# Patient Record
Sex: Male | Born: 1935 | Race: White | Hispanic: No | Marital: Married | Smoking: Former smoker
Health system: Southern US, Community
[De-identification: ages and names within clinical notes are randomized; demographics above are authoritative.]

## PROBLEM LIST (undated history)

## (undated) DIAGNOSIS — J3489 Other specified disorders of nose and nasal sinuses: Secondary | ICD-10-CM

## (undated) DIAGNOSIS — I10 Essential (primary) hypertension: Secondary | ICD-10-CM

## (undated) DIAGNOSIS — F0390 Unspecified dementia without behavioral disturbance: Secondary | ICD-10-CM

## (undated) DIAGNOSIS — K449 Diaphragmatic hernia without obstruction or gangrene: Secondary | ICD-10-CM

## (undated) DIAGNOSIS — D509 Iron deficiency anemia, unspecified: Secondary | ICD-10-CM

## (undated) DIAGNOSIS — E785 Hyperlipidemia, unspecified: Secondary | ICD-10-CM

## (undated) DIAGNOSIS — I639 Cerebral infarction, unspecified: Secondary | ICD-10-CM

## (undated) DIAGNOSIS — I447 Left bundle-branch block, unspecified: Secondary | ICD-10-CM

## (undated) DIAGNOSIS — I951 Orthostatic hypotension: Secondary | ICD-10-CM

## (undated) DIAGNOSIS — I442 Atrioventricular block, complete: Secondary | ICD-10-CM

## (undated) DIAGNOSIS — A770 Spotted fever due to Rickettsia rickettsii: Secondary | ICD-10-CM

## (undated) DIAGNOSIS — I4891 Unspecified atrial fibrillation: Secondary | ICD-10-CM

## (undated) DIAGNOSIS — K219 Gastro-esophageal reflux disease without esophagitis: Secondary | ICD-10-CM

## (undated) DIAGNOSIS — R079 Chest pain, unspecified: Secondary | ICD-10-CM

## (undated) DIAGNOSIS — G4733 Obstructive sleep apnea (adult) (pediatric): Secondary | ICD-10-CM

## (undated) DIAGNOSIS — E119 Type 2 diabetes mellitus without complications: Secondary | ICD-10-CM

## (undated) DIAGNOSIS — C801 Malignant (primary) neoplasm, unspecified: Secondary | ICD-10-CM

## (undated) DIAGNOSIS — D519 Vitamin B12 deficiency anemia, unspecified: Secondary | ICD-10-CM

## (undated) DIAGNOSIS — F419 Anxiety disorder, unspecified: Secondary | ICD-10-CM

## (undated) DIAGNOSIS — Z9989 Dependence on other enabling machines and devices: Secondary | ICD-10-CM

## (undated) DIAGNOSIS — M1990 Unspecified osteoarthritis, unspecified site: Secondary | ICD-10-CM

## (undated) DIAGNOSIS — Z950 Presence of cardiac pacemaker: Secondary | ICD-10-CM

## (undated) DIAGNOSIS — N200 Calculus of kidney: Secondary | ICD-10-CM

## (undated) DIAGNOSIS — Z9981 Dependence on supplemental oxygen: Secondary | ICD-10-CM

## (undated) HISTORY — DX: Iron deficiency anemia, unspecified: D50.9

## (undated) HISTORY — PX: VASECTOMY: SHX75

## (undated) HISTORY — PX: INGUINAL HERNIA REPAIR: SUR1180

## (undated) HISTORY — DX: Calculus of kidney: N20.0

## (undated) HISTORY — PX: CARDIAC CATHETERIZATION: SHX172

## (undated) HISTORY — PX: LUMBAR DISC SURGERY: SHX700

## (undated) HISTORY — DX: Essential (primary) hypertension: I10

## (undated) HISTORY — PX: SKIN CANCER EXCISION: SHX779

## (undated) HISTORY — DX: Cerebral infarction, unspecified: I63.9

## (undated) HISTORY — PX: CATARACT EXTRACTION W/ INTRAOCULAR LENS  IMPLANT, BILATERAL: SHX1307

## (undated) HISTORY — DX: Anxiety disorder, unspecified: F41.9

## (undated) HISTORY — DX: Diaphragmatic hernia without obstruction or gangrene: K44.9

---

## 1998-11-21 ENCOUNTER — Ambulatory Visit (HOSPITAL_COMMUNITY): Admission: RE | Admit: 1998-11-21 | Discharge: 1998-11-21 | Payer: Self-pay | Admitting: *Deleted

## 1999-05-06 ENCOUNTER — Emergency Department (HOSPITAL_COMMUNITY): Admission: EM | Admit: 1999-05-06 | Discharge: 1999-05-06 | Payer: Self-pay | Admitting: Emergency Medicine

## 1999-05-06 ENCOUNTER — Encounter: Payer: Self-pay | Admitting: Emergency Medicine

## 1999-05-10 ENCOUNTER — Encounter: Payer: Self-pay | Admitting: Internal Medicine

## 1999-05-10 ENCOUNTER — Inpatient Hospital Stay (HOSPITAL_COMMUNITY): Admission: EM | Admit: 1999-05-10 | Discharge: 1999-05-13 | Payer: Self-pay | Admitting: Emergency Medicine

## 1999-05-10 ENCOUNTER — Encounter: Payer: Self-pay | Admitting: Emergency Medicine

## 1999-05-15 ENCOUNTER — Encounter: Payer: Self-pay | Admitting: Emergency Medicine

## 1999-05-15 ENCOUNTER — Emergency Department (HOSPITAL_COMMUNITY): Admission: EM | Admit: 1999-05-15 | Discharge: 1999-05-15 | Payer: Self-pay | Admitting: Emergency Medicine

## 1999-05-19 ENCOUNTER — Emergency Department (HOSPITAL_COMMUNITY): Admission: EM | Admit: 1999-05-19 | Discharge: 1999-05-19 | Payer: Self-pay | Admitting: Emergency Medicine

## 2002-01-16 ENCOUNTER — Emergency Department (HOSPITAL_COMMUNITY): Admission: EM | Admit: 2002-01-16 | Discharge: 2002-01-16 | Payer: Self-pay | Admitting: Emergency Medicine

## 2002-01-16 ENCOUNTER — Encounter: Payer: Self-pay | Admitting: Emergency Medicine

## 2002-12-21 ENCOUNTER — Emergency Department (HOSPITAL_COMMUNITY): Admission: EM | Admit: 2002-12-21 | Discharge: 2002-12-21 | Payer: Self-pay | Admitting: Emergency Medicine

## 2002-12-22 ENCOUNTER — Ambulatory Visit (HOSPITAL_COMMUNITY): Admission: RE | Admit: 2002-12-22 | Discharge: 2002-12-22 | Payer: Self-pay | Admitting: Internal Medicine

## 2004-01-24 ENCOUNTER — Inpatient Hospital Stay (HOSPITAL_BASED_OUTPATIENT_CLINIC_OR_DEPARTMENT_OTHER): Admission: RE | Admit: 2004-01-24 | Discharge: 2004-01-24 | Payer: Self-pay | Admitting: Cardiovascular Disease

## 2004-05-10 ENCOUNTER — Encounter: Admission: RE | Admit: 2004-05-10 | Discharge: 2004-05-10 | Payer: Self-pay | Admitting: Internal Medicine

## 2004-06-28 ENCOUNTER — Ambulatory Visit: Payer: Self-pay | Admitting: Internal Medicine

## 2004-06-28 ENCOUNTER — Inpatient Hospital Stay (HOSPITAL_COMMUNITY): Admission: EM | Admit: 2004-06-28 | Discharge: 2004-07-01 | Payer: Self-pay | Admitting: Internal Medicine

## 2004-07-01 ENCOUNTER — Encounter (INDEPENDENT_AMBULATORY_CARE_PROVIDER_SITE_OTHER): Payer: Self-pay | Admitting: Specialist

## 2004-07-10 ENCOUNTER — Ambulatory Visit: Payer: Self-pay | Admitting: Internal Medicine

## 2007-07-09 ENCOUNTER — Encounter: Admission: RE | Admit: 2007-07-09 | Discharge: 2007-07-09 | Payer: Self-pay | Admitting: Sports Medicine

## 2007-08-10 ENCOUNTER — Other Ambulatory Visit: Admission: RE | Admit: 2007-08-10 | Discharge: 2007-08-10 | Payer: Self-pay | Admitting: Otolaryngology

## 2007-08-10 ENCOUNTER — Encounter: Admission: RE | Admit: 2007-08-10 | Discharge: 2007-08-10 | Payer: Self-pay | Admitting: Otolaryngology

## 2007-10-21 ENCOUNTER — Ambulatory Visit (HOSPITAL_COMMUNITY): Admission: RE | Admit: 2007-10-21 | Discharge: 2007-10-21 | Payer: Self-pay | Admitting: Internal Medicine

## 2009-05-24 ENCOUNTER — Ambulatory Visit (HOSPITAL_COMMUNITY): Admission: RE | Admit: 2009-05-24 | Discharge: 2009-05-24 | Payer: Self-pay | Admitting: Ophthalmology

## 2009-08-30 ENCOUNTER — Ambulatory Visit (HOSPITAL_COMMUNITY): Admission: RE | Admit: 2009-08-30 | Discharge: 2009-08-30 | Payer: Self-pay | Admitting: Ophthalmology

## 2010-06-21 ENCOUNTER — Encounter: Admission: RE | Admit: 2010-06-21 | Discharge: 2010-06-21 | Payer: Self-pay | Admitting: Neurosurgery

## 2010-11-10 LAB — BASIC METABOLIC PANEL
CO2: 30 mEq/L (ref 19–32)
Creatinine, Ser: 1.16 mg/dL (ref 0.4–1.5)
GFR calc non Af Amer: >60 mL/min (ref >60)
Sodium: 137 mEq/L (ref 135–145)

## 2010-11-10 LAB — GLUCOSE, CAPILLARY: Glucose-Capillary: 124 mg/dL — ABNORMAL HIGH (ref 70–99)

## 2010-11-10 LAB — HEMOGLOBIN AND HEMATOCRIT, BLOOD: Hemoglobin: 12.3 g/dL — ABNORMAL LOW (ref 13.0–17.0)

## 2010-11-29 LAB — BASIC METABOLIC PANEL
Calcium: 9.3 mg/dL (ref 8.4–10.5)
GFR calc Af Amer: >60 mL/min (ref >60)
Glucose, Bld: 146 mg/dL — ABNORMAL HIGH (ref 70–99)
Sodium: 139 mEq/L (ref 135–145)

## 2010-11-29 LAB — GLUCOSE, CAPILLARY: Glucose-Capillary: 126 mg/dL — ABNORMAL HIGH (ref 70–99)

## 2010-11-29 LAB — HEMOGLOBIN AND HEMATOCRIT, BLOOD: Hemoglobin: 11.7 g/dL — ABNORMAL LOW (ref 13.0–17.0)

## 2011-01-10 NOTE — H&P (Signed)
NAME:  Richard [name], Richard [name] NO.:  [id number]   MEDICAL RECORD NO.:  [id number]                   PATIENT TYPE:  OIB   LOCATION:  NA                                   FACILITY:  [facility]   PHYSICIAN:  Vesta Mixer, M.D.              DATE OF BIRTH:  1931/06/28   DATE OF ADMISSION:  DATE OF DISCHARGE:                                HISTORY & PHYSICAL   HISTORY OF PRESENT ILLNESS:  Mr. Shaff is a 77 year old gentleman who we  are asked to see for further evaluation of some chest pain.   Mr. Hoge has had some chest pain for the past several years.  These  episodes of chest pain have been gradually worsening.  He describes a left-  sided chest heaviness with radiation to the left arm and left shoulder.  He  describes several weeks or several months of decreased energy.  He is not  able to do any specific exercises at this point because of chest pain,  shortness of breath, and severe fatigue.  These episodes of pain last for  many hours a day.  His wife states that he is no longer able to do any of  his normal activities.  He has not had any episodes of syncope.   CURRENT MEDICATIONS:  1. Actos 30 mg a day.  2. Valium 5 mg as needed.  3. Celexa 20 mg a day.  4. Glucovance 5 mg/500 mg once a day.  5. Lasix 40 mg a day.  6. Potassium chloride 20 mEq a day.  7. Lotensin 40 mg a day.  8. Prilosec 20 mg a day.  9. Norvasc 10 mg a day.   ALLERGIES:  He has no known drug allergies.   PAST MEDICAL HISTORY:  1. Diabetes mellitus.  2. Shortness of breath.  3. Hypertension.  4. Hiatal hernia.  5. Anxiety.   PAST SURGICAL HISTORY:  1. History of back surgery.  2. Hernia repair.  3. Vasectomy.   SOCIAL HISTORY:  The patient is retired from [employer] [address]  He  smoked a pack a day for 20 years, but quit approximately 25 years ago.  He  does not drink any alcohol.   FAMILY HISTORY:  His father died at age 67 due to old age.  His mother died  at age 34 due to old age.   REVIEW OF SYSTEMS:  His review of systems was reviewed and is essentially  negative.   PHYSICAL EXAMINATION:  GENERAL APPEARANCE:  He is an elderly gentleman in no  acute distress.  He is alert and oriented x 3.  His mood and affect are  normal.  WEIGHT:  167 pounds.  VITAL SIGNS:  His blood pressure is 120/80 with a heart rate of 88.  NECK:  2+ carotids.  He has no bruits.  There is no JVD and no thyromegaly.  LUNGS:  Clear to  auscultation.  HEART:  Regular rate S1 and S2.  He has no murmurs, rubs, or gallops.  ABDOMEN:  Good bowel sounds.  Nontender.  EXTREMITIES:  He has no cyanosis, clubbing, or edema.  NEUROLOGIC:  Exam was nonfocal.   LABORATORY DATA:  His EKG reveals normal sinus rhythm.  There are no acute  ST-T wave changes.   IMPRESSION:  Mr. Rigor presents with symptoms that are very consistent  with unstable angina.  I have recommended that we proceed with heart  catheterization.  We have discussed the risks, benefits, and options of  heart catheterization.  He understands and agrees to proceed.                                                Vesta Mixer, M.D.    PJN/MEDQ  D:  01/23/2004  T:  01/23/2004  Job:  [id number]   cc:   Larina Earthly, M.D.  [address]  [place]  [place] [address]  Fax: [phone]

## 2011-01-10 NOTE — Cardiovascular Report (Signed)
NAME:  Richard [name], Richard [name] NO.:  [id number]   MEDICAL RECORD NO.:  [id number]                   PATIENT TYPE:  OIB   LOCATION:  6501                                 FACILITY:  [facility]   PHYSICIAN:  Vesta Mixer, M.D.              DATE OF BIRTH:  [birth year]   DATE OF PROCEDURE:  01/24/2004  DATE OF DISCHARGE:                              CARDIAC CATHETERIZATION   [name] is a 77 year old gentleman with a history of hypertension and  diabetes mellitus.  He presented with classic symptoms of unstable angina.  He was referred for heart catheterization for further evaluation.   PROCEDURE:  Left heart catheterization with coronary angiography.  The right  femoral artery was easily cannulated using a modified Seldinger technique.   HEMODYNAMICS:  LV pressure was 123/11 with an aortic pressure of 123/73.   ANGIOGRAPHY:  1. Left main.  The left main is relatively smooth and normal.  2. LAD.  The left anterior descending artery has minor luminal     irregularities.  There are no stenoses greater than 10-20%.  There are     several small diagonal branches.  3. The ramus intermediate branch is large and normal.  4. The left circumflex artery is a fairly large vessel.  It supplies the     posterolateral branch which is essentially normal.  5. The right coronary artery is very large and is normal.  There are a few     minor luminal irregularities.  The posterior descending artery and the     posterolateral segment artery are unremarkable.  6. The left ventriculogram was performed in a 30 RAO position.  It reveals     overall normal left ventricular systolic function.  Ejection fraction is     approximately 65%.  There is no mitral regurgitation.   COMPLICATIONS:  None.   CONCLUSIONS:  1. Minimal coronary artery irregularities.  2. Normal left ventricular systolic function.  We will continue with a good     diet and exercise program.                             Vesta Mixer, M.D.    PJN/MEDQ  D:  01/24/2004  T:  01/24/2004  Job:  [id number]   cc:   Larina Earthly, M.D.  [address]  [place]  [place] [address]  Fax: [phone]

## 2011-01-10 NOTE — H&P (Signed)
NAMEGABRIELA, GIANNELLI NO.:  [id number]   MEDICAL RECORD NO.:  [id number]          PATIENT TYPE:  INP   LOCATION:  0474                         FACILITY:  [facility]   PHYSICIAN:  Iva Boop, M.D. LHCDATE OF BIRTH:  Jun 09, 1931   DATE OF ADMISSION:  06/28/2004  DATE OF DISCHARGE:                                HISTORY & PHYSICAL   CHIEF COMPLAINT:  Shortness of breath and anemia.   HISTORY:  Mr. Faulkenberry is a pleasant 77 year old white male who was found to  be anemic recently.  He has been complaining of progressive fatigue and  dyspnea on exertion to his primary care physician, Dr. Larina Earthly.  On  June 24, 2004, he had a hemoglobin of 8.5 with an MCV of 75, hematocrit  27.6.  He has been found to have Hemoccult-positive stools.  A while ago, he  thinks he had some melena but not lately.  That was perhaps several weeks  ago.  I have reviewed the office records from Dr. Felipa Eth and have spoken to  the patient as well.  The patient has complained of some gas and bloating.  He has weakness and severe shortness of breath.  He was given the option of  trying some iron versus a transfusion, and he was concerned about the risks  of transfusion and opted for iron the other day; however, he continues to do  poorly, feels worse, and has some lightheadedness upon arising from a  sitting or lying position.  He had been on some p.r.n. diclofenac.  He had  been taking aspirin a day, but he has held those.  He was on Nexium and had  that increase from 1 to twice daily recently.  Other recent problems include  an MRI of the lumbar spine that was showing laminectomy changes at L2-3 and  L3-4, and an asymmetric posterior annular bulge and spur formation at L5-S1  and abutting and descending left S1 root as well as flattening of the thecal  sac.  I think he is being considered for possible neurosurgical evaluation.  The patient denies nausea, vomiting, or dysphagia.  His  gastrointestinal  history is pertinent for a known large hiatal hernia.  His last upper  endoscopy was by Dr. Sabino Gasser in 2000 that demonstrated changes that were  apparently consistent with Barrett's esophagus, although I do not have  pathology results on that.  His primary gastroenterologist through the years  has generally been Dr. Victorino Dike.  He had performed upper endoscopy and  colonoscopy in 1996 that demonstrated a large hiatal hernia and some  gastritis and esophagitis changes as well as a colonoscopy with diminutive  polyps, diverticulosis, and hemorrhoids.  There is a remote history of  bleeding that was thought to be due to hemorrhoids.  He does have a  redundant colon.  The patient's appetite has been okay, and there are no  other significant constitutional symptoms other than those things mentioned  above.   MEDICATIONS:  1.  Actos 30 mg q.d.  2.  Glyburide 5/500 mg q.d.  3.  Diazepam 5 mg  q.d.  4.  Citalopram 20 mg q.d.  5.  Benazepril HCl 40 mg q.d.  6.  Norvasc 10 mg q.d.  7.  Nexium 40 mg b.i.d.  8.  Aspirin 81 mg q.d., on hold.  9.  Ferrex 150 mg q.d.  10. Vitamin B12 injection once a month.  11. Diclofenac 75 mg p.r.n., not using.  12. Lasix 40 mg p.r.n.  13. Potassium p.r.n.   DRUG ALLERGIES:  None known.   PAST MEDICAL HISTORY:  1.  Diabetes mellitus type 2.  2.  Hypertension.  3.  Anxiety.  4.  Nephrolithiasis.  5.  Sleep apnea.  6.  Hernia repair.  7.  Lumbar laminectomy.  8.  Cardiac catheterization by Dr. Elease Hashimoto, January 24, 2004, that shows minimal      coronary artery irregularities and normal left ventricular function.  9.  History of vasectomy.   SOCIAL HISTORY:  The patient is retired from [employer].  Prior smoker.  Quit many years ago.  No alcohol reported.  He is here with  his wife today.  One son and one daughter.   REVIEW OF SYSTEMS:  Eye glasses.  Pedal edema.  A lot of fatigue.  Difficulty hearing.  Back pain.   Otherwise as above.  All other systems are  negative otherwise.   PHYSICAL EXAMINATION:  VITAL SIGNS:  Height 6 feet 4.  Weight 268 pounds.  Blood pressure 128/60, pulse 100 and regular.  GENERAL:  A pleasant elderly white male who looks mildly ill.  He is in no  acute distress.  HEENT:  The eyes show pale conjunctivae, otherwise anicteric.  The mouth and  posterior pharynx are free of lesions, although the mucosa is a bit pale.  NECK:  Supple without thyromegaly or mass.  LUNGS:  Clear.  HEART:  S1 and S2.  I hear no murmurs, rubs or gallops.  I see no jugular  venous distention.  ABDOMEN:  Soft, nontender without organomegaly or mass.  RECTAL:  Brown, Hemoccult-negative stool.  GU:  Prostate smooth.  Not enlarged.  LYMPH NODES:  No neck or supraclavicular nodes.  EXTREMITIES:  Trace to 1+ peripheral edema in the bilateral pretibial areas.  SKIN:  Signs of aging.  Pale.  No acute rash.  NEUROLOGIC:  He is alert and oriented x 3.  Cranial nerves II-XII grossly  intact.   ASSESSMENT:  This man has severe symptomatic anemia, Hemoccult positive  stool, and a microcytic anemia as well that must be iron deficiency.  Possibilities include his large hiatal hernia and Cameron's erosions, occult  gastrointestinal neoplasm certainly is a possibility as well.  He has other  comorbidities that include diabetes mellitus and hypertension.   PLAN:  Because of his symptoms, (he became weak and dizzy upon arising in  the clinic today).  I think he needs transfusion.  I do not think iron will  take care of this problem.  Thus, I am going to admit him for transfusion  and probable endoscopy and colonoscopy in the next couple of days, depending  on how it works out.  I have explained the risks, benefits and indications  of transfusion, to include AIDS, hepatitis risk, and transfusion reactions.  We will type and cross him for 2 units, admit him to my service.  Dr. Yancey Flemings is  covering over the  weekend for [place].  Further plans pending above.  I will hold  his blood pressure medicines for the time being.  I appreciate the opportunity to care for this patient.      CEG/MEDQ  D:  06/28/2004  T:  06/28/2004  Job:  [id number]   cc:   Larina Earthly, M.D.  [address]  [place]  [place] [address]  Fax: [phone]

## 2011-01-10 NOTE — Discharge Summary (Signed)
NAME:  Richard [name], Richard [name] NO.:  [id number]   MEDICAL RECORD NO.:  [id number]          PATIENT TYPE:  INP   LOCATION:  0474                         FACILITY:  [facility]   PHYSICIAN:  Richard [name], M.D. LHCDATE OF BIRTH:  03-30-1931   DATE OF ADMISSION:  06/28/2004  DATE OF DISCHARGE:  07/01/2004                                 DISCHARGE SUMMARY   ADMISSION DIAGNOSES:  28.  A 77 year old male with shortness of breath and fatigue secondary to      severe anemia.  2.  Hemoccult-positive stool.  3.  Adult-onset diabetes mellitus.  4.  Hypertension.  5.  History of sleep apnea.  6.  Status post hernia repair and lumbar laminectomy.   DISCHARGE DIAGNOSES:  1.  Severe microcytic anemia, improved.  Status post transfusions.  2.  Large hiatal hernia, 10 cm, with no evidence of Cameron erosions, though      possible that he has had erosions responsible for his anemia.  3.  Sigmoid diverticulosis, colon polyps, status post polypectomies.  4.  Small hemorrhoids.  5.  Gastric antral polyp.  6.  Distal esophageal stricture, status post dilation.  7.  Other problems as outlined above.   CONSULTATIONS:  None.   PROCEDURE:  Upper endoscopy with biopsy and colonoscopy with polypectomies.   BRIEF HISTORY:  Richard [name] is a pleasant 77 year old white male, found  recently to be anemic by his primary care physician, Richard [name].  He at  this time has been complaining of progressive fatigue and shortness of  breath on exertion.  On October 31, he had a hemoglobin of 8.5, MCV 75, and  Hemoccult-positive stool.  In retrospect, he thinks that he may have had  some melena a couple of weeks ago but not any in the past several days.  He  was seen by Richard [name] and given the option of trying some iron  supplementation versus transfusions.  Had been initially concerned about the  risks of transfusions and opted for iron; however, in the interim, he  continues to feel poorly and has been  feeling somewhat worse with  lightheadedness on arising and severe fatigue.  He had called back, and at  this time, arrangements were made for admission for transfusions and then  further diagnostic evaluation, to include colonoscopy and endoscopy.   LABORATORY STUDIES:  On June 28, 2004, WBC 4.6, hemoglobin 7.7,  hematocrit 23.7, MCV 76, platelets 268.  Serial values were obtained post  transfusion on November 7:  Hemoglobin up to 9.4, hematocrit 29.1.  Electrolytes within normal limits.  BUN 17, creatinine 1.3.  Colon biopsies:  A polyp at the appendiceal orifice, hyperplastic polyp, rectosigmoid polyp,  hyperplastic.  Gastric polyp, hyperplastic.   HOSPITAL COURSE:  Patient was admitted to the service of Richard [name].  His hemoglobin was 7.7 on admission, and he was transfused 2 units of packed  RBCs, which did improve his symptoms.  He was then scheduled for colonoscopy  and upper endoscopy with Richard [name] on the 7th.  He had colon prep on the  6th and tolerated this  without difficulty.   Colonoscopy showed several colon polyps, all of which were removed, and path  is consistent with hyperplastic polyps.  He had grade 1 internal hemorrhoids  and sigmoid colon diverticulosis.  Upper endoscopy showed a huge hiatal  hernia, approximately 10 cm.  There were no Cameron erosions seen.  He had a  12 mm firm polypoid lesion with erythematous mucosa in the gastric antrum.  This was biopsied.  He had a distal esophageal stricture, which was dilated  to 18 mm.  He was feeling well post procedures and was stable for discharge  to home.  Though no Cameron erosions were seen, it was felt that with his  huge hiatal hernia, this certainly could be a potential source for slow GI  blood loss.   He was placed on iron supplement, Ferrex t.i.d.  He was to avoid all aspirin  and anti-inflammatories post polypectomy for two weeks.  He was changed to  Nexium 40 mg q.d. and was to continue his  other regular medications,  including Actos, Glyburide, Diazepam, Acetazolam, Benazepril, Norvasc, EPO  injections monthly, Lasix 40 p.r.n., and potassium p.r.n.   He is to follow up with Richard [name] on November 16th at 9 a.m. and to call  for any problems in the interim.   DISCHARGE DIET:  Diabetic with low roughage.   CONDITION ON DISCHARGE:  Stable and improved.      AE/MEDQ  D:  07/25/2004  T:  07/25/2004  Job:  [id number]   cc:   Larina [name], M.D.  [address]  [place]  [place] [address]  Fax: [phone]

## 2011-06-06 ENCOUNTER — Emergency Department (HOSPITAL_COMMUNITY): Payer: Medicare Other

## 2011-06-06 ENCOUNTER — Emergency Department (HOSPITAL_COMMUNITY)
Admission: EM | Admit: 2011-06-06 | Discharge: 2011-06-06 | Disposition: A | Discharge status: Home / Self Care | Payer: Medicare Other | Attending: Emergency Medicine | Admitting: Emergency Medicine

## 2011-06-06 DIAGNOSIS — I10 Essential (primary) hypertension: Secondary | ICD-10-CM | POA: Insufficient documentation

## 2011-06-06 DIAGNOSIS — M549 Dorsalgia, unspecified: Secondary | ICD-10-CM | POA: Insufficient documentation

## 2011-06-06 DIAGNOSIS — K219 Gastro-esophageal reflux disease without esophagitis: Secondary | ICD-10-CM | POA: Insufficient documentation

## 2011-06-06 DIAGNOSIS — R072 Precordial pain: Secondary | ICD-10-CM | POA: Insufficient documentation

## 2011-06-06 DIAGNOSIS — I447 Left bundle-branch block, unspecified: Secondary | ICD-10-CM | POA: Insufficient documentation

## 2011-06-06 DIAGNOSIS — K449 Diaphragmatic hernia without obstruction or gangrene: Secondary | ICD-10-CM | POA: Insufficient documentation

## 2011-06-06 DIAGNOSIS — E119 Type 2 diabetes mellitus without complications: Secondary | ICD-10-CM | POA: Insufficient documentation

## 2011-06-06 DIAGNOSIS — R0602 Shortness of breath: Secondary | ICD-10-CM | POA: Insufficient documentation

## 2011-06-06 DIAGNOSIS — I2510 Atherosclerotic heart disease of native coronary artery without angina pectoris: Secondary | ICD-10-CM | POA: Insufficient documentation

## 2011-06-06 LAB — POCT I-STAT TROPONIN I

## 2011-06-06 LAB — BASIC METABOLIC PANEL
CO2: 27 mEq/L (ref 19–32)
Glucose, Bld: 128 mg/dL — ABNORMAL HIGH (ref 70–99)
Potassium: 4.6 mEq/L (ref 3.5–5.1)
Sodium: 133 mEq/L — ABNORMAL LOW (ref 135–145)

## 2011-06-06 LAB — GLUCOSE, CAPILLARY

## 2011-06-06 LAB — DIFFERENTIAL
Basophils Absolute: 0 10*3/uL (ref 0.0–0.1)
Basophils Relative: 1 % (ref 0–1)
Lymphocytes Relative: 24 % (ref 12–46)
Lymphs Abs: 1 10*3/uL (ref 0.7–4.0)
Monocytes Absolute: 0.5 10*3/uL (ref 0.1–1.0)

## 2011-06-06 LAB — PROTIME-INR: INR: 1 (ref 0.00–1.49)

## 2011-06-06 LAB — CBC
Hemoglobin: 11.3 g/dL — ABNORMAL LOW (ref 13.0–17.0)
RBC: 3.89 MIL/uL — ABNORMAL LOW (ref 4.22–5.81)
WBC: 4.1 10*3/uL (ref 4.0–10.5)

## 2011-06-06 LAB — APTT: aPTT: 28 seconds (ref 24–37)

## 2011-06-06 MED ORDER — IOHEXOL 300 MG/ML  SOLN
100.0000 mL | Freq: Once | INTRAMUSCULAR | Status: AC | PRN
  Administered 2011-06-06: 100 mL via INTRAVENOUS

## 2011-06-11 ENCOUNTER — Telehealth: Payer: Self-pay | Admitting: Cardiovascular Disease

## 2011-06-11 NOTE — Telephone Encounter (Signed)
Pt wife calls to make post ED follow-up appt with Dr. Elease Hashimoto.  He was seen last Friday in the [facility] ED with c/o chest pain & arm pain. His wife states he "checked out negative and was clear to go on vacation but, Dr. Felipa Eth said he should be seen soon with Dr. Elease Hashimoto". They are back from vacation.  Appt  Made with Dr. Elease Hashimoto for this Friday. Wife is aware. Mylo Red RN

## 2011-06-11 NOTE — Telephone Encounter (Signed)
Pt was having chest pain and arm pain last Friday and they wanted him to schedule appt with dr Elease Hashimoto. They went on vacation and cam back today Now they want to schedule appt as soon as possible please call

## 2011-06-12 ENCOUNTER — Encounter: Payer: Self-pay | Admitting: Cardiovascular Disease

## 2011-06-12 ENCOUNTER — Encounter: Payer: Self-pay | Admitting: *Deleted

## 2011-06-13 ENCOUNTER — Encounter: Payer: Self-pay | Admitting: Cardiovascular Disease

## 2011-06-13 ENCOUNTER — Ambulatory Visit (INDEPENDENT_AMBULATORY_CARE_PROVIDER_SITE_OTHER): Payer: Medicare Other | Admitting: Cardiovascular Disease

## 2011-06-13 DIAGNOSIS — G4730 Sleep apnea, unspecified: Secondary | ICD-10-CM

## 2011-06-13 DIAGNOSIS — G4733 Obstructive sleep apnea (adult) (pediatric): Secondary | ICD-10-CM | POA: Insufficient documentation

## 2011-06-13 DIAGNOSIS — R079 Chest pain, unspecified: Secondary | ICD-10-CM

## 2011-06-13 DIAGNOSIS — I447 Left bundle-branch block, unspecified: Secondary | ICD-10-CM

## 2011-06-13 DIAGNOSIS — I10 Essential (primary) hypertension: Secondary | ICD-10-CM | POA: Insufficient documentation

## 2011-06-13 NOTE — Patient Instructions (Addendum)
Your physician wants you to follow-up in: 6 months You will receive a reminder letter in the mail two months in advance. If you don't receive a letter, please call our office to schedule the follow-up appointment.  Your physician has requested that you have a lexiscan myoview. For further information please visit [email or web address] Please follow instruction sheet, as given.2 DAY PROTICOL

## 2011-06-13 NOTE — Progress Notes (Signed)
Fuller Plan Date of Birth  [birth year] [facility] [address]    [address] [place], [place]  [address] [phone]  Fax  [phone]  History of Present Illness:  Richard [name] is a 77 year old old gentleman with a history of left bundle branch block. He has a history of hypertension. He's had some episodes of chest pain. He was recently seen in the emergency room with episodes of chest pain. He had multiple troponin levels which were normal. He has CT angiogram which was negative for a PE. He was not have a very large hiatal hernia.  He had a normal cardiac catheterization in 2005. He had a normal stress Myoview study in 2009.  Current Outpatient Prescriptions on File Prior to Visit  Medication Sig Dispense Refill  . amLODipine (NORVASC) 5 MG tablet Take 5 mg by mouth daily.        . benazepril (LOTENSIN) 40 MG tablet Take 40 mg by mouth daily.        . carvedilol (COREG) 25 MG tablet Take 25 mg by mouth 2 (two) times daily with a meal.        . Cyanocobalamin (VITAMIN B-12 IJ) Inject as directed every 30 (thirty) days.        . diazepam (VALIUM) 5 MG tablet Take 5 mg by mouth at bedtime.        . fluticasone (FLONASE) 50 MCG/ACT nasal spray Place 1 spray into the nose daily.        Marland Kitchen METFORMIN HCL PO Take 50 mg by mouth 2 (two) times daily.        Marland Kitchen omeprazole (PRILOSEC) 20 MG capsule Take 20 mg by mouth daily.        . pioglitazone (ACTOS) 30 MG tablet Take 30 mg by mouth daily.          Allergies  Allergen Reactions  . Codeine     Past Medical History  Diagnosis Date  . Diabetes mellitus     Type 2  . Hypertension   . Anxiety   . Nephrolithiasis   . Sleep apnea   . Microcytic anemia     as well that must be iron deficiency  . Chest pain   . Hiatal hernia     Past Surgical History  Procedure Date  . Hernia repair   . Cardiac catheterization     by Dr. Elease Hashimoto, January 24, 2004, that shows minimal coronary artery irregularities and normal left ventricular  function  . Vasectomy     Hx of     History  Smoking status  . Former Smoker  Smokeless tobacco  . Not on file    History  Alcohol Use No    History reviewed. No pertinent family history.  Reviw of Systems:  Reviewed in the HPI.  All other systems are negative.  Physical Exam: BP 123/72  Pulse 64  Ht 6\' 4"  (1.93 m)  Wt 267 lb 6.4 oz (121.292 kg)  BMI 32.55 kg/m2 The patient is alert and oriented x 3.  The mood and affect are normal.   Skin: warm and dry.  Color is normal.    HEENT:   Normal carotids, no JVD  Lungs: clear   Heart: RR, no murmur    Abdomen: nt. Normal BS  Extremities:  Trace edema in Right leg, minimal in left.  He has a moderate size hematoma in his left lower leg. He developed this after a fall.  Neuro:  Non focal  ECG: Normal sinus rhythm. Left bundle branch block.  Assessment / Plan:

## 2011-06-13 NOTE — Assessment & Plan Note (Signed)
He presented to the emergency room several weeks ago with severe episodes of chest pain. The pain was also intrascapular. It caused him to be short of breath. He had a negative CT angiogram which revealed no evidence of pulmonary and was.  He's had a normal heart catheterization approximately 7 years ago and has had a normal Myoview study 2 years ago.  I think that we should repeat his Myoview study as a two-day Lexiscan study. I'll see him in followup in 6 months.

## 2011-06-13 NOTE — Assessment & Plan Note (Signed)
His blood pressure is fairly well controlled. He has a history of sleep apnea and obesity. I've asked him to watch his diet and exercise as much as possible. He will continue to wear his sleep mask.

## 2011-06-19 ENCOUNTER — Other Ambulatory Visit: Payer: Self-pay | Admitting: Internal Medicine

## 2011-06-19 DIAGNOSIS — R0602 Shortness of breath: Secondary | ICD-10-CM

## 2011-06-20 ENCOUNTER — Ambulatory Visit
Admission: RE | Admit: 2011-06-20 | Discharge: 2011-06-20 | Disposition: A | Discharge status: Home / Self Care | Payer: No Typology Code available for payment source | Source: Ambulatory Visit | Attending: Internal Medicine | Admitting: Internal Medicine

## 2011-06-20 ENCOUNTER — Ambulatory Visit
Admission: RE | Admit: 2011-06-20 | Discharge: 2011-06-20 | Disposition: A | Discharge status: Home / Self Care | Payer: Medicare Other | Source: Ambulatory Visit | Attending: Internal Medicine | Admitting: Internal Medicine

## 2011-06-20 DIAGNOSIS — R0602 Shortness of breath: Secondary | ICD-10-CM

## 2011-06-24 ENCOUNTER — Other Ambulatory Visit (HOSPITAL_COMMUNITY): Payer: Self-pay | Admitting: Radiology

## 2011-06-24 DIAGNOSIS — I447 Left bundle-branch block, unspecified: Secondary | ICD-10-CM

## 2011-06-24 DIAGNOSIS — R079 Chest pain, unspecified: Secondary | ICD-10-CM

## 2011-06-25 ENCOUNTER — Ambulatory Visit (HOSPITAL_COMMUNITY): Payer: Medicare Other | Attending: Cardiovascular Disease | Admitting: Radiology

## 2011-06-25 ENCOUNTER — Encounter: Payer: Self-pay | Admitting: Cardiology

## 2011-06-25 DIAGNOSIS — R079 Chest pain, unspecified: Secondary | ICD-10-CM | POA: Insufficient documentation

## 2011-06-25 DIAGNOSIS — I447 Left bundle-branch block, unspecified: Secondary | ICD-10-CM | POA: Insufficient documentation

## 2011-06-25 DIAGNOSIS — I4949 Other premature depolarization: Secondary | ICD-10-CM

## 2011-06-25 DIAGNOSIS — R0789 Other chest pain: Secondary | ICD-10-CM

## 2011-06-25 MED ORDER — REGADENOSON 0.4 MG/5ML IV SOLN
0.4000 mg | Freq: Once | INTRAVENOUS | Status: AC
  Administered 2011-06-25: 0.4 mg via INTRAVENOUS

## 2011-06-25 MED ORDER — TECHNETIUM TC 99M TETROFOSMIN IV KIT
33.0000 | PACK | Freq: Once | INTRAVENOUS | Status: AC | PRN
  Administered 2011-06-25: 33 via INTRAVENOUS

## 2011-06-25 NOTE — Progress Notes (Signed)
[facility] SITE 3 NUCLEAR MED [address] [place] [address] [phone]  Cardiology Nuclear Med Study  Richard [name] is a 77 y.o. male [id number] [birth year]   Nuclear Med Background Indication for Stress Test:  Evaluation for Ischemia and 06/06/11 Post Hospital:Chest Pain, (-) enzymes History: 12/09 Echo:EF 60-65%, '05 Heart Catheterization: N/O Dz EF: 65% and '09 Myocardial Perfusion Study:'09 NL EF 58% Cardiac Risk Factors: History of Smoking, Hypertension, LBBB and NIDDM  Symptoms:  Chest Pain, Chest Pressure, Chest Tightness, and Palpitations   Nuclear Pre-Procedure Caffeine/Decaff Intake:  None NPO After: 2:00am   Lungs:  clear IV 0.9% NS with Angio Cath:  20g  IV Site: R Antecubital x 1, tolerated well IV Started by:  Irean Hong, RN  Chest Size (in):  52 Cup Size: n/a  Height: 6\' 4"  (1.93 m)  Weight:  265 lb (120.203 kg)  BMI:  Body mass index is 32.26 kg/(m^2). Tech Comments:  N/A    Nuclear Med Study 1 or 2 day study: 2 day  Stress Test Type:  Adenosine  Reading MD: Arvilla Meres, MD  Order Authorizing Provider:  Kristeen Miss, MD  Resting Radionuclide: Technetium 39m Tetrofosmin  Resting Radionuclide Dose: 33.0 mCi   Stress Radionuclide:  Technetium 87m Tetrofosmin  Stress Radionuclide Dose: 33.0 mCi           Stress Protocol Rest HR: 60 Stress HR: 81  Rest BP: 120/69 Stress BP: 137/70  Exercise Time (min): n/a METS: n/a   Predicted Max HR: 142 bpm % Max HR: 57.04 bpm Rate Pressure Product: 72536   Dose of Adenosine (mg):  60.0 Dose of Lexiscan: n/a mg  Dose of Atropine (mg): n/a Dose of Dobutamine: n/a mcg/kg/min (at max HR)  Stress Test Technologist: Milana Na, EMT-P  Nuclear Technologist:  Domenic Polite, CNMT     Rest Procedure:  Myocardial perfusion imaging was performed at rest 45 minutes following the intravenous administration of Technetium 50m Tetrofosmin. Rest ECG: NSR  Stress Procedure:  The patient  received IV adenosine at 140 mcg/kg/min for 4 minutes.  There were no significant changes, + chest tightness, abdominal tightness, and rare pvcs with infusion.  Technetium 69m Tetrofosmin was injected at the 2 minute mark and quantitative spect images were obtained after a 45 minute delay. Stress ECG: Uninteretable due to baseline LBBB  QPS Raw Data Images:  There is a large area of intense tracer uptake either in the upper left chest behind the heart.  Stress Images:  Normal homogeneous uptake in all areas of the myocardium. Rest Images:  Normal homogeneous uptake in all areas of the myocardium. Subtraction (SDS):  Normal Transient Ischemic Dilatation (Normal <1.22):  1.04 Lung/Heart Ratio (Normal <0.45):  0.39  Quantitative Gated Spect Images QGS EDV:  131 ml QGS ESV:  53 ml QGS cine images:  NL LV Function; NL Wall Motion QGS EF: 60%  Impression Exercise Capacity:  Adenosine study with no exercise. BP Response:  n/a Clinical Symptoms:  n/a ECG Impression:  Baseline:  LBBB.  EKG uninterpretable due to LBBB at rest and stress. Comparison with Prior Nuclear Study: No images to compare  Overall Impression:  Normal stress nuclear study. There is a large area of intense tracer uptake either in the upper left chest behind the heart of uncertain significance. Consider chest imaging       Arvilla Meres

## 2011-07-01 ENCOUNTER — Ambulatory Visit (HOSPITAL_COMMUNITY): Payer: Medicare Other | Attending: Internal Medicine | Admitting: Radiology

## 2011-07-01 DIAGNOSIS — R0989 Other specified symptoms and signs involving the circulatory and respiratory systems: Secondary | ICD-10-CM

## 2011-07-01 MED ORDER — TECHNETIUM TC 99M TETROFOSMIN IV KIT
33.0000 | PACK | Freq: Once | INTRAVENOUS | Status: AC | PRN
  Administered 2011-07-01: 33 via INTRAVENOUS

## 2011-07-04 ENCOUNTER — Other Ambulatory Visit: Payer: No Typology Code available for payment source

## 2011-07-10 ENCOUNTER — Encounter: Payer: Self-pay | Admitting: Cardiovascular Disease

## 2011-09-23 DIAGNOSIS — E538 Deficiency of other specified B group vitamins: Secondary | ICD-10-CM | POA: Diagnosis not present

## 2011-10-23 DIAGNOSIS — E538 Deficiency of other specified B group vitamins: Secondary | ICD-10-CM | POA: Diagnosis not present

## 2011-11-13 DIAGNOSIS — K219 Gastro-esophageal reflux disease without esophagitis: Secondary | ICD-10-CM | POA: Diagnosis not present

## 2011-11-13 DIAGNOSIS — F411 Generalized anxiety disorder: Secondary | ICD-10-CM | POA: Diagnosis not present

## 2011-11-13 DIAGNOSIS — I10 Essential (primary) hypertension: Secondary | ICD-10-CM | POA: Diagnosis not present

## 2011-11-13 DIAGNOSIS — E119 Type 2 diabetes mellitus without complications: Secondary | ICD-10-CM | POA: Diagnosis not present

## 2011-11-25 DIAGNOSIS — E538 Deficiency of other specified B group vitamins: Secondary | ICD-10-CM | POA: Diagnosis not present

## 2011-12-08 DIAGNOSIS — M48061 Spinal stenosis, lumbar region without neurogenic claudication: Secondary | ICD-10-CM | POA: Diagnosis not present

## 2011-12-23 DIAGNOSIS — M48061 Spinal stenosis, lumbar region without neurogenic claudication: Secondary | ICD-10-CM | POA: Diagnosis not present

## 2011-12-24 DIAGNOSIS — E538 Deficiency of other specified B group vitamins: Secondary | ICD-10-CM | POA: Diagnosis not present

## 2012-01-06 DIAGNOSIS — M48061 Spinal stenosis, lumbar region without neurogenic claudication: Secondary | ICD-10-CM | POA: Diagnosis not present

## 2012-01-28 DIAGNOSIS — E538 Deficiency of other specified B group vitamins: Secondary | ICD-10-CM | POA: Diagnosis not present

## 2012-03-03 DIAGNOSIS — R209 Unspecified disturbances of skin sensation: Secondary | ICD-10-CM | POA: Diagnosis not present

## 2012-03-03 DIAGNOSIS — M545 Low back pain: Secondary | ICD-10-CM | POA: Diagnosis not present

## 2012-03-09 DIAGNOSIS — E538 Deficiency of other specified B group vitamins: Secondary | ICD-10-CM | POA: Diagnosis not present

## 2012-03-15 DIAGNOSIS — E119 Type 2 diabetes mellitus without complications: Secondary | ICD-10-CM | POA: Diagnosis not present

## 2012-03-15 DIAGNOSIS — I10 Essential (primary) hypertension: Secondary | ICD-10-CM | POA: Diagnosis not present

## 2012-03-15 DIAGNOSIS — IMO0002 Reserved for concepts with insufficient information to code with codable children: Secondary | ICD-10-CM | POA: Diagnosis not present

## 2012-03-15 DIAGNOSIS — F411 Generalized anxiety disorder: Secondary | ICD-10-CM | POA: Diagnosis not present

## 2012-03-17 DIAGNOSIS — H35379 Puckering of macula, unspecified eye: Secondary | ICD-10-CM | POA: Diagnosis not present

## 2012-03-17 DIAGNOSIS — E119 Type 2 diabetes mellitus without complications: Secondary | ICD-10-CM | POA: Diagnosis not present

## 2012-03-17 DIAGNOSIS — Z961 Presence of intraocular lens: Secondary | ICD-10-CM | POA: Diagnosis not present

## 2012-03-17 DIAGNOSIS — H02409 Unspecified ptosis of unspecified eyelid: Secondary | ICD-10-CM | POA: Diagnosis not present

## 2012-03-22 DIAGNOSIS — M545 Low back pain: Secondary | ICD-10-CM | POA: Diagnosis not present

## 2012-04-06 DIAGNOSIS — M79609 Pain in unspecified limb: Secondary | ICD-10-CM | POA: Diagnosis not present

## 2012-04-06 DIAGNOSIS — M545 Low back pain: Secondary | ICD-10-CM | POA: Diagnosis not present

## 2012-04-08 DIAGNOSIS — E538 Deficiency of other specified B group vitamins: Secondary | ICD-10-CM | POA: Diagnosis not present

## 2012-04-22 DIAGNOSIS — H01009 Unspecified blepharitis unspecified eye, unspecified eyelid: Secondary | ICD-10-CM | POA: Diagnosis not present

## 2012-05-07 DIAGNOSIS — M545 Low back pain: Secondary | ICD-10-CM | POA: Diagnosis not present

## 2012-05-11 DIAGNOSIS — E538 Deficiency of other specified B group vitamins: Secondary | ICD-10-CM | POA: Diagnosis not present

## 2012-05-11 DIAGNOSIS — Z23 Encounter for immunization: Secondary | ICD-10-CM | POA: Diagnosis not present

## 2012-05-24 ENCOUNTER — Ambulatory Visit (INDEPENDENT_AMBULATORY_CARE_PROVIDER_SITE_OTHER): Payer: Medicare Other | Admitting: Nurse Practitioner

## 2012-05-24 ENCOUNTER — Encounter: Payer: Self-pay | Admitting: Nurse Practitioner

## 2012-05-24 VITALS — BP 130/66 | HR 67 | Ht 76.0 in | Wt 275.0 lb

## 2012-05-24 DIAGNOSIS — R079 Chest pain, unspecified: Secondary | ICD-10-CM

## 2012-05-24 NOTE — Patient Instructions (Signed)
We are going to arrange for a stress test  Stay on your current medicines  Keep your visit with Dr. Elease Hashimoto for mid October  Call the [facility] office at [phone] if you have any questions, problems or concerns.

## 2012-05-24 NOTE — Progress Notes (Signed)
Richard [name] Date of Birth: 04-Nov-1930 Medical Record #[id number]  History of Present Illness: Richard [name] is seen today for a work in visit. He is seen for Richard [name]. He is 78 years of age. Has HTN, HLD, DM and GERD. Has a large hiatal hernia. Has had normal cardiac cath in 2005. Negative Myoview last November. Has a chronic L BBB. He remains obese.  He comes in today. He is here with his wife. He says he is having more indigestion. This is a chronic issue but now having discomfort down the left arm. The two seem to be related. Does not seem to be exertional but he admits that he is very limited. He is not able to exercise due to back pain and leg pain. He has DOE. Fatigues very easily. Wife thinks he is more anxious. His arm hurting comes and goes. He does not use NTG. He is concerned. He almost went to the ER the other night.   Current Outpatient Prescriptions on File Prior to Visit  Medication Sig Dispense Refill  . amLODipine (NORVASC) 5 MG tablet Take 5 mg by mouth daily.        . benazepril (LOTENSIN) 40 MG tablet Take 40 mg by mouth daily.        . carvedilol (COREG) 25 MG tablet Take 25 mg by mouth 2 (two) times daily with a meal.        . Cyanocobalamin (VITAMIN B-12 IJ) Inject as directed every 30 (thirty) days.        . diazepam (VALIUM) 5 MG tablet Take 5 mg by mouth at bedtime.        . fluticasone (FLONASE) 50 MCG/ACT nasal spray Place 1 spray into the nose daily.        Marland Kitchen METFORMIN HCL PO Take 50 mg by mouth 2 (two) times daily.        Marland Kitchen omeprazole (PRILOSEC) 20 MG capsule Take 20 mg by mouth daily.        . pioglitazone (ACTOS) 30 MG tablet Take 30 mg by mouth daily.          Allergies  Allergen Reactions  . Codeine     Past Medical History  Diagnosis Date  . Diabetes mellitus     Type 2  . Hypertension   . Anxiety   . Nephrolithiasis   . Sleep apnea   . Microcytic anemia     as well that must be iron deficiency  . Chest pain   . Hiatal hernia     Past  Surgical History  Procedure Date  . Hernia repair   . Cardiac catheterization     by Richard [name], January 24, 2004, that shows minimal coronary artery irregularities and normal left ventricular function  . Vasectomy     Hx of     History  Smoking status  . Former Smoker  Smokeless tobacco  . Not on file    History  Alcohol Use No    History reviewed. No pertinent family history.  Review of Systems: The review of systems is per the HPI.  All other systems were reviewed and are negative.  Physical Exam: BP 130/66  Pulse 67  Ht 6\' 4"  (1.93 m)  Wt 275 lb (124.739 kg)  BMI 33.47 kg/m2 Patient is very pleasant and in no acute distress. He is obese. Affect is a little flat. Skin is warm and dry. Color is normal.  HEENT is unremarkable. Normocephalic/atraumatic. PERRL. Sclera are nonicteric.  Neck is supple. No masses. No JVD. Lungs are clear. Cardiac exam shows a regular rate and rhythm. Abdomen is obese but soft. Extremities are without edema. Gait and ROM are intact. No gross neurologic deficits noted.   LABORATORY DATA: EKG shows sinus with L BBB.  Myoview Overall Impression: November 2012  Normal stress nuclear study. There is a large area of intense tracer uptake either in the upper left chest behind the heart of uncertain significance. Consider chest imaging   Richard [name]  CT CHEST IMPRESSION:OCTOBER 2012  1. No evidence of acute pulmonary embolism. 2. Mild aortic and great vessel atherosclerosis with slightly greater coronary artery disease. 3. Large hiatal hernia. 4. Previously described right infrahilar process appears less concerning and may represent infrahilar nodal tissue. No endobronchial lesion or discrete mass is demonstrated.  CTA ABDOMEN  1. Aortic and visceral atherosclerosis. No evidence of aneurysm or large vessel occlusion. 2. Cholelithiasis. 3. Multiple low-density renal lesions, likely all cysts. 4. No acute abdominal findings  demonstrated.   Assessment / [name]: 1. Atypical chest pain/left arm pain - has multiple CV risk factors - not able to exercise and has worsening fatigue. Has chronic L BBB. Will arrange for Lexiscan. He has follow up planned with Richard [name] for 2 weeks.   2. Hiatal Hernia - remains on PPI therapy. Not able to take aspirin.   3. ? Depression - he is to see Richard [name] soon and I encouraged him to discuss with him.  Patient is agreeable to this [name] and will call if any problems develop in the interim.

## 2012-05-26 ENCOUNTER — Ambulatory Visit (HOSPITAL_COMMUNITY): Payer: Medicare Other | Attending: Nurse Practitioner | Admitting: Radiology

## 2012-05-26 VITALS — BP 140/74 | HR 64 | Ht 76.0 in | Wt 271.0 lb

## 2012-05-26 DIAGNOSIS — I447 Left bundle-branch block, unspecified: Secondary | ICD-10-CM

## 2012-05-26 DIAGNOSIS — R0609 Other forms of dyspnea: Secondary | ICD-10-CM | POA: Insufficient documentation

## 2012-05-26 DIAGNOSIS — I10 Essential (primary) hypertension: Secondary | ICD-10-CM | POA: Insufficient documentation

## 2012-05-26 DIAGNOSIS — E119 Type 2 diabetes mellitus without complications: Secondary | ICD-10-CM | POA: Insufficient documentation

## 2012-05-26 DIAGNOSIS — R079 Chest pain, unspecified: Secondary | ICD-10-CM | POA: Diagnosis not present

## 2012-05-26 DIAGNOSIS — I2510 Atherosclerotic heart disease of native coronary artery without angina pectoris: Secondary | ICD-10-CM

## 2012-05-26 DIAGNOSIS — R0989 Other specified symptoms and signs involving the circulatory and respiratory systems: Secondary | ICD-10-CM | POA: Insufficient documentation

## 2012-05-26 MED ORDER — TECHNETIUM TC 99M SESTAMIBI GENERIC - CARDIOLITE
33.0000 | Freq: Once | INTRAVENOUS | Status: AC | PRN
  Administered 2012-05-26: 33 via INTRAVENOUS

## 2012-05-26 MED ORDER — TECHNETIUM TC 99M SESTAMIBI GENERIC - CARDIOLITE
11.0000 | Freq: Once | INTRAVENOUS | Status: AC | PRN
  Administered 2012-05-26: 11 via INTRAVENOUS

## 2012-05-26 MED ORDER — ADENOSINE (DIAGNOSTIC) 3 MG/ML IV SOLN
0.5600 mg/kg | Freq: Once | INTRAVENOUS | Status: AC
  Administered 2012-05-26: 60 mg via INTRAVENOUS

## 2012-05-26 NOTE — Progress Notes (Signed)
[facility] SITE 3 NUCLEAR MED [id number] [place] [address] [phone]  Cardiology Nuclear Med Study  Richard [name] is a 78 y.o. male     MRN : [id number]     DOB: 1930/09/15  Procedure Date: 05/26/2012  Nuclear Med Background Indication for Stress Test:  Evaluation for Ischemia History:  '05 Cath:n/o CAD, EF=65%; '09 Echo:EF=65%; '12 Chest KG:[id number]; '12 MPS:No ischemia, EF=60% Cardiac Risk Factors: History of Smoking, Hypertension, LBBB, Lipids, NIDDM and Obesity  Symptoms:  Chest Pain (last episode of chest discomfort was Friday, 05/21/12), DOE, Fatigue with and without Exertion   Nuclear Pre-Procedure Caffeine/Decaff Intake:  None > 12 hrs NPO After: 2:00am   Lungs:  Clear. O2 Sat: 95% on room air. IV 0.9% NS with Angio Cath:  22g  IV Site: R Antecubital x 1, tolerated well IV Started by:  Irean Hong, RN  Chest Size (in):  48 Cup Size: n/a  Height: 6\' 4"  (1.93 m)  Weight:  271 lb (122.925 kg)  BMI:  Body mass index is 32.99 kg/(m^2). Tech Comments:  Took Coreg this am, and held diabetic medications this am    Nuclear Med Study 1 or 2 day study: 1 day  Stress Test Type:  Adenosine  Reading MD: Kristeen Miss, MD  Order Authorizing Provider:  Kristeen Miss, MD and Norma Fredrickson, NP  Resting Radionuclide: Technetium 8m Sestamibi  Resting Radionuclide Dose: 11.0 mCi   Stress Radionuclide:  Technetium 9m Sestamibi  Stress Radionuclide Dose: 33.0 mCi           Stress Protocol Rest HR: 64 Stress HR: 78  Rest BP: 140/74 Stress BP: 139/59  Exercise Time (min): n/a METS: n/a   Predicted Max HR: 141 bpm % Max HR: 55.32 bpm Rate Pressure Product: 47425   Dose of Adenosine (mg):  60.0 Dose of Lexiscan: n/a mg  Dose of Atropine (mg): n/a Dose of Dobutamine: n/a   Stress Test Technologist: Smiley Houseman, CMA-N  Nuclear Technologist:  Domenic Polite, CNMT     Rest Procedure:  Myocardial perfusion imaging was performed  at rest 45 minutes following the intravenous administration of Technetium 64m Sestamibi.  Rest ECG: LBBB with occasional PVC's.  Stress Procedure:  The patient received IV adenosine at 140 mcg/kg/min for 4 minutes.  There was a brief episode of 2nd degree AVB with infusion.  He did c/o chest tightness, 8-9/10, with infusion.  Technetium 87m Sestamibi was injected at the 2 minute mark and quantitative spect images were obtained after a 45 minute delay.  Stress ECG: No significant change from baseline ECG  QPS Raw Data Images:  Normal; no motion artifact; normal heart/lung ratio. Stress Images:  There is mildly decreased uptake in the inferior wall and apex with normal perfusion in the other walls. Rest Images:  There is mildly decreased uptake in the inferior wall and apex with normal perfusion in the other walls. Subtraction (SDS):  No evidence of ischemia. Transient Ischemic Dilatation (Normal <1.22):  1.01 Lung/Heart Ratio (Normal <0.45):  0.33  Quantitative Gated Spect Images QGS EDV:  137 ml QGS ESV:  50 ml  Impression Exercise Capacity:  Adenosine study with no exercise. BP Response:  Normal blood pressure response. Clinical Symptoms:  No significant symptoms noted. ECG Impression:  No significant ST segment change suggestive of ischemia. Comparison with Prior Nuclear Study: No images to compare  Overall Impression:  Low risk stress nuclear study.  There is mildly decreased uptake in the inferior wall  that is likely due to diaphragmatic attenuation.  There is no reversible ischemia.  LV Ejection Fraction: 63%.  LV Wall Motion:  NL LV Function; NL Wall Motion.    Vesta Mixer, Montez Hageman., MD, [facility] 05/26/2012, 4:27 PM Office - [phone] Pager [phone]

## 2012-05-28 ENCOUNTER — Encounter: Payer: Self-pay | Admitting: Cardiovascular Disease

## 2012-06-07 ENCOUNTER — Ambulatory Visit: Payer: Medicare Other | Admitting: Cardiovascular Disease

## 2012-06-10 DIAGNOSIS — E538 Deficiency of other specified B group vitamins: Secondary | ICD-10-CM | POA: Diagnosis not present

## 2012-07-09 DIAGNOSIS — E039 Hypothyroidism, unspecified: Secondary | ICD-10-CM | POA: Diagnosis not present

## 2012-07-09 DIAGNOSIS — N281 Cyst of kidney, acquired: Secondary | ICD-10-CM | POA: Diagnosis not present

## 2012-07-09 DIAGNOSIS — E119 Type 2 diabetes mellitus without complications: Secondary | ICD-10-CM | POA: Diagnosis not present

## 2012-07-09 DIAGNOSIS — E538 Deficiency of other specified B group vitamins: Secondary | ICD-10-CM | POA: Diagnosis not present

## 2012-07-13 DIAGNOSIS — N139 Obstructive and reflux uropathy, unspecified: Secondary | ICD-10-CM | POA: Diagnosis not present

## 2012-08-31 DIAGNOSIS — E538 Deficiency of other specified B group vitamins: Secondary | ICD-10-CM | POA: Diagnosis not present

## 2012-09-17 DIAGNOSIS — Z125 Encounter for screening for malignant neoplasm of prostate: Secondary | ICD-10-CM | POA: Diagnosis not present

## 2012-09-17 DIAGNOSIS — E119 Type 2 diabetes mellitus without complications: Secondary | ICD-10-CM | POA: Diagnosis not present

## 2012-09-17 DIAGNOSIS — E039 Hypothyroidism, unspecified: Secondary | ICD-10-CM | POA: Diagnosis not present

## 2012-09-17 DIAGNOSIS — I10 Essential (primary) hypertension: Secondary | ICD-10-CM | POA: Diagnosis not present

## 2012-09-17 DIAGNOSIS — E538 Deficiency of other specified B group vitamins: Secondary | ICD-10-CM | POA: Diagnosis not present

## 2012-09-27 DIAGNOSIS — Z23 Encounter for immunization: Secondary | ICD-10-CM | POA: Diagnosis not present

## 2012-09-27 DIAGNOSIS — Z1212 Encounter for screening for malignant neoplasm of rectum: Secondary | ICD-10-CM | POA: Diagnosis not present

## 2012-09-27 DIAGNOSIS — Z125 Encounter for screening for malignant neoplasm of prostate: Secondary | ICD-10-CM | POA: Diagnosis not present

## 2012-09-27 DIAGNOSIS — I872 Venous insufficiency (chronic) (peripheral): Secondary | ICD-10-CM | POA: Diagnosis not present

## 2012-09-27 DIAGNOSIS — Z0000 Encounter for general adult medical examination without abnormal findings: Secondary | ICD-10-CM | POA: Diagnosis not present

## 2012-09-27 DIAGNOSIS — E538 Deficiency of other specified B group vitamins: Secondary | ICD-10-CM | POA: Diagnosis not present

## 2012-11-02 DIAGNOSIS — E538 Deficiency of other specified B group vitamins: Secondary | ICD-10-CM | POA: Diagnosis not present

## 2012-12-07 DIAGNOSIS — E538 Deficiency of other specified B group vitamins: Secondary | ICD-10-CM | POA: Diagnosis not present

## 2013-01-07 DIAGNOSIS — Z6832 Body mass index (BMI) 32.0-32.9, adult: Secondary | ICD-10-CM | POA: Diagnosis not present

## 2013-01-07 DIAGNOSIS — G4733 Obstructive sleep apnea (adult) (pediatric): Secondary | ICD-10-CM | POA: Diagnosis not present

## 2013-01-07 DIAGNOSIS — I10 Essential (primary) hypertension: Secondary | ICD-10-CM | POA: Diagnosis not present

## 2013-01-07 DIAGNOSIS — E538 Deficiency of other specified B group vitamins: Secondary | ICD-10-CM | POA: Diagnosis not present

## 2013-01-07 DIAGNOSIS — E119 Type 2 diabetes mellitus without complications: Secondary | ICD-10-CM | POA: Diagnosis not present

## 2013-02-08 DIAGNOSIS — E538 Deficiency of other specified B group vitamins: Secondary | ICD-10-CM | POA: Diagnosis not present

## 2013-02-28 DIAGNOSIS — H35379 Puckering of macula, unspecified eye: Secondary | ICD-10-CM | POA: Diagnosis not present

## 2013-02-28 DIAGNOSIS — H04129 Dry eye syndrome of unspecified lacrimal gland: Secondary | ICD-10-CM | POA: Diagnosis not present

## 2013-02-28 DIAGNOSIS — Z961 Presence of intraocular lens: Secondary | ICD-10-CM | POA: Diagnosis not present

## 2013-02-28 DIAGNOSIS — E119 Type 2 diabetes mellitus without complications: Secondary | ICD-10-CM | POA: Diagnosis not present

## 2013-03-09 DIAGNOSIS — E538 Deficiency of other specified B group vitamins: Secondary | ICD-10-CM | POA: Diagnosis not present

## 2013-03-29 DIAGNOSIS — E039 Hypothyroidism, unspecified: Secondary | ICD-10-CM | POA: Diagnosis not present

## 2013-03-29 DIAGNOSIS — IMO0002 Reserved for concepts with insufficient information to code with codable children: Secondary | ICD-10-CM | POA: Diagnosis not present

## 2013-03-29 DIAGNOSIS — Z1331 Encounter for screening for depression: Secondary | ICD-10-CM | POA: Diagnosis not present

## 2013-03-29 DIAGNOSIS — I10 Essential (primary) hypertension: Secondary | ICD-10-CM | POA: Diagnosis not present

## 2013-03-29 DIAGNOSIS — Z6833 Body mass index (BMI) 33.0-33.9, adult: Secondary | ICD-10-CM | POA: Diagnosis not present

## 2013-03-29 DIAGNOSIS — G4733 Obstructive sleep apnea (adult) (pediatric): Secondary | ICD-10-CM | POA: Diagnosis not present

## 2013-03-29 DIAGNOSIS — E119 Type 2 diabetes mellitus without complications: Secondary | ICD-10-CM | POA: Diagnosis not present

## 2013-04-06 ENCOUNTER — Inpatient Hospital Stay (HOSPITAL_COMMUNITY)
Admission: EM | Admit: 2013-04-06 | Discharge: 2013-04-09 | DRG: 310 | Disposition: A | Discharge status: Home / Self Care | Payer: Medicare Other | Attending: Internal Medicine | Admitting: Internal Medicine

## 2013-04-06 ENCOUNTER — Encounter (HOSPITAL_COMMUNITY): Payer: Self-pay | Admitting: *Deleted

## 2013-04-06 ENCOUNTER — Emergency Department (HOSPITAL_COMMUNITY): Payer: Medicare Other

## 2013-04-06 DIAGNOSIS — G4730 Sleep apnea, unspecified: Secondary | ICD-10-CM

## 2013-04-06 DIAGNOSIS — R079 Chest pain, unspecified: Secondary | ICD-10-CM | POA: Diagnosis present

## 2013-04-06 DIAGNOSIS — C4490 Unspecified malignant neoplasm of skin, unspecified: Secondary | ICD-10-CM | POA: Diagnosis present

## 2013-04-06 DIAGNOSIS — E119 Type 2 diabetes mellitus without complications: Secondary | ICD-10-CM | POA: Diagnosis not present

## 2013-04-06 DIAGNOSIS — M545 Low back pain, unspecified: Secondary | ICD-10-CM | POA: Diagnosis present

## 2013-04-06 DIAGNOSIS — Z9981 Dependence on supplemental oxygen: Secondary | ICD-10-CM | POA: Diagnosis not present

## 2013-04-06 DIAGNOSIS — I480 Paroxysmal atrial fibrillation: Secondary | ICD-10-CM

## 2013-04-06 DIAGNOSIS — K219 Gastro-esophageal reflux disease without esophagitis: Secondary | ICD-10-CM | POA: Diagnosis present

## 2013-04-06 DIAGNOSIS — Z794 Long term (current) use of insulin: Secondary | ICD-10-CM

## 2013-04-06 DIAGNOSIS — R0789 Other chest pain: Secondary | ICD-10-CM | POA: Diagnosis not present

## 2013-04-06 DIAGNOSIS — M129 Arthropathy, unspecified: Secondary | ICD-10-CM | POA: Diagnosis present

## 2013-04-06 DIAGNOSIS — D509 Iron deficiency anemia, unspecified: Secondary | ICD-10-CM | POA: Diagnosis present

## 2013-04-06 DIAGNOSIS — I10 Essential (primary) hypertension: Secondary | ICD-10-CM | POA: Diagnosis not present

## 2013-04-06 DIAGNOSIS — D518 Other vitamin B12 deficiency anemias: Secondary | ICD-10-CM | POA: Diagnosis not present

## 2013-04-06 DIAGNOSIS — G8929 Other chronic pain: Secondary | ICD-10-CM | POA: Diagnosis present

## 2013-04-06 DIAGNOSIS — I498 Other specified cardiac arrhythmias: Secondary | ICD-10-CM | POA: Diagnosis present

## 2013-04-06 DIAGNOSIS — K449 Diaphragmatic hernia without obstruction or gangrene: Secondary | ICD-10-CM | POA: Diagnosis not present

## 2013-04-06 DIAGNOSIS — Z87891 Personal history of nicotine dependence: Secondary | ICD-10-CM | POA: Diagnosis not present

## 2013-04-06 DIAGNOSIS — F411 Generalized anxiety disorder: Secondary | ICD-10-CM | POA: Diagnosis present

## 2013-04-06 DIAGNOSIS — I440 Atrioventricular block, first degree: Secondary | ICD-10-CM | POA: Diagnosis present

## 2013-04-06 DIAGNOSIS — I4891 Unspecified atrial fibrillation: Principal | ICD-10-CM

## 2013-04-06 DIAGNOSIS — I447 Left bundle-branch block, unspecified: Secondary | ICD-10-CM | POA: Diagnosis present

## 2013-04-06 DIAGNOSIS — G4733 Obstructive sleep apnea (adult) (pediatric): Secondary | ICD-10-CM | POA: Diagnosis present

## 2013-04-06 DIAGNOSIS — I517 Cardiomegaly: Secondary | ICD-10-CM | POA: Diagnosis not present

## 2013-04-06 DIAGNOSIS — R072 Precordial pain: Secondary | ICD-10-CM | POA: Diagnosis not present

## 2013-04-06 HISTORY — DX: Other specified disorders of nose and nasal sinuses: J34.89

## 2013-04-06 HISTORY — DX: Type 2 diabetes mellitus without complications: E11.9

## 2013-04-06 HISTORY — DX: Dependence on supplemental oxygen: Z99.81

## 2013-04-06 HISTORY — DX: Gastro-esophageal reflux disease without esophagitis: K21.9

## 2013-04-06 HISTORY — DX: Obstructive sleep apnea (adult) (pediatric): G47.33

## 2013-04-06 HISTORY — DX: Unspecified osteoarthritis, unspecified site: M19.90

## 2013-04-06 HISTORY — DX: Spotted fever due to Rickettsia rickettsii: A77.0

## 2013-04-06 HISTORY — DX: Dependence on other enabling machines and devices: Z99.89

## 2013-04-06 LAB — CBC
HCT: 33 % — ABNORMAL LOW (ref 39.0–52.0)
Hemoglobin: 10.8 g/dL — ABNORMAL LOW (ref 13.0–17.0)
MCH: 29.3 pg (ref 26.0–34.0)
MCHC: 32.7 g/dL (ref 30.0–36.0)

## 2013-04-06 LAB — COMPREHENSIVE METABOLIC PANEL
BUN: 17 mg/dL (ref 6–23)
Calcium: 9 mg/dL (ref 8.4–10.5)
Creatinine, Ser: 1.02 mg/dL (ref 0.50–1.35)
GFR calc Af Amer: 78 mL/min — ABNORMAL LOW (ref >90)
Glucose, Bld: 134 mg/dL — ABNORMAL HIGH (ref 70–99)
Total Protein: 6.6 g/dL (ref 6.0–8.3)

## 2013-04-06 LAB — GLUCOSE, CAPILLARY
Glucose-Capillary: 150 mg/dL — ABNORMAL HIGH (ref 70–99)
Glucose-Capillary: 163 mg/dL — ABNORMAL HIGH (ref 70–99)

## 2013-04-06 LAB — POCT I-STAT TROPONIN I: Troponin i, poc: 0 ng/mL (ref 0.00–0.08)

## 2013-04-06 MED ORDER — ONDANSETRON HCL 4 MG/2ML IJ SOLN: 4.0000 mg | Freq: Four times a day (QID) | INTRAMUSCULAR | Status: DC | PRN

## 2013-04-06 MED ORDER — BENAZEPRIL HCL 40 MG PO TABS
40.0000 mg | ORAL_TABLET | Freq: Every day | ORAL | Status: DC
  Administered 2013-04-07 – 2013-04-09 (×3): 40 mg via ORAL
  Filled 2013-04-06 (×3): qty 1

## 2013-04-06 MED ORDER — FLUTICASONE PROPIONATE 50 MCG/ACT NA SUSP
2.0000 | Freq: Every day | NASAL | Status: DC
  Administered 2013-04-06: 2 via NASAL
  Filled 2013-04-06 (×2): qty 16

## 2013-04-06 MED ORDER — INSULIN ASPART 100 UNIT/ML ~~LOC~~ SOLN
0.0000 [IU] | Freq: Three times a day (TID) | SUBCUTANEOUS | Status: DC
  Administered 2013-04-08 (×2): 2 [IU] via SUBCUTANEOUS

## 2013-04-06 MED ORDER — DIAZEPAM 5 MG PO TABS
5.0000 mg | ORAL_TABLET | Freq: Every evening | ORAL | Status: DC | PRN
  Administered 2013-04-06 – 2013-04-08 (×3): 5 mg via ORAL
  Filled 2013-04-06 (×3): qty 1

## 2013-04-06 MED ORDER — ACETAMINOPHEN 325 MG PO TABS
650.0000 mg | ORAL_TABLET | ORAL | Status: DC | PRN
  Administered 2013-04-06 – 2013-04-08 (×4): 650 mg via ORAL
  Filled 2013-04-06 (×4): qty 2

## 2013-04-06 MED ORDER — INSULIN ASPART 100 UNIT/ML ~~LOC~~ SOLN: 0.0000 [IU] | SUBCUTANEOUS | Status: DC

## 2013-04-06 MED ORDER — PIOGLITAZONE HCL 30 MG PO TABS
30.0000 mg | ORAL_TABLET | Freq: Every day | ORAL | Status: DC
  Administered 2013-04-07 – 2013-04-09 (×3): 30 mg via ORAL
  Filled 2013-04-06 (×4): qty 1

## 2013-04-06 MED ORDER — AMLODIPINE BESYLATE 5 MG PO TABS
5.0000 mg | ORAL_TABLET | Freq: Every day | ORAL | Status: DC
  Administered 2013-04-07 – 2013-04-08 (×2): 5 mg via ORAL
  Filled 2013-04-06 (×2): qty 1

## 2013-04-06 MED ORDER — PANTOPRAZOLE SODIUM 40 MG PO TBEC
40.0000 mg | DELAYED_RELEASE_TABLET | Freq: Every day | ORAL | Status: DC
  Administered 2013-04-07 – 2013-04-09 (×3): 40 mg via ORAL
  Filled 2013-04-06 (×3): qty 1

## 2013-04-06 MED ORDER — CARVEDILOL 25 MG PO TABS
25.0000 mg | ORAL_TABLET | Freq: Two times a day (BID) | ORAL | Status: DC
  Administered 2013-04-06 – 2013-04-07 (×2): 25 mg via ORAL
  Filled 2013-04-06 (×4): qty 1

## 2013-04-06 MED ORDER — METFORMIN HCL 500 MG PO TABS
1000.0000 mg | ORAL_TABLET | Freq: Two times a day (BID) | ORAL | Status: DC
  Administered 2013-04-06 – 2013-04-07 (×3): 1000 mg via ORAL
  Filled 2013-04-06 (×6): qty 2

## 2013-04-06 MED ORDER — ASPIRIN 81 MG PO CHEW
324.0000 mg | CHEWABLE_TABLET | Freq: Once | ORAL | Status: AC
  Administered 2013-04-06: 324 mg via ORAL
  Filled 2013-04-06: qty 4

## 2013-04-06 MED ORDER — ENOXAPARIN SODIUM 40 MG/0.4ML ~~LOC~~ SOLN
40.0000 mg | SUBCUTANEOUS | Status: DC
  Administered 2013-04-06: 40 mg via SUBCUTANEOUS
  Filled 2013-04-06 (×2): qty 0.4

## 2013-04-06 MED ORDER — METFORMIN HCL 500 MG PO TABS
1000.0000 mg | ORAL_TABLET | Freq: Two times a day (BID) | ORAL | Status: DC
  Filled 2013-04-06 (×2): qty 2

## 2013-04-06 MED ORDER — SERTRALINE HCL 50 MG PO TABS
50.0000 mg | ORAL_TABLET | Freq: Every day | ORAL | Status: DC
  Administered 2013-04-06 – 2013-04-07 (×2): 50 mg via ORAL
  Filled 2013-04-06 (×2): qty 1

## 2013-04-06 NOTE — ED Provider Notes (Signed)
CSN: [id number]     Arrival date & time 04/06/13  1128 History     First MD Initiated Contact with Patient 04/06/13 1138     Chief Complaint  Patient presents with  . Chest Pain  . Hyperglycemia   (Consider location/radiation/quality/duration/timing/severity/associated sxs/prior Treatment) HPI Comments: 79 yo male with htn, DM presents with lower anterior chest pain for two days.  Pt has had similar but not this severe.  Ache, constant, improves with belching.  Pt has known hernia.  At pcp office pt hr varying from 70 to 140.  No known arrhythmias.  Mild diaphoresis.  No exertional sxs.  Improves with time.   Patient is a 79 y.o. male presenting with chest pain and hyperglycemia. The history is provided by the patient and the spouse.  Chest Pain Pain location:  Substernal area Associated symptoms: no abdominal pain, no back pain, no fever, no headache, no shortness of breath and not vomiting   Hyperglycemia Associated symptoms: chest pain   Associated symptoms: no abdominal pain, no dysuria, no fever, no shortness of breath and no vomiting     Past Medical History  Diagnosis Date  . Diabetes mellitus     Type 2  . Hypertension   . Anxiety   . Nephrolithiasis   . Sleep apnea   . Microcytic anemia     as well that must be iron deficiency  . Chest pain   . Hiatal hernia    Past Surgical History  Procedure Laterality Date  . Hernia repair    . Cardiac catheterization      by Dr. Elease Hashimoto, January 24, 2004, that shows minimal coronary artery irregularities and normal left ventricular function  . Vasectomy      Hx of    No family history on file. History  Substance Use Topics  . Smoking status: Former Games developer  . Smokeless tobacco: Not on file  . Alcohol Use: No    Review of Systems  Constitutional: Negative for fever and chills.  HENT: Negative for neck pain and neck stiffness.   Eyes: Negative for visual disturbance.  Respiratory: Negative for shortness of breath.    Cardiovascular: Positive for chest pain.  Gastrointestinal: Negative for vomiting and abdominal pain.  Genitourinary: Negative for dysuria and flank pain.  Musculoskeletal: Negative for back pain.  Skin: Negative for rash.  Neurological: Positive for light-headedness. Negative for headaches.    Allergies  Codeine  Home Medications   Current Outpatient Rx  Name  Route  Sig  Dispense  Refill  . amLODipine (NORVASC) 5 MG tablet   Oral   Take 5 mg by mouth daily.           . benazepril (LOTENSIN) 40 MG tablet   Oral   Take 40 mg by mouth daily.           . carvedilol (COREG) 25 MG tablet   Oral   Take 25 mg by mouth 2 (two) times daily with a meal.           . Cyanocobalamin (VITAMIN B-12 IJ)   Injection   Inject as directed every 30 (thirty) days.           . diazepam (VALIUM) 5 MG tablet   Oral   Take 5 mg by mouth at bedtime.           . fluticasone (FLONASE) 50 MCG/ACT nasal spray   Nasal   Place 1 spray into the nose daily.           Marland Kitchen  metFORMIN (GLUCOPHAGE) 1000 MG tablet   Oral   Take 1,000 mg by mouth 2 (two) times daily with a meal.         . omeprazole (PRILOSEC) 20 MG capsule   Oral   Take 20 mg by mouth 2 (two) times daily.          . pioglitazone (ACTOS) 30 MG tablet   Oral   Take 30 mg by mouth daily.           . sertraline (ZOLOFT) 50 MG tablet   Oral   Take 50 mg by mouth daily.          BP 116/75  Pulse 74  Temp(Src) 97.5 F (36.4 C) (Oral)  Resp 18  SpO2 96% Physical Exam  Nursing note and vitals reviewed. Constitutional: He is oriented to person, place, and time. He appears well-developed and well-nourished.  HENT:  Head: Normocephalic and atraumatic.  Eyes: Conjunctivae are normal. Right eye exhibits no discharge. Left eye exhibits no discharge.  Neck: Normal range of motion. Neck supple. No tracheal deviation present.  Cardiovascular: Normal rate and regular rhythm.   Pulmonary/Chest: Effort normal and breath  sounds normal.  Abdominal: Soft. He exhibits distension (mild). There is no tenderness. There is no guarding.  Musculoskeletal: He exhibits no edema and no tenderness.  Neurological: He is alert and oriented to person, place, and time.  Skin: Skin is warm. No rash noted.  Psychiatric: He has a normal mood and affect.    ED Course   Procedures (including critical care time)  Labs Reviewed  CBC - Abnormal; Notable for the following:    RBC 3.69 (*)    Hemoglobin 10.8 (*)    HCT 33.0 (*)    RDW 15.9 (*)    All other components within normal limits  COMPREHENSIVE METABOLIC PANEL - Abnormal; Notable for the following:    Glucose, Bld 134 (*)    Albumin 3.0 (*)    GFR calc non Af Amer 67 (*)    GFR calc Af Amer 78 (*)    All other components within normal limits  POCT I-STAT TROPONIN I   Dg Chest 2 View  04/06/2013   *RADIOLOGY REPORT*  Clinical Data: Chest pain  CHEST - 2 VIEW  Comparison: 06/06/2011  Findings: Heart size is normal.  Large hiatal hernia is again demonstrated.  Mediastinal structures are otherwise unremarkable. The lungs are clear.  The vascularity is normal.  No effusions.  No significant bony finding.  IMPRESSION: Large hiatal hernia again demonstrated.  No active disease.   Original Report Authenticated By: Paulina Fusi, M.D.   No diagnosis found.  MDM  Chest pain atypical however with age/ risk factors decision to place in observation on tele. Minimal chest pain on recheck.   Heart healthy meal ordered. ASA given. Spoke with Dr Joseph Art, accepted observation. EKG and cxr reviewed.  Date: 04/06/2013  Rate: 68  Rhythm: normal sinus rhythm  QRS Axis: left  Intervals: PR prolonged and QT prolonged  ST/T Wave abnormalities: nonspecific ST changes  Conduction Disutrbances:left bundle branch block  Narrative Interpretation:   Old EKG Reviewed: unchanged     Enid Skeens, MD 04/06/13 [phone]

## 2013-04-06 NOTE — ED Notes (Signed)
Patient said he ate a hamburger the day before yesterday and he began having chest pain that day.  He thought it was his hiatal hernia and thought it would go away but it got worse.  He called his doctor and they ascertained that his pulse was, "really high" and so they advised him to come to the ED.

## 2013-04-06 NOTE — ED Notes (Signed)
Pt states that he is here with gas feeling in chest, hypotension, elevated heart rate, weakness, and hyperglycemia

## 2013-04-06 NOTE — ED Notes (Signed)
Patient transported to X-ray 

## 2013-04-06 NOTE — Progress Notes (Signed)
Placed pt. On cpap as per order. Pt.is tolerating well at this time. 

## 2013-04-06 NOTE — H&P (Signed)
[facility] History and Physical  Richard [name] ZOX:[id number] DOB: [birth year] DOA: 04/06/2013  Referring physician:  PCP: Hoyle Sauer, MD  Specialists:   Chief Complaint: Chest pain x2 days  HPI: Richard [name] is a 79 y.o. male PMHx HTN,, DM, sleep apnea, hiatal hernia. presents with lower anterior substernal chest pain for two days, (+) SOB, negative nausea vomiting negative back pain negative fever negative radiation, (+) Presyncope.  Pt has had similar but not this severe.  Started after eating Hamburger. Ache, constant, improves with belching. Pt has known hernia. At pcp office pt hr varying from 70 to 140. No known arrhythmias. Mild diaphoresis. No exertional sxs. I-STAT troponin negative. Heart score= 4   Review of Systems: The patient denies anorexia, fever, weight loss,, vision loss, decreased hearing, hoarseness, syncope, dyspnea on exertion, peripheral edema, balance deficits, hemoptysis, melena, hematochezia, severe indigestion/heartburn, hematuria, incontinence, genital sores, muscle weakness, suspicious skin lesions, transient blindness, difficulty walking, depression, unusual weight change, abnormal bleeding, enlarged lymph nodes, angioedema, and breast masses.    Past Medical History  Diagnosis Date  . Diabetes mellitus     Type 2  . Hypertension   . Anxiety   . Nephrolithiasis   . Sleep apnea   . Microcytic anemia     as well that must be iron deficiency  . Chest pain   . Hiatal hernia    Past Surgical History  Procedure Laterality Date  . Hernia repair    . Cardiac catheterization      by Dr. Elease Hashimoto, January 24, 2004, that shows minimal coronary artery irregularities and normal left ventricular function  . Vasectomy      Hx of    Social History:  reports that he has quit smoking. He does not have any smokeless tobacco history on file. He reports that he does not drink alcohol or use illicit drugs.   Allergies  Allergen Reactions  . Codeine Other  (See Comments)    Hallucinations     No family history on file.   Prior to Admission medications   Medication Sig Start Date End Date Taking? Authorizing Provider  amLODipine (NORVASC) 5 MG tablet Take 5 mg by mouth daily.     Yes Historical Provider, MD  benazepril (LOTENSIN) 40 MG tablet Take 40 mg by mouth daily.     Yes Historical Provider, MD  carvedilol (COREG) 25 MG tablet Take 25 mg by mouth 2 (two) times daily with a meal.     Yes Historical Provider, MD  Cyanocobalamin (VITAMIN B-12 IJ) Inject as directed every 30 (thirty) days.     Yes Historical Provider, MD  diazepam (VALIUM) 5 MG tablet Take 5 mg by mouth at bedtime.     Yes Historical Provider, MD  fluticasone (FLONASE) 50 MCG/ACT nasal spray Place 1 spray into the nose daily.     Yes Historical Provider, MD  metFORMIN (GLUCOPHAGE) 1000 MG tablet Take 1,000 mg by mouth 2 (two) times daily with a meal.   Yes Historical Provider, MD  omeprazole (PRILOSEC) 20 MG capsule Take 20 mg by mouth 2 (two) times daily.    Yes Historical Provider, MD  pioglitazone (ACTOS) 30 MG tablet Take 30 mg by mouth daily.     Yes Historical Provider, MD  sertraline (ZOLOFT) 50 MG tablet Take 50 mg by mouth daily.   Yes Historical Provider, MD   Physical Exam: Filed Vitals:   04/06/13 1134 04/06/13 1200 04/06/13 1215 04/06/13 1400  BP: 141/79 136/68 141/68 116/75  Pulse: 69  68 74  Temp: 97.5 F (36.4 C)     TempSrc: Oral     Resp: 18  11 18   SpO2: 95%  94% 96%     General:Alert, NAD  Eyes: PERRLA  Cardiovascular: RRR, (-) M/R/G, PT/DP pulse (+) 2 bilat  Respiratory: CTA Bilat  Abdomen: Soft nontender nondistended plus bowel sounds    Labs on Admission:  Basic Metabolic Panel:  Recent Labs Lab 04/06/13 1137  NA 138  K 4.3  CL 103  CO2 24  GLUCOSE 134*  BUN 17  CREATININE 1.02  CALCIUM 9.0   Liver Function Tests:  Recent Labs Lab 04/06/13 1137  AST 28  ALT 19  ALKPHOS 55  BILITOT 0.3  PROT 6.6  ALBUMIN 3.0*    No results found for this basename: LIPASE, AMYLASE,  in the last 168 hours No results found for this basename: AMMONIA,  in the last 168 hours CBC:  Recent Labs Lab 04/06/13 1137  WBC 4.6  HGB 10.8*  HCT 33.0*  MCV 89.4  PLT 155   Cardiac Enzymes: No results found for this basename: CKTOTAL, CKMB, CKMBINDEX, TROPONINI,  in the last 168 hours  BNP (last 3 results) No results found for this basename: PROBNP,  in the last 8760 hours CBG: No results found for this basename: GLUCAP,  in the last 168 hours  Radiological Exams on Admission: Dg Chest 2 View  04/06/2013   *RADIOLOGY REPORT*  Clinical Data: Chest pain  CHEST - 2 VIEW  Comparison: 06/06/2011  Findings: Heart size is normal.  Large hiatal hernia is again demonstrated.  Mediastinal structures are otherwise unremarkable. The lungs are clear.  The vascularity is normal.  No effusions.  No significant bony finding.  IMPRESSION: Large hiatal hernia again demonstrated.  No active disease.   Original Report Authenticated By: Paulina Fusi, M.D.    Procedure Myocardial perfusion 05/26/2012 Exercise Capacity: Adenosine study with no exercise.  BP Response: Normal blood pressure response.  Clinical Symptoms: No significant symptoms noted.  ECG Impression: No significant ST segment change suggestive of ischemia.  Comparison with Prior Nuclear Study: No images to compare    EKG: Compared EKG 05/24/2012;   sinus rhythm with first-degree AV block remains, left bundle branch block remains, no significant change    Assessment/Plan Active Problems:   * No active hospital problems. *   1. Chest pain; patient will have serial troponins, echocardiogram, and if all normal discharge home for [facility] with [facility] Cardiology. Consider nuclear stress 2. HTN; within [facility] guidelines continue current medication 3. Sleep apnea; tonsil respiratory for CPAP machine overnight 4. DM; obtain hemoglobin A1c and use home medication+ SSI to control  glucose levels 5. Hiatal hernia; consider outpatient GI consult  Disposition Plan:    Time spent: 60 minutes   Drema Dallas [facility] Pager [phone]  If 7PM-7AM, please contact night-coverage [email or web address] Password [facility] 04/06/2013, 2:11 PM

## 2013-04-07 DIAGNOSIS — E119 Type 2 diabetes mellitus without complications: Secondary | ICD-10-CM

## 2013-04-07 DIAGNOSIS — R079 Chest pain, unspecified: Secondary | ICD-10-CM

## 2013-04-07 DIAGNOSIS — I4891 Unspecified atrial fibrillation: Principal | ICD-10-CM

## 2013-04-07 DIAGNOSIS — I517 Cardiomegaly: Secondary | ICD-10-CM | POA: Diagnosis not present

## 2013-04-07 LAB — TROPONIN I
Troponin I: <0.3 ng/mL (ref <0.30)
Troponin I: <0.3 ng/mL (ref <0.30)
Troponin I: <0.3 ng/mL (ref <0.30)

## 2013-04-07 LAB — GLUCOSE, CAPILLARY: Glucose-Capillary: 167 mg/dL — ABNORMAL HIGH (ref 70–99)

## 2013-04-07 MED ORDER — SERTRALINE HCL 50 MG PO TABS
50.0000 mg | ORAL_TABLET | Freq: Every day | ORAL | Status: DC
  Administered 2013-04-08: 50 mg via ORAL
  Filled 2013-04-07 (×2): qty 1

## 2013-04-07 MED ORDER — METOPROLOL TARTRATE 25 MG PO TABS
25.0000 mg | ORAL_TABLET | Freq: Two times a day (BID) | ORAL | Status: DC
  Administered 2013-04-07: 25 mg via ORAL
  Filled 2013-04-07 (×4): qty 1

## 2013-04-07 MED ORDER — RIVAROXABAN 20 MG PO TABS
20.0000 mg | ORAL_TABLET | Freq: Every day | ORAL | Status: DC
  Administered 2013-04-07 – 2013-04-08 (×2): 20 mg via ORAL
  Filled 2013-04-07 (×3): qty 1

## 2013-04-07 NOTE — Progress Notes (Signed)
ANTICOAGULATION CONSULT NOTE - Initial Consult  Pharmacy Consult for rivaroxaban Indication: atrial fibrillation  Allergies  Allergen Reactions  . Codeine Other (See Comments)    Hallucinations     Patient Measurements: Height: 6\' 3"  (190.5 cm) Weight: 261 lb 9.6 oz (118.661 kg) IBW/kg (Calculated) : 84.5   Vital Signs: Temp: 97.5 F (36.4 C) (08/14 0629) Temp src: Oral (08/14 0629) BP: 153/87 mmHg (08/14 0629) Pulse Rate: 70 (08/14 0629)  Labs:  Recent Labs  2013/04/27 1137 04/07/13 0015 04/07/13 0619  HGB 10.8*  --   --   HCT 33.0*  --   --   PLT 155  --   --   CREATININE 1.02  --   --   TROPONINI  --  <0.30 <0.30    Estimated Creatinine Clearance: 80.2 ml/min (by C-G formula based on Cr of 1.02).   Medical History: Past Medical History  Diagnosis Date  . Hypertension   . Anxiety   . Microcytic anemia     as well that must be iron deficiency  . Chest pain   . Hiatal hernia   . Skin cancer of lip     "lower" (04/27/13)  . Sinus drainage     "all the time" (2013-04-27)  . OSA on CPAP   . Type II diabetes mellitus   . History of blood transfusion     "not related to OR; blood count was low" (Apr 27, 2013)  . GERD (gastroesophageal reflux disease)   . Daily headache     "last 2 days" (2013/04/27)  . Arthritis     "a little in my back" (27-Apr-2013)  . Chronic lower back pain     "if I lift at all" (04-27-13)  . Nephrolithiasis     "one years ago; passed on it's own" (2013/04/27)  . On home oxygen therapy     "2L w/CPAP at night" (Apr 27, 2013)  . [place] spotted fever ~ 1945    Medications:  Prescriptions prior to admission  Medication Sig Dispense Refill  . amLODipine (NORVASC) 5 MG tablet Take 5 mg by mouth daily.        . benazepril (LOTENSIN) 40 MG tablet Take 40 mg by mouth daily.        . carvedilol (COREG) 25 MG tablet Take 25 mg by mouth 2 (two) times daily with a meal.        . Cyanocobalamin (VITAMIN B-12 IJ) Inject as directed every  30 (thirty) days.        . diazepam (VALIUM) 5 MG tablet Take 5 mg by mouth at bedtime.        . fluticasone (FLONASE) 50 MCG/ACT nasal spray Place 1 spray into the nose daily.        . metFORMIN (GLUCOPHAGE) 1000 MG tablet Take 1,000 mg by mouth 2 (two) times daily with a meal.      . omeprazole (PRILOSEC) 20 MG capsule Take 20 mg by mouth 2 (two) times daily.       . pioglitazone (ACTOS) 30 MG tablet Take 30 mg by mouth daily.        . sertraline (ZOLOFT) 50 MG tablet Take 50 mg by mouth daily.        Assessment: Richard [name] is a 79 yo man to start rivaroxaban (Xarelto) for afib stroke prevention.  PMH of anemia 2nd vit b12 deficiency- on monthly b12 shots.   His wt is 118.7 kg. His creat cl is well above 30 ml/min.  His H/H  is 10.8/33 and his PLTC is 155. Plan:  1. DC LMWH for vte px 2. xarelto 20 mg qday with the evening meal 3. Xarelto ecudation for pt and wife [name], Pharm.D. [phone] 04/07/2013 11:52 AM

## 2013-04-07 NOTE — Progress Notes (Signed)
PATIENT DETAILS Name: Richard [name] Age: 79 y.o. Sex: male Date of Birth: Jul 07, 1931 Admit Date: 04/06/2013 Admitting Physician Drema Dallas, MD AVW:UJWJ,[id number] R, MD  Subjective: Admitted with palpitations, lightheadedness and intermittent chest pain.  Assessment/Plan: Principal Problem: Atrial fibrillation with rapid ventricular response - Patient and wife give a history of palpitations over the past few days-since Monday, per patient's wife heart rate at home has been in the 130s to 140s range at times. Patient was admitted and placed on telemetry, review of telemetry strips from last evening does show an irregular rhythm consistent with atrial fibrillation. Cardiology was then consulted. - Current recommendations are to stop Coreg, and start metoprolol 25 mg twice a day. - He also has a Chads-Vasc score of 4- cardiology has started patient on Xarelto. - We will observe another 24 hours to up titrate his beta blockers.  Active Problems: - Chest pain - Cardiac enzymes negative, await official reading of 2-D echocardiogram - Nuclear stress test done in October of 2013 negative - Cardiology not planning on any further workup at this time.    HTN (hypertension) - BP currently controlled with metoprolol, amlodipine and benazepril    Diabetes mellitus - CBGs stable, continue with Actos and sliding scale regimen    Hiatal hernia/GERD - Continue with PPI  Disposition: Remain inpatient- Home tomorrow  DVT Prophylaxis: Not needed as on Xarelto  Code Status: Full code   Family Communication Spouse at bedside  Procedures:  None  CONSULTS:  cardiology   MEDICATIONS: Scheduled Meds: . amLODipine  5 mg Oral Daily  . benazepril  40 mg Oral Daily  . fluticasone  2 spray Each Nare Daily  . insulin aspart  0-15 Units Subcutaneous TID WC  . metFORMIN  1,000 mg Oral BID WC  . metoprolol tartrate  25 mg Oral BID  . pantoprazole  40 mg Oral Daily  . pioglitazone  30  mg Oral Q breakfast  . rivaroxaban  20 mg Oral Q supper  . sertraline  50 mg Oral Daily   Continuous Infusions:  PRN Meds:.acetaminophen, diazepam, ondansetron (ZOFRAN) IV  Antibiotics: Anti-infectives   None       PHYSICAL EXAM: Vital signs in last 24 hours: Filed Vitals:   04/06/13 1545 04/06/13 2029 04/06/13 2231 04/07/13 0629  BP: 138/99 121/66  153/87  Pulse: 127 69 64 70  Temp: 97.4 F (36.3 C) 97.9 F (36.6 C)  97.5 F (36.4 C)  TempSrc:  Oral  Oral  Resp: 16 18 18 18   Height: 6\' 3"  (1.905 m)     Weight: 118.661 kg (261 lb 9.6 oz)     SpO2: 96% 95% 97% 96%    Weight change:  Filed Weights   04/06/13 1545  Weight: 118.661 kg (261 lb 9.6 oz)   Body mass index is 32.7 kg/(m^2).   Gen Exam: Awake and alert with clear speech.   Neck: Supple, No JVD.   Chest: B/L Clear.   CVS: S1 S2 Regular, no murmurs.  Abdomen: soft, BS +, non tender, non distended.  Extremities: no edema, lower extremities warm to touch. Neurologic: Non Focal.   Skin: No Rash.   Wounds: N/A.    Intake/Output from previous day:  Intake/Output Summary (Last 24 hours) at 04/07/13 1248 Last data filed at 04/07/13 2130  Gross per 24 hour  Intake      0 ml  Output    550 ml  Net   -550 ml     LAB RESULTS:  CBC  Recent Labs Lab 04/06/13 1137  WBC 4.6  HGB 10.8*  HCT 33.0*  PLT 155  MCV 89.4  MCH 29.3  MCHC 32.7  RDW 15.9*    Chemistries   Recent Labs Lab 04/06/13 1137  NA 138  K 4.3  CL 103  CO2 24  GLUCOSE 134*  BUN 17  CREATININE 1.02  CALCIUM 9.0    CBG:  Recent Labs Lab 04/06/13 1636 04/06/13 2048 04/07/13 0726 04/07/13 1123  GLUCAP 150* 163* 149* 133*    GFR Estimated Creatinine Clearance: 80.2 ml/min (by C-G formula based on Cr of 1.02).  Coagulation profile No results found for this basename: INR, PROTIME,  in the last 168 hours  Cardiac Enzymes  Recent Labs Lab 04/07/13 0015 04/07/13 0619  TROPONINI <0.30 <0.30    No components  found with this basename: POCBNP,  No results found for this basename: DDIMER,  in the last 72 hours No results found for this basename: HGBA1C,  in the last 72 hours No results found for this basename: CHOL, HDL, LDLCALC, TRIG, CHOLHDL, LDLDIRECT,  in the last 72 hours No results found for this basename: TSH, T4TOTAL, FREET3, T3FREE, THYROIDAB,  in the last 72 hours No results found for this basename: VITAMINB12, FOLATE, FERRITIN, TIBC, IRON, RETICCTPCT,  in the last 72 hours No results found for this basename: LIPASE, AMYLASE,  in the last 72 hours  Urine Studies No results found for this basename: UACOL, UAPR, USPG, UPH, UTP, UGL, UKET, UBIL, UHGB, UNIT, UROB, ULEU, UEPI, UWBC, URBC, UBAC, CAST, CRYS, UCOM, BILUA,  in the last 72 hours  MICROBIOLOGY: No results found for this or any previous visit (from the past 240 hour(s)).  RADIOLOGY STUDIES/RESULTS: Dg Chest 2 View  04/06/2013   *RADIOLOGY REPORT*  Clinical Data: Chest pain  CHEST - 2 VIEW  Comparison: 06/06/2011  Findings: Heart size is normal.  Large hiatal hernia is again demonstrated.  Mediastinal structures are otherwise unremarkable. The lungs are clear.  The vascularity is normal.  No effusions.  No significant bony finding.  IMPRESSION: Large hiatal hernia again demonstrated.  No active disease.   Original Report Authenticated By: Paulina Fusi, M.D.    Jeoffrey Massed, MD  [facility] Pager:[phone]  If 7PM-7AM, please contact night-coverage [email or web address] Password [email or web address] 04/07/2013, 12:48 PM   LOS: 1 day

## 2013-04-07 NOTE — Consult Note (Signed)
Reason for Consult: Atrial fibrillation with rapid ventricular response Referring Physician: [facility] PCP: Dr. Felipa Eth Primary cardiologist: Dr. Brent Bulla is an 79 y.o. male.  HPI: This 79 year old gentleman was admitted on April 24, 2013 with weakness and a rapid heartbeat.  He does not have any prior history of known atrial fibrillation.  He was in his usual state of health until Monday afternoon 04/04/13 when while driving home from an appointment in [place] he noted weakness and he felt like he might black out.  After he arrived home his wife checked his vital signs with her blood pressure machine and found that his heart rate was in the range of 140.  Patient remained feeling very weak the following day.  Yesterday he called the office of his PCP and was advised to come to the emergency room.  Patient had also been experiencing some lower anterior chest discomfort which she attributed TEE and large hamburger for lunch on Monday.  His electrocardiogram in the emergency room on admission showed normal sinus rhythm with first degree AV block and left bundle branch block.  Subsequently however on telemetry he went back into atrial fibrillation with rapid ventricular response.  Presently he is back in sinus rhythm.  The patient has a history of hypertension and diabetes.  He does not have any history of prior stroke.  He does not have a history of ischemic heart disease and he had a normal nuclear stress test in October 2013 by Dr. Elease Hashimoto.  His left ventricular function at that time was normal with ejection fraction 63% there was no evidence of ischemia.  The patient does not have any history of GI bleeding.  He reports that many years ago he had to receive blood transfusions prior to being discovered that he had a vitamin B12 deficiency.  He now receives monthly B12 shots.  He has chronic mild anemia with normal red blood cell morphology.  Past Medical History  Diagnosis Date  .  Hypertension   . Anxiety   . Microcytic anemia     as well that must be iron deficiency  . Chest pain   . Hiatal hernia   . Skin cancer of lip     "lower" (04-24-13)  . Sinus drainage     "all the time" (04/24/2013)  . OSA on CPAP   . Type II diabetes mellitus   . History of blood transfusion     "not related to OR; blood count was low" (04-24-13)  . GERD (gastroesophageal reflux disease)   . Daily headache     "last 2 days" (Apr 24, 2013)  . Arthritis     "a little in my back" (Apr 24, 2013)  . Chronic lower back pain     "if I lift at all" (Apr 24, 2013)  . Nephrolithiasis     "one years ago; passed on it's own" (04-24-13)  . On home oxygen therapy     "2L w/CPAP at night" (24-Apr-2013)  . [place] spotted fever ~ 1945    Past Surgical History  Procedure Laterality Date  . Vasectomy      Hx of   . Lumbar disc surgery  ~ 1993  . Inguinal hernia repair Right   . Cardiac catheterization      by Dr. Elease Hashimoto, January 24, 2004, that shows minimal coronary artery irregularities and normal left ventricular function  . Cataract extraction w/ intraocular lens  implant, bilateral Bilateral   . Skin cancer excision      "lower lip" (  04/06/2013)    History reviewed. No pertinent family history.  Social History:  reports that he has quit smoking. His smoking use included Cigarettes. He has a 20 pack-year smoking history. He has never used smokeless tobacco. He reports that he does not drink alcohol or use illicit drugs.  Allergies:  Allergies  Allergen Reactions  . Codeine Other (See Comments)    Hallucinations     Medications:  Scheduled: . amLODipine  5 mg Oral Daily  . benazepril  40 mg Oral Daily  . carvedilol  25 mg Oral BID WC  . enoxaparin (LOVENOX) injection  40 mg Subcutaneous Q24H  . fluticasone  2 spray Each Nare Daily  . insulin aspart  0-15 Units Subcutaneous TID WC  . metFORMIN  1,000 mg Oral BID WC  . pantoprazole  40 mg Oral Daily  . pioglitazone  30 mg Oral  Q breakfast  . sertraline  50 mg Oral Daily    Results for orders placed during the hospital encounter of 04/06/13 (from the past 48 hour(s))  CBC     Status: Abnormal   Collection Time    04/06/13 11:37 AM      Result Value Range   WBC 4.6  4.0 - 10.5 K/uL   RBC 3.69 (*) 4.22 - 5.81 MIL/uL   Hemoglobin 10.8 (*) 13.0 - 17.0 g/dL   HCT 16.1 (*) 09.6 - 04.5 %   MCV 89.4  78.0 - 100.0 fL   MCH 29.3  26.0 - 34.0 pg   MCHC 32.7  30.0 - 36.0 g/dL   RDW 40.9 (*) 81.1 - 91.4 %   Platelets 155  150 - 400 K/uL  COMPREHENSIVE METABOLIC PANEL     Status: Abnormal   Collection Time    04/06/13 11:37 AM      Result Value Range   Sodium 138  135 - 145 mEq/L   Potassium 4.3  3.5 - 5.1 mEq/L   Chloride 103  96 - 112 mEq/L   CO2 24  19 - 32 mEq/L   Glucose, Bld 134 (*) 70 - 99 mg/dL   BUN 17  6 - 23 mg/dL   Creatinine, Ser 7.82  0.50 - 1.35 mg/dL   Calcium 9.0  8.4 - 95.6 mg/dL   Total Protein 6.6  6.0 - 8.3 g/dL   Albumin 3.0 (*) 3.5 - 5.2 g/dL   AST 28  0 - 37 U/L   ALT 19  0 - 53 U/L   Alkaline Phosphatase 55  39 - 117 U/L   Total Bilirubin 0.3  0.3 - 1.2 mg/dL   GFR calc non Af Amer 67 (*) >90 mL/min   GFR calc Af Amer 78 (*) >90 mL/min   Comment: (NOTE)     The eGFR has been calculated using the CKD EPI equation.     This calculation has not been validated in all clinical situations.     eGFR's persistently <90 mL/min signify possible Chronic Kidney     Disease.  POCT I-STAT TROPONIN I     Status: None   Collection Time    04/06/13 12:52 PM      Result Value Range   Troponin i, poc 0.00  0.00 - 0.08 ng/mL   Comment 3            Comment: Due to the release kinetics of cTnI,     a negative result within the first hours     of the onset of symptoms does  not rule out     myocardial infarction with certainty.     If myocardial infarction is still suspected,     repeat the test at appropriate intervals.  GLUCOSE, CAPILLARY     Status: Abnormal   Collection Time    04/06/13  4:36  PM      Result Value Range   Glucose-Capillary 150 (*) 70 - 99 mg/dL  GLUCOSE, CAPILLARY     Status: Abnormal   Collection Time    04/06/13  8:48 PM      Result Value Range   Glucose-Capillary 163 (*) 70 - 99 mg/dL  TROPONIN I     Status: None   Collection Time    04/07/13 12:15 AM      Result Value Range   Troponin I <0.30  <0.30 ng/mL   Comment:            Due to the release kinetics of cTnI,     a negative result within the first hours     of the onset of symptoms does not rule out     myocardial infarction with certainty.     If myocardial infarction is still suspected,     repeat the test at appropriate intervals.  TROPONIN I     Status: None   Collection Time    04/07/13  6:19 AM      Result Value Range   Troponin I <0.30  <0.30 ng/mL   Comment:            Due to the release kinetics of cTnI,     a negative result within the first hours     of the onset of symptoms does not rule out     myocardial infarction with certainty.     If myocardial infarction is still suspected,     repeat the test at appropriate intervals.  GLUCOSE, CAPILLARY     Status: Abnormal   Collection Time    04/07/13  7:26 AM      Result Value Range   Glucose-Capillary 149 (*) 70 - 99 mg/dL    Dg Chest 2 View  12/01/8117   *RADIOLOGY REPORT*  Clinical Data: Chest pain  CHEST - 2 VIEW  Comparison: 06/06/2011  Findings: Heart size is normal.  Large hiatal hernia is again demonstrated.  Mediastinal structures are otherwise unremarkable. The lungs are clear.  The vascularity is normal.  No effusions.  No significant bony finding.  IMPRESSION: Large hiatal hernia again demonstrated.  No active disease.   Original Report Authenticated By: Paulina Fusi, M.D.   The patient has a history of hiatal hernia with dyspepsia and is on proton pump inhibitor.  The patient has a history of sleep apnea which may be predisposing him to paroxysmal atrial fibrillation. Blood pressure 153/87, pulse 70, temperature 97.5 F  (36.4 C), temperature source Oral, resp. rate 18, height 6\' 3"  (1.905 m), weight 261 lb 9.6 oz (118.661 kg), SpO2 96.00%. The patient appears to be in no distress.  Head and neck exam reveals that the pupils are equal and reactive.  The extraocular movements are full.  There is no scleral icterus.  Mouth and pharynx are benign.  No lymphadenopathy.  No carotid bruits.  The jugular venous pressure is normal.  Thyroid is not enlarged or tender.  Chest is clear to percussion and auscultation.  No rales or rhonchi.  Expansion of the chest is symmetrical.  Heart reveals no abnormal lift or heave.  First and  second heart sounds are normal.  There is no murmur gallop rub or click.  The abdomen is soft and nontender.  Bowel sounds are normoactive.  There is no hepatosplenomegaly or mass.  There are no abdominal bruits.  Extremities reveal no phlebitis or edema.  Pedal pulses are good.  There is no cyanosis or clubbing.  Neurologic exam is normal strength and no lateralizing weakness.  No sensory deficits.  Integument reveals no rash EKG on admission shows normal sinus rhythm with AV block and left bundle branch block. Two-dimensional echocardiogram is being done this morning.  Assessment/Plan: 1. paroxysmal atrial fibrillation 2. diabetes mellitus type 2 3. essential hypertension 4. chest pain probably secondary to rapid heart rate.  No evidence of myocardial infarction by enzymes which are negative.  Of note is recent adenosine Myoview stress test on 05/26/12 showing no evidence of ischemia and ejection fraction 63%.  Cardiac catheterization 01/24/04 showed minimal coronary artery irregularities and normal LV function. 5. history of anemia secondary to vitamin B12 deficiency 6. history of hiatal hernia.  Patient denies any history of GI bleeding. 7. left bundle branch block, old 8.  Obstructive sleep apnea which may also be predisposing him to cardiac arrhythmias  Recommendation: We will switch  him from carvedilol to metoprolol tartrate for prevention of recurrent atrial fibrillation.  He has a Chads-Vasc score of 4 ( age, diabetes mellitus, and high blood pressure).  He will benefit from long-term anticoagulation will start Xarelto.  Would suggest observing him for at least another night in the hospital and then increase activity.  He may need up titration of his beta blocker before discharge.           Cassell Clement 04/07/2013, 10:54 AM

## 2013-04-07 NOTE — Progress Notes (Signed)
Utilization review completed.  

## 2013-04-08 DIAGNOSIS — I10 Essential (primary) hypertension: Secondary | ICD-10-CM

## 2013-04-08 DIAGNOSIS — I4891 Unspecified atrial fibrillation: Principal | ICD-10-CM | POA: Diagnosis present

## 2013-04-08 LAB — GLUCOSE, CAPILLARY
Glucose-Capillary: 129 mg/dL — ABNORMAL HIGH (ref 70–99)
Glucose-Capillary: 106 mg/dL — ABNORMAL HIGH (ref 70–99)
Glucose-Capillary: 140 mg/dL — ABNORMAL HIGH (ref 70–99)

## 2013-04-08 LAB — TSH: TSH: 2.51 u[IU]/mL (ref 0.350–4.500)

## 2013-04-08 MED ORDER — METOPROLOL TARTRATE 1 MG/ML IV SOLN
INTRAVENOUS | Status: AC
  Administered 2013-04-08: 5 mg via INTRAVENOUS
  Filled 2013-04-08: qty 5

## 2013-04-08 MED ORDER — METOPROLOL TARTRATE 50 MG PO TABS
50.0000 mg | ORAL_TABLET | Freq: Three times a day (TID) | ORAL | Status: DC
  Administered 2013-04-08 – 2013-04-09 (×3): 50 mg via ORAL
  Filled 2013-04-08 (×6): qty 1

## 2013-04-08 MED ORDER — METOPROLOL TARTRATE 1 MG/ML IV SOLN
5.0000 mg | INTRAVENOUS | Status: DC | PRN
  Filled 2013-04-08: qty 5

## 2013-04-08 MED ORDER — METOPROLOL TARTRATE 25 MG PO TABS
25.0000 mg | ORAL_TABLET | Freq: Three times a day (TID) | ORAL | Status: DC
  Administered 2013-04-08: 25 mg via ORAL
  Filled 2013-04-08 (×5): qty 1

## 2013-04-08 MED ORDER — AMIODARONE HCL 200 MG PO TABS
400.0000 mg | ORAL_TABLET | Freq: Two times a day (BID) | ORAL | Status: DC
  Administered 2013-04-08 – 2013-04-09 (×3): 400 mg via ORAL
  Filled 2013-04-08 (×4): qty 2

## 2013-04-08 MED ORDER — METFORMIN HCL 500 MG PO TABS
1000.0000 mg | ORAL_TABLET | Freq: Two times a day (BID) | ORAL | Status: DC
  Administered 2013-04-08: 1000 mg via ORAL
  Filled 2013-04-08 (×3): qty 2

## 2013-04-08 NOTE — Progress Notes (Signed)
TELEMETRY: Reviewed telemetry pt in NSR with frequent runs of atrial fibrillation/flutter rates up to 140 bpm: Filed Vitals:   04/07/13 2230 04/08/13 0328 04/08/13 0400 04/08/13 0512  BP:  112/75 105/74 132/61  Pulse: 76 136  68  Temp:    97.7 F (36.5 C)  TempSrc:    Oral  Resp: 16   18  Height:      Weight:      SpO2: 96% 97%  94%    Intake/Output Summary (Last 24 hours) at 04/08/13 0929 Last data filed at 04/08/13 0818  Gross per 24 hour  Intake      0 ml  Output   1375 ml  Net  -1375 ml    SUBJECTIVE Patient experiencing symptoms of rapid palpitations associated with lightheadedness, chest pressure. Also complains of gas and belching.  LABS: Basic Metabolic Panel:  Recent Labs  16/10/96 1137  NA 138  K 4.3  CL 103  CO2 24  GLUCOSE 134*  BUN 17  CREATININE 1.02  CALCIUM 9.0   Liver Function Tests:  Recent Labs  04/06/13 1137  AST 28  ALT 19  ALKPHOS 55  BILITOT 0.3  PROT 6.6  ALBUMIN 3.0*   CBC:  Recent Labs  04/06/13 1137  WBC 4.6  HGB 10.8*  HCT 33.0*  MCV 89.4  PLT 155   Cardiac Enzymes:  Recent Labs  04/07/13 0015 04/07/13 0619 04/07/13 1125  TROPONINI <0.30 <0.30 <0.30    Radiology/Studies:  Dg Chest 2 View  04/06/2013   *RADIOLOGY REPORT*  Clinical Data: Chest pain  CHEST - 2 VIEW  Comparison: 06/06/2011  Findings: Heart size is normal.  Large hiatal hernia is again demonstrated.  Mediastinal structures are otherwise unremarkable. The lungs are clear.  The vascularity is normal.  No effusions.  No significant bony finding.  IMPRESSION: Large hiatal hernia again demonstrated.  No active disease.   Original Report Authenticated By: Paulina Fusi, M.D.   Ecg; Atrial flutter rate 136 bpm. LBBB.  Echo:Study Conclusions  Left ventricle: The cavity size was normal. Wall thickness was increased in a pattern of moderate LVH. Systolic function was normal. The estimated ejection fraction was in the range of 55% to 60%. Wall motion  was normal; there were no regional wall motion abnormalities. There was an increased relative contribution of atrial contraction to ventricular filling.   PHYSICAL EXAM General: Well developed, obese, in no acute distress. Head: Normocephalic, atraumatic, sclera non-icteric, no xanthomas, nares are without discharge. Neck: Negative for carotid bruits. JVD not elevated. Lungs: Clear bilaterally to auscultation without wheezes, rales, or rhonchi. Breathing is unlabored. Heart: RRR S1 S2 without murmurs, rubs, or gallops.  Abdomen: Soft, non-tender, non-distended with normoactive bowel sounds. No hepatomegaly. No rebound/guarding. No obvious abdominal masses. Msk:  Strength and tone appears normal for age. Extremities: No clubbing, cyanosis or edema.  Distal pedal pulses are 2+ and equal bilaterally. Neuro: Alert and oriented X 3. Moves all extremities spontaneously. Psych:  Responds to questions appropriately with a normal affect.  ASSESSMENT AND PLAN: 1. Atrial fibrillation/flutter- paroxysmal with RVR despite beta blocker. Patient is symptomatic. Echo is unremarkable. Prior stress test in October was normal. Will continue anticoagulation with Xarelto. Continue beta blocker. Will start amiodarone po at 400 mg bid. Will need to continue on telemetry for at least 48 hours. Will check TSH. If patient does not respond to antiarrhythmic drug therapy may be a candidate for ablation. 2. HTN 3. GERD on Protonix. 4. OSA on CPAP  5. DM 6. LBBB  Principal Problem:   Chest pain Active Problems:   HTN (hypertension)   Sleep apnea   Diabetes mellitus   Hiatal hernia    Signed, Arnetra Terris [place] MD,FACC 04/08/2013 9:35 AM

## 2013-04-08 NOTE — Progress Notes (Signed)
Placed pt. On cpap. Pt. Is tolerating well at this time.  

## 2013-04-08 NOTE — Progress Notes (Signed)
Pt walked to bathroom and HR 136 sustaining for few minutes then HR would drop to 70-80's for less than 30 sec and would go back up to the 130's.  BP 112/75. Pt c/o Headache, and feeling sweaty. EKG obtained and cardiology MD, Paulo Fruit, called. New orders obtained.   0400 bp105/74 after ivmetoprolol given. Will continue to monitor.

## 2013-04-08 NOTE — Progress Notes (Signed)
Pt HR 130-140's when walking to the bathroom. Pt did state he felt his heart was "pumping a little harder". Pt PM Metoprolol given prior to episode. Pt got back to bed and within a few minutes HR came down to the 80's. Will continue to monitor.

## 2013-04-08 NOTE — Progress Notes (Signed)
PATIENT DETAILS Name: Richard [name] Age: 79 y.o. Sex: male Date of Birth: July 01, 1931 Admit Date: 04/06/2013 Admitting Physician Drema Dallas, MD ZOX:[id number] R, MD  Subjective: Continues to have episodes of A. fib with heart rate in the 140s range.  Assessment/Plan: Principal Problem: Atrial fibrillation with rapid ventricular response - Patient and wife give a history of palpitations over the past few days-since Monday, per patient's wife heart rate at home has been in the 130s to 140s range at times. Patient was admitted and placed on telemetry, review of telemetry strips from last evening does show an irregular rhythm consistent with atrial fibrillation. Cardiology was then consulted. - Has now been seen by cardiology, started on amiodarone, we'll increase metoprolol to 50 mg 3 times a day. Stop amlodipine for now. - He also has a Chads-Vasc score of 4- cardiology has started patient on Xarelto. - Continue telemetry monitoring, will require titration of rate control medications  Active Problems: - Chest pain - Cardiac enzymes negative, 2-D echocardiogram showed EF around 55-60% with no wall motion abnormalities. - Nuclear stress test done in October of 2013 negative - Cardiology not planning on any further workup at this time.    HTN (hypertension) - BP currently controlled with metoprolol and benazepril, we'll stop amlodipine as dosing of metoprolol being increased    Diabetes mellitus - CBGs stable, continue with Actos and sliding scale regimen    Hiatal hernia/GERD - Continue with PPI  Disposition: Remain inpatient  DVT Prophylaxis: Not needed as on Xarelto  Code Status: Full code   Family Communication Spouse at bedside  Procedures:  None  CONSULTS:  cardiology   MEDICATIONS: Scheduled Meds: . amiodarone  400 mg Oral BID  . benazepril  40 mg Oral Daily  . fluticasone  2 spray Each Nare Daily  . insulin aspart  0-15 Units Subcutaneous TID WC   . metoprolol tartrate  50 mg Oral Q8H  . pantoprazole  40 mg Oral Daily  . pioglitazone  30 mg Oral Q breakfast  . rivaroxaban  20 mg Oral Q supper  . sertraline  50 mg Oral QHS   Continuous Infusions:  PRN Meds:.acetaminophen, diazepam, metoprolol, ondansetron (ZOFRAN) IV  Antibiotics: Anti-infectives   None       PHYSICAL EXAM: Vital signs in last 24 hours: Filed Vitals:   04/07/13 2230 04/08/13 0328 04/08/13 0400 04/08/13 0512  BP:  112/75 105/74 132/61  Pulse: 76 136  68  Temp:    97.7 F (36.5 C)  TempSrc:    Oral  Resp: 16   18  Height:      Weight:      SpO2: 96% 97%  94%    Weight change:  Filed Weights   04/06/13 1545  Weight: 118.661 kg (261 lb 9.6 oz)   Body mass index is 32.7 kg/(m^2).   Gen Exam: Awake and alert with clear speech.   Neck: Supple, No JVD.   Chest: B/L Clear.   CVS: S1 S2 Regular, no murmurs.  Abdomen: soft, BS +, non tender, non distended.  Extremities: no edema, lower extremities warm to touch. Neurologic: Non Focal.   Skin: No Rash.   Wounds: N/A.    Intake/Output from previous day:  Intake/Output Summary (Last 24 hours) at 04/08/13 1318 Last data filed at 04/08/13 0818  Gross per 24 hour  Intake      0 ml  Output   1375 ml  Net  -1375 ml     LAB RESULTS:  CBC  Recent Labs Lab 04/06/13 1137  WBC 4.6  HGB 10.8*  HCT 33.0*  PLT 155  MCV 89.4  MCH 29.3  MCHC 32.7  RDW 15.9*    Chemistries   Recent Labs Lab 04/06/13 1137  NA 138  K 4.3  CL 103  CO2 24  GLUCOSE 134*  BUN 17  CREATININE 1.02  CALCIUM 9.0    CBG:  Recent Labs Lab 04/07/13 1123 04/07/13 1635 04/07/13 2137 04/08/13 0742 04/08/13 1130  GLUCAP 133* 167* 127* 129* 106*    GFR Estimated Creatinine Clearance: 80.2 ml/min (by C-G formula based on Cr of 1.02).  Coagulation profile No results found for this basename: INR, PROTIME,  in the last 168 hours  Cardiac Enzymes  Recent Labs Lab 04/07/13 0015 04/07/13 0619  04/07/13 1125  TROPONINI <0.30 <0.30 <0.30    No components found with this basename: POCBNP,  No results found for this basename: DDIMER,  in the last 72 hours No results found for this basename: HGBA1C,  in the last 72 hours No results found for this basename: CHOL, HDL, LDLCALC, TRIG, CHOLHDL, LDLDIRECT,  in the last 72 hours No results found for this basename: TSH, T4TOTAL, FREET3, T3FREE, THYROIDAB,  in the last 72 hours No results found for this basename: VITAMINB12, FOLATE, FERRITIN, TIBC, IRON, RETICCTPCT,  in the last 72 hours No results found for this basename: LIPASE, AMYLASE,  in the last 72 hours  Urine Studies No results found for this basename: UACOL, UAPR, USPG, UPH, UTP, UGL, UKET, UBIL, UHGB, UNIT, UROB, ULEU, UEPI, UWBC, URBC, UBAC, CAST, CRYS, UCOM, BILUA,  in the last 72 hours  MICROBIOLOGY: No results found for this or any previous visit (from the past 240 hour(s)).  RADIOLOGY STUDIES/RESULTS: Dg Chest 2 View  04/06/2013   *RADIOLOGY REPORT*  Clinical Data: Chest pain  CHEST - 2 VIEW  Comparison: 06/06/2011  Findings: Heart size is normal.  Large hiatal hernia is again demonstrated.  Mediastinal structures are otherwise unremarkable. The lungs are clear.  The vascularity is normal.  No effusions.  No significant bony finding.  IMPRESSION: Large hiatal hernia again demonstrated.  No active disease.   Original Report Authenticated By: Paulina Fusi, M.D.    Jeoffrey Massed, MD  [facility] Pager:[phone]  If 7PM-7AM, please contact night-coverage [email or web address] Password [email or web address] 04/08/2013, 1:18 PM   LOS: 2 days

## 2013-04-08 NOTE — Evaluation (Signed)
Physical Therapy Evaluation Patient Details Name: Richard [name] MRN: [id number] DOB: Oct 13, 1930 Today's Date: 04/08/2013 Time: 2725-3664 PT Time Calculation (min): 30 min  PT Assessment / Plan / Recommendation History of Present Illness   79 y.o. male PMHx HTN,, DM, sleep apnea, hiatal hernia. presents with lower anterior substernal chest pain for two days, (+) SOB, negative nausea vomiting negative back pain negative fever negative radiation, (+) Presyncope. Pt has had similar but not this severe. Started after eating [employer]. Ache, constant, improves with belching. Pt has known hernia. At pcp office pt hr varying from 70 to 140. No known arrhythmias. Mild diaphoresis. No exertional sxs. I-STAT troponin negative. Heart score= 4   Clinical Impression  Pt found to have moderate risk factor for fall due to musculoskeletal weakness presenting as instability during higher level/functional activities.  Based on established parameters, pt is at or near cutoff for fall risk.  Recommend pt seek OPPT for continued balance retraining to maintain/improve balance and mobility ability and independence with ADLs.  MD please write Rx for OPPT.  No other needs at this time, will sign off acutely and defer general mobility needs to nursing staff as cardiac work up continues.  (Pt asymptomatic with no increased HR this evaluation.)    PT Assessment  All further PT needs can be met in the next venue of care    Follow Up Recommendations  Outpatient PT    Does the patient have the potential to tolerate intense rehabilitation      Barriers to Discharge        Equipment Recommendations  None recommended by PT    Recommendations for Other Services     Frequency      Precautions / Restrictions Precautions Precautions: Fall Precaution Comments: higher level balance deficit with potential for falls   Pertinent Vitals/Pain 60-70 bpm per monitor      Mobility  Bed Mobility Bed Mobility: Supine to  Sit;Sit to Supine Supine to Sit: 7: Independent Sit to Supine: 7: Independent Transfers Transfers: Sit to Stand;Stand to Sit Sit to Stand: 7: Independent Stand to Sit: 7: Independent Details for Transfer Assistance: able to perform stand strong/sit slow with minimal instruction and to stand/sit without use of UE after repeated trials Ambulation/Gait Ambulation/Gait Assistance: 6: Modified independent (Device/Increase time) Ambulation Distance (Feet): 100 Feet Assistive device: None Gait Pattern: Step-through pattern;Narrow base of support    Exercises     PT Diagnosis: Difficulty walking  PT Problem List: Cardiopulmonary status limiting activity;Decreased balance;Decreased mobility PT Treatment Interventions:       PT Goals(Current goals can be found in the care plan section) Acute Rehab PT Goals Patient Stated Goal: figure out why my heart races PT Goal Formulation: No goals set, d/c therapy  Visit Information  Last PT Received On: 04/08/13 Assistance Needed: +1       Prior Functioning  Home Living Family/patient expects to be discharged to:: Private residence Living Arrangements: Spouse/significant other Available Help at Discharge: Family;Available 24 hours/day Type of Home: House Home Access: Stairs to enter [employer] of Steps: 2 Entrance Stairs-Rails: None Home Layout: Laundry or work area in basement;Able to live on main level with bedroom/bathroom;Two level Alternate Level Stairs-Number of Steps: flight, pt does not go to basement Alternate Level Stairs-Rails: Right Prior Function Level of Independence: Independent Communication Communication: No difficulties    Cognition  Cognition Arousal/Alertness: Awake/alert Behavior During Therapy: WFL for tasks assessed/performed Overall Cognitive Status: Within Functional Limits for tasks assessed    Extremity/Trunk  Assessment Upper Extremity Assessment Upper Extremity Assessment: Overall WFL for  tasks assessed Lower Extremity Assessment Lower Extremity Assessment: Overall WFL for tasks assessed Cervical / Trunk Assessment Cervical / Trunk Assessment: Normal   Balance Balance Balance Assessed: Yes Static Standing Balance Single Leg Stance - Right Leg: 0 Tandem Stance - Right Leg: 15 Rhomberg - Eyes Opened: 30 Rhomberg - Eyes Closed: 30 (marked increased sway, forefoot abducted) Standardized Balance Assessment Standardized Balance Assessment: Dynamic Gait Index Dynamic Gait Index Level Surface: Normal Change in Gait Speed: Normal Gait with Horizontal Head Turns: Mild Impairment Gait with Vertical Head Turns: Mild Impairment Gait and Pivot Turn: Mild Impairment Step Over Obstacle: Mild Impairment Step Around Obstacles: Normal Steps: Normal Total Score: 20 High Level Balance High Level Balance Comments: presents with some indicators for fall risk including unable to perform single leg stance and increased sway with Rhomberg eyes closed and with head turns (but no problem with turning 180 degree).  Educated on balance system functions and risk for fall at night (instructed to place nightlights in outlets for increase visibility at night); risk due to ankle and leg weakness (instructed in supported single leg stance and sit slow/stand strong for improved LE functional strength).  Recommended OPPT for continued guided therapy to maintain/improve functional mobility and balance ability.  Pt assured that he is NOT "too old to do much" and would benefit from PT for improved confidence with mobility ADLs.  End of Session PT - End of Session Activity Tolerance: Patient tolerated treatment well Patient left: in bed;with call bell/phone within reach;with family/visitor present Nurse Communication: Mobility status  GP Functional Assessment Tool Used: clinical judgement Functional Limitation: Mobility: Walking and moving around Mobility: Walking and Moving Around Current Status ([id number]): At  least 20 percent but less than 40 percent impaired, limited or restricted Mobility: Walking and Moving Around Goal Status [phone]): At least 1 percent but less than 20 percent impaired, limited or restricted Mobility: Walking and Moving Around Discharge Status [phone]): At least 1 percent but less than 20 percent impaired, limited or restricted   Dennis Bast 04/08/2013, 10:07 AM

## 2013-04-09 LAB — GLUCOSE, CAPILLARY: Glucose-Capillary: 129 mg/dL — ABNORMAL HIGH (ref 70–99)

## 2013-04-09 MED ORDER — AMIODARONE HCL 200 MG PO TABS: 400.0000 mg | ORAL_TABLET | Freq: Two times a day (BID) | ORAL | Status: DC

## 2013-04-09 MED ORDER — RIVAROXABAN 20 MG PO TABS: 20.0000 mg | ORAL_TABLET | Freq: Every day | ORAL | Status: DC

## 2013-04-09 MED ORDER — METOPROLOL TARTRATE 50 MG PO TABS: 50.0000 mg | ORAL_TABLET | Freq: Three times a day (TID) | ORAL | Status: DC

## 2013-04-09 NOTE — Progress Notes (Signed)
   CARE MANAGEMENT NOTE 04/09/2013  Patient:  Richard [name], Richard [name]   Account Number:  [id number]  Date Initiated:  04/09/2013  Documentation initiated by:  [facility]  Subjective/Objective Assessment:   adm w/Atrial Fibrillation     Action/Plan:   Anticipated DC Date:  04/09/2013   Anticipated DC Plan:        DC Planning Services  CM consult      Choice offered to / List presented to:             Status of service:  Completed, signed off Medicare Important Message given?   (If response is "NO", the following Medicare IM given date fields will be blank) Date Medicare IM given:   Date Additional Medicare IM given:    Discharge Disposition:  HOME/SELF CARE  Per UR Regulation:    If discussed at Long Length of Stay Meetings, dates discussed:    Comments:  04-09-13 00:30 RN called CM to help set up outpt therapy; call into pt's room and spoke with pt's wife who requested [facility].  Contact information and telephone with instruction to call 04-11-13, Monday, to set up outpatient rehab.  Outpt rehab order faxed to [facility]. Freddy Jaksch, BSN, CM

## 2013-04-09 NOTE — Progress Notes (Signed)
Patient ID: ROYE GUSTAFSON, male   DOB: 07/19/1931, 79 y.o.   MRN: [id number]   Patient Name: Richard [name] Date of Encounter: 04/09/2013    SUBJECTIVE  Patient is feeling better this morning. Currently in sinus bradycardia 56 beats per minute. No recurrent atrial fib since starting more aggressive regimen.  CURRENT MEDS . amiodarone  400 mg Oral BID  . benazepril  40 mg Oral Daily  . fluticasone  2 spray Each Nare Daily  . insulin aspart  0-15 Units Subcutaneous TID WC  . metoprolol tartrate  50 mg Oral Q8H  . pantoprazole  40 mg Oral Daily  . pioglitazone  30 mg Oral Q breakfast  . rivaroxaban  20 mg Oral Q supper  . sertraline  50 mg Oral QHS    OBJECTIVE  Filed Vitals:   04/08/13 2204 04/09/13 0457 04/09/13 0553 04/09/13 0703  BP: 153/80 173/75 175/72 149/76  Pulse: 66 59 62 64  Temp:  97.5 F (36.4 C)    TempSrc:  Oral    Resp:  18    Height:      Weight:      SpO2:  95%      Intake/Output Summary (Last 24 hours) at 04/09/13 0955 Last data filed at 04/09/13 0900  Gross per 24 hour  Intake   1080 ml  Output    400 ml  Net    680 ml   Filed Weights   04/06/13 1545  Weight: 261 lb 9.6 oz (118.661 kg)    PHYSICAL EXAM  General: Pleasant, NAD. Obese Neuro: Alert and oriented X 3. Moves all extremities spontaneously. Psych: Normal affect. HEENT:  Normal  Neck: Supple without bruits or JVD. Lungs:  Resp regular and unlabored, CTA. Heart: RRR no s3, s4, or murmurs. Abdomen: Soft, non-tender, non-distended, BS + x 4.  Extremities: No clubbing, cyanosis or edema. DP/PT/Radials 2+ and equal bilaterally.  Accessory Clinical Findings  CBC  Recent Labs  04/06/13 1137  WBC 4.6  HGB 10.8*  HCT 33.0*  MCV 89.4  PLT 155   Basic Metabolic Panel  Recent Labs  04/06/13 1137  NA 138  K 4.3  CL 103  CO2 24  GLUCOSE 134*  BUN 17  CREATININE 1.02  CALCIUM 9.0   Liver Function Tests  Recent Labs  04/06/13 1137  AST 28  ALT 19  ALKPHOS 55    BILITOT 0.3  PROT 6.6  ALBUMIN 3.0*   No results found for this basename: LIPASE, AMYLASE,  in the last 72 hours Cardiac Enzymes  Recent Labs  04/07/13 0015 04/07/13 0619 04/07/13 1125  TROPONINI <0.30 <0.30 <0.30   BNP No components found with this basename: POCBNP,  D-Dimer No results found for this basename: DDIMER,  in the last 72 hours Hemoglobin A1C No results found for this basename: HGBA1C,  in the last 72 hours Fasting Lipid Panel No results found for this basename: CHOL, HDL, LDLCALC, TRIG, CHOLHDL, LDLDIRECT,  in the last 72 hours Thyroid Function Tests  Recent Labs  04/08/13 0951  TSH 2.510    TELE  Sinus bradycardia  ECG    Radiology/Studies  Dg Chest 2 View  04/06/2013   *RADIOLOGY REPORT*  Clinical Data: Chest pain  CHEST - 2 VIEW  Comparison: 06/06/2011  Findings: Heart size is normal.  Large hiatal hernia is again demonstrated.  Mediastinal structures are otherwise unremarkable. The lungs are clear.  The vascularity is normal.  No effusions.  No significant bony finding.  IMPRESSION: Large hiatal hernia again demonstrated.  No active disease.   Original Report Authenticated By: Paulina Fusi, M.D.    ASSESSMENT AND PLAN  Principal Problem:   Atrial fibrillation Active Problems:   Chest pain   HTN (hypertension)   Sleep apnea   Diabetes mellitus   Hiatal hernia   Maintaining sinus rhythm on amiodarone and higher dose Lopressor. Carvedilol has been discontinued. Is on anticoagulation. I've discussed with the patient and the family as well as Dr Jerral Ralph. We'll discharge home on current regimen and arrange for followup within the next 10 days with Dr. Elease Hashimoto. Signed, Valera Castle MD

## 2013-04-09 NOTE — Discharge Summary (Signed)
PATIENT DETAILS Name: Richard [name] Age: 79 y.o. Sex: male Date of Birth: [birth year] MRN: [id number]. Admit Date: 04-May-2013 Admitting Physician: Drema Dallas, MD EXB:[id number] R, MD  Recommendations for Outpatient Follow-up:  1. Please adjust Amiodarone dosage at next visit with Cardioogy 2. Please adjust Metoprolol dosage at next visit with Cardiology  PRIMARY DISCHARGE DIAGNOSIS:  Principal Problem:   Atrial fibrillation Active Problems:   Chest pain   HTN (hypertension)   Sleep apnea   Diabetes mellitus   Hiatal hernia      PAST MEDICAL HISTORY: Past Medical History  Diagnosis Date  . Hypertension   . Anxiety   . Microcytic anemia     as well that must be iron deficiency  . Chest pain   . Hiatal hernia   . Skin cancer of lip     "lower" (05/04/13)  . Sinus drainage     "all the time" (05/04/13)  . OSA on CPAP   . Type II diabetes mellitus   . History of blood transfusion     "not related to OR; blood count was low" (05-04-13)  . GERD (gastroesophageal reflux disease)   . Daily headache     "last 2 days" (05/04/2013)  . Arthritis     "a little in my back" (May 04, 2013)  . Chronic lower back pain     "if I lift at all" (05/04/2013)  . Nephrolithiasis     "one years ago; passed on it's own" (05-04-13)  . On home oxygen therapy     "2L w/CPAP at night" (05-04-13)  . [place] spotted fever ~ 1945    DISCHARGE MEDICATIONS:   Medication List    STOP taking these medications       amLODipine 5 MG tablet  Commonly known as:  NORVASC     carvedilol 25 MG tablet  Commonly known as:  COREG      TAKE these medications       amiodarone 200 MG tablet  Commonly known as:  PACERONE  Take 2 tablets (400 mg total) by mouth 2 (two) times daily.     benazepril 40 MG tablet  Commonly known as:  LOTENSIN  Take 40 mg by mouth daily.     diazepam 5 MG tablet  Commonly known as:  VALIUM  Take 5 mg by mouth at bedtime.     fluticasone 50  MCG/ACT nasal spray  Commonly known as:  FLONASE  Place 1 spray into the nose daily.     metFORMIN 1000 MG tablet  Commonly known as:  GLUCOPHAGE  Take 1,000 mg by mouth 2 (two) times daily with a meal.     metoprolol 50 MG tablet  Commonly known as:  LOPRESSOR  Take 1 tablet (50 mg total) by mouth every 8 (eight) hours.     omeprazole 20 MG capsule  Commonly known as:  PRILOSEC  Take 20 mg by mouth 2 (two) times daily.     pioglitazone 30 MG tablet  Commonly known as:  ACTOS  Take 30 mg by mouth daily.     Rivaroxaban 20 MG Tabs tablet  Commonly known as:  XARELTO  Take 1 tablet (20 mg total) by mouth daily with supper.     sertraline 50 MG tablet  Commonly known as:  ZOLOFT  Take 50 mg by mouth daily.     VITAMIN B-12 IJ  Inject as directed every 30 (thirty) days.        ALLERGIES:   Allergies  Allergen Reactions  . Codeine Other (See Comments)    Hallucinations     BRIEF HPI:  See H&P, Labs, Consult and Test reports for all details in brief, patient was admitted for chest pain and palpitations  CONSULTATIONS:   cardiology  PERTINENT RADIOLOGIC STUDIES: Dg Chest 2 View  04/06/2013   *RADIOLOGY REPORT*  Clinical Data: Chest pain  CHEST - 2 VIEW  Comparison: 06/06/2011  Findings: Heart size is normal.  Large hiatal hernia is again demonstrated.  Mediastinal structures are otherwise unremarkable. The lungs are clear.  The vascularity is normal.  No effusions.  No significant bony finding.  IMPRESSION: Large hiatal hernia again demonstrated.  No active disease.   Original Report Authenticated By: Paulina Fusi, M.D.     PERTINENT LAB RESULTS: CBC:  Recent Labs  04/06/13 1137  WBC 4.6  HGB 10.8*  HCT 33.0*  PLT 155   CMET CMP     Component Value Date/Time   NA 138 04/06/2013 1137   K 4.3 04/06/2013 1137   CL 103 04/06/2013 1137   CO2 24 04/06/2013 1137   GLUCOSE 134* 04/06/2013 1137   BUN 17 04/06/2013 1137   CREATININE 1.02 04/06/2013 1137   CALCIUM  9.0 04/06/2013 1137   PROT 6.6 04/06/2013 1137   ALBUMIN 3.0* 04/06/2013 1137   AST 28 04/06/2013 1137   ALT 19 04/06/2013 1137   ALKPHOS 55 04/06/2013 1137   BILITOT 0.3 04/06/2013 1137   GFRNONAA 67* 04/06/2013 1137   GFRAA 78* 04/06/2013 1137    GFR Estimated Creatinine Clearance: 80.2 ml/min (by C-G formula based on Cr of 1.02). No results found for this basename: LIPASE, AMYLASE,  in the last 72 hours  Recent Labs  04/07/13 0015 04/07/13 0619 04/07/13 1125  TROPONINI <0.30 <0.30 <0.30   No components found with this basename: POCBNP,  No results found for this basename: DDIMER,  in the last 72 hours No results found for this basename: HGBA1C,  in the last 72 hours No results found for this basename: CHOL, HDL, LDLCALC, TRIG, CHOLHDL, LDLDIRECT,  in the last 72 hours  Recent Labs  04/08/13 0951  TSH 2.510   No results found for this basename: VITAMINB12, FOLATE, FERRITIN, TIBC, IRON, RETICCTPCT,  in the last 72 hours Coags: No results found for this basename: PT, INR,  in the last 72 hours Microbiology: No results found for this or any previous visit (from the past 240 hour(s)).   BRIEF HOSPITAL COURSE:   Principal Problem: Atrial fibrillation with rapid ventricular response  - Patient and wife give a history of palpitations over the past few days-since Monday, per patient's wife heart rate at home has been in the 130s to 140s range at times. Patient was admitted and placed on telemetry, review of telemetry strips did show an irregular rhythm consistent with atrial fibrillation. Cardiology was then consulted.  - Has now been seen by cardiology, started on amiodarone, and  Metoprolol was increased to 50 mg 3 times a day. Coreg and amlodipine were stopped for now.  - He also has a Chads-Vasc score of 4- cardiology has started patient on Xarelto. -Patient will need follow with cardiology in 10 days to further adjust dosing of Amiodarone and Metoprolol.Dr Daleen Squibb has seen the patient  today, and has cleared the patient for discharge.  Chest pain  - Cardiac enzymes negative, 2-D echocardiogram showed EF around 55-60% with no wall motion abnormalities.  - Nuclear stress test done in October of 2013 negative  -  Cardiology not planning on any further workup at this time  HTN (hypertension)  - BP currently controlled with metoprolol and benazepril, we'll stop amlodipine as dosing of metoprolol being increased   Diabetes mellitus -resume pre-admission medications on discharge  TODAY-DAY OF DISCHARGE:  Subjective:   Cordarrel Stiefel today has no headache,no chest abdominal pain,no new weakness tingling or numbness, feels much better wants to go home today.   Objective:   Blood pressure 149/76, pulse 64, temperature 97.5 F (36.4 C), temperature source Oral, resp. rate 18, height 6\' 3"  (1.905 m), weight 118.661 kg (261 lb 9.6 oz), SpO2 95.00%.  Intake/Output Summary (Last 24 hours) at 04/09/13 1023 Last data filed at 04/09/13 0900  Gross per 24 hour  Intake   1080 ml  Output    400 ml  Net    680 ml   Filed Weights   04/06/13 1545  Weight: 118.661 kg (261 lb 9.6 oz)    Exam Awake Alert, Oriented *3, No new F.N deficits, Normal affect [place].AT,PERRAL Supple Neck,No JVD, No cervical lymphadenopathy appriciated.  Symmetrical Chest wall movement, Good air movement bilaterally, CTAB RRR,No Gallops,Rubs or new Murmurs, No Parasternal Heave +ve B.Sounds, Abd Soft, Non tender, No organomegaly appriciated, No rebound -guarding or rigidity. No Cyanosis, Clubbing or edema, No new Rash or bruise  DISCHARGE CONDITION: Stable  DISPOSITION: Home  DISCHARGE INSTRUCTIONS:    Activity:  As tolerated with Full fall precautions use walker/cane & assistance as needed  Diet recommendation: Diabetic Diet Heart Healthy diet      Discharge Orders   Future Orders Complete By Expires   Call MD for:  persistant dizziness or light-headedness  As directed    Diet - low sodium  heart healthy  As directed    Diet Carb Modified  As directed    Increase activity slowly  As directed       Follow-up Information   Follow up with Hoyle Sauer, MD. Schedule an appointment as soon as possible for a visit in 1 week.   Specialty:  Internal Medicine   Contact information:   [address] [facility] [place] [address] [phone]       Follow up with Elyn Aquas., MD. Schedule an appointment as soon as possible for a visit in 10 days.   Specialty:  Cardiology   Contact information:   [address] [place] [address] [phone]       Total Time spent on discharge equals 45 minutes.  SignedJeoffrey Massed 04/09/2013 10:23 AM

## 2013-04-15 DIAGNOSIS — E538 Deficiency of other specified B group vitamins: Secondary | ICD-10-CM | POA: Diagnosis not present

## 2013-04-18 ENCOUNTER — Encounter: Payer: Self-pay | Admitting: Cardiovascular Disease

## 2013-04-18 ENCOUNTER — Ambulatory Visit (INDEPENDENT_AMBULATORY_CARE_PROVIDER_SITE_OTHER): Payer: Medicare Other | Admitting: Cardiovascular Disease

## 2013-04-18 VITALS — BP 158/76 | HR 60 | Ht 75.0 in | Wt 261.4 lb

## 2013-04-18 DIAGNOSIS — I4891 Unspecified atrial fibrillation: Secondary | ICD-10-CM | POA: Diagnosis not present

## 2013-04-18 DIAGNOSIS — I10 Essential (primary) hypertension: Secondary | ICD-10-CM | POA: Diagnosis not present

## 2013-04-18 DIAGNOSIS — R079 Chest pain, unspecified: Secondary | ICD-10-CM | POA: Diagnosis not present

## 2013-04-18 MED ORDER — AMLODIPINE BESYLATE 2.5 MG PO TABS: 2.5000 mg | ORAL_TABLET | Freq: Every day | ORAL | Status: DC

## 2013-04-18 MED ORDER — AMIODARONE HCL 200 MG PO TABS: 200.0000 mg | ORAL_TABLET | Freq: Every day | ORAL | Status: DC

## 2013-04-18 NOTE — Progress Notes (Signed)
Fuller Plan Date of Birth  Apr 23, 1931 [facility] [address]    [address] [place], [place]  [address] [phone]  Fax  [phone]  Problem List 1. LBBB 2. HTN 3. Hyperlipidemia 4. Diabetes mellitus 5. Hiatal hernia 6. Atrial fibrillation  History of Present Illness:  Richard [name] is a 79 year old old gentleman with a history of left bundle branch block. He has a history of hypertension. He's had some episodes of chest pain. He was recently seen in the emergency room with episodes of chest pain. He had multiple troponin levels which were normal. He has CT angiogram which was negative for a PE. He was not have a very large hiatal hernia.  He had a normal cardiac catheterization in 2005. He had a normal stress Myoview study in 2009.  April 18, 2013:  Richard [name] is an 79 year old gentleman with the above-noted medical problems. He was recently admitted to the hospital with atrial fibrillation. He was started on amiodarone.  He is feeling better.   His amlodipidine was held and  BP has been  elevated.    Current Outpatient Prescriptions on File Prior to Visit  Medication Sig Dispense Refill  . amiodarone (PACERONE) 200 MG tablet Take 2 tablets (400 mg total) by mouth 2 (two) times daily.  120 tablet  0  . benazepril (LOTENSIN) 40 MG tablet Take 40 mg by mouth daily.        . Cyanocobalamin (VITAMIN B-12 IJ) Inject as directed every 30 (thirty) days.        . diazepam (VALIUM) 5 MG tablet Take 5 mg by mouth at bedtime.        . fluticasone (FLONASE) 50 MCG/ACT nasal spray Place 1 spray into the nose daily.        . metFORMIN (GLUCOPHAGE) 1000 MG tablet Take 1,000 mg by mouth 2 (two) times daily with a meal.      . metoprolol (LOPRESSOR) 50 MG tablet Take 1 tablet (50 mg total) by mouth every 8 (eight) hours.  90 tablet  0  . omeprazole (PRILOSEC) 20 MG capsule Take 20 mg by mouth 2 (two) times daily.       . pioglitazone (ACTOS) 30 MG tablet Take 30 mg by mouth daily.        .  Rivaroxaban (XARELTO) 20 MG TABS tablet Take 1 tablet (20 mg total) by mouth daily with supper.  30 tablet  0  . sertraline (ZOLOFT) 50 MG tablet Take 50 mg by mouth daily.       No current facility-administered medications on file prior to visit.    Allergies  Allergen Reactions  . Codeine Other (See Comments)    Hallucinations     Past Medical History  Diagnosis Date  . Hypertension   . Anxiety   . Microcytic anemia     as well that must be iron deficiency  . Chest pain   . Hiatal hernia   . Skin cancer of lip     "lower" (04/06/2013)  . Sinus drainage     "all the time" (04/06/2013)  . OSA on CPAP   . Type II diabetes mellitus   . History of blood transfusion     "not related to OR; blood count was low" (04/06/2013)  . GERD (gastroesophageal reflux disease)   . Daily headache     "last 2 days" (04/06/2013)  . Arthritis     "a little in my back" (04/06/2013)  . Chronic lower back pain     "  if I lift at all" (2013-04-10)  . Nephrolithiasis     "one years ago; passed on it's own" (04-10-13)  . On home oxygen therapy     "2L w/CPAP at night" (04/10/13)  . Rocky Mountain spotted fever ~ 1945    Past Surgical History  Procedure Laterality Date  . Vasectomy      Hx of   . Lumbar disc surgery  ~ 1993  . Inguinal hernia repair Right   . Cardiac catheterization      by Dr. Elease Hashimoto, January 24, 2004, that shows minimal coronary artery irregularities and normal left ventricular function  . Cataract extraction w/ intraocular lens  implant, bilateral Bilateral   . Skin cancer excision      "lower lip" (04/10/13)    History  Smoking status  . Former Smoker -- 1.00 packs/day for 20 years  . Types: Cigarettes  Smokeless tobacco  . Never Used    Comment: 10-Apr-2013 "quit smoking years ago; didn't smoke that much; probably 20 years; 1ppd"    History  Alcohol Use No    No family history on file.  Reviw of Systems:  Reviewed in the HPI.  All other systems are  negative.  Physical Exam: BP 158/76  Pulse 60  Ht 6\' 3"  (1.905 m)  Wt 261 lb 6.4 oz (118.57 kg)  BMI 32.67 kg/m2 The patient is alert and oriented x 3.  The mood and affect are normal.   Skin: warm and dry.  Color is normal.    HEENT:   Normal carotids, no JVD  Lungs: clear   Heart: distant HS. RR, no murmur , bradycardic.   Abdomen:  obese nt. Normal BS  Extremities:  Trace edema in Right leg, minimal in left.  Neuro:  Non focal    ECG: 04/18/2013: Sinus bradycardia at 52.   Left bundle branch block.  Assessment / Plan:

## 2013-04-18 NOTE — Assessment & Plan Note (Signed)
Presents today for followup of a recent hospitalization for atrial fibrillation.  He has converted back to sinus rhythm. His heart rate is slow-52 beats a minute. We will continue with the Xarelto.   We will decrease the dose of amiodarone to 200 mg once a day. We will see him back in the office in 3 months. We'll have him continue to check his heart rate and blood pressure. We may be able to decrease the amiodarone even further.

## 2013-04-18 NOTE — Patient Instructions (Addendum)
Your physician recommends that you schedule a follow-up appointment in: 3 months   Your physician recommends that you return for a FASTING lipid profile: 3 MONTHS    Your physician has recommended you make the following change in your medication:   DECREASE AMIODORONE TO 200 MG DAILY RESTART AMLODIPINE 2.5 MG DAILY    REDUCE HIGH SODIUM FOODS LIKE CANNED SOUP, GRAVY, SAUCES, READY PREPARED FOODS LIKE FROZEN FOODS; LEAN CUISINE, LASAGNA. BACON, SAUSAGE, LUNCH MEAT, FAST FOODS, HOT DOGS, CHIPS, PIZZA.   DASH Diet The DASH diet stands for "Dietary Approaches to Stop Hypertension." It is a healthy eating plan that has been shown to reduce high blood pressure (hypertension) in as little as 14 days, while also possibly providing other significant health benefits. These other health benefits include reducing the risk of breast cancer after menopause and reducing the risk of type 2 diabetes, heart disease, colon cancer, and stroke. Health benefits also include weight loss and slowing kidney failure in patients with chronic kidney disease.  DIET GUIDELINES  Limit salt (sodium). Your diet should contain less than 1500 mg of sodium daily.  Limit refined or processed carbohydrates. Your diet should include mostly whole grains. Desserts and added sugars should be used sparingly.  Include small amounts of heart-healthy fats. These types of fats include nuts, oils, and tub margarine. Limit saturated and trans fats. These fats have been shown to be harmful in the body. CHOOSING FOODS  The following food groups are based on a 2000 calorie diet. See your Registered Dietitian for individual calorie needs. Grains and Grain Products (6 to 8 servings daily)  Eat More Often: Whole-wheat bread, brown rice, whole-grain or wheat pasta, quinoa, popcorn without added fat or salt (air popped).  Eat Less Often: White bread, white pasta, white rice, cornbread. Vegetables (4 to 5 servings daily)  Eat More Often:  Fresh, frozen, and canned vegetables. Vegetables may be raw, steamed, roasted, or grilled with a minimal amount of fat.  Eat Less Often/Avoid: Creamed or fried vegetables. Vegetables in a cheese sauce. Fruit (4 to 5 servings daily)  Eat More Often: All fresh, canned (in natural juice), or frozen fruits. Dried fruits without added sugar. One hundred percent fruit juice ( cup [237 mL] daily).  Eat Less Often: Dried fruits with added sugar. Canned fruit in light or heavy syrup. [employer], Fish, and Poultry (2 servings or less daily. One serving is 3 to 4 oz [85-114 g]).  Eat More Often: Ninety percent or leaner ground beef, tenderloin, sirloin. Round cuts of beef, chicken breast, [place] breast. All fish. Grill, bake, or broil your meat. Nothing should be fried.  Eat Less Often/Avoid: Fatty cuts of meat, [place], or chicken leg, thigh, or wing. Fried cuts of meat or fish. Dairy (2 to 3 servings)  Eat More Often: Low-fat or fat-free milk, low-fat plain or light yogurt, reduced-fat or part-skim cheese.  Eat Less Often/Avoid: Milk (whole, 2%).Whole milk yogurt. Full-fat cheeses. Nuts, Seeds, and Legumes (4 to 5 servings per week)  Eat More Often: All without added salt.  Eat Less Often/Avoid: Salted nuts and seeds, canned beans with added salt. Fats and Sweets (limited)  Eat More Often: Vegetable oils, tub margarines without trans fats, sugar-free gelatin. Mayonnaise and salad dressings.  Eat Less Often/Avoid: Coconut oils, palm oils, butter, stick margarine, cream, half and half, cookies, candy, pie. FOR MORE INFORMATION The Dash Diet Eating Plan: www.dashdiet.org Document Released: 07/31/2011 Document Revised: 11/03/2011 Document Reviewed: 07/31/2011 [facility] Patient Information 2014 [place], [place].  [facility] Low Glycemic Diet (Source: [facility], 2006) Low Glycemic Foods (20-49) (Decrease risk of developing heart disease) Breakfast  Cereals: All-Bran All-Bran Fruit 'n Oats Fiber One Oatmeal (not instant) Oat bran Fruits and fruit juices: (Limit to 1-2 servings per day) Apples Apricots (fresh & dried) Blackberries Blueberries Cherries Cranberries Peaches Pears Plums Prunes Grapefruit Raspberries Strawberries Tangerine Apple juice Grapefruit juice Tomato juice Beans and legumes (fresh-cooked): Black-eyed peas Butter beans Chick peas Lentils  Green beans Lima beans Kidney beans Navy beans Pinto beans Snow peas Non-starchy vegetables: Asparagus, avocado, broccoli, cabbage, cauliflower, celery, cucumber, greens, lettuce, mushrooms, peppers, tomatoes, okra, onions, spinach, summer squash Grains: Barley Bulgur Rye Wild rice Nuts and oils : Almonds Peanuts Sunflower seeds Hazelnuts Pecans Walnuts Oils that are liquid at room temperature Dairy, fish, meat, soy, and eggs: Milk, skim Lowfat cheese Yogurt, lowfat, fruit sugar sweetened Lean red meat Fish  Skinless chicken & [place] Shellfish Egg whites (up to 3 daily) Soy products  Egg yolks (up to 7 or _____ per week) Moderate Glycemic Foods (50-69) Breakfast Cereals: Bran Buds Bran Chex Just Right Mini-Wheats  Special K Swiss muesli Fruits: Banana (under-ripe) Dates Figs Grapes Kiwi Mango Oranges Raisins Fruit Juices: Cranberry juice Orange juice Beans and legumes: Boston-type baked beans Canned pinto, kidney, or navy beans Green peas Vegetables: Beets Carrots  Sweet potato Yam Corn on the cob Breads: Pita (pocket) bread Oat bran bread Pumpernickel bread Rye bread Wheat bread, high fiber  Grains: Cornmeal Rice, brown Rice, white Couscous Pasta: Macaroni Pizza, cheese Ravioli, meat filled Spaghetti, white  Nuts: Cashews Macadamia Snacks: Chocolate Ice cream, lowfat Muffin Popcorn High Glycemic Foods (70-100)  Breakfast Cereals: Cheerios Corn Chex Corn Flakes Cream of Wheat Grape Nuts Grape Nut Flakes Grits Nutri-Grain Puffed Rice Puffed  Wheat Rice Chex Rice Krispies Shredded Wheat Team Total Fruits: Pineapple Watermelon Banana (over-ripe) Beverages: Sodas, sweet tea, pineapple juice Vegetables: Potato, baked, boiled, fried, mashed [place] fries Canned or frozen corn Parsnips Winter squash Breads: Most breads (white and whole grain) Bagels Bread sticks Bread stuffing Kaiser roll Dinner rolls Grains: Rice, instant Tapioca, with milk Candy and most cookies Snacks: Donuts Corn chips Jelly beans Pretzels Pastries

## 2013-04-18 NOTE — Assessment & Plan Note (Addendum)
His blood pressure has been elevated since he discontinued the amlodipine. We will restart amlodipine 2.5 mg a day. We'll see him back in the office in 3 months.  We will give him information on a low salt diet and low carb diet.

## 2013-04-22 ENCOUNTER — Telehealth: Payer: Self-pay | Admitting: Cardiovascular Disease

## 2013-04-22 MED ORDER — AMLODIPINE BESYLATE 5 MG PO TABS: 5.0000 mg | ORAL_TABLET | Freq: Every day | ORAL | Status: DC

## 2013-04-22 MED ORDER — METOPROLOL TARTRATE 50 MG PO TABS: 50.0000 mg | ORAL_TABLET | Freq: Two times a day (BID) | ORAL | Status: DC

## 2013-04-22 NOTE — Telephone Encounter (Signed)
Pt's bp continues to be elevated, c/o feet/leg weakness with ambulation.  meds were reviewed and he is taking BP meds correctly.  Please advise, I will return call to pt today.

## 2013-04-22 NOTE — Telephone Encounter (Signed)
New Problem  Pt is week in his feet and legs BP has been high and is not coming down  BP  8/26  180/102    6:20am     177/88   11:00am  8/27   158/93     9:30am 8/28   163/97     7:00am 8/29   177/94     7:00am

## 2013-04-22 NOTE — Telephone Encounter (Signed)
Increase amlodipine to 5 mg a day.  Continue to watch his salt.

## 2013-04-22 NOTE — Telephone Encounter (Signed)
Wife informed of med change and verbalized understanding, told to call with further questions or concerns, agreed to plan.

## 2013-04-27 ENCOUNTER — Telehealth: Payer: Self-pay | Admitting: Cardiovascular Disease

## 2013-04-27 NOTE — Telephone Encounter (Signed)
The symptoms may be due to his slow HR.  Have him hold the metoprolol for 4 days and see if he is better.

## 2013-04-27 NOTE — Telephone Encounter (Signed)
Pt can hardly walk due to legs ache when ambulates/ get better with rest, states he has no energy at all/ very fatigued. Fatigue and leg ache worse this week. Per wife, no leg or feet swelling/ no discoloration, feet are not cold- normal warmness,  Bp/p ranges 163/90 to 148/84 over past 3-4 days pulse 52-70.  Please advise, wife wonders if this can be a medication side effect.

## 2013-04-27 NOTE — Telephone Encounter (Signed)
New Prob  Pt wife states he legs are weak and he can hardly walk. She wants to knows if this could be a side effect of his medication.

## 2013-04-27 NOTE — Telephone Encounter (Signed)
Wife informed and will call back with an update Monday  05/02/13.

## 2013-05-02 ENCOUNTER — Telehealth: Payer: Self-pay | Admitting: Cardiovascular Disease

## 2013-05-02 MED ORDER — AMLODIPINE BESYLATE 5 MG PO TABS: 5.0000 mg | ORAL_TABLET | Freq: Every day | ORAL | Status: DC

## 2013-05-02 NOTE — Telephone Encounter (Signed)
Completed.

## 2013-05-02 NOTE — Telephone Encounter (Signed)
Need new refill for Amlodiphne 5.0mg  called to [facility]  [phone].  Need 90 day supply.

## 2013-05-02 NOTE — Telephone Encounter (Signed)
New Problem  Pt wants to give an update on how he is feeling from changed medication// Not feeling better// no difference.

## 2013-05-02 NOTE — Telephone Encounter (Signed)
Continues to have leg weakness, feels like he has head pressure when standing. Told wife to restart metoprolol and call pcp for assessment. She agreed to plan.

## 2013-05-03 ENCOUNTER — Other Ambulatory Visit: Payer: Self-pay | Admitting: Internal Medicine

## 2013-05-03 DIAGNOSIS — R4182 Altered mental status, unspecified: Secondary | ICD-10-CM

## 2013-05-03 DIAGNOSIS — R259 Unspecified abnormal involuntary movements: Secondary | ICD-10-CM | POA: Diagnosis not present

## 2013-05-03 DIAGNOSIS — L408 Other psoriasis: Secondary | ICD-10-CM | POA: Diagnosis not present

## 2013-05-03 DIAGNOSIS — Z6832 Body mass index (BMI) 32.0-32.9, adult: Secondary | ICD-10-CM | POA: Diagnosis not present

## 2013-05-03 DIAGNOSIS — R413 Other amnesia: Secondary | ICD-10-CM | POA: Diagnosis not present

## 2013-05-03 DIAGNOSIS — R5381 Other malaise: Secondary | ICD-10-CM | POA: Diagnosis not present

## 2013-05-03 DIAGNOSIS — Z23 Encounter for immunization: Secondary | ICD-10-CM | POA: Diagnosis not present

## 2013-05-03 DIAGNOSIS — I951 Orthostatic hypotension: Secondary | ICD-10-CM | POA: Diagnosis not present

## 2013-05-05 ENCOUNTER — Ambulatory Visit
Admission: RE | Admit: 2013-05-05 | Discharge: 2013-05-05 | Disposition: A | Discharge status: Home / Self Care | Payer: Medicare Other | Source: Ambulatory Visit | Attending: Internal Medicine | Admitting: Internal Medicine

## 2013-05-05 DIAGNOSIS — R4182 Altered mental status, unspecified: Secondary | ICD-10-CM | POA: Diagnosis not present

## 2013-05-08 DIAGNOSIS — G4700 Insomnia, unspecified: Secondary | ICD-10-CM | POA: Diagnosis not present

## 2013-05-08 DIAGNOSIS — K219 Gastro-esophageal reflux disease without esophagitis: Secondary | ICD-10-CM | POA: Diagnosis not present

## 2013-05-08 DIAGNOSIS — R0902 Hypoxemia: Secondary | ICD-10-CM | POA: Diagnosis not present

## 2013-05-08 DIAGNOSIS — I10 Essential (primary) hypertension: Secondary | ICD-10-CM | POA: Diagnosis not present

## 2013-05-08 DIAGNOSIS — I4891 Unspecified atrial fibrillation: Secondary | ICD-10-CM | POA: Diagnosis not present

## 2013-05-08 DIAGNOSIS — E538 Deficiency of other specified B group vitamins: Secondary | ICD-10-CM | POA: Diagnosis not present

## 2013-05-08 DIAGNOSIS — G4733 Obstructive sleep apnea (adult) (pediatric): Secondary | ICD-10-CM | POA: Diagnosis not present

## 2013-05-08 DIAGNOSIS — E039 Hypothyroidism, unspecified: Secondary | ICD-10-CM | POA: Diagnosis not present

## 2013-05-13 ENCOUNTER — Other Ambulatory Visit: Payer: Self-pay | Admitting: Internal Medicine

## 2013-05-13 DIAGNOSIS — R259 Unspecified abnormal involuntary movements: Secondary | ICD-10-CM

## 2013-05-13 DIAGNOSIS — R4182 Altered mental status, unspecified: Secondary | ICD-10-CM

## 2013-05-13 DIAGNOSIS — R413 Other amnesia: Secondary | ICD-10-CM

## 2013-05-16 ENCOUNTER — Other Ambulatory Visit: Payer: Self-pay

## 2013-05-16 MED ORDER — AMIODARONE HCL 200 MG PO TABS: 200.0000 mg | ORAL_TABLET | Freq: Every day | ORAL | Status: DC

## 2013-05-16 MED ORDER — METOPROLOL TARTRATE 50 MG PO TABS: 50.0000 mg | ORAL_TABLET | Freq: Two times a day (BID) | ORAL | Status: DC

## 2013-05-18 ENCOUNTER — Ambulatory Visit
Admission: RE | Admit: 2013-05-18 | Discharge: 2013-05-18 | Disposition: A | Discharge status: Home / Self Care | Payer: Medicare Other | Source: Ambulatory Visit | Attending: Internal Medicine | Admitting: Internal Medicine

## 2013-05-18 DIAGNOSIS — R4182 Altered mental status, unspecified: Secondary | ICD-10-CM | POA: Diagnosis not present

## 2013-05-18 DIAGNOSIS — R413 Other amnesia: Secondary | ICD-10-CM | POA: Diagnosis not present

## 2013-05-18 DIAGNOSIS — I658 Occlusion and stenosis of other precerebral arteries: Secondary | ICD-10-CM | POA: Diagnosis not present

## 2013-05-18 DIAGNOSIS — R259 Unspecified abnormal involuntary movements: Secondary | ICD-10-CM

## 2013-05-24 DIAGNOSIS — I10 Essential (primary) hypertension: Secondary | ICD-10-CM | POA: Diagnosis not present

## 2013-05-24 DIAGNOSIS — Z6832 Body mass index (BMI) 32.0-32.9, adult: Secondary | ICD-10-CM | POA: Diagnosis not present

## 2013-05-24 DIAGNOSIS — E538 Deficiency of other specified B group vitamins: Secondary | ICD-10-CM | POA: Diagnosis not present

## 2013-05-24 DIAGNOSIS — F411 Generalized anxiety disorder: Secondary | ICD-10-CM | POA: Diagnosis not present

## 2013-05-24 DIAGNOSIS — R413 Other amnesia: Secondary | ICD-10-CM | POA: Diagnosis not present

## 2013-06-28 ENCOUNTER — Telehealth: Payer: Self-pay | Admitting: Cardiovascular Disease

## 2013-06-28 DIAGNOSIS — E538 Deficiency of other specified B group vitamins: Secondary | ICD-10-CM | POA: Diagnosis not present

## 2013-06-28 NOTE — Telephone Encounter (Signed)
New message   Pt woke up in the middle of the night to go to the bathroom and discovered he had blood in the mask of his CPAP and in his mouth. He is on xarelto--per wife.  Since then, no coughing up blood or any other signs of bleeding.  Should pt be concerned?

## 2013-06-28 NOTE — Telephone Encounter (Signed)
No current bleeding, pt awoke with a small amount of blood in his Cpap mask. No further episode, pt feeling fine and is currently in town. Denies soreness in mouth and states no blood in nostrils.  Told pt/wife to continue to observe and call with any further episodes. Both agreed to plan.

## 2013-07-15 ENCOUNTER — Ambulatory Visit (INDEPENDENT_AMBULATORY_CARE_PROVIDER_SITE_OTHER): Payer: Medicare Other | Admitting: Cardiovascular Disease

## 2013-07-15 ENCOUNTER — Encounter: Payer: Self-pay | Admitting: Cardiovascular Disease

## 2013-07-15 VITALS — BP 146/80 | HR 65 | Ht 76.0 in | Wt 260.0 lb

## 2013-07-15 DIAGNOSIS — I4891 Unspecified atrial fibrillation: Secondary | ICD-10-CM | POA: Diagnosis not present

## 2013-07-15 DIAGNOSIS — I10 Essential (primary) hypertension: Secondary | ICD-10-CM

## 2013-07-15 LAB — LIPID PANEL
HDL: 37.6 mg/dL — ABNORMAL LOW (ref >39.00)
LDL Cholesterol: 84 mg/dL (ref 0–99)
Total CHOL/HDL Ratio: 4
Triglycerides: 79 mg/dL (ref 0.0–149.0)
VLDL: 15.8 mg/dL (ref 0.0–40.0)

## 2013-07-15 LAB — HEPATIC FUNCTION PANEL
AST: 37 U/L (ref 0–37)
Albumin: 3.4 g/dL — ABNORMAL LOW (ref 3.5–5.2)
Total Bilirubin: 0.6 mg/dL (ref 0.3–1.2)

## 2013-07-15 LAB — BASIC METABOLIC PANEL
CO2: 31 mEq/L (ref 19–32)
Chloride: 101 mEq/L (ref 96–112)
Glucose, Bld: 119 mg/dL — ABNORMAL HIGH (ref 70–99)
Potassium: 4.6 mEq/L (ref 3.5–5.1)
Sodium: 136 mEq/L (ref 135–145)

## 2013-07-15 MED ORDER — HYDROCHLOROTHIAZIDE 25 MG PO TABS: 25.0000 mg | ORAL_TABLET | Freq: Every day | ORAL | Status: DC

## 2013-07-15 MED ORDER — POTASSIUM CHLORIDE CRYS ER 10 MEQ PO TBCR: 10.0000 meq | EXTENDED_RELEASE_TABLET | Freq: Once | ORAL | Status: DC

## 2013-07-15 NOTE — Progress Notes (Signed)
Richard [name] Date of Birth  29-Oct-1930 [facility] [address]    [address] [place], [place]  [address] [phone]  Fax  [phone]  Problem List 1. LBBB 2. HTN 3. Hyperlipidemia 4. Diabetes mellitus 5. Hiatal hernia 6. Atrial fibrillation  History of Present Illness:  Richard [name] is a 79 year old old gentleman with a history of left bundle branch block. He has a history of hypertension. He's had some episodes of chest pain. He was recently seen in the emergency room with episodes of chest pain. He had multiple troponin levels which were normal. He has CT angiogram which was negative for a PE. He was not have a very large hiatal hernia.  He had a normal cardiac catheterization in 2005. He had a normal stress Myoview study in 2009.  April 18, 2013:  Richard [name] is an 79 year old gentleman with the above-noted medical problems. He was recently admitted to the hospital with atrial fibrillation. He was started on amiodarone.  He is feeling better.   His amlodipidine was held and  BP has been  elevated.    Nov. 21, 2014:  No problems.  Exercising some.  No CP , no dyspnea.    Current Outpatient Prescriptions on File Prior to Visit  Medication Sig Dispense Refill  . amiodarone (PACERONE) 200 MG tablet Take 1 tablet (200 mg total) by mouth daily.  90 tablet  4  . amLODipine (NORVASC) 5 MG tablet Take 1 tablet (5 mg total) by mouth daily.  90 tablet  3  . benazepril (LOTENSIN) 40 MG tablet Take 40 mg by mouth daily.        . Cyanocobalamin (VITAMIN B-12 IJ) Inject as directed every 30 (thirty) days.        . diazepam (VALIUM) 5 MG tablet Take 5 mg by mouth at bedtime.        . fluticasone (FLONASE) 50 MCG/ACT nasal spray Place 1 spray into the nose daily.        . metFORMIN (GLUCOPHAGE) 1000 MG tablet Take 1,000 mg by mouth 2 (two) times daily with a meal.      . metoprolol (LOPRESSOR) 50 MG tablet Take 1 tablet (50 mg total) by mouth 2 (two) times daily.  180 tablet  4  .  omeprazole (PRILOSEC) 20 MG capsule Take 20 mg by mouth 2 (two) times daily.       . pioglitazone (ACTOS) 30 MG tablet Take 30 mg by mouth daily.        . Rivaroxaban (XARELTO) 20 MG TABS tablet Take 1 tablet (20 mg total) by mouth daily with supper.  30 tablet  0  . sertraline (ZOLOFT) 50 MG tablet Take 50 mg by mouth daily.       No current facility-administered medications on file prior to visit.    Allergies  Allergen Reactions  . Codeine Other (See Comments)    Hallucinations     Past Medical History  Diagnosis Date  . Hypertension   . Anxiety   . Microcytic anemia     as well that must be iron deficiency  . Chest pain   . Hiatal hernia   . Skin cancer of lip     "lower" (04/06/2013)  . Sinus drainage     "all the time" (04/06/2013)  . OSA on CPAP   . Type II diabetes mellitus   . History of blood transfusion     "not related to OR; blood count was low" (04/06/2013)  . GERD (gastroesophageal  reflux disease)   . Daily headache     "last 2 days" (03-May-2013)  . Arthritis     "a little in my back" (May 03, 2013)  . Chronic lower back pain     "if I lift at all" (03-May-2013)  . Nephrolithiasis     "one years ago; passed on it's own" (05/03/2013)  . On home oxygen therapy     "2L w/CPAP at night" (03-May-2013)  . [place] spotted fever ~ 1945    Past Surgical History  Procedure Laterality Date  . Vasectomy      Hx of   . Lumbar disc surgery  ~ 1993  . Inguinal hernia repair Right   . Cardiac catheterization      by Dr. Elease [name], January 24, 2004, that shows minimal coronary artery irregularities and normal left ventricular function  . Cataract extraction w/ intraocular lens  implant, bilateral Bilateral   . Skin cancer excision      "lower lip" (May 03, 2013)    History  Smoking status  . Former Smoker -- 1.00 packs/day for 20 years  . Types: Cigarettes  Smokeless tobacco  . Never Used    Comment: May 03, 2013 "quit smoking years ago; didn't smoke that much; probably 20  years; 1ppd"    History  Alcohol Use No    No family history on file.  Reviw of Systems:  Reviewed in the HPI.  All other systems are negative.  Physical Exam: BP 146/80  Pulse 65  Ht 6\' 4"  (1.93 m)  Wt 260 lb (117.935 kg)  BMI 31.66 kg/m2 The patient is alert and oriented x 3.  The mood and affect are normal.   Skin: warm and dry.  Color is normal.    HEENT:   Normal carotids, no JVD  Lungs: clear   Heart: distant HS. RR, no murmur , bradycardic.   Abdomen:  obese nt. Normal BS  Extremities:  Trace edema in Right leg, minimal in left.  Neuro:  Non focal    ECG: Nov. 21, 2014:  NSR at 67, LBBB  Assessment / [name]:

## 2013-07-15 NOTE — Patient Instructions (Addendum)
   Try Meclizine for lightheadedness  Your physician has recommended you make the following change in your medication:  Start HCTZ/ hydrochlorothiazide 25 mg in the morning Kdur/ potassium chloride 10 mg with hctz  Your physician recommends that you return for lab work in: today and in 3 weeks  Your physician wants you to follow-up in: 6 months You will receive a reminder letter in the mail two months in advance. If you don't receive a letter, please call our office to schedule the follow-up appointment.   REDUCE HIGH SODIUM FOODS LIKE CANNED SOUP, GRAVY, SAUCES, READY PREPARED FOODS LIKE FROZEN FOODS; LEAN CUISINE, LASAGNA. BACON, SAUSAGE, LUNCH MEAT, FAST FOODS, HOT DOGS, CHIPS, PIZZA, CHINESE FOOD, SOY SAUCE, STORE BOUGHT FRIED CHICKEN= [place] FRIED CHICKEN/ BOJANGLES.

## 2013-07-15 NOTE — Assessment & Plan Note (Signed)
Richard [name]'s  blood pressure remains fairly well controlled. He's still eating some extra salt. I've encouraged him to stay away from eating any extra salt salty foods. We'll continue with his  same medications.

## 2013-07-15 NOTE — Assessment & Plan Note (Signed)
Remains in normal sinus rhythm. 

## 2013-07-18 NOTE — Progress Notes (Signed)
Quick Note:  Patient and his wife notified of his lab results. Verbalized understanding and agreement with current treatment plan. ______

## 2013-07-20 ENCOUNTER — Emergency Department (HOSPITAL_COMMUNITY)
Admission: EM | Admit: 2013-07-20 | Discharge: 2013-07-20 | Disposition: A | Discharge status: Home / Self Care | Payer: Medicare Other | Attending: Emergency Medicine | Admitting: Emergency Medicine

## 2013-07-20 ENCOUNTER — Encounter (HOSPITAL_COMMUNITY): Payer: Self-pay | Admitting: Emergency Medicine

## 2013-07-20 DIAGNOSIS — Z8709 Personal history of other diseases of the respiratory system: Secondary | ICD-10-CM | POA: Diagnosis not present

## 2013-07-20 DIAGNOSIS — S51809A Unspecified open wound of unspecified forearm, initial encounter: Secondary | ICD-10-CM | POA: Diagnosis not present

## 2013-07-20 DIAGNOSIS — Z862 Personal history of diseases of the blood and blood-forming organs and certain disorders involving the immune mechanism: Secondary | ICD-10-CM | POA: Insufficient documentation

## 2013-07-20 DIAGNOSIS — E119 Type 2 diabetes mellitus without complications: Secondary | ICD-10-CM | POA: Diagnosis not present

## 2013-07-20 DIAGNOSIS — W19XXXA Unspecified fall, initial encounter: Secondary | ICD-10-CM | POA: Insufficient documentation

## 2013-07-20 DIAGNOSIS — F411 Generalized anxiety disorder: Secondary | ICD-10-CM | POA: Insufficient documentation

## 2013-07-20 DIAGNOSIS — Z8619 Personal history of other infectious and parasitic diseases: Secondary | ICD-10-CM | POA: Insufficient documentation

## 2013-07-20 DIAGNOSIS — Z87442 Personal history of urinary calculi: Secondary | ICD-10-CM | POA: Diagnosis not present

## 2013-07-20 DIAGNOSIS — Z87891 Personal history of nicotine dependence: Secondary | ICD-10-CM | POA: Insufficient documentation

## 2013-07-20 DIAGNOSIS — D689 Coagulation defect, unspecified: Secondary | ICD-10-CM | POA: Insufficient documentation

## 2013-07-20 DIAGNOSIS — Z85828 Personal history of other malignant neoplasm of skin: Secondary | ICD-10-CM | POA: Diagnosis not present

## 2013-07-20 DIAGNOSIS — Z7901 Long term (current) use of anticoagulants: Secondary | ICD-10-CM | POA: Insufficient documentation

## 2013-07-20 DIAGNOSIS — IMO0002 Reserved for concepts with insufficient information to code with codable children: Secondary | ICD-10-CM | POA: Insufficient documentation

## 2013-07-20 DIAGNOSIS — Z9889 Other specified postprocedural states: Secondary | ICD-10-CM | POA: Diagnosis not present

## 2013-07-20 DIAGNOSIS — G8929 Other chronic pain: Secondary | ICD-10-CM | POA: Diagnosis not present

## 2013-07-20 DIAGNOSIS — K219 Gastro-esophageal reflux disease without esophagitis: Secondary | ICD-10-CM | POA: Diagnosis not present

## 2013-07-20 DIAGNOSIS — R58 Hemorrhage, not elsewhere classified: Secondary | ICD-10-CM | POA: Diagnosis not present

## 2013-07-20 DIAGNOSIS — Y929 Unspecified place or not applicable: Secondary | ICD-10-CM | POA: Insufficient documentation

## 2013-07-20 DIAGNOSIS — M129 Arthropathy, unspecified: Secondary | ICD-10-CM | POA: Diagnosis not present

## 2013-07-20 DIAGNOSIS — Z9981 Dependence on supplemental oxygen: Secondary | ICD-10-CM | POA: Insufficient documentation

## 2013-07-20 DIAGNOSIS — G4733 Obstructive sleep apnea (adult) (pediatric): Secondary | ICD-10-CM | POA: Insufficient documentation

## 2013-07-20 DIAGNOSIS — Z79899 Other long term (current) drug therapy: Secondary | ICD-10-CM | POA: Diagnosis not present

## 2013-07-20 DIAGNOSIS — S81009A Unspecified open wound, unspecified knee, initial encounter: Secondary | ICD-10-CM | POA: Diagnosis not present

## 2013-07-20 DIAGNOSIS — Y939 Activity, unspecified: Secondary | ICD-10-CM | POA: Insufficient documentation

## 2013-07-20 DIAGNOSIS — T148XXA Other injury of unspecified body region, initial encounter: Secondary | ICD-10-CM

## 2013-07-20 MED ORDER — TETANUS-DIPHTH-ACELL PERTUSSIS 5-2.5-18.5 LF-MCG/0.5 IM SUSP: 0.5000 mL | Freq: Once | INTRAMUSCULAR | Status: DC

## 2013-07-20 NOTE — ED Notes (Signed)
Patient given discharge instruction, verbalized understand. Patient ambulatory out of the department.  

## 2013-07-20 NOTE — ED Provider Notes (Signed)
CSN: [id number]     Arrival date & time 07/20/13  0158 History   First MD Initiated Contact with Patient 07/20/13 0241     Chief Complaint  Patient presents with  . skin tear/ bleeding    (Consider location/radiation/quality/duration/timing/severity/associated sxs/prior Treatment) HPI History provided by patient. Fell 2 days ago and sustained a skin tear to right proximal forearm and to right knee. He takes Xarelto as prescribed. He placed bandages over these wounds and had been doing well without any significant pain. No difficulty walking or moving extremities. At home tonight developed bleeding through his bandages, became concerned and presents here. Bleeding controlled at this time. He has some bruising. He declines any need for x-rays. No new medications. No recent illness. No other complaints. Past Medical History  Diagnosis Date  . Hypertension   . Anxiety   . Microcytic anemia     as well that must be iron deficiency  . Chest pain   . Hiatal hernia   . Skin cancer of lip     "lower" (Apr 08, 2013)  . Sinus drainage     "all the time" (2013/04/08)  . OSA on CPAP   . Type II diabetes mellitus   . History of blood transfusion     "not related to OR; blood count was low" (April 08, 2013)  . GERD (gastroesophageal reflux disease)   . Daily headache     "last 2 days" (04-08-2013)  . Arthritis     "a little in my back" (2013/04/08)  . Chronic lower back pain     "if I lift at all" (04-08-2013)  . Nephrolithiasis     "one years ago; passed on it's own" (08-Apr-2013)  . On home oxygen therapy     "2L w/CPAP at night" (2013/04/08)  . [place] spotted fever ~ 1945   Past Surgical History  Procedure Laterality Date  . Vasectomy      Hx of   . Lumbar disc surgery  ~ 1993  . Inguinal hernia repair Right   . Cardiac catheterization      by Dr. Elease Hashimoto, January 24, 2004, that shows minimal coronary artery irregularities and normal left ventricular function  . Cataract extraction w/  intraocular lens  implant, bilateral Bilateral   . Skin cancer excision      "lower lip" (04-08-2013)   No family history on file. History  Substance Use Topics  . Smoking status: Former Smoker -- 0.00 packs/day for 20 years    Types: Cigarettes  . Smokeless tobacco: Never Used     Comment: 04/08/13 "quit smoking years ago; didn't smoke that much; probably 20 years; 1ppd"  . Alcohol Use: No    Review of Systems  Constitutional: Negative for fever and chills.  Respiratory: Negative for shortness of breath.   Cardiovascular: Negative for chest pain.  Gastrointestinal: Negative for vomiting and abdominal pain.  Genitourinary: Negative for hematuria.  Musculoskeletal: Negative for back pain, neck pain and neck stiffness.  Skin: Positive for wound.  Neurological: Negative for weakness and numbness.  All other systems reviewed and are negative.    Allergies  Codeine  Home Medications   Current Outpatient Rx  Name  Route  Sig  Dispense  Refill  . amiodarone (PACERONE) 200 MG tablet   Oral   Take 1 tablet (200 mg total) by mouth daily.   90 tablet   4     DECREASED DOSE   . amLODipine (NORVASC) 5 MG tablet   Oral   Take  1 tablet (5 mg total) by mouth daily.   90 tablet   3     increased   . hydrochlorothiazide (HYDRODIURIL) 25 MG tablet   Oral   Take 1 tablet (25 mg total) by mouth daily.   30 tablet   6   . metoprolol (LOPRESSOR) 50 MG tablet   Oral   Take 1 tablet (50 mg total) by mouth 2 (two) times daily.   180 tablet   4   . potassium chloride (K-DUR,KLOR-CON) 10 MEQ tablet   Oral   Take 1 tablet (10 mEq total) by mouth once.   30 tablet   6   . Rivaroxaban (XARELTO) 20 MG TABS tablet   Oral   Take 1 tablet (20 mg total) by mouth daily with supper.   30 tablet   0   . benazepril (LOTENSIN) 40 MG tablet   Oral   Take 40 mg by mouth daily.           . Cyanocobalamin (VITAMIN B-12 IJ)   Injection   Inject as directed every 30 (thirty) days.            . diazepam (VALIUM) 5 MG tablet   Oral   Take 5 mg by mouth at bedtime.           . fluticasone (FLONASE) 50 MCG/ACT nasal spray   Nasal   Place 1 spray into the nose daily.           . metFORMIN (GLUCOPHAGE) 1000 MG tablet   Oral   Take 1,000 mg by mouth 2 (two) times daily with a meal.         . omeprazole (PRILOSEC) 20 MG capsule   Oral   Take 20 mg by mouth 2 (two) times daily.          . pioglitazone (ACTOS) 30 MG tablet   Oral   Take 30 mg by mouth daily.           . sertraline (ZOLOFT) 50 MG tablet   Oral   Take 50 mg by mouth daily.          BP 153/77  Pulse 58  Temp(Src) 97.7 F (36.5 C) (Oral)  Resp 18  Ht 6\' 3"  (1.905 m)  Wt 260 lb (117.935 kg)  BMI 32.50 kg/m2  SpO2 96% Physical Exam  Constitutional: He is oriented to person, place, and time. He appears well-developed and well-nourished.  HENT:  Head: Normocephalic and atraumatic.  Eyes: EOM are normal. Pupils are equal, round, and reactive to light.  Neck: Neck supple.  Cardiovascular: Regular rhythm and intact distal pulses.   Pulmonary/Chest: Effort normal. No respiratory distress.  Musculoskeletal: Normal range of motion.  Right upper extremity with skin tears over the proximal forearm and elbow without active bleeding. No deep lacerations. No bony tenderness with full range of motion and distal neurovascular intact. Right knee with superficial abrasions and no active bleeding. There is a small effusion good range of motion and again distal neurovascular intact.  Neurological: He is alert and oriented to person, place, and time.  Skin: Skin is warm and dry.    ED Course  Procedures (including critical care time)  Wounds irrigated and cleaned. Steri-Strips placed. Nonadhesive bulky dressings with Kerlix provided. Bandages to use at home provided. Precautions provided and verbalized as understood. Plan continue medications and followup primary care physician as needed.   MDM   Diagnoses: Skin tears with recurrent bleeding due to Xarelto  Wound care provided Vital Signs / nursing notes reviewed and considered    Sunnie Nielsen, MD 07/20/13 [phone]

## 2013-07-20 NOTE — ED Notes (Signed)
Applied steri strips, Vaseline gauze and bulky dressing to right arm.  To right knee, telfa dressing and tape

## 2013-07-20 NOTE — ED Notes (Signed)
Pt fell Sunday. Skin tear to right arm and abrasion to right knee. Tonight pt woke up to bleeding. Pt is on blood thinner for Afib.

## 2013-07-20 NOTE — ED Notes (Signed)
Pt has had tetanus in last 2 years.

## 2013-07-26 DIAGNOSIS — Z6832 Body mass index (BMI) 32.0-32.9, adult: Secondary | ICD-10-CM | POA: Diagnosis not present

## 2013-07-26 DIAGNOSIS — I10 Essential (primary) hypertension: Secondary | ICD-10-CM | POA: Diagnosis not present

## 2013-07-26 DIAGNOSIS — E119 Type 2 diabetes mellitus without complications: Secondary | ICD-10-CM | POA: Diagnosis not present

## 2013-07-26 DIAGNOSIS — T148XXA Other injury of unspecified body region, initial encounter: Secondary | ICD-10-CM | POA: Diagnosis not present

## 2013-07-26 DIAGNOSIS — Z23 Encounter for immunization: Secondary | ICD-10-CM | POA: Diagnosis not present

## 2013-07-26 DIAGNOSIS — F411 Generalized anxiety disorder: Secondary | ICD-10-CM | POA: Diagnosis not present

## 2013-08-03 DIAGNOSIS — E538 Deficiency of other specified B group vitamins: Secondary | ICD-10-CM | POA: Diagnosis not present

## 2013-08-05 ENCOUNTER — Other Ambulatory Visit (INDEPENDENT_AMBULATORY_CARE_PROVIDER_SITE_OTHER): Payer: Medicare Other

## 2013-08-05 DIAGNOSIS — I10 Essential (primary) hypertension: Secondary | ICD-10-CM | POA: Diagnosis not present

## 2013-08-05 DIAGNOSIS — I4891 Unspecified atrial fibrillation: Secondary | ICD-10-CM

## 2013-08-05 LAB — BASIC METABOLIC PANEL
CO2: 27 mEq/L (ref 19–32)
Calcium: 8.7 mg/dL (ref 8.4–10.5)
Glucose, Bld: 105 mg/dL — ABNORMAL HIGH (ref 70–99)
Potassium: 4.5 mEq/L (ref 3.5–5.1)
Sodium: 135 mEq/L (ref 135–145)

## 2013-08-08 ENCOUNTER — Telehealth: Payer: Self-pay | Admitting: *Deleted

## 2013-08-08 NOTE — Telephone Encounter (Signed)
Last office visit pt was started on HCTZ 25 mg K+ 10 meq daily. Since new meds bp / p ranges mainly 154/77 p 70's. This is an average, 154/77- 180/79 Please advise if you would like to adjust his meds.

## 2013-08-09 NOTE — Telephone Encounter (Signed)
Per Audrie Lia Pharm-D for coumadin clinic, pt may hold Xarelto for 24 hours for tooth extraction. Pt notified/ will fax to dentist office so he can be scheduled.

## 2013-08-09 NOTE — Telephone Encounter (Signed)
New Message  Pt wife called states that this pt will need his tooth pulled. Is he required to come off of Xarelto? If so how long? Please send clearance to Dr. Tenny Craw office.. No fax provided. Office number is [phone]. Please assist

## 2013-08-09 NOTE — Telephone Encounter (Signed)
Spoke with Dr Charlott Rakes staff/ how long can pt be off Xarelto for a tooth extraction? [id number]. No appointment scheduled until has an answer. Please advise. Fax: [phone]

## 2013-09-06 DIAGNOSIS — E538 Deficiency of other specified B group vitamins: Secondary | ICD-10-CM | POA: Diagnosis not present

## 2013-09-23 DIAGNOSIS — E119 Type 2 diabetes mellitus without complications: Secondary | ICD-10-CM | POA: Diagnosis not present

## 2013-09-23 DIAGNOSIS — Z0000 Encounter for general adult medical examination without abnormal findings: Secondary | ICD-10-CM | POA: Diagnosis not present

## 2013-09-23 DIAGNOSIS — Z125 Encounter for screening for malignant neoplasm of prostate: Secondary | ICD-10-CM | POA: Diagnosis not present

## 2013-09-23 DIAGNOSIS — D539 Nutritional anemia, unspecified: Secondary | ICD-10-CM | POA: Diagnosis not present

## 2013-09-23 DIAGNOSIS — E039 Hypothyroidism, unspecified: Secondary | ICD-10-CM | POA: Diagnosis not present

## 2013-09-26 DIAGNOSIS — D649 Anemia, unspecified: Secondary | ICD-10-CM | POA: Diagnosis not present

## 2013-09-26 DIAGNOSIS — E538 Deficiency of other specified B group vitamins: Secondary | ICD-10-CM | POA: Diagnosis not present

## 2013-09-27 DIAGNOSIS — Z1212 Encounter for screening for malignant neoplasm of rectum: Secondary | ICD-10-CM | POA: Diagnosis not present

## 2013-09-27 LAB — IFOBT (OCCULT BLOOD): IFOBT: NEGATIVE

## 2013-09-30 ENCOUNTER — Encounter: Payer: Self-pay | Admitting: Cardiovascular Disease

## 2013-09-30 DIAGNOSIS — L408 Other psoriasis: Secondary | ICD-10-CM | POA: Diagnosis not present

## 2013-09-30 DIAGNOSIS — K219 Gastro-esophageal reflux disease without esophagitis: Secondary | ICD-10-CM | POA: Diagnosis not present

## 2013-09-30 DIAGNOSIS — R413 Other amnesia: Secondary | ICD-10-CM | POA: Diagnosis not present

## 2013-09-30 DIAGNOSIS — I872 Venous insufficiency (chronic) (peripheral): Secondary | ICD-10-CM | POA: Diagnosis not present

## 2013-09-30 DIAGNOSIS — Z0000 Encounter for general adult medical examination without abnormal findings: Secondary | ICD-10-CM | POA: Diagnosis not present

## 2013-09-30 DIAGNOSIS — E538 Deficiency of other specified B group vitamins: Secondary | ICD-10-CM | POA: Diagnosis not present

## 2013-09-30 DIAGNOSIS — I10 Essential (primary) hypertension: Secondary | ICD-10-CM | POA: Diagnosis not present

## 2013-09-30 DIAGNOSIS — D649 Anemia, unspecified: Secondary | ICD-10-CM | POA: Diagnosis not present

## 2013-10-22 DIAGNOSIS — M5137 Other intervertebral disc degeneration, lumbosacral region: Secondary | ICD-10-CM | POA: Diagnosis not present

## 2013-10-24 ENCOUNTER — Telehealth: Payer: Self-pay | Admitting: Cardiovascular Disease

## 2013-10-24 NOTE — Telephone Encounter (Signed)
PER Dr Acie Fredrickson, ok to hold Xarelto for 2 days prior to epidural.  Fax to [phone]

## 2013-10-24 NOTE — Telephone Encounter (Signed)
Pt informed

## 2013-10-24 NOTE — Telephone Encounter (Signed)
New message    Wife calling - need to come xarelto for upcoming epidural injection.   Dr.Ramous office # [phone].- [facility]

## 2013-10-25 DIAGNOSIS — E538 Deficiency of other specified B group vitamins: Secondary | ICD-10-CM | POA: Diagnosis not present

## 2013-11-09 DIAGNOSIS — M545 Low back pain, unspecified: Secondary | ICD-10-CM | POA: Diagnosis not present

## 2013-11-22 DIAGNOSIS — H16209 Unspecified keratoconjunctivitis, unspecified eye: Secondary | ICD-10-CM | POA: Diagnosis not present

## 2013-11-24 DIAGNOSIS — E538 Deficiency of other specified B group vitamins: Secondary | ICD-10-CM | POA: Diagnosis not present

## 2013-11-25 DIAGNOSIS — H01009 Unspecified blepharitis unspecified eye, unspecified eyelid: Secondary | ICD-10-CM | POA: Diagnosis not present

## 2013-12-05 DIAGNOSIS — M5137 Other intervertebral disc degeneration, lumbosacral region: Secondary | ICD-10-CM | POA: Diagnosis not present

## 2013-12-05 DIAGNOSIS — M545 Low back pain, unspecified: Secondary | ICD-10-CM | POA: Diagnosis not present

## 2013-12-09 DIAGNOSIS — H10509 Unspecified blepharoconjunctivitis, unspecified eye: Secondary | ICD-10-CM | POA: Diagnosis not present

## 2013-12-12 ENCOUNTER — Encounter: Payer: Self-pay | Admitting: Cardiovascular Disease

## 2013-12-12 DIAGNOSIS — D649 Anemia, unspecified: Secondary | ICD-10-CM | POA: Diagnosis not present

## 2013-12-12 DIAGNOSIS — G4733 Obstructive sleep apnea (adult) (pediatric): Secondary | ICD-10-CM | POA: Diagnosis not present

## 2013-12-12 DIAGNOSIS — R42 Dizziness and giddiness: Secondary | ICD-10-CM | POA: Diagnosis not present

## 2013-12-12 DIAGNOSIS — I4891 Unspecified atrial fibrillation: Secondary | ICD-10-CM | POA: Diagnosis not present

## 2013-12-12 DIAGNOSIS — I951 Orthostatic hypotension: Secondary | ICD-10-CM | POA: Diagnosis not present

## 2013-12-12 DIAGNOSIS — I10 Essential (primary) hypertension: Secondary | ICD-10-CM | POA: Diagnosis not present

## 2013-12-12 DIAGNOSIS — Z6832 Body mass index (BMI) 32.0-32.9, adult: Secondary | ICD-10-CM | POA: Diagnosis not present

## 2013-12-27 DIAGNOSIS — E538 Deficiency of other specified B group vitamins: Secondary | ICD-10-CM | POA: Diagnosis not present

## 2014-01-11 ENCOUNTER — Encounter: Payer: Self-pay | Admitting: Cardiovascular Disease

## 2014-01-11 ENCOUNTER — Ambulatory Visit (INDEPENDENT_AMBULATORY_CARE_PROVIDER_SITE_OTHER): Payer: Medicare Other | Admitting: Cardiovascular Disease

## 2014-01-11 VITALS — BP 130/70 | HR 60 | Ht 75.0 in | Wt 264.4 lb

## 2014-01-11 DIAGNOSIS — I4891 Unspecified atrial fibrillation: Secondary | ICD-10-CM | POA: Diagnosis not present

## 2014-01-11 DIAGNOSIS — I10 Essential (primary) hypertension: Secondary | ICD-10-CM | POA: Diagnosis not present

## 2014-01-11 DIAGNOSIS — R5383 Other fatigue: Secondary | ICD-10-CM | POA: Diagnosis not present

## 2014-01-11 DIAGNOSIS — R5381 Other malaise: Secondary | ICD-10-CM | POA: Diagnosis not present

## 2014-01-11 LAB — TSH: TSH: 3.2 u[IU]/mL (ref 0.35–4.50)

## 2014-01-11 MED ORDER — AMIODARONE HCL 200 MG PO TABS: ORAL_TABLET | ORAL | Status: DC

## 2014-01-11 NOTE — Assessment & Plan Note (Signed)
Is maintaining normal sinus rhythm. He's having lots of fatigue. It is possible that this may be due to the amiodarone.  We will check a TSH .  Will decrease his amiodarone to 200 mg 3 times a week.

## 2014-01-11 NOTE — Assessment & Plan Note (Signed)
BP is well controlled 

## 2014-01-11 NOTE — Progress Notes (Signed)
Richard [name] Date of Birth  February 22, 1931 [facility] [address]    [place], [place]  [address] [phone]  Fax  [phone]  Problem List 1. LBBB 2. HTN 3. Hyperlipidemia 4. Diabetes mellitus 5. Hiatal hernia 6. Atrial fibrillation  History of Present Illness:  Richard [name] is a 80 year old old gentleman with a history of left bundle branch block. He has a history of hypertension. He's had some episodes of chest pain. He was recently seen in the emergency room with episodes of chest pain. He had multiple troponin levels which were normal. He has CT angiogram which was negative for a PE. He was not have a very large hiatal hernia.  He had a normal cardiac catheterization in 2005. He had a normal stress Myoview study in 2009.  April 18, 2013:  Richard [name] is an 80 year old gentleman with the above-noted medical problems. He was recently admitted to the hospital with atrial fibrillation. He was started on amiodarone.  He is feeling better.   His amlodipidine was held and  BP has been  elevated.    Nov. 21, 2014:  No problems.  Exercising some.  No CP , no dyspnea.    Jan 11, 2014: Richard [name] complains of profound weakness.  Also complains of orthostatic hypotension.   He's not noticed any blood in his stool.   Current Outpatient Prescriptions on File Prior to Visit  Medication Sig Dispense Refill  . amiodarone (PACERONE) 200 MG tablet Take 1 tablet (200 mg total) by mouth daily.  90 tablet  4  . amLODipine (NORVASC) 5 MG tablet Take 1 tablet (5 mg total) by mouth daily.  90 tablet  3  . benazepril (LOTENSIN) 40 MG tablet Take 40 mg by mouth daily.        . Cyanocobalamin (VITAMIN B-12 IJ) Inject as directed every 30 (thirty) days.        . diazepam (VALIUM) 5 MG tablet Take 5 mg by mouth at bedtime.        . fluticasone (FLONASE) 50 MCG/ACT nasal spray Place 1 spray into the nose daily.        . hydrochlorothiazide (HYDRODIURIL) 25 MG tablet Take 1 tablet (25 mg total)  by mouth daily.  30 tablet  6  . metFORMIN (GLUCOPHAGE) 1000 MG tablet Take 1,000 mg by mouth 2 (two) times daily with a meal.      . metoprolol (LOPRESSOR) 50 MG tablet Take 1 tablet (50 mg total) by mouth 2 (two) times daily.  180 tablet  4  . omeprazole (PRILOSEC) 20 MG capsule Take 20 mg by mouth 2 (two) times daily.       . pioglitazone (ACTOS) 30 MG tablet Take 30 mg by mouth daily.        . potassium chloride (K-DUR,KLOR-CON) 10 MEQ tablet Take 1 tablet (10 mEq total) by mouth once.  30 tablet  6  . Rivaroxaban (XARELTO) 20 MG TABS tablet Take 1 tablet (20 mg total) by mouth daily with supper.  30 tablet  0  . sertraline (ZOLOFT) 50 MG tablet Take 50 mg by mouth daily.       No current facility-administered medications on file prior to visit.    Allergies  Allergen Reactions  . Codeine Other (See Comments)    Hallucinations     Past Medical History  Diagnosis Date  . Hypertension   . Anxiety   . Microcytic anemia     as well that must be iron deficiency  .  Chest pain   . Hiatal hernia   . Skin cancer of lip     "lower" (04/21/2013)  . Sinus drainage     "all the time" (21-Apr-2013)  . OSA on CPAP   . Type II diabetes mellitus   . History of blood transfusion     "not related to OR; blood count was low" (04/21/13)  . GERD (gastroesophageal reflux disease)   . Daily headache     "last 2 days" (04-21-13)  . Arthritis     "a little in my back" (04-21-2013)  . Chronic lower back pain     "if I lift at all" (2013/04/21)  . Nephrolithiasis     "one years ago; passed on it's own" (21-Apr-2013)  . On home oxygen therapy     "2L w/CPAP at night" (Apr 21, 2013)  . Rocky Mountain spotted fever ~ 1945    Past Surgical History  Procedure Laterality Date  . Vasectomy      Hx of   . Lumbar disc surgery  ~ 1993  . Inguinal hernia repair Right   . Cardiac catheterization      by Dr. Acie Fredrickson, January 24, 2004, that shows minimal coronary artery irregularities and normal left  ventricular function  . Cataract extraction w/ intraocular lens  implant, bilateral Bilateral   . Skin cancer excision      "lower lip" (2013/04/21)    History  Smoking status  . Former Smoker -- 0.00 packs/day for 20 years  . Types: Cigarettes  Smokeless tobacco  . Never Used    Comment: 2013-04-21 "quit smoking years ago; didn't smoke that much; probably 20 years; 1ppd"    History  Alcohol Use No    No family history on file.  Reviw of Systems:  Reviewed in the HPI.  All other systems are negative.  Physical Exam: BP 130/70  Pulse 60  Ht 6\' 3"  (1.905 m)  Wt 264 lb 6.4 oz (119.931 kg)  BMI 33.05 kg/m2 The patient is alert and oriented x 3.  The mood and affect are normal.   Skin: warm and dry.  Color is normal.    HEENT:   Normal carotids, no JVD  Lungs: clear   Heart: distant HS. RR, no murmur , bradycardic.   Abdomen:  obese nt. Normal BS  Extremities:  Trace edema in Right leg, minimal in left.  Neuro:  Non focal    ECG: Nov. 21, 2014:  NSR at 67, LBBB  Assessment / Plan:

## 2014-01-11 NOTE — Patient Instructions (Signed)
Your physician recommends that you have lab work today:  thyroid  Your physician has recommended you make the following change in your medication:  TAKE Amiodarone 200 mg Monday, Wednesday, Friday only  Your physician wants you to follow-up in: 6 months with Dr. Acie Fredrickson.  You will receive a reminder letter in the mail two months in advance. If you don't receive a letter, please call our office to schedule the follow-up appointment.

## 2014-01-18 ENCOUNTER — Telehealth: Payer: Self-pay | Admitting: Cardiovascular Disease

## 2014-01-18 NOTE — Telephone Encounter (Signed)
Returned call to patient's wife spoke to Dr.Nahser he advised to hold Xarelto 2 days prior to dental extractions.Dr.Nahser signed release for treatment form from [place] office.Faxed back to fax # [phone].

## 2014-01-18 NOTE — Telephone Encounter (Signed)
Spoke with pt wife, pt is going to have 12 teeth pulled and dental plate made. Note will need to be faxed to [phone]. Dr Zenda Alpers

## 2014-01-18 NOTE — Telephone Encounter (Signed)
Patient needs note that it is okay to come off [place] for dental work. Please call when note is ready and wife will pick up. Please call and advise.

## 2014-01-31 DIAGNOSIS — E538 Deficiency of other specified B group vitamins: Secondary | ICD-10-CM | POA: Diagnosis not present

## 2014-02-17 ENCOUNTER — Telehealth: Payer: Self-pay | Admitting: Cardiovascular Disease

## 2014-02-17 NOTE — Telephone Encounter (Signed)
Patient may be held.   Typically we hold for 1-2 days prior to procedure. Given the fact that creatinine is 1.5., it will be OK to hold for 3 days prior to procedure.

## 2014-02-17 NOTE — Telephone Encounter (Signed)
New Prob    Requesting pt come off of Xarelto 5 days prior to a lumbar epidermal steroid injection 7/15. Please call,

## 2014-02-17 NOTE — Telephone Encounter (Signed)
Estill Bamberg is advised that Dr Acie Fredrickson and his nurse, Sharyn Lull, are out of the office today and that I am forwarding this message to them to address when they return.

## 2014-02-17 NOTE — Telephone Encounter (Signed)
Richard [name] is advised, per Dr Acie Fredrickson, that the pt may hold Plavix 3 days prior to procedure. She verbalized understanding and asked me to fax her this message from Dr Acie Fredrickson. I faxed this message to [place] at [phone] and received a conformation that the fax went successfully.

## 2014-03-02 DIAGNOSIS — D649 Anemia, unspecified: Secondary | ICD-10-CM | POA: Diagnosis not present

## 2014-03-08 DIAGNOSIS — IMO0002 Reserved for concepts with insufficient information to code with codable children: Secondary | ICD-10-CM | POA: Diagnosis not present

## 2014-03-30 ENCOUNTER — Encounter (HOSPITAL_COMMUNITY): Payer: Self-pay | Admitting: Emergency Medicine

## 2014-03-30 ENCOUNTER — Emergency Department (HOSPITAL_COMMUNITY): Payer: Medicare Other

## 2014-03-30 ENCOUNTER — Emergency Department (HOSPITAL_COMMUNITY)
Admission: EM | Admit: 2014-03-30 | Discharge: 2014-03-30 | Disposition: A | Discharge status: Home / Self Care | Payer: Medicare Other | Attending: Emergency Medicine | Admitting: Emergency Medicine

## 2014-03-30 DIAGNOSIS — K219 Gastro-esophageal reflux disease without esophagitis: Secondary | ICD-10-CM | POA: Diagnosis not present

## 2014-03-30 DIAGNOSIS — Z8619 Personal history of other infectious and parasitic diseases: Secondary | ICD-10-CM | POA: Insufficient documentation

## 2014-03-30 DIAGNOSIS — Z7901 Long term (current) use of anticoagulants: Secondary | ICD-10-CM | POA: Insufficient documentation

## 2014-03-30 DIAGNOSIS — Z87891 Personal history of nicotine dependence: Secondary | ICD-10-CM | POA: Diagnosis not present

## 2014-03-30 DIAGNOSIS — R5381 Other malaise: Secondary | ICD-10-CM | POA: Diagnosis not present

## 2014-03-30 DIAGNOSIS — IMO0002 Reserved for concepts with insufficient information to code with codable children: Secondary | ICD-10-CM | POA: Diagnosis not present

## 2014-03-30 DIAGNOSIS — Z87442 Personal history of urinary calculi: Secondary | ICD-10-CM | POA: Diagnosis not present

## 2014-03-30 DIAGNOSIS — G4733 Obstructive sleep apnea (adult) (pediatric): Secondary | ICD-10-CM | POA: Diagnosis not present

## 2014-03-30 DIAGNOSIS — Z9889 Other specified postprocedural states: Secondary | ICD-10-CM | POA: Insufficient documentation

## 2014-03-30 DIAGNOSIS — F411 Generalized anxiety disorder: Secondary | ICD-10-CM | POA: Diagnosis not present

## 2014-03-30 DIAGNOSIS — D508 Other iron deficiency anemias: Secondary | ICD-10-CM | POA: Insufficient documentation

## 2014-03-30 DIAGNOSIS — E119 Type 2 diabetes mellitus without complications: Secondary | ICD-10-CM | POA: Diagnosis not present

## 2014-03-30 DIAGNOSIS — Z9981 Dependence on supplemental oxygen: Secondary | ICD-10-CM | POA: Diagnosis not present

## 2014-03-30 DIAGNOSIS — Z79899 Other long term (current) drug therapy: Secondary | ICD-10-CM | POA: Insufficient documentation

## 2014-03-30 DIAGNOSIS — Z85828 Personal history of other malignant neoplasm of skin: Secondary | ICD-10-CM | POA: Diagnosis not present

## 2014-03-30 DIAGNOSIS — D6489 Other specified anemias: Secondary | ICD-10-CM | POA: Diagnosis not present

## 2014-03-30 DIAGNOSIS — Z8739 Personal history of other diseases of the musculoskeletal system and connective tissue: Secondary | ICD-10-CM | POA: Insufficient documentation

## 2014-03-30 DIAGNOSIS — I10 Essential (primary) hypertension: Secondary | ICD-10-CM | POA: Insufficient documentation

## 2014-03-30 DIAGNOSIS — G8929 Other chronic pain: Secondary | ICD-10-CM | POA: Diagnosis not present

## 2014-03-30 DIAGNOSIS — Z8709 Personal history of other diseases of the respiratory system: Secondary | ICD-10-CM | POA: Insufficient documentation

## 2014-03-30 DIAGNOSIS — R5383 Other fatigue: Secondary | ICD-10-CM | POA: Diagnosis not present

## 2014-03-30 DIAGNOSIS — R531 Weakness: Secondary | ICD-10-CM

## 2014-03-30 LAB — I-STAT CHEM 8, ED
BUN: 19 mg/dL (ref 6–23)
CALCIUM ION: 1.21 mmol/L (ref 1.13–1.30)
Chloride: 102 mEq/L (ref 96–112)
Creatinine, Ser: 1.4 mg/dL — ABNORMAL HIGH (ref 0.50–1.35)
GLUCOSE: 86 mg/dL (ref 70–99)
HEMATOCRIT: 29 % — AB (ref 39.0–52.0)
HEMOGLOBIN: 9.9 g/dL — AB (ref 13.0–17.0)
Potassium: 3.7 mEq/L (ref 3.7–5.3)
Sodium: 140 mEq/L (ref 137–147)
TCO2: 22 mmol/L (ref 0–100)

## 2014-03-30 LAB — BASIC METABOLIC PANEL
Anion gap: 15 (ref 5–15)
BUN: 20 mg/dL (ref 6–23)
CALCIUM: 8.7 mg/dL (ref 8.4–10.5)
CO2: 23 mEq/L (ref 19–32)
CREATININE: 1.32 mg/dL (ref 0.50–1.35)
Chloride: 106 mEq/L (ref 96–112)
GFR calc Af Amer: 57 mL/min — ABNORMAL LOW (ref >90)
GFR calc non Af Amer: 49 mL/min — ABNORMAL LOW (ref >90)
GLUCOSE: 85 mg/dL (ref 70–99)
Potassium: 4 mEq/L (ref 3.7–5.3)
Sodium: 144 mEq/L (ref 137–147)

## 2014-03-30 LAB — CBC
HCT: 28.1 % — ABNORMAL LOW (ref 39.0–52.0)
HEMOGLOBIN: 8.5 g/dL — AB (ref 13.0–17.0)
MCH: 27.2 pg (ref 26.0–34.0)
MCHC: 30.2 g/dL (ref 30.0–36.0)
MCV: 89.8 fL (ref 78.0–100.0)
Platelets: 169 10*3/uL (ref 150–400)
RBC: 3.13 MIL/uL — ABNORMAL LOW (ref 4.22–5.81)
RDW: 17.6 % — ABNORMAL HIGH (ref 11.5–15.5)
WBC: 4.7 10*3/uL (ref 4.0–10.5)

## 2014-03-30 LAB — URINALYSIS, ROUTINE W REFLEX MICROSCOPIC
BILIRUBIN URINE: NEGATIVE
Glucose, UA: NEGATIVE mg/dL
Hgb urine dipstick: NEGATIVE
Ketones, ur: NEGATIVE mg/dL
LEUKOCYTES UA: NEGATIVE
NITRITE: NEGATIVE
Protein, ur: NEGATIVE mg/dL
Specific Gravity, Urine: 1.019 (ref 1.005–1.030)
UROBILINOGEN UA: 0.2 mg/dL (ref 0.0–1.0)
pH: 5 (ref 5.0–8.0)

## 2014-03-30 LAB — TYPE AND SCREEN
ABO/RH(D): O POS
ANTIBODY SCREEN: NEGATIVE

## 2014-03-30 LAB — POC OCCULT BLOOD, ED: FECAL OCCULT BLD: NEGATIVE

## 2014-03-30 LAB — PROTIME-INR
INR: 1.25 (ref 0.00–1.49)
Prothrombin Time: 15.7 seconds — ABNORMAL HIGH (ref 11.6–15.2)

## 2014-03-30 LAB — I-STAT TROPONIN, ED: TROPONIN I, POC: 0 ng/mL (ref 0.00–0.08)

## 2014-03-30 LAB — PRO B NATRIURETIC PEPTIDE: Pro B Natriuretic peptide (BNP): 841.9 pg/mL — ABNORMAL HIGH (ref 0–450)

## 2014-03-30 LAB — ABO/RH: ABO/RH(D): O POS

## 2014-03-30 MED ORDER — FERROUS SULFATE 325 (65 FE) MG PO TABS: 325.0000 mg | ORAL_TABLET | Freq: Every day | ORAL | Status: DC

## 2014-03-30 NOTE — ED Notes (Signed)
Pt arrives via POV from home with generalized weakness progressing over the last month. Pt reports several fall in the last month around the home. Denies chest pain. Pt reports fatigue and SOB when trying to walk to the mailbox and back. Pt awake, alert, oriented x4, no neuro deficts noted. Denies fever/diarrhea. Recent illness.

## 2014-03-30 NOTE — Progress Notes (Signed)
ED CM consulted by Dr. Darl Householder  Concerning possible [place] needs. Patient presented to [facility] ED with complaints  Generalized weakness over the last month. Mainly weak when he walks. He had several falls, most recently a week ago. Went to [employer] yesterday and felt weaker. Reviewed record,Payor source is  Medicare.  Met with patient and wife at bedside, confirmed information. Discussed recommendation for [facility] services, and explained the services recommended. Patient and wife declined [place] services.Patient lives at home with wife.  Patient stated, that he will be seeing his PCP on Friday and prefers he orders therapy.  ED CM discussed with Dr. Darl Householder. Patient will be discharged home with prescription to follow up with PCP.  Spoke with patient and wife for any further questions of concerns which they denied.  No further ED CM needs identified.

## 2014-03-30 NOTE — Discharge Instructions (Signed)
Take iron pills daily.   Continue your current meds.   Follow up with your doctor.   Return to ER if you have blood in stool, black stools, passing out, worse weakness, shortness of breath.

## 2014-03-30 NOTE — ED Notes (Signed)
Patient was ambulaing with Tech around the nurses station and back to room 38 of Pod E. He stated he felt tired after walking 67ft from the room

## 2014-03-30 NOTE — ED Provider Notes (Addendum)
CSN: [id number]     Arrival date & time 03/30/14  1059 History   First MD Initiated Contact with Patient 03/30/14 1112     Chief Complaint  Patient presents with  . Fatigue     (Consider location/radiation/quality/duration/timing/severity/associated sxs/prior Treatment) The history is provided by the patient and the spouse.  Richard PLACERES is a 80 y.o. male hx of HTN, anemia, DM, chronic low back pain here with weakness. Generalized weakness over the last month. Mainly weak when he walks. He had several falls, most recently a week ago. Went to [employer] yesterday and felt weaker. Denies shortness of breath with exertion to me. No chest pain. No vomiting or diarrhea or fever. Patient on xarelto for afib. Denies melena or vomiting blood.    Past Medical History  Diagnosis Date  . Hypertension   . Anxiety   . Microcytic anemia     as well that must be iron deficiency  . Chest pain   . Hiatal hernia   . Skin cancer of lip     "lower" (2013-04-28)  . Sinus drainage     "all the time" (04-28-13)  . OSA on CPAP   . Type II diabetes mellitus   . History of blood transfusion     "not related to OR; blood count was low" (2013/04/28)  . GERD (gastroesophageal reflux disease)   . Daily headache     "last 2 days" (28-Apr-2013)  . Arthritis     "a little in my back" (04/28/13)  . Chronic lower back pain     "if I lift at all" (04-28-13)  . Nephrolithiasis     "one years ago; passed on it's own" (04/28/13)  . On home oxygen therapy     "2L w/CPAP at night" (April 28, 2013)  . [place] spotted fever ~ 1945   Past Surgical History  Procedure Laterality Date  . Vasectomy      Hx of   . Lumbar disc surgery  ~ 1993  . Inguinal hernia repair Right   . Cardiac catheterization      by Dr. Acie Fredrickson, January 24, 2004, that shows minimal coronary artery irregularities and normal left ventricular function  . Cataract extraction w/ intraocular lens  implant, bilateral Bilateral   . Skin cancer  excision      "lower lip" (2013/04/28)   History reviewed. No pertinent family history. History  Substance Use Topics  . Smoking status: Former Smoker -- 0.00 packs/day for 20 years    Types: Cigarettes  . Smokeless tobacco: Never Used     Comment: 2013-04-28 "quit smoking years ago; didn't smoke that much; probably 20 years; 1ppd"  . Alcohol Use: No    Review of Systems  Neurological: Positive for weakness.  All other systems reviewed and are negative.     Allergies  Codeine  Home Medications   Prior to Admission medications   Medication Sig Start Date End Date Taking? Authorizing Provider  acetaminophen (TYLENOL) 500 MG tablet Take 500 mg by mouth every 6 (six) hours as needed for mild pain.   Yes Historical Provider, MD  amiodarone (PACERONE) 200 MG tablet Take 1 tablet (200 mg) on Monday, Wednesday, Friday only 01/11/14  Yes Thayer Headings, MD  amLODipine (NORVASC) 5 MG tablet Take 1 tablet (5 mg total) by mouth daily. 05/02/13  Yes Thayer Headings, MD  benazepril (LOTENSIN) 40 MG tablet Take 40 mg by mouth daily.     Yes Historical Provider, MD  Cyanocobalamin (  VITAMIN B-12 IJ) Inject as directed every 30 (thirty) days.    Yes Historical Provider, MD  diazepam (VALIUM) 5 MG tablet Take 5 mg by mouth at bedtime.     Yes Historical Provider, MD  fluticasone (FLONASE) 50 MCG/ACT nasal spray Place 1 spray into the nose daily.     Yes Historical Provider, MD  metFORMIN (GLUCOPHAGE) 1000 MG tablet Take 1,000 mg by mouth 2 (two) times daily with a meal.   Yes Historical Provider, MD  metoprolol (LOPRESSOR) 50 MG tablet Take 1 tablet (50 mg total) by mouth 2 (two) times daily. 05/16/13  Yes Thayer Headings, MD  omeprazole (PRILOSEC) 20 MG capsule Take 20 mg by mouth 2 (two) times daily.    Yes Historical Provider, MD  pioglitazone (ACTOS) 30 MG tablet Take 30 mg by mouth daily.     Yes Historical Provider, MD  Rivaroxaban (XARELTO) 20 MG TABS tablet Take 1 tablet (20 mg total) by mouth  daily with supper. 04/09/13  Yes Shanker Kristeen Mans, MD  sertraline (ZOLOFT) 50 MG tablet Take 50 mg by mouth daily.   Yes Historical Provider, MD   BP 165/66  Pulse 63  Temp(Src) 97.7 F (36.5 C) (Oral)  Resp 20  SpO2 95% Physical Exam  Nursing note and vitals reviewed. Constitutional: He is oriented to person, place, and time.  Chronically ill, tired   HENT:  Head: Normocephalic.  Mouth/Throat: Oropharynx is clear and moist.  Eyes: EOM are normal. Pupils are equal, round, and reactive to light.  Conjunctiva slightly pale   Neck: Normal range of motion. Neck supple.  Cardiovascular: Normal rate, regular rhythm and normal heart sounds.   Pulmonary/Chest: Effort normal and breath sounds normal. No respiratory distress. He has no wheezes. He has no rales.  Abdominal: Soft. Bowel sounds are normal. He exhibits no distension. There is no tenderness. There is no rebound and no guarding.  Musculoskeletal: Normal range of motion. He exhibits no edema and no tenderness.  No midline tenderness. Nl ROM bilateral hips   Neurological: He is alert and oriented to person, place, and time.  Nl strength throughout. CN 2-12 intact   Skin: Skin is warm and dry.  Psychiatric: He has a normal mood and affect. His behavior is normal. Judgment and thought content normal.    ED Course  Procedures (including critical care time) Labs Review Labs Reviewed  CBC - Abnormal; Notable for the following:    RBC 3.13 (*)    Hemoglobin 8.5 (*)    HCT 28.1 (*)    RDW 17.6 (*)    All other components within normal limits  BASIC METABOLIC PANEL - Abnormal; Notable for the following:    GFR calc non Af Amer 49 (*)    GFR calc Af Amer 57 (*)    All other components within normal limits  PRO B NATRIURETIC PEPTIDE - Abnormal; Notable for the following:    Pro B Natriuretic peptide (BNP) 841.9 (*)    All other components within normal limits  PROTIME-INR - Abnormal; Notable for the following:    Prothrombin Time  15.7 (*)    All other components within normal limits  I-STAT CHEM 8, ED - Abnormal; Notable for the following:    Creatinine, Ser 1.40 (*)    Hemoglobin 9.9 (*)    HCT 29.0 (*)    All other components within normal limits  URINALYSIS, ROUTINE W REFLEX MICROSCOPIC  I-STAT TROPOININ, ED  POC OCCULT BLOOD, ED  TYPE AND  SCREEN  ABO/RH    Imaging Review Dg Chest 2 View  03/30/2014   CLINICAL DATA:  Weakness.  Hypertension.  EXAM: CHEST  2 VIEW  COMPARISON:  04/06/2013  FINDINGS: Cardiac silhouette is normal in size. There is a moderate to large hiatal hernia, stable. Mediastinum is otherwise unremarkable. No hilar masses.  There are reticular type opacities at both lung bases likely combination of scarring and subsegmental atelectasis. No lung consolidation or edema. No pleural effusion or pneumothorax.  Bony thorax is demineralized but grossly intact.  IMPRESSION: No acute cardiopulmonary disease.   Electronically Signed   By: Lajean Manes M.D.   On: 03/30/2014 13:00     EKG Interpretation   Date/Time:  Thursday March 30 2014 11:19:48 EDT Ventricular Rate:  56 PR Interval:  237 QRS Duration: 168 QT Interval:  510 QTC Calculation: 492 R Axis:   -15 Text Interpretation:  sinus rhythm Left bundle branch block No significant  change since last tracing Confirmed by YAO  MD, DAVID ([id number]) on 03/30/2014  11:56:58 AM      MDM   Final diagnoses:  None    TALLIN HART is a 80 y.o. male here with weakness. Consider electrolyte abnormality vs ACS vs sepsis. Will get labs, sepsi workup, trop. I doubt PE given that he is on xarelto.   1:59 PM Electrolytes at baseline. Hg 8.5. Previous hg was several years ago and was around 10. Occ neg, no active bleeding currently but he is on xarelto. Patient not hypoxic with ambulation. I called Dr. Dagmar Hait, PMD, who will f/u with patient next week. Will start on iron empirically. Case management will arrange for home health needs, but he declined.  Return to ED if he has more bleeding, increased weakness.    Wandra Arthurs, MD 03/30/14 Cheney Yao, MD 03/30/14 [phone]

## 2014-04-03 DIAGNOSIS — M5137 Other intervertebral disc degeneration, lumbosacral region: Secondary | ICD-10-CM | POA: Diagnosis not present

## 2014-04-07 DIAGNOSIS — N183 Chronic kidney disease, stage 3 unspecified: Secondary | ICD-10-CM | POA: Diagnosis not present

## 2014-04-07 DIAGNOSIS — I4891 Unspecified atrial fibrillation: Secondary | ICD-10-CM | POA: Diagnosis not present

## 2014-04-07 DIAGNOSIS — E538 Deficiency of other specified B group vitamins: Secondary | ICD-10-CM | POA: Diagnosis not present

## 2014-04-07 DIAGNOSIS — E119 Type 2 diabetes mellitus without complications: Secondary | ICD-10-CM | POA: Diagnosis not present

## 2014-04-07 DIAGNOSIS — R809 Proteinuria, unspecified: Secondary | ICD-10-CM | POA: Diagnosis not present

## 2014-04-07 DIAGNOSIS — R0902 Hypoxemia: Secondary | ICD-10-CM | POA: Diagnosis not present

## 2014-04-07 DIAGNOSIS — D649 Anemia, unspecified: Secondary | ICD-10-CM | POA: Diagnosis not present

## 2014-04-07 DIAGNOSIS — E039 Hypothyroidism, unspecified: Secondary | ICD-10-CM | POA: Diagnosis not present

## 2014-04-07 DIAGNOSIS — Z1331 Encounter for screening for depression: Secondary | ICD-10-CM | POA: Diagnosis not present

## 2014-04-12 DIAGNOSIS — H35379 Puckering of macula, unspecified eye: Secondary | ICD-10-CM | POA: Diagnosis not present

## 2014-04-12 DIAGNOSIS — Z961 Presence of intraocular lens: Secondary | ICD-10-CM | POA: Diagnosis not present

## 2014-04-12 DIAGNOSIS — E119 Type 2 diabetes mellitus without complications: Secondary | ICD-10-CM | POA: Diagnosis not present

## 2014-05-10 DIAGNOSIS — D649 Anemia, unspecified: Secondary | ICD-10-CM | POA: Diagnosis not present

## 2014-05-12 ENCOUNTER — Other Ambulatory Visit (HOSPITAL_COMMUNITY): Payer: Self-pay | Admitting: *Deleted

## 2014-05-15 ENCOUNTER — Ambulatory Visit (HOSPITAL_COMMUNITY)
Admission: RE | Admit: 2014-05-15 | Discharge: 2014-05-15 | Disposition: A | Discharge status: Home / Self Care | Payer: Medicare Other | Source: Ambulatory Visit | Attending: Internal Medicine | Admitting: Internal Medicine

## 2014-05-15 DIAGNOSIS — D649 Anemia, unspecified: Secondary | ICD-10-CM | POA: Insufficient documentation

## 2014-05-15 MED ORDER — SODIUM CHLORIDE 0.9 % IV SOLN
510.0000 mg | INTRAVENOUS | Status: DC
  Administered 2014-05-15: 510 mg via INTRAVENOUS
  Filled 2014-05-15: qty 17

## 2014-05-15 NOTE — Discharge Instructions (Signed)

## 2014-05-22 ENCOUNTER — Encounter (HOSPITAL_COMMUNITY)
Admission: RE | Admit: 2014-05-22 | Discharge: 2014-05-22 | Disposition: A | Discharge status: Home / Self Care | Payer: Medicare Other | Source: Ambulatory Visit | Attending: Internal Medicine | Admitting: Internal Medicine

## 2014-05-22 ENCOUNTER — Ambulatory Visit (HOSPITAL_COMMUNITY): Payer: Medicare Other

## 2014-05-22 DIAGNOSIS — D649 Anemia, unspecified: Secondary | ICD-10-CM | POA: Insufficient documentation

## 2014-05-22 MED ORDER — SODIUM CHLORIDE 0.9 % IV SOLN
510.0000 mg | INTRAVENOUS | Status: DC
  Administered 2014-05-22: 510 mg via INTRAVENOUS
  Filled 2014-05-22: qty 17

## 2014-05-24 ENCOUNTER — Other Ambulatory Visit: Payer: Self-pay | Admitting: Cardiovascular Disease

## 2014-05-29 ENCOUNTER — Encounter (HOSPITAL_COMMUNITY): Payer: Medicare Other

## 2014-05-31 ENCOUNTER — Inpatient Hospital Stay (HOSPITAL_COMMUNITY): Payer: Medicare Other

## 2014-05-31 ENCOUNTER — Emergency Department (HOSPITAL_COMMUNITY): Payer: Medicare Other

## 2014-05-31 ENCOUNTER — Inpatient Hospital Stay (HOSPITAL_COMMUNITY)
Admission: EM | Admit: 2014-05-31 | Discharge: 2014-06-04 | DRG: 243 | Disposition: A | Discharge status: Home / Self Care | Payer: Medicare Other | Attending: Interventional Cardiology | Admitting: Interventional Cardiology

## 2014-05-31 ENCOUNTER — Encounter (HOSPITAL_COMMUNITY)
Admission: EM | Disposition: A | Discharge status: Home / Self Care | Payer: Self-pay | Source: Home / Self Care | Attending: Interventional Cardiology

## 2014-05-31 ENCOUNTER — Encounter (HOSPITAL_COMMUNITY): Payer: Self-pay | Admitting: Emergency Medicine

## 2014-05-31 DIAGNOSIS — I4891 Unspecified atrial fibrillation: Secondary | ICD-10-CM | POA: Diagnosis present

## 2014-05-31 DIAGNOSIS — E876 Hypokalemia: Secondary | ICD-10-CM | POA: Diagnosis not present

## 2014-05-31 DIAGNOSIS — R5383 Other fatigue: Secondary | ICD-10-CM

## 2014-05-31 DIAGNOSIS — I442 Atrioventricular block, complete: Secondary | ICD-10-CM | POA: Diagnosis not present

## 2014-05-31 DIAGNOSIS — R001 Bradycardia, unspecified: Secondary | ICD-10-CM

## 2014-05-31 DIAGNOSIS — I4510 Unspecified right bundle-branch block: Secondary | ICD-10-CM | POA: Diagnosis present

## 2014-05-31 DIAGNOSIS — E119 Type 2 diabetes mellitus without complications: Secondary | ICD-10-CM

## 2014-05-31 DIAGNOSIS — I951 Orthostatic hypotension: Secondary | ICD-10-CM | POA: Diagnosis present

## 2014-05-31 DIAGNOSIS — I459 Conduction disorder, unspecified: Secondary | ICD-10-CM

## 2014-05-31 DIAGNOSIS — R55 Syncope and collapse: Secondary | ICD-10-CM

## 2014-05-31 DIAGNOSIS — G4730 Sleep apnea, unspecified: Secondary | ICD-10-CM

## 2014-05-31 DIAGNOSIS — K219 Gastro-esophageal reflux disease without esophagitis: Secondary | ICD-10-CM | POA: Diagnosis present

## 2014-05-31 DIAGNOSIS — E872 Acidosis, unspecified: Secondary | ICD-10-CM | POA: Insufficient documentation

## 2014-05-31 DIAGNOSIS — D509 Iron deficiency anemia, unspecified: Secondary | ICD-10-CM | POA: Diagnosis present

## 2014-05-31 DIAGNOSIS — I480 Paroxysmal atrial fibrillation: Secondary | ICD-10-CM

## 2014-05-31 DIAGNOSIS — I445 Left posterior fascicular block: Secondary | ICD-10-CM | POA: Diagnosis present

## 2014-05-31 DIAGNOSIS — E1159 Type 2 diabetes mellitus with other circulatory complications: Secondary | ICD-10-CM

## 2014-05-31 DIAGNOSIS — N189 Chronic kidney disease, unspecified: Secondary | ICD-10-CM | POA: Diagnosis not present

## 2014-05-31 DIAGNOSIS — G4733 Obstructive sleep apnea (adult) (pediatric): Secondary | ICD-10-CM | POA: Diagnosis present

## 2014-05-31 DIAGNOSIS — Z7901 Long term (current) use of anticoagulants: Secondary | ICD-10-CM | POA: Diagnosis not present

## 2014-05-31 DIAGNOSIS — Z79899 Other long term (current) drug therapy: Secondary | ICD-10-CM | POA: Diagnosis not present

## 2014-05-31 DIAGNOSIS — I10 Essential (primary) hypertension: Secondary | ICD-10-CM

## 2014-05-31 DIAGNOSIS — N179 Acute kidney failure, unspecified: Secondary | ICD-10-CM | POA: Diagnosis present

## 2014-05-31 DIAGNOSIS — S0990XA Unspecified injury of head, initial encounter: Secondary | ICD-10-CM | POA: Diagnosis not present

## 2014-05-31 DIAGNOSIS — I129 Hypertensive chronic kidney disease with stage 1 through stage 4 chronic kidney disease, or unspecified chronic kidney disease: Secondary | ICD-10-CM | POA: Diagnosis present

## 2014-05-31 DIAGNOSIS — I447 Left bundle-branch block, unspecified: Secondary | ICD-10-CM | POA: Diagnosis present

## 2014-05-31 DIAGNOSIS — D519 Vitamin B12 deficiency anemia, unspecified: Secondary | ICD-10-CM | POA: Diagnosis present

## 2014-05-31 DIAGNOSIS — I517 Cardiomegaly: Secondary | ICD-10-CM | POA: Diagnosis not present

## 2014-05-31 DIAGNOSIS — R42 Dizziness and giddiness: Secondary | ICD-10-CM | POA: Diagnosis not present

## 2014-05-31 DIAGNOSIS — I119 Hypertensive heart disease without heart failure: Secondary | ICD-10-CM | POA: Diagnosis present

## 2014-05-31 DIAGNOSIS — Z6831 Body mass index (BMI) 31.0-31.9, adult: Secondary | ICD-10-CM | POA: Diagnosis not present

## 2014-05-31 DIAGNOSIS — Z87891 Personal history of nicotine dependence: Secondary | ICD-10-CM | POA: Diagnosis not present

## 2014-05-31 DIAGNOSIS — J9811 Atelectasis: Secondary | ICD-10-CM | POA: Diagnosis not present

## 2014-05-31 DIAGNOSIS — Z45010 Encounter for checking and testing of cardiac pacemaker pulse generator [battery]: Secondary | ICD-10-CM | POA: Diagnosis not present

## 2014-05-31 DIAGNOSIS — D649 Anemia, unspecified: Secondary | ICD-10-CM

## 2014-05-31 HISTORY — DX: Left bundle-branch block, unspecified: I44.7

## 2014-05-31 HISTORY — DX: Unspecified atrial fibrillation: I48.91

## 2014-05-31 HISTORY — DX: Chest pain, unspecified: R07.9

## 2014-05-31 HISTORY — DX: Vitamin B12 deficiency anemia, unspecified: D51.9

## 2014-05-31 HISTORY — PX: TEMPORARY PACEMAKER INSERTION: SHX5471

## 2014-05-31 HISTORY — DX: Atrioventricular block, complete: I44.2

## 2014-05-31 HISTORY — DX: Hyperlipidemia, unspecified: E78.5

## 2014-05-31 HISTORY — DX: Orthostatic hypotension: I95.1

## 2014-05-31 LAB — I-STAT CHEM 8, ED
BUN: 17 mg/dL (ref 6–23)
CHLORIDE: 103 meq/L (ref 96–112)
Calcium, Ion: 1.08 mmol/L — ABNORMAL LOW (ref 1.13–1.30)
Creatinine, Ser: 1.7 mg/dL — ABNORMAL HIGH (ref 0.50–1.35)
GLUCOSE: 187 mg/dL — AB (ref 70–99)
HCT: 36 % — ABNORMAL LOW (ref 39.0–52.0)
Hemoglobin: 12.2 g/dL — ABNORMAL LOW (ref 13.0–17.0)
Potassium: 4.1 mEq/L (ref 3.7–5.3)
Sodium: 139 mEq/L (ref 137–147)
TCO2: 21 mmol/L (ref 0–100)

## 2014-05-31 LAB — CBC WITH DIFFERENTIAL/PLATELET
BASOS PCT: 1 % (ref 0–1)
Basophils Absolute: 0.1 10*3/uL (ref 0.0–0.1)
EOS ABS: 0.1 10*3/uL (ref 0.0–0.7)
Eosinophils Relative: 2 % (ref 0–5)
HEMATOCRIT: 33.9 % — AB (ref 39.0–52.0)
HEMOGLOBIN: 10.4 g/dL — AB (ref 13.0–17.0)
Lymphocytes Relative: 20 % (ref 12–46)
Lymphs Abs: 1.4 10*3/uL (ref 0.7–4.0)
MCH: 29.2 pg (ref 26.0–34.0)
MCHC: 30.7 g/dL (ref 30.0–36.0)
MCV: 95.2 fL (ref 78.0–100.0)
MONO ABS: 0.7 10*3/uL (ref 0.1–1.0)
Monocytes Relative: 10 % (ref 3–12)
Neutro Abs: 4.5 10*3/uL (ref 1.7–7.7)
Neutrophils Relative %: 67 % (ref 43–77)
Platelets: 227 10*3/uL (ref 150–400)
RBC: 3.56 MIL/uL — ABNORMAL LOW (ref 4.22–5.81)
RDW: 21 % — AB (ref 11.5–15.5)
WBC: 6.7 10*3/uL (ref 4.0–10.5)

## 2014-05-31 LAB — BASIC METABOLIC PANEL
Anion gap: 18 — ABNORMAL HIGH (ref 5–15)
BUN: 16 mg/dL (ref 6–23)
CALCIUM: 8.9 mg/dL (ref 8.4–10.5)
CHLORIDE: 98 meq/L (ref 96–112)
CO2: 21 meq/L (ref 19–32)
Creatinine, Ser: 1.62 mg/dL — ABNORMAL HIGH (ref 0.50–1.35)
GFR calc Af Amer: 44 mL/min — ABNORMAL LOW (ref >90)
GFR calc non Af Amer: 38 mL/min — ABNORMAL LOW (ref >90)
Glucose, Bld: 185 mg/dL — ABNORMAL HIGH (ref 70–99)
Potassium: 4.2 mEq/L (ref 3.7–5.3)
Sodium: 137 mEq/L (ref 137–147)

## 2014-05-31 LAB — TROPONIN I
Troponin I: <0.3 ng/mL (ref <0.30)
Troponin I: <0.3 ng/mL (ref <0.30)

## 2014-05-31 LAB — MAGNESIUM: MAGNESIUM: 1.3 mg/dL — AB (ref 1.5–2.5)

## 2014-05-31 LAB — MRSA PCR SCREENING: MRSA BY PCR: NEGATIVE

## 2014-05-31 LAB — GLUCOSE, CAPILLARY
GLUCOSE-CAPILLARY: 126 mg/dL — AB (ref 70–99)
GLUCOSE-CAPILLARY: 154 mg/dL — AB (ref 70–99)

## 2014-05-31 LAB — I-STAT CG4 LACTIC ACID, ED: Lactic Acid, Venous: 5 mmol/L — ABNORMAL HIGH (ref 0.5–2.2)

## 2014-05-31 LAB — TSH: TSH: 3.51 u[IU]/mL (ref 0.350–4.500)

## 2014-05-31 SURGERY — TEMPORARY PACEMAKER INSERTION
Anesthesia: LOCAL

## 2014-05-31 MED ORDER — SERTRALINE HCL 50 MG PO TABS
50.0000 mg | ORAL_TABLET | Freq: Every day | ORAL | Status: DC
  Filled 2014-05-31: qty 1

## 2014-05-31 MED ORDER — FENTANYL CITRATE 0.05 MG/ML IJ SOLN: 50.0000 ug | Freq: Four times a day (QID) | INTRAMUSCULAR | Status: DC | PRN

## 2014-05-31 MED ORDER — FENTANYL CITRATE 0.05 MG/ML IJ SOLN: 50.0000 ug | Freq: Once | INTRAMUSCULAR | Status: DC

## 2014-05-31 MED ORDER — SERTRALINE HCL 50 MG PO TABS
50.0000 mg | ORAL_TABLET | Freq: Every day | ORAL | Status: DC
  Administered 2014-05-31 – 2014-06-03 (×4): 50 mg via ORAL
  Filled 2014-05-31 (×5): qty 1

## 2014-05-31 MED ORDER — SODIUM CHLORIDE 0.9 % IV SOLN: 250.0000 mL | INTRAVENOUS | Status: DC | PRN

## 2014-05-31 MED ORDER — FENTANYL CITRATE 0.05 MG/ML IJ SOLN
50.0000 ug | INTRAMUSCULAR | Status: DC | PRN
  Administered 2014-05-31: 50 ug via INTRAVENOUS
  Filled 2014-05-31 (×2): qty 2

## 2014-05-31 MED ORDER — PANTOPRAZOLE SODIUM 40 MG PO TBEC
40.0000 mg | DELAYED_RELEASE_TABLET | Freq: Every day | ORAL | Status: DC
  Administered 2014-06-01 – 2014-06-04 (×4): 40 mg via ORAL
  Filled 2014-05-31 (×5): qty 1

## 2014-05-31 MED ORDER — ACETAMINOPHEN 500 MG PO TABS
500.0000 mg | ORAL_TABLET | Freq: Four times a day (QID) | ORAL | Status: DC | PRN
  Administered 2014-06-01 – 2014-06-03 (×5): 500 mg via ORAL
  Filled 2014-05-31 (×5): qty 1

## 2014-05-31 MED ORDER — INFLUENZA VAC SPLIT QUAD 0.5 ML IM SUSY
0.5000 mL | PREFILLED_SYRINGE | INTRAMUSCULAR | Status: AC
  Administered 2014-06-01: 0.5 mL via INTRAMUSCULAR
  Filled 2014-05-31: qty 0.5

## 2014-05-31 MED ORDER — DIAZEPAM 5 MG PO TABS
5.0000 mg | ORAL_TABLET | Freq: Every evening | ORAL | Status: DC | PRN
  Administered 2014-05-31 – 2014-06-03 (×4): 5 mg via ORAL
  Filled 2014-05-31 (×4): qty 1

## 2014-05-31 MED ORDER — ATROPINE SULFATE 0.1 MG/ML IJ SOLN
INTRAMUSCULAR | Status: AC
  Administered 2014-05-31: 1 mg
  Filled 2014-05-31: qty 30

## 2014-05-31 MED ORDER — FERROUS SULFATE 325 (65 FE) MG PO TABS
325.0000 mg | ORAL_TABLET | Freq: Every day | ORAL | Status: DC
Start: 2014-06-01 — End: 2014-06-04
  Administered 2014-06-01 – 2014-06-04 (×4): 325 mg via ORAL
  Filled 2014-05-31 (×4): qty 1

## 2014-05-31 MED ORDER — SODIUM CHLORIDE 0.9 % IJ SOLN
3.0000 mL | Freq: Two times a day (BID) | INTRAMUSCULAR | Status: DC
  Administered 2014-05-31 – 2014-06-02 (×5): 3 mL via INTRAVENOUS

## 2014-05-31 MED ORDER — HEPARIN (PORCINE) IN NACL 2-0.9 UNIT/ML-% IJ SOLN
INTRAMUSCULAR | Status: AC
  Filled 2014-05-31: qty 500

## 2014-05-31 MED ORDER — LIDOCAINE HCL (PF) 1 % IJ SOLN
INTRAMUSCULAR | Status: AC
  Filled 2014-05-31: qty 30

## 2014-05-31 MED ORDER — INSULIN ASPART 100 UNIT/ML ~~LOC~~ SOLN
0.0000 [IU] | Freq: Three times a day (TID) | SUBCUTANEOUS | Status: DC
Start: 2014-05-31 — End: 2014-06-04
  Administered 2014-06-01 – 2014-06-02 (×4): 1 [IU] via SUBCUTANEOUS
  Administered 2014-06-02 – 2014-06-03 (×2): 2 [IU] via SUBCUTANEOUS
  Administered 2014-06-03: 1 [IU] via SUBCUTANEOUS
  Administered 2014-06-04: 2 [IU] via SUBCUTANEOUS

## 2014-05-31 MED ORDER — SODIUM CHLORIDE 0.9 % IV SOLN
25.0000 ug/h | INTRAVENOUS | Status: DC
  Filled 2014-05-31: qty 50

## 2014-05-31 MED ORDER — SODIUM CHLORIDE 0.9 % IJ SOLN: 3.0000 mL | INTRAMUSCULAR | Status: DC | PRN

## 2014-05-31 NOTE — ED Notes (Addendum)
Per Dr. Reather Converse, 50 mcg fentanyl given - verbal order taken.

## 2014-05-31 NOTE — H&P (Signed)
History and Physical  Patient ID: Richard [name] MRN: [id number], DOB: 01/19/1931 Date of Encounter: 05/31/2014, 3:55 PM Primary Physician: Richard Ringer, MD Primary Cardiologist: Dr. Acie [name]  Chief Complaint: weak, passed out Reason for Admission: complete heart block  HPI: Richard [name] is an 80 y/o M with history of HTN, LBBB, atrial fibrillation/possible flutter 03/2013, DM, HLD, hiatal hernia, and no known significant CAD (minimal irregularities 2005, nonischemic nucs including most recently 05/2012). From notes it looks like he was diagnosed with AF 03/2013 with difficult to control rates despite beta blocker. He was placed on amiodarone and Xarelto and has not had evidence of recurrence in the outpatient setting.  2 weeks ago he reportedly had to have an iron infusion because his Hgb was low. He was at [facility] today and got dizzy while getting out of the truck. He felt very weak all of a sudden then passed out very briefly reportedly striking his head against the pavement. He came to and EMS was called. He was found to have a narrow complex escape rhythm in the 30's. He was brought emergently to the ED. HR did not respond to atropine and he required transcutaneous pacing. A trial of nonpacing was undertaken and the patient had underlying flat line of 5 seconds thus transcutaneous pacing was quickly restarted. BP has remained preserved. He was brought up to the cath lab for emergent temporary wire placement. He has not had any chest pain but has had mild SOB for about a week. He is mentating in the cath lab but does slightly hesitate to answer questions. There is no obvious external head injury. He was on Lopressor 50mg  BID and 200mg  amiodarone MWF.  Past Medical History  Diagnosis Date  . Hypertension   . Anxiety   . Microcytic anemia     as well that must be iron deficiency  . Hiatal hernia   . Skin cancer of lip   . Sinus drainage   . OSA on CPAP   . Type II diabetes mellitus   .  History of blood transfusion     "not related to OR; blood count was low" (04/06/2013)  . GERD (gastroesophageal reflux disease)   . Arthritis   . Nephrolithiasis   . On home oxygen therapy     2L w/CPAP at night  . [place] spotted fever ~ 1945  . LBBB (left bundle branch block)   . Hyperlipidemia   . Atrial fibrillation     a. Dx 03/2013, notes report atrial fibrillation/atrial flutter, placed on amiodarone. NSR in subsequent OV's.  . Chest pain     a. H/o CTA negative for PE 2012, normal cath 2005, normal nucs previously including 05/2012.  . Orthostatic hypotension   . B12 deficiency anemia      Most Recent Cardiac Studies: Nuc 05/2012 Overall Impression: Low risk stress nuclear study. There is mildly decreased uptake in the inferior wall that is likely due to diaphragmatic attenuation. There is no reversible ischemia.  LV Ejection Fraction: 63%. LV Wall Motion: NL LV Function; NL Wall Motion.   Cath 01/2004 DATE OF PROCEDURE: 01/24/2004  DATE OF DISCHARGE:  CARDIAC CATHETERIZATION  Richard [name] is a 80 year old gentleman with a history of hypertension and  diabetes mellitus. He presented with classic symptoms of unstable angina.  He was referred for heart catheterization for further evaluation.  PROCEDURE: Left heart catheterization with coronary angiography. The right  femoral artery was easily cannulated using a modified Seldinger technique.  HEMODYNAMICS: LV  pressure was 123/11 with an aortic pressure of 123/73.  ANGIOGRAPHY:  1. Left main. The left main is relatively smooth and normal.  2. LAD. The left anterior descending artery has minor luminal  irregularities. There are no stenoses greater than 10-20%. There are  several small diagonal branches.  3. The ramus intermediate branch is large and normal.  4. The left circumflex artery is a fairly large vessel. It supplies the  posterolateral branch which is essentially normal.  5. The right coronary artery is very  large and is normal. There are a few  minor luminal irregularities. The posterior descending artery and the  posterolateral segment artery are unremarkable.  6. The left ventriculogram was performed in a 30 RAO position. It reveals  overall normal left ventricular systolic function. Ejection fraction is  approximately 65%. There is no mitral regurgitation.  COMPLICATIONS: None.  CONCLUSIONS:  1. Minimal coronary artery irregularities.  2. Normal left ventricular systolic function. We will continue with a good  diet and exercise program.  Richard [name], M.D.    Surgical History:  Past Surgical History  Procedure Laterality Date  . Vasectomy      Hx of   . Lumbar disc surgery  ~ 1993  . Inguinal hernia repair Right   . Cardiac catheterization      by Dr. Acie [name], January 24, 2004, that shows minimal coronary artery irregularities and normal left ventricular function  . Cataract extraction w/ intraocular lens  implant, bilateral Bilateral   . Skin cancer excision      "lower lip" (04/06/2013)     Home Meds: Prior to Admission medications   Medication Sig Start Date End Date Taking? Authorizing Provider  acetaminophen (TYLENOL) 500 MG tablet Take 500 mg by mouth every 6 (six) hours as needed for mild pain.   Yes Historical Provider, MD  amiodarone (PACERONE) 200 MG tablet Take 1 tablet (200 mg) on Monday, Wednesday, Friday only 01/11/14  Yes Richard Headings, MD  amLODipine (NORVASC) 5 MG tablet Take 1 tablet (5 mg total) by mouth daily. 05/02/13  Yes Richard Headings, MD  benazepril (LOTENSIN) 40 MG tablet Take 40 mg by mouth daily.     Yes Historical Provider, MD  Cyanocobalamin (VITAMIN B-12 IJ) Inject as directed every 30 (thirty) days.    Yes Historical Provider, MD  diazepam (VALIUM) 5 MG tablet Take 5 mg by mouth at bedtime.     Yes Historical Provider, MD  ferrous sulfate 325 (65 FE) MG tablet Take 1 tablet (325 mg total) by mouth daily. 03/30/14  Yes Richard Arthurs, MD  fluticasone  (FLONASE) 50 MCG/ACT nasal spray Place 1 spray into the nose daily.     Yes Historical Provider, MD  metFORMIN (GLUCOPHAGE) 1000 MG tablet Take 1,000 mg by mouth 2 (two) times daily with a meal.   Yes Historical Provider, MD  metoprolol (LOPRESSOR) 50 MG tablet Take 50 mg by mouth 2 (two) times daily.   Yes Historical Provider, MD  omeprazole (PRILOSEC) 20 MG capsule Take 20 mg by mouth daily.    Yes Historical Provider, MD  pioglitazone (ACTOS) 30 MG tablet Take 30 mg by mouth daily.     Yes Historical Provider, MD  Rivaroxaban (XARELTO) 20 MG TABS tablet Take 1 tablet (20 mg total) by mouth daily with supper. 04/09/13  Yes Shanker Kristeen Mans, MD  sertraline (ZOLOFT) 50 MG tablet Take 50 mg by mouth daily.   Yes Historical Provider, MD    Allergies:  Allergies  Allergen Reactions  . Codeine Other (See Comments)    Hallucinations   . Citalopram Swelling and Rash    History   Social History  . Marital Status: Married    Spouse Name: N/A    Number of Children: N/A  . Years of Education: N/A   Occupational History  . Not on file.   Social History Main Topics  . Smoking status: Former Smoker -- 0.00 packs/day for 20 years    Types: Cigarettes  . Smokeless tobacco: Never Used     Comment: 04/06/2013 "quit smoking years ago; didn't smoke that much; probably 20 years; 1ppd"  . Alcohol Use: No  . Drug Use: No  . Sexual Activity: No   Other Topics Concern  . Not on file   Social History Narrative  . No narrative on file     Family History  Problem Relation Age of Onset  . Other      Parents both died of old age; other conditions not known    Review of Systems:No reported bleeding. All other systems reviewed and are otherwise negative except as noted above.  Labs:   Lab Results  Component Value Date   WBC 6.7 05/31/2014   HGB 12.2* 05/31/2014   HCT 36.0* 05/31/2014   MCV 95.2 05/31/2014   PLT 227 05/31/2014    Recent Labs Lab 05/31/14 1400 05/31/14 1422  NA 137 139    K 4.2 4.1  CL 98 103  CO2 21  --   BUN 16 17  CREATININE 1.62* 1.70*  CALCIUM 8.9  --   GLUCOSE 185* 187*    Recent Labs  05/31/14 1400  TROPONINI <0.30   Lab Results  Component Value Date   CHOL 137 07/15/2013   HDL 37.60* 07/15/2013   LDLCALC 84 07/15/2013   TRIG 79.0 07/15/2013   Lab Results  Component Value Date   DDIMER 1.51* 06/06/2011    Radiology/Studies:  Dg Chest Port 1 View  05/31/2014   CLINICAL DATA:  Initial encounter for fatigue with dizziness lightheadedness today  EXAM: PORTABLE CHEST - 1 VIEW  COMPARISON:  03/30/2014  FINDINGS: 1433 hrs. Lordotic positioning foreshortened this the hemi thorax. No focal airspace consolidation or overt pulmonary edema. The cardio pericardial silhouette is enlarged. Imaged bony structures of the thorax are intact. Telemetry leads overlie the chest.  IMPRESSION: Lordotic film without acute cardiopulmonary findings.   Electronically Signed   By: Misty Stanley M.D.   On: 05/31/2014 15:07     EKG: EKGs show complete heart block HR ~30 with wide QRS complex (RBBB morphology) and LPFB, QT prolonged  Physical Exam: Blood pressure 116/77, pulse 60, temperature 98.2 F (36.8 C), resp. rate 19, height 6\' 4"  (1.93 m), weight 260 lb (117.935 kg), SpO2 95.00%. General: Well developed, well nourished WM in no acute distress. Head: Normocephalic, atraumatic, sclera non-icteric, no xanthomas, nares are without discharge.  Neck: Negative for carotid bruits. JVD not elevated. Lungs: Clear bilaterally to auscultation without wheezes, rales, or rhonchi. Breathing is unlabored. Heart: RRR with S1 S2. No murmurs, rubs, or gallops appreciated. Abdomen: Soft, non-tender, non-distended with normoactive bowel sounds. No hepatomegaly. No rebound/guarding. No obvious abdominal masses. Msk:  Strength and tone appear normal for age. Extremities: No clubbing or cyanosis. No edema.  Distal pedal pulses are 2+ and equal bilaterally. Neuro: Alert and  oriented X 3. No focal deficit. No facial asymmetry. Moves all extremities spontaneously. Psych:  Responds to questions appropriately with a normal affect.  ASSESSMENT AND PLAN:  1. Syncope with complete heart block with h/o LBBB; RBBB + LFPB on admitting EKG - s/p temp wire pacemaker 2. HTN 3. Diabetes mellitus 4. Acute kidney injury suspected due to #1 5. Lactic acidosis suspected due to #1 6. H/o anemia, reportedly iron deficiency and B12 deficiency 7. H/o AF in 03/2013, currently in NSR post wire placement  Temp wire was placed. The patient will be admitted to the ICU. Will check stat CT head to exclude any intracranial pathology given syncope on NOAC. Per discussion with Dr. Tamala Julian, will hold off on Xarelto as well as parenteral anticoagulants until we can observe the patient overnight for stability. We could consider resuming this in AM if stable. Will check echo, troponins, magnesium level, hemoccult. He will be seen by electrophysiology for evaluation. Will hold antihypertensives until we can see trend of blood pressure. We will also hold metoprolol and amiodarone due to bradycardia - although with amiodarone's half life, this will likely still remain in his system for a while. Check thyroid function. Hold Metformin and Actos due to renal insufficiency and possible intermittent NPO status. Will also hold QHS valium so we can monitor his mental status s/p fall. Add PRN SSI.  Signed, Melina Copa PA-C 05/31/2014, 3:55 PM

## 2014-05-31 NOTE — Consult Note (Signed)
ELECTROPHYSIOLOGY CONSULT NOTE  Patient ID: Richard [name], MRN: [id number], DOB/AGE: 78-Oct-1932 78 y.o. Admit date: 05/31/2014 Date of Consult: 05/31/2014  Primary Physician: Tivis Ringer, MD Primary Cardiologist: [facility]   Chief Complaint: SYNCOPE   HPI Richard [name] is a 80 y.o. male  With hx of Afib Rx with BB and amiodarone in context of LBBB  Today he collapssed and found that he was in CHB with ventricular escape >>temp pacer now with intrinsic conduction;  He hit his head and CT scan in pending  At baseline has moderate and progressive exercise intolerance, edema and orthopnea  Also has treated OSA  He has been found to be iron deficient and has been getting iron supplementation  Also with DM and HTN  Cath 2005 without obstructive CAD Nuc 2013  >>low risk without ischemia  EF 63%  Past Medical History  Diagnosis Date  . Hypertension   . Anxiety   . Microcytic anemia     as well that must be iron deficiency  . Hiatal hernia   . Skin cancer of lip   . Sinus drainage   . OSA on CPAP   . Type II diabetes mellitus   . History of blood transfusion     "not related to OR; blood count was low" (04/06/2013)  . GERD (gastroesophageal reflux disease)   . Arthritis   . Nephrolithiasis   . On home oxygen therapy     2L w/CPAP at night  . [place] spotted fever ~ 1945  . LBBB (left bundle branch block)   . Hyperlipidemia   . Atrial fibrillation     a. Dx 03/2013, notes report atrial fibrillation/atrial flutter, placed on amiodarone. NSR in subsequent OV's.  . Chest pain     a. H/o CTA negative for PE 2012, normal cath 2005, normal nucs previously including 05/2012.  . Orthostatic hypotension   . B12 deficiency anemia       Surgical History:  Past Surgical History  Procedure Laterality Date  . Vasectomy      Hx of   . Lumbar disc surgery  ~ 1993  . Inguinal hernia repair Right   . Cardiac catheterization      by Dr. Acie Fredrickson, January 24, 2004, that shows  minimal coronary artery irregularities and normal left ventricular function  . Cataract extraction w/ intraocular lens  implant, bilateral Bilateral   . Skin cancer excision      "lower lip" (04/06/2013)     Home Meds: Prior to Admission medications   Medication Sig Start Date End Date Taking? Authorizing Provider  acetaminophen (TYLENOL) 500 MG tablet Take 500 mg by mouth every 6 (six) hours as needed for mild pain.   Yes Historical Provider, MD  amiodarone (PACERONE) 200 MG tablet Take 1 tablet (200 mg) on Monday, Wednesday, Friday only 01/11/14  Yes Thayer Headings, MD  amLODipine (NORVASC) 5 MG tablet Take 1 tablet (5 mg total) by mouth daily. 05/02/13  Yes Thayer Headings, MD  benazepril (LOTENSIN) 40 MG tablet Take 40 mg by mouth daily.     Yes Historical Provider, MD  Cyanocobalamin (VITAMIN B-12 IJ) Inject as directed every 30 (thirty) days.    Yes Historical Provider, MD  diazepam (VALIUM) 5 MG tablet Take 5 mg by mouth at bedtime.     Yes Historical Provider, MD  ferrous sulfate 325 (65 FE) MG tablet Take 1 tablet (325 mg total) by mouth daily. 03/30/14  Yes Wandra Arthurs,  MD  fluticasone (FLONASE) 50 MCG/ACT nasal spray Place 1 spray into the nose daily.     Yes Historical Provider, MD  metFORMIN (GLUCOPHAGE) 1000 MG tablet Take 1,000 mg by mouth 2 (two) times daily with a meal.   Yes Historical Provider, MD  metoprolol (LOPRESSOR) 50 MG tablet Take 50 mg by mouth 2 (two) times daily.   Yes Historical Provider, MD  omeprazole (PRILOSEC) 20 MG capsule Take 20 mg by mouth daily.    Yes Historical Provider, MD  pioglitazone (ACTOS) 30 MG tablet Take 30 mg by mouth daily.     Yes Historical Provider, MD  Rivaroxaban (XARELTO) 20 MG TABS tablet Take 1 tablet (20 mg total) by mouth daily with supper. 04/09/13  Yes Shanker Kristeen Mans, MD  sertraline (ZOLOFT) 50 MG tablet Take 50 mg by mouth daily.   Yes Historical Provider, MD    Inpatient Medications:  . [START ON 06/01/2014] ferrous sulfate  325  mg Oral Daily  . [START ON 06/01/2014] Influenza vac split quadrivalent PF  0.5 mL Intramuscular Tomorrow-1000  . insulin aspart  0-9 Units Subcutaneous TID WC  . [START ON 06/01/2014] pantoprazole  40 mg Oral Daily  . sertraline  50 mg Oral Daily  . sodium chloride  3 mL Intravenous Q12H     Allergies:  Allergies  Allergen Reactions  . Codeine Other (See Comments)    Hallucinations   . Citalopram Swelling and Rash    History   Social History  . Marital Status: Married    Spouse Name: N/A    Number of Children: N/A  . Years of Education: N/A   Occupational History  . Not on file.   Social History Main Topics  . Smoking status: Former Smoker -- 0.00 packs/day for 20 years    Types: Cigarettes  . Smokeless tobacco: Never Used     Comment: 04/06/2013 "quit smoking years ago; didn't smoke that much; probably 20 years; 1ppd"  . Alcohol Use: No  . Drug Use: No  . Sexual Activity: No   Other Topics Concern  . Not on file   Social History Narrative  . No narrative on file     Family History  Problem Relation Age of Onset  . Other      Parents both died of old age; other conditions not known     ROS:  Please see the history of present illness.     All other systems reviewed and negative.    Physical Exam:   Blood pressure 150/71, pulse 82, temperature 98.2 F (36.8 C), temperature source Oral, resp. rate 21, height 6\' 4"  (1.93 m), weight 260 lb (117.935 kg), SpO2 95.00%. General: Well developed, well nourished obese  male in no acute distress. Head: Normocephalic, atraumatic, sclera non-icteric, no xanthomas, nares are without discharge. EENT: edentulous Lymph Nodes:  none Back: not examined with pacer in leg no CVA tendersness Neck: Negative for carotid bruits. JVD not elevated. Lungs: Clear bilaterally to auscultation without wheezes, rales, or rhonchi. Breathing is unlabored. Heart: RRR with S1 S2. N 2/6 systolic murmur , rubs, or gallops appreciated. Abdomen:  Soft, non-tender, non-distended with normoactive bowel sounds. No hepatomegaly. No rebound/guarding. No obvious abdominal masses. Msk:  Strength and tone appear normal for age. Extremities: No clubbing or cyanosis.  2+ edema.  Distal pedal pulses are 2+ and equal bilaterally. Skin: Warm and Dry Neuro: Alert and oriented X 3. CN III-XII intact Grossly normal sensory and motor function . Psych:  Responds  to questions appropriately with a normal affect.      Labs: Cardiac Enzymes  Recent Labs  05/31/14 1400  TROPONINI <0.30   CBC Lab Results  Component Value Date   WBC 6.7 05/31/2014   HGB 12.2* 05/31/2014   HCT 36.0* 05/31/2014   MCV 95.2 05/31/2014   PLT 227 05/31/2014   PROTIME: No results found for this basename: LABPROT, INR,  in the last 72 hours Chemistry  Recent Labs Lab 05/31/14 1400 05/31/14 1422  NA 137 139  K 4.2 4.1  CL 98 103  CO2 21  --   BUN 16 17  CREATININE 1.62* 1.70*  CALCIUM 8.9  --   GLUCOSE 185* 187*   Lipids Lab Results  Component Value Date   CHOL 137 07/15/2013   HDL 37.60* 07/15/2013   LDLCALC 84 07/15/2013   TRIG 79.0 07/15/2013   BNP Pro B Natriuretic peptide (BNP)  Date/Time Value Ref Range Status  03/30/2014 11:28 AM 841.9* 0 - 450 pg/mL Final  06/06/2011  8:40 AM 218.6  0 - 450 pg/mL Final   Miscellaneous Lab Results  Component Value Date   DDIMER 1.51* 06/06/2011    Radiology/Studies:  Dg Chest Port 1 View  05/31/2014   CLINICAL DATA:  Initial encounter for fatigue with dizziness lightheadedness today  EXAM: PORTABLE CHEST - 1 VIEW  COMPARISON:  03/30/2014  FINDINGS: 1433 hrs. Lordotic positioning foreshortened this the hemi thorax. No focal airspace consolidation or overt pulmonary edema. The cardio pericardial silhouette is enlarged. Imaged bony structures of the thorax are intact. Telemetry leads overlie the chest.  IMPRESSION: Lordotic film without acute cardiopulmonary findings.   Electronically Signed   By: Misty Stanley  M.D.   On: 05/31/2014 15:07    EKG: * NSR WITH LBBB On arrival  NSR w CHB and RBBB   Assessment and Plan:  SYNCOPE  LBBB  Orthostatic lightheadedness  INTERMITTENT CHB  Pt with intermittent complete heart block in context of LBBB and amio/metoprolol therapy  He has had presyncopal spells before which are very brief and possibly arrhythmic as well as freq orthostatic lightheadedness  Metoprolol t1/2 is about 5 (3-7) hrs and thus should be gone by am, as last dose 8 am.  Obviously amio will be around forever.  It is unlikely that the metoprolol is the culprit, but we are obliged and now relaxed ( temp pacer in place) as we watch for the next 24 or so hours.    If nothing is seen by Friday ( 48 h) i would be inclined with ambivalence to use a loop recorder.  i think he will com to pacing before we are done    Virl Axe

## 2014-05-31 NOTE — H&P (Signed)
Richard [name] is followed by Dr. Acie Fredrickson. He has a history of atrial fibrillation and is on beta blocker and amiodarone therapy. She has an underlying left bundle branch block. Today while shopping at [facility] [employer], he had a syncopal episode. He was found to have profound bradycardia. There is also history that he may have his head when he fainted. In the emergency room. In the emergency room he had a heart rate of 30 beats per minute with a narrow complex escape rhythm and background sinus rhythm but complete heart block. External pacing was started. Eventually the pacing device was stopped and the patient had high-grade AV block with P waves present but no ventricular conduction. He had transient seizure-like activity until external pacing was resumed. In the cath lab the patient is arousable. He denies chest discomfort. He denies dyspnea.  He underwent emergency transvenous temporary pacemaker insertion via the right femoral without complication.  Examination after the procedure demonstrated that all pulses were equal. His skin color was normal. No carotid bruits are heard. Lungs are clear anteriorly. Cardiac exam reveals distant heart sounds but no obvious murmur or rub. Abdomen is soft. There is no tenderness. Extremities reveal no edema. Neurologically the patient is sluggish but appropriately answers questions. No focal deficit is obvious that this early stage.  EKG done in the emergency room demonstrated complete heart block with a narrow complex escape rhythm at 30 beats per minute. There are marked repolarization changes noted.  IMPRESSION: Profound bradycardia due to high-grade/complete AV block with a narrow complex ventricular escape rhythm at 30 beats per minute. In absence of chest pain I doubt that the bradycardia is related to ischemia.  History of atrial fibrillation  Diabetes mellitus, complicated  Hypertension, essential  Obstructive sleep apnea  PLAN:  Discontinue  amiodarone  Discontinue metoprolol  Hold Xarelto in any form of anticoagulation until CT of the head is performed. Will resume at the very least DVT prophylaxis once cleared.  Hold amlodipine  Serial markers to rule out myocardial infarction.  Temporary pacing to allow an opportunity for recovery of conduction versus decision about permanent pacing.  CT scan without contrast to rule out intracranial bleed related to trauma and anticoagulation   2-D Doppler echocardiogram  Admit to coronary care unit  Check BNP, TSH, along with a comprehensive metabolic panel.

## 2014-05-31 NOTE — ED Provider Notes (Signed)
CSN: [id number]     Arrival date & time 05/31/14  1353 History   First MD Initiated Contact with Patient 05/31/14 1405     Chief Complaint  Patient presents with  . Dizziness  . Loss of Consciousness     (Consider location/radiation/quality/duration/timing/severity/associated sxs/prior Treatment) HPI Comments: 80 year old with history of high blood pressure, diabetes, atrial fibrillation presents after syncope episode followed by persistent lightheaded and fatigued. No history of similar. Mild head injury however patient denies headache or vomiting. Patient denies blood in the stools.  No chest pain or shortness of breath. Patient has mild lower extremity swelling bilateral. No history of heart block known.  Patient is a 80 y.o. male presenting with dizziness and syncope. The history is provided by the patient.  Dizziness Associated symptoms: syncope   Associated symptoms: no chest pain, no headaches, no shortness of breath and no vomiting   Loss of Consciousness Associated symptoms: dizziness   Associated symptoms: no chest pain, no fever, no headaches, no shortness of breath and no vomiting     Past Medical History  Diagnosis Date  . Hypertension   . Anxiety   . Microcytic anemia     as well that must be iron deficiency  . Hiatal hernia   . Skin cancer of lip   . Sinus drainage   . OSA on CPAP   . Type II diabetes mellitus   . History of blood transfusion     "not related to OR; blood count was low" (04/06/2013)  . GERD (gastroesophageal reflux disease)   . Arthritis   . Nephrolithiasis   . On home oxygen therapy     2L w/CPAP at night  . [place] spotted fever ~ 1945  . LBBB (left bundle branch block)   . Hyperlipidemia   . Atrial fibrillation     a. Dx 03/2013, notes report atrial fibrillation/atrial flutter, placed on amiodarone. NSR in subsequent OV's.  . Chest pain     a. H/o CTA negative for PE 2012, normal cath 2005, normal nucs previously including 05/2012.   . Orthostatic hypotension   . B12 deficiency anemia    Past Surgical History  Procedure Laterality Date  . Vasectomy      Hx of   . Lumbar disc surgery  ~ 1993  . Inguinal hernia repair Right   . Cardiac catheterization      by Dr. Acie Fredrickson, January 24, 2004, that shows minimal coronary artery irregularities and normal left ventricular function  . Cataract extraction w/ intraocular lens  implant, bilateral Bilateral   . Skin cancer excision      "lower lip" (04/06/2013)   No family history on file. History  Substance Use Topics  . Smoking status: Former Smoker -- 0.00 packs/day for 20 years    Types: Cigarettes  . Smokeless tobacco: Never Used     Comment: 04/06/2013 "quit smoking years ago; didn't smoke that much; probably 20 years; 1ppd"  . Alcohol Use: No    Review of Systems  Constitutional: Positive for fatigue. Negative for fever and chills.  HENT: Negative for congestion.   Eyes: Negative for visual disturbance.  Respiratory: Negative for shortness of breath.   Cardiovascular: Positive for syncope. Negative for chest pain.  Gastrointestinal: Negative for vomiting and abdominal pain.  Genitourinary: Negative for dysuria and flank pain.  Musculoskeletal: Negative for back pain, neck pain and neck stiffness.  Skin: Negative for rash.  Neurological: Positive for dizziness, syncope and light-headedness. Negative for headaches.  Allergies  Codeine and Citalopram  Home Medications   Prior to Admission medications   Medication Sig Start Date End Date Taking? Authorizing Provider  acetaminophen (TYLENOL) 500 MG tablet Take 500 mg by mouth every 6 (six) hours as needed for mild pain.   Yes Historical Provider, MD  amiodarone (PACERONE) 200 MG tablet Take 1 tablet (200 mg) on Monday, Wednesday, Friday only 01/11/14  Yes Thayer Headings, MD  amLODipine (NORVASC) 5 MG tablet Take 1 tablet (5 mg total) by mouth daily. 05/02/13  Yes Thayer Headings, MD  benazepril (LOTENSIN) 40  MG tablet Take 40 mg by mouth daily.     Yes Historical Provider, MD  Cyanocobalamin (VITAMIN B-12 IJ) Inject as directed every 30 (thirty) days.    Yes Historical Provider, MD  diazepam (VALIUM) 5 MG tablet Take 5 mg by mouth at bedtime.     Yes Historical Provider, MD  ferrous sulfate 325 (65 FE) MG tablet Take 1 tablet (325 mg total) by mouth daily. 03/30/14  Yes Wandra Arthurs, MD  fluticasone (FLONASE) 50 MCG/ACT nasal spray Place 1 spray into the nose daily.     Yes Historical Provider, MD  metFORMIN (GLUCOPHAGE) 1000 MG tablet Take 1,000 mg by mouth 2 (two) times daily with a meal.   Yes Historical Provider, MD  metoprolol (LOPRESSOR) 50 MG tablet Take 50 mg by mouth 2 (two) times daily.   Yes Historical Provider, MD  omeprazole (PRILOSEC) 20 MG capsule Take 20 mg by mouth daily.    Yes Historical Provider, MD  pioglitazone (ACTOS) 30 MG tablet Take 30 mg by mouth daily.     Yes Historical Provider, MD  Rivaroxaban (XARELTO) 20 MG TABS tablet Take 1 tablet (20 mg total) by mouth daily with supper. 04/09/13  Yes Shanker Kristeen Mans, MD  sertraline (ZOLOFT) 50 MG tablet Take 50 mg by mouth daily.   Yes Historical Provider, MD   BP 116/77  Pulse 60  Temp(Src) 98.2 F (36.8 C)  Resp 19  Ht 6\' 4"  (1.93 m)  Wt 260 lb (117.935 kg)  BMI 31.66 kg/m2  SpO2 95% Physical Exam  Nursing note and vitals reviewed. Constitutional: He is oriented to person, place, and time. He appears well-developed and well-nourished.  HENT:  Head: Normocephalic and atraumatic.   dry mucous membranes  Eyes: Conjunctivae are normal. Right eye exhibits no discharge. Left eye exhibits no discharge.  Neck: Normal range of motion. Neck supple. No tracheal deviation present.  Cardiovascular: Regular rhythm.  Bradycardia present.   Pulmonary/Chest: Effort normal and breath sounds normal.  Abdominal: Soft. He exhibits no distension. There is no tenderness. There is no guarding.  Musculoskeletal: He exhibits edema (mild  bilateral lower extremities).  Neurological: He is alert and oriented to person, place, and time.  Skin: Skin is warm. No rash noted. There is pallor.  Psychiatric: He has a normal mood and affect.    ED Course  Procedures (including critical care time) CRITICAL CARE Performed by: Mariea Clonts   Total critical care time: 35 min  Critical care time was exclusive of separately billable procedures and treating other patients.  Critical care was necessary to treat or prevent imminent or life-threatening deterioration.  Critical care was time spent personally by me on the following activities: development of treatment plan with patient and/or surrogate as well as nursing, discussions with consultants, evaluation of patient's response to treatment, examination of patient, obtaining history from patient or surrogate, ordering and performing treatments and  interventions, ordering and review of laboratory studies, ordering and review of radiographic studies, pulse oximetry and re-evaluation of patient's condition.  Labs Review Labs Reviewed  BASIC METABOLIC PANEL - Abnormal; Notable for the following:    Glucose, Bld 185 (*)    Creatinine, Ser 1.62 (*)    GFR calc non Af Amer 38 (*)    GFR calc Af Amer 44 (*)    Anion gap 18 (*)    All other components within normal limits  CBC WITH DIFFERENTIAL - Abnormal; Notable for the following:    RBC 3.56 (*)    Hemoglobin 10.4 (*)    HCT 33.9 (*)    RDW 21.0 (*)    All other components within normal limits  I-STAT CHEM 8, ED - Abnormal; Notable for the following:    Creatinine, Ser 1.70 (*)    Glucose, Bld 187 (*)    Calcium, Ion 1.08 (*)    Hemoglobin 12.2 (*)    HCT 36.0 (*)    All other components within normal limits  I-STAT CG4 LACTIC ACID, ED - Abnormal; Notable for the following:    Lactic Acid, Venous 5.00 (*)    All other components within normal limits  TROPONIN I    Imaging Review Dg Chest Port 1 View  05/31/2014    CLINICAL DATA:  Initial encounter for fatigue with dizziness lightheadedness today  EXAM: PORTABLE CHEST - 1 VIEW  COMPARISON:  03/30/2014  FINDINGS: 1433 hrs. Lordotic positioning foreshortened this the hemi thorax. No focal airspace consolidation or overt pulmonary edema. The cardio pericardial silhouette is enlarged. Imaged bony structures of the thorax are intact. Telemetry leads overlie the chest.  IMPRESSION: Lordotic film without acute cardiopulmonary findings.   Electronically Signed   By: Misty Stanley M.D.   On: 05/31/2014 15:07     EKG Interpretation   Date/Time:  Wednesday May 31 2014 14:09:50 EDT Ventricular Rate:  30 PR Interval:    QRS Duration: 176 QT Interval:  682 QTC Calculation: 482 R Axis:   100 Text Interpretation:  Junctional rhythm RBBB and LPFB Poor baseline,  concern for complete vs 2:1 heart block Confirmed by Chanta Bauers  MD, Emitt Maglione  ([id number]) on 05/31/2014 3:54:16 PM      Repeat EKG heart rate 30, long QT, concern for complete heart block versus 2:1 heart block. MDM   Final diagnoses:  Fatigue  Complete heart block  Syncope and collapse  CRF (chronic renal failure), unspecified stage  Symptomatic bradycardia   Patient presented with symptomatic bradycardia. Initial EKG reviewed concern for 2-1 block first complete heart block. Repeat EKG reviewed and then rhythm strip reviewed. Rhythm strip heart rate 30, atrial beats regularly marched out, prolonged QT, complete heart block.  With significant symptoms pain medicines given and patient paced at the bedside 80/60 beats per minute. Symptoms improved with pacing.  Cardiology consult emergently and came down to evaluate the patient agrees with assessment and plan for emergent venous pacemaker. Patient taken to the Cath Lab emergently.  The patients results and plan were reviewed and discussed.   Any x-rays performed were personally reviewed by myself.   Differential diagnosis were considered with the  presenting HPI.  Medications  fentaNYL (SUBLIMAZE) 2,500 mcg in sodium chloride 0.9 % 250 mL (10 mcg/mL) infusion (not administered)  fentaNYL (SUBLIMAZE) injection 50 mcg ( Intravenous MAR Hold 05/31/14 1504)  fentaNYL (SUBLIMAZE) injection 50 mcg ( Intravenous MAR Hold 05/31/14 1504)  atropine 0.1 MG/ML injection (1 mg  Given 05/31/14  1414)  lidocaine (PF) (XYLOCAINE) 1 % injection (not administered)  heparin 2-0.9 UNIT/ML-% infusion (not administered)    Filed Vitals:   05/31/14 1407 05/31/14 1415 05/31/14 1416 05/31/14 1446  BP: 137/45 122/40  116/77  Pulse: 31 30 30  60  Temp:      Resp: 12 17 19    Height:      Weight:      SpO2: 95% 97% 97% 95%    Final diagnoses:  Fatigue  Complete heart block  Syncope and collapse    Admission/ observation were discussed with the admitting physician, patient and/or family and they are comfortable with the plan.      Mariea Clonts, MD 05/31/14 [phone]

## 2014-05-31 NOTE — ED Notes (Signed)
Dr Zavitz at bedside  

## 2014-05-31 NOTE — ED Notes (Signed)
Pt. Placed on pacer with Dr. Reather Converse in room.

## 2014-05-31 NOTE — Progress Notes (Signed)
eLink Physician-Brief Progress Note Patient Name: Richard [name] DOB: Nov 30, 1930 MRN: [id number]   Date of Service  05/31/2014  HPI/Events of Note  Complete heart block, pacer in place.  Chart reviewed.  eICU Interventions  Chart reviewed, nothing further to add.        YACOUB,WESAM 05/31/2014, 4:12 PM

## 2014-05-31 NOTE — CV Procedure (Signed)
     Emergency Transvenous Temporary Pacemaker Report  NATNAEL BIEDERMAN  80 y.o.  male Jul 24, 1931  Procedure Date: 05/31/2014 Referring Physician: Dola Argyle, M.D. Primary Cardiologist: Mertie Moores,, M.D.  INDICATIONS: Profound bradycardia with high-grade AV block  PROCEDURE: Emergency transvenous temporary pacemaker   PROCEDURE TECHNIQUE:  After Xylocaine anesthesia a 6 French sheath was placed in the right femoral vein using the modified Seldinger technique.  The stick was a clean anterior wall stick on the first pass. We then used fluoroscopic guidance to float a 5 French balloon tipped bipolar pacemaker to the right ventricular apex. Once in the ventricular apex the device was activated and the external pacing device discontinued. We then established a threshold of less than 1 milliamp. Final pacing parameters were set at a rate of 80 beats per minute with an output of 4 MA.   CONTRAST:  Total of 0 cc.  COMPLICATIONS:  None   HEMODYNAMICS:  Aortic pressure 117/78 mmHg by pressure cuff post pacer insertion.   IMPRESSIONS:  Successful temporary transvenous pacemaker via the right femoral approach.   RECOMMENDATION:   Discontinue medications that produce bradycardia. Consider permanent pacemaker therapy as required after an adequate time frame off bradycardia inducing medications if bradycardia continues.Marland Kitchen

## 2014-05-31 NOTE — ED Notes (Signed)
Cardiologist at bedside.  

## 2014-05-31 NOTE — ED Notes (Signed)
Pt states he was at [facility] today at 1200 when he got dizzy while getting out of the truck and had syncopal episode striking his head against the pavement, pt currently taking blood thinners. No obvious head injury noted. Pt denies any numbness or tingling to arms or legs. Pt states recently had to have iron because his hgb was low x2 weeks ago. Pt denies any cp at this time but reports some increased sob x1 week. EKG done.

## 2014-06-01 DIAGNOSIS — I517 Cardiomegaly: Secondary | ICD-10-CM

## 2014-06-01 DIAGNOSIS — I442 Atrioventricular block, complete: Secondary | ICD-10-CM | POA: Diagnosis not present

## 2014-06-01 LAB — COMPREHENSIVE METABOLIC PANEL
ALBUMIN: 2.8 g/dL — AB (ref 3.5–5.2)
ALT: 24 U/L (ref 0–53)
AST: 35 U/L (ref 0–37)
Alkaline Phosphatase: 63 U/L (ref 39–117)
Anion gap: 14 (ref 5–15)
BUN: 14 mg/dL (ref 6–23)
CO2: 23 mEq/L (ref 19–32)
CREATININE: 1.04 mg/dL (ref 0.50–1.35)
Calcium: 7.7 mg/dL — ABNORMAL LOW (ref 8.4–10.5)
Chloride: 105 mEq/L (ref 96–112)
GFR calc Af Amer: 76 mL/min — ABNORMAL LOW (ref >90)
GFR calc non Af Amer: 65 mL/min — ABNORMAL LOW (ref >90)
Glucose, Bld: 118 mg/dL — ABNORMAL HIGH (ref 70–99)
Potassium: 3.2 mEq/L — ABNORMAL LOW (ref 3.7–5.3)
Sodium: 142 mEq/L (ref 137–147)
TOTAL PROTEIN: 6.2 g/dL (ref 6.0–8.3)
Total Bilirubin: 0.3 mg/dL (ref 0.3–1.2)

## 2014-06-01 LAB — CBC
HEMATOCRIT: 29.9 % — AB (ref 39.0–52.0)
Hemoglobin: 9.2 g/dL — ABNORMAL LOW (ref 13.0–17.0)
MCH: 29.5 pg (ref 26.0–34.0)
MCHC: 30.8 g/dL (ref 30.0–36.0)
MCV: 95.8 fL (ref 78.0–100.0)
Platelets: 132 10*3/uL — ABNORMAL LOW (ref 150–400)
RBC: 3.12 MIL/uL — AB (ref 4.22–5.81)
RDW: 21.1 % — ABNORMAL HIGH (ref 11.5–15.5)
WBC: 3.4 10*3/uL — ABNORMAL LOW (ref 4.0–10.5)

## 2014-06-01 LAB — GLUCOSE, CAPILLARY
GLUCOSE-CAPILLARY: 137 mg/dL — AB (ref 70–99)
GLUCOSE-CAPILLARY: 135 mg/dL — AB (ref 70–99)
Glucose-Capillary: 101 mg/dL — ABNORMAL HIGH (ref 70–99)
Glucose-Capillary: 158 mg/dL — ABNORMAL HIGH (ref 70–99)

## 2014-06-01 LAB — LIPID PANEL
CHOL/HDL RATIO: 3.6 ratio
CHOLESTEROL: 111 mg/dL (ref 0–200)
HDL: 31 mg/dL — ABNORMAL LOW (ref >39)
LDL CALC: 48 mg/dL (ref 0–99)
TRIGLYCERIDES: 159 mg/dL — AB (ref <150)
VLDL: 32 mg/dL (ref 0–40)

## 2014-06-01 LAB — MAGNESIUM: MAGNESIUM: 1.2 mg/dL — AB (ref 1.5–2.5)

## 2014-06-01 LAB — T4, FREE: FREE T4: 1.35 ng/dL (ref 0.80–1.80)

## 2014-06-01 LAB — TROPONIN I: Troponin I: <0.3 ng/mL (ref <0.30)

## 2014-06-01 MED ORDER — LOSARTAN POTASSIUM 50 MG PO TABS
50.0000 mg | ORAL_TABLET | Freq: Every day | ORAL | Status: DC
  Administered 2014-06-01 – 2014-06-04 (×4): 50 mg via ORAL
  Filled 2014-06-01 (×4): qty 1

## 2014-06-01 MED ORDER — HYDRALAZINE HCL 20 MG/ML IJ SOLN
INTRAMUSCULAR | Status: AC
  Filled 2014-06-01: qty 1

## 2014-06-01 MED ORDER — MAGNESIUM SULFATE IN D5W 10-5 MG/ML-% IV SOLN
1.0000 g | Freq: Once | INTRAVENOUS | Status: DC
  Filled 2014-06-01: qty 100

## 2014-06-01 MED ORDER — HYDRALAZINE HCL 20 MG/ML IJ SOLN
10.0000 mg | INTRAMUSCULAR | Status: DC | PRN
  Administered 2014-06-01 – 2014-06-02 (×6): 10 mg via INTRAVENOUS
  Filled 2014-06-01 (×6): qty 1

## 2014-06-01 MED ORDER — MAGNESIUM SULFATE 40 MG/ML IJ SOLN
2.0000 g | Freq: Once | INTRAMUSCULAR | Status: AC
  Administered 2014-06-01: 2 g via INTRAVENOUS
  Filled 2014-06-01: qty 50

## 2014-06-01 MED ORDER — POTASSIUM CHLORIDE CRYS ER 20 MEQ PO TBCR
20.0000 meq | EXTENDED_RELEASE_TABLET | Freq: Two times a day (BID) | ORAL | Status: AC
  Administered 2014-06-01 (×2): 20 meq via ORAL
  Filled 2014-06-01 (×2): qty 1

## 2014-06-01 NOTE — Progress Notes (Signed)
Utilization Review Completed.Donne Anon T10/03/2014

## 2014-06-01 NOTE — Progress Notes (Signed)
ELECTROPHYSIOLOGY ROUNDING NOTE    Patient Name: Richard [name] Date of Encounter: 06/01/2014    SUBJECTIVE:Patient states that he has a headache this morning.  No chest pain, shortness of breath, dizziness.   Echocardiogram pending this admission.   TELEMETRY: Reviewed telemetry pt in sinus rhythm, no ventricular pacing since yesterday afternoon Filed Vitals:   06/01/14 0200 06/01/14 0300 06/01/14 0400 06/01/14 0500  BP: 166/74 154/93 176/83 183/71  Pulse: 72 73 77 73  Temp:   97.9 F (36.6 C)   TempSrc:   Oral   Resp: 21 14 18 15   Height:      Weight:      SpO2: 85% 94% 94% 95%    Intake/Output Summary (Last 24 hours) at 06/01/14 0713 Last data filed at 06/01/14 0500  Gross per 24 hour  Intake    130 ml  Output    600 ml  Net   -470 ml    CURRENT MEDICATIONS: . ferrous sulfate  325 mg Oral Daily  . Influenza vac split quadrivalent PF  0.5 mL Intramuscular Tomorrow-1000  . insulin aspart  0-9 Units Subcutaneous TID WC  . pantoprazole  40 mg Oral Daily  . sertraline  50 mg Oral QHS  . sodium chloride  3 mL Intravenous Q12H    LABS: Basic Metabolic Panel:  Recent Labs  05/31/14 1400 05/31/14 1422 05/31/14 1621 06/01/14 0440  NA 137 139  --  142  K 4.2 4.1  --  3.2*  CL 98 103  --  105  CO2 21  --   --  23  GLUCOSE 185* 187*  --  118*  BUN 16 17  --  14  CREATININE 1.62* 1.70*  --  1.04  CALCIUM 8.9  --   --  7.7*  MG  --   --  1.3*  --    Liver Function Tests:  Recent Labs  06/01/14 0440  AST 35  ALT 24  ALKPHOS 63  BILITOT 0.3  PROT 6.2  ALBUMIN 2.8*   CBC:  Recent Labs  05/31/14 1400 05/31/14 1422 06/01/14 0440  WBC 6.7  --  3.4*  NEUTROABS 4.5  --   --   HGB 10.4* 12.2* 9.2*  HCT 33.9* 36.0* 29.9*  MCV 95.2  --  95.8  PLT 227  --  132*   Cardiac Enzymes:  Recent Labs  05/31/14 1621 05/31/14 2210 06/01/14 0440  TROPONINI <0.30 <0.30 <0.30   Fasting Lipid Panel:  Recent Labs  06/01/14 0440  CHOL 111  HDL 31*    LDLCALC 48  TRIG 159*  CHOLHDL 3.6   Thyroid Function Tests:  Recent Labs  05/31/14 1621  TSH 3.510    Radiology/Studies:  Ct Head Wo Contrast 05/31/2014   CLINICAL DATA:  80 year old male with recent history of syncope complicated by a trauma after falling in striking head on pavement.  EXAM: CT HEAD WITHOUT CONTRAST  TECHNIQUE: Contiguous axial images were obtained from the base of the skull through the vertex without intravenous contrast.  COMPARISON:  Head CT 05/05/2013.  FINDINGS: Mild cerebral and cerebellar atrophy. Patchy and confluent areas of decreased attenuation are noted throughout the deep and periventricular white matter of the cerebral hemispheres bilaterally, compatible with chronic microvascular ischemic disease. No acute displaced skull fractures are identified. No acute intracranial abnormality. Specifically, no evidence of acute post-traumatic intracranial hemorrhage, no definite regions of acute/subacute cerebral ischemia, no focal mass, mass effect, hydrocephalus or abnormal intra or extra-axial fluid  collections. The visualized paranasal sinuses and mastoids are well pneumatized, although the left maxillary sinus again appears hypoplastic.  IMPRESSION: 1. No acute intracranial abnormalities. No signs of significant acute intracranial trauma. 2. Mild cerebral and cerebellar atrophy with extensive chronic microvascular ischemic changes throughout cerebral white matter redemonstrated.   Electronically Signed   By: Vinnie Langton M.D.   On: 05/31/2014 18:01   Dg Chest Port 1 View 05/31/2014   CLINICAL DATA:  Initial encounter for fatigue with dizziness lightheadedness today  EXAM: PORTABLE CHEST - 1 VIEW  COMPARISON:  03/30/2014  FINDINGS: 1433 hrs. Lordotic positioning foreshortened this the hemi thorax. No focal airspace consolidation or overt pulmonary edema. The cardio pericardial silhouette is enlarged. Imaged bony structures of the thorax are intact. Telemetry leads overlie  the chest.  IMPRESSION: Lordotic film without acute cardiopulmonary findings.   Electronically Signed   By: Misty Stanley M.D.   On: 05/31/2014 15:07   PHYSICAL EXAM elderly appearing 80 yo man, NAD HEENT: Unremarkable,[place], AT Neck:  6 JVD, no thyromegally Back:  No CVA tenderness Lungs:  Clear with no wheezes, rales, or rhonchi HEART:  Regular rate rhythm, no murmurs, no rubs, no clicks Abd:  soft, positive bowel sounds, no organomegally, no rebound, no guarding Ext:  2 plus pulses, no edema, no cyanosis, no clubbing, indwelling temporary pacing lead in right groin Skin:  No rashes no nodules Neuro:  CN II through XII intact, motor grossly intact    Active Problems:   HTN (hypertension)   Diabetes mellitus   Atrial fibrillation   Bradycardia with less than 30 beats per minute   Complete heart block   Syncope   Lactic acidosis   AKI (acute kidney injury)   Anemia  LBBB Rec: His CHB has resolved with discontinuation of metoprolol. We will observe over the next 24 hours. If he requires any pacing (indicating his conduction will continue to have CHB, we will place PPM). If no pacing or evidence of Mobitz 2, AV block, will undergo watchful waiting. Anticipate discharge on Saturday, with or without PPM. Await 2D echo.  Mikle Bosworth.D.

## 2014-06-01 NOTE — Progress Notes (Signed)
  Echocardiogram 2D Echocardiogram has been performed.  Tanda Morrissey 06/01/2014, 10:02 AM

## 2014-06-01 NOTE — Progress Notes (Signed)
Placed patient on CPAP via FFM, 10.0 cm H20 per pt. Home settings.  Patient tolerating well at this time.

## 2014-06-02 ENCOUNTER — Encounter (HOSPITAL_COMMUNITY)
Admission: EM | Disposition: A | Discharge status: Home / Self Care | Payer: Self-pay | Source: Home / Self Care | Attending: Interventional Cardiology

## 2014-06-02 LAB — GLUCOSE, CAPILLARY
GLUCOSE-CAPILLARY: 157 mg/dL — AB (ref 70–99)
GLUCOSE-CAPILLARY: 151 mg/dL — AB (ref 70–99)
Glucose-Capillary: 149 mg/dL — ABNORMAL HIGH (ref 70–99)
Glucose-Capillary: 135 mg/dL — ABNORMAL HIGH (ref 70–99)

## 2014-06-02 LAB — BASIC METABOLIC PANEL WITH GFR
Anion gap: 14 (ref 5–15)
BUN: 11 mg/dL (ref 6–23)
CO2: 26 meq/L (ref 19–32)
Calcium: 8 mg/dL — ABNORMAL LOW (ref 8.4–10.5)
Chloride: 103 meq/L (ref 96–112)
Creatinine, Ser: 0.97 mg/dL (ref 0.50–1.35)
GFR calc Af Amer: 87 mL/min — ABNORMAL LOW
GFR calc non Af Amer: 75 mL/min — ABNORMAL LOW
Glucose, Bld: 148 mg/dL — ABNORMAL HIGH (ref 70–99)
Potassium: 3.9 meq/L (ref 3.7–5.3)
Sodium: 143 meq/L (ref 137–147)

## 2014-06-02 LAB — MAGNESIUM: Magnesium: 1.5 mg/dL (ref 1.5–2.5)

## 2014-06-02 SURGERY — PERMANENT PACEMAKER INSERTION
Anesthesia: LOCAL

## 2014-06-02 MED ORDER — MAGNESIUM SULFATE 40 MG/ML IJ SOLN
2.0000 g | Freq: Once | INTRAMUSCULAR | Status: AC
Start: 2014-06-02 — End: 2014-06-02
  Administered 2014-06-02: 2 g via INTRAVENOUS
  Filled 2014-06-02: qty 50

## 2014-06-02 NOTE — H&P (Signed)
Mr. Witucki is followed by Dr. Acie Fredrickson. He has a history of atrial fibrillation and is on beta blocker and amiodarone therapy. She has an underlying left bundle branch block. Today while shopping at [facility], he had a syncopal episode. He was found to have profound bradycardia. There is also history that he may have his head when he fainted. In the emergency room. In the emergency room he had a heart rate of 30 beats per minute with a narrow complex escape rhythm and background sinus rhythm but complete heart block. External pacing was started. Eventually the pacing device was stopped and the patient had high-grade AV block with P waves present but no ventricular conduction. He had transient seizure-like activity until external pacing was resumed. In the cath lab the patient is arousable. He denies chest discomfort. He denies dyspnea.  He underwent emergency transvenous temporary pacemaker insertion via the right femoral without complication.  Examination after the procedure demonstrated that all pulses were equal. His skin color was normal. No carotid bruits are heard. Lungs are clear anteriorly. Cardiac exam reveals distant heart sounds but no obvious murmur or rub.  Abdomen is soft. There is no tenderness. Extremities reveal no edema. Neurologically the patient is sluggish but appropriately answers questions. No focal deficit is obvious that this early stage.  EKG done in the emergency room demonstrated complete heart block with a narrow complex escape rhythm at 30 beats per minute. There are marked repolarization changes noted.  IMPRESSION: Profound bradycardia due to high-grade/complete AV block with a narrow complex ventricular escape rhythm at 30 beats per minute. In absence of chest pain I doubt that the bradycardia is related to ischemia.  History of atrial fibrillation  Diabetes mellitus, complicated  Hypertension, essential  Obstructive sleep apnea  PLAN:  Discontinue amiodarone  Discontinue  metoprolol  Hold Xarelto in any form of anticoagulation until CT of the head is performed. Will resume at the very least DVT prophylaxis once cleared.  Hold amlodipine  Serial markers to rule out myocardial infarction.  Temporary pacing to allow an opportunity for recovery of conduction versus decision about permanent pacing.  CT scan without contrast to rule out intracranial bleed related to trauma and anticoagulation  2-D Doppler echocardiogram  Admit to coronary care unit  Check BNP, TSH, along with a comprehensive metabolic panel.

## 2014-06-02 NOTE — Progress Notes (Signed)
Pt ambulated from chair to bathroom to bed. HR 130-140s, BP 182/75 (111), RR 25, O2 92% RA. Pt became SOB, but denied dizziness/lightheadedness. Pt stated he felt nervous and shaky. Pt immediately returned to bed. HR 110 after a couple of minutes in bed. Will continue to monitor and assess pt.

## 2014-06-02 NOTE — Progress Notes (Signed)
Placed pt. On cpap. Pt. Is tolerating well at this time.  

## 2014-06-02 NOTE — Progress Notes (Signed)
Patient ambulated to restroom and back to bed; was very SOB and HR in 140's.  Will attempt ambulation in hall after he rests, if he is able to tolerate. [place], Ardeth Sportsman

## 2014-06-02 NOTE — Progress Notes (Signed)
Per verbal orders from Dr. Rayann Heman transvenous pacemaker removed with venous sheath. Pressure held for approximately 15 minutes. Vital Signs remained stable during entire procedure. Sheath site level zero. Will continue to monitor and assess patient.

## 2014-06-03 ENCOUNTER — Encounter (HOSPITAL_COMMUNITY)
Admission: EM | Disposition: A | Discharge status: Home / Self Care | Payer: Medicare Other | Source: Home / Self Care | Attending: Interventional Cardiology

## 2014-06-03 DIAGNOSIS — I442 Atrioventricular block, complete: Secondary | ICD-10-CM

## 2014-06-03 DIAGNOSIS — I480 Paroxysmal atrial fibrillation: Secondary | ICD-10-CM

## 2014-06-03 DIAGNOSIS — R5383 Other fatigue: Secondary | ICD-10-CM

## 2014-06-03 HISTORY — PX: PERMANENT PACEMAKER INSERTION: SHX5480

## 2014-06-03 LAB — GLUCOSE, CAPILLARY
GLUCOSE-CAPILLARY: 133 mg/dL — AB (ref 70–99)
GLUCOSE-CAPILLARY: 192 mg/dL — AB (ref 70–99)
Glucose-Capillary: 147 mg/dL — ABNORMAL HIGH (ref 70–99)
Glucose-Capillary: 189 mg/dL — ABNORMAL HIGH (ref 70–99)

## 2014-06-03 LAB — BASIC METABOLIC PANEL
ANION GAP: 16 — AB (ref 5–15)
BUN: 12 mg/dL (ref 6–23)
CO2: 25 mEq/L (ref 19–32)
CREATININE: 1.16 mg/dL (ref 0.50–1.35)
Calcium: 8.8 mg/dL (ref 8.4–10.5)
Chloride: 99 mEq/L (ref 96–112)
GFR, EST AFRICAN AMERICAN: 66 mL/min — AB (ref >90)
GFR, EST NON AFRICAN AMERICAN: 57 mL/min — AB (ref >90)
Glucose, Bld: 164 mg/dL — ABNORMAL HIGH (ref 70–99)
Potassium: 4.1 mEq/L (ref 3.7–5.3)
Sodium: 140 mEq/L (ref 137–147)

## 2014-06-03 LAB — CBC
HCT: 33.7 % — ABNORMAL LOW (ref 39.0–52.0)
Hemoglobin: 10.8 g/dL — ABNORMAL LOW (ref 13.0–17.0)
MCH: 29.8 pg (ref 26.0–34.0)
MCHC: 32 g/dL (ref 30.0–36.0)
MCV: 93.1 fL (ref 78.0–100.0)
PLATELETS: 210 10*3/uL (ref 150–400)
RBC: 3.62 MIL/uL — ABNORMAL LOW (ref 4.22–5.81)
RDW: 21.5 % — AB (ref 11.5–15.5)
WBC: 5.1 10*3/uL (ref 4.0–10.5)

## 2014-06-03 LAB — MAGNESIUM: Magnesium: 1.8 mg/dL (ref 1.5–2.5)

## 2014-06-03 SURGERY — PERMANENT PACEMAKER INSERTION
Anesthesia: LOCAL

## 2014-06-03 MED ORDER — SODIUM CHLORIDE 0.9 % IJ SOLN: 3.0000 mL | INTRAMUSCULAR | Status: DC | PRN

## 2014-06-03 MED ORDER — LIDOCAINE HCL (PF) 1 % IJ SOLN
INTRAMUSCULAR | Status: AC
  Filled 2014-06-03: qty 30

## 2014-06-03 MED ORDER — METOPROLOL TARTRATE 50 MG PO TABS
50.0000 mg | ORAL_TABLET | Freq: Two times a day (BID) | ORAL | Status: DC
  Administered 2014-06-03 – 2014-06-04 (×3): 50 mg via ORAL
  Filled 2014-06-03 (×4): qty 1

## 2014-06-03 MED ORDER — ONDANSETRON HCL 4 MG/2ML IJ SOLN: 4.0000 mg | Freq: Four times a day (QID) | INTRAMUSCULAR | Status: DC | PRN

## 2014-06-03 MED ORDER — SODIUM CHLORIDE 0.9 % IR SOLN
80.0000 mg | Status: DC
  Filled 2014-06-03: qty 2

## 2014-06-03 MED ORDER — SODIUM CHLORIDE 0.9 % IV SOLN: 250.0000 mL | INTRAVENOUS | Status: DC | PRN

## 2014-06-03 MED ORDER — SODIUM CHLORIDE 0.45 % IV SOLN
INTRAVENOUS | Status: DC
  Administered 2014-06-03: 09:00:00 via INTRAVENOUS

## 2014-06-03 MED ORDER — HEPARIN (PORCINE) IN NACL 2-0.9 UNIT/ML-% IJ SOLN
INTRAMUSCULAR | Status: AC
  Filled 2014-06-03: qty 500

## 2014-06-03 MED ORDER — ATROPINE SULFATE 0.1 MG/ML IJ SOLN
INTRAMUSCULAR | Status: AC
  Filled 2014-06-03: qty 10

## 2014-06-03 MED ORDER — CEFAZOLIN SODIUM-DEXTROSE 2-3 GM-% IV SOLR
2.0000 g | INTRAVENOUS | Status: DC
  Filled 2014-06-03: qty 50

## 2014-06-03 MED ORDER — SODIUM CHLORIDE 0.9 % IV SOLN
INTRAVENOUS | Status: AC
  Administered 2014-06-03: 19:00:00 via INTRAVENOUS

## 2014-06-03 MED ORDER — HYDROCODONE-ACETAMINOPHEN 5-325 MG PO TABS: 1.0000 | ORAL_TABLET | ORAL | Status: DC | PRN

## 2014-06-03 MED ORDER — SODIUM CHLORIDE 0.9 % IV SOLN
INTRAVENOUS | Status: DC
  Administered 2014-06-03: 09:00:00 via INTRAVENOUS

## 2014-06-03 MED ORDER — CHLORHEXIDINE GLUCONATE 4 % EX LIQD
CUTANEOUS | Status: AC
  Filled 2014-06-03: qty 15

## 2014-06-03 MED ORDER — SODIUM CHLORIDE 0.9 % IJ SOLN: 3.0000 mL | Freq: Two times a day (BID) | INTRAMUSCULAR | Status: DC

## 2014-06-03 MED ORDER — ACETAMINOPHEN 325 MG PO TABS
325.0000 mg | ORAL_TABLET | ORAL | Status: DC | PRN
  Administered 2014-06-03: 650 mg via ORAL
  Filled 2014-06-03: qty 2

## 2014-06-03 MED ORDER — CHLORHEXIDINE GLUCONATE 4 % EX LIQD
60.0000 mL | Freq: Once | CUTANEOUS | Status: AC
  Administered 2014-06-03: 4 via TOPICAL

## 2014-06-03 MED ORDER — AMIODARONE HCL 100 MG PO TABS
100.0000 mg | ORAL_TABLET | Freq: Every day | ORAL | Status: DC
  Administered 2014-06-03: 100 mg via ORAL
  Filled 2014-06-03 (×2): qty 1

## 2014-06-03 MED ORDER — CEFAZOLIN SODIUM 1-5 GM-% IV SOLN
1.0000 g | Freq: Four times a day (QID) | INTRAVENOUS | Status: AC
  Administered 2014-06-03 – 2014-06-04 (×3): 1 g via INTRAVENOUS
  Filled 2014-06-03 (×5): qty 50

## 2014-06-03 NOTE — H&P (View-Only) (Signed)
SUBJECTIVE: The patient is doing well today.  At this time, he denies chest pain, shortness of breath, or any new concerns.  He had prolonged complete heart block again this am with symptoms of fatigue and dizziness.  Marland Kitchen atropine      . ferrous sulfate  325 mg Oral Daily  . insulin aspart  0-9 Units Subcutaneous TID WC  . losartan  50 mg Oral Daily  . pantoprazole  40 mg Oral Daily  . sertraline  50 mg Oral QHS  . sodium chloride  3 mL Intravenous Q12H      OBJECTIVE: Physical Exam: Filed Vitals:   06/03/14 0630 06/03/14 0700 06/03/14 0730 06/03/14 0800  BP: 145/74 153/100 153/76 160/87  Pulse: 100 97 101 103  Temp:    98 F (36.7 C)  TempSrc:      Resp: 20 25 13 23   Height:      Weight:      SpO2: 94% 91% 94% 93%    Intake/Output Summary (Last 24 hours) at 06/03/14 0838 Last data filed at 06/02/14 2100  Gross per 24 hour  Intake    380 ml  Output    250 ml  Net    130 ml    Telemetry reveals sinus tachycardia, complete heart block intermittently this am while awake with symptoms  GEN- The patient is elderly appearing, alert and oriented x 3 today.   Head- normocephalic, atraumatic Eyes-  Sclera clear, conjunctiva pink Ears- hearing intact Oropharynx- clear Neck- supple,  Lungs- Clear to ausculation bilaterally, normal work of breathing Heart- bradycardic regular rhythm, no murmurs  GI- soft, NT, ND, + BS Extremities- no clubbing, cyanosis, + dependant edema Skin- no rash or lesion Psych- euthymic mood, full affect Neuro- strength and sensation are intact  LABS: Basic Metabolic Panel:  Recent Labs  06/01/14 0440 06/01/14 0500 06/02/14 0410  NA 142  --  143  K 3.2*  --  3.9  CL 105  --  103  CO2 23  --  26  GLUCOSE 118*  --  148*  BUN 14  --  11  CREATININE 1.04  --  0.97  CALCIUM 7.7*  --  8.0*  MG  --  1.2* 1.5   Liver Function Tests:  Recent Labs  06/01/14 0440  AST 35  ALT 24  ALKPHOS 63  BILITOT 0.3  PROT 6.2  ALBUMIN 2.8*   No  results found for this basename: LIPASE, AMYLASE,  in the last 72 hours CBC:  Recent Labs  05/31/14 1400 05/31/14 1422 06/01/14 0440  WBC 6.7  --  3.4*  NEUTROABS 4.5  --   --   HGB 10.4* 12.2* 9.2*  HCT 33.9* 36.0* 29.9*  MCV 95.2  --  95.8  PLT 227  --  132*   Cardiac Enzymes:  Recent Labs  05/31/14 1621 05/31/14 2210 06/01/14 0440  TROPONINI <0.30 <0.30 <0.30   Fasting Lipid Panel:  Recent Labs  06/01/14 0440  CHOL 111  HDL 31*  LDLCALC 48  TRIG 159*  CHOLHDL 3.6   Thyroid Function Tests:  Recent Labs  05/31/14 1621  TSH 3.510   Anemia Panel: No results found for this basename: VITAMINB12, FOLATE, FERRITIN, TIBC, IRON, RETICCTPCT,  in the last 72 hours  RADIOLOGY: Ct Head Wo Contrast  05/31/2014   CLINICAL DATA:  80 year old male with recent history of syncope complicated by a trauma after falling in striking head on pavement.  EXAM: CT HEAD WITHOUT CONTRAST  TECHNIQUE: Contiguous axial images were obtained from the base of the skull through the vertex without intravenous contrast.  COMPARISON:  Head CT 05/05/2013.  FINDINGS: Mild cerebral and cerebellar atrophy. Patchy and confluent areas of decreased attenuation are noted throughout the deep and periventricular white matter of the cerebral hemispheres bilaterally, compatible with chronic microvascular ischemic disease. No acute displaced skull fractures are identified. No acute intracranial abnormality. Specifically, no evidence of acute post-traumatic intracranial hemorrhage, no definite regions of acute/subacute cerebral ischemia, no focal mass, mass effect, hydrocephalus or abnormal intra or extra-axial fluid collections. The visualized paranasal sinuses and mastoids are well pneumatized, although the left maxillary sinus again appears hypoplastic.  IMPRESSION: 1. No acute intracranial abnormalities. No signs of significant acute intracranial trauma. 2. Mild cerebral and cerebellar atrophy with extensive chronic  microvascular ischemic changes throughout cerebral white matter redemonstrated.   Electronically Signed   By: Vinnie Langton M.D.   On: 05/31/2014 18:01   Dg Chest Port 1 View  05/31/2014   CLINICAL DATA:  Initial encounter for fatigue with dizziness lightheadedness today  EXAM: PORTABLE CHEST - 1 VIEW  COMPARISON:  03/30/2014  FINDINGS: 1433 hrs. Lordotic positioning foreshortened this the hemi thorax. No focal airspace consolidation or overt pulmonary edema. The cardio pericardial silhouette is enlarged. Imaged bony structures of the thorax are intact. Telemetry leads overlie the chest.  IMPRESSION: Lordotic film without acute cardiopulmonary findings.   Electronically Signed   By: Misty Stanley M.D.   On: 05/31/2014 15:07    ASSESSMENT AND PLAN:  Active Problems:   HTN (hypertension)   Diabetes mellitus   Atrial fibrillation   Bradycardia with less than 30 beats per minute   Complete heart block   Syncope   Lactic acidosis   AKI (acute kidney injury)   Anemia 1.  Intermittent complete heart block The patient continues to have AV block despite medicine washout for several days.  Upon getting up to go to the bathroom, he has developed recurrent AV block with symptoms of fatigue and dizziness. The patient has symptomatic AV block.  I would therefore recommend pacemaker implantation at this time.  Risks, benefits, alternatives to pacemaker implantation were discussed in detail with the patient today. The patient understands that the risks include but are not limited to bleeding, infection, pneumothorax, perforation, tamponade, vascular damage, renal failure, MI, stroke, death,  and lead dislodgement and wishes to proceed. We will therefore schedule the procedure at the next available time.  I have called our Cath lab supervisor to see if we have a team available to perform the case this weekend.  I will make NPO for now.  2. Sinus tachycardia Unclear etiology Possibly due to metoprolol  washout Check bmet/ mg this am  3. Hypomagnesemia Repeat bmet/ mg today  4. HTN Would not aggressively control until PPM is in place  5. AFib chads2vasc score is at least 4.  Not presently anticoagulated Will need to be addressed prior to discharge.  Given concerns of anemia, may not be a candidate for anticoagulation presently Thompson Grayer, MD 06/03/2014 8:38 AM

## 2014-06-03 NOTE — Progress Notes (Signed)
Patient ID: Richard [name], male   DOB: 07-Oct-1930, 80 y.o.   MRN: [id number]   SUBJECTIVE: Patient had episodes of tachycardia to 140s yesterday.  I cannot find the telemetry for this but he did have what appeared to be sinus tachycardia in the 110s at times.  This morning, he had episode of CHB with LBBB escape rate 40s.  He has not had lightheadedness/syncope again.   Scheduled Meds: . atropine      . ferrous sulfate  325 mg Oral Daily  . insulin aspart  0-9 Units Subcutaneous TID WC  . losartan  50 mg Oral Daily  . pantoprazole  40 mg Oral Daily  . sertraline  50 mg Oral QHS  . sodium chloride  3 mL Intravenous Q12H   Continuous Infusions:  PRN Meds:.sodium chloride, acetaminophen, diazepam, fentaNYL, hydrALAZINE, sodium chloride    Filed Vitals:   06/03/14 0630 06/03/14 0700 06/03/14 0730 06/03/14 0800  BP: 145/74 153/100 153/76 160/87  Pulse: 100 97 101 103  Temp:      TempSrc:      Resp: 20 25 13 23   Height:      Weight:      SpO2: 94% 91% 94% 93%    Intake/Output Summary (Last 24 hours) at 06/03/14 0822 Last data filed at 06/02/14 2100  Gross per 24 hour  Intake    380 ml  Output    250 ml  Net    130 ml    LABS: Basic Metabolic Panel:  Recent Labs  06/01/14 0440 06/01/14 0500 06/02/14 0410  NA 142  --  143  K 3.2*  --  3.9  CL 105  --  103  CO2 23  --  26  GLUCOSE 118*  --  148*  BUN 14  --  11  CREATININE 1.04  --  0.97  CALCIUM 7.7*  --  8.0*  MG  --  1.2* 1.5   Liver Function Tests:  Recent Labs  06/01/14 0440  AST 35  ALT 24  ALKPHOS 63  BILITOT 0.3  PROT 6.2  ALBUMIN 2.8*   No results found for this basename: LIPASE, AMYLASE,  in the last 72 hours CBC:  Recent Labs  05/31/14 1400 05/31/14 1422 06/01/14 0440  WBC 6.7  --  3.4*  NEUTROABS 4.5  --   --   HGB 10.4* 12.2* 9.2*  HCT 33.9* 36.0* 29.9*  MCV 95.2  --  95.8  PLT 227  --  132*   Cardiac Enzymes:  Recent Labs  05/31/14 1621 05/31/14 2210 06/01/14 0440    TROPONINI <0.30 <0.30 <0.30   BNP: No components found with this basename: POCBNP,  D-Dimer: No results found for this basename: DDIMER,  in the last 72 hours Hemoglobin A1C: No results found for this basename: HGBA1C,  in the last 72 hours Fasting Lipid Panel:  Recent Labs  06/01/14 0440  CHOL 111  HDL 31*  LDLCALC 48  TRIG 159*  CHOLHDL 3.6   Thyroid Function Tests:  Recent Labs  05/31/14 1621  TSH 3.510   Anemia Panel: No results found for this basename: VITAMINB12, FOLATE, FERRITIN, TIBC, IRON, RETICCTPCT,  in the last 72 hours  RADIOLOGY: Ct Head Wo Contrast  05/31/2014   CLINICAL DATA:  80 year old male with recent history of syncope complicated by a trauma after falling in striking head on pavement.  EXAM: CT HEAD WITHOUT CONTRAST  TECHNIQUE: Contiguous axial images were obtained from the base of the skull through  the vertex without intravenous contrast.  COMPARISON:  Head CT 05/05/2013.  FINDINGS: Mild cerebral and cerebellar atrophy. Patchy and confluent areas of decreased attenuation are noted throughout the deep and periventricular white matter of the cerebral hemispheres bilaterally, compatible with chronic microvascular ischemic disease. No acute displaced skull fractures are identified. No acute intracranial abnormality. Specifically, no evidence of acute post-traumatic intracranial hemorrhage, no definite regions of acute/subacute cerebral ischemia, no focal mass, mass effect, hydrocephalus or abnormal intra or extra-axial fluid collections. The visualized paranasal sinuses and mastoids are well pneumatized, although the left maxillary sinus again appears hypoplastic.  IMPRESSION: 1. No acute intracranial abnormalities. No signs of significant acute intracranial trauma. 2. Mild cerebral and cerebellar atrophy with extensive chronic microvascular ischemic changes throughout cerebral white matter redemonstrated.   Electronically Signed   By: Vinnie Langton M.D.   On:  05/31/2014 18:01   Dg Chest Port 1 View  05/31/2014   CLINICAL DATA:  Initial encounter for fatigue with dizziness lightheadedness today  EXAM: PORTABLE CHEST - 1 VIEW  COMPARISON:  03/30/2014  FINDINGS: 1433 hrs. Lordotic positioning foreshortened this the hemi thorax. No focal airspace consolidation or overt pulmonary edema. The cardio pericardial silhouette is enlarged. Imaged bony structures of the thorax are intact. Telemetry leads overlie the chest.  IMPRESSION: Lordotic film without acute cardiopulmonary findings.   Electronically Signed   By: Misty Stanley M.D.   On: 05/31/2014 15:07    PHYSICAL EXAM General: NAD Neck: No JVD, no thyromegaly or thyroid nodule.  Lungs: Clear to auscultation bilaterally with normal respiratory effort. CV: Nondisplaced PMI.  Heart regular S1/S2, no S3/S4, no murmur.  1+ ankle edema.   Abdomen: Soft, nontender, no hepatosplenomegaly, no distention.  Neurologic: Alert and oriented x 3.  Psych: Normal affect. Extremities: No clubbing or cyanosis.   TELEMETRY: Reviewed telemetry pt in NSR currently but had episode of CHB with HR around 40  ASSESSMENT AND PLAN: 80 yo with prior h/o atrial fibrillation/flutter was admitted with syncope and CHB.   1. Syncope: Likely due to complete heart block.  Metoprolol and amiodarone were stopped and he was observed.  Last night, he had CHB again with HR around 40 (LBBB escape) off nodal blockers.  He was asymptomatic this time.  Temporary transvenous pacemaker is out.  He will need a pacemaker.  Dr. Rayann Heman to see this morning and determine timing.  EF normal on echo.  2. H/o atrial fibrillation/flutter: He is not anticoagulated at this time.  He had high rate yesterday in the 140s, I cannot find the telemetry but ?atrial arrhythmia.  He cannot get nodal blockade until after PCM placement.  Would start him back on metoprolol and amiodarone after PCM.  Will need to consider anticoagulation also going forward.   Loralie Champagne 06/03/2014 8:27 AM

## 2014-06-03 NOTE — Progress Notes (Signed)
Patient and wife have watched the pacemaker video.

## 2014-06-03 NOTE — Interval H&P Note (Signed)
History and Physical Interval Note:  06/03/2014 11:54 AM  Richard [name]  has presented today for surgery, with the diagnosis of Bradycardia/ heart block  The various methods of treatment have been discussed with the patient and family. After consideration of risks, benefits and other options for treatment, the patient has consented to  Procedure(s): PERMANENT PACEMAKER INSERTION (N/A) as a surgical intervention .  The patient's history has been reviewed, patient examined, no change in status, stable for surgery.  I have reviewed the patient's chart and labs.  Questions were answered to the patient's satisfaction.     Thompson Grayer

## 2014-06-03 NOTE — Progress Notes (Signed)
While in bed asleep pt HR dropped to 30-40s. RN to bedside to assess pt. Pt woken up, denied dizziness and lightheadedness. BP remained stable. EKG done showing SB w/ L BBB. Cardiology fellow, Dr Kennith Center, notified. No new orders given. Will continue to monitor and assess pt's condition.

## 2014-06-03 NOTE — Op Note (Signed)
SURGEON:  Thompson Grayer, MD     PREPROCEDURE DIAGNOSIS:  Symptomatic intermittent complete heart block    POSTPROCEDURE DIAGNOSIS:  Symptomatic intermittent complete heart block     PROCEDURES:   1. Left upper extremity venography.   2. Pacemaker implantation.     INTRODUCTION: Richard [name] is a 80 y.o. male  with a history of intermittent complete heart block who presents today for pacemaker implantation.  The patient reports intermittent episodes of dizziness and weakness over the past few weeks.  No reversible causes have been identified.  The patient therefore presents today for pacemaker implantation.     DESCRIPTION OF PROCEDURE:  Informed written consent was obtained, and the patient was brought to the electrophysiology lab in a fasting state.  The patient required no sedation for the procedure today.  The patients left chest was prepped and draped in the usual sterile fashion by the EP lab staff. The skin overlying the left deltopectoral region was infiltrated with lidocaine for local analgesia.  A 4-cm incision was made over the left deltopectoral region.  A left subcutaneous pacemaker pocket was fashioned using a combination of sharp and blunt dissection. Electrocautery was required to assure hemostasis.    Left Upper Extremity Venography: A venogram of the left upper extremity was performed, which revealed a large left cephalic vein, which emptied into a large left subclavian vein.  The left axillary vein was small to moderate in size and had a cephalad location.    RA/RV Lead Placement: The left axillary vein was visualized and cannulated.  Through the left axillary vein, a Medtronic model [id number] (serial number [id number]) right atrial lead and a Medtronic model 6789- 58 (serial number [id number]) right ventricular lead were advanced with fluoroscopic visualization into the right atrial appendage and right ventricular apex positions respectively.  Initial atrial lead P- waves  measured 1.4-2 mV with impedance of 734 ohms and a threshold of 1.6 V at 0.5 msec.  Right ventricular lead R-waves measured 18 mV with an impedance of 531 ohms and a threshold of 0.5 V at 0.5 msec.  Both leads were secured to the pectoralis fascia using #2-0 silk over the suture sleeves.   Device Placement:  The leads were then connected to a Medtronic Adapta L model ADDRL 1 (serial number [id number]) pacemaker.  The pocket was irrigated with copious gentamicin solution.  The pacemaker was then placed into the pocket.  The pocket was then closed in 2 layers with 2.0 Vicryl suture for the subcutaneous and subcuticular layers.  Steri- Strips and a sterile dressing were then applied. EBL<59ml. There were no early apparent complications.     CONCLUSIONS:   1. Successful implantation of a Medtronic Adapta L dual-chamber pacemaker for symptomatic intermittent complete heart block  2. No early apparent complications        Thompson Grayer, MD 06/03/2014 12:57 PM

## 2014-06-03 NOTE — Progress Notes (Signed)
SUBJECTIVE: The patient is doing well today.  At this time, he denies chest pain, shortness of breath, or any new concerns.  He had prolonged complete heart block again this am with symptoms of fatigue and dizziness.  Marland Kitchen atropine      . ferrous sulfate  325 mg Oral Daily  . insulin aspart  0-9 Units Subcutaneous TID WC  . losartan  50 mg Oral Daily  . pantoprazole  40 mg Oral Daily  . sertraline  50 mg Oral QHS  . sodium chloride  3 mL Intravenous Q12H      OBJECTIVE: Physical Exam: Filed Vitals:   06/03/14 0630 06/03/14 0700 06/03/14 0730 06/03/14 0800  BP: 145/74 153/100 153/76 160/87  Pulse: 100 97 101 103  Temp:    98 F (36.7 C)  TempSrc:      Resp: 20 25 13 23   Height:      Weight:      SpO2: 94% 91% 94% 93%    Intake/Output Summary (Last 24 hours) at 06/03/14 0838 Last data filed at 06/02/14 2100  Gross per 24 hour  Intake    380 ml  Output    250 ml  Net    130 ml    Telemetry reveals sinus tachycardia, complete heart block intermittently this am while awake with symptoms  GEN- The patient is elderly appearing, alert and oriented x 3 today.   Head- normocephalic, atraumatic Eyes-  Sclera clear, conjunctiva pink Ears- hearing intact Oropharynx- clear Neck- supple,  Lungs- Clear to ausculation bilaterally, normal work of breathing Heart- bradycardic regular rhythm, no murmurs  GI- soft, NT, ND, + BS Extremities- no clubbing, cyanosis, + dependant edema Skin- no rash or lesion Psych- euthymic mood, full affect Neuro- strength and sensation are intact  LABS: Basic Metabolic Panel:  Recent Labs  06/01/14 0440 06/01/14 0500 06/02/14 0410  NA 142  --  143  K 3.2*  --  3.9  CL 105  --  103  CO2 23  --  26  GLUCOSE 118*  --  148*  BUN 14  --  11  CREATININE 1.04  --  0.97  CALCIUM 7.7*  --  8.0*  MG  --  1.2* 1.5   Liver Function Tests:  Recent Labs  06/01/14 0440  AST 35  ALT 24  ALKPHOS 63  BILITOT 0.3  PROT 6.2  ALBUMIN 2.8*   No  results found for this basename: LIPASE, AMYLASE,  in the last 72 hours CBC:  Recent Labs  05/31/14 1400 05/31/14 1422 06/01/14 0440  WBC 6.7  --  3.4*  NEUTROABS 4.5  --   --   HGB 10.4* 12.2* 9.2*  HCT 33.9* 36.0* 29.9*  MCV 95.2  --  95.8  PLT 227  --  132*   Cardiac Enzymes:  Recent Labs  05/31/14 1621 05/31/14 2210 06/01/14 0440  TROPONINI <0.30 <0.30 <0.30   Fasting Lipid Panel:  Recent Labs  06/01/14 0440  CHOL 111  HDL 31*  LDLCALC 48  TRIG 159*  CHOLHDL 3.6   Thyroid Function Tests:  Recent Labs  05/31/14 1621  TSH 3.510   Anemia Panel: No results found for this basename: VITAMINB12, FOLATE, FERRITIN, TIBC, IRON, RETICCTPCT,  in the last 72 hours  RADIOLOGY: Ct Head Wo Contrast  05/31/2014   CLINICAL DATA:  80 year old male with recent history of syncope complicated by a trauma after falling in striking head on pavement.  EXAM: CT HEAD WITHOUT CONTRAST  TECHNIQUE: Contiguous axial images were obtained from the base of the skull through the vertex without intravenous contrast.  COMPARISON:  Head CT 05/05/2013.  FINDINGS: Mild cerebral and cerebellar atrophy. Patchy and confluent areas of decreased attenuation are noted throughout the deep and periventricular white matter of the cerebral hemispheres bilaterally, compatible with chronic microvascular ischemic disease. No acute displaced skull fractures are identified. No acute intracranial abnormality. Specifically, no evidence of acute post-traumatic intracranial hemorrhage, no definite regions of acute/subacute cerebral ischemia, no focal mass, mass effect, hydrocephalus or abnormal intra or extra-axial fluid collections. The visualized paranasal sinuses and mastoids are well pneumatized, although the left maxillary sinus again appears hypoplastic.  IMPRESSION: 1. No acute intracranial abnormalities. No signs of significant acute intracranial trauma. 2. Mild cerebral and cerebellar atrophy with extensive chronic  microvascular ischemic changes throughout cerebral white matter redemonstrated.   Electronically Signed   By: Vinnie Langton M.D.   On: 05/31/2014 18:01   Dg Chest Port 1 View  05/31/2014   CLINICAL DATA:  Initial encounter for fatigue with dizziness lightheadedness today  EXAM: PORTABLE CHEST - 1 VIEW  COMPARISON:  03/30/2014  FINDINGS: 1433 hrs. Lordotic positioning foreshortened this the hemi thorax. No focal airspace consolidation or overt pulmonary edema. The cardio pericardial silhouette is enlarged. Imaged bony structures of the thorax are intact. Telemetry leads overlie the chest.  IMPRESSION: Lordotic film without acute cardiopulmonary findings.   Electronically Signed   By: Misty Stanley M.D.   On: 05/31/2014 15:07    ASSESSMENT AND PLAN:  Active Problems:   HTN (hypertension)   Diabetes mellitus   Atrial fibrillation   Bradycardia with less than 30 beats per minute   Complete heart block   Syncope   Lactic acidosis   AKI (acute kidney injury)   Anemia 1.  Intermittent complete heart block The patient continues to have AV block despite medicine washout for several days.  Upon getting up to go to the bathroom, he has developed recurrent AV block with symptoms of fatigue and dizziness. The patient has symptomatic AV block.  I would therefore recommend pacemaker implantation at this time.  Risks, benefits, alternatives to pacemaker implantation were discussed in detail with the patient today. The patient understands that the risks include but are not limited to bleeding, infection, pneumothorax, perforation, tamponade, vascular damage, renal failure, MI, stroke, death,  and lead dislodgement and wishes to proceed. We will therefore schedule the procedure at the next available time.  I have called our Cath lab supervisor to see if we have a team available to perform the case this weekend.  I will make NPO for now.  2. Sinus tachycardia Unclear etiology Possibly due to metoprolol  washout Check bmet/ mg this am  3. Hypomagnesemia Repeat bmet/ mg today  4. HTN Would not aggressively control until PPM is in place  5. AFib chads2vasc score is at least 4.  Not presently anticoagulated Will need to be addressed prior to discharge.  Given concerns of anemia, may not be a candidate for anticoagulation presently Thompson Grayer, MD 06/03/2014 8:38 AM

## 2014-06-04 ENCOUNTER — Encounter (HOSPITAL_COMMUNITY): Payer: Self-pay | Admitting: Physician Assistant

## 2014-06-04 ENCOUNTER — Inpatient Hospital Stay (HOSPITAL_COMMUNITY): Payer: Medicare Other

## 2014-06-04 DIAGNOSIS — I951 Orthostatic hypotension: Secondary | ICD-10-CM | POA: Diagnosis present

## 2014-06-04 DIAGNOSIS — I119 Hypertensive heart disease without heart failure: Secondary | ICD-10-CM | POA: Diagnosis present

## 2014-06-04 DIAGNOSIS — K219 Gastro-esophageal reflux disease without esophagitis: Secondary | ICD-10-CM | POA: Diagnosis present

## 2014-06-04 DIAGNOSIS — D519 Vitamin B12 deficiency anemia, unspecified: Secondary | ICD-10-CM | POA: Diagnosis present

## 2014-06-04 LAB — BASIC METABOLIC PANEL
Anion gap: 13 (ref 5–15)
BUN: 14 mg/dL (ref 6–23)
CHLORIDE: 100 meq/L (ref 96–112)
CO2: 25 mEq/L (ref 19–32)
Calcium: 8.4 mg/dL (ref 8.4–10.5)
Creatinine, Ser: 1.28 mg/dL (ref 0.50–1.35)
GFR calc Af Amer: 59 mL/min — ABNORMAL LOW (ref >90)
GFR calc non Af Amer: 51 mL/min — ABNORMAL LOW (ref >90)
GLUCOSE: 153 mg/dL — AB (ref 70–99)
POTASSIUM: 3.9 meq/L (ref 3.7–5.3)
Sodium: 138 mEq/L (ref 137–147)

## 2014-06-04 LAB — CBC
HCT: 32.9 % — ABNORMAL LOW (ref 39.0–52.0)
HEMOGLOBIN: 10.1 g/dL — AB (ref 13.0–17.0)
MCH: 29.1 pg (ref 26.0–34.0)
MCHC: 30.7 g/dL (ref 30.0–36.0)
MCV: 94.8 fL (ref 78.0–100.0)
Platelets: 166 10*3/uL (ref 150–400)
RBC: 3.47 MIL/uL — AB (ref 4.22–5.81)
RDW: 21.2 % — ABNORMAL HIGH (ref 11.5–15.5)
WBC: 4.8 10*3/uL (ref 4.0–10.5)

## 2014-06-04 LAB — GLUCOSE, CAPILLARY: Glucose-Capillary: 167 mg/dL — ABNORMAL HIGH (ref 70–99)

## 2014-06-04 NOTE — Progress Notes (Signed)
Patient Name: Richard [name] Date of Encounter: 06/04/2014     Active Problems:   HTN (hypertension)   Diabetes mellitus   Atrial fibrillation   Bradycardia with less than 30 beats per minute   Complete heart block   Syncope   Lactic acidosis   AKI (acute kidney injury)   Anemia    SUBJECTIVE  Feeling ready to go home.  CURRENT MEDS . amiodarone  100 mg Oral Daily  . ferrous sulfate  325 mg Oral Daily  . insulin aspart  0-9 Units Subcutaneous TID WC  . losartan  50 mg Oral Daily  . metoprolol  50 mg Oral BID  . pantoprazole  40 mg Oral Daily  . sertraline  50 mg Oral QHS  . sodium chloride  3 mL Intravenous Q12H    OBJECTIVE  Filed Vitals:   06/03/14 1900 06/03/14 2026 06/03/14 2317 06/04/14 0440  BP: 149/78 134/61  157/73  Pulse: 91 76 78 73  Temp:  98.3 F (36.8 C)  98.5 F (36.9 C)  TempSrc:  Oral  Oral  Resp: 20 20 16 18   Height:  6\' 4"  (1.93 m)    Weight:  266 lb 11.2 oz (120.974 kg)  266 lb 4.6 oz (120.788 kg)  SpO2: 94% 96%  97%    Intake/Output Summary (Last 24 hours) at 06/04/14 0900 Last data filed at 06/04/14 0600  Gross per 24 hour  Intake    579 ml  Output    900 ml  Net   -321 ml   Filed Weights   06/03/14 0400 06/03/14 2026 06/04/14 0440  Weight: 260 lb 12.9 oz (118.3 kg) 266 lb 11.2 oz (120.974 kg) 266 lb 4.6 oz (120.788 kg)    PHYSICAL EXAM  General: Pleasant, NAD. Neuro: Alert and oriented X 3. Moves all extremities spontaneously. Psych: Normal affect. HEENT:  Normal  Neck: Supple without bruits or JVD. Lungs:  Resp regular and unlabored, CTA. Heart: RRR no s3, s4, or murmurs. Abdomen: Soft, non-tender, non-distended, BS + x 4.  Extremities: No clubbing, cyanosis or edema. DP/PT/Radials 2+ and equal bilaterally.  Accessory Clinical Findings  CBC  Recent Labs  06/03/14 0856 06/04/14 0436  WBC 5.1 4.8  HGB 10.8* 10.1*  HCT 33.7* 32.9*  MCV 93.1 94.8  PLT 210 557   Basic Metabolic Panel  Recent Labs  06/02/14 0410 06/03/14 0856 06/04/14 0436  NA 143 140 138  K 3.9 4.1 3.9  CL 103 99 100  CO2 26 25 25   GLUCOSE 148* 164* 153*  BUN 11 12 14   CREATININE 0.97 1.16 1.28  CALCIUM 8.0* 8.8 8.4  MG 1.5 1.8  --      TELE  Paced aroud 77. Freq PVCs  Radiology/Studies  Ct Head Wo Contrast  05/31/2014   CLINICAL DATA:  80 year old male with recent history of syncope complicated by a trauma after falling in striking head on pavement.  EXAM: CT HEAD WITHOUT CONTRAST  TECHNIQUE: Contiguous axial images were obtained from the base of the skull through the vertex without intravenous contrast.  COMPARISON:  Head CT 05/05/2013.  FINDINGS: Mild cerebral and cerebellar atrophy. Patchy and confluent areas of decreased attenuation are noted throughout the deep and periventricular white matter of the cerebral hemispheres bilaterally, compatible with chronic microvascular ischemic disease. No acute displaced skull fractures are identified. No acute intracranial abnormality. Specifically, no evidence of acute post-traumatic intracranial hemorrhage, no definite regions of acute/subacute cerebral ischemia, no focal mass, mass effect, hydrocephalus or  abnormal intra or extra-axial fluid collections. The visualized paranasal sinuses and mastoids are well pneumatized, although the left maxillary sinus again appears hypoplastic.  IMPRESSION: 1. No acute intracranial abnormalities. No signs of significant acute intracranial trauma. 2. Mild cerebral and cerebellar atrophy with extensive chronic microvascular ischemic changes throughout cerebral white matter redemonstrated.   Electronically Signed   By: Vinnie Langton M.D.   On: 05/31/2014 18:01   Dg Chest Port 1 View  05/31/2014   CLINICAL DATA:  Initial encounter for fatigue with dizziness lightheadedness today  EXAM: PORTABLE CHEST - 1 VIEW  COMPARISON:  03/30/2014  FINDINGS: 1433 hrs. Lordotic positioning foreshortened this the hemi thorax. No focal airspace  consolidation or overt pulmonary edema. The cardio pericardial silhouette is enlarged. Imaged bony structures of the thorax are intact. Telemetry leads overlie the chest.  IMPRESSION: Lordotic film without acute cardiopulmonary findings.   Electronically Signed   By: Misty Stanley M.D.   On: 05/31/2014 15:07    ASSESSMENT AND PLAN  Mr. Minella is an 80 y/o M with history of HTN, LBBB, atrial fibrillation/possible flutter 03/2013, DM, HLD, hiatal hernia, and no known significant CAD (minimal irregularities 2005, nonischemic nucs including most recently 05/2012) who presented to [facility] on 05/31/14 with syncope and CHB.  Intermittent complete heart block -the patient continued to have AV block despite medicine washout for several days. Upon getting up to go to the bathroom, he has developed recurrent AV block with symptoms of fatigue and dizziness. The patient has symptomatic AV block and PPM was felt indicated . -- s/p successful implantation of a Medtronic Adapta L dual-chamber pacemaker for symptomatic intermittent complete heart block on 06/03/14  Sinus tachycardia -Unclear etiology. Possibly due to metoprolol washout  -- TSH normal. K 3.9 and Mg 1.8  Hypomagnesemia  -- 1.2--> 1.5--> 1.8  HTN  -- Can now restart meds with pacemaker in place -- Cont Lopressor 50mg  po bid and amiodarone 100 mg po qd.   AFib- chads2vasc score is at least 4. Not presently anticoagulated  -- Will need to be addressed prior to discharge. Given concerns of anemia, may not be a candidate for anticoagulation presently. H/H improved to 10.1/32.9. -- Will leave decision to Dr Dagmar Hait with close follow up as an outpatient (per Dr. Rayann Heman)    Signed, Angelena Form R PA-C  Pager [phone]  Thompson Grayer MD

## 2014-06-04 NOTE — Progress Notes (Signed)
SUBJECTIVE: The patient is doing well today.  At this time, he denies chest pain, shortness of breath, or any new concerns.  Marland Kitchen amiodarone  100 mg Oral Daily  . ferrous sulfate  325 mg Oral Daily  . insulin aspart  0-9 Units Subcutaneous TID WC  . losartan  50 mg Oral Daily  . metoprolol  50 mg Oral BID  . pantoprazole  40 mg Oral Daily  . sertraline  50 mg Oral QHS  . sodium chloride  3 mL Intravenous Q12H      OBJECTIVE: Physical Exam: Filed Vitals:   06/03/14 1900 06/03/14 2026 06/03/14 2317 06/04/14 0440  BP: 149/78 134/61  157/73  Pulse: 91 76 78 73  Temp:  98.3 F (36.8 C)  98.5 F (36.9 C)  TempSrc:  Oral  Oral  Resp: 20 20 16 18   Height:  6\' 4"  (1.93 m)    Weight:  266 lb 11.2 oz (120.974 kg)  266 lb 4.6 oz (120.788 kg)  SpO2: 94% 96%  97%    Intake/Output Summary (Last 24 hours) at 06/04/14 0929 Last data filed at 06/04/14 0600  Gross per 24 hour  Intake    579 ml  Output    900 ml  Net   -321 ml    Telemetry reveals sinus rhythm with intermittent V pacing  GEN- The patient is well appearing, alert and oriented x 3 today.   Head- normocephalic, atraumatic Eyes-  Sclera clear, conjunctiva pink Ears- hearing intact Oropharynx- clear Neck- supple  Lungs- Clear to ausculation bilaterally, normal work of breathing Heart- Regular rate and rhythm, no murmurs, rubs or gallops, PMI not laterally displaced GI- soft, NT, ND, + BS Extremities- no clubbing, cyanosis, +1 edema Neuro- strength and sensation are intact  LABS: Basic Metabolic Panel:  Recent Labs  06/02/14 0410 06/03/14 0856 06/04/14 0436  NA 143 140 138  K 3.9 4.1 3.9  CL 103 99 100  CO2 26 25 25   GLUCOSE 148* 164* 153*  BUN 11 12 14   CREATININE 0.97 1.16 1.28  CALCIUM 8.0* 8.8 8.4  MG 1.5 1.8  --    Liver Function Tests: No results found for this basename: AST, ALT, ALKPHOS, BILITOT, PROT, ALBUMIN,  in the last 72 hours No results found for this basename: LIPASE, AMYLASE,  in the  last 72 hours CBC:  Recent Labs  06/03/14 0856 06/04/14 0436  WBC 5.1 4.8  HGB 10.8* 10.1*  HCT 33.7* 32.9*  MCV 93.1 94.8  PLT 210 166   RADIOLOGY: Ct Head Wo Contrast  05/31/2014   CLINICAL DATA:  80 year old male with recent history of syncope complicated by a trauma after falling in striking head on pavement.  EXAM: CT HEAD WITHOUT CONTRAST  TECHNIQUE: Contiguous axial images were obtained from the base of the skull through the vertex without intravenous contrast.  COMPARISON:  Head CT 05/05/2013.  FINDINGS: Mild cerebral and cerebellar atrophy. Patchy and confluent areas of decreased attenuation are noted throughout the deep and periventricular white matter of the cerebral hemispheres bilaterally, compatible with chronic microvascular ischemic disease. No acute displaced skull fractures are identified. No acute intracranial abnormality. Specifically, no evidence of acute post-traumatic intracranial hemorrhage, no definite regions of acute/subacute cerebral ischemia, no focal mass, mass effect, hydrocephalus or abnormal intra or extra-axial fluid collections. The visualized paranasal sinuses and mastoids are well pneumatized, although the left maxillary sinus again appears hypoplastic.  IMPRESSION: 1. No acute intracranial abnormalities. No signs of significant acute intracranial trauma.  2. Mild cerebral and cerebellar atrophy with extensive chronic microvascular ischemic changes throughout cerebral white matter redemonstrated.   Electronically Signed   By: Vinnie Langton M.D.   On: 05/31/2014 18:01   Dg Chest Port 1 View  05/31/2014   CLINICAL DATA:  Initial encounter for fatigue with dizziness lightheadedness today  EXAM: PORTABLE CHEST - 1 VIEW  COMPARISON:  03/30/2014  FINDINGS: 1433 hrs. Lordotic positioning foreshortened this the hemi thorax. No focal airspace consolidation or overt pulmonary edema. The cardio pericardial silhouette is enlarged. Imaged bony structures of the thorax are  intact. Telemetry leads overlie the chest.  IMPRESSION: Lordotic film without acute cardiopulmonary findings.   Electronically Signed   By: Misty Stanley M.D.   On: 05/31/2014 15:07    ASSESSMENT AND PLAN:  Active Problems:   HTN (hypertension)   Diabetes mellitus   Atrial fibrillation   Bradycardia with less than 30 beats per minute   Complete heart block   Syncope   Lactic acidosis   AKI (acute kidney injury)   Anemia  1. intermittent complete heart block with syncope Normal device interrogation this am cxr reveals stable leads, no ptx No driving x 6 months (pt aware)  2. Afib/ atrial tachycardia He has had some atrial tachycardia here but no afib since admission Resume home metoprolol and amiodarone regimen Given his concerns of anemia, I have instructed him to follow-up with Dr Dagmar Hait to determine when he can resume xarelto.  Continue iron for now  3. Hypokalemia/ hypomagnesemia replete Would benefit from repeat labs by Dr Dagmar Hait this week  Wound check in my office in 10 days I will see in 3 months  Thompson Grayer, MD 06/04/2014 9:29 AM

## 2014-06-04 NOTE — Discharge Instructions (Signed)
° ° °  Supplemental Discharge Instructions for  Pacemaker/Defibrillator Patients  Activity No heavy lifting or vigorous activity with your left/right arm for 6 to 8 weeks.  Do not raise your left/right arm above your head for one week.  Gradually raise your affected arm as drawn below.              10/12                      10/13                    10/14                       10/15 __  NO DRIVING for   6 months  ; you may begin driving on  April 38VF 2016   .  WOUND CARE   Keep the wound area clean and dry.  Do not get this area wet for one week. No showers for one week.   The tape/steri-strips on your wound will fall off; do not pull them off.  No bandage is needed on the site.  DO  NOT apply any creams, oils, or ointments to the wound area.   If you notice any drainage or discharge from the wound, any swelling or bruising at the site, or you develop a fever > 101? F after you are discharged home, call the office at once.  Special Instructions   You are still able to use cellular telephones; use the ear opposite the side where you have your pacemaker/defibrillator.  Avoid carrying your cellular phone near your device.   When traveling through airports, show security personnel your identification card to avoid being screened in the metal detectors.  Ask the security personnel to use the hand wand.   Avoid arc welding equipment, MRI testing (magnetic resonance imaging), TENS units (transcutaneous nerve stimulators).  Call the office for questions about other devices.   Avoid electrical appliances that are in poor condition or are not properly grounded.   Microwave ovens are safe to be near or to operate.  Additional information for defibrillator patients should your device go off:   If your device goes off ONCE and you feel fine afterward, notify the device clinic nurses.   If your device goes off ONCE and you do not feel well afterward, call 911.   If your device goes off TWICE, call  911.   If your device goes off THREE times in one day, call 911.  DO NOT DRIVE YOURSELF OR A FAMILY MEMBER WITH A DEFIBRILLATOR TO THE HOSPITAL--CALL 911.

## 2014-06-04 NOTE — Discharge Summary (Signed)
Discharge Summary   Patient ID: Richard [name] MRN: [id number], DOB/AGE: 1931-08-16 80 y.o. Admit date: 05/31/2014 D/C date:     06/04/2014  Primary Cardiologist: Dr. Nahser/ Dr. Rayann Heman   Principal Problem:   Complete heart block Active Problems:   HTN (hypertension)   Diabetes mellitus   Atrial fibrillation   Bradycardia with less than 30 beats per minute   Syncope   AKI (acute kidney injury)   Anemia   GERD (gastroesophageal reflux disease)   Orthostatic hypotension   B12 deficiency anemia   Hypertension    Admission Dates: 05/31/14-06/04/14 Discharge Diagnosis: Symptomatic intermittent complete heart block s/p successful Medtronic Adapta L model ADDRL 1 (serial number [id number]) pacemaker on 06/03/14.   HPI: Richard [name] is a 80 y.o. male with a history of HTN, LBBB, atrial fibrillation/possible flutter 03/2013, DM, HLD, hiatal hernia, and no known significant CAD (minimal irregularities 2005, nonischemic nucs including most recently 05/2012) who presented to [facility] on 05/31/14 with syncope and CHB.  From notes it looks like he was diagnosed with AF 03/2013 with difficult to control rates despite beta blocker. He was placed on amiodarone and Xarelto and has not had evidence of recurrence in the outpatient setting. 2 weeks ago prior to admission he reportedly had to have an iron infusion because his Hgb was low. He was at [facility] on the day of presentation and got dizzy while getting out of the truck. He felt very weak all of a sudden then passed out very briefly reportedly striking his head against the pavement. He came to and EMS was called. He was found to have a narrow complex escape rhythm in the 30's. He was brought emergently to the ED. HR did not respond to atropine and he required transcutaneous pacing. A trial of nonpacing was undertaken and the patient had underlying flat line of 5 seconds thus transcutaneous pacing was quickly restarted. He was brought up to the cath lab  for emergent temporary wire placement. He has not had any chest pain but has had mild SOB for about a week. He is mentating in the cath lab but does slightly hesitate to answer questions. There is no obvious external head injury.    Hospital Course  Intermittent complete heart block -the patient continued to have AV block despite medicine washout for several days. Upon getting up to go to the bathroom, he has developed recurrent AV block with symptoms of fatigue and dizziness. The patient had symptomatic AV block and PPM was felt indicated .  -- s/p successful implantation of a Medtronic Adapta L dual-chamber pacemaker for symptomatic intermittent complete heart block on 06/03/14  -- ECHO on this admission with normal LV function and LVH -- Normal device interrogation this am  -- Cxr reveals stable leads, no ptx  -- No driving x 6 months (pt aware)  Syncope- Likely due to CHB -- Xarelto held and head CT on this admission with no acute bleed.  -- Continue to hold Xarelto until seen by PCP  HTN  -- Can now restart meds with pacemaker in place  -- Cont Lopressor 50mg  po bid, amlodipine 5mg  po qd and benazepril 40mg  po qd  Afib/ atrial tachycardia -- He has had some atrial tachycardia here but no afib since admission  -- TSH normal. K 3.9 and Mg 1.8  -- Resume home metoprolol and amiodarone regimen  -- Given his concerns of anemia, Dr. Rayann Heman has instructed him to follow-up with Dr Dagmar Hait to determine  when he can resume xarelto. Continue iron for now. Chads2vasc score is at least 4. Will Hold Xarelto on discharge.  Hypokalemia/ hypomagnesemia  -- Repleted,  K 3.9 and Mg 1.8  -- Would benefit from repeat labs by Dr Dagmar Hait this week   The patient has had an uncomplicated hospital course and is recovering well.  He has been seen by Dr. Rayann Heman today and deemed ready for discharge home. Discharge medications are listed below. He will need a 10 day wound check and then follow up with Dr. Rayann Heman is  being arranged for 3 months.   Discharge Vitals: Blood pressure 157/73, pulse 73, temperature 98.5 F (36.9 C), temperature source Oral, resp. rate 18, height 6\' 4"  (1.93 m), weight 266 lb 4.6 oz (120.788 kg), SpO2 97.00%.  Labs: Lab Results  Component Value Date   WBC 4.8 06/04/2014   HGB 10.1* 06/04/2014   HCT 32.9* 06/04/2014   MCV 94.8 06/04/2014   PLT 166 06/04/2014    Recent Labs Lab 06/01/14 0440  06/04/14 0436  NA 142  < > 138  K 3.2*  < > 3.9  CL 105  < > 100  CO2 23  < > 25  BUN 14  < > 14  CREATININE 1.04  < > 1.28  CALCIUM 7.7*  < > 8.4  PROT 6.2  --   --   BILITOT 0.3  --   --   ALKPHOS 63  --   --   ALT 24  --   --   AST 35  --   --   GLUCOSE 118*  < > 153*  < > = values in this interval not displayed.  Lab Results  Component Value Date   CHOL 111 06/01/2014   HDL 31* 06/01/2014   LDLCALC 48 06/01/2014   TRIG 159* 06/01/2014     Diagnostic Studies/Procedures   Ct Head Wo Contrast  05/31/2014   CLINICAL DATA:  80 year old male with recent history of syncope complicated by a trauma after falling in striking head on pavement.  EXAM: CT HEAD WITHOUT CONTRAST  TECHNIQUE: Contiguous axial images were obtained from the base of the skull through the vertex without intravenous contrast.  COMPARISON:  Head CT 05/05/2013.  FINDINGS: Mild cerebral and cerebellar atrophy. Patchy and confluent areas of decreased attenuation are noted throughout the deep and periventricular white matter of the cerebral hemispheres bilaterally, compatible with chronic microvascular ischemic disease. No acute displaced skull fractures are identified. No acute intracranial abnormality. Specifically, no evidence of acute post-traumatic intracranial hemorrhage, no definite regions of acute/subacute cerebral ischemia, no focal mass, mass effect, hydrocephalus or abnormal intra or extra-axial fluid collections. The visualized paranasal sinuses and mastoids are well pneumatized, although the left  maxillary sinus again appears hypoplastic.  IMPRESSION: 1. No acute intracranial abnormalities. No signs of significant acute intracranial trauma. 2. Mild cerebral and cerebellar atrophy with extensive chronic microvascular ischemic changes throughout cerebral white matter redemonstrated.    Dg Chest Port 1 View  05/31/2014   CLINICAL DATA:  Initial encounter for fatigue with dizziness lightheadedness today  EXAM: PORTABLE CHEST - 1 VIEW  COMPARISON:  03/30/2014  FINDINGS: 1433 hrs. Lordotic positioning foreshortened this the hemi thorax. No focal airspace consolidation or overt pulmonary edema. The cardio pericardial silhouette is enlarged. Imaged bony structures of the thorax are intact. Telemetry leads overlie the chest.  IMPRESSION: Lordotic film without acute cardiopulmonary findings.      2D ECHO: 06/01/2014 LV EF: 55% - 60% Study  Conclusions - Left ventricle: The cavity size was normal. Wall thickness was increased in a pattern of moderate LVH. Systolic function was normal. The estimated ejection fraction was in the range of 55% to 60%. Wall motion was normal; there were no regional wall motion abnormalities. Doppler parameters are consistent with abnormal left ventricular relaxation (grade 1 diastolic dysfunction). - Left atrium: The atrium was mildly dilated. - Right atrium: The atrium was mildly dilated.   PREPROCEDURE DIAGNOSIS: Symptomatic intermittent complete heart block  POSTPROCEDURE DIAGNOSIS: Symptomatic intermittent complete heart block  PROCEDURES:  1. Left upper extremity venography.  2. Pacemaker implantation.  INTRODUCTION: KERIM STATZER is a 80 y.o. male with a history of intermittent complete heart block who presents today for pacemaker implantation. The patient reports intermittent episodes of dizziness and weakness over the past few weeks. No reversible causes have been identified. The patient therefore presents today for pacemaker implantation.  DESCRIPTION OF  PROCEDURE: Informed written consent was obtained, and the patient was brought to the electrophysiology lab in a fasting state. The patient required no sedation for the procedure today. The patients left chest was prepped and draped in the usual sterile fashion by the EP lab staff. The skin overlying the left deltopectoral region was infiltrated with lidocaine for local analgesia. A 4-cm incision was made over the left deltopectoral region. A left subcutaneous pacemaker pocket was fashioned using a combination of sharp and blunt dissection. Electrocautery was required to assure hemostasis.  Left Upper Extremity Venography:  A venogram of the left upper extremity was performed, which revealed a large left cephalic vein, which emptied into a large left subclavian vein. The left axillary vein was small to moderate in size and had a cephalad location.  RA/RV Lead Placement:  The left axillary vein was visualized and cannulated. Through the left axillary vein, a Medtronic model [id number] (serial number [id number]) right atrial lead and a Medtronic model 6283- 58 (serial number [id number]) right ventricular lead were advanced with fluoroscopic visualization into the right atrial appendage and right ventricular apex positions respectively. Initial atrial lead P- waves measured 1.4-2 mV with impedance of 734 ohms and a threshold of 1.6 V at 0.5 msec. Right ventricular lead R-waves measured 18 mV with an impedance of 531 ohms and a threshold of 0.5 V at 0.5 msec. Both leads were secured to the pectoralis fascia using #2-0 silk over the suture sleeves.  Device Placement:  The leads were then connected to a Medtronic Adapta L model ADDRL 1 (serial number [id number]) pacemaker. The pocket was irrigated with copious gentamicin solution. The pacemaker was then placed into the pocket. The pocket was then closed in 2 layers with 2.0 Vicryl suture for the subcutaneous and subcuticular layers. Steri- Strips and a sterile  dressing were then applied. EBL<79ml. There were no early apparent complications.  CONCLUSIONS:  1. Successful implantation of a Medtronic Adapta L dual-chamber pacemaker for symptomatic intermittent complete heart block  2. No early apparent complications    Discharge Medications     Medication List    STOP taking these medications       rivaroxaban 20 MG Tabs tablet  Commonly known as:  XARELTO      TAKE these medications       acetaminophen 500 MG tablet  Commonly known as:  TYLENOL  Take 500 mg by mouth every 6 (six) hours as needed for mild pain.     amiodarone 200 MG tablet  Commonly known as:  PACERONE  Take 1 tablet (200 mg) on Monday, Wednesday, Friday only     amLODipine 5 MG tablet  Commonly known as:  NORVASC  Take 1 tablet (5 mg total) by mouth daily.     benazepril 40 MG tablet  Commonly known as:  LOTENSIN  Take 40 mg by mouth daily.     diazepam 5 MG tablet  Commonly known as:  VALIUM  Take 5 mg by mouth at bedtime.     ferrous sulfate 325 (65 FE) MG tablet  Take 1 tablet (325 mg total) by mouth daily.     fluticasone 50 MCG/ACT nasal spray  Commonly known as:  FLONASE  Place 1 spray into the nose daily.     metFORMIN 1000 MG tablet  Commonly known as:  GLUCOPHAGE  Take 1,000 mg by mouth 2 (two) times daily with a meal.     metoprolol 50 MG tablet  Commonly known as:  LOPRESSOR  Take 50 mg by mouth 2 (two) times daily.     omeprazole 20 MG capsule  Commonly known as:  PRILOSEC  Take 20 mg by mouth daily.     pioglitazone 30 MG tablet  Commonly known as:  ACTOS  Take 30 mg by mouth daily.     sertraline 50 MG tablet  Commonly known as:  ZOLOFT  Take 50 mg by mouth daily.     VITAMIN B-12 IJ  Inject as directed every 30 (thirty) days.        Disposition   The patient will be discharged in stable condition to home.  Follow-up Information   Follow up with [facility] On 06/14/2014.  ([address]. For a device check in 10 day. , The office will call you to make an appoinment., If you do not hear from them, please contact them.)    Contact information:   [place] [phone] [phone]      Follow up with Thompson Grayer, MD On 09/04/2014. (The office will call you to make an appoinment., If you do not hear from them, please contact them., You should be seen within 3 months.)    Specialty:  Cardiology   Contact information:   [place] [address] [phone]       Follow up with Tivis Ringer, MD. (Please seen your PCP as soon as possible to talk about Xarelto)    Specialty:  Internal Medicine   Contact information:   [address] [place] [address] [phone]         Duration of Discharge Encounter: Greater than 30 minutes including physician and PA time.  Mable Fill R PA-C 06/04/2014, 9:49 AM  [place] Grayer MD

## 2014-06-05 ENCOUNTER — Encounter: Payer: Self-pay | Admitting: Internal Medicine

## 2014-06-05 MED FILL — Lidocaine HCl Local Preservative Free (PF) Inj 1%: INTRAMUSCULAR | Qty: 30 | Status: AC

## 2014-06-05 MED FILL — Bivalirudin For IV Soln 250 MG: INTRAVENOUS | Qty: 250 | Status: AC

## 2014-06-05 MED FILL — Nitroglycerin IV Soln 200 MCG/ML in D5W: INTRAVENOUS | Qty: 1 | Status: AC

## 2014-06-05 MED FILL — Sodium Chloride IV Soln 0.9%: INTRAVENOUS | Qty: 50 | Status: AC

## 2014-06-05 MED FILL — Fentanyl Citrate Inj 0.05 MG/ML: INTRAMUSCULAR | Qty: 2 | Status: AC

## 2014-06-05 MED FILL — Midazolam HCl Inj 2 MG/2ML (Base Equivalent): INTRAMUSCULAR | Qty: 2 | Status: AC

## 2014-06-05 MED FILL — Heparin Sodium (Porcine) 2 Unit/ML in Sodium Chloride 0.9%: INTRAMUSCULAR | Qty: 1000 | Status: AC

## 2014-06-06 DIAGNOSIS — D649 Anemia, unspecified: Secondary | ICD-10-CM | POA: Diagnosis not present

## 2014-06-06 DIAGNOSIS — I442 Atrioventricular block, complete: Secondary | ICD-10-CM | POA: Diagnosis not present

## 2014-06-06 DIAGNOSIS — N183 Chronic kidney disease, stage 3 (moderate): Secondary | ICD-10-CM | POA: Diagnosis not present

## 2014-06-07 ENCOUNTER — Telehealth: Payer: Self-pay | Admitting: Internal Medicine

## 2014-06-07 NOTE — Telephone Encounter (Signed)
New message  Pt wife called back to discuss the types of activities her husband, the pt can partake in. Please call back to discuss

## 2014-06-07 NOTE — Telephone Encounter (Signed)
Spoke w/wife and answered all questions regarding pacemaker implant. Wife is concerned because pt was sleeping and was found with left arm above head. Instructed that device would be checked on 06-14-14 but if there was a problem with device, pt may feel like before ppm implant.

## 2014-06-14 ENCOUNTER — Encounter: Payer: Self-pay | Admitting: Internal Medicine

## 2014-06-14 ENCOUNTER — Ambulatory Visit (INDEPENDENT_AMBULATORY_CARE_PROVIDER_SITE_OTHER): Payer: Medicare Other | Admitting: *Deleted

## 2014-06-14 ENCOUNTER — Ambulatory Visit (INDEPENDENT_AMBULATORY_CARE_PROVIDER_SITE_OTHER): Payer: Medicare Other | Admitting: Internal Medicine

## 2014-06-14 DIAGNOSIS — R001 Bradycardia, unspecified: Secondary | ICD-10-CM | POA: Diagnosis not present

## 2014-06-14 DIAGNOSIS — I480 Paroxysmal atrial fibrillation: Secondary | ICD-10-CM

## 2014-06-14 DIAGNOSIS — E538 Deficiency of other specified B group vitamins: Secondary | ICD-10-CM | POA: Diagnosis not present

## 2014-06-14 LAB — MDC_IDC_ENUM_SESS_TYPE_INCLINIC
Battery Voltage: 2.8 V
Brady Statistic AP VS Percent: 1 %
Brady Statistic AS VS Percent: 88 %
Date Time Interrogation Session: 20151021091835
Lead Channel Impedance Value: 763 Ohm
Lead Channel Pacing Threshold Amplitude: 0.75 V
Lead Channel Pacing Threshold Amplitude: 0.75 V
Lead Channel Pacing Threshold Pulse Width: 0.4 ms
Lead Channel Pacing Threshold Pulse Width: 0.4 ms
Lead Channel Sensing Intrinsic Amplitude: 22.4 mV
Lead Channel Setting Pacing Amplitude: 3.5 V
Lead Channel Setting Pacing Amplitude: 3.5 V
Lead Channel Setting Sensing Sensitivity: 5.6 mV
MDC IDC MSMT BATTERY IMPEDANCE: 100 Ohm
MDC IDC MSMT BATTERY REMAINING LONGEVITY: 164 mo
MDC IDC MSMT LEADCHNL RA IMPEDANCE VALUE: 497 Ohm
MDC IDC MSMT LEADCHNL RA SENSING INTR AMPL: 2 mV
MDC IDC SET LEADCHNL RV PACING PULSEWIDTH: 0.4 ms
MDC IDC STAT BRADY AP VP PERCENT: 0 %
MDC IDC STAT BRADY AS VP PERCENT: 11 %

## 2014-06-14 NOTE — Progress Notes (Signed)
Wound check looks good today.  Normal PPM function.  Wound is healing nicely.  Per Dr Dagmar Hait (according to patient), xarelto to be held for 4 weeks and then resumed given recent anemia.  Pt has had no afib since ppm implant. I would concern that we need to hold anticoagulation until anemia is controlled.  I would not favor ASA as an alternative.    Restart anticoagulation with xarelto when able.  I will defer timing to Dr Dagmar Hait.  Return for routine device follow-up in 3 months.

## 2014-06-14 NOTE — Progress Notes (Signed)

## 2014-06-28 ENCOUNTER — Telehealth: Payer: Self-pay | Admitting: Internal Medicine

## 2014-06-28 NOTE — Telephone Encounter (Signed)
Pt's wife called regarding pt needing to have a sterid shot in his back. Procedure will be schedule after clearance. Pt has a pacemaker on 06/03/14. Pt is aware that this message will be send to the Md for recommendations.

## 2014-06-28 NOTE — Telephone Encounter (Signed)
New message      Pt got pacemaker on 10-10. Can he get a steroid shot in his back by Dr Nelva Bush?

## 2014-06-29 NOTE — Telephone Encounter (Signed)
[place] Grayer, MD at 06/14/2014 10:02 AM     Status: Signed       Expand All Collapse All   Wound check looks good today. Normal PPM function. Wound is healing nicely.  Per Dr Dagmar Hait (according to patient), xarelto to be held for 4 weeks and then resumed given recent anemia. Pt has had no afib since ppm implant. I would concern that we need to hold anticoagulation until anemia is controlled. I would not favor ASA as an alternative.   Restart anticoagulation with xarelto when able. I will defer timing to Dr Dagmar Hait.  Return for routine device follow-up in 3 months.

## 2014-06-30 NOTE — Telephone Encounter (Signed)
Proceed if medically necessary. I dont think he is on anticoagulation presently.

## 2014-07-03 DIAGNOSIS — Z6833 Body mass index (BMI) 33.0-33.9, adult: Secondary | ICD-10-CM | POA: Diagnosis not present

## 2014-07-03 DIAGNOSIS — N183 Chronic kidney disease, stage 3 (moderate): Secondary | ICD-10-CM | POA: Diagnosis not present

## 2014-07-03 DIAGNOSIS — D649 Anemia, unspecified: Secondary | ICD-10-CM | POA: Diagnosis not present

## 2014-07-03 DIAGNOSIS — I10 Essential (primary) hypertension: Secondary | ICD-10-CM | POA: Diagnosis not present

## 2014-07-03 DIAGNOSIS — I442 Atrioventricular block, complete: Secondary | ICD-10-CM | POA: Diagnosis not present

## 2014-07-03 DIAGNOSIS — T148 Other injury of unspecified body region: Secondary | ICD-10-CM | POA: Diagnosis not present

## 2014-07-03 DIAGNOSIS — I480 Paroxysmal atrial fibrillation: Secondary | ICD-10-CM | POA: Diagnosis not present

## 2014-07-07 DIAGNOSIS — E119 Type 2 diabetes mellitus without complications: Secondary | ICD-10-CM | POA: Diagnosis not present

## 2014-07-07 DIAGNOSIS — I480 Paroxysmal atrial fibrillation: Secondary | ICD-10-CM | POA: Diagnosis not present

## 2014-07-07 DIAGNOSIS — R0901 Asphyxia: Secondary | ICD-10-CM | POA: Diagnosis not present

## 2014-07-07 DIAGNOSIS — I442 Atrioventricular block, complete: Secondary | ICD-10-CM | POA: Diagnosis not present

## 2014-07-07 DIAGNOSIS — R0600 Dyspnea, unspecified: Secondary | ICD-10-CM | POA: Diagnosis not present

## 2014-07-07 DIAGNOSIS — I10 Essential (primary) hypertension: Secondary | ICD-10-CM | POA: Diagnosis not present

## 2014-07-07 DIAGNOSIS — D649 Anemia, unspecified: Secondary | ICD-10-CM | POA: Diagnosis not present

## 2014-07-07 DIAGNOSIS — N183 Chronic kidney disease, stage 3 (moderate): Secondary | ICD-10-CM | POA: Diagnosis not present

## 2014-07-11 ENCOUNTER — Encounter: Payer: Self-pay | Admitting: Cardiovascular Disease

## 2014-07-11 ENCOUNTER — Ambulatory Visit (INDEPENDENT_AMBULATORY_CARE_PROVIDER_SITE_OTHER): Payer: Medicare Other | Admitting: Cardiovascular Disease

## 2014-07-11 VITALS — BP 138/82 | HR 64 | Ht 76.0 in | Wt 260.8 lb

## 2014-07-11 DIAGNOSIS — I480 Paroxysmal atrial fibrillation: Secondary | ICD-10-CM | POA: Diagnosis not present

## 2014-07-11 MED ORDER — RIVAROXABAN 20 MG PO TABS
20.0000 mg | ORAL_TABLET | Freq: Every day | ORAL | Status: DC
Start: 2014-07-11 — End: 2015-08-03

## 2014-07-11 NOTE — Patient Instructions (Addendum)
Your physician has recommended you make the following change in your medication:  START Xarelto 20 mg once daily with the largest meal of the day  Your physician wants you to follow-up in: 6 months with Dr. Acie Fredrickson.  You will receive a reminder letter in the mail two months in advance. If you don't receive a letter, please call our office to schedule the follow-up appointment.   For your  leg edema you  should do  the following 1. Leg elevation - I recommend the Lounge Dr. Leg rest.  See below for details  2. Salt restriction  -  Use potassium chloride instead of regular salt as a salt substitute. 3. Walk regularly 4. Compression hose - [facility] 5. Weight loss     Go to [email or web address]

## 2014-07-11 NOTE — Assessment & Plan Note (Signed)
He has a history of paroxysmal atrial fibrillation. His Xarelto was stopped due to anemia but he's had an extensive workup and has not been found have any blood in the stool. He's received infusions.  I think at this point we should go ahead and restart the Xarelto.  If he has any significant bleeding in his stool or as a further drop in his hemoglobin we should certainly should consider stopping the blood thinners. I'll see him in 6 months. He will also followup with his medical doctor for further evaluation of his anemia.

## 2014-07-11 NOTE — Assessment & Plan Note (Signed)
Richard [name] was admitted with an episode of syncope. He was found have complete heart block with a heart rate of 20-30. He initially had placement of a temporary pacing wire Permanent pacemaker. He's done well. He feels quite a bit better after the permanent pacemaker. Continue same medications.

## 2014-07-11 NOTE — Progress Notes (Signed)
Richard [name] Date of Birth  [birth year] [facility] [address]    [place], [place]  [address] [phone]  Fax  [phone]  Problem List 1. LBBB 2. HTN 3. Hyperlipidemia 4. Diabetes mellitus 5. Hiatal hernia 6. Atrial fibrillation  History of Present Illness:  Richard [name] is a 80 year old old gentleman with a history of left bundle branch block. He has a history of hypertension. He's had some episodes of chest pain. He was recently seen in the emergency room with episodes of chest pain. He had multiple troponin levels which were normal. He has CT angiogram which was negative for a PE. He was not have a very large hiatal hernia.  He had a normal cardiac catheterization in 2005. He had a normal stress Myoview study in 2009.  April 18, 2013:  Richard [name] is an 80 year old gentleman with the above-noted medical problems. He was recently admitted to the hospital with atrial fibrillation. He was started on amiodarone.  He is feeling better.   His amlodipidine was held and  BP has been  elevated.    Nov. 21, 2014:  No problems.  Exercising some.  No CP , no dyspnea.    Jan 11, 2014: Richard [name] complains of profound weakness.  Also complains of orthostatic hypotension.   He's not noticed any blood in his stool.  Nov. 17, 2015:  Richard [name] is a 80 y.o. man who I follow with atrial fib, HTN, hyperlipidemia.   He was admitted to the hospital in October with an episode of syncope. He was found have complete heart block and required a temporary pacer. He had placement of permanent pacemaker by Dr. Rayann Heman.  He feels the best that he has   Current Outpatient Prescriptions on File Prior to Visit  Medication Sig Dispense Refill  . acetaminophen (TYLENOL) 500 MG tablet Take 500 mg by mouth every 6 (six) hours as needed for mild pain.    Marland Kitchen amiodarone (PACERONE) 200 MG tablet Take 1 tablet (200 mg) on Monday, Wednesday, Friday only 90 tablet 4  . amLODipine (NORVASC) 5 MG tablet Take 1  tablet (5 mg total) by mouth daily. 90 tablet 3  . benazepril (LOTENSIN) 40 MG tablet Take 40 mg by mouth daily.      . Cyanocobalamin (VITAMIN B-12 IJ) Inject as directed every 30 (thirty) days.     . diazepam (VALIUM) 5 MG tablet Take 5 mg by mouth at bedtime.      . ferrous sulfate 325 (65 FE) MG tablet Take 1 tablet (325 mg total) by mouth daily. 30 tablet 0  . fluticasone (FLONASE) 50 MCG/ACT nasal spray Place 1 spray into the nose daily.      . metFORMIN (GLUCOPHAGE) 1000 MG tablet Take 1,000 mg by mouth 2 (two) times daily with a meal.    . metoprolol (LOPRESSOR) 50 MG tablet Take 50 mg by mouth 2 (two) times daily.    Marland Kitchen omeprazole (PRILOSEC) 20 MG capsule Take 20 mg by mouth daily.     . pioglitazone (ACTOS) 30 MG tablet Take 30 mg by mouth daily.      . sertraline (ZOLOFT) 50 MG tablet Take 50 mg by mouth daily.     No current facility-administered medications on file prior to visit.    Allergies  Allergen Reactions  . Codeine Other (See Comments)    Hallucinations   . Citalopram Swelling and Rash    Past Medical History  Diagnosis Date  . Hypertension   . Anxiety   .  Microcytic anemia   . Hiatal hernia   . Skin cancer of lip   . Sinus drainage   . OSA on CPAP   . Type II diabetes mellitus   . GERD (gastroesophageal reflux disease)   . Arthritis   . Nephrolithiasis   . On home oxygen therapy     a. 2L w/CPAP at night  . [place] spotted fever ~ 1945  . LBBB (left bundle branch block)   . Hyperlipidemia   . Atrial fibrillation     a. Dx 03/2013, notes report atrial fibrillation/atrial flutter, placed on amiodarone. NSR in subsequent OV's.  . Chest pain     a. H/o CTA negative for PE 2012, normal cath 2005, normal nucs previously including 05/2012.  . Orthostatic hypotension   . B12 deficiency anemia   . Complete heart block     a. s/p Medtronic Adapta L model ADDRL 1 (serial number [id number]) pacemaker.    Past Surgical History  Procedure Laterality  Date  . Vasectomy      Hx of   . Lumbar disc surgery  ~ 1993  . Inguinal hernia repair Right   . Cardiac catheterization      by Dr. Acie Fredrickson, January 24, 2004, that shows minimal coronary artery irregularities and normal left ventricular function  . Cataract extraction w/ intraocular lens  implant, bilateral Bilateral   . Skin cancer excision      "lower lip" (04/06/2013)    History  Smoking status  . Former Smoker -- 0.00 packs/day for 20 years  . Types: Cigarettes  Smokeless tobacco  . Never Used    Comment: 04/06/2013 "quit smoking years ago; didn't smoke that much; probably 20 years; 1ppd"    History  Alcohol Use No    Family History  Problem Relation Age of Onset  . Other      Parents both died of old age; other conditions not known    Reviw of Systems:  Reviewed in the HPI.  All other systems are negative.  Physical Exam: BP 138/82 mmHg  Pulse 64  Ht 6\' 4"  (1.93 m)  Wt 260 lb 12.8 oz (118.298 kg)  BMI 31.76 kg/m2 The patient is alert and oriented x 3.  The mood and affect are normal.   Skin: warm and dry.  Color is normal.    HEENT:   Normal carotids, no JVD  Lungs: clear   Heart: distant HS. RR, no murmur , bradycardic.   Abdomen:  obese nt. Normal BS  Extremities:  Trace edema in Right leg, minimal in left.  Neuro:  Non focal    ECG: Nov. 21, 2014:  NSR at 67, LBBB  Assessment / Plan:

## 2014-07-18 ENCOUNTER — Encounter: Payer: Self-pay | Admitting: Internal Medicine

## 2014-07-18 DIAGNOSIS — E538 Deficiency of other specified B group vitamins: Secondary | ICD-10-CM | POA: Diagnosis not present

## 2014-07-19 ENCOUNTER — Other Ambulatory Visit: Payer: Self-pay | Admitting: Cardiovascular Disease

## 2014-07-26 DIAGNOSIS — M5416 Radiculopathy, lumbar region: Secondary | ICD-10-CM | POA: Diagnosis not present

## 2014-08-03 ENCOUNTER — Encounter (HOSPITAL_COMMUNITY): Payer: Self-pay | Admitting: Interventional Cardiology

## 2014-08-07 ENCOUNTER — Encounter: Payer: Self-pay | Admitting: Internal Medicine

## 2014-08-09 ENCOUNTER — Other Ambulatory Visit: Payer: Self-pay | Admitting: Cardiovascular Disease

## 2014-08-11 ENCOUNTER — Encounter: Payer: Self-pay | Admitting: Internal Medicine

## 2014-08-21 ENCOUNTER — Other Ambulatory Visit: Payer: Self-pay | Admitting: Cardiovascular Disease

## 2014-08-22 NOTE — Telephone Encounter (Signed)
Thayer Headings, MD at 07/11/2014 11:51 AM  metoprolol (LOPRESSOR) 50 MG tablet  Take 50 mg by mouth 2 (two) times daily  Patient Instructions   Your physician has recommended you make the following change in your medication:  START Xarelto 20 mg once daily with the largest meal of the day

## 2014-08-24 DIAGNOSIS — D649 Anemia, unspecified: Secondary | ICD-10-CM | POA: Diagnosis not present

## 2014-09-04 ENCOUNTER — Encounter: Payer: Medicare Other | Admitting: Internal Medicine

## 2014-09-08 ENCOUNTER — Telehealth: Payer: Self-pay | Admitting: Internal Medicine

## 2014-09-08 NOTE — Telephone Encounter (Signed)
LMOVM for pt wife to return call.  

## 2014-09-08 NOTE — Telephone Encounter (Signed)
New Msg      Pt wife calling  having trouble with transmission for device. Needs some assistance troubleshooting.    Please return pt call

## 2014-09-11 ENCOUNTER — Telehealth: Payer: Self-pay | Admitting: Cardiology

## 2014-09-11 NOTE — Telephone Encounter (Signed)
LMOVM for pt wife to return call.  

## 2014-09-11 NOTE — Telephone Encounter (Signed)
Spoke w/ pt and wife and informed her that we would order pt a wirex. She verbalized understanding.

## 2014-09-21 ENCOUNTER — Encounter: Payer: Self-pay | Admitting: Internal Medicine

## 2014-09-21 ENCOUNTER — Ambulatory Visit (INDEPENDENT_AMBULATORY_CARE_PROVIDER_SITE_OTHER): Payer: Medicare Other | Admitting: Internal Medicine

## 2014-09-21 VITALS — BP 136/82 | HR 69 | Ht 75.5 in | Wt 264.0 lb

## 2014-09-21 DIAGNOSIS — I459 Conduction disorder, unspecified: Secondary | ICD-10-CM

## 2014-09-21 DIAGNOSIS — D649 Anemia, unspecified: Secondary | ICD-10-CM | POA: Diagnosis not present

## 2014-09-21 DIAGNOSIS — R001 Bradycardia, unspecified: Secondary | ICD-10-CM

## 2014-09-21 DIAGNOSIS — I480 Paroxysmal atrial fibrillation: Secondary | ICD-10-CM | POA: Diagnosis not present

## 2014-09-21 DIAGNOSIS — I442 Atrioventricular block, complete: Secondary | ICD-10-CM | POA: Diagnosis not present

## 2014-09-21 DIAGNOSIS — I119 Hypertensive heart disease without heart failure: Secondary | ICD-10-CM

## 2014-09-21 NOTE — Patient Instructions (Signed)
Your physician wants you to follow-up in: 12 months with Dr Vallery Ridge will receive a reminder letter in the mail two months in advance. If you don't receive a letter, please call our office to schedule the follow-up appointment.  Remote monitoring is used to monitor your Pacemaker or ICD from home. This monitoring reduces the number of office visits required to check your device to one time per year. It allows [place] to keep an eye on the functioning of your device to ensure it is working properly. You are scheduled for a device check from home on 12/21/14. You may send your transmission at any time that day. If you have a wireless device, the transmission will be sent automatically. After your physician reviews your transmission, you will receive a postcard with your next transmission date.

## 2014-09-21 NOTE — Progress Notes (Signed)
Electrophysiology Office Note   Date:  09/21/2014   ID:  DEONE Richard [name], DOB [birth year], MRN [id number]  PCP:  Tivis Ringer, MD  Cardiologist:  Dr Dagmar Hait Primary Electrophysiologist:  Thompson Grayer, MD    Chief Complaint  Patient presents with  . Follow-up    AFIB     History of Present Illness: Richard [name] is a 81 y.o. male who presents today for electrophysiology evaluation.   He has done well since his PPM implantation.  He has had no further syncope.  He is unaware of any atrial fibrillation.  His anemia is "better".  He is following closely with Dr Dagmar Hait for this.  He is back on Xarelto without evidence of bleeding.  He has stable fatigue and has "no strength".  He feels "give out".  He also has back pain. Today, he denies symptoms of palpitations, chest pain, shortness of breath, orthopnea, PND, lower extremity edema, claudication, dizziness, presyncope, syncope, bleeding, or neurologic sequela. The patient is tolerating medications without difficulties and is otherwise without complaint today.    Past Medical History  Diagnosis Date  . Hypertension   . Anxiety   . Microcytic anemia   . Hiatal hernia   . Skin cancer of lip   . Sinus drainage   . OSA on CPAP   . Type II diabetes mellitus   . GERD (gastroesophageal reflux disease)   . Arthritis   . Nephrolithiasis   . On home oxygen therapy     a. 2L w/CPAP at night  . [place] spotted fever ~ 1945  . LBBB (left bundle branch block)   . Hyperlipidemia   . Atrial fibrillation     a. Dx 03/2013, notes report atrial fibrillation/atrial flutter, placed on amiodarone. NSR in subsequent OV's.  . Chest pain     a. H/o CTA negative for PE 2012, normal cath 2005, normal nucs previously including 05/2012.  . Orthostatic hypotension   . B12 deficiency anemia   . Complete heart block     a. s/p Medtronic Adapta L model ADDRL 1 (serial number [id number]) pacemaker.   Past Surgical History  Procedure Laterality  Date  . Vasectomy      Hx of   . Lumbar disc surgery  ~ 1993  . Inguinal hernia repair Right   . Cardiac catheterization      by Dr. Acie Fredrickson, January 24, 2004, that shows minimal coronary artery irregularities and normal left ventricular function  . Cataract extraction w/ intraocular lens  implant, bilateral Bilateral   . Skin cancer excision      "lower lip" (04/06/2013)  . Temporary pacemaker insertion N/A 05/31/2014    Procedure: TEMPORARY WIRE;  Surgeon: Sinclair Grooms, MD;  Location: [facility] CATH LAB;  Service: Cardiovascular;  Laterality: N/A;  . Permanent pacemaker insertion N/A 06/03/2014    MDT Adapta L implanted by Dr Rayann Heman for syncope and transient AV block     Current Outpatient Prescriptions  Medication Sig Dispense Refill  . acetaminophen (TYLENOL) 500 MG tablet Take 500 mg by mouth every 6 (six) hours as needed for mild pain.    Marland Kitchen amiodarone (PACERONE) 200 MG tablet Take 1 tablet (200 mg) on Monday, Wednesday, Friday only (Patient taking differently: Take 1 tablet (200 mg) by mouth on Monday, Wednesday, Friday only) 90 tablet 4  . amLODipine (NORVASC) 5 MG tablet TAKE 1 TABLET (5 MG TOTAL) BY MOUTH DAILY. 90 tablet 1  . benazepril (LOTENSIN) 40 MG tablet  Take 40 mg by mouth daily.      . Cyanocobalamin (VITAMIN B-12 IJ) Inject as directed every 30 (thirty) days.     . diazepam (VALIUM) 5 MG tablet Take 5 mg by mouth at bedtime.      . ferrous sulfate 325 (65 FE) MG tablet Take 1 tablet (325 mg total) by mouth daily. 30 tablet 0  . fluticasone (FLONASE) 50 MCG/ACT nasal spray Place 1 spray into the nose daily.      . metFORMIN (GLUCOPHAGE) 1000 MG tablet Take 1,000 mg by mouth 2 (two) times daily with a meal.    . metoprolol (LOPRESSOR) 50 MG tablet TAKE 1 TABLET (50 MG TOTAL) BY MOUTH 2 (TWO) TIMES DAILY. 180 tablet 1  . NON FORMULARY CPAP @@ night with Oxygen    . omeprazole (PRILOSEC) 20 MG capsule Take 20 mg by mouth daily.     . pioglitazone (ACTOS) 30 MG tablet Take 30 mg  by mouth daily.      . rivaroxaban (XARELTO) 20 MG TABS tablet Take 1 tablet (20 mg total) by mouth daily with supper. 30 tablet 11  . sertraline (ZOLOFT) 100 MG tablet Take 1 tablet by mouth daily.     No current facility-administered medications for this visit.    Allergies:   Codeine and Citalopram   Social History:  The patient  reports that he has quit smoking. His smoking use included Cigarettes. He smoked 0.00 packs per day for 20 years. He has never used smokeless tobacco. He reports that he does not drink alcohol or use illicit drugs.   Family History:  The patient's family hx includes HTN (mother)   ROS:  Please see the history of present illness.   All other systems are reviewed and negative.    PHYSICAL EXAM: VS:  BP 136/82 mmHg  Pulse 69  Ht 6' 3.5" (1.918 m)  Wt 264 lb (119.75 kg)  BMI 32.55 kg/m2  SpO2 94% , BMI Body mass index is 32.55 kg/(m^2). GEN: Well nourished, well developed, in no acute distress HEENT: normal Neck: no JVD, carotid bruits, or masses Cardiac: RRR; no murmurs, rubs, or gallops,no edema  Respiratory:  clear to auscultation bilaterally, normal work of breathing GI: soft, nontender, nondistended, + BS MS: no deformity or atrophy Skin: warm and dry,  device pocket is well healed Neuro:  Strength and sensation are intact Psych: euthymic mood, full affect  EKG:  EKG is ordered today. The ekg ordered today shows sinus rhythm with first degree AV block (PR 238), LBBB   Device interrogation is reviewed today in detail.  See PaceArt for details.   Recent Labs: 03/30/2014: Pro B Natriuretic peptide (BNP) 841.9* 05/31/2014: TSH 3.510 06/01/2014: ALT 24 06/03/2014: Magnesium 1.8 06/04/2014: BUN 14; Creatinine 1.28; Hemoglobin 10.1*; Platelets 166; Potassium 3.9; Sodium 138    Lipid Panel     Component Value Date/Time   CHOL 111 06/01/2014 0440   TRIG 159* 06/01/2014 0440   HDL 31* 06/01/2014 0440   CHOLHDL 3.6 06/01/2014 0440   VLDL 32  06/01/2014 0440   LDLCALC 48 06/01/2014 0440     Wt Readings from Last 3 Encounters:  09/21/14 264 lb (119.75 kg)  07/11/14 260 lb 12.8 oz (118.298 kg)  06/04/14 266 lb 4.6 oz (120.788 kg)      Other studies Reviewed: Additional studies/ records that were reviewed today include: Dr Elmarie Shiley note    ASSESSMENT AND PLAN:  1.  Syncope, transient AV block Doing well s/p  PPM Normal pacemaker function See Pace Art report No changes today  2. Hypertensive cardiovascular disease Stable No change required today  3. Afib No afib on device interrogation today chads2vasc score is 4.  He is on xarelto and anemia is followed by Dr Dagmar Hait.    Current medicines are reviewed at length with the patient today.   The patient does not have concerns regarding his medicines.  The following changes were made today:  none    Disposition:   Merlin every 3 months, return to see me in 1 year,  Follow-up with Dr Acie Fredrickson as scheduled.   Army Fossa, MD  09/21/2014 3:48 PM     [place] [address] [place] [address] [phone] (office) [phone] (fax)

## 2014-09-26 DIAGNOSIS — I10 Essential (primary) hypertension: Secondary | ICD-10-CM | POA: Diagnosis not present

## 2014-09-26 DIAGNOSIS — I442 Atrioventricular block, complete: Secondary | ICD-10-CM | POA: Diagnosis not present

## 2014-09-26 DIAGNOSIS — E1129 Type 2 diabetes mellitus with other diabetic kidney complication: Secondary | ICD-10-CM | POA: Diagnosis not present

## 2014-09-26 DIAGNOSIS — E039 Hypothyroidism, unspecified: Secondary | ICD-10-CM | POA: Diagnosis not present

## 2014-09-26 DIAGNOSIS — Z125 Encounter for screening for malignant neoplasm of prostate: Secondary | ICD-10-CM | POA: Diagnosis not present

## 2014-09-26 DIAGNOSIS — E538 Deficiency of other specified B group vitamins: Secondary | ICD-10-CM | POA: Diagnosis not present

## 2014-09-27 ENCOUNTER — Telehealth: Payer: Self-pay | Admitting: Cardiovascular Disease

## 2014-09-27 DIAGNOSIS — R040 Epistaxis: Secondary | ICD-10-CM | POA: Diagnosis not present

## 2014-09-27 NOTE — Telephone Encounter (Signed)
New message     Pt c/o medication issue:  1. Name of Medication: xarelto 2. How are you currently taking this medication (dosage and times per day)? 20mg ---1tab daily 3. Are you having a reaction (difficulty breathing--STAT)? no  4. What is your medication issue? Pt had a bad nosebleed at 4am.  EMS came out.  Should he skip a pill for today?

## 2014-09-27 NOTE — Telephone Encounter (Signed)
Spoke with patient who states he had a nose bleed about 0400 today; EMS was called and packing was placed in his nose and bleeding subsided after a while.  Patient states no recurrent bleeding since that time.  Patient wants to know if he can hold his Xarelto dose today.  I spoke with Dr. Acie Fredrickson who is in the office and he advised patient can hold Xarelto today and resume normal dose tomorrow.  I advised patient to call back if more bleeding occurs and/or if he has any questions or concerns.  Patient verbalized understanding and agreement and states he has a check up with his PCP scheduled for next week and is scheduled to have lab work done at that time.

## 2014-09-28 ENCOUNTER — Emergency Department (HOSPITAL_COMMUNITY)
Admission: EM | Admit: 2014-09-28 | Discharge: 2014-09-28 | Disposition: A | Discharge status: Home / Self Care | Payer: Medicare Other | Attending: Emergency Medicine | Admitting: Emergency Medicine

## 2014-09-28 ENCOUNTER — Encounter (HOSPITAL_COMMUNITY): Payer: Self-pay | Admitting: Emergency Medicine

## 2014-09-28 DIAGNOSIS — Z7901 Long term (current) use of anticoagulants: Secondary | ICD-10-CM | POA: Diagnosis not present

## 2014-09-28 DIAGNOSIS — G4730 Sleep apnea, unspecified: Secondary | ICD-10-CM | POA: Diagnosis not present

## 2014-09-28 DIAGNOSIS — Z7952 Long term (current) use of systemic steroids: Secondary | ICD-10-CM | POA: Insufficient documentation

## 2014-09-28 DIAGNOSIS — Z87442 Personal history of urinary calculi: Secondary | ICD-10-CM | POA: Insufficient documentation

## 2014-09-28 DIAGNOSIS — Z862 Personal history of diseases of the blood and blood-forming organs and certain disorders involving the immune mechanism: Secondary | ICD-10-CM | POA: Insufficient documentation

## 2014-09-28 DIAGNOSIS — E785 Hyperlipidemia, unspecified: Secondary | ICD-10-CM | POA: Diagnosis not present

## 2014-09-28 DIAGNOSIS — Z950 Presence of cardiac pacemaker: Secondary | ICD-10-CM | POA: Insufficient documentation

## 2014-09-28 DIAGNOSIS — Z9889 Other specified postprocedural states: Secondary | ICD-10-CM | POA: Insufficient documentation

## 2014-09-28 DIAGNOSIS — I4891 Unspecified atrial fibrillation: Secondary | ICD-10-CM | POA: Diagnosis not present

## 2014-09-28 DIAGNOSIS — Z9981 Dependence on supplemental oxygen: Secondary | ICD-10-CM | POA: Insufficient documentation

## 2014-09-28 DIAGNOSIS — Z85828 Personal history of other malignant neoplasm of skin: Secondary | ICD-10-CM | POA: Insufficient documentation

## 2014-09-28 DIAGNOSIS — R040 Epistaxis: Secondary | ICD-10-CM | POA: Diagnosis not present

## 2014-09-28 DIAGNOSIS — Z87891 Personal history of nicotine dependence: Secondary | ICD-10-CM | POA: Insufficient documentation

## 2014-09-28 DIAGNOSIS — F419 Anxiety disorder, unspecified: Secondary | ICD-10-CM | POA: Insufficient documentation

## 2014-09-28 DIAGNOSIS — M1990 Unspecified osteoarthritis, unspecified site: Secondary | ICD-10-CM | POA: Diagnosis not present

## 2014-09-28 DIAGNOSIS — I10 Essential (primary) hypertension: Secondary | ICD-10-CM | POA: Insufficient documentation

## 2014-09-28 DIAGNOSIS — Z79899 Other long term (current) drug therapy: Secondary | ICD-10-CM | POA: Diagnosis not present

## 2014-09-28 LAB — CBC
HEMATOCRIT: 29.1 % — AB (ref 39.0–52.0)
Hemoglobin: 9.1 g/dL — ABNORMAL LOW (ref 13.0–17.0)
MCH: 29.7 pg (ref 26.0–34.0)
MCHC: 31.3 g/dL (ref 30.0–36.0)
MCV: 95.1 fL (ref 78.0–100.0)
Platelets: 177 10*3/uL (ref 150–400)
RBC: 3.06 MIL/uL — ABNORMAL LOW (ref 4.22–5.81)
RDW: 17.1 % — ABNORMAL HIGH (ref 11.5–15.5)
WBC: 4.2 10*3/uL (ref 4.0–10.5)

## 2014-09-28 LAB — PROTIME-INR
INR: 1.05 (ref 0.00–1.49)
Prothrombin Time: 13.8 seconds (ref 11.6–15.2)

## 2014-09-28 MED ORDER — OXYMETAZOLINE HCL 0.05 % NA SOLN
1.0000 | Freq: Once | NASAL | Status: AC
  Administered 2014-09-28: 1 via NASAL
  Filled 2014-09-28: qty 15

## 2014-09-28 NOTE — Discharge Instructions (Signed)
Nosebleed Nosebleeds can be caused by many conditions, including trauma, infections, polyps, foreign bodies, dry mucous membranes or climate, medicines, and air conditioning. Most nosebleeds occur in the front of the nose. Because of this location, most nosebleeds can be controlled by pinching the nostrils gently and continuously for at least 10 to 20 minutes. The long, continuous pressure allows enough time for the blood to clot. If pressure is released during that 10 to 20 minute time period, the process may have to be started again. The nosebleed may stop by itself or quit with pressure, or it may need concentrated heating (cautery) or pressure from packing. HOME CARE INSTRUCTIONS   If your nose was packed, try to maintain the pack inside until your health care provider removes it. If a gauze pack was used and it starts to fall out, gently replace it or cut the end off. Do not cut if a balloon catheter was used to pack the nose. Otherwise, do not remove unless instructed.  Avoid blowing your nose for 12 hours after treatment. This could dislodge the pack or clot and start the bleeding again.  If the bleeding starts again, sit up and bend forward, gently pinching the front half of your nose continuously for 20 minutes.  If bleeding was caused by dry mucous membranes, use over-the-counter saline nasal spray or gel. This will keep the mucous membranes moist and allow them to heal. If you must use a lubricant, choose the water-soluble variety. Use it only sparingly and not within several hours of lying down.  Do not use petroleum jelly or mineral oil, as these may drip into the lungs and cause serious problems.  Maintain humidity in your home by using less air conditioning or by using a humidifier.  Do not use aspirin or medicines which make bleeding more likely. Your health care provider can give you recommendations on this.  Resume normal activities as you are able, but try to avoid straining,  lifting, or bending at the waist for several days.  If the nosebleeds become recurrent and the cause is unknown, your health care provider may suggest laboratory tests. SEEK MEDICAL CARE IF: You have a fever. SEEK IMMEDIATE MEDICAL CARE IF:   Bleeding recurs and cannot be controlled.  There is unusual bleeding from or bruising on other parts of the body.  Nosebleeds continue.  There is any worsening of the condition which originally brought you in.  You become light-headed, feel faint, become sweaty, or vomit blood. MAKE SURE YOU:   Understand these instructions.  Will watch your condition.  Will get help right away if you are not doing well or get worse. Document Released: 05/21/2005 Document Revised: 12/26/2013 Document Reviewed: 07/12/2009 [facility] Patient Information 2015 [place], [place]. This information is not intended to replace advice given to you by your health care provider. Make sure you discuss any questions you have with your health care provider.  IF YOUR NOSE BEGINS TO BLEED AGAIN, SOAK SOME GAUZE IN AFRIN AND PACK YOU NOSE, THEN APPLY CONTINUED PRESSURE TO THE SOFT PART OF YOUR NOSE FOR NO LESS THAN 30 MINUTES.  Nasal Hygiene The nose has many positive effects on the air you breathe in that you may not be aware of. - Temperature regulation - Filtration and removal of particulate matter - Humidification - Defense against infections There are several things you can do to help keep your nose healthy. Foremost is nasal hygiene. This will help with your nose's natural function and keep it moist  and healthy. Techniques to accomplish this are outlined below. These will help with nasal dryness, nasal crusting, and nose bleeds. They also assist with clearing thick mucus that cause you to blow your nose frequently and may be associated with thick postnasal drip.  1. Use nasal saline daily. You can buy small bottles of this over-the-counter at the drug store or  grocery store. Some brand names are: Ocean, [employer] [place] [employer], Loyal. Apply 2-3 sprays each nostril several times a day. If your nose feels dry or have had recent nasal surgery, try to use it every couple of hours. There is no medicine in it so it can be used as often as you like. Do NOT use the sprays containing decongestants. The appropriate way to apply nasal sprays: Place the nozzle just inside your nostril and point it towards the corner of your eye. Often, it is helpful to use the right-hand to spray into the left nasal cavity and use the left-hand to spray into the right side.  2. Use nasal saline irrigations and flushing.SinuCleanse, Simply Saline, Ayr. Use this 1-2 times a day. We can also provide you with a recipe to use at home. Just ask [place]. To prevent reintroducing bacteria back into your nose, please keep your irrigation equipment clean and dry between uses. Throw away and replace reusable irrigation equipment every 3 weeks.   3. Use Vaseline petroleum jelly or Aquaphor. You can apply this gently to each nostril 2-3 times a day to promote moisturization for your nose. You may also use triple antibiotic ointment such as Neosporin or Bacitracin. These can all be bought over-the-counter.  4. Consider other nasal emollients. A few preparations are available over-thecounter. Ponaris, Nose Better, Pretz. Ask your pharmacist what is available. Also, some nasal saline sprays have additives such as aloe and these are helpful.  5. Consider using a humidifier at home. If your nose feels dry and/or you have frequent nose bleeds, you can buy a humidifier for your home. Be cautious in using these if you have mold allergies.  6. Avoid excessive manual manipulation of your nose and nostrils. Frequent rubbing of your nostrils and the passing of tissues or fingers in your nostrils may aggravate nasal irritation from dryness and nose bleeds.

## 2014-09-28 NOTE — ED Provider Notes (Signed)
CSN: [id number]     Arrival date & time 09/28/14  1855 History   First MD Initiated Contact with Patient 09/28/14 2027     Chief Complaint  Patient presents with  . Epistaxis     (Consider location/radiation/quality/duration/timing/severity/associated sxs/prior Treatment) Patient is a 81 y.o. male presenting with nosebleeds. The history is provided by the patient and the spouse.  Epistaxis Location:  R nare Duration:  1 hour Timing:  Intermittent Progression:  Unchanged Chronicity:  New Context: anticoagulants (xarelto)   Context: not aspirin use, not BiPAP, not bleeding disorder, not CPAP, not drug use, not elevation change, not foreign body, not home oxygen, not hypertension, not nose picking, not recent infection, not thrombocytopenia, not trauma and not weather change   Relieved by:  Applying pressure and vasoconstrictors Associated symptoms: blood in oropharynx   Associated symptoms: no congestion, no cough, no dizziness, no facial pain, no fever, no headaches, no sinus pain, no sneezing, no sore throat and no syncope     Past Medical History  Diagnosis Date  . Hypertension   . Anxiety   . Microcytic anemia   . Hiatal hernia   . Skin cancer of lip   . Sinus drainage   . OSA on CPAP   . Type II diabetes mellitus   . GERD (gastroesophageal reflux disease)   . Arthritis   . Nephrolithiasis   . On home oxygen therapy     a. 2L w/CPAP at night  . Rocky Mountain spotted fever ~ 1945  . LBBB (left bundle branch block)   . Hyperlipidemia   . Atrial fibrillation     a. Dx 03/2013, notes report atrial fibrillation/atrial flutter, placed on amiodarone. NSR in subsequent OV's.  . Chest pain     a. H/o CTA negative for PE 2012, normal cath 2005, normal nucs previously including 05/2012.  . Orthostatic hypotension   . B12 deficiency anemia   . Complete heart block     a. s/p Medtronic Adapta L model ADDRL 1 (serial number [id number]) pacemaker.   Past Surgical History    Procedure Laterality Date  . Vasectomy      Hx of   . Lumbar disc surgery  ~ 1993  . Inguinal hernia repair Right   . Cardiac catheterization      by Dr. Acie Fredrickson, January 24, 2004, that shows minimal coronary artery irregularities and normal left ventricular function  . Cataract extraction w/ intraocular lens  implant, bilateral Bilateral   . Skin cancer excision      "lower lip" (04/06/2013)  . Temporary pacemaker insertion N/A 05/31/2014    Procedure: TEMPORARY WIRE;  Surgeon: Sinclair Grooms, MD;  Location: [facility] CATH LAB;  Service: Cardiovascular;  Laterality: N/A;  . Permanent pacemaker insertion N/A 06/03/2014    MDT Adapta L implanted by Dr Rayann Heman for syncope and transient AV block   Family History  Problem Relation Age of Onset  . Other      Parents both died of old age; other conditions not known   History  Substance Use Topics  . Smoking status: Former Smoker -- 0.00 packs/day for 20 years    Types: Cigarettes  . Smokeless tobacco: Never Used     Comment: 04/06/2013 "quit smoking years ago; didn't smoke that much; probably 20 years; 1ppd"  . Alcohol Use: No    Review of Systems  Constitutional: Negative.  Negative for fever.  HENT: Positive for nosebleeds. Negative for congestion, sneezing and sore throat.  Eyes: Negative.   Respiratory: Negative for cough.   Cardiovascular: Negative.  Negative for syncope.  Gastrointestinal: Negative.   Endocrine: Negative.   Genitourinary: Negative.   Musculoskeletal: Negative.   Skin: Negative.   Allergic/Immunologic: Negative.   Neurological: Negative for dizziness and headaches.  Hematological: Bruises/bleeds easily.  Psychiatric/Behavioral: Negative.   All other systems reviewed and are negative.     Allergies  Codeine and Citalopram  Home Medications   Prior to Admission medications   Medication Sig Start Date End Date Taking? Authorizing Provider  acetaminophen (TYLENOL) 500 MG tablet Take 500 mg by mouth every 6  (six) hours as needed for mild pain.    Historical Provider, MD  amiodarone (PACERONE) 200 MG tablet Take 1 tablet (200 mg) on Monday, Wednesday, Friday only Patient taking differently: Take 1 tablet (200 mg) by mouth on Monday, Wednesday, Friday only 01/11/14   Thayer Headings, MD  amLODipine (NORVASC) 5 MG tablet TAKE 1 TABLET (5 MG TOTAL) BY MOUTH DAILY. 07/21/14   Thayer Headings, MD  benazepril (LOTENSIN) 40 MG tablet Take 40 mg by mouth daily.      Historical Provider, MD  Cyanocobalamin (VITAMIN B-12 IJ) Inject as directed every 30 (thirty) days.     Historical Provider, MD  diazepam (VALIUM) 5 MG tablet Take 5 mg by mouth at bedtime.      Historical Provider, MD  ferrous sulfate 325 (65 FE) MG tablet Take 1 tablet (325 mg total) by mouth daily. 03/30/14   Wandra Arthurs, MD  fluticasone (FLONASE) 50 MCG/ACT nasal spray Place 1 spray into the nose daily.      Historical Provider, MD  metFORMIN (GLUCOPHAGE) 1000 MG tablet Take 1,000 mg by mouth 2 (two) times daily with a meal.    Historical Provider, MD  metoprolol (LOPRESSOR) 50 MG tablet TAKE 1 TABLET (50 MG TOTAL) BY MOUTH 2 (TWO) TIMES DAILY. 08/22/14   Thayer Headings, MD  NON FORMULARY CPAP @@ night with Oxygen    Historical Provider, MD  omeprazole (PRILOSEC) 20 MG capsule Take 20 mg by mouth daily.     Historical Provider, MD  pioglitazone (ACTOS) 30 MG tablet Take 30 mg by mouth daily.      Historical Provider, MD  rivaroxaban (XARELTO) 20 MG TABS tablet Take 1 tablet (20 mg total) by mouth daily with supper. 07/11/14   Thayer Headings, MD  sertraline (ZOLOFT) 100 MG tablet Take 1 tablet by mouth daily. 08/21/14   Historical Provider, MD   BP 164/78 mmHg  Pulse 63  Temp(Src) 97.4 F (36.3 C)  Resp 18  SpO2 95% Physical Exam  Constitutional: He appears well-developed and well-nourished. No distress.  HENT:  Head: Normocephalic and atraumatic.  Nose: Epistaxis is observed.    Eyes: Conjunctivae are normal. No scleral icterus.    Neck: Normal range of motion. Neck supple.  Cardiovascular: Normal rate, regular rhythm and normal heart sounds.   Pulmonary/Chest: Effort normal and breath sounds normal. No respiratory distress.  Abdominal: Soft. There is no tenderness.  Musculoskeletal: He exhibits no edema.  Neurological: He is alert.  Skin: Skin is warm and dry. He is not diaphoretic.  Psychiatric: His behavior is normal.  Nursing note and vitals reviewed.   ED Course  Procedures (including critical care time) Labs Review Labs Reviewed - No data to display  Imaging Review No results found.   EKG Interpretation None      MDM   Final diagnoses:  Epistaxis  Filed Vitals:   09/28/14 2130 09/28/14 2145 09/28/14 2200 09/28/14 2204  BP: 159/65 134/60 175/83   Pulse: 60 63 62   Temp:    98.3 F (36.8 C)  TempSrc:    Oral  Resp:      SpO2: 95% 94% 96%    Patient with epistaxis, on Xarelto. The patient presented to the emergency department without active bleeding. The patient was given gauze soaked in Afrin and was placed in the bilateral naris and pressure applied for about 30 minutes. Patient had no repeat in his bleeding. His lab work appears stable without significant drop in his hemoglobin.the patient is seen in shared visit with Dr. Darl Householder. The patient will be discharged home with Afrin and clear instructions on what to do if epistaxis should recur as well as  He is a hygiene information. The patient is to follow-up with the ear, nose and throat doctor on call tomorrow for further evaluation. He appears stable for discharge at this time. I have discussed reasons to seek immediate medical care.    Margarita Mail, PA-C 09/29/14 [place] Yao, MD 10/01/14 2008

## 2014-09-28 NOTE — ED Notes (Signed)
Pt. reports intermittent right nare epistaxis onset 3 days ago , denies injury / respirations unlabored . No bleeding at triage .

## 2014-09-29 DIAGNOSIS — H6123 Impacted cerumen, bilateral: Secondary | ICD-10-CM | POA: Diagnosis not present

## 2014-09-29 DIAGNOSIS — R040 Epistaxis: Secondary | ICD-10-CM | POA: Diagnosis not present

## 2014-10-03 DIAGNOSIS — N183 Chronic kidney disease, stage 3 (moderate): Secondary | ICD-10-CM | POA: Diagnosis not present

## 2014-10-03 DIAGNOSIS — Z1389 Encounter for screening for other disorder: Secondary | ICD-10-CM | POA: Diagnosis not present

## 2014-10-03 DIAGNOSIS — I872 Venous insufficiency (chronic) (peripheral): Secondary | ICD-10-CM | POA: Diagnosis not present

## 2014-10-03 DIAGNOSIS — Z008 Encounter for other general examination: Secondary | ICD-10-CM | POA: Diagnosis not present

## 2014-10-03 DIAGNOSIS — E538 Deficiency of other specified B group vitamins: Secondary | ICD-10-CM | POA: Diagnosis not present

## 2014-10-03 DIAGNOSIS — R531 Weakness: Secondary | ICD-10-CM | POA: Diagnosis not present

## 2014-10-03 DIAGNOSIS — Z6833 Body mass index (BMI) 33.0-33.9, adult: Secondary | ICD-10-CM | POA: Diagnosis not present

## 2014-10-03 DIAGNOSIS — I480 Paroxysmal atrial fibrillation: Secondary | ICD-10-CM | POA: Diagnosis not present

## 2014-10-03 DIAGNOSIS — K219 Gastro-esophageal reflux disease without esophagitis: Secondary | ICD-10-CM | POA: Diagnosis not present

## 2014-10-03 DIAGNOSIS — I442 Atrioventricular block, complete: Secondary | ICD-10-CM | POA: Diagnosis not present

## 2014-10-03 DIAGNOSIS — D649 Anemia, unspecified: Secondary | ICD-10-CM | POA: Diagnosis not present

## 2014-10-03 DIAGNOSIS — Z23 Encounter for immunization: Secondary | ICD-10-CM | POA: Diagnosis not present

## 2014-10-04 DIAGNOSIS — Z1212 Encounter for screening for malignant neoplasm of rectum: Secondary | ICD-10-CM | POA: Diagnosis not present

## 2014-10-09 ENCOUNTER — Encounter: Payer: Medicare Other | Admitting: Internal Medicine

## 2014-10-18 ENCOUNTER — Telehealth: Payer: Self-pay | Admitting: Internal Medicine

## 2014-10-18 NOTE — Telephone Encounter (Signed)
Spoke w/ pt wife and informed her that wirex should arrive by 11-06-14. She verbalized understanding.

## 2014-10-18 NOTE — Telephone Encounter (Signed)
Follow up:    Pt's wife called and would like a call back by 5:45 pm today or she will call back 10/19/14.  Pt's wife has questions about the device pt has.    Please give her a call back.

## 2014-11-07 DIAGNOSIS — R05 Cough: Secondary | ICD-10-CM | POA: Diagnosis not present

## 2014-11-07 DIAGNOSIS — Z6832 Body mass index (BMI) 32.0-32.9, adult: Secondary | ICD-10-CM | POA: Diagnosis not present

## 2014-11-07 DIAGNOSIS — J309 Allergic rhinitis, unspecified: Secondary | ICD-10-CM | POA: Diagnosis not present

## 2014-11-07 DIAGNOSIS — I10 Essential (primary) hypertension: Secondary | ICD-10-CM | POA: Diagnosis not present

## 2014-11-07 DIAGNOSIS — E538 Deficiency of other specified B group vitamins: Secondary | ICD-10-CM | POA: Diagnosis not present

## 2014-11-07 DIAGNOSIS — D649 Anemia, unspecified: Secondary | ICD-10-CM | POA: Diagnosis not present

## 2014-11-07 DIAGNOSIS — E119 Type 2 diabetes mellitus without complications: Secondary | ICD-10-CM | POA: Diagnosis not present

## 2014-12-08 ENCOUNTER — Other Ambulatory Visit: Payer: Self-pay | Admitting: Cardiovascular Disease

## 2014-12-12 DIAGNOSIS — D649 Anemia, unspecified: Secondary | ICD-10-CM | POA: Diagnosis not present

## 2014-12-21 ENCOUNTER — Ambulatory Visit (INDEPENDENT_AMBULATORY_CARE_PROVIDER_SITE_OTHER): Payer: Medicare Other | Admitting: *Deleted

## 2014-12-21 DIAGNOSIS — I442 Atrioventricular block, complete: Secondary | ICD-10-CM

## 2014-12-21 NOTE — Progress Notes (Signed)
Remote pacemaker transmission.   

## 2014-12-25 DIAGNOSIS — I10 Essential (primary) hypertension: Secondary | ICD-10-CM | POA: Diagnosis not present

## 2014-12-25 DIAGNOSIS — E119 Type 2 diabetes mellitus without complications: Secondary | ICD-10-CM | POA: Diagnosis not present

## 2014-12-25 DIAGNOSIS — D649 Anemia, unspecified: Secondary | ICD-10-CM | POA: Diagnosis not present

## 2014-12-25 DIAGNOSIS — E039 Hypothyroidism, unspecified: Secondary | ICD-10-CM | POA: Diagnosis not present

## 2014-12-29 LAB — CUP PACEART REMOTE DEVICE CHECK
Battery Voltage: 2.8 V
Brady Statistic AP VP Percent: 0 %
Brady Statistic AP VS Percent: 5 %
Brady Statistic AS VP Percent: 9 %
Brady Statistic AS VS Percent: 86 %
Date Time Interrogation Session: 20160428120202
Lead Channel Impedance Value: 484 Ohm
Lead Channel Pacing Threshold Pulse Width: 0.4 ms
Lead Channel Pacing Threshold Pulse Width: 0.4 ms
Lead Channel Sensing Intrinsic Amplitude: 0.7 mV
Lead Channel Sensing Intrinsic Amplitude: 16 mV
Lead Channel Setting Pacing Amplitude: 2 V
Lead Channel Setting Pacing Amplitude: 2.5 V
Lead Channel Setting Sensing Sensitivity: 5.6 mV
MDC IDC MSMT BATTERY IMPEDANCE: 100 Ohm
MDC IDC MSMT BATTERY REMAINING LONGEVITY: 175 mo
MDC IDC MSMT LEADCHNL RA PACING THRESHOLD AMPLITUDE: 0.875 V
MDC IDC MSMT LEADCHNL RV IMPEDANCE VALUE: 813 Ohm
MDC IDC MSMT LEADCHNL RV PACING THRESHOLD AMPLITUDE: 0.625 V
MDC IDC SET LEADCHNL RV PACING PULSEWIDTH: 0.4 ms

## 2015-01-04 ENCOUNTER — Encounter: Payer: Self-pay | Admitting: Cardiology

## 2015-01-08 DIAGNOSIS — Z9181 History of falling: Secondary | ICD-10-CM | POA: Diagnosis not present

## 2015-01-08 DIAGNOSIS — N183 Chronic kidney disease, stage 3 (moderate): Secondary | ICD-10-CM | POA: Diagnosis not present

## 2015-01-08 DIAGNOSIS — I10 Essential (primary) hypertension: Secondary | ICD-10-CM | POA: Diagnosis not present

## 2015-01-08 DIAGNOSIS — Z6832 Body mass index (BMI) 32.0-32.9, adult: Secondary | ICD-10-CM | POA: Diagnosis not present

## 2015-01-08 DIAGNOSIS — E119 Type 2 diabetes mellitus without complications: Secondary | ICD-10-CM | POA: Diagnosis not present

## 2015-01-08 DIAGNOSIS — G4733 Obstructive sleep apnea (adult) (pediatric): Secondary | ICD-10-CM | POA: Diagnosis not present

## 2015-01-08 DIAGNOSIS — D649 Anemia, unspecified: Secondary | ICD-10-CM | POA: Diagnosis not present

## 2015-01-08 DIAGNOSIS — E039 Hypothyroidism, unspecified: Secondary | ICD-10-CM | POA: Diagnosis not present

## 2015-01-10 ENCOUNTER — Encounter: Payer: Self-pay | Admitting: Internal Medicine

## 2015-01-17 ENCOUNTER — Encounter: Payer: Self-pay | Admitting: Cardiovascular Disease

## 2015-01-17 ENCOUNTER — Ambulatory Visit (INDEPENDENT_AMBULATORY_CARE_PROVIDER_SITE_OTHER): Payer: Medicare Other | Admitting: Cardiovascular Disease

## 2015-01-17 VITALS — BP 146/80 | HR 73 | Ht 75.5 in | Wt 260.4 lb

## 2015-01-17 DIAGNOSIS — D649 Anemia, unspecified: Secondary | ICD-10-CM | POA: Diagnosis not present

## 2015-01-17 DIAGNOSIS — I480 Paroxysmal atrial fibrillation: Secondary | ICD-10-CM | POA: Diagnosis not present

## 2015-01-17 DIAGNOSIS — I10 Essential (primary) hypertension: Secondary | ICD-10-CM

## 2015-01-17 NOTE — Progress Notes (Signed)
Cardiology Office Note   Date:  01/17/2015   ID:  [name], DOB 1930/08/26, MRN [id number]  PCP:  Tivis Ringer, MD  Cardiologist:   Thayer Headings, MD   Chief Complaint  Patient presents with  . Atrial Fibrillation    Problem List 1. Atrial fib 2. Essential Hypertension 3. Microcytic anemia  4. Pacer 5, diabetes Mellitus    Jan 17, 2015:  Richard [name] is a 81 y.o. male who presents for  His atrial fib Having lots of problems with anemia.  No blood in stool Being set up to see a hematologist  Still eating salt frequently     Past Medical History  Diagnosis Date  . Hypertension   . Anxiety   . Microcytic anemia   . Hiatal hernia   . Skin cancer of lip   . Sinus drainage   . OSA on CPAP   . Type II diabetes mellitus   . GERD (gastroesophageal reflux disease)   . Arthritis   . Nephrolithiasis   . On home oxygen therapy     a. 2L w/CPAP at night  . [place] spotted fever ~ 1945  . LBBB (left bundle branch block)   . Hyperlipidemia   . Atrial fibrillation     a. Dx 03/2013, notes report atrial fibrillation/atrial flutter, placed on amiodarone. NSR in subsequent OV's.  . Chest pain     a. H/o CTA negative for PE 2012, normal cath 2005, normal nucs previously including 05/2012.  . Orthostatic hypotension   . B12 deficiency anemia   . Complete heart block     a. s/p Medtronic Adapta L model ADDRL 1 (serial number [id number]) pacemaker.    Past Surgical History  Procedure Laterality Date  . Vasectomy      Hx of   . Lumbar disc surgery  ~ 1993  . Inguinal hernia repair Right   . Cardiac catheterization      by Dr. Acie Fredrickson, January 24, 2004, that shows minimal coronary artery irregularities and normal left ventricular function  . Cataract extraction w/ intraocular lens  implant, bilateral Bilateral   . Skin cancer excision      "lower lip" (04/06/2013)  . Temporary pacemaker insertion N/A 05/31/2014    Procedure: TEMPORARY WIRE;  Surgeon:  Sinclair Grooms, MD;  Location: [facility] CATH LAB;  Service: Cardiovascular;  Laterality: N/A;  . Permanent pacemaker insertion N/A 06/03/2014    MDT Adapta L implanted by Dr Rayann Heman for syncope and transient AV block     Current Outpatient Prescriptions  Medication Sig Dispense Refill  . acetaminophen (TYLENOL) 500 MG tablet Take 500 mg by mouth every 6 (six) hours as needed for mild pain.    Marland Kitchen amiodarone (PACERONE) 200 MG tablet Take 1 tablet (200 mg) on Monday, Wednesday, Friday only (Patient taking differently: Take 1 tablet (200 mg) by mouth on Monday, Wednesday, Friday only) 90 tablet 4  . amLODipine (NORVASC) 5 MG tablet TAKE 1 TABLET (5 MG TOTAL) BY MOUTH DAILY. 90 tablet 1  . benazepril (LOTENSIN) 40 MG tablet Take 40 mg by mouth daily.      . Cyanocobalamin (VITAMIN B-12 IJ) Inject as directed every 30 (thirty) days.     . diazepam (VALIUM) 5 MG tablet Take 5 mg by mouth at bedtime.      . ferrous sulfate 325 (65 FE) MG tablet Take 1 tablet (325 mg total) by mouth daily. 30 tablet 0  . fluticasone (FLONASE)  50 MCG/ACT nasal spray Place 1 spray into the nose daily.      . metFORMIN (GLUCOPHAGE) 1000 MG tablet Take 1,000 mg by mouth 2 (two) times daily with a meal.    . metoprolol (LOPRESSOR) 50 MG tablet TAKE 1 TABLET (50 MG TOTAL) BY MOUTH 2 (TWO) TIMES DAILY. 180 tablet 1  . NON FORMULARY CPAP @@ night with Oxygen    . omeprazole (PRILOSEC) 20 MG capsule Take 20 mg by mouth daily.     . pioglitazone (ACTOS) 30 MG tablet Take 30 mg by mouth daily.      . rivaroxaban (XARELTO) 20 MG TABS tablet Take 1 tablet (20 mg total) by mouth daily with supper. 30 tablet 11  . sertraline (ZOLOFT) 100 MG tablet Take 1 tablet by mouth daily.     No current facility-administered medications for this visit.    Allergies:   Codeine and Citalopram    Social History:  The patient  reports that he has quit smoking. His smoking use included Cigarettes. He smoked 0.00 packs per day for 20 years. He has  never used smokeless tobacco. He reports that he does not drink alcohol or use illicit drugs.   Family History:  The patient's family history includes Other in an other family member.    ROS:  Please see the history of present illness.    Review of Systems: Constitutional:  denies fever, chills, diaphoresis, appetite change and fatigue.  HEENT: denies photophobia, eye pain, redness, hearing loss, ear pain, congestion, sore throat, rhinorrhea, sneezing, neck pain, neck stiffness and tinnitus.  Respiratory: denies SOB, DOE, cough, chest tightness, and wheezing.  Cardiovascular: denies chest pain, palpitations and leg swelling.  Gastrointestinal: denies nausea, vomiting, abdominal pain, diarrhea, constipation, blood in stool.  Genitourinary: denies dysuria, urgency, frequency, hematuria, flank pain and difficulty urinating.  Musculoskeletal: denies  myalgias, back pain, joint swelling, arthralgias and gait problem.   Skin: denies pallor, rash and wound.  Neurological: denies dizziness, seizures, syncope, weakness, light-headedness, numbness and headaches.   Hematological: denies adenopathy, easy bruising, personal or family bleeding history.  Psychiatric/ Behavioral: denies suicidal ideation, mood changes, confusion, nervousness, sleep disturbance and agitation.       All other systems are reviewed and negative.    PHYSICAL EXAM: VS:  BP 146/80 mmHg  Pulse 73  Ht 6' 3.5" (1.918 m)  Wt 118.117 kg (260 lb 6.4 oz)  BMI 32.11 kg/m2  SpO2 97% , BMI Body mass index is 32.11 kg/(m^2). GEN: Well nourished, well developed, in no acute distress HEENT: normal Neck: no JVD, carotid bruits, or masses Cardiac: RRR; no murmurs, rubs, or gallops,no edema  Respiratory:  clear to auscultation bilaterally, normal work of breathing GI: soft, nontender, nondistended, + BS MS: no deformity or atrophy Skin: warm and dry, no rash Neuro:  Strength and sensation are intact Psych: normal   EKG:  EKG  is not ordered today.    Recent Labs: 03/30/2014: Pro B Natriuretic peptide (BNP) 841.9* 05/31/2014: TSH 3.510 06/01/2014: ALT 24 06/03/2014: Magnesium 1.8 06/04/2014: BUN 14; Creatinine 1.28; Potassium 3.9; Sodium 138 09/28/2014: Hemoglobin 9.1*; Platelets 177    Lipid Panel    Component Value Date/Time   CHOL 111 06/01/2014 0440   TRIG 159* 06/01/2014 0440   HDL 31* 06/01/2014 0440   CHOLHDL 3.6 06/01/2014 0440   VLDL 32 06/01/2014 0440   LDLCALC 48 06/01/2014 0440      Wt Readings from Last 3 Encounters:  01/17/15 118.117 kg (260 lb 6.4  oz)  09/21/14 119.75 kg (264 lb)  07/11/14 118.298 kg (260 lb 12.8 oz)      Other studies Reviewed: Additional studies/ records that were reviewed today include: . Review of the above records demonstrates:    ASSESSMENT AND PLAN:  1. Atrial fib- his rate is well-controlled. He now has a pacemaker. He is currently on Xarelto. He's having problems with anemia. I offered to change him to L at this if his oncologist thinks that that might help. So far his stool negative for blood.  2. Essential Hypertension - blood pressure is a little elevated. He still eats a fair amount of salt. I encouraged him to decrease his salt intake.  3. Microcytic anemia  - we'll be seeing a hematologist soon. 4. Pacer - followed by Dr. Rayann Heman   5, diabetes Mellitus  - followed by his medical doctor    Current medicines are reviewed at length with the patient today.  The patient does not have concerns regarding medicines.  The following changes have been made:  no change  Labs/ tests ordered today include:  No orders of the defined types were placed in this encounter.     Disposition:   FU with me in 6 months      Nahser, Wonda Cheng, MD  01/17/2015 3:31 PM    [facility] [place], [place], [place]  [address] Phone: [phone]; Fax: [phone]   [facility]  [address] [place], [place]   [address] [phone]    Fax [phone]

## 2015-01-17 NOTE — Patient Instructions (Signed)
Medication Instructions:  Your physician recommends that you continue on your current medications as directed. Please refer to the Current Medication list given to you today.   Labwork: None Ordered   Testing/Procedures: None Ordered   Follow-Up: Your physician wants you to follow-up in: 6 months with Dr. Nahser.  You will receive a reminder letter in the mail two months in advance. If you don't receive a letter, please call our office to schedule the follow-up appointment.     

## 2015-01-18 ENCOUNTER — Other Ambulatory Visit: Payer: Self-pay | Admitting: Cardiovascular Disease

## 2015-01-24 ENCOUNTER — Telehealth: Payer: Self-pay | Admitting: Internal Medicine

## 2015-01-24 NOTE — Telephone Encounter (Signed)
NEW PATIENT APPT-S/W CHANDRA AND GAVE NP APPT FOR 06/14 @ 1:45 W/DR. MOHAMED

## 2015-01-29 ENCOUNTER — Ambulatory Visit (HOSPITAL_COMMUNITY): Payer: Medicare Other | Admitting: Oncology

## 2015-02-05 ENCOUNTER — Encounter (HOSPITAL_COMMUNITY): Payer: Medicare Other | Attending: Hematology & Oncology | Admitting: Hematology & Oncology

## 2015-02-05 VITALS — BP 149/61 | HR 64 | Temp 97.7°F | Resp 16 | Wt 257.0 lb

## 2015-02-05 DIAGNOSIS — R5383 Other fatigue: Secondary | ICD-10-CM | POA: Diagnosis not present

## 2015-02-05 DIAGNOSIS — D508 Other iron deficiency anemias: Secondary | ICD-10-CM | POA: Diagnosis not present

## 2015-02-05 DIAGNOSIS — H04129 Dry eye syndrome of unspecified lacrimal gland: Secondary | ICD-10-CM | POA: Diagnosis not present

## 2015-02-05 DIAGNOSIS — D649 Anemia, unspecified: Secondary | ICD-10-CM | POA: Diagnosis not present

## 2015-02-05 DIAGNOSIS — R682 Dry mouth, unspecified: Secondary | ICD-10-CM | POA: Diagnosis not present

## 2015-02-05 DIAGNOSIS — N183 Chronic kidney disease, stage 3 (moderate): Secondary | ICD-10-CM

## 2015-02-05 DIAGNOSIS — H04123 Dry eye syndrome of bilateral lacrimal glands: Secondary | ICD-10-CM

## 2015-02-05 LAB — CBC WITH DIFFERENTIAL/PLATELET
Basophils Absolute: 0 10*3/uL (ref 0.0–0.1)
Basophils Relative: 1 % (ref 0–1)
Eosinophils Absolute: 0.2 10*3/uL (ref 0.0–0.7)
Eosinophils Relative: 4 % (ref 0–5)
HCT: 31.1 % — ABNORMAL LOW (ref 39.0–52.0)
Hemoglobin: 9.3 g/dL — ABNORMAL LOW (ref 13.0–17.0)
LYMPHS PCT: 23 % (ref 12–46)
Lymphs Abs: 1.2 10*3/uL (ref 0.7–4.0)
MCH: 27 pg (ref 26.0–34.0)
MCHC: 29.9 g/dL — AB (ref 30.0–36.0)
MCV: 90.1 fL (ref 78.0–100.0)
Monocytes Absolute: 0.6 10*3/uL (ref 0.1–1.0)
Monocytes Relative: 12 % (ref 3–12)
Neutro Abs: 3.1 10*3/uL (ref 1.7–7.7)
Neutrophils Relative %: 60 % (ref 43–77)
Platelets: 193 10*3/uL (ref 150–400)
RBC: 3.45 MIL/uL — ABNORMAL LOW (ref 4.22–5.81)
RDW: 17.6 % — AB (ref 11.5–15.5)
WBC: 5.1 10*3/uL (ref 4.0–10.5)

## 2015-02-05 LAB — RETICULOCYTES
RBC.: 3.45 MIL/uL — AB (ref 4.22–5.81)
RETIC COUNT ABSOLUTE: 69 10*3/uL (ref 19.0–186.0)
RETIC CT PCT: 2 % (ref 0.4–3.1)

## 2015-02-05 LAB — COMPREHENSIVE METABOLIC PANEL
ALT: 19 U/L (ref 17–63)
AST: 33 U/L (ref 15–41)
Albumin: 3.4 g/dL — ABNORMAL LOW (ref 3.5–5.0)
Alkaline Phosphatase: 68 U/L (ref 38–126)
Anion gap: 9 (ref 5–15)
BUN: 21 mg/dL — ABNORMAL HIGH (ref 6–20)
CHLORIDE: 103 mmol/L (ref 101–111)
CO2: 27 mmol/L (ref 22–32)
Calcium: 8.9 mg/dL (ref 8.9–10.3)
Creatinine, Ser: 1.19 mg/dL (ref 0.61–1.24)
GFR, EST NON AFRICAN AMERICAN: 55 mL/min — AB (ref >60)
GLUCOSE: 136 mg/dL — AB (ref 65–99)
Potassium: 3.9 mmol/L (ref 3.5–5.1)
SODIUM: 139 mmol/L (ref 135–145)
Total Bilirubin: 0.5 mg/dL (ref 0.3–1.2)
Total Protein: 7.6 g/dL (ref 6.5–8.1)

## 2015-02-05 LAB — SEDIMENTATION RATE: SED RATE: 90 mm/h — AB (ref 0–16)

## 2015-02-05 LAB — IRON AND TIBC
Iron: 43 ug/dL — ABNORMAL LOW (ref 45–182)
SATURATION RATIOS: 10 % — AB (ref 17.9–39.5)
TIBC: 420 ug/dL (ref 250–450)
UIBC: 377 ug/dL

## 2015-02-05 LAB — FOLATE: Folate: 24.3 ng/mL (ref >5.9)

## 2015-02-05 LAB — VITAMIN B12: Vitamin B-12: 317 pg/mL (ref 180–914)

## 2015-02-05 LAB — FERRITIN: Ferritin: 16 ng/mL — ABNORMAL LOW (ref 24–336)

## 2015-02-05 LAB — LACTATE DEHYDROGENASE: LDH: 84 U/L — ABNORMAL LOW (ref 98–192)

## 2015-02-05 NOTE — Patient Instructions (Signed)
..  [place] at [facility] Discharge Instructions  RECOMMENDATIONS MADE BY THE CONSULTANT AND ANY TEST RESULTS WILL BE SENT TO YOUR REFERRING PHYSICIAN.  Labs today  Return in a couple of weeks     Thank you for choosing [place] at [facility] to provide your oncology and hematology care.  To afford each patient quality time with our provider, please arrive at least 15 minutes before your scheduled appointment time.    You need to re-schedule your appointment should you arrive 10 or more minutes late.  We strive to give you quality time with our providers, and arriving late affects you and other patients whose appointments are after yours.  Also, if you no show three or more times for appointments you may be dismissed from the clinic at the providers discretion.     Again, thank you for choosing [facility].  Our hope is that these requests will decrease the amount of time that you wait before being seen by our physicians.       _____________________________________________________________  Should you have questions after your visit to [facility], please contact our office at [phone] between the hours of 8:30 a.m. and 4:30 p.m.  Voicemails left after 4:30 p.m. will not be returned until the following business day.  For prescription refill requests, have your pharmacy contact our office.

## 2015-02-05 NOTE — Progress Notes (Signed)
[place] at [place] NOTE  Patient Care Team: Prince Solian, MD as PCP - General (Internal Medicine)  CHIEF COMPLAINTS/PURPOSE OF CONSULTATION:  Anemia  HISTORY OF PRESENTING ILLNESS:  Richard [name] 81 y.o. male is here because of Anemia.  He complains of feeling weak and fatigued and can hardly get up at times, has been feeling like this over 1 year. He sits on his porch everyday and rides his tractor every week, sometimes twice a week an average of 3 acres. He dresses himself everyday.  His wife notes he has been on iron "forever" with no improvement in his anemia. He has had a blood transfusion. He would like more blood transfusions, he says that they make him feel better.   His last C-scope was almost 15 years ago at [place]. He denies black or tarry stool. He denies BRBPR. He notes that iron makes his stool dark. Has never had pill cam  Dry mouth and eyes, says eyes are itching right now.  Has medicine to help with dry eyes He says that he eats regularly, Wife says he has lost his taste for food  MEDICAL HISTORY:  Past Medical History  Diagnosis Date  . Hypertension   . Anxiety   . Microcytic anemia   . Hiatal hernia   . Skin cancer of lip   . Sinus drainage   . OSA on CPAP   . Type II diabetes mellitus   . GERD (gastroesophageal reflux disease)   . Arthritis   . Nephrolithiasis   . On home oxygen therapy     a. 2L w/CPAP at night  . [place] spotted fever ~ 1945  . LBBB (left bundle branch block)   . Hyperlipidemia   . Atrial fibrillation     a. Dx 03/2013, notes report atrial fibrillation/atrial flutter, placed on amiodarone. NSR in subsequent OV's.  . Chest pain     a. H/o CTA negative for PE 2012, normal cath 2005, normal nucs previously including 05/2012.  . Orthostatic hypotension   . B12 deficiency anemia   . Complete heart block     a. s/p Medtronic Adapta L model ADDRL 1 (serial number [id number]) pacemaker.     SURGICAL HISTORY: Past Surgical History  Procedure Laterality Date  . Vasectomy      Hx of   . Lumbar disc surgery  ~ 1993  . Inguinal hernia repair Right   . Cardiac catheterization      by Dr. Acie Fredrickson, January 24, 2004, that shows minimal coronary artery irregularities and normal left ventricular function  . Cataract extraction w/ intraocular lens  implant, bilateral Bilateral   . Skin cancer excision      "lower lip" (04/06/2013)  . Temporary pacemaker insertion N/A 05/31/2014    Procedure: TEMPORARY WIRE;  Surgeon: Sinclair Grooms, MD;  Location: [facility] CATH LAB;  Service: Cardiovascular;  Laterality: N/A;  . Permanent pacemaker insertion N/A 06/03/2014    MDT Adapta L implanted by Dr Rayann Heman for syncope and transient AV block    SOCIAL HISTORY: History   Social History  . Marital Status: Married    Spouse Name: N/A  . Number of Children: N/A  . Years of Education: N/A   Occupational History  . Not on file.   Social History Main Topics  . Smoking status: Former Smoker -- 0.00 packs/day for 20 years    Types: Cigarettes  . Smokeless tobacco: Never Used  Comment: 04/06/2013 "quit smoking years ago; didn't smoke that much; probably 20 years; 1ppd"  . Alcohol Use: No  . Drug Use: No  . Sexual Activity: No   Other Topics Concern  . Not on file   Social History Narrative  2 children. Married for 58 years. 6 grandchildren 3 great grandchildren Ex-smoker, quit over 59 yrs ago  ETOH, none Used to play golf, used to work at [employer]: Family History  Problem Relation Age of Onset  . Other      Parents both died of old age; other conditions not known   indicated that his mother is deceased. He indicated that his father is deceased.    Mother passed at 51 of maybe pnenomia, she was in a nursing home Father passed when he was 11 Has 4 brothers and sister, 3 passed Sister has breast cancer   ALLERGIES:  is allergic to codeine and  citalopram.  MEDICATIONS:  Current Outpatient Prescriptions  Medication Sig Dispense Refill  . acetaminophen (TYLENOL) 500 MG tablet Take 500 mg by mouth every 6 (six) hours as needed for mild pain.    Marland Kitchen amiodarone (PACERONE) 200 MG tablet Take 1 tablet (200 mg) on Monday, Wednesday, Friday only (Patient taking differently: Take 1 tablet (200 mg) by mouth on Monday, Wednesday, Friday only) 90 tablet 4  . amLODipine (NORVASC) 5 MG tablet TAKE 1 TABLET (5 MG TOTAL) BY MOUTH DAILY. 90 tablet 2  . benazepril (LOTENSIN) 40 MG tablet Take 40 mg by mouth daily.      . Cyanocobalamin (VITAMIN B-12 IJ) Inject as directed every 30 (thirty) days.     . diazepam (VALIUM) 5 MG tablet Take 5 mg by mouth at bedtime.      . ferrous sulfate 325 (65 FE) MG tablet Take 1 tablet (325 mg total) by mouth daily. 30 tablet 0  . fluticasone (FLONASE) 50 MCG/ACT nasal spray Place 1 spray into the nose daily.      . metFORMIN (GLUCOPHAGE) 1000 MG tablet Take 1,000 mg by mouth 2 (two) times daily with a meal.    . metoprolol (LOPRESSOR) 50 MG tablet TAKE 1 TABLET (50 MG TOTAL) BY MOUTH 2 (TWO) TIMES DAILY. 180 tablet 1  . NON FORMULARY CPAP @@ night with Oxygen    . omeprazole (PRILOSEC) 20 MG capsule Take 20 mg by mouth daily.     . pioglitazone (ACTOS) 30 MG tablet Take 30 mg by mouth daily.      . rivaroxaban (XARELTO) 20 MG TABS tablet Take 1 tablet (20 mg total) by mouth daily with supper. 30 tablet 11  . sertraline (ZOLOFT) 100 MG tablet Take 1 tablet by mouth daily.     No current facility-administered medications for this visit.    Review of Systems  Constitutional: Positive for weight loss and malaise/fatigue.       Patient can hardly get up at times Has lost 32 lbs over the past 2 years and 3-4 lbs in the last week  HENT: Negative.        Dry mouth Edentulous  Eyes:       Dry eyes, takes medication  Respiratory: Negative.   Cardiovascular: Negative.   Gastrointestinal: Negative.   Genitourinary:        Nocturia  Musculoskeletal: Positive for myalgias and joint pain.  Skin: Negative.   Neurological: Positive for dizziness and weakness.       Improved since pacemaker placed  Endo/Heme/Allergies: Negative.   Psychiatric/Behavioral:  Negative.   All other systems reviewed and are negative.  14 point ROS was done and is otherwise as detailed above or in HPI  PHYSICAL EXAMINATION: ECOG PERFORMANCE STATUS: 1 - Symptomatic but completely ambulatory  Filed Vitals:   02/05/15 1352  BP: 149/61  Pulse: 64  Temp: 97.7 F (36.5 C)  Resp: 16   Filed Weights   02/05/15 1352  Weight: 257 lb (116.574 kg)     Physical Exam  Constitutional: He is oriented to person, place, and time and well-developed, well-nourished, and in no distress.  HENT:  Head: Normocephalic and atraumatic.  Right Ear: External ear normal.  Left Ear: External ear normal.  Nose: Nose normal.  Mouth/Throat: Oropharynx is clear and moist. No oropharyngeal exudate.  Dry mouth  Eyes: Conjunctivae and EOM are normal. Pupils are equal, round, and reactive to light. Right eye exhibits no discharge. Left eye exhibits no discharge. No scleral icterus.  Dry eyes  Neck: Normal range of motion. Neck supple. No tracheal deviation present. No thyromegaly present.  Cardiovascular: Normal rate, regular rhythm, normal heart sounds and intact distal pulses.  Exam reveals no gallop and no friction rub.   No murmur heard. Pacemaker  Pulmonary/Chest: Effort normal and breath sounds normal. He has no wheezes. He has no rales.  Abdominal: Soft. Bowel sounds are normal. He exhibits no distension and no mass. There is no tenderness. There is no rebound and no guarding.  Musculoskeletal: Normal range of motion. He exhibits no edema.  Lymphadenopathy:    He has no cervical adenopathy.  Neurological: He is alert and oriented to person, place, and time. He has normal reflexes. No cranial nerve deficit. Gait normal. Coordination normal.   Skin: Skin is warm and dry. No rash noted.  Psychiatric: Mood, memory, affect and judgment normal.  Nursing note and vitals reviewed.   LABORATORY DATA:  I have reviewed the data as listed Lab Results  Component Value Date   WBC 4.2 09/28/2014   HGB 9.1* 09/28/2014   HCT 29.1* 09/28/2014   MCV 95.1 09/28/2014   PLT 177 09/28/2014   '@LASTCHEM'$ @  RADIOGRAPHIC STUDIES: I have personally reviewed the radiological images as listed and agreed with the findings in the report.  CLINICAL DATA: Pacemaker insertion earlier today. Current history of hypertension, diabetes and atrial fibrillation.  EXAM: CHEST 2 VIEW  COMPARISON: Portable chest x-ray 05/31/2014. Two-view chest x-ray 03/30/2014, 04/06/2013. CTA chest 06/20/2011.  FINDINGS: Lateral image less than optimal as the patient was unable to raise the left arm. Left subclavian dual lead transvenous pacemaker with the lead tips projected over the expected location of the right atrial appendage and right ventricular apex. No evidence of pneumothorax or mediastinal hematoma. Cardiac silhouette moderately enlarged, unchanged. Pulmonary vascularity normal. Large hiatal hernia with chronic scar/atelectasis involving the left lower lobe, unchanged. Mild linear atelectasis in the right lower lobe. Lungs otherwise clear. No localized airspace consolidation. No pleural effusions. No pneumothorax. Degenerative changes throughout the thoracic spine with mild osseous demineralization.  IMPRESSION: 1. Left subclavian dual lead transvenous pacemaker appropriately positioned without acute complicating features. 2. Stable moderate cardiomegaly without evidence of pulmonary edema. 3. Stable large hiatal hernia with chronic scar/atelectasis in the left lower lobe. 4. Mild linear atelectasis in the right lower lobe. No acute cardiopulmonary disease otherwise.   Electronically Signed  By: Evangeline Dakin M.D.  On: 06/04/2014  10:09  ASSESSMENT & PLAN:  Anemia CKD, Stage III Fatigue  I discussed the potential causes of anemia. I advised the  patient and his wife that anemia can be caused from many different things and not just iron deficiency. His wife thought that maybe his anemia was from getting older. I advised her that anemia is never normal. I offered him a bone marrow biopsy as part of his initial evaluation and discuss the risks and benefits of this procedure. He prefers to start with a peripheral evaluation only.  I advised the patient that when his laboratory studies become available if there is anything that needs to be addressed immediately we will notify them. If not he will return in 2 weeks to review the results and make additional recommendations. I advised him that a bone marrow biopsy may be necessary to help [place] make an official diagnosis.   Orders Placed This Encounter  Procedures  . CBC with Differential  . Sedimentation rate  . Pathologist smear review  . Lactate dehydrogenase  . Comprehensive metabolic panel  . IgG, IgA, IgM  . Immunofixation electrophoresis  . Protein electrophoresis, serum  . Kappa/lambda light chains  . Erythropoietin  . Reticulocytes  . Iron and TIBC  . Ferritin  . Vitamin B12  . Folate  . Haptoglobin  . Testosterone, free  . Testosterone  . Rheumatoid factor  All questions were answered. The patient knows to call the clinic with any problems, questions or concerns.   This note was electronically signed.    This document serves as a record of services personally performed by Ancil Linsey, MD. It was created on her behalf by Janace Hoard, a trained medical scribe. The creation of this record is based on the scribe's personal observations and the provider's statements to them. This document has been checked and approved by the attending provider.  I have reviewed the above documentation for accuracy and completeness, and I agree with the  above.  Kelby Fam. Whitney Muse, MD

## 2015-02-06 LAB — PROTEIN ELECTROPHORESIS, SERUM
A/G RATIO SPE: 0.8 (ref 0.7–1.7)
Albumin ELP: 3.2 g/dL (ref 2.9–4.4)
Alpha-1-Globulin: 0.2 g/dL (ref 0.0–0.4)
Alpha-2-Globulin: 1 g/dL (ref 0.4–1.0)
Beta Globulin: 1.3 g/dL (ref 0.7–1.3)
GLOBULIN, TOTAL: 3.8 g/dL (ref 2.2–3.9)
Gamma Globulin: 1.3 g/dL (ref 0.4–1.8)
TOTAL PROTEIN ELP: 7 g/dL (ref 6.0–8.5)

## 2015-02-07 LAB — HAPTOGLOBIN: HAPTOGLOBIN: 195 mg/dL (ref 34–200)

## 2015-02-07 LAB — IMMUNOFIXATION ELECTROPHORESIS
IGA: 506 mg/dL — AB (ref 61–437)
IGG (IMMUNOGLOBIN G), SERUM: 1134 mg/dL (ref 700–1600)
IgM, Serum: 220 mg/dL — ABNORMAL HIGH (ref 15–143)
Total Protein ELP: 6.9 g/dL (ref 6.0–8.5)

## 2015-02-07 LAB — ERYTHROPOIETIN: ERYTHROPOIETIN: 42.3 m[IU]/mL — AB (ref 2.6–18.5)

## 2015-02-07 LAB — RHEUMATOID FACTOR: Rhuematoid fact SerPl-aCnc: 236.9 IU/mL — ABNORMAL HIGH (ref 0.0–13.9)

## 2015-02-07 LAB — KAPPA/LAMBDA LIGHT CHAINS
KAPPA FREE LGHT CHN: 63.85 mg/L — AB (ref 3.30–19.40)
Kappa, lambda light chain ratio: 1.92 — ABNORMAL HIGH (ref 0.26–1.65)
Lambda free light chains: 33.18 mg/L — ABNORMAL HIGH (ref 5.71–26.30)

## 2015-02-07 LAB — IGG, IGA, IGM
IgA: 527 mg/dL — ABNORMAL HIGH (ref 61–437)
IgG (Immunoglobin G), Serum: 1176 mg/dL (ref 700–1600)
IgM, Serum: 224 mg/dL — ABNORMAL HIGH (ref 15–143)

## 2015-02-07 LAB — TESTOSTERONE: Testosterone: 488 ng/dL (ref 348–1197)

## 2015-02-07 LAB — TESTOSTERONE, FREE: TESTOSTERONE FREE: 2.1 pg/mL — AB (ref 6.6–18.1)

## 2015-02-08 LAB — PATHOLOGIST SMEAR REVIEW

## 2015-02-11 ENCOUNTER — Encounter (HOSPITAL_COMMUNITY): Payer: Self-pay | Admitting: Hematology & Oncology

## 2015-02-15 DIAGNOSIS — D649 Anemia, unspecified: Secondary | ICD-10-CM | POA: Diagnosis not present

## 2015-02-20 ENCOUNTER — Encounter (HOSPITAL_COMMUNITY): Payer: Self-pay | Admitting: Lab

## 2015-02-20 ENCOUNTER — Encounter (HOSPITAL_BASED_OUTPATIENT_CLINIC_OR_DEPARTMENT_OTHER): Payer: Medicare Other | Admitting: Hematology & Oncology

## 2015-02-20 ENCOUNTER — Encounter (HOSPITAL_COMMUNITY): Payer: Self-pay | Admitting: Hematology & Oncology

## 2015-02-20 VITALS — BP 135/67 | HR 60 | Temp 98.0°F | Resp 18 | Wt 261.0 lb

## 2015-02-20 DIAGNOSIS — D508 Other iron deficiency anemias: Secondary | ICD-10-CM

## 2015-02-20 NOTE — Progress Notes (Signed)
[place] [facility] [place] NOTE  Patient Care Team: Prince Solian, MD as PCP - General (Internal Medicine)  CHIEF COMPLAINTS/PURPOSE OF CONSULTATION:  Anemia Iron deficiency Elevated ESR, RF  HISTORY OF PRESENTING ILLNESS:  Richard [name] 81 y.o. male is here because of Anemia.  He complains of feeling weak and fatigued and can hardly get up at times, has been feeling like this over 1 year. He sits on his porch everyday and rides his tractor every week, sometimes twice a week an average of 3 acres. He dresses himself everyday.  His wife notes he has been on iron "forever" with no improvement in his anemia. He has had a blood transfusion. He would like more blood transfusions, he says that they make him feel better.   His last C-scope was almost 15 years ago at [place]. He denies black or tarry stool. He denies BRBPR. He notes that iron makes his stool dark. Has never had pill cam  He has never been diagnosed with Rheumatoid Arthrits and has no family history of it  The patient denies joint pain but says that his legs get very weak and he can hardly walk when this happens. He also notes dry mouth and dry eyes.   He can do activities such as ride the tractor or mow the lawn but cannot do any activities that involve him standing or walking. He says that his appetite is fine.  His wife says that he has lost his taste for food.  He is here today to review labs performed at his last visit.  MEDICAL HISTORY:  Past Medical History  Diagnosis Date  . Hypertension   . Anxiety   . Microcytic anemia   . Hiatal hernia   . Skin cancer of lip   . Sinus drainage   . OSA on CPAP   . Type II diabetes mellitus   . GERD (gastroesophageal reflux disease)   . Arthritis   . Nephrolithiasis   . On home oxygen therapy     a. 2L w/CPAP at night  . [place] spotted fever ~ 1945  . LBBB (left bundle branch block)   . Hyperlipidemia   . Atrial fibrillation     a. Dx 03/2013, notes report atrial fibrillation/atrial flutter, placed on amiodarone. NSR in subsequent OV's.  . Chest pain     a. H/o CTA negative for PE 2012, normal cath 2005, normal nucs previously including 05/2012.  . Orthostatic hypotension   . B12 deficiency anemia   . Complete heart block     a. s/p Medtronic Adapta L model ADDRL 1 (serial number [id number]) pacemaker.    SURGICAL HISTORY: Past Surgical History  Procedure Laterality Date  . Vasectomy      Hx of   . Lumbar disc surgery  ~ 1993  . Inguinal hernia repair Right   . Cardiac catheterization      by Dr. Acie Fredrickson, January 24, 2004, that shows minimal coronary artery irregularities and normal left ventricular function  . Cataract extraction w/ intraocular lens  implant, bilateral Bilateral   . Skin cancer excision      "lower lip" (04/06/2013)  . Temporary pacemaker insertion N/A 05/31/2014    Procedure: TEMPORARY WIRE;  Surgeon: Sinclair Grooms, MD;  Location: [facility] CATH LAB;  Service: Cardiovascular;  Laterality: N/A;  . Permanent pacemaker insertion N/A 06/03/2014    MDT Adapta L implanted by Dr Rayann Heman for syncope and transient AV block  SOCIAL HISTORY: History   Social History  . Marital Status: Married    Spouse Name: N/A  . Number of Children: N/A  . Years of Education: N/A   Occupational History  . Not on file.   Social History Main Topics  . Smoking status: Former Smoker -- 0.00 packs/day for 20 years    Types: Cigarettes  . Smokeless tobacco: Never Used     Comment: 04/06/2013 "quit smoking years ago; didn't smoke that much; probably 20 years; 1ppd"  . Alcohol Use: No  . Drug Use: No  . Sexual Activity: No   Other Topics Concern  . Not on file   Social History Narrative  2 children. Married for 58 years. 6 grandchildren 3 great grandchildren Ex-smoker, quit over 30 yrs ago  ETOH, none Used to play golf, used to work at [employer]: Family History  Problem Relation Age of  Onset  . Other      Parents both died of old age; other conditions not known   indicated that his mother is deceased. He indicated that his father is deceased.    Mother passed at 60 of maybe pnenomia, she was in a nursing home Father passed when he was 58 Has 50 brothers and sister, 3 passed Sister has breast cancer   ALLERGIES:  is allergic to codeine and citalopram.  MEDICATIONS:  Current Outpatient Prescriptions  Medication Sig Dispense Refill  . acetaminophen (TYLENOL) 500 MG tablet Take 500 mg by mouth every 6 (six) hours as needed for mild pain.    Marland Kitchen amiodarone (PACERONE) 200 MG tablet Take 1 tablet (200 mg) on Monday, Wednesday, Friday only (Patient taking differently: Take 1 tablet (200 mg) by mouth on Monday, Wednesday, Friday only) 90 tablet 4  . amLODipine (NORVASC) 5 MG tablet TAKE 1 TABLET (5 MG TOTAL) BY MOUTH DAILY. 90 tablet 2  . benazepril (LOTENSIN) 40 MG tablet Take 40 mg by mouth daily.      . Cyanocobalamin (VITAMIN B-12 IJ) Inject as directed every 30 (thirty) days.     . diazepam (VALIUM) 5 MG tablet Take 5 mg by mouth at bedtime.      . ferrous sulfate 325 (65 FE) MG tablet Take 1 tablet (325 mg total) by mouth daily. 30 tablet 0  . fluticasone (FLONASE) 50 MCG/ACT nasal spray Place 1 spray into the nose daily.      . metFORMIN (GLUCOPHAGE) 1000 MG tablet Take 1,000 mg by mouth 2 (two) times daily with a meal.    . metoprolol (LOPRESSOR) 50 MG tablet TAKE 1 TABLET (50 MG TOTAL) BY MOUTH 2 (TWO) TIMES DAILY. 180 tablet 1  . NON FORMULARY CPAP @@ night with Oxygen    . omeprazole (PRILOSEC) 20 MG capsule Take 20 mg by mouth daily.     . pioglitazone (ACTOS) 30 MG tablet Take 30 mg by mouth daily.      . rivaroxaban (XARELTO) 20 MG TABS tablet Take 1 tablet (20 mg total) by mouth daily with supper. 30 tablet 11  . sertraline (ZOLOFT) 100 MG tablet Take 1 tablet by mouth daily.     No current facility-administered medications for this visit.    Review of  Systems  Constitutional: Positive for weight loss and malaise/fatigue.       Patient can hardly get up at times Has lost 32 lbs over the past 2 years and 3-4 lbs in the last week  HENT: Negative.  Dry mouth Edentulous  Eyes:       Dry eyes, takes medication  Respiratory: Negative.   Cardiovascular: Negative.   Gastrointestinal: Negative.   Genitourinary:       Nocturia  Musculoskeletal: Positive for myalgias and joint pain.  Skin: Negative.   Neurological: Positive for dizziness and weakness.       Improved since pacemaker placed  Endo/Heme/Allergies: Negative.   Psychiatric/Behavioral: Negative.   All other systems reviewed and are negative.  14 point ROS was done and is otherwise as detailed above or in HPI    PHYSICAL EXAMINATION: ECOG PERFORMANCE STATUS: 1 - Symptomatic but completely ambulatory  Filed Vitals:   02/20/15 1049  BP: 135/67  Pulse: 60  Temp: 98 F (36.7 C)  Resp: 18   Filed Weights   02/20/15 1049  Weight: 261 lb (118.389 kg)     Physical Exam  Constitutional: He is oriented to person, place, and time and well-developed, well-nourished, and in no distress.  HENT:  Head: Normocephalic and atraumatic.  Right Ear: External ear normal.  Left Ear: External ear normal.  Nose: Nose normal.  Mouth/Throat: Oropharynx is clear and moist. No oropharyngeal exudate.  Dry mouth  Eyes: Conjunctivae and EOM are normal. Pupils are equal, round, and reactive to light. Right eye exhibits no discharge. Left eye exhibits no discharge. No scleral icterus.  Dry eyes  Neck: Normal range of motion. Neck supple. No tracheal deviation present. No thyromegaly present.  Cardiovascular: Normal rate, regular rhythm, normal heart sounds and intact distal pulses.  Exam reveals no gallop and no friction rub.   No murmur heard. Pacemaker  Pulmonary/Chest: Effort normal and breath sounds normal. He has no wheezes. He has no rales.  Abdominal: Soft. Bowel sounds are  normal. He exhibits no distension and no mass. There is no tenderness. There is no rebound and no guarding.  Musculoskeletal: Normal range of motion. He exhibits no edema.  Lymphadenopathy:    He has no cervical adenopathy.  Neurological: He is alert and oriented to person, place, and time. He has normal reflexes. No cranial nerve deficit. Gait normal. Coordination normal.  Skin: Skin is warm and dry. No rash noted.  Psychiatric: Mood, memory, affect and judgment normal.  Nursing note and vitals reviewed.   LABORATORY DATA:  I have reviewed the data as listed Results for Richard, [name] (MRN [id number]) as of 02/20/2015 12:21  Ref. Range 02/05/2015 15:21 02/05/2015 15:21  Sodium Latest Ref Range: 135-145 mmol/L 139   Potassium Latest Ref Range: 3.5-5.1 mmol/L 3.9   Chloride Latest Ref Range: 101-111 mmol/L 103   CO2 Latest Ref Range: 22-32 mmol/L 27   BUN Latest Ref Range: 6-20 mg/dL 21 (H)   Creatinine Latest Ref Range: 0.61-1.24 mg/dL 1.19   Calcium Latest Ref Range: 8.9-10.3 mg/dL 8.9   EGFR (Non-African Amer.) Latest Ref Range: >60 mL/min 55 (L)   EGFR (African American) Latest Ref Range: >60 mL/min >60   Glucose Latest Ref Range: 65-99 mg/dL 136 (H)   Anion gap Latest Ref Range: 5-15  9   Alkaline Phosphatase Latest Ref Range: 38-126 U/L 68   Albumin Latest Ref Range: 3.5-5.0 g/dL 3.4 (L)   Albumin/Globulin Ratio Latest Ref Range: 0.7-1.7  0.8   AST Latest Ref Range: 15-41 U/L 33   ALT Latest Ref Range: 17-63 U/L 19   Total Protein Latest Ref Range: 6.5-8.1 g/dL 7.6   Total Bilirubin Latest Ref Range: 0.3-1.2 mg/dL 0.5   LDH Latest Ref Range: 98-192  U/L 84 (L)   Iron Latest Ref Range: 45-182 ug/dL 43 (L)   UIBC Latest Units: ug/dL 377   TIBC Latest Ref Range: 250-450 ug/dL 420   Saturation Ratios Latest Ref Range: 17.9-39.5 % 10 (L)   Ferritin Latest Ref Range: 24-336 ng/mL 16 (L)   Folate Latest Ref Range: >5.9 ng/mL 24.3   Vitamin B-12 Latest Ref Range: 180-914 pg/mL 317     Total Protein ELP Latest Ref Range: 6.0-8.5 g/dL 7.0 6.9  Albumin ELP Latest Ref Range: 2.9-4.4 g/dL 3.2   Globulin, Total Latest Ref Range: 2.2-3.9 g/dL 3.8   Alpha-1-Globulin Latest Ref Range: 0.0-0.4 g/dL 0.2   Alpha-2-Globulin Latest Ref Range: 0.4-1.0 g/dL 1.0   Beta Globulin Latest Ref Range: 0.7-1.3 g/dL 1.3   Gamma Globulin Latest Ref Range: 0.4-1.8 g/dL 1.3   M-SPIKE, % Latest Ref Range: Not Observed g/dL Not Observed   SPE Interp. Unknown Comment   Comment Unknown Comment   IgG (Immunoglobin G), Serum Latest Ref Range: 661-634-9822 mg/dL 1176 1134  IgA Latest Ref Range: 61-437 mg/dL 527 (H) 506 (H)  IgM, Serum Latest Ref Range: 15-143 mg/dL 224 (H) 220 (H)  Kappa free light chain Latest Ref Range: 3.30-19.40 mg/L 63.85 (H)   Lamda free light chains Latest Ref Range: 5.71-26.30 mg/L 33.18 (H)   Kappa, lamda light chain ratio Latest Ref Range: 0.26-1.65  1.92 (H)   WBC Latest Ref Range: 4.0-10.5 K/uL 5.1   RBC Latest Ref Range: 4.22-5.81 MIL/uL 3.45 (L)   Hemoglobin Latest Ref Range: 13.0-17.0 g/dL 9.3 (L)   HCT Latest Ref Range: 39.0-52.0 % 31.1 (L)   MCV Latest Ref Range: 78.0-100.0 fL 90.1   MCH Latest Ref Range: 26.0-34.0 pg 27.0   MCHC Latest Ref Range: 30.0-36.0 g/dL 29.9 (L)   RDW Latest Ref Range: 11.5-15.5 % 17.6 (H)   Platelets Latest Ref Range: 150-400 K/uL 193   Neutrophils Latest Ref Range: 43-77 % 60   Lymphocytes Latest Ref Range: 12-46 % 23   Monocytes Relative Latest Ref Range: 3-12 % 12   Eosinophil Latest Ref Range: 0-5 % 4   Basophil Latest Ref Range: 0-1 % 1   NEUT# Latest Ref Range: 1.7-7.7 K/uL 3.1   Lymphocyte # Latest Ref Range: 0.7-4.0 K/uL 1.2   Monocyte # Latest Ref Range: 0.1-1.0 K/uL 0.6   Eosinophils Absolute Latest Ref Range: 0.0-0.7 K/uL 0.2   Basophils Absolute Latest Ref Range: 0.0-0.1 K/uL 0.0   RBC. Latest Ref Range: 4.22-5.81 MIL/uL 3.45 (L)   Retic Ct Pct Latest Ref Range: 0.4-3.1 % 2.0   Retic Count, Manual Latest Ref Range:  19.0-186.0 K/uL 69.0   Haptoglobin Latest Ref Range: 34-200 mg/dL 195   Erythropoietin Latest Ref Range: 2.6-18.5 mIU/mL 42.3 (H)   Sed Rate Latest Ref Range: 0-16 mm/hr 90 (H)   Testosterone Latest Ref Range: 559-700-0061 ng/dL 488   Testosterone Free Latest Ref Range: 6.6-18.1 pg/mL 2.1 (L)   Rhuematoid fact SerPl-aCnc Latest Ref Range: 0.0-13.9 IU/mL 236.9 (H)   Immunofixation Result, Serum Unknown  Comment    RADIOGRAPHIC STUDIES: I have personally reviewed the radiological images as listed and agreed with the findings in the report.  CLINICAL DATA: Pacemaker insertion earlier today. Current history of hypertension, diabetes and atrial fibrillation.  EXAM: CHEST 2 VIEW  COMPARISON: Portable chest x-ray 05/31/2014. Two-view chest x-ray 03/30/2014, 04/06/2013. CTA chest 06/20/2011.  FINDINGS: Lateral image less than optimal as the patient was unable to raise the left arm. Left subclavian dual lead transvenous  pacemaker with the lead tips projected over the expected location of the right atrial appendage and right ventricular apex. No evidence of pneumothorax or mediastinal hematoma. Cardiac silhouette moderately enlarged, unchanged. Pulmonary vascularity normal. Large hiatal hernia with chronic scar/atelectasis involving the left lower lobe, unchanged. Mild linear atelectasis in the right lower lobe. Lungs otherwise clear. No localized airspace consolidation. No pleural effusions. No pneumothorax. Degenerative changes throughout the thoracic spine with mild osseous demineralization.  IMPRESSION: 1. Left subclavian dual lead transvenous pacemaker appropriately positioned without acute complicating features. 2. Stable moderate cardiomegaly without evidence of pulmonary edema. 3. Stable large hiatal hernia with chronic scar/atelectasis in the left lower lobe. 4. Mild linear atelectasis in the right lower lobe. No acute cardiopulmonary disease  otherwise.   Electronically Signed  By: Evangeline Dakin M.D.  On: 06/04/2014 10:09  ASSESSMENT & PLAN:  Anemia CKD, Stage III Fatigue Markedly elevated ESR, rheumatoid factor Severe iron deficiency with serum ferritin of 16 NG/ML Last GI evaluation approximately 10-15 years ago Polyclonal gammopathy, abnormal kappa lambda light chain ratio   I have recommended proceeding with IV iron replacement. He has been on oral iron for almost a year with little improvement in his iron levels. He did have IV iron last year and tolerated it well. He discussed with the patient and his wife that depending upon his ongoing iron needs I anticipate he may need a referral back to gastroenterology.  Given his markedly elevated rheumatoid factor, ESR, and polyclonal gammopathy I have recommended a referral to rheumatology for additional evaluation. He has had significant worsening of fatigue and worsening dry mouth and dry eyes over the last year. They are agreeable to be referred to [facility] for a rheumatology appointment.  I will plan on seeing him back in 3 weeks with repeat laboratory studies. We will make additional recommendations regarding ongoing iron replacement at that visit. He will hopefully be able to see rheumatology in the next 4-6 weeks. Off on proceeding with bone marrow biopsy for now given obvious identifiable causes in contributors to his anemia.  Orders Placed This Encounter  Procedures  . CBC with Differential    Standing Status: Future     Number of Occurrences:      Standing Expiration Date: 02/20/2016  . Reticulocytes    Standing Status: Future     Number of Occurrences:      Standing Expiration Date: 02/20/2016  . Ferritin    Standing Status: Future     Number of Occurrences:      Standing Expiration Date: 02/20/2016   All questions were answered. The patient knows to call the clinic with any problems, questions or concerns.   This note was electronically signed.     This document serves as a record of services personally performed by Ancil Linsey, MD. It was created on her behalf by Janace Hoard, a trained medical scribe. The creation of this record is based on the scribe's personal observations and the provider's statements to them. This document has been checked and approved by the attending provider.  I have reviewed the above documentation for accuracy and completeness, and I agree with the above.  Kelby Fam. Whitney Muse, MD

## 2015-02-20 NOTE — Progress Notes (Signed)
Referral sent to [facility].  [phone])   Dr Kandice Robinsons appt 7/5 at 11:30.  Patient is aware of appt

## 2015-02-20 NOTE — Progress Notes (Signed)
Referral sent to Rheumatology.  [facility](  [phone] ) faxed on 6/28.  They will review and let know of appt.

## 2015-02-20 NOTE — Patient Instructions (Signed)
[place] at [facility] Discharge Instructions  RECOMMENDATIONS MADE BY THE CONSULTANT AND ANY TEST RESULTS WILL BE SENT TO YOUR REFERRING PHYSICIAN.  Exam and discussion today with Dr. Whitney Muse. Return as scheduled for Feraheme (iron) infusions. You will receive 2 total doses. A referral has been made to Rheumatology. Their office will call you with appointment date and time. Office visit in 3 weeks with Dr. Whitney Muse.   Thank you for choosing [place] at [facility] to provide your oncology and hematology care.  To afford each patient quality time with our provider, please arrive at least 15 minutes before your scheduled appointment time.    You need to re-schedule your appointment should you arrive 10 or more minutes late.  We strive to give you quality time with our providers, and arriving late affects you and other patients whose appointments are after yours.  Also, if you no show three or more times for appointments you may be dismissed from the clinic at the providers discretion.     Again, thank you for choosing [facility].  Our hope is that these requests will decrease the amount of time that you wait before being seen by our physicians.       _____________________________________________________________  Should you have questions after your visit to [facility], please contact our office at [phone] between the hours of 8:30 a.m. and 4:30 p.m.  Voicemails left after 4:30 p.m. will not be returned until the following business day.  For prescription refill requests, have your pharmacy contact our office.

## 2015-02-23 ENCOUNTER — Encounter (HOSPITAL_COMMUNITY): Payer: Medicare Other | Attending: Hematology & Oncology

## 2015-02-23 VITALS — BP 156/70 | HR 60 | Temp 97.7°F | Resp 18

## 2015-02-23 DIAGNOSIS — H04123 Dry eye syndrome of bilateral lacrimal glands: Secondary | ICD-10-CM | POA: Insufficient documentation

## 2015-02-23 DIAGNOSIS — D509 Iron deficiency anemia, unspecified: Secondary | ICD-10-CM | POA: Diagnosis not present

## 2015-02-23 DIAGNOSIS — D508 Other iron deficiency anemias: Secondary | ICD-10-CM | POA: Insufficient documentation

## 2015-02-23 DIAGNOSIS — N183 Chronic kidney disease, stage 3 (moderate): Secondary | ICD-10-CM | POA: Diagnosis not present

## 2015-02-23 DIAGNOSIS — R682 Dry mouth, unspecified: Secondary | ICD-10-CM | POA: Insufficient documentation

## 2015-02-23 DIAGNOSIS — R5383 Other fatigue: Secondary | ICD-10-CM | POA: Insufficient documentation

## 2015-02-23 MED ORDER — SODIUM CHLORIDE 0.9 % IV SOLN
510.0000 mg | INTRAVENOUS | Status: DC
  Administered 2015-02-23: 510 mg via INTRAVENOUS
  Filled 2015-02-23: qty 17

## 2015-02-23 MED ORDER — SODIUM CHLORIDE 0.9 % IJ SOLN
10.0000 mL | Freq: Once | INTRAMUSCULAR | Status: AC
  Administered 2015-02-23: 10 mL via INTRAVENOUS

## 2015-02-23 MED ORDER — SODIUM CHLORIDE 0.9 % IV SOLN
INTRAVENOUS | Status: DC
  Administered 2015-02-23: 10:00:00 via INTRAVENOUS

## 2015-02-23 NOTE — Progress Notes (Signed)
Tolerated iron infusion well. 

## 2015-02-23 NOTE — Patient Instructions (Signed)
[place] at [facility] Discharge Instructions  RECOMMENDATIONS MADE BY THE CONSULTANT AND ANY TEST RESULTS WILL BE SENT TO YOUR REFERRING PHYSICIAN.  Feraheme 510 mg infusion today as ordered. Return as scheduled.  Thank you for choosing [place] at [facility] to provide your oncology and hematology care.  To afford each patient quality time with our provider, please arrive at least 15 minutes before your scheduled appointment time.    You need to re-schedule your appointment should you arrive 10 or more minutes late.  We strive to give you quality time with our providers, and arriving late affects you and other patients whose appointments are after yours.  Also, if you no show three or more times for appointments you may be dismissed from the clinic at the providers discretion.     Again, thank you for choosing [facility].  Our hope is that these requests will decrease the amount of time that you wait before being seen by our physicians.       _____________________________________________________________  Should you have questions after your visit to [facility], please contact our office at [phone] between the hours of 8:30 a.m. and 4:30 p.m.  Voicemails left after 4:30 p.m. will not be returned until the following business day.  For prescription refill requests, have your pharmacy contact our office.

## 2015-02-27 DIAGNOSIS — R760 Raised antibody titer: Secondary | ICD-10-CM | POA: Diagnosis not present

## 2015-02-27 DIAGNOSIS — M1811 Unilateral primary osteoarthritis of first carpometacarpal joint, right hand: Secondary | ICD-10-CM | POA: Diagnosis not present

## 2015-02-27 DIAGNOSIS — R5383 Other fatigue: Secondary | ICD-10-CM | POA: Diagnosis not present

## 2015-02-27 DIAGNOSIS — R700 Elevated erythrocyte sedimentation rate: Secondary | ICD-10-CM | POA: Diagnosis not present

## 2015-03-01 DIAGNOSIS — Z961 Presence of intraocular lens: Secondary | ICD-10-CM | POA: Diagnosis not present

## 2015-03-01 DIAGNOSIS — H04123 Dry eye syndrome of bilateral lacrimal glands: Secondary | ICD-10-CM | POA: Diagnosis not present

## 2015-03-01 DIAGNOSIS — H26491 Other secondary cataract, right eye: Secondary | ICD-10-CM | POA: Diagnosis not present

## 2015-03-01 DIAGNOSIS — E119 Type 2 diabetes mellitus without complications: Secondary | ICD-10-CM | POA: Diagnosis not present

## 2015-03-02 ENCOUNTER — Ambulatory Visit (HOSPITAL_COMMUNITY): Payer: Medicare Other

## 2015-03-02 ENCOUNTER — Encounter (HOSPITAL_COMMUNITY): Payer: Self-pay

## 2015-03-02 ENCOUNTER — Encounter (HOSPITAL_BASED_OUTPATIENT_CLINIC_OR_DEPARTMENT_OTHER): Payer: Medicare Other

## 2015-03-02 DIAGNOSIS — D649 Anemia, unspecified: Secondary | ICD-10-CM | POA: Diagnosis present

## 2015-03-02 MED ORDER — SODIUM CHLORIDE 0.9 % IV SOLN
INTRAVENOUS | Status: DC
  Administered 2015-03-02: 13:00:00 via INTRAVENOUS

## 2015-03-02 MED ORDER — SODIUM CHLORIDE 0.9 % IV SOLN
510.0000 mg | Freq: Once | INTRAVENOUS | Status: AC
  Administered 2015-03-02: 510 mg via INTRAVENOUS
  Filled 2015-03-02: qty 17

## 2015-03-02 NOTE — Patient Instructions (Signed)
[facility] at [facility] Discharge Instructions  RECOMMENDATIONS MADE BY THE CONSULTANT AND ANY TEST RESULTS WILL BE SENT TO YOUR REFERRING PHYSICIAN.  Iron infusion today Follow up as scheduled Please call the clinic if you have any questions or concerns  Thank you for choosing [facility] at [facility] to provide your oncology and hematology care.  To afford each patient quality time with our provider, please arrive at least 15 minutes before your scheduled appointment time.    You need to re-schedule your appointment should you arrive 10 or more minutes late.  We strive to give you quality time with our providers, and arriving late affects you and other patients whose appointments are after yours.  Also, if you no show three or more times for appointments you may be dismissed from the clinic at the providers discretion.     Again, thank you for choosing [facility].  Our hope is that these requests will decrease the amount of time that you wait before being seen by our physicians.       _____________________________________________________________  Should you have questions after your visit to [facility], please contact our office at [phone] between the hours of 8:30 a.m. and 4:30 p.m.  Voicemails left after 4:30 p.m. will not be returned until the following business day.  For prescription refill requests, have your pharmacy contact our office.    

## 2015-03-02 NOTE — Progress Notes (Signed)
Richard [name] Tolerated iron infusion well discharged ambulatory

## 2015-03-06 ENCOUNTER — Other Ambulatory Visit: Payer: Self-pay | Admitting: Cardiovascular Disease

## 2015-03-13 ENCOUNTER — Encounter (HOSPITAL_COMMUNITY): Payer: Medicare Other | Attending: Hematology & Oncology

## 2015-03-13 ENCOUNTER — Encounter (HOSPITAL_COMMUNITY): Payer: Self-pay | Admitting: Hematology & Oncology

## 2015-03-13 ENCOUNTER — Encounter (HOSPITAL_BASED_OUTPATIENT_CLINIC_OR_DEPARTMENT_OTHER): Payer: Medicare Other | Admitting: Hematology & Oncology

## 2015-03-13 VITALS — BP 127/58 | HR 60 | Temp 97.8°F | Resp 18 | Wt 258.0 lb

## 2015-03-13 DIAGNOSIS — Z87891 Personal history of nicotine dependence: Secondary | ICD-10-CM

## 2015-03-13 DIAGNOSIS — N183 Chronic kidney disease, stage 3 (moderate): Secondary | ICD-10-CM

## 2015-03-13 DIAGNOSIS — R634 Abnormal weight loss: Secondary | ICD-10-CM | POA: Diagnosis not present

## 2015-03-13 DIAGNOSIS — D638 Anemia in other chronic diseases classified elsewhere: Secondary | ICD-10-CM

## 2015-03-13 DIAGNOSIS — R51 Headache: Secondary | ICD-10-CM | POA: Diagnosis not present

## 2015-03-13 DIAGNOSIS — E661 Drug-induced obesity: Secondary | ICD-10-CM | POA: Diagnosis not present

## 2015-03-13 DIAGNOSIS — D472 Monoclonal gammopathy: Secondary | ICD-10-CM

## 2015-03-13 DIAGNOSIS — D649 Anemia, unspecified: Secondary | ICD-10-CM | POA: Diagnosis not present

## 2015-03-13 DIAGNOSIS — D500 Iron deficiency anemia secondary to blood loss (chronic): Secondary | ICD-10-CM

## 2015-03-13 DIAGNOSIS — R5383 Other fatigue: Secondary | ICD-10-CM | POA: Diagnosis not present

## 2015-03-13 DIAGNOSIS — D759 Disease of blood and blood-forming organs, unspecified: Secondary | ICD-10-CM | POA: Diagnosis not present

## 2015-03-13 DIAGNOSIS — D508 Other iron deficiency anemias: Secondary | ICD-10-CM | POA: Diagnosis not present

## 2015-03-13 DIAGNOSIS — D509 Iron deficiency anemia, unspecified: Secondary | ICD-10-CM | POA: Diagnosis not present

## 2015-03-13 DIAGNOSIS — R682 Dry mouth, unspecified: Secondary | ICD-10-CM | POA: Diagnosis not present

## 2015-03-13 DIAGNOSIS — H04123 Dry eye syndrome of bilateral lacrimal glands: Secondary | ICD-10-CM | POA: Diagnosis not present

## 2015-03-13 LAB — CBC WITH DIFFERENTIAL/PLATELET
Basophils Absolute: 0 10*3/uL (ref 0.0–0.1)
Basophils Relative: 1 % (ref 0–1)
Eosinophils Absolute: 0.2 10*3/uL (ref 0.0–0.7)
Eosinophils Relative: 4 % (ref 0–5)
HCT: 30.3 % — ABNORMAL LOW (ref 39.0–52.0)
Hemoglobin: 9.5 g/dL — ABNORMAL LOW (ref 13.0–17.0)
LYMPHS ABS: 0.9 10*3/uL (ref 0.7–4.0)
Lymphocytes Relative: 21 % (ref 12–46)
MCH: 29.8 pg (ref 26.0–34.0)
MCHC: 31.4 g/dL (ref 30.0–36.0)
MCV: 95 fL (ref 78.0–100.0)
Monocytes Absolute: 0.6 10*3/uL (ref 0.1–1.0)
Monocytes Relative: 13 % — ABNORMAL HIGH (ref 3–12)
NEUTROS ABS: 2.5 10*3/uL (ref 1.7–7.7)
Neutrophils Relative %: 61 % (ref 43–77)
PLATELETS: 165 10*3/uL (ref 150–400)
RBC: 3.19 MIL/uL — ABNORMAL LOW (ref 4.22–5.81)
RDW: 21.1 % — ABNORMAL HIGH (ref 11.5–15.5)
WBC: 4.2 10*3/uL (ref 4.0–10.5)

## 2015-03-13 LAB — RETICULOCYTES
RBC.: 3.19 MIL/uL — ABNORMAL LOW (ref 4.22–5.81)
RETIC CT PCT: 2.6 % (ref 0.4–3.1)
Retic Count, Absolute: 82.9 10*3/uL (ref 19.0–186.0)

## 2015-03-13 LAB — FERRITIN: Ferritin: 155 ng/mL (ref 24–336)

## 2015-03-13 NOTE — Patient Instructions (Addendum)
[place] at [facility] Discharge Instructions  RECOMMENDATIONS MADE BY THE CONSULTANT AND ANY TEST RESULTS WILL BE SENT TO YOUR REFERRING PHYSICIAN.  Exam completed by Dr Whitney Muse today Lab work today, we will call you with the results Return in 2 months to see the doctor  Please call the clinic if you have any questions or concerns  Thank you for choosing [place] at [facility] to provide your oncology and hematology care.  To afford each patient quality time with our provider, please arrive at least 15 minutes before your scheduled appointment time.    You need to re-schedule your appointment should you arrive 10 or more minutes late.  We strive to give you quality time with our providers, and arriving late affects you and other patients whose appointments are after yours.  Also, if you no show three or more times for appointments you may be dismissed from the clinic at the providers discretion.     Again, thank you for choosing [facility].  Our hope is that these requests will decrease the amount of time that you wait before being seen by our physicians.       _____________________________________________________________  Should you have questions after your visit to [facility], please contact our office at [phone] between the hours of 8:30 a.m. and 4:30 p.m.  Voicemails left after 4:30 p.m. will not be returned until the following business day.  For prescription refill requests, have your pharmacy contact our office.

## 2015-03-13 NOTE — Progress Notes (Signed)
Richard [name] at [place] NOTE  Patient Care Team: Prince Solian, MD as PCP - General (Internal Medicine)  CHIEF COMPLAINTS/PURPOSE OF CONSULTATION:  Anemia Iron deficiency Elevated ESR, RF  HISTORY OF PRESENTING ILLNESS:  Richard [name] 81 y.o. male is here because of Anemia.  He complains of feeling weak and fatigued and can hardly get up at times, has been feeling like this over 1 year. He sits on his porch everyday and rides his tractor every week, sometimes twice a week an average of 3 acres. He dresses himself everyday.  His wife notes he has been on iron "forever" with no improvement in his anemia. He has had a blood transfusion. He would like more blood transfusions, he says that they make him feel better.   His last C-scope was almost 15 years ago at [place]. He denies black or tarry stool. He denies BRBPR. He notes that iron makes his stool dark. Has never had pill cam  The patient is here today for additional evaluation. He saw a rheumatology in [place].  He had Xrays of his hands,  and his blood was taken. They states Methotrexate was discussed but not prescribed. He has a follow-up in about 2 weeks.  He received IV iron replacement and states that his legs feel stronger. However today in the clinic he is noticed to have multiple scrapes on his face, a bandage above his eye, and a large bruise on his right arm. He tripped on the edge of the sidewalk and fell forwards. He does have a cane but does not use it. He is not willing to use a walker. The patient did not go to the emergency room after his fall.   MEDICAL HISTORY:  Past Medical History  Diagnosis Date  . Hypertension   . Anxiety   . Microcytic anemia   . Hiatal hernia   . Skin cancer of lip   . Sinus drainage   . OSA on CPAP   . Type II diabetes mellitus   . GERD (gastroesophageal reflux disease)   . Arthritis   . Nephrolithiasis   . On home oxygen therapy     a. 2L  w/CPAP at night  . [place] spotted fever ~ 1945  . LBBB (left bundle branch block)   . Hyperlipidemia   . Atrial fibrillation     a. Dx 03/2013, notes report atrial fibrillation/atrial flutter, placed on amiodarone. NSR in subsequent OV's.  . Chest pain     a. H/o CTA negative for PE 2012, normal cath 2005, normal nucs previously including 05/2012.  . Orthostatic hypotension   . B12 deficiency anemia   . Complete heart block     a. s/p Medtronic Adapta L model ADDRL 1 (serial number [id number]) pacemaker.    SURGICAL HISTORY: Past Surgical History  Procedure Laterality Date  . Vasectomy      Hx of   . Lumbar disc surgery  ~ 1993  . Inguinal hernia repair Right   . Cardiac catheterization      by Dr. Acie Fredrickson, January 24, 2004, that shows minimal coronary artery irregularities and normal left ventricular function  . Cataract extraction w/ intraocular lens  implant, bilateral Bilateral   . Skin cancer excision      "lower lip" (04/06/2013)  . Temporary pacemaker insertion N/A 05/31/2014    Procedure: TEMPORARY WIRE;  Surgeon: Sinclair Grooms, MD;  Location: [facility] CATH LAB;  Service: Cardiovascular;  Laterality:  N/A;  . Permanent pacemaker insertion N/A 06/03/2014    MDT Adapta L implanted by Dr Rayann Heman for syncope and transient AV block    SOCIAL HISTORY: History   Social History  . Marital Status: Married    Spouse Name: N/A  . Number of Children: N/A  . Years of Education: N/A   Occupational History  . Not on file.   Social History Main Topics  . Smoking status: Former Smoker -- 0.00 packs/day for 20 years    Types: Cigarettes  . Smokeless tobacco: Never Used     Comment: 04/06/2013 "quit smoking years ago; didn't smoke that much; probably 20 years; 1ppd"  . Alcohol Use: No  . Drug Use: No  . Sexual Activity: No   Other Topics Concern  . Not on file   Social History Narrative  2 children. Married for 58 years. 6 grandchildren 3 great grandchildren Ex-smoker,  quit over 59 yrs ago  ETOH, none Used to play golf, used to work at [employer]: Family History  Problem Relation Age of Onset  . Other      Parents both died of old age; other conditions not known   indicated that his mother is deceased. He indicated that his father is deceased.    Mother passed at 22 of maybe pnenomia, she was in a nursing home Father passed when he was 89 Has 65 brothers and sister, 3 passed Sister has breast cancer   ALLERGIES:  is allergic to codeine and citalopram.  MEDICATIONS:  Current Outpatient Prescriptions  Medication Sig Dispense Refill  . acetaminophen (TYLENOL) 500 MG tablet Take 500 mg by mouth every 6 (six) hours as needed for mild pain.    Marland Kitchen amiodarone (PACERONE) 200 MG tablet Take 1 tablet (200 mg) on Monday, Wednesday, Friday only (Patient taking differently: Take 1 tablet (200 mg) by mouth on Monday, Wednesday, Friday only) 90 tablet 4  . amLODipine (NORVASC) 5 MG tablet TAKE 1 TABLET (5 MG TOTAL) BY MOUTH DAILY. 90 tablet 2  . benazepril (LOTENSIN) 40 MG tablet Take 40 mg by mouth daily.      . Cyanocobalamin (VITAMIN B-12 IJ) Inject as directed every 30 (thirty) days.     . diazepam (VALIUM) 5 MG tablet Take 5 mg by mouth at bedtime.      . fluticasone (FLONASE) 50 MCG/ACT nasal spray Place 1 spray into the nose daily.      . metFORMIN (GLUCOPHAGE) 1000 MG tablet Take 1,000 mg by mouth 2 (two) times daily with a meal.    . metoprolol (LOPRESSOR) 50 MG tablet TAKE 1 TABLET (50 MG TOTAL) BY MOUTH 2 (TWO) TIMES DAILY. 60 tablet 0  . NON FORMULARY CPAP @@ night with Oxygen    . omeprazole (PRILOSEC) 20 MG capsule Take 20 mg by mouth daily.     . pioglitazone (ACTOS) 30 MG tablet Take 30 mg by mouth daily.      . rivaroxaban (XARELTO) 20 MG TABS tablet Take 1 tablet (20 mg total) by mouth daily with supper. 30 tablet 11  . sertraline (ZOLOFT) 100 MG tablet Take 1 tablet by mouth daily.    Marland Kitchen zinc gluconate 50 MG tablet Take 50 mg  by mouth daily.     No current facility-administered medications for this visit.    Review of Systems  Constitutional: Positive for weight loss and malaise/fatigue.       Patient can hardly get up at times Has lost 32 lbs  over the past 2 years and 3-4 lbs in the last week  HENT: Negative.        Dry mouth Edentulous  Eyes:       Dry eyes, takes medication  Respiratory: Negative.   Cardiovascular: Negative.   Gastrointestinal: Negative.   Genitourinary:       Nocturia  Musculoskeletal: Positive for myalgias and joint pain.  Skin: Negative.   Neurological: Positive for dizziness and weakness.       Improved since pacemaker placed  Endo/Heme/Allergies: Negative.   Psychiatric/Behavioral: Negative.   All other systems reviewed and are negative.  14 point ROS was done and is otherwise as detailed above or in HPI   PHYSICAL EXAMINATION: ECOG PERFORMANCE STATUS: 1 - Symptomatic but completely ambulatory  Filed Vitals:   03/13/15 1200  BP: 127/58  Pulse: 60  Temp: 97.8 F (36.6 C)  Resp: 18   Filed Weights   03/13/15 1200  Weight: 258 lb (117.028 kg)     Physical Exam  Constitutional: He is oriented to person, place, and time and well-developed, well-nourished, and in no distress.  HENT: R forearm is bruised, scrapes on both arms and nose, R eye bandaged Head: Normocephalic and atraumatic.  Right Ear: External ear normal.  Left Ear: External ear normal.  Nose: Nose normal.  Mouth/Throat: Oropharynx is clear and moist. No oropharyngeal exudate.  Dry mouth  Eyes: Conjunctivae and EOM are normal. Pupils are equal, round, and reactive to light. Right eye exhibits no discharge. Left eye exhibits no discharge. No scleral icterus.  Dry eyes  Neck: Normal range of motion. Neck supple. No tracheal deviation present. No thyromegaly present.  Cardiovascular: Normal rate, regular rhythm, normal heart sounds and intact distal pulses.  Exam reveals no gallop and no friction rub.    No murmur heard. Pacemaker  Pulmonary/Chest: Effort normal and breath sounds normal. He has no wheezes. He has no rales.  Abdominal: Soft. Bowel sounds are normal. He exhibits no distension and no mass. There is no tenderness. There is no rebound and no guarding.  Musculoskeletal: Normal range of motion. He exhibits no edema.  Lymphadenopathy:    He has no cervical adenopathy.  Neurological: He is alert and oriented to person, place, and time. He has normal reflexes. No cranial nerve deficit. Gait normal. Coordination normal.  Skin: Skin is warm and dry. No rash noted.  Psychiatric: Mood, memory, affect and judgment normal.  Nursing note and vitals reviewed.   LABORATORY DATA:  I have reviewed the data as listed   Results for KENAZ, OLAFSON (MRN [id number]) as of 03/14/2015 16:30  Ref. Range 03/13/2015 12:45  Ferritin Latest Ref Range: 24-336 ng/mL 155  WBC Latest Ref Range: 4.0-10.5 K/uL 4.2  RBC Latest Ref Range: 4.22-5.81 MIL/uL 3.19 (L)  Hemoglobin Latest Ref Range: 13.0-17.0 g/dL 9.5 (L)  HCT Latest Ref Range: 39.0-52.0 % 30.3 (L)  MCV Latest Ref Range: 78.0-100.0 fL 95.0  MCH Latest Ref Range: 26.0-34.0 pg 29.8  MCHC Latest Ref Range: 30.0-36.0 g/dL 31.4  RDW Latest Ref Range: 11.5-15.5 % 21.1 (H)  Platelets Latest Ref Range: 150-400 K/uL 165  Neutrophils Latest Ref Range: 43-77 % 61  Lymphocytes Latest Ref Range: 12-46 % 21  Monocytes Relative Latest Ref Range: 3-12 % 13 (H)  Eosinophil Latest Ref Range: 0-5 % 4  Basophil Latest Ref Range: 0-1 % 1  NEUT# Latest Ref Range: 1.7-7.7 K/uL 2.5  Lymphocyte # Latest Ref Range: 0.7-4.0 K/uL 0.9  Monocyte # Latest Ref  Range: 0.1-1.0 K/uL 0.6  Eosinophils Absolute Latest Ref Range: 0.0-0.7 K/uL 0.2  Basophils Absolute Latest Ref Range: 0.0-0.1 K/uL 0.0  RBC Morphology Unknown POLYCHROMASIA PRE...  RBC. Latest Ref Range: 4.22-5.81 MIL/uL 3.19 (L)  Retic Ct Pct Latest Ref Range: 0.4-3.1 % 2.6  Retic Count, Manual Latest Ref  Range: 19.0-186.0 K/uL 82.9        ASSESSMENT & PLAN:  Anemia CKD, Stage III Fatigue Markedly elevated ESR, rheumatoid factor Severe iron deficiency with serum ferritin of 16 NG/ML Last GI evaluation approximately 10-15 years ago Polyclonal gammopathy, abnormal kappa lambda light chain ratio [place]  Although his iron levels are improved his anemia is only mildly better. I do feel that a referral back to gastroenterology is appropriate. He has upcoming follow-up with rheumatology as well. His anemia is multifactorial, including iron deficiency and anemia of chronic inflammation/disease. He has a markedly elevated rheumatoid factor, ESR, and polyclonal gammopathy.  I will see him back in 2 months with repeat laboratory studies including iron studies. I will make referral back to gastroenterology.  Given the patient's ongoing gait difficulties and recent fall I have suggested physical therapy but he currently declines. At least advised the patient to use his cane.  Orders Placed This Encounter  Procedures  . CBC with Differential    Standing Status: Future     Number of Occurrences:      Standing Expiration Date: 03/12/2016  . Ferritin    Standing Status: Future     Number of Occurrences:      Standing Expiration Date: 03/12/2016  . Comprehensive metabolic panel    Standing Status: Future     Number of Occurrences:      Standing Expiration Date: 03/12/2016   All questions were answered. The patient knows to call the clinic with any problems, questions or concerns.   This note was electronically signed.    This document serves as a record of services personally performed by Ancil Linsey, MD. It was created on her behalf by Janace Hoard, a trained medical scribe. The creation of this record is based on the scribe's personal observations and the provider's statements to them. This document has been checked and approved by the attending provider.  I have reviewed the above  documentation for accuracy and completeness, and I agree with the above.  Kelby Fam. Whitney Muse, MD

## 2015-03-14 NOTE — Progress Notes (Signed)
Labs drawn

## 2015-03-15 ENCOUNTER — Telehealth (HOSPITAL_COMMUNITY): Payer: Self-pay

## 2015-03-15 NOTE — Telephone Encounter (Signed)
Left message to call back to discuss test results.

## 2015-03-20 DIAGNOSIS — R700 Elevated erythrocyte sedimentation rate: Secondary | ICD-10-CM | POA: Diagnosis not present

## 2015-03-20 DIAGNOSIS — M0579 Rheumatoid arthritis with rheumatoid factor of multiple sites without organ or systems involvement: Secondary | ICD-10-CM | POA: Diagnosis not present

## 2015-03-20 DIAGNOSIS — M2550 Pain in unspecified joint: Secondary | ICD-10-CM | POA: Diagnosis not present

## 2015-03-21 DIAGNOSIS — E538 Deficiency of other specified B group vitamins: Secondary | ICD-10-CM | POA: Diagnosis not present

## 2015-03-26 ENCOUNTER — Ambulatory Visit (INDEPENDENT_AMBULATORY_CARE_PROVIDER_SITE_OTHER): Payer: Medicare Other | Admitting: *Deleted

## 2015-03-26 DIAGNOSIS — I442 Atrioventricular block, complete: Secondary | ICD-10-CM | POA: Diagnosis not present

## 2015-03-28 ENCOUNTER — Encounter (INDEPENDENT_AMBULATORY_CARE_PROVIDER_SITE_OTHER): Payer: Self-pay | Admitting: *Deleted

## 2015-03-28 NOTE — Progress Notes (Signed)
Remote pacemaker transmission.   

## 2015-04-03 LAB — CUP PACEART REMOTE DEVICE CHECK
Battery Remaining Longevity: 161 mo
Battery Voltage: 2.79 V
Brady Statistic AP VP Percent: 0 %
Brady Statistic AS VP Percent: 7 %
Lead Channel Impedance Value: 839 Ohm
Lead Channel Pacing Threshold Amplitude: 0.625 V
Lead Channel Pacing Threshold Amplitude: 0.75 V
Lead Channel Pacing Threshold Pulse Width: 0.4 ms
Lead Channel Sensing Intrinsic Amplitude: 0.7 mV
Lead Channel Sensing Intrinsic Amplitude: 16 mV
Lead Channel Setting Pacing Amplitude: 2 V
Lead Channel Setting Pacing Amplitude: 2.5 V
Lead Channel Setting Sensing Sensitivity: 5.6 mV
MDC IDC MSMT BATTERY IMPEDANCE: 100 Ohm
MDC IDC MSMT LEADCHNL RA IMPEDANCE VALUE: 490 Ohm
MDC IDC MSMT LEADCHNL RV PACING THRESHOLD PULSEWIDTH: 0.4 ms
MDC IDC SESS DTM: 20160801134808
MDC IDC SET LEADCHNL RV PACING PULSEWIDTH: 0.4 ms
MDC IDC STAT BRADY AP VS PERCENT: 10 %
MDC IDC STAT BRADY AS VS PERCENT: 83 %

## 2015-04-04 ENCOUNTER — Telehealth (HOSPITAL_COMMUNITY): Payer: Self-pay | Admitting: Emergency Medicine

## 2015-04-04 ENCOUNTER — Other Ambulatory Visit: Payer: Self-pay | Admitting: Cardiovascular Disease

## 2015-04-04 ENCOUNTER — Encounter: Payer: Self-pay | Admitting: Internal Medicine

## 2015-04-04 NOTE — Telephone Encounter (Signed)
Wife called about referrals

## 2015-04-05 ENCOUNTER — Institutional Professional Consult (permissible substitution) (INDEPENDENT_AMBULATORY_CARE_PROVIDER_SITE_OTHER): Payer: Medicare Other | Admitting: Internal Medicine

## 2015-04-09 ENCOUNTER — Encounter: Payer: Self-pay | Admitting: Cardiology

## 2015-04-16 ENCOUNTER — Encounter: Payer: Self-pay | Admitting: Internal Medicine

## 2015-04-25 DIAGNOSIS — E538 Deficiency of other specified B group vitamins: Secondary | ICD-10-CM | POA: Diagnosis not present

## 2015-04-25 NOTE — Telephone Encounter (Signed)
Telephone call  

## 2015-05-08 DIAGNOSIS — Z6832 Body mass index (BMI) 32.0-32.9, adult: Secondary | ICD-10-CM | POA: Diagnosis not present

## 2015-05-08 DIAGNOSIS — M5380 Other specified dorsopathies, site unspecified: Secondary | ICD-10-CM | POA: Diagnosis not present

## 2015-05-08 DIAGNOSIS — I480 Paroxysmal atrial fibrillation: Secondary | ICD-10-CM | POA: Diagnosis not present

## 2015-05-08 DIAGNOSIS — Z9181 History of falling: Secondary | ICD-10-CM | POA: Diagnosis not present

## 2015-05-08 DIAGNOSIS — N183 Chronic kidney disease, stage 3 (moderate): Secondary | ICD-10-CM | POA: Diagnosis not present

## 2015-05-08 DIAGNOSIS — Z23 Encounter for immunization: Secondary | ICD-10-CM | POA: Diagnosis not present

## 2015-05-08 DIAGNOSIS — I10 Essential (primary) hypertension: Secondary | ICD-10-CM | POA: Diagnosis not present

## 2015-05-08 DIAGNOSIS — Z1389 Encounter for screening for other disorder: Secondary | ICD-10-CM | POA: Diagnosis not present

## 2015-05-08 DIAGNOSIS — D649 Anemia, unspecified: Secondary | ICD-10-CM | POA: Diagnosis not present

## 2015-05-08 DIAGNOSIS — E1129 Type 2 diabetes mellitus with other diabetic kidney complication: Secondary | ICD-10-CM | POA: Diagnosis not present

## 2015-05-14 ENCOUNTER — Telehealth: Payer: Self-pay | Admitting: Cardiovascular Disease

## 2015-05-14 NOTE — Telephone Encounter (Signed)
Mr. Richard [name] may hold his Xarelto for 3 days prior to back injection  He is at low risk for his injection .

## 2015-05-14 NOTE — Telephone Encounter (Signed)
New message     Request for surgical clearance:  1. What type of surgery is being performed? Back injection  2. When is this surgery scheduled? [id number]  3. Are there any medications that need to be held prior to surgery and how long? Hold xarelto 3 days prior to procedure  4. Name of physician performing surgery? Dr Nelva Bush  5. What is your office phone and fax number? [phone]

## 2015-05-14 NOTE — Telephone Encounter (Signed)
Clearance note placed in outgoing fax in HIM

## 2015-05-15 ENCOUNTER — Encounter (HOSPITAL_COMMUNITY): Payer: Self-pay | Admitting: Hematology & Oncology

## 2015-05-15 ENCOUNTER — Encounter (HOSPITAL_COMMUNITY): Payer: Self-pay | Admitting: Lab

## 2015-05-15 ENCOUNTER — Encounter (HOSPITAL_BASED_OUTPATIENT_CLINIC_OR_DEPARTMENT_OTHER): Payer: Medicare Other

## 2015-05-15 ENCOUNTER — Encounter (HOSPITAL_COMMUNITY): Payer: Medicare Other | Attending: Hematology & Oncology | Admitting: Hematology & Oncology

## 2015-05-15 VITALS — BP 138/60 | HR 60 | Temp 98.0°F | Resp 20 | Wt 260.3 lb

## 2015-05-15 DIAGNOSIS — D500 Iron deficiency anemia secondary to blood loss (chronic): Secondary | ICD-10-CM | POA: Diagnosis present

## 2015-05-15 DIAGNOSIS — D509 Iron deficiency anemia, unspecified: Secondary | ICD-10-CM

## 2015-05-15 DIAGNOSIS — D508 Other iron deficiency anemias: Secondary | ICD-10-CM | POA: Diagnosis not present

## 2015-05-15 DIAGNOSIS — D649 Anemia, unspecified: Secondary | ICD-10-CM

## 2015-05-15 DIAGNOSIS — R682 Dry mouth, unspecified: Secondary | ICD-10-CM | POA: Diagnosis not present

## 2015-05-15 DIAGNOSIS — R5383 Other fatigue: Secondary | ICD-10-CM | POA: Diagnosis not present

## 2015-05-15 DIAGNOSIS — H04123 Dry eye syndrome of bilateral lacrimal glands: Secondary | ICD-10-CM | POA: Diagnosis not present

## 2015-05-15 LAB — CBC WITH DIFFERENTIAL/PLATELET
BASOS ABS: 0.1 10*3/uL (ref 0.0–0.1)
Basophils Relative: 2 %
EOS ABS: 0.2 10*3/uL (ref 0.0–0.7)
EOS PCT: 5 %
HCT: 33.2 % — ABNORMAL LOW (ref 39.0–52.0)
Hemoglobin: 10.4 g/dL — ABNORMAL LOW (ref 13.0–17.0)
LYMPHS PCT: 23 %
Lymphs Abs: 1.1 10*3/uL (ref 0.7–4.0)
MCH: 29.3 pg (ref 26.0–34.0)
MCHC: 31.3 g/dL (ref 30.0–36.0)
MCV: 93.5 fL (ref 78.0–100.0)
Monocytes Absolute: 0.4 10*3/uL (ref 0.1–1.0)
Monocytes Relative: 10 %
Neutro Abs: 2.9 10*3/uL (ref 1.7–7.7)
Neutrophils Relative %: 60 %
PLATELETS: 163 10*3/uL (ref 150–400)
RBC: 3.55 MIL/uL — AB (ref 4.22–5.81)
RDW: 17 % — ABNORMAL HIGH (ref 11.5–15.5)
WBC: 4.6 10*3/uL (ref 4.0–10.5)

## 2015-05-15 LAB — COMPREHENSIVE METABOLIC PANEL
ALBUMIN: 3.5 g/dL (ref 3.5–5.0)
ALT: 21 U/L (ref 17–63)
AST: 33 U/L (ref 15–41)
Alkaline Phosphatase: 64 U/L (ref 38–126)
Anion gap: 7 (ref 5–15)
BUN: 21 mg/dL — AB (ref 6–20)
CHLORIDE: 105 mmol/L (ref 101–111)
CO2: 30 mmol/L (ref 22–32)
Calcium: 8.6 mg/dL — ABNORMAL LOW (ref 8.9–10.3)
Creatinine, Ser: 1.19 mg/dL (ref 0.61–1.24)
GFR calc Af Amer: >60 mL/min (ref >60)
GFR calc non Af Amer: 55 mL/min — ABNORMAL LOW (ref >60)
GLUCOSE: 162 mg/dL — AB (ref 65–99)
POTASSIUM: 4.5 mmol/L (ref 3.5–5.1)
Sodium: 142 mmol/L (ref 135–145)
Total Bilirubin: 0.5 mg/dL (ref 0.3–1.2)
Total Protein: 7.5 g/dL (ref 6.5–8.1)

## 2015-05-15 LAB — FERRITIN: FERRITIN: 28 ng/mL (ref 24–336)

## 2015-05-15 NOTE — Progress Notes (Signed)
LABS DRAWN

## 2015-05-15 NOTE — Patient Instructions (Signed)
[place] at [facility] Discharge Instructions  RECOMMENDATIONS MADE BY THE CONSULTANT AND ANY TEST RESULTS WILL BE SENT TO YOUR REFERRING PHYSICIAN.  Exam and discussion by Dr. Whitney Muse. Will make a referral to Dr. Gavin Pound for your joint pain and abnormal labs.  You will be contacted with the date and time of your appointment. If your iron studies show that you need additional iron we will contact you.  Labs and office visit in 3 months.  Thank you for choosing [place] at [facility] to provide your oncology and hematology care.  To afford each patient quality time with our provider, please arrive at least 15 minutes before your scheduled appointment time.    You need to re-schedule your appointment should you arrive 10 or more minutes late.  We strive to give you quality time with our providers, and arriving late affects you and other patients whose appointments are after yours.  Also, if you no show three or more times for appointments you may be dismissed from the clinic at the providers discretion.     Again, thank you for choosing [facility].  Our hope is that these requests will decrease the amount of time that you wait before being seen by our physicians.       _____________________________________________________________  Should you have questions after your visit to [facility] please contact our office at [phone] between the hours of 8:30 a.m. and 4:30 p.m.  Voicemails left after 4:30 p.m. will not be returned until the following business day.  For prescription refill requests, have your pharmacy contact our office.

## 2015-05-15 NOTE — Progress Notes (Signed)
[facility] [place] NOTE  Patient Care Team: Prince Solian, MD as PCP - General (Internal Medicine)  CHIEF COMPLAINTS/PURPOSE OF CONSULTATION:  Anemia Iron deficiency Elevated ESR, RF  HISTORY OF PRESENTING ILLNESS:  Richard [name] 81 y.o. male is here because of Anemia. Last dose of IV iron was given in July. He received two doses of faraheme.  The patient says that he is feeling better. He has not been back to Rheumatology.  He does have an appointment on 06/04/15.  He has complaints of a headache that he has had for the past 3 weeks. He attributes this to his glasses.  He will have laser surgery on his cataract.  He has recently had injections for back pain. He notes that this has helped significantly.  His wife notes that she does see improvement with him.  He still refuses to use a cane or walker.   His weight has been stable.  MEDICAL HISTORY:  Past Medical History  Diagnosis Date  . Hypertension   . Anxiety   . Microcytic anemia   . Hiatal hernia   . Skin cancer of lip   . Sinus drainage   . OSA on CPAP   . Type II diabetes mellitus   . GERD (gastroesophageal reflux disease)   . Arthritis   . Nephrolithiasis   . On home oxygen therapy     a. 2L w/CPAP at night  . Rocky Mountain spotted fever ~ 1945  . LBBB (left bundle branch block)   . Hyperlipidemia   . Atrial fibrillation     a. Dx 03/2013, notes report atrial fibrillation/atrial flutter, placed on amiodarone. NSR in subsequent OV's.  . Chest pain     a. H/o CTA negative for PE 2012, normal cath 2005, normal nucs previously including 05/2012.  . Orthostatic hypotension   . B12 deficiency anemia   . Complete heart block     a. s/p Medtronic Adapta L model ADDRL 1 (serial number [id number]) pacemaker.  . Iron deficiency anemia     SURGICAL HISTORY: Past Surgical History  Procedure Laterality Date  . Vasectomy      Hx of   . Lumbar disc surgery  ~ 1993  . Inguinal  hernia repair Right   . Cardiac catheterization      by Dr. Acie Fredrickson, January 24, 2004, that shows minimal coronary artery irregularities and normal left ventricular function  . Cataract extraction w/ intraocular lens  implant, bilateral Bilateral   . Skin cancer excision      "lower lip" (04/06/2013)  . Temporary pacemaker insertion N/A 05/31/2014    Procedure: TEMPORARY WIRE;  Surgeon: Sinclair Grooms, MD;  Location: [facility] CATH LAB;  Service: Cardiovascular;  Laterality: N/A;  . Permanent pacemaker insertion N/A 06/03/2014    MDT Adapta L implanted by Dr Rayann Heman for syncope and transient AV block    SOCIAL HISTORY: Social History   Social History  . Marital Status: Married    Spouse Name: N/A  . Number of Children: N/A  . Years of Education: N/A   Occupational History  . Not on file.   Social History Main Topics  . Smoking status: Former Smoker -- 0.00 packs/day for 20 years    Types: Cigarettes  . Smokeless tobacco: Never Used     Comment: 04/06/2013 "quit smoking years ago; didn't smoke that much; probably 20 years; 1ppd"  . Alcohol Use: No  . Drug Use: No  .  Sexual Activity: No   Other Topics Concern  . Not on file   Social History Narrative  2 children. Married for 58 years. 6 grandchildren 3 great grandchildren Ex-smoker, quit over 73 yrs ago  ETOH, none Used to play golf, used to work at [employer]: Family History  Problem Relation Age of Onset  . Other      Parents both died of old age; other conditions not known   indicated that his mother is deceased. He indicated that his father is deceased.    Mother passed at 45 of maybe pnenomia, she was in a nursing home Father passed when he was 74 Has 40 brothers and sister, 3 passed Sister has breast cancer   ALLERGIES:  is allergic to codeine and citalopram.  MEDICATIONS:  Current Outpatient Prescriptions  Medication Sig Dispense Refill  . acetaminophen (TYLENOL) 500 MG tablet Take 500 mg by mouth  every 6 (six) hours as needed for mild pain.    Marland Kitchen amiodarone (PACERONE) 200 MG tablet Take 1 tablet (200 mg) on Monday, Wednesday, Friday only (Patient taking differently: Take 1 tablet (200 mg) by mouth on Monday, Wednesday, Friday only) 90 tablet 4  . amLODipine (NORVASC) 5 MG tablet TAKE 1 TABLET (5 MG TOTAL) BY MOUTH DAILY. 90 tablet 2  . benazepril (LOTENSIN) 40 MG tablet Take 40 mg by mouth daily.      . Cyanocobalamin (VITAMIN B-12 IJ) Inject as directed every 30 (thirty) days.     . diazepam (VALIUM) 5 MG tablet Take 5 mg by mouth at bedtime.      . fluticasone (FLONASE) 50 MCG/ACT nasal spray Place 1 spray into the nose daily.      . metFORMIN (GLUCOPHAGE) 1000 MG tablet Take 1,000 mg by mouth 2 (two) times daily with a meal.    . metoprolol (LOPRESSOR) 50 MG tablet TAKE 1 TABLET (50 MG TOTAL) BY MOUTH 2 (TWO) TIMES DAILY. 60 tablet 2  . NON FORMULARY CPAP @@ night with Oxygen    . omeprazole (PRILOSEC) 20 MG capsule Take 20 mg by mouth daily.     . pioglitazone (ACTOS) 30 MG tablet Take 30 mg by mouth daily.      . rivaroxaban (XARELTO) 20 MG TABS tablet Take 1 tablet (20 mg total) by mouth daily with supper. 30 tablet 11  . sertraline (ZOLOFT) 100 MG tablet Take 1 tablet by mouth daily.    Marland Kitchen zinc gluconate 50 MG tablet Take 50 mg by mouth daily.     No current facility-administered medications for this visit.    Review of Systems  Constitutional: Positive for weight loss and malaise/fatigue.       Patient can hardly get up at times Has lost 32 lbs over the past 2 years HENT: Negative.        Dry mouth Edentulous  Eyes:       Dry eyes, takes medication  Respiratory: Negative.   Cardiovascular: Negative.   Gastrointestinal: Negative.   Genitourinary:       Nocturia  Musculoskeletal: Positive for myalgias and joint pain.  Skin: Negative.   Neurological: Positive for dizziness and weakness.       Improved since pacemaker placed  Endo/Heme/Allergies: Negative.     Psychiatric/Behavioral: Negative.   All other systems reviewed and are negative.  14 point ROS was done and is otherwise as detailed above or in HPI    PHYSICAL EXAMINATION: ECOG PERFORMANCE STATUS: 1 - Symptomatic but completely ambulatory  Filed Vitals:   05/15/15 1339  BP: 138/60  Pulse: 60  Temp: 98 F (36.7 C)  Resp: 20   Filed Weights   05/15/15 1339  Weight: 260 lb 4.8 oz (118.071 kg)    Physical Exam  Constitutional: He is oriented to person, place, and time and well-developed, well-nourished, and in no distress.  HENT: R forearm is bruised, scrapes on both arms and nose, R eye bandaged Head: Normocephalic and atraumatic.  Right Ear: External ear normal.  Left Ear: External ear normal.  Nose: Nose normal.  Mouth/Throat: Oropharynx is clear and moist. No oropharyngeal exudate.  Dry mouth  Eyes: Conjunctivae and EOM are normal. Pupils are equal, round, and reactive to light. Right eye exhibits no discharge. Left eye exhibits no discharge. No scleral icterus.  Dry eyes  Neck: Normal range of motion. Neck supple. No tracheal deviation present. No thyromegaly present.  Cardiovascular: Normal rate, regular rhythm, normal heart sounds and intact distal pulses.  Exam reveals no gallop and no friction rub.   No murmur heard. Pacemaker  Pulmonary/Chest: Effort normal and breath sounds normal. He has no wheezes. He has no rales.  Abdominal: Soft. Bowel sounds are normal. He exhibits no distension and no mass. There is no tenderness. There is no rebound and no guarding.  Musculoskeletal: Normal range of motion. He exhibits no edema.  Lymphadenopathy:    He has no cervical adenopathy.  Neurological: He is alert and oriented to person, place, and time. He has normal reflexes. No cranial nerve deficit. Gait normal. Coordination normal.  Skin: Skin is warm and dry. No rash noted.  Psychiatric: Mood, memory, affect and judgment normal.  Nursing note and vitals  reviewed.   LABORATORY DATA:  I have reviewed the data as listed.  Results for MADHAV, MOHON (MRN [id number]) as of 05/24/2015 15:38  Ref. Range 05/15/2015 12:33  Sodium Latest Ref Range: 135-145 mmol/L 142  Potassium Latest Ref Range: 3.5-5.1 mmol/L 4.5  Chloride Latest Ref Range: 101-111 mmol/L 105  CO2 Latest Ref Range: 22-32 mmol/L 30  BUN Latest Ref Range: 6-20 mg/dL 21 (H)  Creatinine Latest Ref Range: 0.61-1.24 mg/dL 1.19  Calcium Latest Ref Range: 8.9-10.3 mg/dL 8.6 (L)  EGFR (Non-African Amer.) Latest Ref Range: >60 mL/min 55 (L)  EGFR (African American) Latest Ref Range: >60 mL/min >60  Glucose Latest Ref Range: 65-99 mg/dL 162 (H)  Anion gap Latest Ref Range: 5-15  7  Alkaline Phosphatase Latest Ref Range: 38-126 U/L 64  Albumin Latest Ref Range: 3.5-5.0 g/dL 3.5  AST Latest Ref Range: 15-41 U/L 33  ALT Latest Ref Range: 17-63 U/L 21  Total Protein Latest Ref Range: 6.5-8.1 g/dL 7.5  Total Bilirubin Latest Ref Range: 0.3-1.2 mg/dL 0.5  Ferritin Latest Ref Range: 24-336 ng/mL 28  WBC Latest Ref Range: 4.0-10.5 K/uL 4.6  RBC Latest Ref Range: 4.22-5.81 MIL/uL 3.55 (L)  Hemoglobin Latest Ref Range: 13.0-17.0 g/dL 10.4 (L)  HCT Latest Ref Range: 39.0-52.0 % 33.2 (L)  MCV Latest Ref Range: 78.0-100.0 fL 93.5  MCH Latest Ref Range: 26.0-34.0 pg 29.3  MCHC Latest Ref Range: 30.0-36.0 g/dL 31.3  RDW Latest Ref Range: 11.5-15.5 % 17.0 (H)  Platelets Latest Ref Range: 150-400 K/uL 163  Neutrophils Latest Units: % 60  Lymphocytes Latest Units: % 23  Monocytes Relative Latest Units: % 10  Eosinophil Latest Units: % 5  Basophil Latest Units: % 2  NEUT# Latest Ref Range: 1.7-7.7 K/uL 2.9  Lymphocyte # Latest Ref Range: 0.7-4.0 K/uL 1.1  Monocyte # Latest Ref Range: 0.1-1.0 K/uL 0.4  Eosinophils Absolute Latest Ref Range: 0.0-0.7 K/uL 0.2  Basophils Absolute Latest Ref Range: 0.0-0.1 K/uL 0.1    ASSESSMENT & PLAN:  Anemia CKD, Stage III Fatigue Markedly elevated ESR,  rheumatoid factor Severe iron deficiency with serum ferritin of 16 NG/ML Last GI evaluation approximately 10-15 years ago Polyclonal gammopathy, abnormal kappa lambda light chain ratio [place]  Although his iron levels are improved his anemia is only mildly better. I do feel that a referral back to gastroenterology is appropriate. He wants to make this appointment but has not yet and I encouraged following up with his GI physician in [place].  They have asked for a referral to Dr. Gavin Pound. We will make this for him today.   His anemia is multifactorial, including iron deficiency and anemia of chronic inflammation/disease. He has a markedly elevated rheumatoid factor, ESR, and polyclonal gammopathy.  I will see him back in 3 months with repeat laboratory studies including iron studies. I will make referral back to gastroenterology at his next visit if he has not followed up wth them.  Given the patient's ongoing gait difficulties and recent fall I have suggested physical therapy but he currently declines. At least advised the patient to use his cane.  We will notify the patient of his iron levels and arrange for IV iron if needed. Orders Placed This Encounter  Procedures  . CBC with Differential    Standing Status: Future     Number of Occurrences:      Standing Expiration Date: 05/14/2016  . Ferritin    Standing Status: Future     Number of Occurrences:      Standing Expiration Date: 05/14/2016  . Iron and TIBC    Standing Status: Future     Number of Occurrences:      Standing Expiration Date: 05/14/2016  . Sedimentation rate    Standing Status: Future     Number of Occurrences:      Standing Expiration Date: 05/14/2016    All questions were answered. The patient knows to call the clinic with any problems, questions or concerns. //  This note was electronically signed.    This document serves as a record of services personally performed by Ancil Linsey, MD. It was created on  her behalf by Janace Hoard, a trained medical scribe. The creation of this record is based on the scribe's personal observations and the provider's statements to them. This document has been checked and approved by the attending provider.  I have reviewed the above documentation for accuracy and completeness, and I agree with the above.  Kelby Fam. Whitney Muse, MD

## 2015-05-15 NOTE — Progress Notes (Signed)
Referral sent to Dr Berna Bue.  Records faxed on 9/20.  They will review and call the patient with an appt.

## 2015-05-16 ENCOUNTER — Other Ambulatory Visit (HOSPITAL_COMMUNITY): Payer: Self-pay | Admitting: Hematology & Oncology

## 2015-05-16 DIAGNOSIS — D509 Iron deficiency anemia, unspecified: Secondary | ICD-10-CM

## 2015-05-21 ENCOUNTER — Encounter (HOSPITAL_BASED_OUTPATIENT_CLINIC_OR_DEPARTMENT_OTHER): Payer: Medicare Other

## 2015-05-21 VITALS — BP 138/59 | HR 60 | Temp 97.8°F | Resp 18

## 2015-05-21 DIAGNOSIS — D509 Iron deficiency anemia, unspecified: Secondary | ICD-10-CM

## 2015-05-21 MED ORDER — SODIUM CHLORIDE 0.9 % IV SOLN
510.0000 mg | Freq: Once | INTRAVENOUS | Status: AC
  Administered 2015-05-21: 510 mg via INTRAVENOUS
  Filled 2015-05-21: qty 17

## 2015-05-21 MED ORDER — SODIUM CHLORIDE 0.9 % IV SOLN
INTRAVENOUS | Status: DC
  Administered 2015-05-21: 14:00:00 via INTRAVENOUS

## 2015-05-21 NOTE — Progress Notes (Signed)
Tolerated iron infusion well. 

## 2015-05-21 NOTE — Patient Instructions (Signed)
[place] at [facility] Discharge Instructions  RECOMMENDATIONS MADE BY THE CONSULTANT AND ANY TEST RESULTS WILL BE SENT TO YOUR REFERRING PHYSICIAN.  Today you received feraheme 510 mg iron infusion as ordered. Return as scheduled.  Thank you for choosing [place] at [facility] to provide your oncology and hematology care.  To afford each patient quality time with our Nihar Klus, please arrive at least 15 minutes before your scheduled appointment time.    You need to re-schedule your appointment should you arrive 10 or more minutes late.  We strive to give you quality time with our providers, and arriving late affects you and other patients whose appointments are after yours.  Also, if you no show three or more times for appointments you may be dismissed from the clinic at the providers discretion.     Again, thank you for choosing [facility].  Our hope is that these requests will decrease the amount of time that you wait before being seen by our physicians.       _____________________________________________________________  Should you have questions after your visit to [facility], please contact our office at [phone] between the hours of 8:30 a.m. and 4:30 p.m.  Voicemails left after 4:30 p.m. will not be returned until the following business day.  For prescription refill requests, have your pharmacy contact our office.

## 2015-05-22 ENCOUNTER — Ambulatory Visit (HOSPITAL_COMMUNITY): Payer: Medicare Other

## 2015-05-22 DIAGNOSIS — H2640 Unspecified secondary cataract: Secondary | ICD-10-CM | POA: Diagnosis not present

## 2015-05-22 DIAGNOSIS — H26491 Other secondary cataract, right eye: Secondary | ICD-10-CM | POA: Diagnosis not present

## 2015-05-24 ENCOUNTER — Encounter (HOSPITAL_COMMUNITY): Payer: Self-pay | Admitting: Hematology & Oncology

## 2015-05-24 DIAGNOSIS — R5383 Other fatigue: Secondary | ICD-10-CM | POA: Diagnosis not present

## 2015-05-24 DIAGNOSIS — D508 Other iron deficiency anemias: Secondary | ICD-10-CM | POA: Diagnosis not present

## 2015-05-24 DIAGNOSIS — M25531 Pain in right wrist: Secondary | ICD-10-CM | POA: Diagnosis not present

## 2015-05-24 DIAGNOSIS — R700 Elevated erythrocyte sedimentation rate: Secondary | ICD-10-CM | POA: Diagnosis not present

## 2015-05-24 DIAGNOSIS — R768 Other specified abnormal immunological findings in serum: Secondary | ICD-10-CM | POA: Diagnosis not present

## 2015-05-25 DIAGNOSIS — M4726 Other spondylosis with radiculopathy, lumbar region: Secondary | ICD-10-CM | POA: Diagnosis not present

## 2015-05-25 DIAGNOSIS — M5136 Other intervertebral disc degeneration, lumbar region: Secondary | ICD-10-CM | POA: Diagnosis not present

## 2015-05-25 DIAGNOSIS — M961 Postlaminectomy syndrome, not elsewhere classified: Secondary | ICD-10-CM | POA: Diagnosis not present

## 2015-05-29 DIAGNOSIS — E538 Deficiency of other specified B group vitamins: Secondary | ICD-10-CM | POA: Diagnosis not present

## 2015-06-04 ENCOUNTER — Telehealth: Payer: Self-pay

## 2015-06-04 ENCOUNTER — Ambulatory Visit (INDEPENDENT_AMBULATORY_CARE_PROVIDER_SITE_OTHER): Payer: Medicare Other | Admitting: Internal Medicine

## 2015-06-04 ENCOUNTER — Encounter: Payer: Self-pay | Admitting: Internal Medicine

## 2015-06-04 VITALS — BP 140/70 | HR 68 | Ht 73.75 in | Wt 253.1 lb

## 2015-06-04 DIAGNOSIS — D509 Iron deficiency anemia, unspecified: Secondary | ICD-10-CM | POA: Diagnosis not present

## 2015-06-04 DIAGNOSIS — K449 Diaphragmatic hernia without obstruction or gangrene: Secondary | ICD-10-CM | POA: Diagnosis not present

## 2015-06-04 NOTE — Progress Notes (Signed)
Referred by: Prince Solian, MD and Dr. Whitney Muse  Subjective:    Patient ID: Richard [name], male    DOB: April 20, 1931, 81 y.o.   MRN: [id number] Chief complaint: Iron deficiency anemia HPI The patient is a very nice elderly white man here with his wife, he has been seen for anemia by Dr. Whitney Muse with thoughts of it being due to iron deficiency as his ferritin was low, as well as a polyclonal gammopathy and probable chronic inflammatory chronic disease state. He has been treated with ferriheme and his hemoglobin improved from about 7-10. He is complaining of dyspnea and weakness though it is better he is still frustrated by his reduced exercise tolerance. EGD 2005 large hiatal hernia and 12 mm antral polyp Colonoscopy 2005 hemorrhoids, hyperplastic polyps, diverticulosis. GI ROS otherwise negative Allergies  Allergen Reactions  . Codeine Other (See Comments)    Hallucinations   . Citalopram Swelling and Rash   Outpatient Prescriptions Prior to Visit  Medication Sig Dispense Refill  . acetaminophen (TYLENOL) 500 MG tablet Take 500 mg by mouth every 6 (six) hours as needed for mild pain.    Marland Kitchen amiodarone (PACERONE) 200 MG tablet Take 1 tablet (200 mg) on Monday, Wednesday, Friday only (Patient taking differently: Take 200 mg by mouth every Monday, Wednesday, and Friday. Take 1 tablet (200 mg) by mouth on Monday, Wednesday, Friday onl) 90 tablet 4  . amLODipine (NORVASC) 5 MG tablet TAKE 1 TABLET (5 MG TOTAL) BY MOUTH DAILY. 90 tablet 2  . benazepril (LOTENSIN) 40 MG tablet Take 40 mg by mouth daily.      . Cyanocobalamin (VITAMIN B-12 IJ) Inject as directed every 30 (thirty) days.     . diazepam (VALIUM) 5 MG tablet Take 5 mg by mouth at bedtime.      . fluticasone (FLONASE) 50 MCG/ACT nasal spray Place 1 spray into the nose daily.      . metFORMIN (GLUCOPHAGE) 1000 MG tablet Take 1,000 mg by mouth 2 (two) times daily with a meal.    . metoprolol (LOPRESSOR) 50 MG tablet TAKE 1 TABLET (50 MG  TOTAL) BY MOUTH 2 (TWO) TIMES DAILY. 60 tablet 2  . NON FORMULARY CPAP @@ night with Oxygen    . omeprazole (PRILOSEC) 20 MG capsule Take 20 mg by mouth daily.     . pioglitazone (ACTOS) 30 MG tablet Take 30 mg by mouth daily.      . rivaroxaban (XARELTO) 20 MG TABS tablet Take 1 tablet (20 mg total) by mouth daily with supper. 30 tablet 11  . sertraline (ZOLOFT) 100 MG tablet Take 1 tablet by mouth daily.    Marland Kitchen zinc gluconate 50 MG tablet Take 50 mg by mouth daily.     No facility-administered medications prior to visit.   Past Medical History  Diagnosis Date  . Hypertension   . Anxiety   . Microcytic anemia   . Hiatal hernia   . Skin cancer of lip   . Sinus drainage   . OSA on CPAP   . Type II diabetes mellitus ([place])   . GERD (gastroesophageal reflux disease)   . Arthritis   . Nephrolithiasis   . On home oxygen therapy     a. 2L w/CPAP at night  . Rocky Mountain spotted fever ~ 1945  . LBBB (left bundle branch block)   . Hyperlipidemia   . Atrial fibrillation ([place])     a. Dx 03/2013, notes report atrial fibrillation/atrial flutter, placed on amiodarone. NSR in  subsequent OV's.  . Chest pain     a. H/o CTA negative for PE 2012, normal cath 2005, normal nucs previously including 05/2012.  . Orthostatic hypotension   . B12 deficiency anemia   . Complete heart block ([facility])     a. s/p Medtronic Adapta L model ADDRL 1 (serial number [id number]) pacemaker.  . Iron deficiency anemia    Past Surgical History  Procedure Laterality Date  . Vasectomy      Hx of   . Lumbar disc surgery  ~ 1993  . Inguinal hernia repair Right   . Cardiac catheterization      by Dr. Acie Fredrickson, January 24, 2004, that shows minimal coronary artery irregularities and normal left ventricular function  . Cataract extraction w/ intraocular lens  implant, bilateral Bilateral   . Skin cancer excision      "lower lip" (04/06/2013)  . Temporary pacemaker insertion N/A 05/31/2014    Procedure: TEMPORARY WIRE;   Surgeon: Sinclair Grooms, MD;  Location: [facility] CATH LAB;  Service: Cardiovascular;  Laterality: N/A;  . Permanent pacemaker insertion N/A 06/03/2014    MDT Adapta L implanted by Dr Rayann Heman for syncope and transient AV block   Social History   Social History  . Marital Status: Married    Spouse Name: N/A  . Number of Children: 2  . Years of Education: N/A   Occupational History  . retired    Social History Main Topics  . Smoking status: Former Smoker -- 0.00 packs/day for 20 years    Types: Cigarettes  . Smokeless tobacco: Never Used     Comment: 04/06/2013 "quit smoking years ago; didn't smoke that much; probably 20 years; 1ppd"  . Alcohol Use: No  . Drug Use: No  . Sexual Activity: No   Other Topics Concern  . None   Social History Narrative   Family History  Problem Relation Age of Onset  . Other      Parents both died of old age; other conditions not known  . Stroke Brother   . Breast cancer Sister   . Pneumonia Father   . Thyroid disease Mother     goiter       Review of Systems Fatigue, dyspnea on exertion, back pain, decreased hearing, decreased vision all other ROS negative    Objective:   Physical Exam @BP  140/70 mmHg  Pulse 68  Ht 6' 1.75" (1.873 m)  Wt 253 lb 2 oz (114.817 kg)  BMI 32.73 kg/m2@  General:  Well-developed, well-nourished and in no acute distress - obese Eyes:  anicteric. ENT:   Mouth and posterior pharynx free of lesions. + dentures Neck:   supple w/o thyromegaly or mass.  Lungs: Clear to auscultation bilaterally. Heart:  S1S2, no rubs, murmurs, gallops. Abdomen:  obesesoft, non-tender, no hepatosplenomegaly, hernia, or mass and BS+. Prominent abd wall vv Lymph:  no cervical or supraclavicular adenopathy. Extremities:   1+ pretibial edema, cyanosis or clubbing Skin   brown discoloartion pretiebial skin Neuro:  A&O x 3.  Psych:  appropriate mood and  Affect.   Data Reviewed: As above + hem onc notes 2016, labs 2016  Lab Results   Component Value Date   WBC 4.6 05/15/2015   HGB 10.4* 05/15/2015   HCT 33.2* 05/15/2015   MCV 93.5 05/15/2015   PLT 163 05/15/2015   Lab Results  Component Value Date   FERRITIN 28 05/15/2015      Assessment & Plan:  Anemia, iron deficiency -  Plan: 0.9 %  sodium chloride infusion, Ambulatory referral to Gastroenterology  Hiatal hernia    EGD will be performed to evaluate. The risks and benefits as well as alternatives of endoscopic procedure(s) have been discussed and reviewed. All questions answered. The patient agrees to proceed. Anticipate holding Xarelto 2 d before the EGD. Rare increased risk of stroke off Xarelto explained.  Probably having bloodloss from [facility] erosions or maybe a gastric polyp accentruated by xarelto He is not inclined to pursue a colonoscopy  Anemia iron-def component but also chronic dz, gammopathy.  EO:FHQR,[id number] R, MD Ancil Linsey, MD

## 2015-06-04 NOTE — Telephone Encounter (Signed)
[facility] [address] [place] [address]  06/04/2015   RE: Richard [name] DOB: March 09, 1931 MRN: [id number]   Dear Mertie Moores MD,    We have scheduled the above patient for an endoscopic procedure. Our records show that he is on anticoagulation therapy.   Please advise as to how long the patient may come off his therapy of xarelto prior to the EGD procedure, which is scheduled for 06/21/2015.  Please fax back/ or route the completed form to [name], [place] at [phone].   Sincerely,    Silvano Rusk, MD

## 2015-06-04 NOTE — Patient Instructions (Signed)
  You have been scheduled for an endoscopy. Please follow written instructions given to you at your visit today. If you use inhalers (even only as needed), please bring them with you on the day of your procedure.   I appreciate the opportunity to care for you. Carl Gessner, MD, FACG 

## 2015-06-05 NOTE — Telephone Encounter (Signed)
Patient informed to hold xarelto 2 days prior to procedure.  He verbalized understanding.

## 2015-06-05 NOTE — Telephone Encounter (Signed)
Richard [name] may hold his Xarelto for 2 days prior to endoscopy procedure. Restart as soon as possible after the procedure.

## 2015-06-08 ENCOUNTER — Encounter: Payer: Self-pay | Admitting: Internal Medicine

## 2015-06-08 DIAGNOSIS — R768 Other specified abnormal immunological findings in serum: Secondary | ICD-10-CM | POA: Diagnosis not present

## 2015-06-08 DIAGNOSIS — M25531 Pain in right wrist: Secondary | ICD-10-CM | POA: Diagnosis not present

## 2015-06-08 DIAGNOSIS — R700 Elevated erythrocyte sedimentation rate: Secondary | ICD-10-CM | POA: Diagnosis not present

## 2015-06-08 DIAGNOSIS — R5383 Other fatigue: Secondary | ICD-10-CM | POA: Diagnosis not present

## 2015-06-08 DIAGNOSIS — D508 Other iron deficiency anemias: Secondary | ICD-10-CM | POA: Diagnosis not present

## 2015-06-15 ENCOUNTER — Encounter (HOSPITAL_COMMUNITY): Payer: Self-pay | Admitting: *Deleted

## 2015-06-21 ENCOUNTER — Encounter (HOSPITAL_COMMUNITY): Payer: Self-pay

## 2015-06-21 ENCOUNTER — Ambulatory Visit (HOSPITAL_COMMUNITY): Payer: Medicare Other | Admitting: Anesthesiology

## 2015-06-21 ENCOUNTER — Encounter (HOSPITAL_COMMUNITY)
Admission: RE | Disposition: A | Discharge status: Home / Self Care | Payer: Self-pay | Source: Ambulatory Visit | Attending: Internal Medicine

## 2015-06-21 ENCOUNTER — Ambulatory Visit (HOSPITAL_COMMUNITY)
Admission: RE | Admit: 2015-06-21 | Discharge: 2015-06-21 | Disposition: A | Discharge status: Home / Self Care | Payer: Medicare Other | Source: Ambulatory Visit | Attending: Internal Medicine | Admitting: Internal Medicine

## 2015-06-21 DIAGNOSIS — K317 Polyp of stomach and duodenum: Secondary | ICD-10-CM | POA: Diagnosis not present

## 2015-06-21 DIAGNOSIS — M1990 Unspecified osteoarthritis, unspecified site: Secondary | ICD-10-CM | POA: Insufficient documentation

## 2015-06-21 DIAGNOSIS — D890 Polyclonal hypergammaglobulinemia: Secondary | ICD-10-CM | POA: Diagnosis not present

## 2015-06-21 DIAGNOSIS — Z87891 Personal history of nicotine dependence: Secondary | ICD-10-CM | POA: Diagnosis not present

## 2015-06-21 DIAGNOSIS — D131 Benign neoplasm of stomach: Secondary | ICD-10-CM | POA: Diagnosis not present

## 2015-06-21 DIAGNOSIS — G4733 Obstructive sleep apnea (adult) (pediatric): Secondary | ICD-10-CM | POA: Insufficient documentation

## 2015-06-21 DIAGNOSIS — Z950 Presence of cardiac pacemaker: Secondary | ICD-10-CM | POA: Diagnosis not present

## 2015-06-21 DIAGNOSIS — Z6832 Body mass index (BMI) 32.0-32.9, adult: Secondary | ICD-10-CM | POA: Insufficient documentation

## 2015-06-21 DIAGNOSIS — Z79899 Other long term (current) drug therapy: Secondary | ICD-10-CM | POA: Diagnosis not present

## 2015-06-21 DIAGNOSIS — Z9981 Dependence on supplemental oxygen: Secondary | ICD-10-CM | POA: Diagnosis not present

## 2015-06-21 DIAGNOSIS — Z7901 Long term (current) use of anticoagulants: Secondary | ICD-10-CM | POA: Diagnosis not present

## 2015-06-21 DIAGNOSIS — E669 Obesity, unspecified: Secondary | ICD-10-CM | POA: Insufficient documentation

## 2015-06-21 DIAGNOSIS — D500 Iron deficiency anemia secondary to blood loss (chronic): Secondary | ICD-10-CM | POA: Insufficient documentation

## 2015-06-21 DIAGNOSIS — Z7951 Long term (current) use of inhaled steroids: Secondary | ICD-10-CM | POA: Diagnosis not present

## 2015-06-21 DIAGNOSIS — Z7984 Long term (current) use of oral hypoglycemic drugs: Secondary | ICD-10-CM | POA: Diagnosis not present

## 2015-06-21 DIAGNOSIS — I2510 Atherosclerotic heart disease of native coronary artery without angina pectoris: Secondary | ICD-10-CM | POA: Diagnosis not present

## 2015-06-21 DIAGNOSIS — D509 Iron deficiency anemia, unspecified: Secondary | ICD-10-CM | POA: Diagnosis not present

## 2015-06-21 DIAGNOSIS — I10 Essential (primary) hypertension: Secondary | ICD-10-CM | POA: Insufficient documentation

## 2015-06-21 DIAGNOSIS — D649 Anemia, unspecified: Secondary | ICD-10-CM | POA: Diagnosis not present

## 2015-06-21 DIAGNOSIS — K449 Diaphragmatic hernia without obstruction or gangrene: Secondary | ICD-10-CM | POA: Insufficient documentation

## 2015-06-21 DIAGNOSIS — K219 Gastro-esophageal reflux disease without esophagitis: Secondary | ICD-10-CM | POA: Insufficient documentation

## 2015-06-21 DIAGNOSIS — I4891 Unspecified atrial fibrillation: Secondary | ICD-10-CM | POA: Insufficient documentation

## 2015-06-21 DIAGNOSIS — E119 Type 2 diabetes mellitus without complications: Secondary | ICD-10-CM | POA: Diagnosis not present

## 2015-06-21 DIAGNOSIS — E785 Hyperlipidemia, unspecified: Secondary | ICD-10-CM | POA: Diagnosis not present

## 2015-06-21 HISTORY — PX: ESOPHAGOGASTRODUODENOSCOPY (EGD) WITH PROPOFOL: SHX5813

## 2015-06-21 LAB — GLUCOSE, CAPILLARY: Glucose-Capillary: 127 mg/dL — ABNORMAL HIGH (ref 65–99)

## 2015-06-21 SURGERY — ESOPHAGOGASTRODUODENOSCOPY (EGD) WITH PROPOFOL
Anesthesia: Monitor Anesthesia Care

## 2015-06-21 MED ORDER — PROPOFOL 500 MG/50ML IV EMUL
INTRAVENOUS | Status: DC | PRN
  Administered 2015-06-21: 30 mg via INTRAVENOUS

## 2015-06-21 MED ORDER — PROPOFOL 500 MG/50ML IV EMUL
INTRAVENOUS | Status: DC | PRN
  Administered 2015-06-21: 150 ug/kg/min via INTRAVENOUS

## 2015-06-21 MED ORDER — SODIUM CHLORIDE 0.9 % IV SOLN: INTRAVENOUS | Status: DC

## 2015-06-21 MED ORDER — LACTATED RINGERS IV SOLN
INTRAVENOUS | Status: DC
  Administered 2015-06-21: 1000 mL via INTRAVENOUS
  Administered 2015-06-21: 10:00:00 via INTRAVENOUS

## 2015-06-21 MED ORDER — PROPOFOL 10 MG/ML IV BOLUS
INTRAVENOUS | Status: AC
  Filled 2015-06-21: qty 20

## 2015-06-21 SURGICAL SUPPLY — 14 items

## 2015-06-21 NOTE — Op Note (Signed)
[facility] [place], [address]   ENDOSCOPY PROCEDURE REPORT  PATIENT: Richard [name], Richard [name]  MR#: [id number] BIRTHDATE: Feb 18, 1931 , 84  yrs. old GENDER: male ENDOSCOPIST: Gatha Mayer, MD, [facility] PROCEDURE DATE:  06/21/2015 PROCEDURE:  EGD w/ biopsy ASA CLASS:     Class III INDICATIONS:  iron deficiency anemia. MEDICATIONS: Monitored anesthesia care and Per Anesthesia TOPICAL ANESTHETIC: none  DESCRIPTION OF PROCEDURE: After the risks benefits and alternatives of the procedure were thoroughly explained, informed consent was obtained.  The Pentax Gastroscope [id number] endoscope was introduced through the mouth and advanced to the second portion of the duodenum , Without limitations.  The instrument was slowly withdrawn as the mucosa was fully examined.    1) 12 mm antral polyp - biopsied  2) large hiatal hernia (8-10 cm) but no Cameron's ersosions 3) Otherwise normal.  Retroflexed views revealed a hiatal hernia.     The scope was then withdrawn from the patient and the procedure completed.  COMPLICATIONS: There were no immediate complications.  ENDOSCOPIC IMPRESSION: 1) 12 mm antral polyp - biopsied 2) large hiatal hernia (8-10 cm) but no Cameron's ersosions 3) Otherwise normal  RECOMMENDATIONS: 1.  Await biopsy results 2.  Office will call with results   eSigned:  Gatha Mayer, MD, [facility] 06/21/2015 11:16 AM    CC:The Patient, Ancil Linsey, MD  Berneta Sages, MD

## 2015-06-21 NOTE — H&P (View-Only) (Signed)
Referred by: Prince Solian, MD and Dr. Whitney Muse  Subjective:    Patient ID: Richard [name], male    DOB: [birth year], 81 y.o.   MRN: [id number] Chief complaint: Iron deficiency anemia HPI The patient is a very nice elderly white man here with his wife, he has been seen for anemia by Dr. Whitney Muse with thoughts of it being due to iron deficiency as his ferritin was low, as well as a polyclonal gammopathy and probable chronic inflammatory chronic disease state. He has been treated with ferriheme and his hemoglobin improved from about 7-10. He is complaining of dyspnea and weakness though it is better he is still frustrated by his reduced exercise tolerance. EGD 2005 large hiatal hernia and 12 mm antral polyp Colonoscopy 2005 hemorrhoids, hyperplastic polyps, diverticulosis. GI ROS otherwise negative Allergies  Allergen Reactions  . Codeine Other (See Comments)    Hallucinations   . Citalopram Swelling and Rash   Outpatient Prescriptions Prior to Visit  Medication Sig Dispense Refill  . acetaminophen (TYLENOL) 500 MG tablet Take 500 mg by mouth every 6 (six) hours as needed for mild pain.    Marland Kitchen amiodarone (PACERONE) 200 MG tablet Take 1 tablet (200 mg) on Monday, Wednesday, Friday only (Patient taking differently: Take 200 mg by mouth every Monday, Wednesday, and Friday. Take 1 tablet (200 mg) by mouth on Monday, Wednesday, Friday onl) 90 tablet 4  . amLODipine (NORVASC) 5 MG tablet TAKE 1 TABLET (5 MG TOTAL) BY MOUTH DAILY. 90 tablet 2  . benazepril (LOTENSIN) 40 MG tablet Take 40 mg by mouth daily.      . Cyanocobalamin (VITAMIN B-12 IJ) Inject as directed every 30 (thirty) days.     . diazepam (VALIUM) 5 MG tablet Take 5 mg by mouth at bedtime.      . fluticasone (FLONASE) 50 MCG/ACT nasal spray Place 1 spray into the nose daily.      . metFORMIN (GLUCOPHAGE) 1000 MG tablet Take 1,000 mg by mouth 2 (two) times daily with a meal.    . metoprolol (LOPRESSOR) 50 MG tablet TAKE 1 TABLET (50 MG  TOTAL) BY MOUTH 2 (TWO) TIMES DAILY. 60 tablet 2  . NON FORMULARY CPAP @@ night with Oxygen    . omeprazole (PRILOSEC) 20 MG capsule Take 20 mg by mouth daily.     . pioglitazone (ACTOS) 30 MG tablet Take 30 mg by mouth daily.      . rivaroxaban (XARELTO) 20 MG TABS tablet Take 1 tablet (20 mg total) by mouth daily with supper. 30 tablet 11  . sertraline (ZOLOFT) 100 MG tablet Take 1 tablet by mouth daily.    Marland Kitchen zinc gluconate 50 MG tablet Take 50 mg by mouth daily.     No facility-administered medications prior to visit.   Past Medical History  Diagnosis Date  . Hypertension   . Anxiety   . Microcytic anemia   . Hiatal hernia   . Skin cancer of lip   . Sinus drainage   . OSA on CPAP   . Type II diabetes mellitus ([place])   . GERD (gastroesophageal reflux disease)   . Arthritis   . Nephrolithiasis   . On home oxygen therapy     a. 2L w/CPAP at night  . [place] spotted fever ~ 1945  . LBBB (left bundle branch block)   . Hyperlipidemia   . Atrial fibrillation ([place])     a. Dx 03/2013, notes report atrial fibrillation/atrial flutter, placed on amiodarone. NSR in  subsequent OV's.  . Chest pain     a. H/o CTA negative for PE 2012, normal cath 2005, normal nucs previously including 05/2012.  . Orthostatic hypotension   . B12 deficiency anemia   . Complete heart block (HCC)     a. s/p Medtronic Adapta L model ADDRL 1 (serial number [id number]) pacemaker.  . Iron deficiency anemia    Past Surgical History  Procedure Laterality Date  . Vasectomy      Hx of   . Lumbar disc surgery  ~ 1993  . Inguinal hernia repair Right   . Cardiac catheterization      by Dr. Acie Fredrickson, January 24, 2004, that shows minimal coronary artery irregularities and normal left ventricular function  . Cataract extraction w/ intraocular lens  implant, bilateral Bilateral   . Skin cancer excision      "lower lip" (04/06/2013)  . Temporary pacemaker insertion N/A 05/31/2014    Procedure: TEMPORARY WIRE;   Surgeon: Sinclair Grooms, MD;  Location: [facility] CATH LAB;  Service: Cardiovascular;  Laterality: N/A;  . Permanent pacemaker insertion N/A 06/03/2014    MDT Adapta L implanted by Dr Rayann Heman for syncope and transient AV block   Social History   Social History  . Marital Status: Married    Spouse Name: N/A  . Number of Children: 2  . Years of Education: N/A   Occupational History  . retired    Social History Main Topics  . Smoking status: Former Smoker -- 0.00 packs/day for 20 years    Types: Cigarettes  . Smokeless tobacco: Never Used     Comment: 04/06/2013 "quit smoking years ago; didn't smoke that much; probably 20 years; 1ppd"  . Alcohol Use: No  . Drug Use: No  . Sexual Activity: No   Other Topics Concern  . None   Social History Narrative   Family History  Problem Relation Age of Onset  . Other      Parents both died of old age; other conditions not known  . Stroke Brother   . Breast cancer Sister   . Pneumonia Father   . Thyroid disease Mother     goiter       Review of Systems Fatigue, dyspnea on exertion, back pain, decreased hearing, decreased vision all other ROS negative    Objective:   Physical Exam @BP  140/70 mmHg  Pulse 68  Ht 6' 1.75" (1.873 m)  Wt 253 lb 2 oz (114.817 kg)  BMI 32.73 kg/m2@  General:  Well-developed, well-nourished and in no acute distress - obese Eyes:  anicteric. ENT:   Mouth and posterior pharynx free of lesions. + dentures Neck:   supple w/o thyromegaly or mass.  Lungs: Clear to auscultation bilaterally. Heart:  S1S2, no rubs, murmurs, gallops. Abdomen:  obesesoft, non-tender, no hepatosplenomegaly, hernia, or mass and BS+. Prominent abd wall vv Lymph:  no cervical or supraclavicular adenopathy. Extremities:   1+ pretibial edema, cyanosis or clubbing Skin   brown discoloartion pretiebial skin Neuro:  A&O x 3.  Psych:  appropriate mood and  Affect.   Data Reviewed: As above + hem onc notes 2016, labs 2016  Lab Results   Component Value Date   WBC 4.6 05/15/2015   HGB 10.4* 05/15/2015   HCT 33.2* 05/15/2015   MCV 93.5 05/15/2015   PLT 163 05/15/2015   Lab Results  Component Value Date   FERRITIN 28 05/15/2015      Assessment & Plan:  Anemia, iron deficiency -  Plan: 0.9 %  sodium chloride infusion, Ambulatory referral to Gastroenterology  Hiatal hernia    EGD will be performed to evaluate. The risks and benefits as well as alternatives of endoscopic procedure(s) have been discussed and reviewed. All questions answered. The patient agrees to proceed. Anticipate holding Xarelto 2 d before the EGD. Rare increased risk of stroke off Xarelto explained.  Probably having bloodloss from [facility] erosions or maybe a gastric polyp accentruated by xarelto He is not inclined to pursue a colonoscopy  Anemia iron-def component but also chronic dz, gammopathy.  FG:[id number] R, MD Ancil Linsey, MD

## 2015-06-21 NOTE — Anesthesia Postprocedure Evaluation (Signed)
  Anesthesia Post-op Note  Patient: Richard [name]  Procedure(s) Performed: Procedure(s) (LRB): ESOPHAGOGASTRODUODENOSCOPY (EGD) WITH PROPOFOL (N/A)  Patient Location: PACU  Anesthesia Type: MAC  Level of Consciousness: awake and alert   Airway and Oxygen Therapy: Patient Spontanous Breathing  Post-op Pain: mild  Post-op Assessment: Post-op Vital signs reviewed, Patient's Cardiovascular Status Stable, Respiratory Function Stable, Patent Airway and No signs of Nausea or vomiting  Last Vitals:  Filed Vitals:   06/21/15 1120  BP: 179/78  Pulse: 63  Temp:   Resp: 20    Post-op Vital Signs: stable   Complications: No apparent anesthesia complications

## 2015-06-21 NOTE — Transfer of Care (Signed)
Immediate Anesthesia Transfer of Care Note  Patient: Richard [name]  Procedure(s) Performed: Procedure(s): ESOPHAGOGASTRODUODENOSCOPY (EGD) WITH PROPOFOL (N/A)  Patient Location: PACU  Anesthesia Type:MAC  Level of Consciousness:  sedated, patient cooperative and responds to stimulation  Airway & Oxygen Therapy:Patient Spontanous Breathing and Patient connected to face mask oxgen  Post-op Assessment:  Report given to PACU RN and Post -op Vital signs reviewed and stable  Post vital signs:  Reviewed and stable  Last Vitals:  Filed Vitals:   06/21/15 0956  BP: 170/81  Pulse: 60  Temp: 36.7 C  Resp: 10    Complications: No apparent anesthesia complications

## 2015-06-21 NOTE — Interval H&P Note (Signed)
History and Physical Interval Note:  06/21/2015 10:09 AM  Richard [name]  has presented today for surgery, with the diagnosis of anemia, iron def  The various methods of treatment have been discussed with the patient and family. After consideration of risks, benefits and other options for treatment, the patient has consented to  Procedure(s): ESOPHAGOGASTRODUODENOSCOPY (EGD) WITH PROPOFOL (N/A) as a surgical intervention .  The patient's history has been reviewed, patient examined, no change in status, stable for surgery.  I have reviewed the patient's chart and labs.  Questions were answered to the patient's satisfaction.     Silvano Rusk

## 2015-06-21 NOTE — Discharge Instructions (Signed)
° °  I found a polyp in the stomach (took biopsies) and the hiatal hernia. I do not think I found the cause of blood loss/low iron.  I will call with biopsy results and plans.  I appreciate the opportunity to care for you. Gatha Mayer, MD, FACG  YOU HAD AN ENDOSCOPIC PROCEDURE TODAY: Refer to the procedure report and other information in the discharge instructions given to you for any specific questions about what was found during the examination. If this information does not answer your questions, please call Dr. Celesta Aver office at [phone] to clarify.   YOU SHOULD EXPECT: Some feelings of bloating in the abdomen. Passage of more gas than usual. Walking can help get rid of the air that was put into your GI tract during the procedure and reduce the bloating. If you had a lower endoscopy (such as a colonoscopy or flexible sigmoidoscopy) you may notice spotting of blood in your stool or on the toilet paper. Some abdominal soreness may be present for a day or two, also.  DIET: Your first meal following the procedure should be a light meal and then it is ok to progress to your normal diet. A half-sandwich or bowl of soup is an example of a good first meal. Heavy or fried foods are harder to digest and may make you feel nauseous or bloated. Drink plenty of fluids but you should avoid alcoholic beverages for 24 hours.   ACTIVITY: Your care partner should take you home directly after the procedure. You should plan to take it easy, moving slowly for the rest of the day. You can resume normal activity the day after the procedure however YOU SHOULD NOT DRIVE, use power tools, machinery or perform tasks that involve climbing or major physical exertion for 24 hours (because of the sedation medicines used during the test).   SYMPTOMS TO REPORT IMMEDIATELY: A gastroenterologist can be reached at any hour. Please call [phone]  for any of the following symptoms:   Following upper endoscopy (EGD,  EUS, ERCP, esophageal dilation) Vomiting of blood or coffee ground material  New, significant abdominal pain  New, significant chest pain or pain under the shoulder blades  Painful or persistently difficult swallowing  New shortness of breath  Black, tarry-looking or red, bloody stools  FOLLOW UP:  If any biopsies were taken you will be contacted by phone or by letter within the next 1-3 weeks. Call [phone]  if you have not heard about the biopsies in 3 weeks.  Please also call with any specific questions about appointments or follow up tests.

## 2015-06-21 NOTE — Anesthesia Preprocedure Evaluation (Addendum)
Anesthesia Evaluation  Patient identified by MRN, date of birth, ID band Patient awake    Reviewed: Allergy & Precautions, H&P , NPO status , Patient's Chart, lab work & pertinent test results, reviewed documented beta blocker date and time   Airway Mallampati: II  TM Distance: >3 FB Neck ROM: full    Dental  (+) Dental Advisory Given, Edentulous Upper, Edentulous Lower   Pulmonary sleep apnea, Continuous Positive Airway Pressure Ventilation and Oxygen sleep apnea , former smoker,    Pulmonary exam normal breath sounds clear to auscultation       Cardiovascular hypertension, Pt. on medications and Pt. on home beta blockers + angina + CAD  Normal cardiovascular exam+ dysrhythmias Atrial Fibrillation + pacemaker  Rhythm:regular Rate:Normal  LBBB   Neuro/Psych Anxiety negative neurological ROS  negative psych ROS   GI/Hepatic negative GI ROS, Neg liver ROS, hiatal hernia, GERD  Medicated and Controlled,  Endo/Other  diabetes, Well Controlled, Type 2, Oral Hypoglycemic Agents  Renal/GU negative Renal ROS  negative genitourinary   Musculoskeletal   Abdominal   Peds  Hematology negative hematology ROS (+) anemia ,   Anesthesia Other Findings   Reproductive/Obstetrics negative OB ROS                           Anesthesia Physical Anesthesia Plan  ASA: III  Anesthesia Plan: MAC   Post-op Pain Management:    Induction:   Airway Management Planned:   Additional Equipment:   Intra-op Plan:   Post-operative Plan:   Informed Consent: I have reviewed the patients History and Physical, chart, labs and discussed the procedure including the risks, benefits and alternatives for the proposed anesthesia with the patient or authorized representative who has indicated his/her understanding and acceptance.   Dental Advisory Given  Plan Discussed with: CRNA and Surgeon  Anesthesia Plan Comments:          Anesthesia Quick Evaluation

## 2015-06-22 ENCOUNTER — Encounter (HOSPITAL_COMMUNITY): Payer: Self-pay | Admitting: Internal Medicine

## 2015-06-24 NOTE — Progress Notes (Signed)
Quick Note:  Let him know no cancer - nothing bad here -  Ask him if he has changed his mind about a colonoscopy - for completeness he should have one. If he is not willing to do one then put him in for next available f/u If he is please arrange - think he is ok for [place] and it is ok to hold Xarelto like we did for EGD - ______

## 2015-06-26 ENCOUNTER — Other Ambulatory Visit (HOSPITAL_COMMUNITY): Payer: Self-pay

## 2015-06-26 DIAGNOSIS — D509 Iron deficiency anemia, unspecified: Secondary | ICD-10-CM

## 2015-06-27 ENCOUNTER — Ambulatory Visit (INDEPENDENT_AMBULATORY_CARE_PROVIDER_SITE_OTHER): Payer: Medicare Other | Admitting: *Deleted

## 2015-06-27 DIAGNOSIS — I442 Atrioventricular block, complete: Secondary | ICD-10-CM

## 2015-06-27 DIAGNOSIS — M5416 Radiculopathy, lumbar region: Secondary | ICD-10-CM | POA: Diagnosis not present

## 2015-06-27 DIAGNOSIS — M5136 Other intervertebral disc degeneration, lumbar region: Secondary | ICD-10-CM | POA: Diagnosis not present

## 2015-06-28 ENCOUNTER — Telehealth: Payer: Self-pay | Admitting: Cardiovascular Disease

## 2015-06-28 ENCOUNTER — Encounter (HOSPITAL_COMMUNITY): Payer: Medicare Other | Attending: Hematology & Oncology

## 2015-06-28 DIAGNOSIS — H04123 Dry eye syndrome of bilateral lacrimal glands: Secondary | ICD-10-CM | POA: Insufficient documentation

## 2015-06-28 DIAGNOSIS — D509 Iron deficiency anemia, unspecified: Secondary | ICD-10-CM

## 2015-06-28 DIAGNOSIS — R5383 Other fatigue: Secondary | ICD-10-CM | POA: Diagnosis not present

## 2015-06-28 DIAGNOSIS — R682 Dry mouth, unspecified: Secondary | ICD-10-CM | POA: Insufficient documentation

## 2015-06-28 DIAGNOSIS — D508 Other iron deficiency anemias: Secondary | ICD-10-CM | POA: Insufficient documentation

## 2015-06-28 LAB — CBC WITH DIFFERENTIAL/PLATELET
BASOS ABS: 0.1 10*3/uL (ref 0.0–0.1)
BASOS PCT: 1 %
Eosinophils Absolute: 0.2 10*3/uL (ref 0.0–0.7)
Eosinophils Relative: 4 %
HCT: 35.6 % — ABNORMAL LOW (ref 39.0–52.0)
Hemoglobin: 11.1 g/dL — ABNORMAL LOW (ref 13.0–17.0)
LYMPHS PCT: 20 %
Lymphs Abs: 0.9 10*3/uL (ref 0.7–4.0)
MCH: 30 pg (ref 26.0–34.0)
MCHC: 31.2 g/dL (ref 30.0–36.0)
MCV: 96.2 fL (ref 78.0–100.0)
MONO ABS: 0.5 10*3/uL (ref 0.1–1.0)
MONOS PCT: 10 %
NEUTROS ABS: 2.9 10*3/uL (ref 1.7–7.7)
NEUTROS PCT: 65 %
PLATELETS: 155 10*3/uL (ref 150–400)
RBC: 3.7 MIL/uL — AB (ref 4.22–5.81)
RDW: 16.9 % — AB (ref 11.5–15.5)
WBC: 4.5 10*3/uL (ref 4.0–10.5)

## 2015-06-28 LAB — FERRITIN: Ferritin: 79 ng/mL (ref 24–336)

## 2015-06-28 NOTE — Telephone Encounter (Signed)
Clearance placed in HIM for faxing

## 2015-06-28 NOTE — Telephone Encounter (Signed)
OK to hold Xarelto 3 days prior to back injection

## 2015-06-28 NOTE — Telephone Encounter (Signed)
New message    Request for surgical clearance:  1. What type of surgery is being performed?  Back injection    2. When is this surgery scheduled? 11.11.2016   3. Are there any medications that need to be held prior to surgery and how long? xarelto 3 days before   4. Name of physician performing surgery? Dr. Nelva Bush   5. What is your office phone and fax number? [phone] / ext [phone]. fax [phone]

## 2015-06-29 NOTE — Progress Notes (Signed)
LABS DRAWN

## 2015-06-29 NOTE — Progress Notes (Signed)
Remote pacemaker transmission.   

## 2015-07-05 DIAGNOSIS — E538 Deficiency of other specified B group vitamins: Secondary | ICD-10-CM | POA: Diagnosis not present

## 2015-07-06 ENCOUNTER — Other Ambulatory Visit: Payer: Self-pay | Admitting: Cardiovascular Disease

## 2015-07-06 DIAGNOSIS — M5136 Other intervertebral disc degeneration, lumbar region: Secondary | ICD-10-CM | POA: Diagnosis not present

## 2015-07-06 DIAGNOSIS — M5116 Intervertebral disc disorders with radiculopathy, lumbar region: Secondary | ICD-10-CM | POA: Diagnosis not present

## 2015-07-06 DIAGNOSIS — M545 Low back pain: Secondary | ICD-10-CM | POA: Diagnosis not present

## 2015-07-06 DIAGNOSIS — M5416 Radiculopathy, lumbar region: Secondary | ICD-10-CM | POA: Diagnosis not present

## 2015-07-06 DIAGNOSIS — M961 Postlaminectomy syndrome, not elsewhere classified: Secondary | ICD-10-CM | POA: Diagnosis not present

## 2015-07-06 DIAGNOSIS — M4806 Spinal stenosis, lumbar region: Secondary | ICD-10-CM | POA: Diagnosis not present

## 2015-07-13 LAB — CUP PACEART REMOTE DEVICE CHECK
Battery Remaining Longevity: 160 mo
Date Time Interrogation Session: 20161102112509
Implantable Lead Implant Date: 20151010
Lead Channel Pacing Threshold Amplitude: 0.625 V
Lead Channel Pacing Threshold Pulse Width: 0.4 ms
Lead Channel Setting Pacing Amplitude: 2 V
Lead Channel Setting Pacing Amplitude: 2.5 V
Lead Channel Setting Pacing Pulse Width: 0.4 ms
Lead Channel Setting Sensing Sensitivity: 5.6 mV
MDC IDC LEAD IMPLANT DT: 20151010
MDC IDC LEAD LOCATION: 753859
MDC IDC LEAD LOCATION: 753860
MDC IDC MSMT BATTERY IMPEDANCE: 100 Ohm
MDC IDC MSMT BATTERY VOLTAGE: 2.8 V
MDC IDC MSMT LEADCHNL RA IMPEDANCE VALUE: 482 Ohm
MDC IDC MSMT LEADCHNL RA PACING THRESHOLD AMPLITUDE: 0.625 V
MDC IDC MSMT LEADCHNL RA PACING THRESHOLD PULSEWIDTH: 0.4 ms
MDC IDC MSMT LEADCHNL RA SENSING INTR AMPL: 0.7 mV
MDC IDC MSMT LEADCHNL RV IMPEDANCE VALUE: 842 Ohm
MDC IDC MSMT LEADCHNL RV SENSING INTR AMPL: 16 mV
MDC IDC STAT BRADY AP VP PERCENT: 1 %
MDC IDC STAT BRADY AP VS PERCENT: 16 %
MDC IDC STAT BRADY AS VP PERCENT: 6 %
MDC IDC STAT BRADY AS VS PERCENT: 77 %

## 2015-07-17 ENCOUNTER — Ambulatory Visit (INDEPENDENT_AMBULATORY_CARE_PROVIDER_SITE_OTHER): Payer: Medicare Other | Admitting: Cardiovascular Disease

## 2015-07-17 ENCOUNTER — Encounter: Payer: Self-pay | Admitting: Cardiovascular Disease

## 2015-07-17 ENCOUNTER — Encounter: Payer: Self-pay | Admitting: Cardiology

## 2015-07-17 VITALS — BP 128/68 | HR 60 | Ht 73.75 in | Wt 248.0 lb

## 2015-07-17 DIAGNOSIS — I10 Essential (primary) hypertension: Secondary | ICD-10-CM

## 2015-07-17 DIAGNOSIS — I4891 Unspecified atrial fibrillation: Secondary | ICD-10-CM | POA: Diagnosis not present

## 2015-07-17 NOTE — Patient Instructions (Signed)

## 2015-07-17 NOTE — Progress Notes (Signed)
Cardiology Office Note   Date:  07/17/2015   ID:  Richard [name], DOB Feb 28, 1931, MRN [id number]  PCP:  Tivis Ringer, MD  Cardiologist:   Thayer Headings, MD   Chief Complaint  Patient presents with  . Follow-up    Problem List 1. Atrial fib 2. Essential Hypertension 3. Microcytic anemia  4. Pacer 5, diabetes Mellitus    Jan 17, 2015:  Richard [name] is a 81 y.o. male who presents for  His atrial fib Having lots of problems with anemia.  No blood in stool Being set up to see a hematologist  Still eating salt frequently  Nov. 22, 2016:    Doing well from a cardiac standpoint. Having back pain . , had an injection in his back . Having problems with anemia    Past Medical History  Diagnosis Date  . Hypertension   . Anxiety   . Hiatal hernia   . Skin cancer of lip   . Sinus drainage   . Type II diabetes mellitus ([place])   . GERD (gastroesophageal reflux disease)   . Arthritis   . Nephrolithiasis   . On home oxygen therapy     a. 2L w/CPAP at night  . [place] spotted fever ~ 1945  . LBBB (left bundle branch block)   . Hyperlipidemia   . Atrial fibrillation ([place])     a. Dx 03/2013, notes report atrial fibrillation/atrial flutter, placed on amiodarone. NSR in subsequent OV's.  . Chest pain     a. H/o CTA negative for PE 2012, normal cath 2005, normal nucs previously including 05/2012.  . Orthostatic hypotension   . Complete heart block (HCC)     a. s/p Medtronic Adapta L model ADDRL 1 (serial number [id number]) pacemaker.  . OSA on CPAP     cpap  . Microcytic anemia   . B12 deficiency anemia   . Iron deficiency anemia     Past Surgical History  Procedure Laterality Date  . Vasectomy      Hx of   . Lumbar disc surgery  ~ 1993  . Inguinal hernia repair Right   . Cardiac catheterization      by Dr. Acie Fredrickson, January 24, 2004, that shows minimal coronary artery irregularities and normal left ventricular function  . Cataract extraction w/  intraocular lens  implant, bilateral Bilateral   . Skin cancer excision      "lower lip" (04/06/2013)  . Temporary pacemaker insertion N/A 05/31/2014    Procedure: TEMPORARY WIRE;  Surgeon: Sinclair Grooms, MD;  Location[facility] CATH LAB;  Service: Cardiovascular;  Laterality: N/A;  . Permanent pacemaker insertion N/A 06/03/2014    MDT Adapta L implanted by Dr Rayann Heman for syncope and transient AV block  . Esophagogastroduodenoscopy (egd) with propofol N/A 06/21/2015    Procedure: ESOPHAGOGASTRODUODENOSCOPY (EGD) WITH PROPOFOL;  Surgeon: Gatha Mayer, MD;  Location: [facility] ENDOSCOPY;  Service: Endoscopy;  Laterality: N/A;     Current Outpatient Prescriptions  Medication Sig Dispense Refill  . acetaminophen (TYLENOL) 500 MG tablet Take 500 mg by mouth every 6 (six) hours as needed for mild pain.    Marland Kitchen amiodarone (PACERONE) 200 MG tablet Take 1 tablet (200 mg) on Monday, Wednesday, Friday only (Patient taking differently: Take 200 mg by mouth every Monday, Wednesday, and Friday. Take 1 tablet (200 mg) by mouth on Monday, Wednesday, Friday onl) 90 tablet 4  . amLODipine (NORVASC) 5 MG tablet TAKE 1 TABLET (5 MG TOTAL)  BY MOUTH DAILY. 90 tablet 2  . benazepril (LOTENSIN) 40 MG tablet Take 40 mg by mouth daily.      . Cyanocobalamin (VITAMIN B-12 IJ) Inject as directed every 30 (thirty) days.     . diazepam (VALIUM) 5 MG tablet Take 5 mg by mouth at bedtime.      . fluticasone (FLONASE) 50 MCG/ACT nasal spray Place 1 spray into the nose daily.      . metFORMIN (GLUCOPHAGE) 1000 MG tablet Take 1,000 mg by mouth 2 (two) times daily with a meal.    . metoprolol (LOPRESSOR) 50 MG tablet TAKE 1 TABLET BY MOUTH TWICE A DAY 60 tablet 11  . NON FORMULARY CPAP @@ night with Oxygen    . omeprazole (PRILOSEC) 20 MG capsule Take 20 mg by mouth daily.     . pioglitazone (ACTOS) 30 MG tablet Take 30 mg by mouth daily.      . rivaroxaban (XARELTO) 20 MG TABS tablet Take 1 tablet (20 mg total) by mouth daily with  supper. 30 tablet 11  . sertraline (ZOLOFT) 100 MG tablet Take 1 tablet by mouth daily.    Marland Kitchen zinc gluconate 50 MG tablet Take 50 mg by mouth daily.     No current facility-administered medications for this visit.    Allergies:   Codeine and Citalopram    Social History:  The patient  reports that he has quit smoking. His smoking use included Cigarettes. He smoked 0.00 packs per day for 20 years. He has never used smokeless tobacco. He reports that he does not drink alcohol or use illicit drugs.   Family History:  The patient's family history includes Breast cancer in his sister; Other in an other family member; Pneumonia in his father; Stroke in his brother; Thyroid disease in his mother.    ROS:  Please see the history of present illness.    Review of Systems: Constitutional:  denies fever, chills, diaphoresis, appetite change and fatigue.  HEENT: denies photophobia, eye pain, redness, hearing loss, ear pain, congestion, sore throat, rhinorrhea, sneezing, neck pain, neck stiffness and tinnitus.  Respiratory: denies SOB, DOE, cough, chest tightness, and wheezing.  Cardiovascular: denies chest pain, palpitations and leg swelling.  Gastrointestinal: denies nausea, vomiting, abdominal pain, diarrhea, constipation, blood in stool.  Genitourinary: denies dysuria, urgency, frequency, hematuria, flank pain and difficulty urinating.  Musculoskeletal: denies  myalgias, back pain, joint swelling, arthralgias and gait problem.   Skin: denies pallor, rash and wound.  Neurological: denies dizziness, seizures, syncope, weakness, light-headedness, numbness and headaches.   Hematological: denies adenopathy, easy bruising, personal or family bleeding history.  Psychiatric/ Behavioral: denies suicidal ideation, mood changes, confusion, nervousness, sleep disturbance and agitation.       All other systems are reviewed and negative.    PHYSICAL EXAM: VS:  BP 128/68 mmHg  Pulse 60  Ht 6' 1.75"  (1.873 m)  Wt 248 lb (112.492 kg)  BMI 32.07 kg/m2 , BMI Body mass index is 32.07 kg/(m^2). GEN: Well nourished, well developed, in no acute distress HEENT: normal Neck: no JVD, carotid bruits, or masses Cardiac: RRR; no murmurs, rubs, or gallops,no edema  Respiratory:  clear to auscultation bilaterally, normal work of breathing GI: soft, nontender, nondistended, + BS MS: no deformity or atrophy Skin: warm and dry, no rash Neuro:  Strength and sensation are intact Psych: normal   EKG:  EKG is ordered today. A-paced with prolonged AV conduction  LBBB .    Recent Labs: 05/15/2015: ALT 21;  BUN 21*; Creatinine, Ser 1.19; Potassium 4.5; Sodium 142 06/28/2015: Hemoglobin 11.1*; Platelets 155    Lipid Panel    Component Value Date/Time   CHOL 111 06/01/2014 0440   TRIG 159* 06/01/2014 0440   HDL 31* 06/01/2014 0440   CHOLHDL 3.6 06/01/2014 0440   VLDL 32 06/01/2014 0440   LDLCALC 48 06/01/2014 0440      Wt Readings from Last 3 Encounters:  07/17/15 248 lb (112.492 kg)  06/21/15 253 lb (114.76 kg)  06/04/15 253 lb 2 oz (114.817 kg)      Other studies Reviewed: Additional studies/ records that were reviewed today include: . Review of the above records demonstrates:    ASSESSMENT AND PLAN:  1. Atrial fib- his rate is well-controlled. He now has a pacemaker. He is currently on Xarelto. He's having problems with anemia.  2. Essential Hypertension - blood pressure is well controlled.   Continue current meds.   3. Microcytic anemia  - we'll be seeing a hematologist soon. 4. Pacer - followed by Dr. Rayann Heman   5, diabetes Mellitus  - followed by his medical doctor    Current medicines are reviewed at length with the patient today.  The patient does not have concerns regarding medicines.  The following changes have been made:  no change  Labs/ tests ordered today include:  No orders of the defined types were placed in this encounter.     Disposition:   FU with me in 6  months      Nahser, Wonda Cheng, MD  07/17/2015 10:03 AM    [facility] [place], [place], [place]  [address] Phone: [phone]; Fax: [phone]   [facility]  [address] [place], [place]  [address] [phone]    Fax [phone]

## 2015-07-20 ENCOUNTER — Other Ambulatory Visit: Payer: Self-pay | Admitting: Cardiovascular Disease

## 2015-08-03 ENCOUNTER — Other Ambulatory Visit: Payer: Self-pay | Admitting: Cardiovascular Disease

## 2015-08-09 DIAGNOSIS — M5380 Other specified dorsopathies, site unspecified: Secondary | ICD-10-CM | POA: Diagnosis not present

## 2015-08-09 DIAGNOSIS — N183 Chronic kidney disease, stage 3 (moderate): Secondary | ICD-10-CM | POA: Diagnosis not present

## 2015-08-09 DIAGNOSIS — I480 Paroxysmal atrial fibrillation: Secondary | ICD-10-CM | POA: Diagnosis not present

## 2015-08-09 DIAGNOSIS — R531 Weakness: Secondary | ICD-10-CM | POA: Diagnosis not present

## 2015-08-09 DIAGNOSIS — E1129 Type 2 diabetes mellitus with other diabetic kidney complication: Secondary | ICD-10-CM | POA: Diagnosis not present

## 2015-08-09 DIAGNOSIS — Z6831 Body mass index (BMI) 31.0-31.9, adult: Secondary | ICD-10-CM | POA: Diagnosis not present

## 2015-08-09 DIAGNOSIS — D6489 Other specified anemias: Secondary | ICD-10-CM | POA: Diagnosis not present

## 2015-08-15 ENCOUNTER — Encounter (HOSPITAL_COMMUNITY): Payer: Medicare Other | Attending: Hematology & Oncology | Admitting: Hematology & Oncology

## 2015-08-15 ENCOUNTER — Encounter (HOSPITAL_BASED_OUTPATIENT_CLINIC_OR_DEPARTMENT_OTHER): Payer: Medicare Other

## 2015-08-15 ENCOUNTER — Encounter (HOSPITAL_COMMUNITY): Payer: Self-pay | Admitting: Hematology & Oncology

## 2015-08-15 VITALS — BP 156/73 | HR 62 | Temp 97.5°F | Resp 20 | Wt 257.0 lb

## 2015-08-15 DIAGNOSIS — H04123 Dry eye syndrome of bilateral lacrimal glands: Secondary | ICD-10-CM | POA: Diagnosis not present

## 2015-08-15 DIAGNOSIS — D508 Other iron deficiency anemias: Secondary | ICD-10-CM | POA: Diagnosis not present

## 2015-08-15 DIAGNOSIS — R682 Dry mouth, unspecified: Secondary | ICD-10-CM | POA: Insufficient documentation

## 2015-08-15 DIAGNOSIS — N183 Chronic kidney disease, stage 3 (moderate): Secondary | ICD-10-CM

## 2015-08-15 DIAGNOSIS — D649 Anemia, unspecified: Secondary | ICD-10-CM | POA: Diagnosis not present

## 2015-08-15 DIAGNOSIS — D509 Iron deficiency anemia, unspecified: Secondary | ICD-10-CM

## 2015-08-15 DIAGNOSIS — R5383 Other fatigue: Secondary | ICD-10-CM | POA: Insufficient documentation

## 2015-08-15 LAB — CBC WITH DIFFERENTIAL/PLATELET
BASOS PCT: 1 %
Basophils Absolute: 0.1 10*3/uL (ref 0.0–0.1)
Eosinophils Absolute: 0.2 10*3/uL (ref 0.0–0.7)
Eosinophils Relative: 4 %
HEMATOCRIT: 34.2 % — AB (ref 39.0–52.0)
HEMOGLOBIN: 10.7 g/dL — AB (ref 13.0–17.0)
LYMPHS PCT: 14 %
Lymphs Abs: 0.8 10*3/uL (ref 0.7–4.0)
MCH: 30.2 pg (ref 26.0–34.0)
MCHC: 31.3 g/dL (ref 30.0–36.0)
MCV: 96.6 fL (ref 78.0–100.0)
MONO ABS: 0.7 10*3/uL (ref 0.1–1.0)
MONOS PCT: 12 %
NEUTROS ABS: 4.2 10*3/uL (ref 1.7–7.7)
NEUTROS PCT: 71 %
Platelets: 164 10*3/uL (ref 150–400)
RBC: 3.54 MIL/uL — AB (ref 4.22–5.81)
RDW: 16.1 % — ABNORMAL HIGH (ref 11.5–15.5)
WBC: 5.9 10*3/uL (ref 4.0–10.5)

## 2015-08-15 LAB — SEDIMENTATION RATE: Sed Rate: 60 mm/hr — ABNORMAL HIGH (ref 0–16)

## 2015-08-15 NOTE — Progress Notes (Signed)
[facility] [place] NOTE  Patient Care Team: Prince Solian, MD as PCP - General (Internal Medicine) Thayer Headings, MD as Consulting Physician (Cardiology)  CHIEF COMPLAINTS/PURPOSE OF CONSULTATION:  Anemia Iron deficiency Elevated ESR, RF  HISTORY OF PRESENTING ILLNESS:  Richard [name] 81 y.o. male is here because of Anemia. Last dose of IV iron was given in September.   Mr. Kahrs returns to the [facility] today with his wife.   He says that lately he's been up to nothing much, "just piddling."  After his recent falls, they gave him two shots in his back. He says the first didn't help, so they gave him another. He remarks that his legs "feel better, but they sure get tired." He says he guesses this is due to old age.  He recently saw Dr. Trudie Reed.  Mr. Canipe says that, in general, he's feeling better. He was sweeping leaves yesterday, and "rides the lawnmower and takes it easy." His wife states that he's "feeling better than he was and can walk to the mailbox and back now." Mr. Vanwagner confirms that his fatigue is better.  He denies any neuropathy, denies any problems with his urine, and confirms with some amusement that he's eating well and gaining weight. Mr. Maltos also confirms that his bowels have been okay, and that his breathing has been okay.  MEDICAL HISTORY:  Past Medical History  Diagnosis Date  . Hypertension   . Anxiety   . Hiatal hernia   . Skin cancer of lip   . Sinus drainage   . Type II diabetes mellitus ([place])   . GERD (gastroesophageal reflux disease)   . Arthritis   . Nephrolithiasis   . On home oxygen therapy     a. 2L w/CPAP at night  . [place] spotted fever ~ 1945  . LBBB (left bundle branch block)   . Hyperlipidemia   . Atrial fibrillation ([place])     a. Dx 03/2013, notes report atrial fibrillation/atrial flutter, placed on amiodarone. NSR in subsequent OV's.  . Chest pain     a. H/o CTA negative  for PE 2012, normal cath 2005, normal nucs previously including 05/2012.  . Orthostatic hypotension   . Complete heart block (HCC)     a. s/p Medtronic Adapta L model ADDRL 1 (serial number [id number]) pacemaker.  . OSA on CPAP     cpap  . Microcytic anemia   . B12 deficiency anemia   . Iron deficiency anemia     SURGICAL HISTORY: Past Surgical History  Procedure Laterality Date  . Vasectomy      Hx of   . Lumbar disc surgery  ~ 1993  . Inguinal hernia repair Right   . Cardiac catheterization      by Dr. Acie Fredrickson, January 24, 2004, that shows minimal coronary artery irregularities and normal left ventricular function  . Cataract extraction w/ intraocular lens  implant, bilateral Bilateral   . Skin cancer excision      "lower lip" (04/06/2013)  . Temporary pacemaker insertion N/A 05/31/2014    Procedure: TEMPORARY WIRE;  Surgeon: Sinclair Grooms, MD;  Location: [facility] CATH LAB;  Service: Cardiovascular;  Laterality: N/A;  . Permanent pacemaker insertion N/A 06/03/2014    MDT Adapta L implanted by Dr Rayann Heman for syncope and transient AV block  . Esophagogastroduodenoscopy (egd) with propofol N/A 06/21/2015    Procedure: ESOPHAGOGASTRODUODENOSCOPY (EGD) WITH PROPOFOL;  Surgeon: Gatha Mayer, MD;  Location: [facility] END[facility];  Service: Endoscopy;  Laterality: N/A;    SOCIAL HISTORY: Social History   Social History  . Marital Status: Married    Spouse Name: N/A  . Number of Children: 2  . Years of Education: N/A   Occupational History  . retired    Social History Main Topics  . Smoking status: Former Smoker -- 0.00 packs/day for 20 years    Types: Cigarettes  . Smokeless tobacco: Never Used     Comment: 04/06/2013 "quit smoking years ago; didn't smoke that much; probably 20 years; 1ppd"  . Alcohol Use: No  . Drug Use: No  . Sexual Activity: No   Other Topics Concern  . Not on file   Social History Narrative  2 children. Married for 58 years. 6 grandchildren 3 great  grandchildren Ex-smoker, quit over 30 yrs ago  ETOH, none Used to play golf, used to work at [employer]: Family History  Problem Relation Age of Onset  . Other      Parents both died of old age; other conditions not known  . Stroke Brother   . Breast cancer Sister   . Pneumonia Father   . Thyroid disease Mother     goiter   indicated that his mother is deceased. He indicated that his father is deceased.    Mother passed at 75 of maybe pnenomia, she was in a nursing home Father passed when he was 10 Has 48 brothers and sister, 3 passed Sister has breast cancer   ALLERGIES:  is allergic to codeine and citalopram.  MEDICATIONS:  Current Outpatient Prescriptions  Medication Sig Dispense Refill  . acetaminophen (TYLENOL) 500 MG tablet Take 500 mg by mouth every 6 (six) hours as needed for mild pain.    Marland Kitchen amiodarone (PACERONE) 200 MG tablet Take 1 tablet (200 mg) on Monday, Wednesday, Friday only (Patient taking differently: Take 200 mg by mouth every Monday, Wednesday, and Friday. Take 1 tablet (200 mg) by mouth on Monday, Wednesday, Friday onl) 90 tablet 4  . amLODipine (NORVASC) 5 MG tablet TAKE 1 TABLET (5 MG TOTAL) BY MOUTH DAILY. 90 tablet 2  . benazepril (LOTENSIN) 40 MG tablet Take 40 mg by mouth daily.      . Cyanocobalamin (VITAMIN B-12 IJ) Inject as directed every 30 (thirty) days.     . diazepam (VALIUM) 5 MG tablet Take 5 mg by mouth at bedtime.      . fluticasone (FLONASE) 50 MCG/ACT nasal spray Place 1 spray into the nose daily.      . metFORMIN (GLUCOPHAGE) 1000 MG tablet Take 1,000 mg by mouth 2 (two) times daily with a meal.    . metoprolol (LOPRESSOR) 50 MG tablet TAKE 1 TABLET BY MOUTH TWICE A DAY 60 tablet 11  . NON FORMULARY CPAP @@ night with Oxygen    . omeprazole (PRILOSEC) 20 MG capsule Take 20 mg by mouth daily.     . pioglitazone (ACTOS) 30 MG tablet Take 30 mg by mouth daily.      . sertraline (ZOLOFT) 100 MG tablet Take 1 tablet by  mouth daily.    Alveda Reasons 20 MG TABS tablet TAKE 1 TABLET (20 MG TOTAL) BY MOUTH DAILY WITH SUPPER. 30 tablet 11   No current facility-administered medications for this visit.    Review of Systems  Constitutional: Positive for malaise/fatigue, but improved HENT: Negative.  Edentulous  Eyes:       Dry eyes, takes medication  Respiratory: Negative.   Cardiovascular: Negative.   Gastrointestinal: Negative.   Genitourinary:  Musculoskeletal: Positive for myalgias and joint pain.  Skin: Negative.   Neurological: Positive for dizziness and weakness.       Improved since pacemaker placed  Endo/Heme/Allergies: Negative.   Psychiatric/Behavioral: Negative.   All other systems reviewed and are negative.  14 point ROS was done and is otherwise as detailed above or in HPI   PHYSICAL EXAMINATION: ECOG PERFORMANCE STATUS: 1 - Symptomatic but completely ambulatory  Filed Vitals:   08/15/15 1100  BP: 156/73  Pulse: 62  Temp: 97.5 F (36.4 C)  Resp: 20   Filed Weights   08/15/15 1100  Weight: 257 lb (116.574 kg)    Physical Exam  Constitutional: He is oriented to person, place, and time and well-developed, well-nourished, and in no distress.  HENT: Head: Normocephalic and atraumatic.  Right Ear: External ear normal.  Left Ear: External ear normal.  Nose: Nose normal.  Mouth/Throat: Oropharynx is clear and moist. No oropharyngeal exudate.  Eyes: Conjunctivae and EOM are normal. Pupils are equal, round, and reactive to light. Right eye exhibits no discharge. Left eye exhibits no discharge. No scleral icterus.  Dry eyes  Neck: Normal range of motion. Neck supple. No tracheal deviation present. No thyromegaly present.  Cardiovascular: Normal rate, regular rhythm, normal heart sounds and intact distal pulses.  Exam reveals no gallop and no friction rub.   No murmur heard. Pacemaker  Pulmonary/Chest: Effort normal and breath sounds normal. He has no wheezes. He has no rales.   Abdominal: Soft. Bowel sounds are normal. He exhibits no distension and no mass. There is no tenderness. There is no rebound and no guarding.  Musculoskeletal: Normal range of motion. He exhibits no edema.  Lymphadenopathy:    He has no cervical adenopathy.  Neurological: He is alert and oriented to person, place, and time. He has normal reflexes. No cranial nerve deficit. Gait normal but slow.   Skin: Skin is warm and dry. No rash noted.  Psychiatric: Mood, memory, affect and judgment normal.  Nursing note and vitals reviewed.   LABORATORY DATA:  I have reviewed the data as listed.  CBC    Component Value Date/Time   WBC 5.9 08/15/2015 1100   RBC 3.54* 08/15/2015 1100   RBC 3.19* 03/13/2015 1245   HGB 10.7* 08/15/2015 1100   HCT 34.2* 08/15/2015 1100   PLT 164 08/15/2015 1100   MCV 96.6 08/15/2015 1100   MCH 30.2 08/15/2015 1100   MCHC 31.3 08/15/2015 1100   RDW 16.1* 08/15/2015 1100   LYMPHSABS 0.8 08/15/2015 1100   MONOABS 0.7 08/15/2015 1100   EOSABS 0.2 08/15/2015 1100   BASOSABS 0.1 08/15/2015 1100   CMP     Component Value Date/Time   NA 142 05/15/2015 1233   K 4.5 05/15/2015 1233   CL 105 05/15/2015 1233   CO2 30 05/15/2015 1233   GLUCOSE 162* 05/15/2015 1233   BUN 21* 05/15/2015 1233   CREATININE 1.19 05/15/2015 1233   CALCIUM 8.6* 05/15/2015 1233   PROT 7.5 05/15/2015 1233   ALBUMIN 3.5 05/15/2015 1233   AST 33 05/15/2015 1233   ALT 21 05/15/2015 1233   ALKPHOS 64 05/15/2015 1233   BILITOT 0.5 05/15/2015 1233   GFRNONAA 55* 05/15/2015 1233   GFRAA >60 05/15/2015 1233     ASSESSMENT & PLAN:  Anemia CKD, Stage III Fatigue Markedly elevated ESR, rheumatoid factor Severe iron deficiency with serum ferritin of 16 NG/ML Last GI  evaluation approximately 10-15 years ago Polyclonal gammopathy, abnormal kappa lambda light chain ratio [place]  CBC is improved and he is clinically better.  We will move his visits out to 6 months, with blood work every 3  months to keep an eye on his iron levels.  Once his results come back, we will notify the patient of his iron levels and arrange for IV iron if needed. He has seen Dr. Carlean Purl his GI physician in follow-up for his iron deficiency. The patient has currently declined a repeat C-scope.   Orders Placed This Encounter  Procedures  . CBC with Differential    Standing Status: Standing     Number of Occurrences: 6     Standing Expiration Date: 08/14/2016  . Ferritin    Standing Status: Standing     Number of Occurrences: 6     Standing Expiration Date: 08/14/2016  . Iron and TIBC    Standing Status: Standing     Number of Occurrences: 6     Standing Expiration Date: 08/14/2016   All questions were answered. The patient knows to call the clinic with any problems, questions or concerns.  This note was electronically signed.   This document serves as a record of services personally performed by Ancil Linsey, MD. It was created on her behalf by Toni Amend, a trained medical scribe. The creation of this record is based on the scribe's personal observations and the provider's statements to them. This document has been checked and approved by the attending provider.  I have reviewed the above documentation for accuracy and completeness, and I agree with the above.  Kelby Fam. Whitney Muse, MD

## 2015-08-15 NOTE — Patient Instructions (Addendum)
[place] at [facility] Discharge Instructions  RECOMMENDATIONS MADE BY THE CONSULTANT AND ANY TEST RESULTS WILL BE SENT TO YOUR REFERRING PHYSICIAN.  Exam completed by Dr Whitney Muse today Ferritin level is still pending we will call you with those results Return to see the doctor in 6 months  Labs every 3 months Please call the clinic if you have any questions or concerns    Thank you for choosing [place] at [facility] to provide your oncology and hematology care.  To afford each patient quality time with our provider, please arrive at least 15 minutes before your scheduled appointment time.    You need to re-schedule your appointment should you arrive 10 or more minutes late.  We strive to give you quality time with our providers, and arriving late affects you and other patients whose appointments are after yours.  Also, if you no show three or more times for appointments you may be dismissed from the clinic at the providers discretion.     Again, thank you for choosing [facility].  Our hope is that these requests will decrease the amount of time that you wait before being seen by our physicians.       _____________________________________________________________  Should you have questions after your visit to [facility], please contact our office at [phone] between the hours of 8:30 a.m. and 4:30 p.m.  Voicemails left after 4:30 p.m. will not be returned until the following business day.  For prescription refill requests, have your pharmacy contact our office.

## 2015-08-15 NOTE — Progress Notes (Signed)
Richard [name] presented for labwork. Labs per MD order drawn via Peripheral Line 23 gauge needle inserted in right AC  Good blood return present. Procedure without incident.  Needle removed intact. Patient tolerated procedure well.

## 2015-08-16 ENCOUNTER — Telehealth (HOSPITAL_COMMUNITY): Payer: Self-pay | Admitting: Emergency Medicine

## 2015-08-16 ENCOUNTER — Other Ambulatory Visit (HOSPITAL_COMMUNITY): Payer: Self-pay | Admitting: Oncology

## 2015-08-16 DIAGNOSIS — D509 Iron deficiency anemia, unspecified: Secondary | ICD-10-CM

## 2015-08-16 LAB — IRON AND TIBC
IRON: 64 ug/dL (ref 45–182)
Saturation Ratios: 18 % (ref 17.9–39.5)
TIBC: 363 ug/dL (ref 250–450)
UIBC: 299 ug/dL

## 2015-08-16 LAB — FERRITIN: Ferritin: 39 ng/mL (ref 24–336)

## 2015-08-16 NOTE — Telephone Encounter (Signed)
-----   Message from Baird Cancer, PA-C sent at 08/16/2015  4:04 PM EST ----- Set-up for 1 dose of Feraheme.  Order is signed and held.

## 2015-08-16 NOTE — Telephone Encounter (Signed)
Pt notified of lab work, appt made for 08/24/2015 @ 2:00 pm

## 2015-08-24 ENCOUNTER — Encounter (HOSPITAL_BASED_OUTPATIENT_CLINIC_OR_DEPARTMENT_OTHER): Payer: Medicare Other

## 2015-08-24 VITALS — BP 147/105 | HR 71 | Temp 97.6°F | Resp 17

## 2015-08-24 DIAGNOSIS — D509 Iron deficiency anemia, unspecified: Secondary | ICD-10-CM | POA: Diagnosis not present

## 2015-08-24 MED ORDER — SODIUM CHLORIDE 0.9 % IV SOLN
510.0000 mg | Freq: Once | INTRAVENOUS | Status: AC
  Administered 2015-08-24: 510 mg via INTRAVENOUS
  Filled 2015-08-24: qty 17

## 2015-08-24 MED ORDER — SODIUM CHLORIDE 0.9 % IJ SOLN
10.0000 mL | Freq: Once | INTRAMUSCULAR | Status: AC
  Administered 2015-08-24: 10 mL via INTRAVENOUS

## 2015-08-24 MED ORDER — SODIUM CHLORIDE 0.9 % IV SOLN
Freq: Once | INTRAVENOUS | Status: AC
  Administered 2015-08-24: 14:00:00 via INTRAVENOUS

## 2015-08-24 NOTE — Patient Instructions (Signed)
[facility] at [facility] Discharge Instructions  RECOMMENDATIONS MADE BY THE CONSULTANT AND ANY TEST RESULTS WILL BE SENT TO YOUR REFERRING PHYSICIAN.  Today you received Feraheme 510 mg iron infusion. Return as scheduled.  Thank you for choosing [facility] at [facility] to provide your oncology and hematology care.  To afford each patient quality time with our provider, please arrive at least 15 minutes before your scheduled appointment time.    You need to re-schedule your appointment should you arrive 10 or more minutes late.  We strive to give you quality time with our providers, and arriving late affects you and other patients whose appointments are after yours.  Also, if you no show three or more times for appointments you may be dismissed from the clinic at the providers discretion.     Again, thank you for choosing [facility].  Our hope is that these requests will decrease the amount of time that you wait before being seen by our physicians.       _____________________________________________________________  Should you have questions after your visit to [facility], please contact our office at [phone] between the hours of 8:30 a.m. and 4:30 p.m.  Voicemails left after 4:30 p.m. will not be returned until the following business day.  For prescription refill requests, have your pharmacy contact our office.    

## 2015-08-24 NOTE — Progress Notes (Signed)
Tolerated iron infusion well. Blood pressure elevated at completion of watch period and rechecked by Gerald Leitz. She reports patient denied any complaints. She discontinued IV and reports patient was ambulatory on discharge home to self.

## 2015-08-30 ENCOUNTER — Ambulatory Visit: Payer: Medicare Other | Admitting: Internal Medicine

## 2015-09-06 DIAGNOSIS — E538 Deficiency of other specified B group vitamins: Secondary | ICD-10-CM | POA: Diagnosis not present

## 2015-09-21 ENCOUNTER — Emergency Department (HOSPITAL_COMMUNITY)
Admission: EM | Admit: 2015-09-21 | Discharge: 2015-09-21 | Disposition: A | Discharge status: Home / Self Care | Payer: Medicare Other | Attending: Emergency Medicine | Admitting: Emergency Medicine

## 2015-09-21 ENCOUNTER — Emergency Department (HOSPITAL_COMMUNITY): Payer: Medicare Other

## 2015-09-21 ENCOUNTER — Encounter (HOSPITAL_COMMUNITY): Payer: Self-pay

## 2015-09-21 DIAGNOSIS — S7001XA Contusion of right hip, initial encounter: Secondary | ICD-10-CM

## 2015-09-21 DIAGNOSIS — S59912A Unspecified injury of left forearm, initial encounter: Secondary | ICD-10-CM | POA: Insufficient documentation

## 2015-09-21 DIAGNOSIS — W010XXA Fall on same level from slipping, tripping and stumbling without subsequent striking against object, initial encounter: Secondary | ICD-10-CM | POA: Insufficient documentation

## 2015-09-21 DIAGNOSIS — S51802A Unspecified open wound of left forearm, initial encounter: Secondary | ICD-10-CM | POA: Insufficient documentation

## 2015-09-21 DIAGNOSIS — Z87442 Personal history of urinary calculi: Secondary | ICD-10-CM | POA: Diagnosis not present

## 2015-09-21 DIAGNOSIS — K219 Gastro-esophageal reflux disease without esophagitis: Secondary | ICD-10-CM | POA: Diagnosis not present

## 2015-09-21 DIAGNOSIS — E119 Type 2 diabetes mellitus without complications: Secondary | ICD-10-CM | POA: Diagnosis not present

## 2015-09-21 DIAGNOSIS — Y9389 Activity, other specified: Secondary | ICD-10-CM | POA: Diagnosis not present

## 2015-09-21 DIAGNOSIS — Z23 Encounter for immunization: Secondary | ICD-10-CM | POA: Diagnosis not present

## 2015-09-21 DIAGNOSIS — Y998 Other external cause status: Secondary | ICD-10-CM | POA: Insufficient documentation

## 2015-09-21 DIAGNOSIS — D519 Vitamin B12 deficiency anemia, unspecified: Secondary | ICD-10-CM | POA: Diagnosis not present

## 2015-09-21 DIAGNOSIS — Z8709 Personal history of other diseases of the respiratory system: Secondary | ICD-10-CM | POA: Diagnosis not present

## 2015-09-21 DIAGNOSIS — Z8619 Personal history of other infectious and parasitic diseases: Secondary | ICD-10-CM | POA: Insufficient documentation

## 2015-09-21 DIAGNOSIS — Z7951 Long term (current) use of inhaled steroids: Secondary | ICD-10-CM | POA: Diagnosis not present

## 2015-09-21 DIAGNOSIS — M1990 Unspecified osteoarthritis, unspecified site: Secondary | ICD-10-CM | POA: Insufficient documentation

## 2015-09-21 DIAGNOSIS — F419 Anxiety disorder, unspecified: Secondary | ICD-10-CM | POA: Insufficient documentation

## 2015-09-21 DIAGNOSIS — I10 Essential (primary) hypertension: Secondary | ICD-10-CM | POA: Diagnosis not present

## 2015-09-21 DIAGNOSIS — S51812A Laceration without foreign body of left forearm, initial encounter: Secondary | ICD-10-CM

## 2015-09-21 DIAGNOSIS — S7011XA Contusion of right thigh, initial encounter: Secondary | ICD-10-CM | POA: Diagnosis not present

## 2015-09-21 DIAGNOSIS — Z7984 Long term (current) use of oral hypoglycemic drugs: Secondary | ICD-10-CM | POA: Insufficient documentation

## 2015-09-21 DIAGNOSIS — Z79899 Other long term (current) drug therapy: Secondary | ICD-10-CM | POA: Insufficient documentation

## 2015-09-21 DIAGNOSIS — Z7901 Long term (current) use of anticoagulants: Secondary | ICD-10-CM | POA: Diagnosis not present

## 2015-09-21 DIAGNOSIS — Z9889 Other specified postprocedural states: Secondary | ICD-10-CM | POA: Insufficient documentation

## 2015-09-21 DIAGNOSIS — M25551 Pain in right hip: Secondary | ICD-10-CM

## 2015-09-21 DIAGNOSIS — Y92009 Unspecified place in unspecified non-institutional (private) residence as the place of occurrence of the external cause: Secondary | ICD-10-CM | POA: Diagnosis not present

## 2015-09-21 DIAGNOSIS — G4733 Obstructive sleep apnea (adult) (pediatric): Secondary | ICD-10-CM | POA: Diagnosis not present

## 2015-09-21 DIAGNOSIS — Z85828 Personal history of other malignant neoplasm of skin: Secondary | ICD-10-CM | POA: Insufficient documentation

## 2015-09-21 DIAGNOSIS — Z87891 Personal history of nicotine dependence: Secondary | ICD-10-CM | POA: Diagnosis not present

## 2015-09-21 DIAGNOSIS — S79921A Unspecified injury of right thigh, initial encounter: Secondary | ICD-10-CM | POA: Diagnosis not present

## 2015-09-21 DIAGNOSIS — I4891 Unspecified atrial fibrillation: Secondary | ICD-10-CM | POA: Insufficient documentation

## 2015-09-21 DIAGNOSIS — S79911A Unspecified injury of right hip, initial encounter: Secondary | ICD-10-CM | POA: Diagnosis not present

## 2015-09-21 DIAGNOSIS — W19XXXA Unspecified fall, initial encounter: Secondary | ICD-10-CM

## 2015-09-21 LAB — CBG MONITORING, ED: GLUCOSE-CAPILLARY: 106 mg/dL — AB (ref 65–99)

## 2015-09-21 MED ORDER — ACETAMINOPHEN 325 MG PO TABS
650.0000 mg | ORAL_TABLET | Freq: Once | ORAL | Status: DC
Start: 2015-09-21 — End: 2015-09-21
  Filled 2015-09-21: qty 2

## 2015-09-21 MED ORDER — TETANUS-DIPHTH-ACELL PERTUSSIS 5-2.5-18.5 LF-MCG/0.5 IM SUSP
0.5000 mL | Freq: Once | INTRAMUSCULAR | Status: AC
  Administered 2015-09-21: 0.5 mL via INTRAMUSCULAR
  Filled 2015-09-21: qty 0.5

## 2015-09-21 NOTE — ED Notes (Signed)
Ambulated Pt, Pt did fine walking with walker

## 2015-09-21 NOTE — Discharge Instructions (Signed)
°Emergency Department Resource Guide °1) Find a Doctor and Pay Out of Pocket °Although you won't have to find out who is covered by your insurance plan, it is a good idea to ask around and get recommendations. You will then need to call the office and see if the doctor you have chosen will accept you as a new patient and what types of options they offer for patients who are self-pay. Some doctors offer discounts or will set up payment plans for their patients who do not have insurance, but you will need to ask so you aren't surprised when you get to your appointment. ° °2) Contact Your Local Health Department °Not all health departments have doctors that can see patients for sick visits, but many do, so it is worth a call to see if yours does. If you don't know where your local health department is, you can check in your phone book. The CDC also has a tool to help you locate your state's health department, and many state websites also have listings of all of their local health departments. ° °3) Find a Walk-in Clinic °If your illness is not likely to be very severe or complicated, you may want to try a walk in clinic. These are popping up all over the country in pharmacies, drugstores, and shopping centers. They're usually staffed by nurse practitioners or physician assistants that have been trained to treat common illnesses and complaints. They're usually fairly quick and inexpensive. However, if you have serious medical issues or chronic medical problems, these are probably not your best option. ° °No Primary Care Doctor: °- Call [facility] at  832-8000 - they can help you locate a primary care doctor that  accepts your insurance, provides certain services, etc. °- Physician Referral Service- 1-800-533-3463 ° °Chronic Pain Problems: °Organization         Address  Phone   Notes  °[facility]  (336) 297-2271 Patients need to be referred by their primary care doctor.  ° °Medication  Assistance: °Organization         Address  Phone   Notes  °[place] [facility] [address] °[place], [place] [address] (336) 641-8030 --Must be a resident of [place] °-- Must have NO insurance coverage whatsoever (no Medicaid/ Medicare, etc.) °-- The pt. MUST have a primary care doctor that directs their care regularly and follows them in the community °  °[facility]  (866) 331-1348   °[employer]  (888) 892-1162   ° °Agencies that provide inexpensive medical care: °Organization         Address  Phone   Notes  °[facility]  (336) 832-8035   °[facility]    (336) 832-7272   °[facility] [address] °[place], [place] [address] (336) 832-4777   °[facility] [address], °[place] (336) 271-4999   °[facility]    (336) 373-0678   °[facility]    (336) 272-1050   °[facility] ° [address], [place] Phone:  (336) 832-4444, Fax:  (336) 832-4440 Hours of Operation:  9 am - 6 pm, M-F.  Also accepts Medicaid/Medicare and self-pay.  °[facility] ° [address], [place] Phone: (336) 832-3150, Fax: (336) [phone]. Hours of Operation:  8:30 am - 5:30 pm, M-F.  Also accepts Medicaid and self-pay.  °[facility] [place] [address], [place] Phone: (336) 878-6027   °[facility] [address], [place], [place] (336)723-1848, Ext. 123 Mondays & Thursdays: 7-9 AM.  First 15 patients are seen on a first come, first serve basis. °  ° °Medicaid-accepting [place] Providers: ° °Organization         Address  Phone   Notes  °[facility] [address], [place] (336) 641-2100 Also accepts self-pay patients.  °[facility] [address], [place] ° (336) 856-9996   °[facility] [address], [place]  (336) 288-8857   °[facility] [address] [place] [address], [place] (336) 299-7000   °Veita Bland [address], [place]  ° (336) 373-1557 Only accepts [place] Access Medicaid patients after they have their name applied to their card.  ° °Self-Pay (no insurance) in [place]: ° °Organization         Address  Phone   Notes  °Sickle Cell Patients, [facility] [address], [place] (336) 832-1970   °[facility] [address], [place] (336) 832-4400   °[facility] ° [address], [place] (336) 992-4800   °[facility]/Dr. Osei-Bonsu ° [address] [place] [address], [place] or [address], [place] (336) [phone] Phone number for both [place] and [place] locations is the same.  °[facility] [address], [place] (336) 299-0000   °[facility] [address] [place] [address], [place] or [address] (336) 852-7530 °(336) 878-2260   °[facility] [address], [place] (336) 350-1642, phone; (336) 294-5005, fax Sees patients 1st and 3rd Saturday of every month.  Must not qualify for public or private insurance (i.e. Medicaid, Medicare, [place] Health Choice, Veterans' Benefits) • Household income should be no more than 200% of the poverty level •The clinic cannot treat you if you are pregnant or think you are pregnant • Sexually transmitted diseases are not treated at the clinic.  ° ° °Dental Care: °Organization         Address  Phone  Notes  °[place] [facility] [address], [place] (336) 641-6152 Accepts children up to age 21 who are enrolled in Medicaid or [place] Health Choice; pregnant women with a Medicaid card; and children who have applied for Medicaid or [place] Health Choice, but were declined, whose parents can pay a reduced fee at time of service.  °[place]  [facility] [place]  [address], [place] (336) 641-7733 Accepts children up to age 21 who are enrolled in Medicaid or [place] Health Choice; pregnant women with a Medicaid card; and children who have applied for Medicaid or [place] Health Choice, but were declined, whose parents can pay a reduced fee at time of service.  °[facility] ° [address], [place] (336) 641-4533 Patients are seen by appointment only. Walk-ins are not accepted. [facility] will see patients 18 years of age and older. °Monday - Tuesday (8am-5pm) °Most Wednesdays (8:30-5pm) °$30 per visit, cash only  °[facility] ° [address], [place] (336) 641-4533 Patients are seen by appointment only. Walk-ins are not accepted. [facility] will see patients 18 years of age and older. °One   Wednesday Evening (Monthly: Volunteer Based).  $30 per visit, cash only  °[facility]  (919) 537-3737 for adults; Children under age 4, call Graduate Pediatric Dentistry at (919) [phone]. Children aged 4-14, please call ([phone]) [phone] to request a pediatric application. ° Dental services are provided in all areas of dental care including fillings, crowns and bridges, complete and partial dentures, implants, gum treatment, root canals, and extractions. Preventive care is also provided. Treatment is provided to both adults and children. °Patients are selected via a lottery and there is often a waiting list. °  °[facility] [address], °[place] ° (336) 763-8833 www.drcivils.com °  °[facility] [address], [place], [place] (336)723-1848, Ext. 123 Second and Fourth Thursday of each month, opens at 6:30 AM; Clinic ends at 9 AM.  Patients are seen on a first-come first-served basis, and a limited number are seen during each clinic.  ° °[facility] ° [address], [place], [place] (336) 723-7904    Eligibility Requirements °You must have lived in [place], [place], or [place] counties for at least the last three months. °  You cannot be eligible for state or federal sponsored healthcare insurance, including Veterans Administration, Medicaid, or Medicare. °  You generally cannot be eligible for healthcare insurance through your employer.  °  How to apply: °Eligibility screenings are held every Tuesday and Wednesday afternoon from 1:00 pm until 4:00 pm. You do not need an appointment for the interview!  °[facility] [address], [place], [place] 336-631-2330   °[facility]  336-342-8273   °[facility]  336-703-3100   °[facility]  336-570-6415   ° °Behavioral Health Resources in the Community: °Intensive Outpatient Programs °Organization         Address  Phone  Notes  °[facility] [address], [place], [place] 336-878-6098   °[facility] [address], [place], [place] 336-832-9800   °[facility] [address], [place], [place] ° 336-882-2125   °[facility] [address],  °[place], [place] 1-800-853-5163 or 336-641-4981   °Substance Abuse Resources °Organization         Address  Phone  Notes  °[facility]  336-882-2125   °[facility]  336-784-9470   °[facility]  336-285-9073   °[facility]  336-845-3988   °Residential & Outpatient Substance Abuse Program  1-800-659-3381   °Psychological Services °Organization         Address  Phone  Notes  °[facility]  336- 832-9600   °[facility]  336- 378-7881   °[facility] [address], [place] 1-800-853-5163 or 336-641-4981   ° °Mobile Crisis Teams °Organization         Address  Phone  Notes  °[facility], Mobile Crisis Care Unit  1-877-626-1772   °Assertive °[facility] ° [address]  [place], [place] 336-834-9664   °Sharon DeEsch [address] °[place]  336-554-5454   ° °Self-Help/Support Groups °Organization         Address  Phone             Notes  °[facility] - variety of support groups  336- 373-1402 Call for more information  °Narcotics Anonymous (NA), [facility] [address], °[place]   2 meetings at this location  ° °  Residential Treatment Programs Organization         Address  Phone  Notes  [facility] [address]    [place]  1-(440) 112-3816   [facility]  [address], [place] [id number], [place], [place]   [place], [place] (660) [phone] Admissions: 8am-3pm M-F  [facility] [place] [address] [place] [address],    [place], [place] [id number]   [facility] [address] [place], [place], [place]   [facility] [address],  [place], [place]   [facility] - Intensive Outpatient [address], [place], [place]   [facility] ([place]  [place], [place] 1-406 719 5279 or 214-214-0911   [facility] [address], [place], [place] Accepts Medicaid  [facility] [address] [place] [address]  [place] 1-7373026204 Substance Abuse/Addiction Treatment   [facility]         Address  Phone  Notes  [facility]  786-836-7254   Domenic Schwab, PhD [address] Arlis Porta [place], [place]   807-307-7788 or 785-107-4622   [facility] [place], [place] 218-180-7019   [facility] [address] [place], [place] 319-331-8672 Insurance/Medicaid/sponsorship through [facility] [address]                                    [place], [place] [phone] [place] [address], [place] [phone]    Dr. Adele Schilder  [phone]   [facility] Dept. 1) [address], [place] 2) [place] 3)  [place] [address], [place] [phone] [phone]  [phone]   [place] [phone] or [phone] (After Hours)      Take over the counter tylenol, as directed on packaging, as needed for discomfort. Continue to walk with your cane.  Apply moist heat or ice to the area(s) of discomfort, for 15 minutes at a time, several times per day for the next few days.  Do not fall asleep on a heating or ice pack.  Call your regular medical doctor on Monday to schedule a follow up appointment in the next 2 to 3 days.  Return to the Emergency Department immediately if worsening.

## 2015-09-21 NOTE — ED Provider Notes (Signed)
CSN: [id number]     Arrival date & time 09/21/15  1137 History   First MD Initiated Contact with Patient 09/21/15 1314     Chief Complaint  Patient presents with  . Fall      HPI Pt was seen at 36. Per pt and his wife, c/o sudden onset and resolution of one episode of slip and fall 3 days ago. Pt states he fell onto his right hip. Pt c/o continued right hip pain since the fall and skin tear left forearm. Pt denies syncope/LOC, no AMS, no neck or back pain, no prodromal symptoms before fall, no CP/SOB, no abd pain, no N/V/D, no focal motor weakness, no tingling/numbness in extremities.    Past Medical History  Diagnosis Date  . Hypertension   . Anxiety   . Hiatal hernia   . Skin cancer of lip   . Sinus drainage   . Type II diabetes mellitus ([place])   . GERD (gastroesophageal reflux disease)   . Arthritis   . Nephrolithiasis   . On home oxygen therapy     a. 2L w/CPAP at night  . [place] spotted fever ~ 1945  . LBBB (left bundle branch block)   . Hyperlipidemia   . Atrial fibrillation ([place])     a. Dx 03/2013, notes report atrial fibrillation/atrial flutter, placed on amiodarone. NSR in subsequent OV's.  . Chest pain     a. H/o CTA negative for PE 2012, normal cath 2005, normal nucs previously including 05/2012.  . Orthostatic hypotension   . Complete heart block ([facility])     a. s/p Medtronic Adapta L model ADDRL 1 (serial number [id number]) pacemaker.  . OSA on CPAP     cpap  . Microcytic anemia   . B12 deficiency anemia   . Iron deficiency anemia    Past Surgical History  Procedure Laterality Date  . Vasectomy      Hx of   . Lumbar disc surgery  ~ 1993  . Inguinal hernia repair Right   . Cardiac catheterization      by Dr. Acie Fredrickson, January 24, 2004, that shows minimal coronary artery irregularities and normal left ventricular function  . Cataract extraction w/ intraocular lens  implant, bilateral Bilateral   . Skin cancer excision      "lower lip" (04/06/2013)  .  Temporary pacemaker insertion N/A 05/31/2014    Procedure: TEMPORARY WIRE;  Surgeon: Sinclair Grooms, MD;  [facility];  Service: Cardiovascular;  Laterality: N/A;  . Permanent pacemaker insertion N/A 06/03/2014    MDT Adapta L implanted by Dr Rayann Heman for syncope and transient AV block  . Esophagogastroduodenoscopy (egd) with propofol N/A 06/21/2015    Procedure: ESOPHAGOGASTRODUODENOSCOPY (EGD) WITH PROPOFOL;  Surgeon: Gatha Mayer, MD;  Location: [facility] ENDOSCOPY;  Service: Endoscopy;  Laterality: N/A;   Family History  Problem Relation Age of Onset  . Other      Parents both died of old age; other conditions not known  . Stroke Brother   . Breast cancer Sister   . Pneumonia Father   . Thyroid disease Mother     goiter   Social History  Substance Use Topics  . Smoking status: Former Smoker -- 0.00 packs/day for 20 years    Types: Cigarettes  . Smokeless tobacco: Never Used     Comment: 04/06/2013 "quit smoking years ago; didn't smoke that much; probably 20 years; 1ppd"  . Alcohol Use: No    Review of  Systems ROS: Statement: All systems negative except as marked or noted in the HPI; Constitutional: Negative for fever and chills. ; ; Eyes: Negative for eye pain, redness and discharge. ; ; ENMT: Negative for ear pain, hoarseness, nasal congestion, sinus pressure and sore throat. ; ; Cardiovascular: Negative for chest pain, palpitations, diaphoresis, dyspnea and peripheral edema. ; ; Respiratory: Negative for cough, wheezing and stridor. ; ; Gastrointestinal: Negative for nausea, vomiting, diarrhea, abdominal pain, blood in stool, hematemesis, jaundice and rectal bleeding. . ; ; Genitourinary: Negative for dysuria, flank pain and hematuria. ; ; Musculoskeletal: Negative for back pain and neck pain. +right hip pain..; ; Skin: +abrasion. Negative for pruritus, rash, blisters, and skin lesion.; ; Neuro: Negative for headache, lightheadedness and neck stiffness. Negative for weakness,  altered level of consciousness , altered mental status, extremity weakness, paresthesias, involuntary movement, seizure and syncope.     Allergies  Codeine and Citalopram  Home Medications   Prior to Admission medications   Medication Sig Start Date End Date Taking? Authorizing Provider  acetaminophen (TYLENOL) 500 MG tablet Take 500 mg by mouth every 6 (six) hours as needed for mild pain.    Historical Provider, MD  amiodarone (PACERONE) 200 MG tablet Take 1 tablet (200 mg) on Monday, Wednesday, Friday only Patient taking differently: Take 200 mg by mouth every Monday, Wednesday, and Friday. Take 1 tablet (200 mg) by mouth on Monday, Wednesday, Friday onl 01/11/14   Thayer Headings, MD  amLODipine (NORVASC) 5 MG tablet TAKE 1 TABLET (5 MG TOTAL) BY MOUTH DAILY. 01/18/15   Thayer Headings, MD  benazepril (LOTENSIN) 40 MG tablet Take 40 mg by mouth daily.      Historical Provider, MD  Cyanocobalamin (VITAMIN B-12 IJ) Inject as directed every 30 (thirty) days.     Historical Provider, MD  diazepam (VALIUM) 5 MG tablet Take 5 mg by mouth at bedtime.      Historical Provider, MD  fluticasone (FLONASE) 50 MCG/ACT nasal spray Place 1 spray into the nose daily.      Historical Provider, MD  metFORMIN (GLUCOPHAGE) 1000 MG tablet Take 1,000 mg by mouth 2 (two) times daily with a meal.    Historical Provider, MD  metoprolol (LOPRESSOR) 50 MG tablet TAKE 1 TABLET BY MOUTH TWICE A DAY 07/06/15   Thayer Headings, MD  NON FORMULARY CPAP @@ night with Oxygen    Historical Provider, MD  omeprazole (PRILOSEC) 20 MG capsule Take 20 mg by mouth daily.     Historical Provider, MD  pioglitazone (ACTOS) 30 MG tablet Take 30 mg by mouth daily.      Historical Provider, MD  sertraline (ZOLOFT) 100 MG tablet Take 1 tablet by mouth daily. 08/21/14   Historical Provider, MD  XARELTO 20 MG TABS tablet TAKE 1 TABLET (20 MG TOTAL) BY MOUTH DAILY WITH SUPPER. 07/23/15   Thayer Headings, MD   BP 163/80 mmHg  Pulse 68   Temp(Src) 98 F (36.7 C) (Oral)  Resp 14  Ht 6\' 3"  (1.905 m)  Wt 251 lb (113.853 kg)  BMI 31.37 kg/m2  SpO2 90% Physical Exam  1325: Physical examination:  Nursing notes reviewed; Vital signs and O2 SAT reviewed;  Constitutional: Well developed, Well nourished, Well hydrated, In no acute distress; Head:  Normocephalic, atraumatic; Eyes: EOMI, PERRL, No scleral icterus; ENMT: Mouth and pharynx normal, Mucous membranes moist; Neck: Supple, Full range of motion, No lymphadenopathy; Cardiovascular: Regular rate and rhythm, No gallop; Respiratory: Breath sounds clear &  equal bilaterally, No wheezes.  Speaking full sentences with ease, Normal respiratory effort/excursion; Chest: Nontender, Movement normal; Abdomen: Soft, Nontender, Nondistended, Normal bowel sounds; Genitourinary: No CVA tenderness; Spine:  No midline CS, TS, LS tenderness.;; Extremities: Pulses normal, Pelvis stable. +right hip tenderness to palp with localized edema and fading ecchymosis. NT right knee/ankle/foot. No calf edema or asymmetry. +superficial skin tear left forearm. NT left shoulder/elbow/wrist/hand.; Neuro: AA&Ox3, +HOH, otherwise major CN grossly intact.  Speech clear. Decreased ROM right hip due to pain, otherwise no gross focal motor or sensory deficits in extremities.; Skin: Color normal, Warm, Dry.    ED Course  Procedures (including critical care time) Labs Review  Imaging Review  I have personally reviewed and evaluated these images and lab results as part of my medical decision-making.   EKG Interpretation None      MDM  MDM Reviewed: previous chart, nursing note and vitals Interpretation: x-ray and labs     Results for orders placed or performed during the hospital encounter of 09/21/15  CBG monitoring, ED  Result Value Ref Range   Glucose-Capillary 106 (H) 65 - 99 mg/dL   Dg Pelvis 1-2 Views 09/21/2015  CLINICAL DATA:  Status post fall 09/17/2015 mother right hip injury. Pain. Initial  encounter. EXAM: PELVIS - 1-2 VIEW COMPARISON:  None. FINDINGS: No acute bony or joint abnormality is identified. Mild degenerative change is seen about the hips and lower lumbar spine. No focal bony lesion. Atherosclerosis noted. IMPRESSION: No acute abnormality. Electronically Signed   By: Inge Rise M.D.   On: 09/21/2015 15:09   Dg Forearm Left 09/21/2015  CLINICAL DATA:  Status post fall 09/18/2015 with a left forearm injury. Initial encounter. EXAM: LEFT FOREARM - 2 VIEW COMPARISON:  None. FINDINGS: No acute bony or joint abnormality is identified. Mild spurring at the triceps tendon insertion on the olecranon of the ulna is noted. There is some first CMC degenerative change. No radiopaque foreign body is seen. IMPRESSION: No acute abnormality. Electronically Signed   By: Inge Rise M.D.   On: 09/21/2015 15:06   Dg Femur, Min 2 Views Right 09/21/2015  CLINICAL DATA:  Fall.  Initial evaluation. EXAM: RIGHT FEMUR 2 VIEWS COMPARISON:  None. FINDINGS: Degenerative changes right hip and knee. Ossification in the proximal patellar tendon is fragmented, this may be old. No other focal significant bony abnormality identified. No evidence of femoral fracture. IMPRESSION: 1. Degenerative changes right hip and knee. Fragmentation noted in patellar tendon ossification/osteophyte, this may be old. No evidence of acute femoral abnormality. No evidence of femoral fracture. 2. Peripheral vascular disease. Electronically Signed   By: Marcello Moores  Register   On: 09/21/2015 15:10   Ct Hip Right Wo Contrast 09/21/2015  CLINICAL DATA:  Status post fall.  Unable to bear weight. EXAM: CT OF THE RIGHT HIP WITHOUT CONTRAST TECHNIQUE: Multidetector CT imaging of the right hip was performed according to the standard protocol. Multiplanar CT image reconstructions were also generated. COMPARISON:  None. FINDINGS: There is no acute fracture or dislocation. There is no lytic or sclerotic osseous lesion. There is mild  osteoarthritis of the right hip. There is prominent lateral osteophyte along the superior acetabulum. There is no bone destruction or periosteal reaction. The muscles are normal. There is no fluid collection or hematoma. There is peripheral vascular atherosclerotic disease. IMPRESSION: 1. No acute osseous injury of the right hip. 2. Mild osteoarthritis of the right hip. Electronically Signed   By: Kathreen Devoid   On: 09/21/2015 16:37  1715:  No fx on XR or CT. Pt ambulated with his cane with his baseline gait, per his wife watching him walk. Pt's wife would like to take him home now and pt wants to go home now. Dx and testing d/w pt and family.  Questions answered.  Verb understanding, agreeable to d/c home with outpt f/u.     Francine Graven, DO 09/24/15 2116

## 2015-09-21 NOTE — ED Notes (Signed)
Pt reports went out to feed his cat Tuesday and "feet got tangled up" and he fell.  Pt has large skin tear to top of left forearm and c/o pain to r thigh.  Pt says is having to walk with a cane since the fall.

## 2015-09-24 ENCOUNTER — Encounter: Payer: Medicare Other | Admitting: Internal Medicine

## 2015-09-24 ENCOUNTER — Telehealth: Payer: Self-pay | Admitting: Internal Medicine

## 2015-09-24 NOTE — Telephone Encounter (Signed)
Returned call to patient and spoke with his wife.  She just wants to move up his appointment from March to sometime in April.  She says she called Fri after he had fallen to cancel the appointment but was told to wait until today to see if he could make it.  He is very sore from the fall.  She says she will discuss his medications with the MD at his OV

## 2015-09-24 NOTE — Telephone Encounter (Signed)
New Message  Pt wife called request a call back to discuss if the pt should come off of the Xarelto. Pt wife states that he fell and his skin peeled back. Pt went to the ER where he received a tetanins shot. Request a call back to discuss if he should stop taking the xarelto for now and being that he gets bruised badly, should he discontinue taking Xarelto all together. Please call back to discuss.

## 2015-09-24 NOTE — Telephone Encounter (Signed)
Per pt's wife call would like and earlier appointment please.   Give them a call to have his pacer checked.

## 2015-10-01 ENCOUNTER — Encounter: Payer: Self-pay | Admitting: Internal Medicine

## 2015-10-01 ENCOUNTER — Ambulatory Visit (INDEPENDENT_AMBULATORY_CARE_PROVIDER_SITE_OTHER): Payer: Medicare Other | Admitting: Internal Medicine

## 2015-10-01 VITALS — BP 120/60 | HR 73 | Ht 75.5 in | Wt 254.8 lb

## 2015-10-01 DIAGNOSIS — I442 Atrioventricular block, complete: Secondary | ICD-10-CM

## 2015-10-01 DIAGNOSIS — I480 Paroxysmal atrial fibrillation: Secondary | ICD-10-CM

## 2015-10-01 DIAGNOSIS — I119 Hypertensive heart disease without heart failure: Secondary | ICD-10-CM

## 2015-10-01 LAB — CUP PACEART INCLINIC DEVICE CHECK
Battery Impedance: 100 Ohm
Battery Remaining Longevity: 158 mo
Brady Statistic AP VP Percent: 1 %
Brady Statistic AS VP Percent: 6 %
Brady Statistic AS VS Percent: 72 %
Date Time Interrogation Session: 20170206090719
Implantable Lead Implant Date: 20151010
Implantable Lead Location: 753859
Implantable Lead Model: 5076
Implantable Lead Model: 5092
Lead Channel Impedance Value: 765 Ohm
Lead Channel Pacing Threshold Amplitude: 0.5 V
Lead Channel Pacing Threshold Pulse Width: 0.4 ms
Lead Channel Pacing Threshold Pulse Width: 0.4 ms
Lead Channel Pacing Threshold Pulse Width: 0.4 ms
Lead Channel Sensing Intrinsic Amplitude: 22.4 mV
Lead Channel Setting Pacing Amplitude: 2 V
Lead Channel Setting Pacing Amplitude: 2.5 V
Lead Channel Setting Sensing Sensitivity: 5.6 mV
MDC IDC LEAD IMPLANT DT: 20151010
MDC IDC LEAD LOCATION: 753860
MDC IDC MSMT BATTERY VOLTAGE: 2.8 V
MDC IDC MSMT LEADCHNL RA IMPEDANCE VALUE: 463 Ohm
MDC IDC MSMT LEADCHNL RA PACING THRESHOLD AMPLITUDE: 0.5 V
MDC IDC MSMT LEADCHNL RA SENSING INTR AMPL: 1.4 mV
MDC IDC MSMT LEADCHNL RV PACING THRESHOLD AMPLITUDE: 0.625 V
MDC IDC MSMT LEADCHNL RV PACING THRESHOLD AMPLITUDE: 0.75 V
MDC IDC MSMT LEADCHNL RV PACING THRESHOLD PULSEWIDTH: 0.4 ms
MDC IDC SET LEADCHNL RV PACING PULSEWIDTH: 0.4 ms
MDC IDC STAT BRADY AP VS PERCENT: 21 %

## 2015-10-01 NOTE — Progress Notes (Signed)
Electrophysiology Office Note   Date:  10/01/2015   ID:  Richard [name], DOB Jul 28, 1931, MRN [id number]  PCP:  Tivis Ringer, MD  Cardiologist:  Dr Dagmar Hait Primary Electrophysiologist:  Thompson Grayer, MD    Chief Complaint  Patient presents with  . Atrial Fibrillation     History of Present Illness: Richard [name] is a 82 y.o. male who presents today for electrophysiology evaluation.   He has done well since his last visit.  His anemia is "better".  He is following closely with Dr Dagmar Hait and hematology for this.  He is back on Xarelto without evidence of bleeding.   He recently stumbled and fell.  Was seen in the ER.  Today, he denies symptoms of palpitations, chest pain, shortness of breath, orthopnea, PND, lower extremity edema, claudication, dizziness, presyncope, syncope, bleeding, or neurologic sequela. The patient is tolerating medications without difficulties and is otherwise without complaint today.    Past Medical History  Diagnosis Date  . Hypertension   . Anxiety   . Hiatal hernia   . Skin cancer of lip   . Sinus drainage   . Type II diabetes mellitus ([place])   . GERD (gastroesophageal reflux disease)   . Arthritis   . Nephrolithiasis   . On home oxygen therapy     a. 2L w/CPAP at night  . Rocky Mountain spotted fever ~ 1945  . LBBB (left bundle branch block)   . Hyperlipidemia   . Atrial fibrillation ([place])     a. Dx 03/2013, notes report atrial fibrillation/atrial flutter, placed on amiodarone. NSR in subsequent OV's.  . Chest pain     a. H/o CTA negative for PE 2012, normal cath 2005, normal nucs previously including 05/2012.  . Orthostatic hypotension   . Complete heart block (HCC)     a. s/p Medtronic Adapta L model ADDRL 1 (serial number [id number]) pacemaker.  . OSA on CPAP     cpap  . Microcytic anemia   . B12 deficiency anemia   . Iron deficiency anemia    Past Surgical History  Procedure Laterality Date  . Vasectomy      Hx of   . Lumbar disc  surgery  ~ 1993  . Inguinal hernia repair Right   . Cardiac catheterization      by Dr. Acie Fredrickson, January 24, 2004, that shows minimal coronary artery irregularities and normal left ventricular function  . Cataract extraction w/ intraocular lens  implant, bilateral Bilateral   . Skin cancer excision      "lower lip" (04/06/2013)  . Temporary pacemaker insertion N/A 05/31/2014    Procedure: TEMPORARY WIRE;  Surgeon: Sinclair Grooms, MD;  Location[facility] C[facility] LAB;  Service: Cardiovascular;  Laterality: N/A;  . Permanent pacemaker insertion N/A 06/03/2014    MDT Adapta L implanted by Dr Rayann Heman for syncope and transient AV block  . Esophagogastroduodenoscopy (egd) with propofol N/A 06/21/2015    Procedure: ESOPHAGOGASTRODUODENOSCOPY (EGD) WITH PROPOFOL;  Surgeon: Gatha Mayer, MD;  Location: [facility] ENDOSCOPY;  Service: Endoscopy;  Laterality: N/A;     Current Outpatient Prescriptions  Medication Sig Dispense Refill  . acetaminophen (TYLENOL) 500 MG tablet Take 500 mg by mouth every 6 (six) hours as needed for mild pain.    Marland Kitchen amiodarone (PACERONE) 200 MG tablet Take 200 mg by mouth 3 (three) times a week. On Monday, Wednesday & Friday    . amLODipine (NORVASC) 5 MG tablet TAKE 1 TABLET (5 MG TOTAL) BY  MOUTH DAILY. 90 tablet 2  . benazepril (LOTENSIN) 40 MG tablet Take 40 mg by mouth daily.      . cyanocobalamin (,VITAMIN B-12,) 1000 MCG/ML injection Inject 1,000 mcg into the muscle every 30 (thirty) days.    . diazepam (VALIUM) 5 MG tablet Take 5 mg by mouth at bedtime.      . fluticasone (FLONASE) 50 MCG/ACT nasal spray Place 1 spray into the nose daily as needed for allergies or rhinitis.     . metFORMIN (GLUCOPHAGE) 1000 MG tablet Take 1,000 mg by mouth 2 (two) times daily with a meal.    . metoprolol (LOPRESSOR) 50 MG tablet TAKE 1 TABLET BY MOUTH TWICE A DAY 60 tablet 11  . omeprazole (PRILOSEC) 20 MG capsule Take 20 mg by mouth daily.     . OXYGEN Inhale into the lungs at bedtime. CPAP with OXYGEN     . pioglitazone (ACTOS) 30 MG tablet Take 30 mg by mouth daily.      . sertraline (ZOLOFT) 100 MG tablet Take 1 tablet by mouth daily.    Alveda Reasons 20 MG TABS tablet TAKE 1 TABLET (20 MG TOTAL) BY MOUTH DAILY WITH SUPPER. 30 tablet 11   No current facility-administered medications for this visit.    Allergies:   Codeine and Citalopram   Social History:  The patient  reports that he has quit smoking. His smoking use included Cigarettes. He smoked 0.00 packs per day for 20 years. He has never used smokeless tobacco. He reports that he does not drink alcohol or use illicit drugs.   Family History:  The patient's family hx includes HTN (mother)   ROS:  Please see the history of present illness.   All other systems are reviewed and negative.    PHYSICAL EXAM: VS:  BP 120/60 mmHg  Pulse 73  Ht 6' 3.5" (1.918 m)  Wt 254 lb 12.8 oz (115.577 kg)  BMI 31.42 kg/m2 , BMI Body mass index is 31.42 kg/(m^2). GEN: elderly, in no acute distress HEENT: normal Neck: no JVD, carotid bruits, or masses Cardiac: RRR  Respiratory:  clear to auscultation bilaterally, normal work of breathing GI: soft, nontender, nondistended, + BS MS: no deformity or atrophy Skin: warm and dry,  device pocket is well healed, multiple superifical skin cancers on head and ears Neuro:  Strength and sensation are intact Psych: euthymic mood, full affect  EKG:  EKG is ordered today. The ekg ordered today shows sinus rhythm with LBBB   Device interrogation is reviewed today in detail.  See PaceArt for details.   Recent Labs: 05/15/2015: ALT 21; BUN 21*; Creatinine, Ser 1.19; Potassium 4.5; Sodium 142 08/15/2015: Hemoglobin 10.7*; Platelets 164    Lipid Panel     Component Value Date/Time   CHOL 111 06/01/2014 0440   TRIG 159* 06/01/2014 0440   HDL 31* 06/01/2014 0440   CHOLHDL 3.6 06/01/2014 0440   VLDL 32 06/01/2014 0440   LDLCALC 48 06/01/2014 0440     Wt Readings from Last 3 Encounters:  10/01/15 254  lb 12.8 oz (115.577 kg)  09/21/15 251 lb (113.853 kg)  08/15/15 257 lb (116.574 kg)      Other studies Reviewed: Additional studies/ records that were reviewed today include: Dr Elmarie Shiley note    ASSESSMENT AND PLAN:  1.  Syncope, transient AV block resolved s/p PPM Normal pacemaker function See Pace Art report No changes today  2. Hypertensive cardiovascular disease Stable No change required today  3. Afib No  afib on device interrogation within the past 12 months on low dose amiodarone Dr Dagmar Hait to follow LFTs/TFTs chads2vasc score is 4.  He is on xarelto and anemia is followed by Dr Dagmar Hait. Given very low afib burden, may have to stop anticoagulation if he has additional bleeding/ anemia issues as risks may outweigh benefits   Current medicines are reviewed at length with the patient today.   The patient does not have concerns regarding his medicines.  The following changes were made today:  none   Disposition:   [facility] every 3 months, return to see EP NP in 1 year,  Follow-up with Dr Acie Fredrickson as scheduled.   Army Fossa, MD  10/01/2015 9:03 AM     [facility] [address] [place] [address] [phone] (office) [phone] (fax)

## 2015-10-01 NOTE — Patient Instructions (Signed)
Medication Instructions:  Your physician recommends that you continue on your current medications as directed. Please refer to the Current Medication list given to you today.   Labwork: None ordered   Testing/Procedures: None ordered   Follow-Up: Your physician wants you to follow-up in: 12 months with Chanetta Marshall, NP You will receive a reminder letter in the mail two months in advance. If you don't receive a letter, please call our office to schedule the follow-up appointment.  Remote monitoring is used to monitor your Pacemaker from home. This monitoring reduces the number of office visits required to check your device to one time per year. It allows [place] to keep an eye on the functioning of your device to ensure it is working properly. You are scheduled for a device check from home on 12/31/15. You may send your transmission at any time that day. If you have a wireless device, the transmission will be sent automatically. After your physician reviews your transmission, you will receive a postcard with your next transmission date.     Any Other Special Instructions Will Be Listed Below (If Applicable).     If you need a refill on your cardiac medications before your next appointment, please call your pharmacy.

## 2015-10-04 DIAGNOSIS — E538 Deficiency of other specified B group vitamins: Secondary | ICD-10-CM | POA: Diagnosis not present

## 2015-10-19 ENCOUNTER — Other Ambulatory Visit: Payer: Self-pay | Admitting: Cardiovascular Disease

## 2015-10-23 DIAGNOSIS — E1122 Type 2 diabetes mellitus with diabetic chronic kidney disease: Secondary | ICD-10-CM | POA: Diagnosis not present

## 2015-10-23 DIAGNOSIS — E538 Deficiency of other specified B group vitamins: Secondary | ICD-10-CM | POA: Diagnosis not present

## 2015-10-23 DIAGNOSIS — E038 Other specified hypothyroidism: Secondary | ICD-10-CM | POA: Diagnosis not present

## 2015-10-23 DIAGNOSIS — I10 Essential (primary) hypertension: Secondary | ICD-10-CM | POA: Diagnosis not present

## 2015-10-23 DIAGNOSIS — Z125 Encounter for screening for malignant neoplasm of prostate: Secondary | ICD-10-CM | POA: Diagnosis not present

## 2015-10-23 DIAGNOSIS — I442 Atrioventricular block, complete: Secondary | ICD-10-CM | POA: Diagnosis not present

## 2015-10-23 DIAGNOSIS — N183 Chronic kidney disease, stage 3 (moderate): Secondary | ICD-10-CM | POA: Diagnosis not present

## 2015-10-30 DIAGNOSIS — Z1389 Encounter for screening for other disorder: Secondary | ICD-10-CM | POA: Diagnosis not present

## 2015-10-30 DIAGNOSIS — D6489 Other specified anemias: Secondary | ICD-10-CM | POA: Diagnosis not present

## 2015-10-30 DIAGNOSIS — I10 Essential (primary) hypertension: Secondary | ICD-10-CM | POA: Diagnosis not present

## 2015-10-30 DIAGNOSIS — Z0000 Encounter for general adult medical examination without abnormal findings: Secondary | ICD-10-CM | POA: Diagnosis not present

## 2015-10-30 DIAGNOSIS — I442 Atrioventricular block, complete: Secondary | ICD-10-CM | POA: Diagnosis not present

## 2015-10-30 DIAGNOSIS — N183 Chronic kidney disease, stage 3 (moderate): Secondary | ICD-10-CM | POA: Diagnosis not present

## 2015-10-30 DIAGNOSIS — I872 Venous insufficiency (chronic) (peripheral): Secondary | ICD-10-CM | POA: Diagnosis not present

## 2015-10-30 DIAGNOSIS — R413 Other amnesia: Secondary | ICD-10-CM | POA: Diagnosis not present

## 2015-10-30 DIAGNOSIS — E538 Deficiency of other specified B group vitamins: Secondary | ICD-10-CM | POA: Diagnosis not present

## 2015-10-30 DIAGNOSIS — E1122 Type 2 diabetes mellitus with diabetic chronic kidney disease: Secondary | ICD-10-CM | POA: Diagnosis not present

## 2015-10-30 DIAGNOSIS — I480 Paroxysmal atrial fibrillation: Secondary | ICD-10-CM | POA: Diagnosis not present

## 2015-10-30 DIAGNOSIS — Z6832 Body mass index (BMI) 32.0-32.9, adult: Secondary | ICD-10-CM | POA: Diagnosis not present

## 2015-11-06 DIAGNOSIS — E538 Deficiency of other specified B group vitamins: Secondary | ICD-10-CM | POA: Diagnosis not present

## 2015-11-12 ENCOUNTER — Encounter: Payer: Medicare Other | Admitting: Internal Medicine

## 2015-11-14 ENCOUNTER — Encounter (HOSPITAL_COMMUNITY): Payer: Medicare Other | Attending: Hematology & Oncology

## 2015-11-14 DIAGNOSIS — D509 Iron deficiency anemia, unspecified: Secondary | ICD-10-CM | POA: Insufficient documentation

## 2015-11-14 LAB — CBC WITH DIFFERENTIAL/PLATELET
Basophils Absolute: 0.1 10*3/uL (ref 0.0–0.1)
Basophils Relative: 1 %
EOS ABS: 0.2 10*3/uL (ref 0.0–0.7)
EOS PCT: 4 %
HCT: 34 % — ABNORMAL LOW (ref 39.0–52.0)
Hemoglobin: 10.7 g/dL — ABNORMAL LOW (ref 13.0–17.0)
LYMPHS ABS: 0.9 10*3/uL (ref 0.7–4.0)
LYMPHS PCT: 17 %
MCH: 30.1 pg (ref 26.0–34.0)
MCHC: 31.5 g/dL (ref 30.0–36.0)
MCV: 95.8 fL (ref 78.0–100.0)
MONO ABS: 0.5 10*3/uL (ref 0.1–1.0)
MONOS PCT: 10 %
Neutro Abs: 3.6 10*3/uL (ref 1.7–7.7)
Neutrophils Relative %: 68 %
PLATELETS: 161 10*3/uL (ref 150–400)
RBC: 3.55 MIL/uL — AB (ref 4.22–5.81)
RDW: 15.8 % — ABNORMAL HIGH (ref 11.5–15.5)
WBC: 5.2 10*3/uL (ref 4.0–10.5)

## 2015-11-14 LAB — IRON AND TIBC
Iron: 57 ug/dL (ref 45–182)
SATURATION RATIOS: 15 % — AB (ref 17.9–39.5)
TIBC: 388 ug/dL (ref 250–450)
UIBC: 331 ug/dL

## 2015-11-14 LAB — FERRITIN: FERRITIN: 42 ng/mL (ref 24–336)

## 2015-11-16 ENCOUNTER — Telehealth (HOSPITAL_COMMUNITY): Payer: Self-pay | Admitting: *Deleted

## 2015-11-20 ENCOUNTER — Emergency Department (HOSPITAL_COMMUNITY)
Admission: EM | Admit: 2015-11-20 | Discharge: 2015-11-20 | Disposition: A | Discharge status: Home / Self Care | Payer: Medicare Other | Attending: Emergency Medicine | Admitting: Emergency Medicine

## 2015-11-20 ENCOUNTER — Encounter (HOSPITAL_COMMUNITY): Payer: Self-pay | Admitting: Emergency Medicine

## 2015-11-20 DIAGNOSIS — Z85828 Personal history of other malignant neoplasm of skin: Secondary | ICD-10-CM | POA: Insufficient documentation

## 2015-11-20 DIAGNOSIS — E785 Hyperlipidemia, unspecified: Secondary | ICD-10-CM | POA: Diagnosis not present

## 2015-11-20 DIAGNOSIS — I10 Essential (primary) hypertension: Secondary | ICD-10-CM | POA: Diagnosis not present

## 2015-11-20 DIAGNOSIS — Z7984 Long term (current) use of oral hypoglycemic drugs: Secondary | ICD-10-CM | POA: Diagnosis not present

## 2015-11-20 DIAGNOSIS — X58XXXA Exposure to other specified factors, initial encounter: Secondary | ICD-10-CM | POA: Insufficient documentation

## 2015-11-20 DIAGNOSIS — Z79899 Other long term (current) drug therapy: Secondary | ICD-10-CM | POA: Diagnosis not present

## 2015-11-20 DIAGNOSIS — Z87891 Personal history of nicotine dependence: Secondary | ICD-10-CM | POA: Insufficient documentation

## 2015-11-20 DIAGNOSIS — S01311A Laceration without foreign body of right ear, initial encounter: Secondary | ICD-10-CM | POA: Diagnosis present

## 2015-11-20 DIAGNOSIS — S01301A Unspecified open wound of right ear, initial encounter: Secondary | ICD-10-CM | POA: Diagnosis not present

## 2015-11-20 DIAGNOSIS — Y929 Unspecified place or not applicable: Secondary | ICD-10-CM | POA: Diagnosis not present

## 2015-11-20 DIAGNOSIS — E119 Type 2 diabetes mellitus without complications: Secondary | ICD-10-CM | POA: Insufficient documentation

## 2015-11-20 DIAGNOSIS — Y939 Activity, unspecified: Secondary | ICD-10-CM | POA: Diagnosis not present

## 2015-11-20 DIAGNOSIS — Y999 Unspecified external cause status: Secondary | ICD-10-CM | POA: Insufficient documentation

## 2015-11-20 LAB — I-STAT CHEM 8, ED
BUN: 24 mg/dL — ABNORMAL HIGH (ref 6–20)
Calcium, Ion: 1.1 mmol/L — ABNORMAL LOW (ref 1.13–1.30)
Chloride: 100 mmol/L — ABNORMAL LOW (ref 101–111)
Creatinine, Ser: 1.3 mg/dL — ABNORMAL HIGH (ref 0.61–1.24)
Glucose, Bld: 144 mg/dL — ABNORMAL HIGH (ref 65–99)
HEMATOCRIT: 32 % — AB (ref 39.0–52.0)
HEMOGLOBIN: 10.9 g/dL — AB (ref 13.0–17.0)
POTASSIUM: 3.6 mmol/L (ref 3.5–5.1)
SODIUM: 143 mmol/L (ref 135–145)
TCO2: 27 mmol/L (ref 0–100)

## 2015-11-20 MED ORDER — BACITRACIN ZINC 500 UNIT/GM EX OINT
TOPICAL_OINTMENT | CUTANEOUS | Status: AC
  Administered 2015-11-20: 06:00:00
  Filled 2015-11-20: qty 0.9

## 2015-11-20 MED ORDER — "THROMBI-PAD 3""X3"" EX PADS"
MEDICATED_PAD | CUTANEOUS | Status: AC
  Administered 2015-11-20: 1
  Filled 2015-11-20: qty 1

## 2015-11-20 MED ORDER — LIDOCAINE-EPINEPHRINE (PF) 2 %-1:200000 IJ SOLN
INTRAMUSCULAR | Status: AC
  Administered 2015-11-20: 20 mL
  Filled 2015-11-20: qty 20

## 2015-11-20 MED ORDER — HYDROGEN PEROXIDE 3 % EX SOLN
CUTANEOUS | Status: AC
  Administered 2015-11-20: 06:00:00
  Filled 2015-11-20: qty 473

## 2015-11-20 NOTE — ED Notes (Signed)
Patient verbalizes understanding of discharge instructions, home care and follow up care. Patient out of department at this time. 

## 2015-11-20 NOTE — ED Provider Notes (Signed)
CSN: [id number]     Arrival date & time 11/20/15  0501 History   First MD Initiated Contact with Patient 11/20/15 0518     Chief Complaint  Patient presents with  . Ear Laceration     (Consider location/radiation/quality/duration/timing/severity/associated sxs/prior Treatment) HPI  This is an 82 year old male with history of hypertension, atrial fibrillation on Xarelto, hyperlipidemia who presents with bleeding from his right ear. Patient reports that he has had a sore on his right ear for "some time." He states that it is related to wearing his C Pap at night. He noted a scab there. The scab fell off overnight and he woke up with blood all over him. He states that the ear is sore but not painful. He reports that he had blood all over the sheets and his clothes.  He was unable to get the bleeding to stop. He does have a history of iron deficiency and anemia.  Past Medical History  Diagnosis Date  . Hypertension   . Anxiety   . Hiatal hernia   . Skin cancer of lip   . Sinus drainage   . Type II diabetes mellitus ([place])   . GERD (gastroesophageal reflux disease)   . Arthritis   . Nephrolithiasis   . On home oxygen therapy     a. 2L w/CPAP at night  . Rocky Mountain spotted fever ~ 1945  . LBBB (left bundle branch block)   . Hyperlipidemia   . Atrial fibrillation ([place])     a. Dx 03/2013, notes report atrial fibrillation/atrial flutter, placed on amiodarone. NSR in subsequent OV's.  . Chest pain     a. H/o CTA negative for PE 2012, normal cath 2005, normal nucs previously including 05/2012.  . Orthostatic hypotension   . Complete heart block (HCC)     a. s/p Medtronic Adapta L model ADDRL 1 (serial number [id number]) pacemaker.  . OSA on CPAP     cpap  . Microcytic anemia   . B12 deficiency anemia   . Iron deficiency anemia    Past Surgical History  Procedure Laterality Date  . Vasectomy      Hx of   . Lumbar disc surgery  ~ 1993  . Inguinal hernia repair Right   . Cardiac  catheterization      by Dr. Acie Fredrickson, January 24, 2004, that shows minimal coronary artery irregularities and normal left ventricular function  . Cataract extraction w/ intraocular lens  implant, bilateral Bilateral   . Skin cancer excision      "lower lip" (04/06/2013)  . Temporary pacemaker insertion N/A 05/31/2014    Procedure: TEMPORARY WIRE;  Surgeon: Sinclair Grooms, MD;  Location[facility] CATH LAB;  Service: Cardiovascular;  Laterality: N/A;  . Permanent pacemaker insertion N/A 06/03/2014    MDT Adapta L implanted by Dr Rayann Heman for syncope and transient AV block  . Esophagogastroduodenoscopy (egd) with propofol N/A 06/21/2015    Procedure: ESOPHAGOGASTRODUODENOSCOPY (EGD) WITH PROPOFOL;  Surgeon: Gatha Mayer, MD;  Location: [facility] ENDOSCOPY;  Service: Endoscopy;  Laterality: N/A;   Family History  Problem Relation Age of Onset  . Other      Parents both died of old age; other conditions not known  . Stroke Brother   . Breast cancer Sister   . Pneumonia Father   . Thyroid disease Mother     goiter   Social History  Substance Use Topics  . Smoking status: Former Smoker -- 0.00 packs/day for 20 years  Types: Cigarettes  . Smokeless tobacco: Never Used     Comment: 04/06/2013 "quit smoking years ago; didn't smoke that much; probably 20 years; 1ppd"  . Alcohol Use: No    Review of Systems  HENT: Positive for ear pain.   Skin: Positive for wound.  Neurological: Negative for dizziness.  All other systems reviewed and are negative.     Allergies  Codeine and Citalopram  Home Medications   Prior to Admission medications   Medication Sig Start Date End Date Taking? Authorizing Provider  acetaminophen (TYLENOL) 500 MG tablet Take 500 mg by mouth every 6 (six) hours as needed for mild pain.    Historical Provider, MD  amiodarone (PACERONE) 200 MG tablet Take 200 mg by mouth 3 (three) times a week. On Monday, Wednesday & Friday    Historical Provider, MD  amLODipine (NORVASC) 5 MG  tablet TAKE 1 TABLET (5 MG TOTAL) BY MOUTH DAILY. 01/18/15   Thayer Headings, MD  amLODipine (NORVASC) 5 MG tablet TAKE 1 TABLET (5 MG TOTAL) BY MOUTH DAILY. 10/19/15   Thayer Headings, MD  benazepril (LOTENSIN) 40 MG tablet Take 40 mg by mouth daily.      Historical Provider, MD  cyanocobalamin (,VITAMIN B-12,) 1000 MCG/ML injection Inject 1,000 mcg into the muscle every 30 (thirty) days.    Historical Provider, MD  diazepam (VALIUM) 5 MG tablet Take 5 mg by mouth at bedtime.      Historical Provider, MD  fluticasone (FLONASE) 50 MCG/ACT nasal spray Place 1 spray into the nose daily as needed for allergies or rhinitis.     Historical Provider, MD  metFORMIN (GLUCOPHAGE) 1000 MG tablet Take 1,000 mg by mouth 2 (two) times daily with a meal.    Historical Provider, MD  metoprolol (LOPRESSOR) 50 MG tablet TAKE 1 TABLET BY MOUTH TWICE A DAY 07/06/15   Thayer Headings, MD  omeprazole (PRILOSEC) 20 MG capsule Take 20 mg by mouth daily.     Historical Provider, MD  OXYGEN Inhale into the lungs at bedtime. CPAP with OXYGEN    Historical Provider, MD  pioglitazone (ACTOS) 30 MG tablet Take 30 mg by mouth daily.      Historical Provider, MD  sertraline (ZOLOFT) 100 MG tablet Take 1 tablet by mouth daily. 08/21/14   Historical Provider, MD  XARELTO 20 MG TABS tablet TAKE 1 TABLET (20 MG TOTAL) BY MOUTH DAILY WITH SUPPER. 07/23/15   Thayer Headings, MD   BP 161/71 mmHg  Pulse 63  Temp(Src) 98 F (36.7 C)  Resp 20  Ht 6\' 3"  (1.905 m)  Wt 257 lb (116.574 kg)  BMI 32.12 kg/m2  SpO2 96% Physical Exam  Constitutional: He is oriented to person, place, and time. No distress.  Elderly, no acute distress  HENT:  Head: Normocephalic.  0.5 x 0.5 centimeter defect noted over the pinna of the right ear, brisk bleeding noted over superior portion of the wound, dried blood noted about the scalp and face  Cardiovascular: Normal rate and regular rhythm.   Pulmonary/Chest: Effort normal. No respiratory distress.   Musculoskeletal: He exhibits edema.  1+ lower extremity edema  Neurological: He is alert and oriented to person, place, and time.  Skin: Skin is warm and dry.  Psychiatric: He has a normal mood and affect.  Nursing note and vitals reviewed.   ED Course  Procedures (including critical care time)  LACERATION REPAIR Performed by: Merryl Hacker Authorized by: Merryl Hacker Consent: Verbal consent  obtained. Risks and benefits: risks, benefits and alternatives were discussed Consent given by: patient Patient identity confirmed: provided demographic data Prepped and Draped in normal sterile fashion Wound explored  Laceration Location: ear   Laceration Length: 0.5 cm  No Foreign Bodies seen or palpated  Anesthesia: local infiltration  Local anesthetic: lidocaine 1% w epinephrine  Anesthetic total: 1 ml  Irrigation method: syringe Amount of cleaning: standard   Number of sutures: 1  Technique: 4-0 Vicryl figure-of-eight stitch to tie off bleeder  Patient tolerance: Patient tolerated the procedure well with no immediate complications.  Labs Review Labs Reviewed  I-STAT CHEM 8, ED - Abnormal; Notable for the following:    Chloride 100 (*)    BUN 24 (*)    Creatinine, Ser 1.30 (*)    Glucose, Bld 144 (*)    Calcium, Ion 1.10 (*)    Hemoglobin 10.9 (*)    HCT 32.0 (*)    All other components within normal limits    Imaging Review No results found. I have personally reviewed and evaluated these images and lab results as part of my medical decision-making.   EKG Interpretation None      MDM   Final diagnoses:  Ear wound, right, initial encounter    Patient presents with bleeding from the right ear. Wound noted with brisk venous bleeding. Patient is on Xarelto. Bleeding was controlled with lidocaine with epinephrine injection and figure-of-eight stitch. The wound is open. No other signs of infection. Wound was dressed with antibiotic ointment and a  thrombin pad. No recurrent bleeding noted in the ER. Hemoglobin is stable at 10.9. Patient will need wound recheck in 2 days. Patient was given instructions regarding dressing changes.  After history, exam, and medical workup I feel the patient has been appropriately medically screened and is safe for discharge home. Pertinent diagnoses were discussed with the patient. Patient was given return precautions.     Merryl Hacker, MD 11/20/15 [phone]

## 2015-11-20 NOTE — ED Notes (Signed)
Pt states his rt ear has been sore since yesterday and started bleeding through the night.

## 2015-11-20 NOTE — Discharge Instructions (Signed)
You were seen today for a wound on her right ear. It was bleeding. One stitch was placed to tie off a bleeder. You need to keep the wound covered. Apply antibiotic ointment and dressings. You need to follow-up with her primary doctor in 2 days for recheck. Your hemoglobin is stable. If you develop any new or worsening bleeding you need to be reevaluated immediately.

## 2015-11-22 ENCOUNTER — Encounter (HOSPITAL_BASED_OUTPATIENT_CLINIC_OR_DEPARTMENT_OTHER): Payer: Medicare Other

## 2015-11-22 DIAGNOSIS — D649 Anemia, unspecified: Secondary | ICD-10-CM

## 2015-11-22 MED ORDER — SODIUM CHLORIDE 0.9 % IV SOLN
750.0000 mg | Freq: Once | INTRAVENOUS | Status: AC
  Administered 2015-11-22: 750 mg via INTRAVENOUS
  Filled 2015-11-22: qty 15

## 2015-11-22 NOTE — Progress Notes (Signed)
Tolerated iron infusion well. Ambulatory on discharge home with wife. 

## 2015-11-22 NOTE — Patient Instructions (Signed)
[employer] at [facility] Discharge Instructions  RECOMMENDATIONS MADE BY THE CONSULTANT AND ANY TEST RESULTS WILL BE SENT TO YOUR REFERRING PHYSICIAN.  Today you received Injectofer iron infusion as ordered. Return as scheduled.  Thank you for choosing [place] at [facility] to provide your oncology and hematology care.  To afford each patient quality time with our provider, please arrive at least 15 minutes before your scheduled appointment time.   Beginning January 23rd 2017 lab work for the [facility] will be done in the  Main lab at [facility] on 1st floor. If you have a lab appointment with the [facility] please come in thru the  Main Entrance and check in at the main information desk  You need to re-schedule your appointment should you arrive 10 or more minutes late.  We strive to give you quality time with our providers, and arriving late affects you and other patients whose appointments are after yours.  Also, if you no show three or more times for appointments you may be dismissed from the clinic at the providers discretion.     Again, thank you for choosing [facility].  Our hope is that these requests will decrease the amount of time that you wait before being seen by our physicians.       _____________________________________________________________  Should you have questions after your visit to [facility], please contact our office at [phone] between the hours of 8:30 a.m. and 4:30 p.m.  Voicemails left after 4:30 p.m. will not be returned until the following business day.  For prescription refill requests, have your pharmacy contact our office.         Resources For Cancer Patients and their Caregivers ? American Cancer Society: Can assist with transportation, wigs, general needs, runs Look Good Feel Better.        680-819-0454 ? Cancer Care: Provides financial assistance, online  support groups, medication/co-pay assistance.  1-800-813-HOPE (802)350-1792) ? [place] Assists [place] cancer patients and their families through emotional , educational and financial support.  628-680-8955 ? [place] DSS Where to apply for food stamps, Medicaid and utility assistance. 9561799411 ? [facility]: Transportation to medical appointments. (313)718-8926 ? Social Security Administration: May apply for disability if have a Stage IV cancer. 925-286-3883 (620) 773-0125 ? [employer], Disability and Transit Services: Assists with nutrition, care and transit needs. (531) 187-3688

## 2015-11-23 DIAGNOSIS — S01311A Laceration without foreign body of right ear, initial encounter: Secondary | ICD-10-CM | POA: Diagnosis not present

## 2015-11-23 DIAGNOSIS — L989 Disorder of the skin and subcutaneous tissue, unspecified: Secondary | ICD-10-CM | POA: Diagnosis not present

## 2015-11-23 DIAGNOSIS — D649 Anemia, unspecified: Secondary | ICD-10-CM | POA: Diagnosis not present

## 2015-12-03 DIAGNOSIS — L820 Inflamed seborrheic keratosis: Secondary | ICD-10-CM | POA: Diagnosis not present

## 2015-12-03 DIAGNOSIS — D485 Neoplasm of uncertain behavior of skin: Secondary | ICD-10-CM | POA: Diagnosis not present

## 2015-12-04 DIAGNOSIS — C44222 Squamous cell carcinoma of skin of right ear and external auricular canal: Secondary | ICD-10-CM | POA: Diagnosis not present

## 2015-12-11 DIAGNOSIS — E538 Deficiency of other specified B group vitamins: Secondary | ICD-10-CM | POA: Diagnosis not present

## 2015-12-28 ENCOUNTER — Ambulatory Visit (INDEPENDENT_AMBULATORY_CARE_PROVIDER_SITE_OTHER): Payer: Medicare Other | Admitting: *Deleted

## 2015-12-28 DIAGNOSIS — I442 Atrioventricular block, complete: Secondary | ICD-10-CM | POA: Diagnosis not present

## 2015-12-28 NOTE — Progress Notes (Signed)
Carelink Summary Report / Loop Recorder 

## 2015-12-31 DIAGNOSIS — C44222 Squamous cell carcinoma of skin of right ear and external auricular canal: Secondary | ICD-10-CM | POA: Diagnosis not present

## 2016-01-14 ENCOUNTER — Ambulatory Visit: Payer: Medicare Other | Admitting: Cardiovascular Disease

## 2016-01-15 DIAGNOSIS — Z9181 History of falling: Secondary | ICD-10-CM | POA: Diagnosis not present

## 2016-01-15 DIAGNOSIS — E119 Type 2 diabetes mellitus without complications: Secondary | ICD-10-CM | POA: Diagnosis not present

## 2016-01-15 DIAGNOSIS — R8299 Other abnormal findings in urine: Secondary | ICD-10-CM | POA: Diagnosis not present

## 2016-01-15 DIAGNOSIS — R51 Headache: Secondary | ICD-10-CM | POA: Diagnosis not present

## 2016-01-15 DIAGNOSIS — D649 Anemia, unspecified: Secondary | ICD-10-CM | POA: Diagnosis not present

## 2016-01-15 DIAGNOSIS — E538 Deficiency of other specified B group vitamins: Secondary | ICD-10-CM | POA: Diagnosis not present

## 2016-01-15 DIAGNOSIS — R413 Other amnesia: Secondary | ICD-10-CM | POA: Diagnosis not present

## 2016-01-15 DIAGNOSIS — I480 Paroxysmal atrial fibrillation: Secondary | ICD-10-CM | POA: Diagnosis not present

## 2016-01-15 DIAGNOSIS — N390 Urinary tract infection, site not specified: Secondary | ICD-10-CM | POA: Diagnosis not present

## 2016-01-15 DIAGNOSIS — M5380 Other specified dorsopathies, site unspecified: Secondary | ICD-10-CM | POA: Diagnosis not present

## 2016-01-30 ENCOUNTER — Encounter: Payer: Self-pay | Admitting: Cardiology

## 2016-01-31 ENCOUNTER — Ambulatory Visit: Payer: Medicare Other | Admitting: Cardiovascular Disease

## 2016-02-01 ENCOUNTER — Encounter: Payer: Self-pay | Admitting: Cardiovascular Disease

## 2016-02-01 ENCOUNTER — Ambulatory Visit (INDEPENDENT_AMBULATORY_CARE_PROVIDER_SITE_OTHER): Payer: Medicare Other | Admitting: Cardiovascular Disease

## 2016-02-01 VITALS — BP 126/68 | HR 60 | Ht 75.0 in | Wt 247.0 lb

## 2016-02-01 DIAGNOSIS — I480 Paroxysmal atrial fibrillation: Secondary | ICD-10-CM | POA: Diagnosis not present

## 2016-02-01 DIAGNOSIS — R531 Weakness: Secondary | ICD-10-CM | POA: Diagnosis not present

## 2016-02-01 DIAGNOSIS — I10 Essential (primary) hypertension: Secondary | ICD-10-CM

## 2016-02-01 MED ORDER — AMLODIPINE BESYLATE 5 MG PO TABS: ORAL_TABLET | ORAL | Status: DC

## 2016-02-01 NOTE — Progress Notes (Signed)
Cardiology Office Note   Date:  02/01/2016   ID:  Richard [name], DOB [birth year], MRN [id number]  PCP:  Tivis Ringer, MD  Cardiologist:   Mertie Moores, MD   Chief Complaint  Patient presents with  . Follow-up    PAF    Problem List 1. Atrial fib 2. Essential Hypertension 3. Microcytic anemia  4. Pacer 5, diabetes Mellitus    Jan 17, 2015:  DURWOOD [name] is a 82 y.o. male who presents for  His atrial fib Having lots of problems with anemia.  No blood in stool Being set up to see a hematologist  Still eating salt frequently  Nov. 22, 2016:    Doing well from a cardiac standpoint. Having back pain . , had an injection in his back . Having problems with anemia   February 01, 2016:  Doing great from a cardiac standpoint Is falling frequently .   Is very weak Has been seen by EP - thought not to be related to bradycardia or hypotension .  Possibly dehydration Legs are wobbly  Discussed orthostasis - his symptoms do not sound like orthstasis   Past Medical History  Diagnosis Date  . Hypertension   . Anxiety   . Hiatal hernia   . Skin cancer of lip   . Sinus drainage   . Type II diabetes mellitus ([place])   . GERD (gastroesophageal reflux disease)   . Arthritis   . Nephrolithiasis   . On home oxygen therapy     a. 2L w/CPAP at night  . [place] spotted fever ~ 1945  . LBBB (left bundle branch block)   . Hyperlipidemia   . Atrial fibrillation ([place])     a. Dx 03/2013, notes report atrial fibrillation/atrial flutter, placed on amiodarone. NSR in subsequent OV's.  . Chest pain     a. H/o CTA negative for PE 2012, normal cath 2005, normal nucs previously including 05/2012.  . Orthostatic hypotension   . Complete heart block (H[facility])     a. s/p Medtronic Adapta L model ADDRL 1 (serial number [id number]) pacemaker.  . OSA on CPAP     cpap  . Microcytic anemia   . B12 deficiency anemia   . Iron deficiency anemia     Past Surgical History  Procedure  Laterality Date  . Vasectomy      Hx of   . Lumbar disc surgery  ~ 1993  . Inguinal hernia repair Right   . Cardiac catheterization      by Dr. Acie Fredrickson, January 24, 2004, that shows minimal coronary artery irregularities and normal left ventricular function  . Cataract extraction w/ intraocular lens  implant, bilateral Bilateral   . Skin cancer excision      "lower lip" (04/06/2013)  . Temporary pacemaker insertion N/A 05/31/2014    Procedure: TEMPORARY WIRE;  Surgeon: Sinclair Grooms, MD;  Location: [facility] CATH LAB;  Service: Cardiovascular;  Laterality: N/A;  . Permanent pacemaker insertion N/A 06/03/2014    MDT Adapta L implanted by Dr Rayann Heman for syncope and transient AV block  . Esophagogastroduodenoscopy (egd) with propofol N/A 06/21/2015    Procedure: ESOPHAGOGASTRODUODENOSCOPY (EGD) WITH PROPOFOL;  Surgeon: Gatha Mayer, MD;  Location: [facility] ENDOSCOPY;  Service: Endoscopy;  Laterality: N/A;     Current Outpatient Prescriptions  Medication Sig Dispense Refill  . acetaminophen (TYLENOL) 500 MG tablet Take 500 mg by mouth every 6 (six) hours as needed for mild pain.    Marland Kitchen  amiodarone (PACERONE) 200 MG tablet Take 200 mg by mouth 3 (three) times a week. On Monday, Wednesday & Friday    . amLODipine (NORVASC) 5 MG tablet TAKE 1 TABLET (5 MG TOTAL) BY MOUTH DAILY. 90 tablet 2  . benazepril (LOTENSIN) 40 MG tablet Take 40 mg by mouth daily.      . cyanocobalamin (,VITAMIN B-12,) 1000 MCG/ML injection Inject 1,000 mcg into the muscle every 30 (thirty) days.    . diazepam (VALIUM) 5 MG tablet Take 5 mg by mouth at bedtime.      . fluticasone (FLONASE) 50 MCG/ACT nasal spray Place 1 spray into the nose daily as needed for allergies or rhinitis.     . metFORMIN (GLUCOPHAGE) 1000 MG tablet Take 1,000 mg by mouth 2 (two) times daily with a meal.    . metoprolol (LOPRESSOR) 50 MG tablet TAKE 1 TABLET BY MOUTH TWICE A DAY 60 tablet 11  . omeprazole (PRILOSEC) 20 MG capsule Take 20 mg by mouth daily.     .  OXYGEN Inhale into the lungs at bedtime. CPAP with OXYGEN    . pioglitazone (ACTOS) 30 MG tablet Take 30 mg by mouth daily.      . sertraline (ZOLOFT) 100 MG tablet Take 1 tablet by mouth daily.    Alveda Reasons 20 MG TABS tablet TAKE 1 TABLET (20 MG TOTAL) BY MOUTH DAILY WITH SUPPER. 30 tablet 11   No current facility-administered medications for this visit.    Allergies:   Codeine and Citalopram    Social History:  The patient  reports that he has quit smoking. His smoking use included Cigarettes. He smoked 0.00 packs per day for 20 years. He has never used smokeless tobacco. He reports that he does not drink alcohol or use illicit drugs.   Family History:  The patient's family history includes Breast cancer in his sister; Pneumonia in his father; Stroke in his brother; Thyroid disease in his mother.    ROS:  Please see the history of present illness.    Review of Systems: Constitutional:  denies fever, chills, diaphoresis, appetite change and fatigue.  HEENT: denies photophobia, eye pain, redness, hearing loss, ear pain, congestion, sore throat, rhinorrhea, sneezing, neck pain, neck stiffness and tinnitus.  Respiratory: denies SOB, DOE, cough, chest tightness, and wheezing.  Cardiovascular: denies chest pain, palpitations and leg swelling.  Gastrointestinal: denies nausea, vomiting, abdominal pain, diarrhea, constipation, blood in stool.  Genitourinary: denies dysuria, urgency, frequency, hematuria, flank pain and difficulty urinating.  Musculoskeletal: denies  myalgias, back pain, joint swelling, arthralgias and gait problem.   Skin: denies pallor, rash and wound.  Neurological: denies dizziness, seizures, syncope, weakness, light-headedness, numbness and headaches.   Hematological: denies adenopathy, easy bruising, personal or family bleeding history.  Psychiatric/ Behavioral: denies suicidal ideation, mood changes, confusion, nervousness, sleep disturbance and agitation.        All other systems are reviewed and negative.    PHYSICAL EXAM: VS:  BP 126/68 mmHg  Pulse 60  Ht 6\' 3"  (1.905 m)  Wt 247 lb (112.038 kg)  BMI 30.87 kg/m2 , BMI Body mass index is 30.87 kg/(m^2). GEN: Well nourished, well developed, in no acute distress HEENT: normal Neck: no JVD, carotid bruits, or masses Cardiac: RRR; no murmurs, rubs, or gallops,no edema  Respiratory:  clear to auscultation bilaterally, normal work of breathing GI: soft, nontender, nondistended, + BS MS: no deformity or atrophy Skin: warm and dry, no rash Neuro:  Strength and sensation are intact Psych:  normal   EKG:  EKG is not ordered today.   Recent Labs: 05/15/2015: ALT 21 11/14/2015: Platelets 161 11/20/2015: BUN 24*; Creatinine, Ser 1.30*; Hemoglobin 10.9*; Potassium 3.6; Sodium 143    Lipid Panel    Component Value Date/Time   CHOL 111 06/01/2014 0440   TRIG 159* 06/01/2014 0440   HDL 31* 06/01/2014 0440   CHOLHDL 3.6 06/01/2014 0440   VLDL 32 06/01/2014 0440   LDLCALC 48 06/01/2014 0440      Wt Readings from Last 3 Encounters:  02/01/16 247 lb (112.038 kg)  11/20/15 257 lb (116.574 kg)  10/01/15 254 lb 12.8 oz (115.577 kg)      Other studies Reviewed: Additional studies/ records that were reviewed today include: . Review of the above records demonstrates:    ASSESSMENT AND PLAN:   1.   Frequent falls.  Mr. Jopp is having frequent falling. He states that his legs are very weak and he loses his balance very easily.  He seems to drift to one side with walking. I attempted to sort out whether or not these episodes are related to orthostatic hypertension. I doubt that they are due to orthostatic hypotension but I am willing to hold his amlodipine for now to see if this helps with his symptoms in any way. I suspect that he has neuromuscular etiology for this these weak and wobbly episodes.   I think that he should probably see neurology.  He  should  start using a walker or a cane for  balance.   He will call back in 3 weeks with reported his blood pressure readings and also a report of if he has improved off the amlodipine. If his symptoms are better then we'll continue to hold the amlodipine. If the symptoms are the same and his blood pressures elevated and we will restart amlodipine-perhaps at a lower dose.  2. Atrial fib- his rate is well-controlled. He now has a pacemaker. He is currently on Xarelto. He's having problems with anemia.  3. Essential Hypertension - blood pressure is well controlled.   Continue current meds.   4. Pacer - followed by Dr. Rayann Heman   5, diabetes Mellitus  - followed by his medical doctor    Current medicines are reviewed at length with the patient today.  The patient does not have concerns regarding medicines.  The following changes have been made:  no change  Labs/ tests ordered today include:  No orders of the defined types were placed in this encounter.     Disposition:   FU with me in 6 months      Mertie Moores, MD  02/01/2016 8:14 AM    [facility] [address], [place], [place]  [address] Phone: [phone]; Fax: [phone]

## 2016-02-01 NOTE — Patient Instructions (Signed)
**Note De-Identified  Obfuscation** Medication Instructions:  Stop taking Amlodipine for now-all other medications will remain the same.  Labwork: None  Testing/Procedures: None  Follow-Up: Your physician wants you to follow-up in: 6 months. You will receive a reminder letter in the mail two months in advance. If you don't receive a letter, please call our office to schedule the follow-up appointment.   Any Other Special Instructions Will Be Listed Below (If Applicable). Call Sharyn Lull, Dr Elmarie Shiley nurse, at [phone] in a couple weeks to let us know how you are doing since stopping Amlodipine.     If you need a refill on your cardiac medications before your next appointment, please call your pharmacy.

## 2016-02-04 LAB — CUP PACEART REMOTE DEVICE CHECK
Battery Remaining Longevity: 161 mo
Battery Voltage: 2.8 V
Implantable Lead Implant Date: 20151010
Implantable Lead Location: 753859
Implantable Lead Model: 5076
Lead Channel Impedance Value: 474 Ohm
Lead Channel Pacing Threshold Pulse Width: 0.4 ms
Lead Channel Setting Pacing Amplitude: 2 V
Lead Channel Setting Pacing Amplitude: 2.5 V
Lead Channel Setting Pacing Pulse Width: 0.4 ms
MDC IDC LEAD IMPLANT DT: 20151010
MDC IDC LEAD LOCATION: 753860
MDC IDC MSMT BATTERY IMPEDANCE: 100 Ohm
MDC IDC MSMT LEADCHNL RA PACING THRESHOLD AMPLITUDE: 0.5 V
MDC IDC MSMT LEADCHNL RA PACING THRESHOLD PULSEWIDTH: 0.4 ms
MDC IDC MSMT LEADCHNL RA SENSING INTR AMPL: 0.7 mV
MDC IDC MSMT LEADCHNL RV IMPEDANCE VALUE: 842 Ohm
MDC IDC MSMT LEADCHNL RV PACING THRESHOLD AMPLITUDE: 0.625 V
MDC IDC MSMT LEADCHNL RV SENSING INTR AMPL: 16 mV
MDC IDC SESS DTM: 20170505131452
MDC IDC SET LEADCHNL RV SENSING SENSITIVITY: 5.6 mV
MDC IDC STAT BRADY AP VP PERCENT: 0 %
MDC IDC STAT BRADY AP VS PERCENT: 17 %
MDC IDC STAT BRADY AS VP PERCENT: 4 %
MDC IDC STAT BRADY AS VS PERCENT: 79 %

## 2016-02-06 ENCOUNTER — Ambulatory Visit (HOSPITAL_COMMUNITY): Payer: Medicare Other | Admitting: Oncology

## 2016-02-06 ENCOUNTER — Other Ambulatory Visit (HOSPITAL_COMMUNITY): Payer: Medicare Other

## 2016-02-08 ENCOUNTER — Encounter (HOSPITAL_COMMUNITY): Payer: Medicare Other

## 2016-02-08 ENCOUNTER — Encounter (HOSPITAL_COMMUNITY): Payer: Medicare Other | Attending: Hematology & Oncology | Admitting: Hematology & Oncology

## 2016-02-08 VITALS — BP 155/78 | HR 61 | Temp 97.8°F | Resp 20 | Wt 250.0 lb

## 2016-02-08 DIAGNOSIS — E611 Iron deficiency: Secondary | ICD-10-CM

## 2016-02-08 DIAGNOSIS — R5383 Other fatigue: Secondary | ICD-10-CM

## 2016-02-08 DIAGNOSIS — N183 Chronic kidney disease, stage 3 (moderate): Secondary | ICD-10-CM

## 2016-02-08 DIAGNOSIS — D649 Anemia, unspecified: Secondary | ICD-10-CM | POA: Diagnosis not present

## 2016-02-08 DIAGNOSIS — R700 Elevated erythrocyte sedimentation rate: Secondary | ICD-10-CM

## 2016-02-08 DIAGNOSIS — D509 Iron deficiency anemia, unspecified: Secondary | ICD-10-CM

## 2016-02-08 LAB — IRON AND TIBC
IRON: 54 ug/dL (ref 45–182)
Saturation Ratios: 16 % — ABNORMAL LOW (ref 17.9–39.5)
TIBC: 336 ug/dL (ref 250–450)
UIBC: 282 ug/dL

## 2016-02-08 LAB — CBC WITH DIFFERENTIAL/PLATELET
Basophils Absolute: 0 10*3/uL (ref 0.0–0.1)
Basophils Relative: 1 %
EOS ABS: 0.1 10*3/uL (ref 0.0–0.7)
EOS PCT: 3 %
HCT: 31.8 % — ABNORMAL LOW (ref 39.0–52.0)
Hemoglobin: 10.2 g/dL — ABNORMAL LOW (ref 13.0–17.0)
LYMPHS ABS: 1.1 10*3/uL (ref 0.7–4.0)
Lymphocytes Relative: 25 %
MCH: 30.4 pg (ref 26.0–34.0)
MCHC: 32.1 g/dL (ref 30.0–36.0)
MCV: 94.6 fL (ref 78.0–100.0)
MONOS PCT: 12 %
Monocytes Absolute: 0.5 10*3/uL (ref 0.1–1.0)
Neutro Abs: 2.7 10*3/uL (ref 1.7–7.7)
Neutrophils Relative %: 59 %
PLATELETS: 201 10*3/uL (ref 150–400)
RBC: 3.36 MIL/uL — AB (ref 4.22–5.81)
RDW: 16.6 % — ABNORMAL HIGH (ref 11.5–15.5)
WBC: 4.6 10*3/uL (ref 4.0–10.5)

## 2016-02-08 LAB — FERRITIN: FERRITIN: 150 ng/mL (ref 24–336)

## 2016-02-08 NOTE — Patient Instructions (Signed)
[place] at [facility] Discharge Instructions  RECOMMENDATIONS MADE BY THE CONSULTANT AND ANY TEST RESULTS WILL BE SENT TO YOUR REFERRING PHYSICIAN.   Return to clinic in 6 months with labs  Ferritin/CBC diff every 3 months   Thank you for choosing [place] at [facility] to provide your oncology and hematology care.  To afford each patient quality time with our provider, please arrive at least 15 minutes before your scheduled appointment time.   Beginning January 23rd 2017 lab work for the [facility] will be done in the  Main lab at [facility] on 1st floor. If you have a lab appointment with the [facility] please come in thru the  Main Entrance and check in at the main information desk  You need to re-schedule your appointment should you arrive 10 or more minutes late.  We strive to give you quality time with our providers, and arriving late affects you and other patients whose appointments are after yours.  Also, if you no show three or more times for appointments you may be dismissed from the clinic at the providers discretion.     Again, thank you for choosing [facility].  Our hope is that these requests will decrease the amount of time that you wait before being seen by our physicians.       _____________________________________________________________  Should you have questions after your visit to [facility], please contact our office at [phone] between the hours of 8:30 a.m. and 4:30 p.m.  Voicemails left after 4:30 p.m. will not be returned until the following business day.  For prescription refill requests, have your pharmacy contact our office.         Resources For Cancer Patients and their Caregivers ? American Cancer Society: Can assist with transportation, wigs, general needs, runs Look Good Feel Better.        2795338190 ? Cancer Care: Provides financial assistance, online  support groups, medication/co-pay assistance.  1-800-813-HOPE 857-383-4138) ? [place] Assists [place] cancer patients and their families through emotional , educational and financial support.  (601)549-7638 ? [place] DSS Where to apply for food stamps, Medicaid and utility assistance. 540-861-2289 ? [facility]: Transportation to medical appointments. 630-236-6614 ? Social Security Administration: May apply for disability if have a Stage IV cancer. (623)759-6241 820-856-8902 ? [employer], Disability and Transit Services: Assists with nutrition, care and transit needs. [place] Support Programs: @10RELATIVEDAYS @ > Cancer Support Group  2nd Tuesday of the month 1pm-2pm, Journey Room  > Creative Journey  3rd Tuesday of the month 1130am-1pm, Journey Room  > Look Good Feel Better  1st Wednesday of the month 10am-12 noon, Journey Room (Call [place] to register [phone])

## 2016-02-08 NOTE — Progress Notes (Signed)
[facility] NOTE  Patient Care Team: Prince Solian, MD as PCP - General (Internal Medicine) Thayer Headings, MD as Consulting Physician (Cardiology)  CHIEF COMPLAINTS:  Anemia Iron deficiency Elevated ESR, RF  HISTORY OF PRESENTING ILLNESS:  CHIEF WALKUP 82 y.o. male is here because of Anemia. Last dose of IV iron was given in March.   Mr. Detter is accompanied by his wife. Ambulates with use of cane. I personally reviewed and went over laboratory studies with the patient.  He admits he is still falling. His most recent fall was last week after being on the tractor for 3 hours. He denies dizziness. He reports weakness and uses a cane to ambulate. States he wobbles when he walks but he is not dizzy-headed. He becomes weak and cannot walk. He is afraid of his blood pressure going up and having a stroke.   When speaking about an iron transfusion, his wife asks whether he can get a blood transfusion instead. He previously received 2 pints of blood at [facility] several years ago and felt good afterwards. His wife is curious whether he can routinely receive iron transfusions. She is afraid he will have a mini-stroke or a heart attack. He has no difficulty with IV iron. Blood counts have been improved with ongoing iron replacement and he has not required transfusion in a long while.  He has never had any physical therapy. His wife notes he has been offered physical therapy in the past. He is able to get out of his chair without the use of his cane or arms. He is able to ambulate across the room without his cane stating, "I have to take my time". Further explaining, "I'm weak as water". He believes he cannot do therapy as he is too weak.    MEDICAL HISTORY:  Past Medical History  Diagnosis Date  . Hypertension   . Anxiety   . Hiatal hernia   . Skin cancer of lip   . Sinus drainage   . Type II diabetes mellitus ([place])   . GERD (gastroesophageal  reflux disease)   . Arthritis   . Nephrolithiasis   . On home oxygen therapy     a. 2L w/CPAP at night  . [place] spotted fever ~ 1945  . LBBB (left bundle branch block)   . Hyperlipidemia   . Atrial fibrillation ([place])     a. Dx 03/2013, notes report atrial fibrillation/atrial flutter, placed on amiodarone. NSR in subsequent OV's.  . Chest pain     a. H/o CTA negative for PE 2012, normal cath 2005, normal nucs previously including 05/2012.  . Orthostatic hypotension   . Complete heart block (H[facility])     a. s/p Medtronic Adapta L model ADDRL 1 (serial number [id number]) pacemaker.  . OSA on CPAP     cpap  . Microcytic anemia   . B12 deficiency anemia   . Iron deficiency anemia     SURGICAL HISTORY: Past Surgical History  Procedure Laterality Date  . Vasectomy      Hx of   . Lumbar disc surgery  ~ 1993  . Inguinal hernia repair Right   . Cardiac catheterization      by Dr. Acie Fredrickson, January 24, 2004, that shows minimal coronary artery irregularities and normal left ventricular function  . Cataract extraction w/ intraocular lens  implant, bilateral Bilateral   . Skin cancer excision      "lower lip" (04/06/2013)  .  Temporary pacemaker insertion N/A 05/31/2014    Procedure: TEMPORARY WIRE;  Surgeon: Sinclair Grooms, MD;  Location: [facility] CATH LAB;  Service: Cardiovascular;  Laterality: N/A;  . Permanent pacemaker insertion N/A 06/03/2014    MDT Adapta L implanted by Dr Rayann Heman for syncope and transient AV block  . Esophagogastroduodenoscopy (egd) with propofol N/A 06/21/2015    Procedure: ESOPHAGOGASTRODUODENOSCOPY (EGD) WITH PROPOFOL;  Surgeon: Gatha Mayer, MD;  Location: [facility] ENDOSCOPY;  Service: Endoscopy;  Laterality: N/A;    SOCIAL HISTORY: Social History   Social History  . Marital Status: Married    Spouse Name: N/A  . Number of Children: 2  . Years of Education: N/A   Occupational History  . retired    Social History Main Topics  . Smoking status: Former Smoker --  0.00 packs/day for 20 years    Types: Cigarettes  . Smokeless tobacco: Never Used     Comment: 04/06/2013 "quit smoking years ago; didn't smoke that much; probably 20 years; 1ppd"  . Alcohol Use: No  . Drug Use: No  . Sexual Activity: No   Other Topics Concern  . Not on file   Social History Narrative  2 children. Married for 58 years. 6 grandchildren 3 great grandchildren Ex-smoker, quit over 71 yrs ago  ETOH, none Used to play golf, used to work at [employer]: Family History  Problem Relation Age of Onset  . Other      Parents both died of old age; other conditions not known  . Stroke Brother   . Breast cancer Sister   . Pneumonia Father   . Thyroid disease Mother     goiter   indicated that his mother is deceased. He indicated that his father is deceased.    Mother passed at 20 of maybe pnenomia, she was in a nursing home Father passed when he was 26 Has 43 brothers and sister, 3 passed Sister has breast cancer   ALLERGIES:  is allergic to codeine and citalopram.  MEDICATIONS:  Current Outpatient Prescriptions  Medication Sig Dispense Refill  . acetaminophen (TYLENOL) 500 MG tablet Take 500 mg by mouth every 6 (six) hours as needed for mild pain.    Marland Kitchen amiodarone (PACERONE) 200 MG tablet Take 200 mg by mouth 3 (three) times a week. On Monday, Wednesday & Friday    . amLODipine (NORVASC) 5 MG tablet TAKE 1 TABLET (5 MG TOTAL) BY MOUTH DAILY. **on hold** 90 tablet 2  . benazepril (LOTENSIN) 40 MG tablet Take 40 mg by mouth daily.      . cyanocobalamin (,VITAMIN B-12,) 1000 MCG/ML injection Inject 1,000 mcg into the muscle every 30 (thirty) days.    . diazepam (VALIUM) 5 MG tablet Take 5 mg by mouth at bedtime.      . fluticasone (FLONASE) 50 MCG/ACT nasal spray Place 1 spray into the nose daily as needed for allergies or rhinitis.     . metFORMIN (GLUCOPHAGE) 1000 MG tablet Take 1,000 mg by mouth 2 (two) times daily with a meal.    . metoprolol  (LOPRESSOR) 50 MG tablet TAKE 1 TABLET BY MOUTH TWICE A DAY 60 tablet 11  . omeprazole (PRILOSEC) 20 MG capsule Take 20 mg by mouth daily.     . OXYGEN Inhale into the lungs at bedtime. CPAP with OXYGEN    . pioglitazone (ACTOS) 30 MG tablet Take 30 mg by mouth daily.      . sertraline (ZOLOFT) 100 MG tablet  Take 1 tablet by mouth daily.    Alveda Reasons 20 MG TABS tablet TAKE 1 TABLET (20 MG TOTAL) BY MOUTH DAILY WITH SUPPER. 30 tablet 11   No current facility-administered medications for this visit.    Review of Systems  Constitutional: Positive for malaise/fatigue. HENT: Negative.  Edentulous  Eyes:       Dry eyes, takes medication  Respiratory: Negative.   Cardiovascular: Negative.   Gastrointestinal: Negative.   Genitourinary:  Musculoskeletal: Positive for myalgias and joint pain.  Skin: Negative.   Neurological: Positive for weakness.  Leg weakness Endo/Heme/Allergies: Negative.   Psychiatric/Behavioral: Negative.   All other systems reviewed and are negative.  14 point ROS was done and is otherwise as detailed above or in HPI   PHYSICAL EXAMINATION: ECOG PERFORMANCE STATUS: 1 - Symptomatic but completely ambulatory  Filed Vitals:   02/08/16 1307  BP: 155/78  Pulse: 61  Temp: 97.8 F (36.6 C)  Resp: 20   Filed Weights   02/08/16 1307  Weight: 250 lb (113.399 kg)    Physical Exam  Constitutional: He is oriented to person, place, and time and well-developed, well-nourished, and in no distress. Ambulates with cane. HENT: Head: Normocephalic and atraumatic.  Right Ear: External ear normal.  Left Ear: External ear normal.  Nose: Nose normal.  Mouth/Throat: Oropharynx is clear and moist. No oropharyngeal exudate.  Eyes: Conjunctivae and EOM are normal. Pupils are equal, round, and reactive to light. Right eye exhibits no discharge. Left eye exhibits no discharge. No scleral icterus.  Neck: Normal range of motion. Neck supple. No tracheal deviation present. No  thyromegaly present.  Cardiovascular: Normal rate, regular rhythm, normal heart sounds and intact distal pulses.  Exam reveals no gallop and no friction rub.   No murmur heard. Pacemaker  Pulmonary/Chest: Effort normal and breath sounds normal. He has no wheezes. He has no rales.  Abdominal: Soft. Bowel sounds are normal. He exhibits no distension and no mass. There is no tenderness. There is no rebound and no guarding.  Musculoskeletal: Normal range of motion. He exhibits no edema.  Lymphadenopathy:    He has no cervical adenopathy.  Neurological: He is alert and oriented to person, place, and time. He has normal reflexes. No cranial nerve deficit. Gait normal but slow.   Skin: Skin is warm and dry. No rash noted.  Psychiatric: Mood, memory, affect and judgment normal.  Nursing note and vitals reviewed.   LABORATORY DATA:  I have reviewed the data as listed.  CBC    Component Value Date/Time   WBC 4.6 02/08/2016 1149   RBC 3.36* 02/08/2016 1149   RBC 3.19* 03/13/2015 1245   HGB 10.2* 02/08/2016 1149   HCT 31.8* 02/08/2016 1149   PLT 201 02/08/2016 1149   MCV 94.6 02/08/2016 1149   MCH 30.4 02/08/2016 1149   MCHC 32.1 02/08/2016 1149   RDW 16.6* 02/08/2016 1149   LYMPHSABS 1.1 02/08/2016 1149   MONOABS 0.5 02/08/2016 1149   EOSABS 0.1 02/08/2016 1149   BASOSABS 0.0 02/08/2016 1149   CMP     Component Value Date/Time   NA 143 11/20/2015 0550   K 3.6 11/20/2015 0550   CL 100* 11/20/2015 0550   CO2 30 05/15/2015 1233   GLUCOSE 144* 11/20/2015 0550   BUN 24* 11/20/2015 0550   CREATININE 1.30* 11/20/2015 0550   CALCIUM 8.6* 05/15/2015 1233   PROT 7.5 05/15/2015 1233   ALBUMIN 3.5 05/15/2015 1233   AST 33 05/15/2015 1233   ALT 21  05/15/2015 1233   ALKPHOS 64 05/15/2015 1233   BILITOT 0.5 05/15/2015 1233   GFRNONAA 55* 05/15/2015 1233   GFRAA >60 05/15/2015 1233   Results for SIMMIE, CAMERER (MRN [id number])   Ref. Range 02/05/2015 15:21 03/13/2015 12:45 05/15/2015  12:33 06/28/2015 10:11 08/15/2015 11:00 11/14/2015 09:53 02/08/2016 11:50  Ferritin Latest Ref Range: 24-336 ng/mL 16 (L) 155 28 79 39 42 150   Results for DAOUDA, LONZO (MRN [id number]) as of 03/08/2016 16:03  Ref. Range 03/13/2015 12:45 05/15/2015 12:33 06/28/2015 10:10 08/15/2015 11:00 11/14/2015 09:53 11/20/2015 05:50 02/08/2016 11:49  Hemoglobin Latest Ref Range: 13.0-17.0 g/dL 9.5 (L) 10.4 (L) 11.1 (L) 10.7 (L) 10.7 (L) 10.9 (L) 10.2 (L)       ASSESSMENT & PLAN:  Anemia CKD, Stage III Fatigue Markedly elevated ESR, rheumatoid factor Severe iron deficiency with serum ferritin of 16 NG/ML Last GI evaluation approximately 10-15 years ago Polyclonal gammopathy, abnormal kappa lambda light chain ratio [place]  CBC is improved with ongoing iron replacement. Oral iron was insufficient to maintain adequate hematopoiesis. I advised his wife that the patient tolerates IV iron without difficulty. I would certainly recommend IV iron over transfusion long term.   He has seen Dr. Carlean Purl his GI physician in follow-up for his iron deficiency. The patient has currently declined a repeat C-scope.   I personally reviewed and went over laboratory studies with the patient. We will notify the patient of his irons levels and arrange for IV iron if needed.   He follows regularly in cardiology with Dr. Acie Fredrickson.  I spoke with the patient about physical therapy and how it could improve his leg weakness and gait. He is not interested in a referral at this time.   He will return for follow-up in 6 months with blood work every 3 months to keep an eye on his iron levels.  Orders Placed This Encounter  Procedures  . CBC with Differential    Standing Status: Standing     Number of Occurrences: 6     Standing Expiration Date: 02/07/2017  . Ferritin    Standing Status: Standing     Number of Occurrences: 6     Standing Expiration Date: 02/07/2017    All questions were answered. The patient knows to call the  clinic with any problems, questions or concerns.  This note was electronically signed.   This document serves as a record of services personally performed by Ancil Linsey, MD. It was created on her behalf by Arlyce Harman, a trained medical scribe. The creation of this record is based on the scribe's personal observations and the provider's statements to them. This document has been checked and approved by the attending provider.  I have reviewed the above documentation for accuracy and completeness, and I agree with the above.  Kelby Fam. Whitney Muse, MD

## 2016-02-13 ENCOUNTER — Ambulatory Visit (HOSPITAL_COMMUNITY): Payer: Medicare Other | Admitting: Hematology & Oncology

## 2016-02-13 ENCOUNTER — Other Ambulatory Visit (HOSPITAL_COMMUNITY): Payer: Medicare Other

## 2016-02-19 ENCOUNTER — Telehealth: Payer: Self-pay | Admitting: Cardiovascular Disease

## 2016-02-19 DIAGNOSIS — E538 Deficiency of other specified B group vitamins: Secondary | ICD-10-CM | POA: Diagnosis not present

## 2016-02-19 NOTE — Telephone Encounter (Signed)
Spoke with patient's wife who states she was advised to call to report how patient is doing after holding amlodipine for 2 weeks.  She reports patient says he does not feel any better but says he acts like he feels a little better.  She states he continues to have difficulty moving his feet; denies dizziness.  She states his feet just will not move like they are supposed to.  She has been monitoring his BP and states lowest systolic reading was [id number] mmHg several weeks ago and highest was 174/84 a few days ago.  She reports BP today is 169/86 mmHg.  She states her home BP cuff measures higher than the one at Dr. Danna Hefty office.  I advised her to change the batteries and make certain arm is resting on table or arm chair with the cuff at the same level when taking pressure.  I advised her to continue to monitor for 2 more weeks and call back to report.  She verbalized understanding and agreement with plan.

## 2016-02-19 NOTE — Telephone Encounter (Signed)
New message     The pt was instructed to call back and give a update.

## 2016-03-05 ENCOUNTER — Telehealth: Payer: Self-pay | Admitting: Cardiovascular Disease

## 2016-03-05 NOTE — Telephone Encounter (Signed)
Spoke with patient to discuss BP readings and he states his wife is on her way to an appointment with Dr. Dagmar Hait and is taking their BP cuff to calibrate since his readings are very different from last office visit.  I advised him that I do not think the readings he gave are the cause of his headaches.  He states he notices them more when he mows grass without wearing a facial mask.  He has temporarily stopped the amlodipine.  He asked me to call his wife, Dorian Pod, this afternoon to find out about the BP cuff calibration.  He thanked me for the call.

## 2016-03-05 NOTE — Telephone Encounter (Signed)
New message      Pt c/o BP issue: STAT if pt c/o blurred vision, one-sided weakness or slurred speech  1. What are your last 5 BP readings? 6-28 154/82; 6-30 147/75; 7-1 166/74; 7-2 159/86; 7-3 174/83; 7-4 158/87; 7-5 157/79; 7-6 156/78; 7-10 150/74; 7-11 176/92; 7-12 183/86 2. Are you having any other symptoms (ex. Dizziness, headache, blurred vision, passed out)? Have headaches every day 3. What is your BP issue? Calling to give bp readings

## 2016-03-06 MED ORDER — AMLODIPINE BESYLATE 2.5 MG PO TABS: 2.5000 mg | ORAL_TABLET | Freq: Every day | ORAL | Status: DC

## 2016-03-06 NOTE — Telephone Encounter (Signed)
Spoke with patient's wife, Dorian Pod, who states she took the BP cuff to Dr. Danna Hefty office and there was only a few points difference in the BP measurements with his manual cuff.  I advised her that I will review BP readings with Dr. Acie Fredrickson and call back with his advice.  She asked that I call her because her husband gets easily confused by names of medications and doctors.  I agreed to call back with Dr. Elmarie Shiley advice and she thanked me for the call.

## 2016-03-06 NOTE — Telephone Encounter (Signed)
Reviewed Dr. Elmarie Shiley advice with patient's wife, Dorian Pod.  She states patient has not had any dizziness, light-headedness or feeling like he is going to pass out.  She verbalized understanding and agreement to start Amlodipine 2.5 mg daily.  She thanked me for the call.

## 2016-03-06 NOTE — Telephone Encounter (Signed)
Will you see if his symptoms or orthostasis have improved.  Let restart Amlodipine 2.5 mg a day  ( a lower dose than what he was taking previously )  Have him continue to measure his BP

## 2016-03-08 ENCOUNTER — Encounter (HOSPITAL_COMMUNITY): Payer: Self-pay | Admitting: Hematology & Oncology

## 2016-03-28 DIAGNOSIS — N183 Chronic kidney disease, stage 3 (moderate): Secondary | ICD-10-CM | POA: Diagnosis not present

## 2016-03-28 DIAGNOSIS — E1122 Type 2 diabetes mellitus with diabetic chronic kidney disease: Secondary | ICD-10-CM | POA: Diagnosis not present

## 2016-03-28 DIAGNOSIS — Z9181 History of falling: Secondary | ICD-10-CM | POA: Diagnosis not present

## 2016-03-28 DIAGNOSIS — M069 Rheumatoid arthritis, unspecified: Secondary | ICD-10-CM | POA: Diagnosis not present

## 2016-03-28 DIAGNOSIS — R413 Other amnesia: Secondary | ICD-10-CM | POA: Diagnosis not present

## 2016-03-28 DIAGNOSIS — G4733 Obstructive sleep apnea (adult) (pediatric): Secondary | ICD-10-CM | POA: Diagnosis not present

## 2016-03-28 DIAGNOSIS — D649 Anemia, unspecified: Secondary | ICD-10-CM | POA: Diagnosis not present

## 2016-03-28 DIAGNOSIS — E538 Deficiency of other specified B group vitamins: Secondary | ICD-10-CM | POA: Diagnosis not present

## 2016-03-28 DIAGNOSIS — I10 Essential (primary) hypertension: Secondary | ICD-10-CM | POA: Diagnosis not present

## 2016-03-28 DIAGNOSIS — Z6830 Body mass index (BMI) 30.0-30.9, adult: Secondary | ICD-10-CM | POA: Diagnosis not present

## 2016-03-28 DIAGNOSIS — I480 Paroxysmal atrial fibrillation: Secondary | ICD-10-CM | POA: Diagnosis not present

## 2016-03-31 ENCOUNTER — Ambulatory Visit (INDEPENDENT_AMBULATORY_CARE_PROVIDER_SITE_OTHER): Payer: Medicare Other | Admitting: *Deleted

## 2016-03-31 DIAGNOSIS — I442 Atrioventricular block, complete: Secondary | ICD-10-CM

## 2016-03-31 NOTE — Progress Notes (Signed)
Remote pacemaker transmission.   

## 2016-04-02 ENCOUNTER — Encounter: Payer: Self-pay | Admitting: Cardiology

## 2016-04-03 LAB — CUP PACEART REMOTE DEVICE CHECK
Battery Impedance: 115 Ohm
Battery Voltage: 2.79 V
Brady Statistic AP VP Percent: 0 %
Brady Statistic AS VP Percent: 3 %
Date Time Interrogation Session: 20170807150618
Implantable Lead Location: 753859
Implantable Lead Model: 5076
Implantable Lead Model: 5092
Lead Channel Impedance Value: 450 Ohm
Lead Channel Pacing Threshold Amplitude: 0.5 V
Lead Channel Setting Pacing Pulse Width: 0.4 ms
MDC IDC LEAD IMPLANT DT: 20151010
MDC IDC LEAD IMPLANT DT: 20151010
MDC IDC LEAD LOCATION: 753860
MDC IDC MSMT BATTERY REMAINING LONGEVITY: 154 mo
MDC IDC MSMT LEADCHNL RA PACING THRESHOLD PULSEWIDTH: 0.4 ms
MDC IDC MSMT LEADCHNL RV IMPEDANCE VALUE: 842 Ohm
MDC IDC MSMT LEADCHNL RV PACING THRESHOLD AMPLITUDE: 0.625 V
MDC IDC MSMT LEADCHNL RV PACING THRESHOLD PULSEWIDTH: 0.4 ms
MDC IDC SET LEADCHNL RA PACING AMPLITUDE: 2 V
MDC IDC SET LEADCHNL RV PACING AMPLITUDE: 2.5 V
MDC IDC SET LEADCHNL RV SENSING SENSITIVITY: 5.6 mV
MDC IDC STAT BRADY AP VS PERCENT: 22 %
MDC IDC STAT BRADY AS VS PERCENT: 74 %

## 2016-05-05 ENCOUNTER — Emergency Department (HOSPITAL_COMMUNITY): Payer: Medicare Other

## 2016-05-05 ENCOUNTER — Inpatient Hospital Stay (HOSPITAL_COMMUNITY)
Admission: EM | Admit: 2016-05-05 | Discharge: 2016-05-08 | DRG: 065 | Disposition: A | Discharge status: Rehabilitation Facility | Payer: Medicare Other | Attending: Internal Medicine | Admitting: Internal Medicine

## 2016-05-05 ENCOUNTER — Encounter (HOSPITAL_COMMUNITY): Payer: Self-pay | Admitting: Radiology

## 2016-05-05 ENCOUNTER — Observation Stay (HOSPITAL_COMMUNITY): Payer: Medicare Other

## 2016-05-05 DIAGNOSIS — E669 Obesity, unspecified: Secondary | ICD-10-CM | POA: Diagnosis present

## 2016-05-05 DIAGNOSIS — D509 Iron deficiency anemia, unspecified: Secondary | ICD-10-CM | POA: Diagnosis present

## 2016-05-05 DIAGNOSIS — I4891 Unspecified atrial fibrillation: Secondary | ICD-10-CM | POA: Diagnosis not present

## 2016-05-05 DIAGNOSIS — Z6831 Body mass index (BMI) 31.0-31.9, adult: Secondary | ICD-10-CM

## 2016-05-05 DIAGNOSIS — R2990 Unspecified symptoms and signs involving the nervous system: Secondary | ICD-10-CM | POA: Diagnosis present

## 2016-05-05 DIAGNOSIS — IMO0002 Reserved for concepts with insufficient information to code with codable children: Secondary | ICD-10-CM

## 2016-05-05 DIAGNOSIS — I639 Cerebral infarction, unspecified: Secondary | ICD-10-CM

## 2016-05-05 DIAGNOSIS — I119 Hypertensive heart disease without heart failure: Secondary | ICD-10-CM | POA: Diagnosis present

## 2016-05-05 DIAGNOSIS — R4189 Other symptoms and signs involving cognitive functions and awareness: Secondary | ICD-10-CM

## 2016-05-05 DIAGNOSIS — E785 Hyperlipidemia, unspecified: Secondary | ICD-10-CM

## 2016-05-05 DIAGNOSIS — I69398 Other sequelae of cerebral infarction: Secondary | ICD-10-CM

## 2016-05-05 DIAGNOSIS — K219 Gastro-esophageal reflux disease without esophagitis: Secondary | ICD-10-CM | POA: Diagnosis present

## 2016-05-05 DIAGNOSIS — Z87891 Personal history of nicotine dependence: Secondary | ICD-10-CM

## 2016-05-05 DIAGNOSIS — D62 Acute posthemorrhagic anemia: Secondary | ICD-10-CM

## 2016-05-05 DIAGNOSIS — Z79899 Other long term (current) drug therapy: Secondary | ICD-10-CM

## 2016-05-05 DIAGNOSIS — E118 Type 2 diabetes mellitus with unspecified complications: Secondary | ICD-10-CM | POA: Insufficient documentation

## 2016-05-05 DIAGNOSIS — D500 Iron deficiency anemia secondary to blood loss (chronic): Secondary | ICD-10-CM | POA: Diagnosis present

## 2016-05-05 DIAGNOSIS — Z7901 Long term (current) use of anticoagulants: Secondary | ICD-10-CM

## 2016-05-05 DIAGNOSIS — Z888 Allergy status to other drugs, medicaments and biological substances status: Secondary | ICD-10-CM

## 2016-05-05 DIAGNOSIS — G4733 Obstructive sleep apnea (adult) (pediatric): Secondary | ICD-10-CM | POA: Diagnosis present

## 2016-05-05 DIAGNOSIS — E1165 Type 2 diabetes mellitus with hyperglycemia: Secondary | ICD-10-CM | POA: Diagnosis not present

## 2016-05-05 DIAGNOSIS — Z85828 Personal history of other malignant neoplasm of skin: Secondary | ICD-10-CM

## 2016-05-05 DIAGNOSIS — R404 Transient alteration of awareness: Secondary | ICD-10-CM | POA: Diagnosis not present

## 2016-05-05 DIAGNOSIS — Z7984 Long term (current) use of oral hypoglycemic drugs: Secondary | ICD-10-CM

## 2016-05-05 DIAGNOSIS — D649 Anemia, unspecified: Secondary | ICD-10-CM | POA: Diagnosis present

## 2016-05-05 DIAGNOSIS — I10 Essential (primary) hypertension: Secondary | ICD-10-CM | POA: Diagnosis present

## 2016-05-05 DIAGNOSIS — I69393 Ataxia following cerebral infarction: Secondary | ICD-10-CM

## 2016-05-05 DIAGNOSIS — Z885 Allergy status to narcotic agent status: Secondary | ICD-10-CM

## 2016-05-05 DIAGNOSIS — R079 Chest pain, unspecified: Secondary | ICD-10-CM | POA: Diagnosis not present

## 2016-05-05 DIAGNOSIS — R55 Syncope and collapse: Secondary | ICD-10-CM | POA: Diagnosis not present

## 2016-05-05 DIAGNOSIS — R42 Dizziness and giddiness: Secondary | ICD-10-CM | POA: Diagnosis not present

## 2016-05-05 DIAGNOSIS — M6281 Muscle weakness (generalized): Secondary | ICD-10-CM | POA: Diagnosis not present

## 2016-05-05 DIAGNOSIS — E538 Deficiency of other specified B group vitamins: Secondary | ICD-10-CM | POA: Diagnosis not present

## 2016-05-05 DIAGNOSIS — D638 Anemia in other chronic diseases classified elsewhere: Secondary | ICD-10-CM | POA: Diagnosis present

## 2016-05-05 DIAGNOSIS — F329 Major depressive disorder, single episode, unspecified: Secondary | ICD-10-CM | POA: Diagnosis present

## 2016-05-05 DIAGNOSIS — K449 Diaphragmatic hernia without obstruction or gangrene: Secondary | ICD-10-CM

## 2016-05-05 DIAGNOSIS — I951 Orthostatic hypotension: Secondary | ICD-10-CM | POA: Diagnosis present

## 2016-05-05 DIAGNOSIS — Z9841 Cataract extraction status, right eye: Secondary | ICD-10-CM

## 2016-05-05 DIAGNOSIS — R269 Unspecified abnormalities of gait and mobility: Secondary | ICD-10-CM

## 2016-05-05 DIAGNOSIS — Z9842 Cataract extraction status, left eye: Secondary | ICD-10-CM

## 2016-05-05 DIAGNOSIS — E119 Type 2 diabetes mellitus without complications: Secondary | ICD-10-CM

## 2016-05-05 DIAGNOSIS — I480 Paroxysmal atrial fibrillation: Secondary | ICD-10-CM | POA: Diagnosis not present

## 2016-05-05 DIAGNOSIS — Z9981 Dependence on supplemental oxygen: Secondary | ICD-10-CM

## 2016-05-05 DIAGNOSIS — D519 Vitamin B12 deficiency anemia, unspecified: Secondary | ICD-10-CM | POA: Diagnosis present

## 2016-05-05 DIAGNOSIS — Z823 Family history of stroke: Secondary | ICD-10-CM

## 2016-05-05 DIAGNOSIS — R531 Weakness: Secondary | ICD-10-CM

## 2016-05-05 DIAGNOSIS — I69319 Unspecified symptoms and signs involving cognitive functions following cerebral infarction: Secondary | ICD-10-CM

## 2016-05-05 DIAGNOSIS — I6330 Cerebral infarction due to thrombosis of unspecified cerebral artery: Principal | ICD-10-CM | POA: Diagnosis present

## 2016-05-05 DIAGNOSIS — I447 Left bundle-branch block, unspecified: Secondary | ICD-10-CM | POA: Diagnosis present

## 2016-05-05 DIAGNOSIS — Z950 Presence of cardiac pacemaker: Secondary | ICD-10-CM

## 2016-05-05 DIAGNOSIS — Z961 Presence of intraocular lens: Secondary | ICD-10-CM | POA: Diagnosis present

## 2016-05-05 LAB — CBC WITH DIFFERENTIAL/PLATELET
BASOS ABS: 0 10*3/uL (ref 0.0–0.1)
BASOS PCT: 1 %
EOS PCT: 2 %
Eosinophils Absolute: 0.1 10*3/uL (ref 0.0–0.7)
HCT: 33.2 % — ABNORMAL LOW (ref 39.0–52.0)
Hemoglobin: 10 g/dL — ABNORMAL LOW (ref 13.0–17.0)
Lymphocytes Relative: 15 %
Lymphs Abs: 0.8 10*3/uL (ref 0.7–4.0)
MCH: 29.5 pg (ref 26.0–34.0)
MCHC: 30.1 g/dL (ref 30.0–36.0)
MCV: 97.9 fL (ref 78.0–100.0)
MONO ABS: 0.3 10*3/uL (ref 0.1–1.0)
MONOS PCT: 6 %
Neutro Abs: 3.9 10*3/uL (ref 1.7–7.7)
Neutrophils Relative %: 76 %
PLATELETS: 145 10*3/uL — AB (ref 150–400)
RBC: 3.39 MIL/uL — ABNORMAL LOW (ref 4.22–5.81)
RDW: 16.1 % — AB (ref 11.5–15.5)
WBC: 5.2 10*3/uL (ref 4.0–10.5)

## 2016-05-05 LAB — URINALYSIS, ROUTINE W REFLEX MICROSCOPIC
BILIRUBIN URINE: NEGATIVE
GLUCOSE, UA: 250 mg/dL — AB
Ketones, ur: NEGATIVE mg/dL
Leukocytes, UA: NEGATIVE
Nitrite: NEGATIVE
PROTEIN: 100 mg/dL — AB
SPECIFIC GRAVITY, URINE: 1.025 (ref 1.005–1.030)
pH: 6 (ref 5.0–8.0)

## 2016-05-05 LAB — COMPREHENSIVE METABOLIC PANEL
ALBUMIN: 3.5 g/dL (ref 3.5–5.0)
ALK PHOS: 77 U/L (ref 38–126)
ALT: 22 U/L (ref 17–63)
AST: 32 U/L (ref 15–41)
Anion gap: 11 (ref 5–15)
BILIRUBIN TOTAL: 0.6 mg/dL (ref 0.3–1.2)
BUN: 13 mg/dL (ref 6–20)
CALCIUM: 8.9 mg/dL (ref 8.9–10.3)
CO2: 26 mmol/L (ref 22–32)
CREATININE: 1.04 mg/dL (ref 0.61–1.24)
Chloride: 103 mmol/L (ref 101–111)
GFR calc Af Amer: >60 mL/min (ref >60)
GFR calc non Af Amer: >60 mL/min (ref >60)
GLUCOSE: 188 mg/dL — AB (ref 65–99)
Potassium: 3.8 mmol/L (ref 3.5–5.1)
Sodium: 140 mmol/L (ref 135–145)
TOTAL PROTEIN: 7.3 g/dL (ref 6.5–8.1)

## 2016-05-05 LAB — APTT: APTT: 35 s (ref 24–36)

## 2016-05-05 LAB — URINE MICROSCOPIC-ADD ON: BACTERIA UA: NONE SEEN

## 2016-05-05 LAB — GLUCOSE, CAPILLARY: Glucose-Capillary: 155 mg/dL — ABNORMAL HIGH (ref 65–99)

## 2016-05-05 LAB — CBG MONITORING, ED
Glucose-Capillary: 177 mg/dL — ABNORMAL HIGH (ref 65–99)
Glucose-Capillary: 133 mg/dL — ABNORMAL HIGH (ref 65–99)

## 2016-05-05 LAB — TROPONIN I

## 2016-05-05 LAB — PROTIME-INR
INR: 1.22
PROTHROMBIN TIME: 15.5 s — AB (ref 11.4–15.2)

## 2016-05-05 LAB — I-STAT TROPONIN, ED: Troponin i, poc: 0 ng/mL (ref 0.00–0.08)

## 2016-05-05 MED ORDER — PANTOPRAZOLE SODIUM 40 MG PO TBEC
40.0000 mg | DELAYED_RELEASE_TABLET | Freq: Every day | ORAL | Status: DC
  Administered 2016-05-06 – 2016-05-08 (×3): 40 mg via ORAL
  Filled 2016-05-05 (×3): qty 1

## 2016-05-05 MED ORDER — SERTRALINE HCL 100 MG PO TABS
100.0000 mg | ORAL_TABLET | Freq: Every day | ORAL | Status: DC
  Administered 2016-05-06 – 2016-05-08 (×3): 100 mg via ORAL
  Filled 2016-05-05 (×3): qty 1

## 2016-05-05 MED ORDER — AMLODIPINE BESYLATE 2.5 MG PO TABS
2.5000 mg | ORAL_TABLET | Freq: Every day | ORAL | Status: DC
  Administered 2016-05-06: 2.5 mg via ORAL
  Filled 2016-05-05: qty 1

## 2016-05-05 MED ORDER — ONDANSETRON HCL 4 MG PO TABS: 4.0000 mg | ORAL_TABLET | Freq: Four times a day (QID) | ORAL | Status: DC | PRN

## 2016-05-05 MED ORDER — STROKE: EARLY STAGES OF RECOVERY BOOK: Freq: Once | Status: DC

## 2016-05-05 MED ORDER — AMLODIPINE BESYLATE 2.5 MG PO TABS
2.5000 mg | ORAL_TABLET | Freq: Once | ORAL | Status: AC
  Administered 2016-05-06: 2.5 mg via ORAL
  Filled 2016-05-05: qty 1

## 2016-05-05 MED ORDER — ONDANSETRON HCL 4 MG/2ML IJ SOLN
4.0000 mg | Freq: Once | INTRAMUSCULAR | Status: AC
  Administered 2016-05-05: 4 mg via INTRAVENOUS
  Filled 2016-05-05: qty 2

## 2016-05-05 MED ORDER — IOPAMIDOL (ISOVUE-370) INJECTION 76%
INTRAVENOUS | Status: AC
  Administered 2016-05-05: 75 mL
  Filled 2016-05-05: qty 100

## 2016-05-05 MED ORDER — STROKE: EARLY STAGES OF RECOVERY BOOK
Freq: Once | Status: DC
  Filled 2016-05-05: qty 1

## 2016-05-05 MED ORDER — HYDRALAZINE HCL 20 MG/ML IJ SOLN
5.0000 mg | Freq: Once | INTRAMUSCULAR | Status: AC
  Administered 2016-05-06: 5 mg via INTRAVENOUS
  Filled 2016-05-05: qty 1

## 2016-05-05 MED ORDER — METOCLOPRAMIDE HCL 5 MG/ML IJ SOLN
10.0000 mg | Freq: Once | INTRAMUSCULAR | Status: AC
  Administered 2016-05-05: 10 mg via INTRAVENOUS
  Filled 2016-05-05: qty 2

## 2016-05-05 MED ORDER — SODIUM CHLORIDE 0.9 % IV SOLN
INTRAVENOUS | Status: DC
  Administered 2016-05-05: 1000 mL via INTRAVENOUS
  Administered 2016-05-06 (×2): via INTRAVENOUS

## 2016-05-05 MED ORDER — AMLODIPINE BESYLATE 2.5 MG PO TABS: 2.5000 mg | ORAL_TABLET | Freq: Every day | ORAL | Status: DC

## 2016-05-05 MED ORDER — ACETAMINOPHEN 325 MG PO TABS
650.0000 mg | ORAL_TABLET | ORAL | Status: DC | PRN
  Administered 2016-05-05 – 2016-05-08 (×7): 650 mg via ORAL
  Filled 2016-05-05 (×7): qty 2

## 2016-05-05 MED ORDER — SERTRALINE HCL 100 MG PO TABS: 100.0000 mg | ORAL_TABLET | Freq: Every day | ORAL | Status: DC

## 2016-05-05 MED ORDER — AMIODARONE HCL 200 MG PO TABS
200.0000 mg | ORAL_TABLET | ORAL | Status: DC
  Administered 2016-05-07: 200 mg via ORAL
  Filled 2016-05-05: qty 1

## 2016-05-05 MED ORDER — ONDANSETRON HCL 4 MG/2ML IJ SOLN
4.0000 mg | Freq: Four times a day (QID) | INTRAMUSCULAR | Status: DC | PRN
  Administered 2016-05-05 – 2016-05-06 (×2): 4 mg via INTRAVENOUS
  Filled 2016-05-05 (×2): qty 2

## 2016-05-05 MED ORDER — MAGNESIUM CITRATE PO SOLN: 1.0000 | Freq: Once | ORAL | Status: DC | PRN

## 2016-05-05 MED ORDER — INSULIN ASPART 100 UNIT/ML ~~LOC~~ SOLN
0.0000 [IU] | Freq: Three times a day (TID) | SUBCUTANEOUS | Status: DC
  Administered 2016-05-06: 2 [IU] via SUBCUTANEOUS
  Administered 2016-05-06 – 2016-05-08 (×4): 1 [IU] via SUBCUTANEOUS
  Administered 2016-05-08: 2 [IU] via SUBCUTANEOUS

## 2016-05-05 MED ORDER — METOPROLOL TARTRATE 50 MG PO TABS: 50.0000 mg | ORAL_TABLET | Freq: Two times a day (BID) | ORAL | Status: DC

## 2016-05-05 MED ORDER — BENAZEPRIL HCL 20 MG PO TABS
40.0000 mg | ORAL_TABLET | Freq: Every day | ORAL | Status: DC
  Administered 2016-05-06: 40 mg via ORAL
  Filled 2016-05-05: qty 2

## 2016-05-05 MED ORDER — SENNOSIDES-DOCUSATE SODIUM 8.6-50 MG PO TABS
1.0000 | ORAL_TABLET | Freq: Every evening | ORAL | Status: DC | PRN
  Filled 2016-05-05: qty 1

## 2016-05-05 MED ORDER — SODIUM CHLORIDE 0.9 % IV BOLUS (SEPSIS)
1000.0000 mL | Freq: Once | INTRAVENOUS | Status: AC
  Administered 2016-05-05: 1000 mL via INTRAVENOUS

## 2016-05-05 MED ORDER — PANTOPRAZOLE SODIUM 40 MG PO TBEC: 40.0000 mg | DELAYED_RELEASE_TABLET | Freq: Every day | ORAL | Status: DC

## 2016-05-05 MED ORDER — BISACODYL 10 MG RE SUPP: 10.0000 mg | Freq: Every day | RECTAL | Status: DC | PRN

## 2016-05-05 MED ORDER — TRAZODONE HCL 50 MG PO TABS: 25.0000 mg | ORAL_TABLET | Freq: Every evening | ORAL | Status: DC | PRN

## 2016-05-05 MED ORDER — ACETAMINOPHEN 650 MG RE SUPP: 650.0000 mg | RECTAL | Status: DC | PRN

## 2016-05-05 MED ORDER — INSULIN ASPART 100 UNIT/ML ~~LOC~~ SOLN: 0.0000 [IU] | Freq: Three times a day (TID) | SUBCUTANEOUS | Status: DC

## 2016-05-05 MED ORDER — METOPROLOL TARTRATE 50 MG PO TABS
50.0000 mg | ORAL_TABLET | Freq: Two times a day (BID) | ORAL | Status: DC
  Administered 2016-05-06 – 2016-05-08 (×5): 50 mg via ORAL
  Filled 2016-05-05 (×5): qty 1

## 2016-05-05 MED ORDER — AMIODARONE HCL 200 MG PO TABS: 200.0000 mg | ORAL_TABLET | ORAL | Status: DC

## 2016-05-05 MED ORDER — RIVAROXABAN 20 MG PO TABS
20.0000 mg | ORAL_TABLET | Freq: Every day | ORAL | Status: DC
  Administered 2016-05-06 – 2016-05-08 (×3): 20 mg via ORAL
  Filled 2016-05-05 (×4): qty 1

## 2016-05-05 NOTE — ED Notes (Signed)
Notified CT administered another medication for nausea and currently ready for transport. Interrogated Medtronic device.

## 2016-05-05 NOTE — H&P (Signed)
History and Physical    Richard [name] [id number] DOB: Apr 07, 1931 DOA: 05/05/2016   PCP: Tivis Ringer, MD   Patient coming from:  Home    Chief Complaint:  Generalized weakness and dizziness   HPI: Richard [name] is a 82 y.o. male with medical history significant for HTN, HLD,  Afib on Xarelto, B-12 deficiency, Gerd, hiatal hernia, hyperlipidemia, hypertension, iron deficiency anemia followed and the cancer center, pacemaker placed due to CHB,orthostatic hypotension, OSA on CPAP,diabetes non-insulin-dependent, presenting to the emergency department with acute onset of dizziness and generalized weakness. He reports becoming acutely these yearly this morning when he was getting dressed, sets it down because he was unable to walk. He also reported without vomiting.he is unable to report dizziness versus lighteadedness dizziness. He denies syncope or fals. He denies any headaches or vision changes. He denies any on unilateral weakness chest pain,shortness of breath, abdominal pain, vomiting, blood in the stool or urine. He denies any resistant infections. He denies any new changes in his medications. No confusion is reported. No seizures.   ED Course:  BP 160/75   Pulse 73   Temp 97.1 F (36.2 C) (Oral)   Resp 20   SpO2 94%    CBC and CMET  within normal limits CT  of the head is negative for acute intracranial abnormalities MRI of the brain was unable to be performed as the patient has a pacemaker.   Review of Systems: As per HPI otherwise 10 point review of systems negative.   Past Medical History:  Diagnosis Date  . Anxiety   . Arthritis   . Atrial fibrillation ([place])    a. Dx 03/2013, notes report atrial fibrillation/atrial flutter, placed on amiodarone. NSR in subsequent OV's.  . B12 deficiency anemia   . Chest pain    a. H/o CTA negative for PE 2012, normal cath 2005, normal nucs previously including 05/2012.  Marland Kitchen Complete heart block (HCC)    a. s/p Medtronic Adapta L  model ADDRL 1 (serial number [id number]) pacemaker.  Marland Kitchen GERD (gastroesophageal reflux disease)   . Hiatal hernia   . Hyperlipidemia   . Hypertension   . Iron deficiency anemia   . LBBB (left bundle branch block)   . Microcytic anemia   . Nephrolithiasis   . On home oxygen therapy    a. 2L w/CPAP at night  . Orthostatic hypotension   . OSA on CPAP    cpap  . [place] spotted fever ~ 1945  . Sinus drainage   . Skin cancer of lip   . Type II diabetes mellitus ([facility])     Past Surgical History:  Procedure Laterality Date  . CARDIAC CATHETERIZATION     by Dr. Acie Fredrickson, January 24, 2004, that shows minimal coronary artery irregularities and normal left ventricular function  . CATARACT EXTRACTION W/ INTRAOCULAR LENS  IMPLANT, BILATERAL Bilateral   . ESOPHAGOGASTRODUODENOSCOPY (EGD) WITH PROPOFOL N/A 06/21/2015   Procedure: ESOPHAGOGASTRODUODENOSCOPY (EGD) WITH PROPOFOL;  Surgeon: Gatha Mayer, MD;  Location: [facility] ENDOSCOPY;  Service: Endoscopy;  Laterality: N/A;  . INGUINAL HERNIA REPAIR Right   . LUMBAR DISC SURGERY  ~ 1993  . PERMANENT PACEMAKER INSERTION N/A 06/03/2014   MDT Adapta L implanted by Dr Rayann Heman for syncope and transient AV block  . SKIN CANCER EXCISION     "lower lip" (04/06/2013)  . TEMPORARY PACEMAKER INSERTION N/A 05/31/2014   Procedure: TEMPORARY WIRE;  Surgeon: Sinclair Grooms, MD;  Location: [facility] CATH LAB;  Service: Cardiovascular;  Laterality: N/A;  . VASECTOMY     Hx of     Social History Social History   Social History  . Marital status: Married    Spouse name: N/A  . Number of children: 2  . Years of education: N/A   Occupational History  . retired    Social History Main Topics  . Smoking status: Former Smoker    Packs/day: 0.00    Years: 20.00    Types: Cigarettes  . Smokeless tobacco: Never Used     Comment: 04/06/2013 "quit smoking years ago; didn't smoke that much; probably 20 years; 1ppd"  . Alcohol use No  . Drug use: No  . Sexual  activity: No   Other Topics Concern  . Not on file   Social History Narrative  . No narrative on file     Allergies  Allergen Reactions  . Codeine Other (See Comments)    Hallucinations   . Citalopram Swelling and Rash    Family History  Problem Relation Age of Onset  . Other      Parents both died of old age; other conditions not known  . Stroke Brother   . Breast cancer Sister   . Pneumonia Father   . Thyroid disease Mother     goiter      Prior to Admission medications   Medication Sig Start Date End Date Taking? Authorizing Provider  acetaminophen (TYLENOL) 500 MG tablet Take 500 mg by mouth 2 (two) times daily.    Yes Historical Provider, MD  amiodarone (PACERONE) 200 MG tablet Take 200 mg by mouth 3 (three) times a week. On Monday, Wednesday & Friday   Yes Historical Provider, MD  amLODipine (NORVASC) 2.5 MG tablet Take 1 tablet (2.5 mg total) by mouth daily. 03/06/16  Yes Thayer Headings, MD  benazepril (LOTENSIN) 40 MG tablet Take 40 mg by mouth daily.     Yes Historical Provider, MD  cyanocobalamin (,VITAMIN B-12,) 1000 MCG/ML injection Inject 1,000 mcg into the muscle every 30 (thirty) days.   Yes Historical Provider, MD  diazepam (VALIUM) 5 MG tablet Take 5 mg by mouth at bedtime.     Yes Historical Provider, MD  fluticasone (FLONASE) 50 MCG/ACT nasal spray Place 1 spray into the nose daily as needed for allergies or rhinitis.    Yes Historical Provider, MD  metFORMIN (GLUCOPHAGE) 1000 MG tablet Take 1,000 mg by mouth 2 (two) times daily with a meal.   Yes Historical Provider, MD  metoprolol (LOPRESSOR) 50 MG tablet TAKE 1 TABLET BY MOUTH TWICE A DAY 07/06/15  Yes Thayer Headings, MD  omeprazole (PRILOSEC) 20 MG capsule Take 20 mg by mouth daily.    Yes Historical Provider, MD  OXYGEN Inhale into the lungs at bedtime. CPAP with OXYGEN   Yes Historical Provider, MD  pioglitazone (ACTOS) 30 MG tablet Take 30 mg by mouth daily.     Yes Historical Provider, MD    sertraline (ZOLOFT) 100 MG tablet Take 1 tablet by mouth daily. 08/21/14  Yes Historical Provider, MD  XARELTO 20 MG TABS tablet TAKE 1 TABLET (20 MG TOTAL) BY MOUTH DAILY WITH SUPPER. 07/23/15  Yes Thayer Headings, MD    Physical Exam:    Vitals:   05/05/16 1345 05/05/16 1400 05/05/16 1415 05/05/16 1445  BP: 163/80 176/81 170/96 160/75  Pulse: 75 74 74 73  Resp: 19 22 20 20   Temp:      TempSrc:  SpO2: 93% 93% 93% 94%       Constitutional: NAD, calm, comfortable   Vitals:   05/05/16 1345 05/05/16 1400 05/05/16 1415 05/05/16 1445  BP: 163/80 176/81 170/96 160/75  Pulse: 75 74 74 73  Resp: 19 22 20 20   Temp:      TempSrc:      SpO2: 93% 93% 93% 94%   Eyes: PERRL, lids and conjunctivae normal ENMT: Mucous membranes are moist. Posterior pharynx clear of any exudate or lesions.Normal dentition.  Neck: normal, supple, no masses, no thyromegaly Respiratory: clear to auscultation bilaterally, no wheezing, no crackles. Normal respiratory effort. No accessory muscle use.  Cardiovascular:  PAced Regular rate and rhythm, no murmurs / rubs / gallops. No extremity edema. 2+ pedal pulses. No carotid bruits.  Abdomen: no tenderness, no masses palpated. No hepatosplenomegaly. Bowel sounds positive.  Musculoskeletal: no clubbing / cyanosis. No joint deformity upper and lower extremities. Good ROM, no contractures. Normal muscle tone.  Skin: no rashes, lesions, ulcers. Dry  Neurologic: CN 2-12 grossly intact. Sensation intact, DTR normal. Strength 5/5 in all 4.  Psychiatric: Normal judgment and insight. Alert and oriented x 3. Normal mood.     Labs on Admission: I have personally reviewed following labs and imaging studies  CBC:  Recent Labs Lab 05/05/16 1035  WBC 5.2  NEUTROABS 3.9  HGB 10.0*  HCT 33.2*  MCV 97.9  PLT 145*    Basic Metabolic Panel:  Recent Labs Lab 05/05/16 1035  NA 140  K 3.8  CL 103  CO2 26  GLUCOSE 188*  BUN 13  CREATININE 1.04  CALCIUM  8.9    GFR: CrCl cannot be calculated (Unknown ideal weight.).  Liver Function Tests:  Recent Labs Lab 05/05/16 1035  AST 32  ALT 22  ALKPHOS 77  BILITOT 0.6  PROT 7.3  ALBUMIN 3.5   No results for input(s): LIPASE, AMYLASE in the last 168 hours. No results for input(s): AMMONIA in the last 168 hours.  Coagulation Profile: No results for input(s): INR, PROTIME in the last 168 hours.  Cardiac Enzymes: No results for input(s): CKTOTAL, CKMB, CKMBINDEX, TROPONINI in the last 168 hours.  BNP (last 3 results) No results for input(s): PROBNP in the last 8760 hours.  HbA1C: No results for input(s): HGBA1C in the last 72 hours.  CBG:  Recent Labs Lab 05/05/16 1041  GLUCAP 177*    Lipid Profile: No results for input(s): CHOL, HDL, LDLCALC, TRIG, CHOLHDL, LDLDIRECT in the last 72 hours.  Thyroid Function Tests: No results for input(s): TSH, T4TOTAL, FREET4, T3FREE, THYROIDAB in the last 72 hours.  Anemia Panel: No results for input(s): VITAMINB12, FOLATE, FERRITIN, TIBC, IRON, RETICCTPCT in the last 72 hours.  Urine analysis:    Component Value Date/Time   COLORURINE YELLOW 05/05/2016 1118   APPEARANCEUR CLEAR 05/05/2016 1118   LABSPEC 1.025 05/05/2016 1118   PHURINE 6.0 05/05/2016 1118   GLUCOSEU 250 (A) 05/05/2016 1118   HGBUR MODERATE (A) 05/05/2016 1118   BILIRUBINUR NEGATIVE 05/05/2016 1118   KETONESUR NEGATIVE 05/05/2016 1118   PROTEINUR 100 (A) 05/05/2016 1118   UROBILINOGEN 0.2 03/30/2014 1400   NITRITE NEGATIVE 05/05/2016 1118   LEUKOCYTESUR NEGATIVE 05/05/2016 1118    Sepsis Labs: @LABRCNTIP (procalcitonin:4,lacticidven:4) )No results found for this or any previous visit (from the past 240 hour(s)).   Radiological Exams on Admission: Dg Chest 2 View  Result Date: 05/05/2016 CLINICAL DATA:  Weakness, dizziness and left-sided chest pain. EXAM: CHEST  2 VIEW COMPARISON:  06/04/2014  FINDINGS: The pacer wires are stable. The heart is enlarged but  unchanged. There is marked tortuosity, ectasia and calcification of the thoracic aorta which appears stable. Mild chronic bronchitic changes with suspected superimposed interstitial pulmonary edema with peribronchial thickening and increased interseptal lines peripherally (Kerley B-lines) small bilateral pleural effusions. Stable hiatal hernia. IMPRESSION: 1. Cardiac enlargement, interstitial edema and small effusions suggesting CHF. 2. Stable large hiatal hernia. 3. Stable right atrial and right ventricular pacer wires. Electronically Signed   By: Marijo Sanes M.D.   On: 05/05/2016 11:12   Ct Head Wo Contrast  Result Date: 05/05/2016 CLINICAL DATA:  82 year old male with syncope, severe generalized weakness nausea and dizziness. Initial encounter. EXAM: CT HEAD WITHOUT CONTRAST TECHNIQUE: Contiguous axial images were obtained from the base of the skull through the vertex without intravenous contrast. COMPARISON:  Head CT without contrast 05/31/2014. Brain MRI 05/18/2013. FINDINGS: Brain: Chronic confluent cerebral white matter hypodensity bilaterally. New heterogeneity in the region of the left thalamus since 2015. No acute or chronic cortically based infarct identified. No midline shift, mass effect, or evidence of intracranial mass lesion. No ventriculomegaly. No acute intracranial hemorrhage identified. Vascular: Calcified atherosclerosis at the skull base. A degree of chronic intracranial artery dolichoectasia is again noted. No suspicious intracranial vascular hyperdensity. Skull: No acute osseous abnormality identified. Sinuses/Orbits: Visualized paranasal sinuses and mastoids are stable and well pneumatized. Other: No acute orbit or scalp soft tissue findings. IMPRESSION: 1. Evidence of progressed chronic small vessel disease in the left thalamus since 2015. Underlying chronic white matter disease. 2.  No acute intracranial abnormality identified. Electronically Signed   By: Genevie Ann M.D.   On:  05/05/2016 12:46    EKG: Independently reviewed.  Assessment/Plan Active Problems:   HTN (hypertension)   Sleep apnea   Diabetes mellitus (HCC)   Hiatal hernia   Atrial fibrillation (HCC)   Anemia   GERD (gastroesophageal reflux disease)   Orthostatic hypotension   B12 deficiency anemia   Anemia, iron deficiency   Dizziness with exertion with generalized weakness in a patient with atrial fibrillation,anemia, history of orthostatic hypotension. Rule out vertigo, orthostatic hypotension, and deconditioning Patient is nauseous, weak, and was unable to stand due to gait instability. No unilateral weakness.  CT head without acute intracranial abnormalities. MRI unable to be performed as patient h/o PMP. Willl proceed with stroke workup - Telemetry, observation Stroke order set  - Meclizine when necessary dizziness/ Zofran for nausea - Neurology consult if not improving in a.m. - orthostatics    2D echo as last performed on 05/2014    EKG in am   CT angio head and neck Allow permissive HTN -PT/OT/SLP  lipid panel  Check A1C Continue Xarelto Hold lopressor Add Hydralazine Q6 hours as needed for BP 210/110   Hypertension BP 160/75   Pulse 73    Continue home anti-hypertensive medications tomorrow, currently on hold as above  Add Hydralazine Q6 hours as needed for BP 160/90    Atrial Fibrillation   on anticoagulation with Xarelto  Rate controlled -Continue meds   Type II Diabetes Current blood sugar level is 177 No results found for: HGBA1C Hgb A1C Hold home oral diabetic medications.   SSI Heart healthy carb modified diet after passing SLP   Anemia of chronic disease Hemoglobin  on admission in the 10s, unchanged  No bleeding issues Can be followed up by dr. Whitney Muse    Depression Continue Zoloft   DVT prophylaxis: Xarelto Code Status:   Full  Family Communication:  Discussed with patient wife  Disposition Plan: Expect patient to be discharged to home after  condition improves Consults called:    None Admission status:Tele  Obs    Shelia Magallon E, PA-C [facility]   If 7PM-7AM, please contact night-coverage [email or web address] Password [email or web address]  05/05/2016, 4:12 PM

## 2016-05-05 NOTE — ED Notes (Signed)
Attempted to ambulate patient states feels to weak to walk.

## 2016-05-05 NOTE — ED Notes (Signed)
Pt had multiple episodes of emesis while taking Orthostatic VS

## 2016-05-05 NOTE — ED Notes (Signed)
Provider at bedside. Patient returned from CT unable to perform due to patient stating feeling of nausea.

## 2016-05-05 NOTE — ED Notes (Signed)
Patient states feels better however when patient moves patient has nausea. Unable to perform stroke swallow screen.

## 2016-05-05 NOTE — ED Triage Notes (Signed)
Pt with acute onset dizziness at 0830 while dressing.

## 2016-05-05 NOTE — ED Provider Notes (Signed)
[facility] DEPT Provider Note   CSN: [id number] Arrival date & time: 05/05/16  1022     History   Chief Complaint Chief Complaint  Patient presents with  . Dizziness  . Weakness    HPI Richard [name] is a 82 y.o. male who presents with acute onset of dizziness and generalized weakness. Past medical history significant for A. Fib on Eliquis, complete heart block status post Medtronic pacemaker, hypertension, hyperlipidemia, anemia on iron infusions, diabetes, history of orthostatic hypotension. He states that he became acutely dizzy at 8:30AM this morning when he was getting dressed. He had to sit down because he was unable to walk. Reports associated nausea and "gagging". He is unable to differentiate dizziness from lightheadedness saying he feels both. Denies syncope, headache, tinnitus, vision changes, unilateral weakness, chest pain, shortness of breath, abdominal pain, vomiting, blood in his stool or urine. He has never had this feeling before.  HPI  Past Medical History:  Diagnosis Date  . Anxiety   . Arthritis   . Atrial fibrillation ([place])    a. Dx 03/2013, notes report atrial fibrillation/atrial flutter, placed on amiodarone. NSR in subsequent OV's.  . B12 deficiency anemia   . Chest pain    a. H/o CTA negative for PE 2012, normal cath 2005, normal nucs previously including 05/2012.  Marland Kitchen Complete heart block (HCC)    a. s/p Medtronic Adapta L model ADDRL 1 (serial number [id number]) pacemaker.  Marland Kitchen GERD (gastroesophageal reflux disease)   . Hiatal hernia   . Hyperlipidemia   . Hypertension   . Iron deficiency anemia   . LBBB (left bundle branch block)   . Microcytic anemia   . Nephrolithiasis   . On home oxygen therapy    a. 2L w/CPAP at night  . Orthostatic hypotension   . OSA on CPAP    cpap  . [place] spotted fever ~ 1945  . Sinus drainage   . Skin cancer of lip   . Type II diabetes mellitus [facility])     Patient Active Problem List   Diagnosis Date  Noted  . Absolute anemia 02/08/2016  . Weakness 02/01/2016  . Gastric polyp 06/21/2015  . Anemia, iron deficiency   . GERD (gastroesophageal reflux disease)   . Orthostatic hypotension   . B12 deficiency anemia   . Hypertensive cardiovascular disease   . Bradycardia with less than 30 beats per minute 05/31/2014  . Complete heart block ([employer]) 05/31/2014  . Syncope 05/31/2014  . Lactic acidosis 05/31/2014  . AKI (acute kidney injury) ([place]) 05/31/2014  . Anemia 05/31/2014  . Atrial fibrillation ([place]) 04/08/2013  . Diabetes mellitus ([place]) 04/06/2013  . Hiatal hernia 04/06/2013  . Chest pain 06/13/2011  . HTN (hypertension) 06/13/2011  . Sleep apnea 06/13/2011    Past Surgical History:  Procedure Laterality Date  . CARDIAC CATHETERIZATION     by Dr. Acie Fredrickson, January 24, 2004, that shows minimal coronary artery irregularities and normal left ventricular function  . CATARACT EXTRACTION W/ INTRAOCULAR LENS  IMPLANT, BILATERAL Bilateral   . ESOPHAGOGASTRODUODENOSCOPY (EGD) WITH PROPOFOL N/A 06/21/2015   Procedure: ESOPHAGOGASTRODUODENOSCOPY (EGD) WITH PROPOFOL;  Surgeon: Gatha Mayer, MD;  Location: [facility] ENDOSCOPY;  Service: Endoscopy;  Laterality: N/A;  . INGUINAL HERNIA REPAIR Right   . LUMBAR DISC SURGERY  ~ 1993  . PERMANENT PACEMAKER INSERTION N/A 06/03/2014   MDT Adapta L implanted by Dr Rayann Heman for syncope and transient AV block  . SKIN CANCER EXCISION     "lower  lip" (04/06/2013)  . TEMPORARY PACEMAKER INSERTION N/A 05/31/2014   Procedure: TEMPORARY WIRE;  Surgeon: Sinclair Grooms, MD;  Location: [facility] CATH LAB;  Service: Cardiovascular;  Laterality: N/A;  . VASECTOMY     Hx of     OB History    No data available       Home Medications    Prior to Admission medications   Medication Sig Start Date End Date Taking? Authorizing Provider  acetaminophen (TYLENOL) 500 MG tablet Take 500 mg by mouth every 6 (six) hours as needed for mild pain.    Historical Provider, MD  amiodarone  (PACERONE) 200 MG tablet Take 200 mg by mouth 3 (three) times a week. On Monday, Wednesday & Friday    Historical Provider, MD  amLODipine (NORVASC) 2.5 MG tablet Take 1 tablet (2.5 mg total) by mouth daily. 03/06/16   Thayer Headings, MD  benazepril (LOTENSIN) 40 MG tablet Take 40 mg by mouth daily.      Historical Provider, MD  cyanocobalamin (,VITAMIN B-12,) 1000 MCG/ML injection Inject 1,000 mcg into the muscle every 30 (thirty) days.    Historical Provider, MD  diazepam (VALIUM) 5 MG tablet Take 5 mg by mouth at bedtime.      Historical Provider, MD  fluticasone (FLONASE) 50 MCG/ACT nasal spray Place 1 spray into the nose daily as needed for allergies or rhinitis.     Historical Provider, MD  metFORMIN (GLUCOPHAGE) 1000 MG tablet Take 1,000 mg by mouth 2 (two) times daily with a meal.    Historical Provider, MD  metoprolol (LOPRESSOR) 50 MG tablet TAKE 1 TABLET BY MOUTH TWICE A DAY 07/06/15   Thayer Headings, MD  omeprazole (PRILOSEC) 20 MG capsule Take 20 mg by mouth daily.     Historical Provider, MD  OXYGEN Inhale into the lungs at bedtime. CPAP with OXYGEN    Historical Provider, MD  pioglitazone (ACTOS) 30 MG tablet Take 30 mg by mouth daily.      Historical Provider, MD  sertraline (ZOLOFT) 100 MG tablet Take 1 tablet by mouth daily. 08/21/14   Historical Provider, MD  XARELTO 20 MG TABS tablet TAKE 1 TABLET (20 MG TOTAL) BY MOUTH DAILY WITH SUPPER. 07/23/15   Thayer Headings, MD    Family History Family History  Problem Relation Age of Onset  . Other      Parents both died of old age; other conditions not known  . Stroke Brother   . Breast cancer Sister   . Pneumonia Father   . Thyroid disease Mother     goiter    Social History Social History  Substance Use Topics  . Smoking status: Former Smoker    Packs/day: 0.00    Years: 20.00    Types: Cigarettes  . Smokeless tobacco: Never Used     Comment: 04/06/2013 "quit smoking years ago; didn't smoke that much; probably 20  years; 1ppd"  . Alcohol use No     Allergies   Codeine and Citalopram   Review of Systems Review of Systems  Constitutional: Positive for fatigue. Negative for chills and fever.  Eyes: Negative for visual disturbance.  Respiratory: Negative for shortness of breath.   Cardiovascular: Negative for chest pain.  Gastrointestinal: Positive for nausea. Negative for abdominal pain, blood in stool and vomiting.  Neurological: Positive for weakness. Negative for syncope.     Physical Exam Updated Vital Signs BP 195/83 (BP Location: Right Arm)   Pulse 63   Temp  97.1 F (36.2 C) (Oral)   Resp 20   SpO2 92%   Physical Exam  Constitutional: He is oriented to person, place, and time. He appears well-developed and well-nourished. No distress.  Baseline tremor  HENT:  Head: Normocephalic and atraumatic.  Right Ear: Tympanic membrane, external ear and ear canal normal.  Left Ear: Tympanic membrane, external ear and ear canal normal.  Hard of hearing. Small amount of non-obstructive wax on right side. TMs appear to be scarred bilaterally  Eyes: Conjunctivae are normal. Pupils are equal, round, and reactive to light. Right eye exhibits no discharge. Left eye exhibits no discharge. No scleral icterus.  Pale conjunctiva  Neck: Normal range of motion. Neck supple.  Cardiovascular: Normal rate, regular rhythm and intact distal pulses.  Exam reveals no gallop and no friction rub.   No murmur heard. Pulmonary/Chest: Effort normal and breath sounds normal. No respiratory distress. He has no wheezes. He has no rales. He exhibits no tenderness.  Abdominal: Soft. He exhibits no distension. There is no tenderness.  Musculoskeletal: He exhibits no edema.  Neurological: He is alert and oriented to person, place, and time.  Mental Status:  Alert, oriented, thought content appropriate, able to give a coherent history. Speech fluent without evidence of aphasia. Able to follow 2 step commands without  difficulty.  Cranial Nerves:  II:  Peripheral visual fields grossly normal, pupils equal, round, reactive to light III,IV, VI: ptosis not present, extra-ocular motions intact bilaterally  V,VII: smile symmetric, facial light touch sensation equal VIII: hearing grossly normal to voice  X: uvula elevates symmetrically  XI: bilateral shoulder shrug symmetric and strong XII: midline tongue extension without fassiculations Motor:  Normal tone. 5/5 in upper and lower extremities bilaterally including strong and equal grip strength and dorsiflexion/plantar flexion Sensory: Pinprick and light touch normal in all extremities.  Deep Tendon Reflexes: 2+ and symmetric in the biceps and patella Cerebellar: normal finger-to-nose with bilateral upper extremities.  Gait: Unable to perform- patient states he is too dizzy and weak CV: distal pulses palpable throughout      Skin: Skin is warm and dry.  Psychiatric: He has a normal mood and affect.  Nursing note and vitals reviewed.    ED Treatments / Results  Labs (all labs ordered are listed, but only abnormal results are displayed) Labs Reviewed  COMPREHENSIVE METABOLIC PANEL - Abnormal; Notable for the following:       Result Value   Glucose, Bld 188 (*)    All other components within normal limits  CBC WITH DIFFERENTIAL/PLATELET - Abnormal; Notable for the following:    RBC 3.39 (*)    Hemoglobin 10.0 (*)    HCT 33.2 (*)    RDW 16.1 (*)    Platelets 145 (*)    All other components within normal limits  URINALYSIS, ROUTINE W REFLEX MICROSCOPIC (NOT AT [facility]) - Abnormal; Notable for the following:    Glucose, UA 250 (*)    Hgb urine dipstick MODERATE (*)    Protein, ur 100 (*)    All other components within normal limits  URINE MICROSCOPIC-ADD ON - Abnormal; Notable for the following:    Squamous Epithelial / LPF 0-5 (*)    All other components within normal limits  CBG MONITORING, ED - Abnormal; Notable for the following:     Glucose-Capillary 177 (*)    All other components within normal limits  CBG MONITORING, ED - Abnormal; Notable for the following:    Glucose-Capillary 133 (*)  All other components within normal limits  HEMOGLOBIN A1C  PROTIME-INR  APTT  TROPONIN I  URINE RAPID DRUG SCREEN, HOSP PERFORMED  HEMOGLOBIN A1C  LIPID PANEL  I-STAT TROPOININ, ED    EKG  EKG Interpretation None       Radiology Dg Chest 2 View  Result Date: 05/05/2016 CLINICAL DATA:  Weakness, dizziness and left-sided chest pain. EXAM: CHEST  2 VIEW COMPARISON:  06/04/2014 FINDINGS: The pacer wires are stable. The heart is enlarged but unchanged. There is marked tortuosity, ectasia and calcification of the thoracic aorta which appears stable. Mild chronic bronchitic changes with suspected superimposed interstitial pulmonary edema with peribronchial thickening and increased interseptal lines peripherally (Kerley B-lines) small bilateral pleural effusions. Stable hiatal hernia. IMPRESSION: 1. Cardiac enlargement, interstitial edema and small effusions suggesting CHF. 2. Stable large hiatal hernia. 3. Stable right atrial and right ventricular pacer wires. Electronically Signed   By: Marijo Sanes M.D.   On: 05/05/2016 11:12   Ct Head Wo Contrast  Result Date: 05/05/2016 CLINICAL DATA:  82 year old male with syncope, severe generalized weakness nausea and dizziness. Initial encounter. EXAM: CT HEAD WITHOUT CONTRAST TECHNIQUE: Contiguous axial images were obtained from the base of the skull through the vertex without intravenous contrast. COMPARISON:  Head CT without contrast 05/31/2014. Brain MRI 05/18/2013. FINDINGS: Brain: Chronic confluent cerebral white matter hypodensity bilaterally. New heterogeneity in the region of the left thalamus since 2015. No acute or chronic cortically based infarct identified. No midline shift, mass effect, or evidence of intracranial mass lesion. No ventriculomegaly. No acute intracranial hemorrhage  identified. Vascular: Calcified atherosclerosis at the skull base. A degree of chronic intracranial artery dolichoectasia is again noted. No suspicious intracranial vascular hyperdensity. Skull: No acute osseous abnormality identified. Sinuses/Orbits: Visualized paranasal sinuses and mastoids are stable and well pneumatized. Other: No acute orbit or scalp soft tissue findings. IMPRESSION: 1. Evidence of progressed chronic small vessel disease in the left thalamus since 2015. Underlying chronic white matter disease. 2.  No acute intracranial abnormality identified. Electronically Signed   By: Genevie Ann M.D.   On: 05/05/2016 12:46    Procedures Procedures (including critical care time)  Medications Ordered in ED Medications  amLODipine (NORVASC) tablet 2.5 mg (not administered)  amiodarone (PACERONE) tablet 200 mg (not administered)  rivaroxaban (XARELTO) tablet 20 mg (not administered)  metoprolol tartrate (LOPRESSOR) tablet 50 mg (not administered)  sertraline (ZOLOFT) tablet 100 mg (not administered)  benazepril (LOTENSIN) tablet 40 mg (not administered)  pantoprazole (PROTONIX) EC tablet 40 mg (not administered)   stroke: mapping our early stages of recovery book (not administered)  0.9 %  sodium chloride infusion (not administered)  acetaminophen (TYLENOL) tablet 650 mg (not administered)    Or  acetaminophen (TYLENOL) suppository 650 mg (not administered)  insulin aspart (novoLOG) injection 0-9 Units (not administered)  traZODone (DESYREL) tablet 25 mg (not administered)  senna-docusate (Senokot-S) tablet 1 tablet (not administered)  bisacodyl (DULCOLAX) suppository 10 mg (not administered)  magnesium citrate solution 1 Bottle (not administered)  ondansetron (ZOFRAN) tablet 4 mg (not administered)    Or  ondansetron (ZOFRAN) injection 4 mg (not administered)  ondansetron (ZOFRAN) injection 4 mg (4 mg Intravenous Given 05/05/16 1037)  metoCLOPramide (REGLAN) injection 10 mg (10 mg  Intravenous Given 05/05/16 1128)  sodium chloride 0.9 % bolus 1,000 mL (0 mLs Intravenous Stopped 05/05/16 1230)     Initial Impression / Assessment and Plan / ED Course  I have reviewed the triage vital signs and the nursing notes.  Pertinent  labs & imaging results that were available during my care of the patient were reviewed by me and considered in my medical decision making (see chart for details).  Clinical Course   82 year old male presents with acute onset of dizziness/weakness. Unclear etiology as work up is overall unremarkable. Possibly vertigo. Patient is afebrile, not tachycardic or tachypneic, and not hypoxic. He is hypertensive - has hx of HTN. CBC remarkable for anemia which appears stable. CMP remarkable for hyperglycemia. UA remarkable for glucose of 250, moderate hgb, protein 100. Zofran and fluids started. On recheck patient is still dry heaving, reglan given.   EKG is SR with LBBB. Troponin is normal. CT head is overall unremarkable. CXR is negative. MRI unable to be performed due to pacemaker. Pacemaker was interrogated and was unremarkable.   Several rechecks performed without improvement in symptoms. Due to patients inability to ambulate in the ED, will admit for acute weakness and dizziness. Spoke with Providence Crosby PA-C who will come to see.  Final Clinical Impressions(s) / ED Diagnoses   Final diagnoses:  Generalized weakness  Dizziness    New Prescriptions New Prescriptions   No medications on file     Recardo Evangelist, PA-C 05/05/16 [place], MD 05/06/16 [phone]

## 2016-05-05 NOTE — ED Notes (Signed)
Pt has passed swallow screen. Pt extremely dizzy/nauseated when standing. Family requesting to speak with MD prior to MRI. Concerns re: pacemaker.

## 2016-05-06 ENCOUNTER — Observation Stay (HOSPITAL_BASED_OUTPATIENT_CLINIC_OR_DEPARTMENT_OTHER): Payer: Medicare Other

## 2016-05-06 ENCOUNTER — Encounter (HOSPITAL_COMMUNITY): Payer: Self-pay | Admitting: *Deleted

## 2016-05-06 DIAGNOSIS — E538 Deficiency of other specified B group vitamins: Secondary | ICD-10-CM | POA: Diagnosis present

## 2016-05-06 DIAGNOSIS — K5901 Slow transit constipation: Secondary | ICD-10-CM | POA: Diagnosis not present

## 2016-05-06 DIAGNOSIS — I639 Cerebral infarction, unspecified: Secondary | ICD-10-CM | POA: Diagnosis not present

## 2016-05-06 DIAGNOSIS — E785 Hyperlipidemia, unspecified: Secondary | ICD-10-CM | POA: Diagnosis present

## 2016-05-06 DIAGNOSIS — I4891 Unspecified atrial fibrillation: Secondary | ICD-10-CM | POA: Diagnosis present

## 2016-05-06 DIAGNOSIS — Z87891 Personal history of nicotine dependence: Secondary | ICD-10-CM | POA: Diagnosis not present

## 2016-05-06 DIAGNOSIS — E1165 Type 2 diabetes mellitus with hyperglycemia: Secondary | ICD-10-CM | POA: Diagnosis present

## 2016-05-06 DIAGNOSIS — Z9842 Cataract extraction status, left eye: Secondary | ICD-10-CM | POA: Diagnosis not present

## 2016-05-06 DIAGNOSIS — I480 Paroxysmal atrial fibrillation: Secondary | ICD-10-CM | POA: Diagnosis not present

## 2016-05-06 DIAGNOSIS — I63441 Cerebral infarction due to embolism of right cerebellar artery: Secondary | ICD-10-CM | POA: Diagnosis not present

## 2016-05-06 DIAGNOSIS — G4733 Obstructive sleep apnea (adult) (pediatric): Secondary | ICD-10-CM | POA: Diagnosis present

## 2016-05-06 DIAGNOSIS — E1159 Type 2 diabetes mellitus with other circulatory complications: Secondary | ICD-10-CM | POA: Diagnosis not present

## 2016-05-06 DIAGNOSIS — I6330 Cerebral infarction due to thrombosis of unspecified cerebral artery: Secondary | ICD-10-CM | POA: Diagnosis present

## 2016-05-06 DIAGNOSIS — K219 Gastro-esophageal reflux disease without esophagitis: Secondary | ICD-10-CM | POA: Diagnosis present

## 2016-05-06 DIAGNOSIS — D649 Anemia, unspecified: Secondary | ICD-10-CM | POA: Diagnosis not present

## 2016-05-06 DIAGNOSIS — D638 Anemia in other chronic diseases classified elsewhere: Secondary | ICD-10-CM | POA: Diagnosis present

## 2016-05-06 DIAGNOSIS — Z6831 Body mass index (BMI) 31.0-31.9, adult: Secondary | ICD-10-CM | POA: Diagnosis not present

## 2016-05-06 DIAGNOSIS — I69398 Other sequelae of cerebral infarction: Secondary | ICD-10-CM | POA: Diagnosis not present

## 2016-05-06 DIAGNOSIS — I951 Orthostatic hypotension: Secondary | ICD-10-CM | POA: Diagnosis present

## 2016-05-06 DIAGNOSIS — D509 Iron deficiency anemia, unspecified: Secondary | ICD-10-CM | POA: Diagnosis present

## 2016-05-06 DIAGNOSIS — R531 Weakness: Secondary | ICD-10-CM | POA: Diagnosis not present

## 2016-05-06 DIAGNOSIS — F329 Major depressive disorder, single episode, unspecified: Secondary | ICD-10-CM | POA: Diagnosis present

## 2016-05-06 DIAGNOSIS — R42 Dizziness and giddiness: Secondary | ICD-10-CM | POA: Diagnosis not present

## 2016-05-06 DIAGNOSIS — D519 Vitamin B12 deficiency anemia, unspecified: Secondary | ICD-10-CM | POA: Diagnosis not present

## 2016-05-06 DIAGNOSIS — E119 Type 2 diabetes mellitus without complications: Secondary | ICD-10-CM | POA: Diagnosis not present

## 2016-05-06 DIAGNOSIS — R269 Unspecified abnormalities of gait and mobility: Secondary | ICD-10-CM | POA: Diagnosis not present

## 2016-05-06 DIAGNOSIS — Z85828 Personal history of other malignant neoplasm of skin: Secondary | ICD-10-CM | POA: Diagnosis not present

## 2016-05-06 DIAGNOSIS — K449 Diaphragmatic hernia without obstruction or gangrene: Secondary | ICD-10-CM | POA: Diagnosis present

## 2016-05-06 DIAGNOSIS — I69393 Ataxia following cerebral infarction: Secondary | ICD-10-CM | POA: Diagnosis not present

## 2016-05-06 DIAGNOSIS — R51 Headache: Secondary | ICD-10-CM | POA: Diagnosis not present

## 2016-05-06 DIAGNOSIS — I10 Essential (primary) hypertension: Secondary | ICD-10-CM | POA: Diagnosis not present

## 2016-05-06 DIAGNOSIS — I509 Heart failure, unspecified: Secondary | ICD-10-CM | POA: Diagnosis not present

## 2016-05-06 DIAGNOSIS — E669 Obesity, unspecified: Secondary | ICD-10-CM | POA: Diagnosis present

## 2016-05-06 DIAGNOSIS — Z7901 Long term (current) use of anticoagulants: Secondary | ICD-10-CM | POA: Diagnosis not present

## 2016-05-06 DIAGNOSIS — Z950 Presence of cardiac pacemaker: Secondary | ICD-10-CM | POA: Diagnosis not present

## 2016-05-06 DIAGNOSIS — D62 Acute posthemorrhagic anemia: Secondary | ICD-10-CM | POA: Diagnosis not present

## 2016-05-06 DIAGNOSIS — Z7984 Long term (current) use of oral hypoglycemic drugs: Secondary | ICD-10-CM | POA: Diagnosis not present

## 2016-05-06 DIAGNOSIS — I447 Left bundle-branch block, unspecified: Secondary | ICD-10-CM | POA: Diagnosis present

## 2016-05-06 DIAGNOSIS — I119 Hypertensive heart disease without heart failure: Secondary | ICD-10-CM | POA: Diagnosis present

## 2016-05-06 DIAGNOSIS — Z9981 Dependence on supplemental oxygen: Secondary | ICD-10-CM | POA: Diagnosis not present

## 2016-05-06 DIAGNOSIS — I69319 Unspecified symptoms and signs involving cognitive functions following cerebral infarction: Secondary | ICD-10-CM | POA: Diagnosis not present

## 2016-05-06 LAB — RAPID URINE DRUG SCREEN, HOSP PERFORMED
AMPHETAMINES: NOT DETECTED
BENZODIAZEPINES: POSITIVE — AB
Barbiturates: NOT DETECTED
Cocaine: NOT DETECTED
Opiates: NOT DETECTED
TETRAHYDROCANNABINOL: NOT DETECTED

## 2016-05-06 LAB — LIPID PANEL
CHOL/HDL RATIO: 3.6 ratio
CHOLESTEROL: 136 mg/dL (ref 0–200)
HDL: 38 mg/dL — AB (ref >40)
LDL Cholesterol: 84 mg/dL (ref 0–99)
Triglycerides: 70 mg/dL (ref <150)
VLDL: 14 mg/dL (ref 0–40)

## 2016-05-06 LAB — GLUCOSE, CAPILLARY
GLUCOSE-CAPILLARY: 109 mg/dL — AB (ref 65–99)
GLUCOSE-CAPILLARY: 141 mg/dL — AB (ref 65–99)
Glucose-Capillary: 154 mg/dL — ABNORMAL HIGH (ref 65–99)
Glucose-Capillary: 133 mg/dL — ABNORMAL HIGH (ref 65–99)

## 2016-05-06 LAB — HEMOGLOBIN A1C
Hgb A1c MFr Bld: 5.8 % — ABNORMAL HIGH (ref 4.8–5.6)
Mean Plasma Glucose: 120 mg/dL

## 2016-05-06 LAB — ECHOCARDIOGRAM COMPLETE
HEIGHTINCHES: 75 in
WEIGHTICAEL: 3992.97 [oz_av]

## 2016-05-06 MED ORDER — ATORVASTATIN CALCIUM 40 MG PO TABS
40.0000 mg | ORAL_TABLET | Freq: Every day | ORAL | Status: DC
  Administered 2016-05-06 – 2016-05-08 (×3): 40 mg via ORAL
  Filled 2016-05-06 (×3): qty 1

## 2016-05-06 MED ORDER — ENSURE ENLIVE PO LIQD
237.0000 mL | Freq: Three times a day (TID) | ORAL | Status: DC
  Administered 2016-05-06 – 2016-05-08 (×6): 237 mL via ORAL

## 2016-05-06 NOTE — Progress Notes (Signed)
Initial Nutrition Assessment  INTERVENTION:  Provide Ensure Enlive po TID, each supplement provides 350 kcal and 20 grams of protein Monitor PO intake for adequacy   NUTRITION DIAGNOSIS:   Predicted suboptimal nutrient intake related to lethargy/confusion, nausea as evidenced by per patient/family report, meal completion < 25%.   GOAL:   Patient will meet greater than or equal to 90% of their needs   MONITOR:   PO intake, Supplement acceptance, Labs, Weight trends, Skin, I & O's  REASON FOR ASSESSMENT:   Malnutrition Screening Tool    ASSESSMENT:    82 y.o. male with known A. fib status post pacemaker, hyperlipidemia, diabetes, hypertension, on Xarelto. Patient was brought to the hospital after waking up yesterday and feeling sudden onset of vertigo.  Pt with lunch tray in front of him at time of visit; pt lethargic and not eating. Per family ate bedside pt has not eaten since admission due to falling asleep at meal times and getting nauseous with dizziness. Per nursing notes pt had episodes of emesis last night. Pt denies any recent wt loss; he sates that he used to weigh 280 lbs a long time ago and reports weighing 243 lbs PTA. He reports eating well PTA.   RD encouraged PO intake. Pt agreeable to receiving Ensure.   Labs: hemoglobin A1c= 5.8%, glucose ranging 109 to 177 mg/dL  Diet Order:  Diet heart healthy/carb modified Room service appropriate? Yes; Fluid consistency: Thin  Skin:  Reviewed, no issues  Last BM:  9/11  Height:   Ht Readings from Last 1 Encounters:  05/05/16 6\' 3"  (1.905 m)    Weight:   Wt Readings from Last 1 Encounters:  05/05/16 249 lb 9 oz (113.2 kg)    Ideal Body Weight:  89.09 kg  BMI:  Body mass index is 31.19 kg/m.  Estimated Nutritional Needs:   Kcal:  2200-2400  Protein:  105-120 grams  Fluid:  2.4 L/day  EDUCATION NEEDS:   No education needs identified at this time  Scarlette Ar RD, LDN, CSP Inpatient Clinical  Dietitian Pager: [phone] After Hours Pager: [phone]

## 2016-05-06 NOTE — Progress Notes (Addendum)
PROGRESS NOTE  Richard [name]  [id number] DOB: 1931/03/17 DOA: 05/05/2016 PCP: Tivis Ringer, MD  Outpatient Specialists: Cardiology, Dr. Acie Fredrickson  Brief Narrative: Richard [name] is a 82 y.o. male with a history of complete heart block with PPM, OSA on CPAP, DM, anxiety, HLD, HTN, iron deficiency anemia, nephrolithiasis, and RMSF who presented 9/11 with generalized weakness and vertigo. Patient is a poor historian, but reports sudden onset of vertigo while bending downward with rotational L>R component associated with nausea. He's generally sedentary and has been more fatigued and felt diffusely weak for the past few days. Orthostatic vital signs were negative, CT scan unrevealing, and unfortunately his medtronic pacemaker is not compatible with MRI. Neurology has been consulted.  Assessment & Plan: Active Problems:   HTN (hypertension)   Sleep apnea   Diabetes mellitus (HCC)   Hiatal hernia   Atrial fibrillation (HCC)   Anemia   GERD (gastroesophageal reflux disease)   Orthostatic hypotension   B12 deficiency anemia   Anemia, iron deficiency   Generalized weakness   Stroke-like symptoms  Vertigo: CT without acute hemorrhage, CTA head and neck negative, and MRI not possible due to pacemaker. Orthostatics negative on admission. + ataxic symptoms on exam. DDx includes BPPV, labyrinthitis, glioma or CNS infarction. Doubt meniere's.  - Consult neurology - PT eval and treat with vestibular evaluation  - Meclizine prn - Continue CVA work up with echocardiogram - LDL 84, HbA1c 5.8.   Atrial fibrillation on xarelto: Rate controlled. LBBB on ECG unchanged from prior. No chest pain. Tn neg, AP CXR read as enlarged cardiac silhouette, interstitial edema.  - Continue metoprolol, amiodarone, xarelto.   General weakness/deconditioning: Unknown chronicity in sedentary elderly male. Thought to be underlying but not primary cause of dizziness.  - PT evaluation  Hypertension: Chronic,  hypertensive at admission - Continue home medications - Monitor  T2DM: Hold home oral diabetic medications.  - SSI - Heart healthy, carb modified diet  Anemia of chronic disease: Chronic and stable  - Follow up by dr. Whitney Muse   Depression Continue Zoloft  DVT prophylaxis: xarelto Code Status: Full Family Communication: Wife at bedside Disposition Plan: Neurology consultation and physical therapy evaluation pending, possible discharge in next 24 hours.   Consultants:   Neurology, Dr. Leonel Ramsay  Procedures:   None  Antimicrobials:  None   Subjective: Still feel room tilting and nauseated with most head movements, no ear fullness, tinnitus, + HOH chronically, no h/o vertigo, no recent illness.   Objective: Vitals:   05/06/16 0300 05/06/16 0500 05/06/16 0700 05/06/16 0900  BP: (!) 159/72 (!) 169/79 (!) 171/81 (!) 162/75  Pulse: 73 61 75 93  Resp: 18 18 18 20   Temp:      TempSrc:      SpO2: 98% 95% 94% 93%  Weight:      Height:        Intake/Output Summary (Last 24 hours) at 05/06/16 1227 Last data filed at 05/06/16 0600  Gross per 24 hour  Intake              345 ml  Output              800 ml  Net             -455 ml   Filed Weights   05/05/16 2045  Weight: 113.2 kg (249 lb 9 oz)    Examination: General exam: Elderly male in no distress Respiratory system: Non-labored breathing. Clear to auscultation bilaterally.  Cardiovascular system:  Regular rate and rhythm. No murmur, rub, or gallop. No JVD, and no pedal edema. Gastrointestinal system: Abdomen soft, non-tender, non-distended, with normoactive bowel sounds. No organomegaly or masses felt. Central nervous system: Drowsy but easily rousable, and oriented. Bilateral horizontal nystagmus noted, PERRL, EOMI, bilateral symmetric ptosis resolves with command, no facial droop, no aphasia, very HOH, gait deferred. Symmetric 5/5 strength in extremities. Grossly abnormal finger to nose.  Extremities: Warm,  no deformities Skin: No rashes, lesions orulcers Psychiatry: Judgement and insight appear normal. Mood & affect appropriate.   Data Reviewed: I have personally reviewed following labs and imaging studies  CBC:  Recent Labs Lab 05/05/16 1035  WBC 5.2  NEUTROABS 3.9  HGB 10.0*  HCT 33.2*  MCV 97.9  PLT [id number]*   Basic Metabolic Panel:  Recent Labs Lab 05/05/16 1035  NA 140  K 3.8  CL 103  CO2 26  GLUCOSE 188*  BUN 13  CREATININE 1.04  CALCIUM 8.9   GFR: Estimated Creatinine Clearance (by C-G formula based on SCr of 1.04 mg/dL) Male: 60.4 mL/min Male: 73.1 mL/min Liver Function Tests:  Recent Labs Lab 05/05/16 1035  AST 32  ALT 22  ALKPHOS 77  BILITOT 0.6  PROT 7.3  ALBUMIN 3.5   No results for input(s): LIPASE, AMYLASE in the last 168 hours. No results for input(s): AMMONIA in the last 168 hours. Coagulation Profile:  Recent Labs Lab 05/05/16 1634  INR 1.22   Cardiac Enzymes:  Recent Labs Lab 05/05/16 1634  TROPONINI <0.03   BNP (last 3 results) No results for input(s): PROBNP in the last 8760 hours. HbA1C:  Recent Labs  05/05/16 1634  HGBA1C 5.8*   CBG:  Recent Labs Lab 05/05/16 1041 05/05/16 1642 05/05/16 2049 05/06/16 0621 05/06/16 1135  GLUCAP 177* 133* 155* 109* 154*   Lipid Profile:  Recent Labs  05/06/16 0531  CHOL 136  HDL 38*  LDLCALC 84  TRIG 70  CHOLHDL 3.6   Thyroid Function Tests: No results for input(s): TSH, T4TOTAL, FREET4, T3FREE, THYROIDAB in the last 72 hours. Anemia Panel: No results for input(s): VITAMINB12, FOLATE, FERRITIN, TIBC, IRON, RETICCTPCT in the last 72 hours. Urine analysis:    Component Value Date/Time   COLORURINE YELLOW 05/05/2016 1118   APPEARANCEUR CLEAR 05/05/2016 1118   LABSPEC 1.025 05/05/2016 1118   PHURINE 6.0 05/05/2016 1118   GLUCOSEU 250 (A) 05/05/2016 1118   HGBUR MODERATE (A) 05/05/2016 1118   BILIRUBINUR NEGATIVE 05/05/2016 1118   KETONESUR NEGATIVE 05/05/2016 1118    PROTEINUR 100 (A) 05/05/2016 1118   UROBILINOGEN 0.2 03/30/2014 1400   NITRITE NEGATIVE 05/05/2016 1118   LEUKOCYTESUR NEGATIVE 05/05/2016 1118   Sepsis Labs: @LABRCNTIP (procalcitonin:4,lacticidven:4)  )No results found for this or any previous visit (from the past 240 hour(s)).   Radiology Studies: Ct Angio Head W/cm &/or Wo Cm  Result Date: 05/05/2016 CLINICAL DATA:  82 y/o  M; acute dizziness and generalized weakness. EXAM: CT ANGIOGRAPHY HEAD AND NECK TECHNIQUE: Multidetector CT imaging of the head and neck was performed using the standard protocol during bolus administration of intravenous contrast. Multiplanar CT image reconstructions and MIPs were obtained to evaluate the vascular anatomy. Carotid stenosis measurements (when applicable) are obtained utilizing NASCET criteria, using the distal internal carotid diameter as the denominator. CONTRAST:  75 cc Isovue 370 COMPARISON:  05/05/2016 CT head.  05/18/2013 MRI brain. FINDINGS: CTA NECK Aortic arch: Mild calcific atherosclerosis of the arch. Four vessel arch. Right carotid system: No occlusion, aneurysm, dissection, or significant stenosis  is identified. Minimal mixed plaque at the bifurcation. Retropharyngeal course of common carotid artery. Left carotid system: No occlusion, aneurysm, dissection, or significant stenosis is identified. Minimal mixed plaque at the bifurcation. Retropharyngeal course of common carotid artery. Vertebral arteries:No occlusion, aneurysm, dissection, or significant stenosis is identified. Skeleton: Multilevel degenerative changes of the cervical spine most pronounced at the C5 through C7 levels. No high-grade bony canal stenosis or foraminal narrowing. Other neck: Small thyroid nodules the largest in the right lower pole measuring 10 patent aerodigestive tract. Inflammatory calcifications of palatini tonsils. Fatty atrophy of the right parotid gland and calcifications probably represent sequelae of prior  parotiditis. No lymphadenopathy or discrete cervical mass is identified. Left maxillary sinus aerosolized secretions, chronic inflammatory changes, and atelectasis. Otherwise visualized paranasal sinuses and mastoids are clear. Bilateral intra-ocular lens replacement. Partially visualized pacemaker. Upper chest: Peripheral reticular changes in the lung probably represents mild fibrosis. CTA HEAD Anterior circulation: Moderate calcific atherosclerosis of cavernous and paraclinoid internal carotid artery is bilateral middle cerebral arteries, anterior cerebral arteries, and internal carotid arteries are patent. Symmetric distal circulation. No occlusion, aneurysm, dissection, or significant stenosis is identified. Posterior circulation: Minimal calcification of the bilateral V4 segments. Bilateral vertebral arteries, the basilar artery, and posterior cerebral arteries are patent. Symmetric distal circulation. No occlusion, aneurysm, dissection, or significant stenosis is identified. Venous sinuses: No thrombus identified. Anatomic variants: Patent anterior communicating artery. Fetal left PCA. No right posterior communicating artery identified, hypoplastic or absent. Delayed phase: Small hyperdense focus within the right paramedian pons, dense on noncontrast CT, possibly mineralization related to old lacunar infarct. No definite abnormal enhancement. IMPRESSION: No occlusion, aneurysm, dissection, or significant stenosis of the circle of Willis or carotid and vertebral arteries of the neck is identified. Mild mixed plaque of the carotid bifurcations and intracranial calcific atherosclerosis. Electronically Signed   By: Kristine Garbe M.D.   On: 05/05/2016 20:36   Dg Chest 2 View  Result Date: 05/05/2016 CLINICAL DATA:  Weakness, dizziness and left-sided chest pain. EXAM: CHEST  2 VIEW COMPARISON:  06/04/2014 FINDINGS: The pacer wires are stable. The heart is enlarged but unchanged. There is marked  tortuosity, ectasia and calcification of the thoracic aorta which appears stable. Mild chronic bronchitic changes with suspected superimposed interstitial pulmonary edema with peribronchial thickening and increased interseptal lines peripherally (Kerley B-lines) small bilateral pleural effusions. Stable hiatal hernia. IMPRESSION: 1. Cardiac enlargement, interstitial edema and small effusions suggesting CHF. 2. Stable large hiatal hernia. 3. Stable right atrial and right ventricular pacer wires. Electronically Signed   By: Marijo Sanes M.D.   On: 05/05/2016 11:12   Ct Head Wo Contrast  Result Date: 05/05/2016 CLINICAL DATA:  82 year old male with syncope, severe generalized weakness nausea and dizziness. Initial encounter. EXAM: CT HEAD WITHOUT CONTRAST TECHNIQUE: Contiguous axial images were obtained from the base of the skull through the vertex without intravenous contrast. COMPARISON:  Head CT without contrast 05/31/2014. Brain MRI 05/18/2013. FINDINGS: Brain: Chronic confluent cerebral white matter hypodensity bilaterally. New heterogeneity in the region of the left thalamus since 2015. No acute or chronic cortically based infarct identified. No midline shift, mass effect, or evidence of intracranial mass lesion. No ventriculomegaly. No acute intracranial hemorrhage identified. Vascular: Calcified atherosclerosis at the skull base. A degree of chronic intracranial artery dolichoectasia is again noted. No suspicious intracranial vascular hyperdensity. Skull: No acute osseous abnormality identified. Sinuses/Orbits: Visualized paranasal sinuses and mastoids are stable and well pneumatized. Other: No acute orbit or scalp soft tissue findings. IMPRESSION: 1. Evidence of  progressed chronic small vessel disease in the left thalamus since 2015. Underlying chronic white matter disease. 2.  No acute intracranial abnormality identified. Electronically Signed   By: Genevie Ann M.D.   On: 05/05/2016 12:46   Ct Angio Neck W  Or Wo Contrast  Result Date: 05/05/2016 CLINICAL DATA:  82 y/o  M; acute dizziness and generalized weakness. EXAM: CT ANGIOGRAPHY HEAD AND NECK TECHNIQUE: Multidetector CT imaging of the head and neck was performed using the standard protocol during bolus administration of intravenous contrast. Multiplanar CT image reconstructions and MIPs were obtained to evaluate the vascular anatomy. Carotid stenosis measurements (when applicable) are obtained utilizing NASCET criteria, using the distal internal carotid diameter as the denominator. CONTRAST:  75 cc Isovue 370 COMPARISON:  05/05/2016 CT head.  05/18/2013 MRI brain. FINDINGS: CTA NECK Aortic arch: Mild calcific atherosclerosis of the arch. Four vessel arch. Right carotid system: No occlusion, aneurysm, dissection, or significant stenosis is identified. Minimal mixed plaque at the bifurcation. Retropharyngeal course of common carotid artery. Left carotid system: No occlusion, aneurysm, dissection, or significant stenosis is identified. Minimal mixed plaque at the bifurcation. Retropharyngeal course of common carotid artery. Vertebral arteries:No occlusion, aneurysm, dissection, or significant stenosis is identified. Skeleton: Multilevel degenerative changes of the cervical spine most pronounced at the C5 through C7 levels. No high-grade bony canal stenosis or foraminal narrowing. Other neck: Small thyroid nodules the largest in the right lower pole measuring 10 patent aerodigestive tract. Inflammatory calcifications of palatini tonsils. Fatty atrophy of the right parotid gland and calcifications probably represent sequelae of prior parotiditis. No lymphadenopathy or discrete cervical mass is identified. Left maxillary sinus aerosolized secretions, chronic inflammatory changes, and atelectasis. Otherwise visualized paranasal sinuses and mastoids are clear. Bilateral intra-ocular lens replacement. Partially visualized pacemaker. Upper chest: Peripheral reticular  changes in the lung probably represents mild fibrosis. CTA HEAD Anterior circulation: Moderate calcific atherosclerosis of cavernous and paraclinoid internal carotid artery is bilateral middle cerebral arteries, anterior cerebral arteries, and internal carotid arteries are patent. Symmetric distal circulation. No occlusion, aneurysm, dissection, or significant stenosis is identified. Posterior circulation: Minimal calcification of the bilateral V4 segments. Bilateral vertebral arteries, the basilar artery, and posterior cerebral arteries are patent. Symmetric distal circulation. No occlusion, aneurysm, dissection, or significant stenosis is identified. Venous sinuses: No thrombus identified. Anatomic variants: Patent anterior communicating artery. Fetal left PCA. No right posterior communicating artery identified, hypoplastic or absent. Delayed phase: Small hyperdense focus within the right paramedian pons, dense on noncontrast CT, possibly mineralization related to old lacunar infarct. No definite abnormal enhancement. IMPRESSION: No occlusion, aneurysm, dissection, or significant stenosis of the circle of Willis or carotid and vertebral arteries of the neck is identified. Mild mixed plaque of the carotid bifurcations and intracranial calcific atherosclerosis. Electronically Signed   By: Kristine Garbe M.D.   On: 05/05/2016 20:36    Scheduled Meds: .  stroke: mapping our early stages of recovery book   Does not apply Once  . amiodarone  200 mg Oral Once per day on Mon Wed Fri  . amLODipine  2.5 mg Oral Daily  . benazepril  40 mg Oral Daily  . insulin aspart  0-9 Units Subcutaneous TID WC  . metoprolol  50 mg Oral BID  . pantoprazole  40 mg Oral Daily  . rivaroxaban  20 mg Oral Q supper  . sertraline  100 mg Oral Daily   Continuous Infusions: . sodium chloride 75 mL/hr at 05/06/16 0911     LOS: 0 days  Time spent: 25 minutes.  Vance Gather, MD [facility] Pager  [phone]  If 7PM-7AM, please contact night-coverage [email or web address] Password [facility] 05/06/2016, 12:27 PM

## 2016-05-06 NOTE — Care Management Note (Signed)
Case Management Note  Patient Details  Name: Richard [name] MRN: [id number] Date of Birth: [birth year]  Subjective/Objective:   Pt in with HTN and dizziness. He is from home with his wife.                  Action/Plan: Awaiting PT/OT recommendations. CM following for d/c disposition.   Expected Discharge Date:                  Expected Discharge Plan:     In-House Referral:     Discharge planning Services     Post Acute Care Choice:    Choice offered to:     DME Arranged:    DME Agency:     HH Arranged:    HH Agency:     Status of Service:  In process, will continue to follow  If discussed at Long Length of Stay Meetings, dates discussed:    Additional Comments:  Pollie Friar, RN 05/06/2016, 3:20 PM

## 2016-05-06 NOTE — Care Management Obs Status (Signed)
[place] NOTIFICATION   Patient Details  Name: Richard [name] MRN: [id number] Date of Birth: 17-Jul-1931   Medicare Observation Status Notification Given:  Yes    Pollie Friar, RN 05/06/2016, 2:42 PM

## 2016-05-06 NOTE — Consult Note (Signed)
Requesting Physician: Dr. Bonner Puna    Chief Complaint:  Vertigo and unsteady gait  History obtained from:  Patient     HPI:                                                                                                                                         DEONTREZ SLAVENS is an 82 y.o. male with known A. fib status post pacemaker, hyperlipidemia, diabetes, hypertension, on Xarelto. Patient was brought to the hospital after waking up yesterday and feeling sudden onset of vertigo. He states that the room was moving from left to right and he was significantly off balance. He also endorses generalized weakness. That said he states on a daily basis he pretty much is non-mobile sitting around watching TV however he will mow the grass but this is on a riding lawnmower. On arrival patient had a CT scan of the head however this was negative he is unable to have an MRI secondary to pacemaker. Patient continues to have significant vertiginous sensation.  The vertigo is mild when going from a lying to sitting position however upon standing he has significant sensation of vertigo along with nystagmus noted. The Dix-Hallpike maneuver was obtained and showed significant fast beating nystagmus to the right when the head was turned to the left, and fast beat nystagmus to the left when the head was turned to the right.  Daughter who is in the room states that she is noted his speech being much more slurred than usual  Date last known well: Date: 05/04/2016 Time last known well: Unable to determine tPA Given: No: Out of window and on Xarelto   Past Medical History:  Diagnosis Date  . Anxiety   . Arthritis   . Atrial fibrillation ([place])    a. Dx 03/2013, notes report atrial fibrillation/atrial flutter, placed on amiodarone. NSR in subsequent OV's.  . B12 deficiency anemia   . Chest pain    a. H/o CTA negative for PE 2012, normal cath 2005, normal nucs previously including 05/2012.  Marland Kitchen Complete heart block (HCC)     a. s/p Medtronic Adapta L model ADDRL 1 (serial number [id number]) pacemaker.  Marland Kitchen GERD (gastroesophageal reflux disease)   . Hiatal hernia   . Hyperlipidemia   . Hypertension   . Iron deficiency anemia   . LBBB (left bundle branch block)   . Microcytic anemia   . Nephrolithiasis   . On home oxygen therapy    a. 2L w/CPAP at night  . Orthostatic hypotension   . OSA on CPAP    cpap  . [place] spotted fever ~ 1945  . Sinus drainage   . Skin cancer of lip   . Type II diabetes mellitus ([place])     Past Surgical History:  Procedure Laterality Date  . CARDIAC CATHETERIZATION     by Dr. Acie Fredrickson, January 24, 2004, that  shows minimal coronary artery irregularities and normal left ventricular function  . CATARACT EXTRACTION W/ INTRAOCULAR LENS  IMPLANT, BILATERAL Bilateral   . ESOPHAGOGASTRODUODENOSCOPY (EGD) WITH PROPOFOL N/A 06/21/2015   Procedure: ESOPHAGOGASTRODUODENOSCOPY (EGD) WITH PROPOFOL;  Surgeon: Gatha Mayer, MD;  Location: [facility];  Service: Endoscopy;  Laterality: N/A;  . INGUINAL HERNIA REPAIR Right   . LUMBAR DISC SURGERY  ~ 1993  . PERMANENT PACEMAKER INSERTION N/A 06/03/2014   MDT Adapta L implanted by Dr Rayann Heman for syncope and transient AV block  . SKIN CANCER EXCISION     "lower lip" (04/06/2013)  . TEMPORARY PACEMAKER INSERTION N/A 05/31/2014   Procedure: TEMPORARY WIRE;  Surgeon: Sinclair Grooms, MD;  Location: [facility] CATH LAB;  Service: Cardiovascular;  Laterality: N/A;  . VASECTOMY     Hx of     Family History  Problem Relation Age of Onset  . Other      Parents both died of old age; other conditions not known  . Stroke Brother   . Breast cancer Sister   . Pneumonia Father   . Thyroid disease Mother     goiter   Social History:  reports that he has quit smoking. His smoking use included Cigarettes. He smoked 0.00 packs per day for 20.00 years. He has never used smokeless tobacco. He reports that he does not drink alcohol or use drugs.  Allergies:   Allergies  Allergen Reactions  . Codeine Other (See Comments)    Hallucinations   . Citalopram Swelling and Rash    Medications:                                                                                                                           Prior to Admission:  Prescriptions Prior to Admission  Medication Sig Dispense Refill Last Dose  . acetaminophen (TYLENOL) 500 MG tablet Take 500 mg by mouth 2 (two) times daily.    05/05/2016 at Unknown time  . amiodarone (PACERONE) 200 MG tablet Take 200 mg by mouth 3 (three) times a week. On Monday, Wednesday & Friday   05/05/2016 at Unknown time  . amLODipine (NORVASC) 2.5 MG tablet Take 1 tablet (2.5 mg total) by mouth daily. 30 tablet 11 05/05/2016 at Unknown time  . benazepril (LOTENSIN) 40 MG tablet Take 40 mg by mouth daily.     05/04/2016 at Unknown time  . cyanocobalamin (,VITAMIN B-12,) 1000 MCG/ML injection Inject 1,000 mcg into the muscle every 30 (thirty) days.   Past Month at Unknown time  . diazepam (VALIUM) 5 MG tablet Take 5 mg by mouth at bedtime.     05/04/2016 at Unknown time  . fluticasone (FLONASE) 50 MCG/ACT nasal spray Place 1 spray into the nose daily as needed for allergies or rhinitis.    Past Month at Unknown time  . metFORMIN (GLUCOPHAGE) 1000 MG tablet Take 1,000 mg by mouth 2 (two) times daily with a meal.   05/05/2016 at Unknown  time  . metoprolol (LOPRESSOR) 50 MG tablet TAKE 1 TABLET BY MOUTH TWICE A DAY 60 tablet 11 05/05/2016 at 0830  . omeprazole (PRILOSEC) 20 MG capsule Take 20 mg by mouth daily.    05/04/2016 at Unknown time  . OXYGEN Inhale into the lungs at bedtime. CPAP with OXYGEN   05/04/2016 at Unknown time  . pioglitazone (ACTOS) 30 MG tablet Take 30 mg by mouth daily.     05/05/2016 at Unknown time  . sertraline (ZOLOFT) 100 MG tablet Take 1 tablet by mouth daily.   05/05/2016 at Unknown time  . XARELTO 20 MG TABS tablet TAKE 1 TABLET (20 MG TOTAL) BY MOUTH DAILY WITH SUPPER. 30 tablet 11 05/04/2016 at  Unknown time   Scheduled: .  stroke: mapping our early stages of recovery book   Does not apply Once  . amiodarone  200 mg Oral Once per day on Mon Wed Fri  . amLODipine  2.5 mg Oral Daily  . benazepril  40 mg Oral Daily  . insulin aspart  0-9 Units Subcutaneous TID WC  . metoprolol  50 mg Oral BID  . pantoprazole  40 mg Oral Daily  . rivaroxaban  20 mg Oral Q supper  . sertraline  100 mg Oral Daily    ROS:                                                                                                                                       History obtained from the patient  General ROS: negative for - chills, fatigue, fever, night sweats, weight gain or weight loss Psychological ROS: negative for - behavioral disorder, hallucinations, memory difficulties, mood swings or suicidal ideation Ophthalmic ROS: negative for - blurry vision, double vision, eye pain or loss of vision ENT ROS: negative for - epistaxis, nasal discharge, oral lesions, sore throat, tinnitus or vertigo Allergy and Immunology ROS: negative for - hives or itchy/watery eyes Hematological and Lymphatic ROS: negative for - bleeding problems, bruising or swollen lymph nodes Endocrine ROS: negative for - galactorrhea, hair pattern changes, polydipsia/polyuria or temperature intolerance Respiratory ROS: negative for - cough, hemoptysis, shortness of breath or wheezing Cardiovascular ROS: negative for - chest pain, dyspnea on exertion, edema or irregular heartbeat Gastrointestinal ROS: negative for - abdominal pain, diarrhea, hematemesis, nausea/vomiting or stool incontinence Genito-Urinary ROS: negative for - dysuria, hematuria, incontinence or urinary frequency/urgency Musculoskeletal ROS: negative for - joint swelling or muscular weakness Neurological ROS: as noted in HPI Dermatological ROS: negative for rash and skin lesion changes  Neurologic Examination:  Blood pressure (!) 162/75, pulse 93, temperature 97.7 F (36.5 C), temperature source Oral, resp. rate 20, height 6\' 3"  (1.905 m), weight 113.2 kg (249 lb 9 oz), SpO2 93 %.  HEENT-  Normocephalic, no lesions, without obvious abnormality.  Normal external eye and conjunctiva.  Normal TM's bilaterally.  Normal auditory canals and external ears. Normal external nose, mucus membranes and septum.  Normal pharynx. Cardiovascular- S1, S2 normal, pulses palpable throughout   Lungs- chest clear, no wheezing, rales, normal symmetric air entry Abdomen- normal findings: bowel sounds normal Extremities- no edema Lymph-no adenopathy palpable Musculoskeletal-no joint tenderness, deformity or swelling Skin-warm and dry, no hyperpigmentation, vitiligo, or suspicious lesions  Neurological Examination Mental Status: Alert, oriented, thought content appropriate.  Speech dysarthric without evidence of aphasia.  Able to follow 3 step commands without difficulty. Cranial Nerves: II: Discs flat bilaterally; Visual fields grossly normal, pupils equal, round, reactive to light and accommodation III,IV, VI: ptosis not present, extra-ocular motions intact bilaterally V,VII: smile symmetric, facial light touch sensation normal bilaterally VIII: hearing normal bilaterally IX,X: uvula rises symmetrically XI: bilateral shoulder shrug XII: midline tongue extension Motor: Right : Upper extremity   5/5    Left:     Upper extremity   5/5  Lower extremity   5/5     Lower extremity   5/5 Tone and bulk:normal tone throughout; no atrophy noted Sensory: Pinprick and light touch intact throughout, bilaterally, with peripheral neuropathy in the bilateral stocking distribution of the legs Deep Tendon Reflexes: 1+ and symmetric throughout Plantars: Right: downgoing   Left: downgoing Cerebellar: Dysmetric finger-to-nose with the right worse than the left,  and dysmetric heel-to-shin test on the  left Gait: Not obtained       Lab Results: Basic Metabolic Panel:  Recent Labs Lab 05/05/16 1035  NA 140  K 3.8  CL 103  CO2 26  GLUCOSE 188*  BUN 13  CREATININE 1.04  CALCIUM 8.9    Liver Function Tests:  Recent Labs Lab 05/05/16 1035  AST 32  ALT 22  ALKPHOS 77  BILITOT 0.6  PROT 7.3  ALBUMIN 3.5   No results for input(s): LIPASE, AMYLASE in the last 168 hours. No results for input(s): AMMONIA in the last 168 hours.  CBC:  Recent Labs Lab 05/05/16 1035  WBC 5.2  NEUTROABS 3.9  HGB 10.0*  HCT 33.2*  MCV 97.9  PLT 145*    Cardiac Enzymes:  Recent Labs Lab 05/05/16 1634  TROPONINI <0.03    Lipid Panel:  Recent Labs Lab 05/06/16 0531  CHOL 136  TRIG 70  HDL 38*  CHOLHDL 3.6  VLDL 14  LDLCALC 84    CBG:  Recent Labs Lab 05/05/16 1041 05/05/16 1642 05/05/16 2049 05/06/16 0621  GLUCAP 177* 133* 155* 109*    Microbiology: Results for orders placed or performed during the hospital encounter of 05/31/14  MRSA PCR Screening     Status: None   Collection Time: 05/31/14  3:55 PM  Result Value Ref Range Status   MRSA by PCR NEGATIVE NEGATIVE Final    Comment:        The GeneXpert MRSA Assay (FDA approved for NASAL specimens only), is one component of a comprehensive MRSA colonization surveillance program. It is not intended to diagnose MRSA infection nor to guide or monitor treatment for MRSA infections.    Coagulation Studies:  Recent Labs  05/05/16 1634  LABPROT 15.5*  INR 1.22    Imaging: Ct Angio Head W/cm &/or Wo Cm  Result Date: 05/05/2016 CLINICAL DATA:  82 y/o  M; acute dizziness and generalized weakness. EXAM: CT ANGIOGRAPHY HEAD AND NECK TECHNIQUE: Multidetector CT imaging of the head and neck was performed using the standard protocol during bolus administration of intravenous contrast. Multiplanar CT image reconstructions and MIPs were obtained to evaluate the vascular anatomy. Carotid stenosis  measurements (when applicable) are obtained utilizing NASCET criteria, using the distal internal carotid diameter as the denominator. CONTRAST:  75 cc Isovue 370 COMPARISON:  05/05/2016 CT head.  05/18/2013 MRI brain. FINDINGS: CTA NECK Aortic arch: Mild calcific atherosclerosis of the arch. Four vessel arch. Right carotid system: No occlusion, aneurysm, dissection, or significant stenosis is identified. Minimal mixed plaque at the bifurcation. Retropharyngeal course of common carotid artery. Left carotid system: No occlusion, aneurysm, dissection, or significant stenosis is identified. Minimal mixed plaque at the bifurcation. Retropharyngeal course of common carotid artery. Vertebral arteries:No occlusion, aneurysm, dissection, or significant stenosis is identified. Skeleton: Multilevel degenerative changes of the cervical spine most pronounced at the C5 through C7 levels. No high-grade bony canal stenosis or foraminal narrowing. Other neck: Small thyroid nodules the largest in the right lower pole measuring 10 patent aerodigestive tract. Inflammatory calcifications of palatini tonsils. Fatty atrophy of the right parotid gland and calcifications probably represent sequelae of prior parotiditis. No lymphadenopathy or discrete cervical mass is identified. Left maxillary sinus aerosolized secretions, chronic inflammatory changes, and atelectasis. Otherwise visualized paranasal sinuses and mastoids are clear. Bilateral intra-ocular lens replacement. Partially visualized pacemaker. Upper chest: Peripheral reticular changes in the lung probably represents mild fibrosis. CTA HEAD Anterior circulation: Moderate calcific atherosclerosis of cavernous and paraclinoid internal carotid artery is bilateral middle cerebral arteries, anterior cerebral arteries, and internal carotid arteries are patent. Symmetric distal circulation. No occlusion, aneurysm, dissection, or significant stenosis is identified. Posterior circulation:  Minimal calcification of the bilateral V4 segments. Bilateral vertebral arteries, the basilar artery, and posterior cerebral arteries are patent. Symmetric distal circulation. No occlusion, aneurysm, dissection, or significant stenosis is identified. Venous sinuses: No thrombus identified. Anatomic variants: Patent anterior communicating artery. Fetal left PCA. No right posterior communicating artery identified, hypoplastic or absent. Delayed phase: Small hyperdense focus within the right paramedian pons, dense on noncontrast CT, possibly mineralization related to old lacunar infarct. No definite abnormal enhancement. IMPRESSION: No occlusion, aneurysm, dissection, or significant stenosis of the circle of Willis or carotid and vertebral arteries of the neck is identified. Mild mixed plaque of the carotid bifurcations and intracranial calcific atherosclerosis. Electronically Signed   By: Kristine Garbe M.D.   On: 05/05/2016 20:36   Dg Chest 2 View  Result Date: 05/05/2016 CLINICAL DATA:  Weakness, dizziness and left-sided chest pain. EXAM: CHEST  2 VIEW COMPARISON:  06/04/2014 FINDINGS: The pacer wires are stable. The heart is enlarged but unchanged. There is marked tortuosity, ectasia and calcification of the thoracic aorta which appears stable. Mild chronic bronchitic changes with suspected superimposed interstitial pulmonary edema with peribronchial thickening and increased interseptal lines peripherally (Kerley B-lines) small bilateral pleural effusions. Stable hiatal hernia. IMPRESSION: 1. Cardiac enlargement, interstitial edema and small effusions suggesting CHF. 2. Stable large hiatal hernia. 3. Stable right atrial and right ventricular pacer wires. Electronically Signed   By: Marijo Sanes M.D.   On: 05/05/2016 11:12   Ct Head Wo Contrast  Result Date: 05/05/2016 CLINICAL DATA:  83 year old male with syncope, severe generalized weakness nausea and dizziness. Initial encounter. EXAM: CT HEAD  WITHOUT CONTRAST TECHNIQUE: Contiguous axial images were obtained from the base of the skull  through the vertex without intravenous contrast. COMPARISON:  Head CT without contrast 05/31/2014. Brain MRI 05/18/2013. FINDINGS: Brain: Chronic confluent cerebral white matter hypodensity bilaterally. New heterogeneity in the region of the left thalamus since 2015. No acute or chronic cortically based infarct identified. No midline shift, mass effect, or evidence of intracranial mass lesion. No ventriculomegaly. No acute intracranial hemorrhage identified. Vascular: Calcified atherosclerosis at the skull base. A degree of chronic intracranial artery dolichoectasia is again noted. No suspicious intracranial vascular hyperdensity. Skull: No acute osseous abnormality identified. Sinuses/Orbits: Visualized paranasal sinuses and mastoids are stable and well pneumatized. Other: No acute orbit or scalp soft tissue findings. IMPRESSION: 1. Evidence of progressed chronic small vessel disease in the left thalamus since 2015. Underlying chronic white matter disease. 2.  No acute intracranial abnormality identified. Electronically Signed   By: Genevie Ann M.D.   On: 05/05/2016 12:46   Ct Angio Neck W Or Wo Contrast  Result Date: 05/05/2016 CLINICAL DATA:  82 y/o  M; acute dizziness and generalized weakness. EXAM: CT ANGIOGRAPHY HEAD AND NECK TECHNIQUE: Multidetector CT imaging of the head and neck was performed using the standard protocol during bolus administration of intravenous contrast. Multiplanar CT image reconstructions and MIPs were obtained to evaluate the vascular anatomy. Carotid stenosis measurements (when applicable) are obtained utilizing NASCET criteria, using the distal internal carotid diameter as the denominator. CONTRAST:  75 cc Isovue 370 COMPARISON:  05/05/2016 CT head.  05/18/2013 MRI brain. FINDINGS: CTA NECK Aortic arch: Mild calcific atherosclerosis of the arch. Four vessel arch. Right carotid system: No  occlusion, aneurysm, dissection, or significant stenosis is identified. Minimal mixed plaque at the bifurcation. Retropharyngeal course of common carotid artery. Left carotid system: No occlusion, aneurysm, dissection, or significant stenosis is identified. Minimal mixed plaque at the bifurcation. Retropharyngeal course of common carotid artery. Vertebral arteries:No occlusion, aneurysm, dissection, or significant stenosis is identified. Skeleton: Multilevel degenerative changes of the cervical spine most pronounced at the C5 through C7 levels. No high-grade bony canal stenosis or foraminal narrowing. Other neck: Small thyroid nodules the largest in the right lower pole measuring 10 patent aerodigestive tract. Inflammatory calcifications of palatini tonsils. Fatty atrophy of the right parotid gland and calcifications probably represent sequelae of prior parotiditis. No lymphadenopathy or discrete cervical mass is identified. Left maxillary sinus aerosolized secretions, chronic inflammatory changes, and atelectasis. Otherwise visualized paranasal sinuses and mastoids are clear. Bilateral intra-ocular lens replacement. Partially visualized pacemaker. Upper chest: Peripheral reticular changes in the lung probably represents mild fibrosis. CTA HEAD Anterior circulation: Moderate calcific atherosclerosis of cavernous and paraclinoid internal carotid artery is bilateral middle cerebral arteries, anterior cerebral arteries, and internal carotid arteries are patent. Symmetric distal circulation. No occlusion, aneurysm, dissection, or significant stenosis is identified. Posterior circulation: Minimal calcification of the bilateral V4 segments. Bilateral vertebral arteries, the basilar artery, and posterior cerebral arteries are patent. Symmetric distal circulation. No occlusion, aneurysm, dissection, or significant stenosis is identified. Venous sinuses: No thrombus identified. Anatomic variants: Patent anterior  communicating artery. Fetal left PCA. No right posterior communicating artery identified, hypoplastic or absent. Delayed phase: Small hyperdense focus within the right paramedian pons, dense on noncontrast CT, possibly mineralization related to old lacunar infarct. No definite abnormal enhancement. IMPRESSION: No occlusion, aneurysm, dissection, or significant stenosis of the circle of Willis or carotid and vertebral arteries of the neck is identified. Mild mixed plaque of the carotid bifurcations and intracranial calcific atherosclerosis. Electronically Signed   By: Kristine Garbe M.D.   On: 05/05/2016 20:36  Assessment and plan discussed with with attending physician and they are in agreement.    Etta Quill PA-C [facility] [phone]  05/06/2016, 11:10 AM   On my exam, the patient appears to have right arm and leg dysmetria, dysarthria.  Assessment: 82 y.o. male with known atrial fibrillation, third-degree heart block status post pacemaker with chief complaint of sudden onset of vertigo, unsteady gait, dysarthria, and generalized weakness. Given the findings of unilateral dysmetria, I do suspect that this represents small cerebellar infarct.  Stroke Risk Factors - atrial fibrillation, diabetes mellitus, hyperlipidemia and hypertension  1) physical therapy 2) statin for LDL greater than 70 3) echocardiogram 4) continue Xarelto 5) stroke team to follow  Roland Rack, MD [facility] [phone]  If 7pm- 7am, please page neurology on call as listed in [place].

## 2016-05-06 NOTE — Progress Notes (Signed)
  Echocardiogram 2D Echocardiogram has been performed.  Jennette Dubin 05/06/2016, 2:26 PM

## 2016-05-06 NOTE — Evaluation (Signed)
Occupational Therapy Evaluation Patient Details Name: Richard [name] MRN: [id number] DOB: 07-15-1931 Today's Date: 05/06/2016    History of Present Illness 82 y.o. male with known A. fib status post pacemaker, hyperlipidemia, diabetes, hypertension, on Xarelto. Patient was brought to the hospital after waking up yesterday and feeling sudden onset of vertigo. He also endorses generalized weakness. CT on 9/11 negative for acute infarct. Unable to obtain MRI due to pacemaker.      Clinical Impression   Pt reports he was managing BADL independently PTA. Currently pt requires min assist +2 for sit to stand from EOB with increased dizziness and unsteadiness noted. Pt presenting with dizziness in sit/stand, impaired vision, poor balance in sit/stand impacting his independence and safety with ADL and functional mobility. Assisted PT with basic vestibular eval during session. Pt planning to d/c home with support from family. Recommending HHOT for follow up to maximize independence and safety with ADL and functional mobility. Pt may progress to home without OT follow up. Pt would benefit from continued skilled OT to address established goals.    Follow Up Recommendations  Home health OT;Supervision/Assistance - 24 hour    Equipment Recommendations  None recommended by OT    Recommendations for Other Services       Precautions / Restrictions Precautions Precautions: Fall Restrictions Weight Bearing Restrictions: No      Mobility Bed Mobility Overal bed mobility: Needs Assistance Bed Mobility: Rolling;Sidelying to Sit;Sit to Supine Rolling: Min guard Sidelying to sit: Min assist   Sit to supine: Min assist   General bed mobility comments: Min assist for balance with when coming to sit. Steadying assist once sitting EOB.   Transfers Overall transfer level: Needs assistance Equipment used: 2 person hand held assist Transfers: Sit to/from Stand Sit to Stand: Min assist;+2 physical  assistance         General transfer comment: +2 for steadying due to balance deficits and dizziness.    Balance Overall balance assessment: Needs assistance;History of Falls Sitting-balance support: Feet supported;Bilateral upper extremity supported Sitting balance-Leahy Scale: Poor Sitting balance - Comments: Poor trunk control   Standing balance support: Bilateral upper extremity supported Standing balance-Leahy Scale: Poor Standing balance comment: External support provided in standing.                            ADL Overall ADL's : Needs assistance/impaired Eating/Feeding: Set up;Bed level   Grooming: Minimal assistance;Sitting   Upper Body Bathing: Minimal assitance;Sitting   Lower Body Bathing: Moderate assistance;+2 for physical assistance;Sit to/from stand   Upper Body Dressing : Minimal assistance;Sitting   Lower Body Dressing: Moderate assistance;Sit to/from stand;+2 for physical assistance               Functional mobility during ADLs: Maximal assistance;+2 for physical assistance (for sit to stand only) General ADL Comments: +2 assist for sit to stand due to dizziness and unsteadiness. Assisted PT with vestibular eval.     Vision Additional Comments: Horizontal nystagmus with head turns.   Perception     Praxis      Pertinent Vitals/Pain Pain Assessment: No/denies pain     Hand Dominance  (both)   Extremity/Trunk Assessment Upper Extremity Assessment Upper Extremity Assessment: Overall WFL for tasks assessed   Lower Extremity Assessment Lower Extremity Assessment: Defer to PT evaluation   Cervical / Trunk Assessment Cervical / Trunk Assessment: Kyphotic   Communication Communication Communication: HOH;Other (comment) (garbled speech)   Cognition Arousal/Alertness: Lethargic (  intermittently) Behavior During Therapy: Flat affect Overall Cognitive Status: Within Functional Limits for tasks assessed                      General Comments       Exercises       Shoulder Instructions      Home Living Family/patient expects to be discharged to:: Private residence Living Arrangements: Spouse/significant other Available Help at Discharge: Family Type of Home: House Home Access: Stairs to enter [employer] of Steps: 2-3   Home Layout: Two level;Able to live on main level with bedroom/bathroom (basement)     Bathroom Shower/Tub: Occupational psychologist: Handicapped height     Home Equipment: Shower seat;Grab bars - tub/shower;Cane - quad          Prior Functioning/Environment Level of Independence: Independent        Comments: Walks short distances (~30-40 feet). Uses cane for distance if needed. Drives. Hx of falls, increased falls recently.    OT Diagnosis: Generalized weakness;Disturbance of vision   OT Problem List: Decreased strength;Impaired balance (sitting and/or standing);Decreased activity tolerance;Impaired vision/perception;Decreased coordination;Decreased knowledge of use of DME or AE;Decreased knowledge of precautions;Obesity   OT Treatment/Interventions: Self-care/ADL training;Energy conservation;DME and/or AE instruction;Therapeutic activities;Patient/family education;Balance training    OT Goals(Current goals can be found in the care plan section) Acute Rehab OT Goals Patient Stated Goal: get better OT Goal Formulation: With patient/family Time For Goal Achievement: 05/20/16 Potential to Achieve Goals: Good ADL Goals Pt Will Perform Grooming: with supervision;standing Pt Will Perform Upper Body Bathing: with supervision;sitting Pt Will Perform Lower Body Bathing: with supervision;sit to/from stand Pt Will Transfer to Toilet: with supervision;ambulating;regular height toilet Pt Will Perform Toileting - Clothing Manipulation and hygiene: with supervision;sit to/from stand Pt Will Perform Tub/Shower Transfer: Shower transfer;with  supervision;ambulating;shower seat;rolling walker  OT Frequency: Min 2X/week   Barriers to D/C:            Co-evaluation PT/OT/SLP Co-Evaluation/Treatment: Yes Reason for Co-Treatment: Complexity of the patient's impairments (multi-system involvement);For patient/therapist safety   OT goals addressed during session: Other (comment);Strengthening/ROM (mobility)      End of Session Equipment Utilized During Treatment: Oxygen  Activity Tolerance: Other (comment) (limited by dizziness) Patient left: in bed;with call bell/phone within reach;with family/visitor present   Time: [id number] OT Time Calculation (min): 43 min Charges:  OT General Charges $OT Visit: 1 Procedure OT Evaluation $OT Eval Moderate Complexity: 1 Procedure G-Codes: OT G-codes **NOT FOR INPATIENT CLASS** Functional Assessment Tool Used: Clinical judgement Functional Limitation: Self care Self Care Current Status [id number]): At least 20 percent but less than 40 percent impaired, limited or restricted Self Care Goal Status [id number]): At least 1 percent but less than 20 percent impaired, limited or restricted   Binnie Kand M.S., OTR/L Pager: [phone]  05/06/2016, 4:06 PM

## 2016-05-06 NOTE — Evaluation (Signed)
Physical Therapy Evaluation Patient Details Name: Richard [name] MRN: [id number] DOB: 1930-11-05 Today's Date: 05/06/2016   History of Present Illness  82 y.o. male with known A. fib status post pacemaker, hyperlipidemia, diabetes, hypertension, on Xarelto. Patient was brought to the hospital after waking up yesterday and feeling sudden onset of vertigo. He also endorses generalized weakness. CT on 9/11 negative for acute infarct. Unable to obtain MRI due to pacemaker.     Clinical Impression  Pt admitted with/for dizziness and decreased balance, generalized weakness.  Pt currently limited functionally due to the problems listed. ( See problems list.)   Pt will benefit from PT to maximize function and safety in order to get ready for next venue listed below.     Follow Up Recommendations Outpatient PT    Equipment Recommendations  Other (comment) (TBA)    Recommendations for Other Services       Precautions / Restrictions Precautions Precautions: Fall Restrictions Weight Bearing Restrictions: No      Mobility  Bed Mobility Overal bed mobility: Needs Assistance Bed Mobility: Rolling;Sidelying to Sit;Sit to Supine Rolling: Min guard Sidelying to sit: Min assist   Sit to supine: Min assist   General bed mobility comments: Min assist for balance with when coming to sit. Steadying assist once sitting EOB.   Transfers Overall transfer level: Needs assistance Equipment used: 2 person hand held assist Transfers: Sit to/from Stand Sit to Stand: Min assist;+2 physical assistance         General transfer comment: +2 for steadying due to balance deficits and dizziness.  Ambulation/Gait             General Gait Details: not tested  Stairs            Wheelchair Mobility    Modified Rankin (Stroke Patients Only)       Balance Overall balance assessment: Needs assistance Sitting-balance support: Feet supported;Bilateral upper extremity supported Sitting  balance-Leahy Scale: Poor Sitting balance - Comments: Poor trunk control   Standing balance support: Bilateral upper extremity supported Standing balance-Leahy Scale: Poor Standing balance comment: External support provided in standing                             Pertinent Vitals/Pain Pain Assessment: No/denies pain    Home Living Family/patient expects to be discharged to:: Private residence Living Arrangements: Spouse/significant other Available Help at Discharge: Family Type of Home: House Home Access: Stairs to enter   [employer] of Steps: 2-3 Home Layout: Two level;Able to live on main level with bedroom/bathroom (basement) Home Equipment: Shower seat;Grab bars - tub/shower;Cane - quad      Prior Function Level of Independence: Independent         Comments: Walks short distances (~30-40 feet). Uses cane for distance if needed. Drives. Hx of falls, increased falls recently.     Hand Dominance   Dominant Hand:  (both)    Extremity/Trunk Assessment   Upper Extremity Assessment: Defer to OT evaluation           Lower Extremity Assessment: Overall WFL for tasks assessed      Cervical / Trunk Assessment: Kyphotic  Communication   Communication: HOH;Other (comment) (garbled speech)  Cognition Arousal/Alertness: Lethargic (intermittently) Behavior During Therapy: Flat affect Overall Cognitive Status: Within Functional Limits for tasks assessed                      General Comments General  comments (skin integrity, edema, etc.): Basic vestibular assessment--subjective answers to questions and ageotrophic nystagmus with head turn in bed lead me to suspect horizontal BPPV, (cupulo).  Tried the modified Semont as a potential treatment.    Exercises        Assessment/Plan    PT Assessment Patient needs continued PT services  PT Diagnosis Difficulty walking;Other (comment) (vertigo,  ?BPPV)   PT Problem List Decreased  activity tolerance;Decreased balance;Decreased mobility;Other (comment) (BPPV)  PT Treatment Interventions Gait training;Functional mobility training;Therapeutic activities;Balance training;Patient/family education (vestibular interventions)   PT Goals (Current goals can be found in the Care Plan section) Acute Rehab PT Goals Patient Stated Goal: get better PT Goal Formulation: With patient Time For Goal Achievement: 05/06/16 Potential to Achieve Goals: Good    Frequency Min 3X/week   Barriers to discharge        Co-evaluation PT/OT/SLP Co-Evaluation/Treatment: Yes Reason for Co-Treatment: Complexity of the patient's impairments (multi-system involvement) PT goals addressed during session: Mobility/safety with mobility OT goals addressed during session: Other (comment);Strengthening/ROM (mobility)       End of Session   Activity Tolerance: Patient tolerated treatment well Patient left: in bed;with call bell/phone within reach;with bed alarm set;with family/visitor present Nurse Communication: Mobility status    Functional Assessment Tool Used: clinical judgement Functional Limitation: Other PT primary (vestibular/vertigo) Other PT Primary Current Status [id number]): At least 20 percent but less than 40 percent impaired, limited or restricted Other PT Primary Goal Status [id number]): At least 1 percent but less than 20 percent impaired, limited or restricted    Time: [id number] PT Time Calculation (min) (ACUTE ONLY): 43 min   Charges:   PT Evaluation $PT Eval Moderate Complexity: 1 Procedure PT Treatments $Neuromuscular Re-education: 8-22 mins   PT G Codes:   PT G-Codes **NOT FOR INPATIENT CLASS** Functional Assessment Tool Used: clinical judgement Functional Limitation: Other PT primary (vestibular/vertigo) Other PT Primary Current Status [id number]): At least 20 percent but less than 40 percent impaired, limited or restricted Other PT Primary Goal Status [id number]): At least 1  percent but less than 20 percent impaired, limited or restricted    Richard [name], Richard [name] 05/06/2016, 5:04 PM 05/06/2016  Richard [name], PT [phone]  (pager)

## 2016-05-06 NOTE — Progress Notes (Signed)
SLP Cancellation Note  Patient Details Name: Richard [name] MRN: [id number] DOB: 1931/04/14   Cancelled treatment:        Pt out of room when SLP arrived for speech-language-cognitive assessment. Will continue efforts.   Houston Siren 05/06/2016, 1:58 PM   Orbie Pyo Colvin Caroli.Ed [employer] [phone]

## 2016-05-07 ENCOUNTER — Encounter (HOSPITAL_COMMUNITY): Payer: Self-pay | Admitting: *Deleted

## 2016-05-07 DIAGNOSIS — G4730 Sleep apnea, unspecified: Secondary | ICD-10-CM

## 2016-05-07 DIAGNOSIS — G4733 Obstructive sleep apnea (adult) (pediatric): Secondary | ICD-10-CM

## 2016-05-07 DIAGNOSIS — R4189 Other symptoms and signs involving cognitive functions and awareness: Secondary | ICD-10-CM

## 2016-05-07 DIAGNOSIS — E119 Type 2 diabetes mellitus without complications: Secondary | ICD-10-CM

## 2016-05-07 DIAGNOSIS — D509 Iron deficiency anemia, unspecified: Secondary | ICD-10-CM

## 2016-05-07 DIAGNOSIS — D519 Vitamin B12 deficiency anemia, unspecified: Secondary | ICD-10-CM

## 2016-05-07 DIAGNOSIS — E785 Hyperlipidemia, unspecified: Secondary | ICD-10-CM

## 2016-05-07 DIAGNOSIS — E1159 Type 2 diabetes mellitus with other circulatory complications: Secondary | ICD-10-CM

## 2016-05-07 DIAGNOSIS — K449 Diaphragmatic hernia without obstruction or gangrene: Secondary | ICD-10-CM

## 2016-05-07 DIAGNOSIS — Z950 Presence of cardiac pacemaker: Secondary | ICD-10-CM

## 2016-05-07 DIAGNOSIS — D62 Acute posthemorrhagic anemia: Secondary | ICD-10-CM

## 2016-05-07 DIAGNOSIS — I480 Paroxysmal atrial fibrillation: Secondary | ICD-10-CM

## 2016-05-07 DIAGNOSIS — I10 Essential (primary) hypertension: Secondary | ICD-10-CM

## 2016-05-07 DIAGNOSIS — I69319 Unspecified symptoms and signs involving cognitive functions following cerebral infarction: Secondary | ICD-10-CM

## 2016-05-07 DIAGNOSIS — K219 Gastro-esophageal reflux disease without esophagitis: Secondary | ICD-10-CM

## 2016-05-07 DIAGNOSIS — R42 Dizziness and giddiness: Secondary | ICD-10-CM

## 2016-05-07 DIAGNOSIS — I639 Cerebral infarction, unspecified: Secondary | ICD-10-CM | POA: Diagnosis present

## 2016-05-07 DIAGNOSIS — R531 Weakness: Secondary | ICD-10-CM

## 2016-05-07 DIAGNOSIS — D649 Anemia, unspecified: Secondary | ICD-10-CM

## 2016-05-07 DIAGNOSIS — IMO0002 Reserved for concepts with insufficient information to code with codable children: Secondary | ICD-10-CM

## 2016-05-07 DIAGNOSIS — Z7901 Long term (current) use of anticoagulants: Secondary | ICD-10-CM

## 2016-05-07 LAB — GLUCOSE, CAPILLARY
GLUCOSE-CAPILLARY: 108 mg/dL — AB (ref 65–99)
GLUCOSE-CAPILLARY: 105 mg/dL — AB (ref 65–99)
GLUCOSE-CAPILLARY: 141 mg/dL — AB (ref 65–99)
GLUCOSE-CAPILLARY: 119 mg/dL — AB (ref 65–99)

## 2016-05-07 LAB — HEMOGLOBIN A1C
Hgb A1c MFr Bld: 5.7 % — ABNORMAL HIGH (ref 4.8–5.6)
MEAN PLASMA GLUCOSE: 117 mg/dL

## 2016-05-07 MED ORDER — ASPIRIN EC 81 MG PO TBEC
81.0000 mg | DELAYED_RELEASE_TABLET | Freq: Every day | ORAL | Status: DC
  Administered 2016-05-07 – 2016-05-08 (×2): 81 mg via ORAL
  Filled 2016-05-07 (×2): qty 1

## 2016-05-07 NOTE — Progress Notes (Signed)
Rehab Admissions Coordinator Note:  Patient was screened by Cleatrice Burke for appropriateness for an Inpatient Acute Rehab Consult per PT change in recommendation.   At this time, we are recommending Inpatient Rehab consult. If dx felt to be CVA, would consider an inpt rehab consult for possible admission if pt and family would like to be considered.  Cleatrice Burke 05/07/2016, 1:57 PM  I can be reached at [phone].

## 2016-05-07 NOTE — Progress Notes (Signed)
PROGRESS NOTE    Richard [name]  [id number] DOB: February 23, 1931 DOA: 05/05/2016 PCP: Tivis Ringer, MD   Outpatient Specialists: Cardiology, Dr. Acie Fredrickson  Brief Narrative: Richard [name] a 82 y.o.malewith a history of complete heart block with PPM, OSA on CPAP, DM, anxiety, HLD, HTN, iron deficiency anemia, nephrolithiasis, and RMSF who presented 9/11 with generalized weakness and vertigo. Patient is a poor historian, but reports sudden onset of vertigo while bending downward with rotational L>R component associated with nausea. He's generally sedentary and has been more fatigued and felt diffusely weak for the past few days. Orthostatic vital signs were negative, CT scan unrevealing, and unfortunately his medtronic pacemaker is not compatible with MRI. Neurology has been consulted.   Assessment & Plan:   Active Problems:   HTN (hypertension)   Sleep apnea   Diabetes mellitus (HCC)   Hiatal hernia   Atrial fibrillation (HCC)   Anemia   GERD (gastroesophageal reflux disease)   Orthostatic hypotension   B12 deficiency anemia   Anemia, iron deficiency   Generalized weakness   Stroke-like symptoms   Cerebral thrombosis with cerebral infarction   Vertigo, likely cerebellar stroke rather than BPPV  - CT without acute hemorrhage, CTA head and neck negative, and MRI not possible due to pacemaker - Echo: mild LVH, EF 60-65%  - LDL 84: Lipitor  - HbA1c 5.8 - PT/OT: recommending CIR. PMR consult placed.  - Neurology/Stroke team: suspected small cerebellar infarct. Repeat CT in the morning. Start aspirin as well as continue xarelto.   Atrial fibrillation on xarelto - Continue metoprolol, amiodarone, xarelto - Tele   Third degree heart block s/p pacemaker    General weakness/deconditioning: Unknown chronicity in sedentary elderly male. Thought to be underlying but not primary cause of dizziness.  - PT evaluation: recommending CIR. PMR consult placed.   Essential  hypertension - Continue home medications - Monitor  [id number], without complications, non-insulin dependent  - Hold home oral diabetic medications - metformin, actos  - SSI - Heart healthy, carb modified diet - Ha1c pending   Anemia of chronic disease: Chronic and stable  - Follow up by dr. Whitney Muse   Depression - Continue Zoloft, trazodone   GERD - PPI   DVT prophylaxis: xarelto Code Status: Full Family Communication: Wife, Dorian Pod, at bedside  Disposition Plan: Await repeat CT in the morning. PMR consulted for CIR eval.   Consultants:   Neurology, Dr. Leonel Ramsay  Procedures:   Echo 9/12  Study Conclusions - Left ventricle: Abnormal septal motion The cavity size was normal. Wall thickness was increased in a pattern of mild LVH. Systolic function was normal. The estimated ejection fraction was in the range of 60% to 65%. Wall motion was normal; there were no regional wall motion abnormalities. - Mitral valve: There was mild regurgitation. Valve area by continuity equation (using LVOT flow): 3.11 cm^2. - Left atrium: The atrium was moderately dilated. - Right atrium: ? calcified chiari network in RA. - Atrial septum: No defect or patent foramen ovale was identified.  Antimicrobials:  None    Subjective: Patient seen sitting in chair. He states that his vertigo has improved since admission. He admits to double vision today. He has been working with physical therapy and was able to ambulate in the hallways, however, he continues to be very weak. He denies any chest pain, shortness of breath, nausea, vomiting, diarrhea, abdominal pain. He does have a condom catheter in place.  Objective: Vitals:   05/06/16 2132 05/07/16 0100 05/07/16 0611 05/07/16  0923  BP: (!) 173/79 (!) 155/79 (!) 149/72 (!) 165/72  Pulse: 68 69 77 65  Resp: 20 20 18 18   Temp: 97.9 F (36.6 C) 97.8 F (36.6 C) 98 F (36.7 C) 97.8 F (36.6 C)  TempSrc: Tympanic Oral Oral Oral  SpO2: 97% 98% 100%  97%  Weight:      Height:        Intake/Output Summary (Last 24 hours) at 05/07/16 1456 Last data filed at 05/07/16 0500  Gross per 24 hour  Intake              236 ml  Output              800 ml  Net             -564 ml   Filed Weights   05/05/16 2045  Weight: 113.2 kg (249 lb 9 oz)    Examination:  General exam: Appears calm and comfortable  Respiratory system: Clear to auscultation. Respiratory effort normal. Cardiovascular system: S1 & S2 heard, RRR. No JVD, murmurs, rubs, gallops or clicks. No pedal edema. Gastrointestinal system: Abdomen is nondistended, soft and nontender. No organomegaly or masses felt. Normal bowel sounds heard. Central nervous system: Alert and oriented. No focal neurological deficits. No facial droop, no aphasia. Finger-nose is abnormal bilaterally.  Extremities: Symmetric 5 x 5 power.  Skin: No rashes, lesions or ulcers Psychiatry: Judgement and insight appear normal. Mood & affect appropriate.   Data Reviewed: I have personally reviewed following labs and imaging studies  CBC:  Recent Labs Lab 05/05/16 1035  WBC 5.2  NEUTROABS 3.9  HGB 10.0*  HCT 33.2*  MCV 97.9  PLT [id number]*   Basic Metabolic Panel:  Recent Labs Lab 05/05/16 1035  NA 140  K 3.8  CL 103  CO2 26  GLUCOSE 188*  BUN 13  CREATININE 1.04  CALCIUM 8.9   GFR: Estimated Creatinine Clearance (by C-G formula based on SCr of 1.04 mg/dL) Male: 60.4 mL/min Male: 73.1 mL/min Liver Function Tests:  Recent Labs Lab 05/05/16 1035  AST 32  ALT 22  ALKPHOS 77  BILITOT 0.6  PROT 7.3  ALBUMIN 3.5   No results for input(s): LIPASE, AMYLASE in the last 168 hours. No results for input(s): AMMONIA in the last 168 hours. Coagulation Profile:  Recent Labs Lab 05/05/16 1634  INR 1.22   Cardiac Enzymes:  Recent Labs Lab 05/05/16 1634  TROPONINI <0.03   BNP (last 3 results) No results for input(s): PROBNP in the last 8760 hours. HbA1C:  Recent Labs   05/05/16 1634  HGBA1C 5.8*   CBG:  Recent Labs Lab 05/06/16 1135 05/06/16 1718 05/06/16 2131 05/07/16 0636 05/07/16 1142  GLUCAP 154* 133* 141* 108* 105*   Lipid Profile:  Recent Labs  05/06/16 0531  CHOL 136  HDL 38*  LDLCALC 84  TRIG 70  CHOLHDL 3.6   Thyroid Function Tests: No results for input(s): TSH, T4TOTAL, FREET4, T3FREE, THYROIDAB in the last 72 hours. Anemia Panel: No results for input(s): VITAMINB12, FOLATE, FERRITIN, TIBC, IRON, RETICCTPCT in the last 72 hours. Sepsis Labs: No results for input(s): PROCALCITON, LATICACIDVEN in the last 168 hours.  No results found for this or any previous visit (from the past 240 hour(s)).       Radiology Studies: Ct Angio Head W/cm &/or Wo Cm  Result Date: 05/05/2016 CLINICAL DATA:  82 y/o  M; acute dizziness and generalized weakness. EXAM: CT ANGIOGRAPHY HEAD AND NECK TECHNIQUE: Multidetector CT  imaging of the head and neck was performed using the standard protocol during bolus administration of intravenous contrast. Multiplanar CT image reconstructions and MIPs were obtained to evaluate the vascular anatomy. Carotid stenosis measurements (when applicable) are obtained utilizing NASCET criteria, using the distal internal carotid diameter as the denominator. CONTRAST:  75 cc Isovue 370 COMPARISON:  05/05/2016 CT head.  05/18/2013 MRI brain. FINDINGS: CTA NECK Aortic arch: Mild calcific atherosclerosis of the arch. Four vessel arch. Right carotid system: No occlusion, aneurysm, dissection, or significant stenosis is identified. Minimal mixed plaque at the bifurcation. Retropharyngeal course of common carotid artery. Left carotid system: No occlusion, aneurysm, dissection, or significant stenosis is identified. Minimal mixed plaque at the bifurcation. Retropharyngeal course of common carotid artery. Vertebral arteries:No occlusion, aneurysm, dissection, or significant stenosis is identified. Skeleton: Multilevel degenerative  changes of the cervical spine most pronounced at the C5 through C7 levels. No high-grade bony canal stenosis or foraminal narrowing. Other neck: Small thyroid nodules the largest in the right lower pole measuring 10 patent aerodigestive tract. Inflammatory calcifications of palatini tonsils. Fatty atrophy of the right parotid gland and calcifications probably represent sequelae of prior parotiditis. No lymphadenopathy or discrete cervical mass is identified. Left maxillary sinus aerosolized secretions, chronic inflammatory changes, and atelectasis. Otherwise visualized paranasal sinuses and mastoids are clear. Bilateral intra-ocular lens replacement. Partially visualized pacemaker. Upper chest: Peripheral reticular changes in the lung probably represents mild fibrosis. CTA HEAD Anterior circulation: Moderate calcific atherosclerosis of cavernous and paraclinoid internal carotid artery is bilateral middle cerebral arteries, anterior cerebral arteries, and internal carotid arteries are patent. Symmetric distal circulation. No occlusion, aneurysm, dissection, or significant stenosis is identified. Posterior circulation: Minimal calcification of the bilateral V4 segments. Bilateral vertebral arteries, the basilar artery, and posterior cerebral arteries are patent. Symmetric distal circulation. No occlusion, aneurysm, dissection, or significant stenosis is identified. Venous sinuses: No thrombus identified. Anatomic variants: Patent anterior communicating artery. Fetal left PCA. No right posterior communicating artery identified, hypoplastic or absent. Delayed phase: Small hyperdense focus within the right paramedian pons, dense on noncontrast CT, possibly mineralization related to old lacunar infarct. No definite abnormal enhancement. IMPRESSION: No occlusion, aneurysm, dissection, or significant stenosis of the circle of Willis or carotid and vertebral arteries of the neck is identified. Mild mixed plaque of the  carotid bifurcations and intracranial calcific atherosclerosis. Electronically Signed   By: Kristine Garbe M.D.   On: 05/05/2016 20:36   Ct Angio Neck W Or Wo Contrast  Result Date: 05/05/2016 CLINICAL DATA:  82 y/o  M; acute dizziness and generalized weakness. EXAM: CT ANGIOGRAPHY HEAD AND NECK TECHNIQUE: Multidetector CT imaging of the head and neck was performed using the standard protocol during bolus administration of intravenous contrast. Multiplanar CT image reconstructions and MIPs were obtained to evaluate the vascular anatomy. Carotid stenosis measurements (when applicable) are obtained utilizing NASCET criteria, using the distal internal carotid diameter as the denominator. CONTRAST:  75 cc Isovue 370 COMPARISON:  05/05/2016 CT head.  05/18/2013 MRI brain. FINDINGS: CTA NECK Aortic arch: Mild calcific atherosclerosis of the arch. Four vessel arch. Right carotid system: No occlusion, aneurysm, dissection, or significant stenosis is identified. Minimal mixed plaque at the bifurcation. Retropharyngeal course of common carotid artery. Left carotid system: No occlusion, aneurysm, dissection, or significant stenosis is identified. Minimal mixed plaque at the bifurcation. Retropharyngeal course of common carotid artery. Vertebral arteries:No occlusion, aneurysm, dissection, or significant stenosis is identified. Skeleton: Multilevel degenerative changes of the cervical spine most pronounced at the C5 through C7  levels. No high-grade bony canal stenosis or foraminal narrowing. Other neck: Small thyroid nodules the largest in the right lower pole measuring 10 patent aerodigestive tract. Inflammatory calcifications of palatini tonsils. Fatty atrophy of the right parotid gland and calcifications probably represent sequelae of prior parotiditis. No lymphadenopathy or discrete cervical mass is identified. Left maxillary sinus aerosolized secretions, chronic inflammatory changes, and atelectasis.  Otherwise visualized paranasal sinuses and mastoids are clear. Bilateral intra-ocular lens replacement. Partially visualized pacemaker. Upper chest: Peripheral reticular changes in the lung probably represents mild fibrosis. CTA HEAD Anterior circulation: Moderate calcific atherosclerosis of cavernous and paraclinoid internal carotid artery is bilateral middle cerebral arteries, anterior cerebral arteries, and internal carotid arteries are patent. Symmetric distal circulation. No occlusion, aneurysm, dissection, or significant stenosis is identified. Posterior circulation: Minimal calcification of the bilateral V4 segments. Bilateral vertebral arteries, the basilar artery, and posterior cerebral arteries are patent. Symmetric distal circulation. No occlusion, aneurysm, dissection, or significant stenosis is identified. Venous sinuses: No thrombus identified. Anatomic variants: Patent anterior communicating artery. Fetal left PCA. No right posterior communicating artery identified, hypoplastic or absent. Delayed phase: Small hyperdense focus within the right paramedian pons, dense on noncontrast CT, possibly mineralization related to old lacunar infarct. No definite abnormal enhancement. IMPRESSION: No occlusion, aneurysm, dissection, or significant stenosis of the circle of Willis or carotid and vertebral arteries of the neck is identified. Mild mixed plaque of the carotid bifurcations and intracranial calcific atherosclerosis. Electronically Signed   By: Kristine Garbe M.D.   On: 05/05/2016 20:36        Scheduled Meds: .  stroke: mapping our early stages of recovery book   Does not apply Once  . amiodarone  200 mg Oral Once per day on Mon Wed Fri  . atorvastatin  40 mg Oral q1800  . feeding supplement (ENSURE ENLIVE)  237 mL Oral TID BM  . insulin aspart  0-9 Units Subcutaneous TID WC  . metoprolol  50 mg Oral BID  . pantoprazole  40 mg Oral Daily  . rivaroxaban  20 mg Oral Q supper  .  sertraline  100 mg Oral Daily   Continuous Infusions:     LOS: 1 day    Time spent: 40 minutes     Dessa Phi, DO [facility] Pager [phone]  If 7PM-7AM, please contact night-coverage [email or web address] Password [email or web address] 05/07/2016, 2:56 PM

## 2016-05-07 NOTE — Progress Notes (Signed)
Physical Therapy Treatment Patient Details Name: Richard [name] MRN: [id number] DOB: [birth year] Today's Date: 05/07/2016    History of Present Illness 82 y.o. male with known A. fib status post pacemaker, hyperlipidemia, diabetes, hypertension, on Xarelto. Patient was brought to the hospital after waking up yesterday and feeling sudden onset of vertigo. He also endorses generalized weakness. CT on 9/11 negative for acute infarct. Unable to obtain MRI due to pacemaker.       PT Comments    Pt able to mobilize more today.  Very unsteady with gait using RW, needing heavier moderate assist at times due to ataxia/incoordination, L>R LE and trunk.  Follow Up Recommendations  CIR     Equipment Recommendations  Other (comment) (TBA)    Recommendations for Other Services Rehab consult     Precautions / Restrictions Precautions Precautions: Fall    Mobility  Bed Mobility Overal bed mobility: Needs Assistance Bed Mobility: Supine to Sit   Sidelying to sit: Min assist       General bed mobility comments: pt having some trouble getting fully upright without assist  Transfers     Transfers: Sit to/from Stand;Stand Pivot Transfers Sit to Stand: Min assist;Mod assist         General transfer comment: assist coming forward mod assist the lower down to the floor  Ambulation/Gait Ambulation/Gait assistance: Mod assist (needed +2 for safety and not available) Ambulation Distance (Feet): 50 Feet (additional 10x2 to/from [facility]) Assistive device: Rolling walker (2 wheeled) Gait Pattern/deviations: Step-through pattern;Ataxic;Scissoring;Shuffle   Gait velocity interpretation: Below normal speed for age/gender General Gait Details: mildly ataxic, left shuffled gait with incoordination and instability with RW.  Control needs to be exerted onto patient to help him stay under control with LE and trunk   Stairs            Wheelchair Mobility    Modified Rankin (Stroke Patients  Only) Modified Rankin (Stroke Patients Only) Pre-Morbid Rankin Score: No significant disability Modified Rankin: Moderately severe disability (MD's leaning more toward CVA from vestibular cause.)     Balance Overall balance assessment: Needs assistance Sitting-balance support: No upper extremity supported Sitting balance-Leahy Scale: Fair     Standing balance support: Bilateral upper extremity supported Standing balance-Leahy Scale: Poor Standing balance comment: support needed                    Cognition Arousal/Alertness: Awake/alert   Overall Cognitive Status: History of cognitive impairments - at baseline                      Exercises      General Comments General comments (skin integrity, edema, etc.): pt now with double vision which started 9/12 in the evening.        Pertinent Vitals/Pain Pain Assessment: No/denies pain    Home Living Family/patient expects to be discharged to:: Private residence                    Prior Function            PT Goals (current goals can now be found in the care plan section) Acute Rehab PT Goals PT Goal Formulation: With patient Time For Goal Achievement: 05/20/16 Potential to Achieve Goals: Good Progress towards PT goals: Progressing toward goals    Frequency  Min 3X/week    PT Plan Discharge plan needs to be updated    Co-evaluation  End of Session Equipment Utilized During Treatment: Gait belt Activity Tolerance: Patient tolerated treatment well Patient left: in chair;with call bell/phone within reach;with chair alarm set;with family/visitor present     Time: [id number] PT Time Calculation (min) (ACUTE ONLY): 39 min  Charges:  $Gait Training: 8-22 mins $Therapeutic Activity: 23-37 mins                    G Codes:      Raford Brissett, [name] 05/07/2016, 1:39 PM  05/07/2016  Donnella Sham, PT [phone]  (pager)

## 2016-05-07 NOTE — Progress Notes (Signed)
Responded to consult to create advanced directive after colleagues provided educational materials yesterday to patient and wife Richard [name]. Today patient's wife, daughter, and granddaughter were with him. Visited briefly with all. Patient asked me to talk with his wife about the advanced directive. The other two left. Richard [name] was fairly knowledgeable about some options available on the form, which we discussed further. She/ill speak to her husband about those when he is more awake, and will  request a chaplain whenever  decides to sign the forms. She advised she .memay want to complete an advanced directive for herself then too. Patient, Richard [name], and I clasped hands and prayed together (patient pressing my hand firmly).   05/05/16 2100  Advance Directives (For Healthcare)  Does patient have an advance directive? No  Would patient like information on creating an advanced directive? No - patient declined information

## 2016-05-07 NOTE — Evaluation (Signed)
Speech Language Pathology Evaluation Patient Details Name: Richard [name] MRN: [id number] DOB: 1930/09/01 Today's Date: 05/07/2016 Time: [id number] SLP Time Calculation (min) (ACUTE ONLY): 42 min  Problem List:  Patient Active Problem List   Diagnosis Date Noted  . Generalized weakness 05/05/2016  . Stroke-like symptoms 05/05/2016  . Diabetes mellitus with complication ([place])   . Dizziness   . Absolute anemia 02/08/2016  . Weakness 02/01/2016  . Gastric polyp 06/21/2015  . Anemia, iron deficiency   . GERD (gastroesophageal reflux disease)   . Orthostatic hypotension   . B12 deficiency anemia   . Hypertensive cardiovascular disease   . Bradycardia with less than 30 beats per minute 05/31/2014  . Complete heart block ([place]) 05/31/2014  . Syncope 05/31/2014  . Lactic acidosis 05/31/2014  . AKI (acute kidney injury) ([place]) 05/31/2014  . Anemia 05/31/2014  . Atrial fibrillation ([place]) 04/08/2013  . Diabetes mellitus ([place]) 04/06/2013  . Hiatal hernia 04/06/2013  . Chest pain 06/13/2011  . HTN (hypertension) 06/13/2011  . Sleep apnea 06/13/2011   Past Medical History:  Past Medical History:  Diagnosis Date  . Anxiety   . Arthritis   . Atrial fibrillation ([place])    a. Dx 03/2013, notes report atrial fibrillation/atrial flutter, placed on amiodarone. NSR in subsequent OV's.  . B12 deficiency anemia   . Chest pain    a. H/o CTA negative for PE 2012, normal cath 2005, normal nucs previously including 05/2012.  Marland Kitchen Complete heart block (HCC)    a. s/p Medtronic Adapta L model ADDRL 1 (serial number [id number]) pacemaker.  Marland Kitchen GERD (gastroesophageal reflux disease)   . Hiatal hernia   . Hyperlipidemia   . Hypertension   . Iron deficiency anemia   . LBBB (left bundle branch block)   . Microcytic anemia   . Nephrolithiasis   . On home oxygen therapy    a. 2L w/CPAP at night  . Orthostatic hypotension   . OSA on CPAP    cpap  . [place] spotted fever ~ 1945  . Sinus drainage    . Skin cancer of lip   . Type II diabetes mellitus ([place])    Past Surgical History:  Past Surgical History:  Procedure Laterality Date  . CARDIAC CATHETERIZATION     by Dr. Acie Fredrickson, January 24, 2004, that shows minimal coronary artery irregularities and normal left ventricular function  . CATARACT EXTRACTION W/ INTRAOCULAR LENS  IMPLANT, BILATERAL Bilateral   . ESOPHAGOGASTRODUODENOSCOPY (EGD) WITH PROPOFOL N/A 06/21/2015   Procedure: ESOPHAGOGASTRODUODENOSCOPY (EGD) WITH PROPOFOL;  Surgeon: Gatha Mayer, MD;  Location: [facility] ENDOSCOPY;  Service: Endoscopy;  Laterality: N/A;  . INGUINAL HERNIA REPAIR Right   . LUMBAR DISC SURGERY  ~ 1993  . PERMANENT PACEMAKER INSERTION N/A 06/03/2014   MDT Adapta L implanted by Dr Rayann Heman for syncope and transient AV block  . SKIN CANCER EXCISION     "lower lip" (04/06/2013)  . TEMPORARY PACEMAKER INSERTION N/A 05/31/2014   Procedure: TEMPORARY WIRE;  Surgeon: Sinclair Grooms, MD;  Location: [facility] CATH LAB;  Service: Cardiovascular;  Laterality: N/A;  . VASECTOMY     Hx of    HPI:  82 y.o.malewith medical history significant for HTN, HLD, Afib on Xarelto, B-12 deficiency, GERD, hiatal hernia, hyperlipidemia, hypertension, iron deficiency anemia, pacemaker placed due to CHB,orthostatic hypotension, OSA on CPAP,diabetes non-insulin-dependent, presenting to the emergency department with acute onset of dizziness and generalized weakness. CT evidence of progressed chronic small vessel disease in the left thalamus  since 2015. Underlying chronic white matter disease, no acute intracranial abnormality identified. Pt underwent imaging studies for memory and abnormal movements in 2014.   Speech eval ordered.    Assessment / Plan / Recommendation Clinical Impression  Pt presents with baseline cognitive linguistic deficits and dysarthria per pt/wife.  His wife manages all home duties per pt/wife.  Portions of MOCA administered (did not administer items requiring vision due  to pt's double vision) with deficits in areas of memory, language and abstract thought.  Pt scoring on MOCA shows severe cognitive deficits.  Wife reports pt has always "been difficult to understand" and tends to not provide enough detail when telling narratives, etc.    SLP noted pt with dysarthria but pt is amenable to cues to slow rate and overarticulate.    Educated pt and wife to findings of [place] and advised wife to continue to monitor pt's medications (as he fills his own containers).  Also advised to compensation for pt's hearing loss and wife's soft voice including environmental modifications, etc.   No SLP follow up indicated as pt/family educated and pt is at baseline function.        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)   Cognition  Overall Cognitive Status: History of cognitive impairments - at baseline Arousal/Alertness: Awake/alert Orientation Level: Oriented to person;Oriented to place;Oriented to situation (pt stated he could not see the clock, it was double ) Attention: Sustained Sustained Attention: Appears intact Memory: Impaired Memory Impairment: Retrieval deficit (recalled 1/5 words independently, 1/5 with category cue, 3/5 with multiple choice cue) Awareness: Appears intact (aware of premorbid memory and new vision deficits) Problem Solving: Appears intact (looking for call bell in his bed when could not locate) Safety/Judgment: Appears intact (need to call for assistance) Comments: pt acknowledges wife  checks his medications to assure he is taking properly -     Comprehension  Auditory Comprehension Overall Auditory Comprehension: Appears within functional limits for tasks assessed Yes/No Questions: Not tested Commands: Within Functional Limits Conversation: Simple Interfering Components: Attention;Hearing;Working Field seismologist: Conservation officer, nature: Not tested (pt with dizziness/vision deficits) Reading Comprehension Reading Status: Not tested    Expression Expression Primary Mode of Expression: Verbal Verbal Expression Overall Verbal Expression: Appears within functional limits for tasks assessed Initiation: No impairment Repetition: Impaired Level of Impairment: Sentence level Naming: Not tested Pragmatics: No impairment Interfering Components: Attention;Speech intelligibility Written Expression Dominant Hand: Right (ambidexterous per pt) Written Expression: Not tested   Oral / Motor  Oral Motor/Sensory Function Overall Oral Motor/Sensory Function: Within functional limits Motor Speech Respiration: Within functional limits Phonation: Normal Resonance: Within functional limits Articulation: Impaired Level of Impairment: Sentence Intelligibility: Intelligibility reduced Word: 75-100% accurate Phrase: 75-100% accurate Sentence: 50-74% accurate Conversation: 50-74% accurate Motor Planning: Witnin functional limits Motor Speech Errors: Not applicable Interfering Components: Premorbid status Effective Techniques: Slow rate;Over-articulate   GO                    Luanna Salk, [facility] SLP [phone]

## 2016-05-07 NOTE — Progress Notes (Signed)
STROKE TEAM PROGRESS NOTE   SUBJECTIVE (INTERVAL HISTORY) His wife, daughter, and granddaughter are at the bedside.  Overall he feels his condition is much improved. He was sitting in the bed for lunch, and denies any vertigo, has been working with PT. Stated that he had diplopia for the last 2 days, now much improved also. He also had vertigo for last 2 days, with nausea and dry heaves, vertigo continuously, however much improved today.   OBJECTIVE Temp:  [97.8 F (36.6 C)-98 F (36.7 C)] 98 F (36.7 C) (09/13 1421) Pulse Rate:  [65-87] 68 (09/13 1421) Cardiac Rhythm: Heart block;Bundle branch block (09/13 0830) Resp:  [18-20] 20 (09/13 1421) BP: (149-173)/(72-79) 158/76 (09/13 1421) SpO2:  [95 %-100 %] 96 % (09/13 1421)   Recent Labs Lab 05/06/16 1718 05/06/16 2131 05/07/16 0636 05/07/16 1142 05/07/16 1607  GLUCAP 133* 141* 108* 105* 141*    Recent Labs Lab 05/05/16 1035  NA 140  K 3.8  CL 103  CO2 26  GLUCOSE 188*  BUN 13  CREATININE 1.04  CALCIUM 8.9    Recent Labs Lab 05/05/16 1035  AST 32  ALT 22  ALKPHOS 77  BILITOT 0.6  PROT 7.3  ALBUMIN 3.5    Recent Labs Lab 05/05/16 1035  WBC 5.2  NEUTROABS 3.9  HGB 10.0*  HCT 33.2*  MCV 97.9  PLT 145*    Recent Labs Lab 05/05/16 1634  TROPONINI <0.03    Recent Labs  05/05/16 1634  LABPROT 15.5*  INR 1.22    Recent Labs  05/05/16 1118  COLORURINE YELLOW  LABSPEC 1.025  PHURINE 6.0  GLUCOSEU 250*  HGBUR MODERATE*  BILIRUBINUR NEGATIVE  KETONESUR NEGATIVE  PROTEINUR 100*  NITRITE NEGATIVE  LEUKOCYTESUR NEGATIVE       Component Value Date/Time   CHOL 136 05/06/2016 0531   TRIG 70 05/06/2016 0531   HDL 38 (L) 05/06/2016 0531   CHOLHDL 3.6 05/06/2016 0531   VLDL 14 05/06/2016 0531   LDLCALC 84 05/06/2016 0531   Lab Results  Component Value Date   HGBA1C 5.7 (H) 05/06/2016      Component Value Date/Time   LABOPIA NONE DETECTED 05/05/2016 1118   COCAINSCRNUR NONE DETECTED  05/05/2016 1118   LABBENZ POSITIVE (A) 05/05/2016 1118   AMPHETMU NONE DETECTED 05/05/2016 1118   THCU NONE DETECTED 05/05/2016 1118   LABBARB NONE DETECTED 05/05/2016 1118    No results for input(s): ETH in the last 168 hours.  I have personally reviewed the radiological images below and agree with the radiology interpretations.  Ct Angio Head And neck W/cm &/or Wo Cm 05/05/2016 IMPRESSION: No occlusion, aneurysm, dissection, or significant stenosis of the circle of Willis or carotid and vertebral arteries of the neck is identified. Mild mixed plaque of the carotid bifurcations and intracranial calcific atherosclerosis.   Ct Head Wo Contrast 05/05/2016 IMPRESSION: 1. Evidence of progressed chronic small vessel disease in the left thalamus since 2015. Underlying chronic white matter disease. 2.  No acute intracranial abnormality identified.   Repeat CT head pending  2D Echocardiogram  - Left ventricle: Abnormal septal motion The cavity size was   normal. Wall thickness was increased in a pattern of mild LVH.   Systolic function was normal. The estimated ejection fraction was   in the range of 60% to 65%. Wall motion was normal; there were no   regional wall motion abnormalities. - Mitral valve: There was mild regurgitation. Valve area by   continuity equation (using LVOT flow):  3.11 cm^2. - Left atrium: The atrium was moderately dilated. - Right atrium: ? calcified chiari network in RA. - Atrial septum: No defect or patent foramen ovale was identified.  EKG  sinus rhythm   PHYSICAL EXAM  Temp:  [97.8 F (36.6 C)-98 F (36.7 C)] 98 F (36.7 C) (09/13 1421) Pulse Rate:  [65-87] 68 (09/13 1421) Resp:  [18-20] 20 (09/13 1421) BP: (149-173)/(72-79) 158/76 (09/13 1421) SpO2:  [95 %-100 %] 96 % (09/13 1421)  General - obese, well developed, in no apparent distress.  Ophthalmologic - fundi not visualized due to eye movement.  Cardiovascular - Regular rate and rhythm with no  murmur, not in A. fib.  Mental Status -  Level of arousal and orientation to time, place, and person were intact. Language including expression, naming, repetition, comprehension was assessed and found intact. Fund of Knowledge was assessed and was impaired.  Cranial Nerves II - XII - II - Visual field intact OU. III, IV, VI - Extraocular movements intact. V - Facial sensation intact bilaterally. VII - Facial movement intact bilaterally. VIII - Hearing & vestibular intact bilaterally, no nystagmus. X - Palate elevates symmetrically. XI - Chin turning & shoulder shrug intact bilaterally. XII - Tongue protrusion intact.  Motor Strength - The patient's strength was 4/5 in all extremities and pronator drift was absent.  Bulk was normal and fasciculations were absent.   Motor Tone - Muscle tone was assessed at the neck and appendages and was normal.  Reflexes - The patient's reflexes were symmetrical in all extremities and he had no pathological reflexes.  Sensory - Light touch, temperature/pinprick were assessed and were symmetrical.    Coordination - The patient had normal movements in the hands with no ataxia or dysmetria.  Tremor was absent.  Gait and Station - deferred to PT.   ASSESSMENT/PLAN Richard [name] is a 82 y.o. male with history of A. fib on Xarelto, complete heart block s/p pacemaker, HLD, HTN, OSA on CPAP, DM admitted for acute onset vertigo and diplopia. Symptoms near resolved.    Stroke:  Likely posterior circulation small infarct likely secondary to small vessel disease source. However, A. fib as the etiology of stroke cannot be excluded.  MRI/MRA not able to perform due to pacer  CTA head and neck unremarkable  2D Echo  EF 60-65%  LDL 97  HgbA1c 5.7  Xarelto for VTE prophylaxis  Diet heart healthy/carb modified Room service appropriate? Yes; Fluid consistency: Thin   Xarelto (rivaroxaban) daily prior to admission, now on Xarelto (rivaroxaban)  daily. Recommend addition of baby aspirin for stroke prevention.  Patient counseled to be compliant with his antithrombotic medications  Ongoing aggressive stroke risk factor management  Therapy recommendations:  Pending  Disposition:  Pending  Atrial fibrillation  On Xarelto  Patient stated compliance with Xarelto  Diabetes  HgbA1c 5.7, goal < 7.0  Controlled  Home meds, metformin  CBG monitoring  SSI  Hypertension  Home meds:   Amlodipine, Lotensin, metoprolol Gradually normalized BP within 5-7 days. Currently on metoprolol  Stable  Patient counseled to be compliant with his blood pressure medications  Hyperlipidemia  Home meds:  None   LDL 97, goal < 70  Add Lipitor 40  Continue statin at discharge  Other Stroke Risk Factors  Advanced age  Obesity, Body mass index is 31.19 kg/m.   Obstructive sleep apnea, on CPAP at home  Other Active Problems  Complete heart block on pacemaker  Mild anemia  Other Pertinent  History    Hospital day # 1  Rosalin Hawking, MD PhD Stroke Neurology 05/07/2016 5:37 PM    To contact Stroke Continuity provider, please refer to [email or web address] After hours, contact General Neurology

## 2016-05-07 NOTE — Consult Note (Signed)
Physical Medicine and Rehabilitation Consult   Reason for Consult: Dizziness and balance deficits Referring Physician: Dr. Maylene Roes   HPI: Richard [name] is a 82 y.o. male with history of A fib-on xarelto, B 12 deficiency, T2DM, HTN, OSA,  CHB s/p PPM, cognitive impairements who was admitted on 05/05/16 with complaints of weakness, vertigo and double vision.  CT head negative for acute changes. CTA head/neck done revealing no occlusions, dissection or significant stenosis. Patient with nystagmus and RUE/RLE dysmetria felt to be due to small cerebellar stroke. Neurology recommended continuing Xarelto. Speech therapy evaluation revealed baseline dysarthria and severe cognitive deficits.  Therapy ongoing and patient with balance deficits , dizziness and nystagmus with head turn. CIR recommended for follow up therapy.    Review of Systems  Constitutional: Negative for malaise/fatigue.  HENT: Positive for hearing loss.   Eyes: Positive for double vision (on and off?).  Respiratory: Negative for cough and shortness of breath.   Cardiovascular: Negative for chest pain and palpitations.  Gastrointestinal: Negative for abdominal pain, heartburn and nausea.  Genitourinary: Negative for dysuria and urgency.  Musculoskeletal: Positive for back pain and falls. Negative for myalgias.       Has had problems walking since last December.  Neurological: Positive for speech change. Negative for dizziness (resolved).  Psychiatric/Behavioral: Positive for memory loss. The patient is not nervous/anxious.   All other systems reviewed and are negative.   Past Medical History:  Diagnosis Date  . Anxiety   . Arthritis   . Atrial fibrillation ([place])    a. Dx 03/2013, notes report atrial fibrillation/atrial flutter, placed on amiodarone. NSR in subsequent OV's.  . B12 deficiency anemia   . Chest pain    a. H/o CTA negative for PE 2012, normal cath 2005, normal nucs previously including 05/2012.  Marland Kitchen  Complete heart block ([facility])    a. s/p Medtronic Adapta L model ADDRL 1 (serial number [id number]) pacemaker.  Marland Kitchen GERD (gastroesophageal reflux disease)   . Hiatal hernia   . Hyperlipidemia   . Hypertension   . Iron deficiency anemia   . LBBB (left bundle branch block)   . Microcytic anemia   . Nephrolithiasis   . On home oxygen therapy    a. 2L w/CPAP at night  . Orthostatic hypotension   . OSA on CPAP    cpap  . [place] spotted fever ~ 1945  . Sinus drainage   . Skin cancer of lip   . Type II diabetes mellitus ([place])     Past Surgical History:  Procedure Laterality Date  . CARDIAC CATHETERIZATION     by Dr. Acie Fredrickson, January 24, 2004, that shows minimal coronary artery irregularities and normal left ventricular function  . CATARACT EXTRACTION W/ INTRAOCULAR LENS  IMPLANT, BILATERAL Bilateral   . ESOPHAGOGASTRODUODENOSCOPY (EGD) WITH PROPOFOL N/A 06/21/2015   Procedure: ESOPHAGOGASTRODUODENOSCOPY (EGD) WITH PROPOFOL;  Surgeon: Gatha Mayer, MD;  Location: [facility] ENDOSCOPY;  Service: Endoscopy;  Laterality: N/A;  . INGUINAL HERNIA REPAIR Right   . LUMBAR DISC SURGERY  ~ 1993  . PERMANENT PACEMAKER INSERTION N/A 06/03/2014   MDT Adapta L implanted by Dr Rayann Heman for syncope and transient AV block  . SKIN CANCER EXCISION     "lower lip" (04/06/2013)  . TEMPORARY PACEMAKER INSERTION N/A 05/31/2014   Procedure: TEMPORARY WIRE;  Surgeon: Sinclair Grooms, MD;  Location: [facility] CATH LAB;  Service: Cardiovascular;  Laterality: N/A;  . VASECTOMY     Hx of  Family History  Problem Relation Age of Onset  . Thyroid disease Mother     goiter  . Pneumonia Father   . Other      Parents both died of old age; other conditions not known  . Stroke Brother   . Breast cancer Sister     Social History:  Married. Independent PTA.  Per reports that he has quit smoking. His smoking use included Cigarettes. He smoked 1.00 packs per day for 20.00 years. He has never used smokeless tobacco. He  reports that he does not drink alcohol or use drugs.    Allergies  Allergen Reactions  . Codeine Other (See Comments)    Hallucinations   . Citalopram Swelling and Rash    Medications Prior to Admission  Medication Sig Dispense Refill  . acetaminophen (TYLENOL) 500 MG tablet Take 500 mg by mouth 2 (two) times daily.     Marland Kitchen amiodarone (PACERONE) 200 MG tablet Take 200 mg by mouth 3 (three) times a week. On Monday, Wednesday & Friday    . amLODipine (NORVASC) 2.5 MG tablet Take 1 tablet (2.5 mg total) by mouth daily. 30 tablet 11  . benazepril (LOTENSIN) 40 MG tablet Take 40 mg by mouth daily.      . cyanocobalamin (,VITAMIN B-12,) 1000 MCG/ML injection Inject 1,000 mcg into the muscle every 30 (thirty) days.    . diazepam (VALIUM) 5 MG tablet Take 5 mg by mouth at bedtime.      . fluticasone (FLONASE) 50 MCG/ACT nasal spray Place 1 spray into the nose daily as needed for allergies or rhinitis.     . metFORMIN (GLUCOPHAGE) 1000 MG tablet Take 1,000 mg by mouth 2 (two) times daily with a meal.    . metoprolol (LOPRESSOR) 50 MG tablet TAKE 1 TABLET BY MOUTH TWICE A DAY 60 tablet 11  . omeprazole (PRILOSEC) 20 MG capsule Take 20 mg by mouth daily.     . OXYGEN Inhale into the lungs at bedtime. CPAP with OXYGEN    . pioglitazone (ACTOS) 30 MG tablet Take 30 mg by mouth daily.      . sertraline (ZOLOFT) 100 MG tablet Take 1 tablet by mouth daily.    Alveda Reasons 20 MG TABS tablet TAKE 1 TABLET (20 MG TOTAL) BY MOUTH DAILY WITH SUPPER. 30 tablet 11    Home: Home Living Family/patient expects to be discharged to:: Private residence Living Arrangements: Spouse/significant other Available Help at Discharge: Family Type of Home: House Home Access: Stairs to enter Technical brewer of Steps: 2-3 Home Layout: Two level, Able to live on main level with bedroom/bathroom (basement) Bathroom Shower/Tub: Multimedia programmer: Handicapped height Home Equipment: Civil engineer, contracting, Grab bars -  tub/shower, Radio producer - quad  Lives With: Spouse  Functional History: Prior Function Level of Independence: Independent Comments: Walks short distances (~30-40 feet). Uses cane for distance if needed. Drives. Hx of falls, increased falls recently. Functional Status:  Mobility: Bed Mobility Overal bed mobility: Needs Assistance Bed Mobility: Supine to Sit Rolling: Min guard Sidelying to sit: Min assist Sit to supine: Min assist General bed mobility comments: pt having some trouble getting fully upright without assist Transfers Overall transfer level: Needs assistance Equipment used: 2 person hand held assist Transfers: Sit to/from Stand, Stand Pivot Transfers Sit to Stand: Min assist, Mod assist General transfer comment: assist coming forward mod assist the lower down to the floor Ambulation/Gait Ambulation/Gait assistance: Mod assist (needed +2 for safety and not available) Ambulation Distance (Feet):  50 Feet (additional 10x2 to/from [facility]) Assistive device: Rolling walker (2 wheeled) Gait Pattern/deviations: Step-through pattern, Ataxic, Scissoring, Shuffle General Gait Details: mildly ataxic, left shuffled gait with incoordination and instability with RW.  Control needs to be exerted onto patient to help him stay under control with LE and trunk Gait velocity interpretation: Below normal speed for age/gender    ADL: ADL Overall ADL's : Needs assistance/impaired Eating/Feeding: Set up, Bed level Grooming: Minimal assistance, Sitting Upper Body Bathing: Minimal assitance, Sitting Lower Body Bathing: Moderate assistance, +2 for physical assistance, Sit to/from stand Upper Body Dressing : Minimal assistance, Sitting Lower Body Dressing: Moderate assistance, Sit to/from stand, +2 for physical assistance Functional mobility during ADLs: Maximal assistance, +2 for physical assistance (for sit to stand only) General ADL Comments: +2 assist for sit to stand due to dizziness and unsteadiness.  Assisted PT with vestibular eval.  Cognition: Cognition Overall Cognitive Status: History of cognitive impairments - at baseline Arousal/Alertness: Awake/alert Orientation Level: Oriented X4 Attention: Sustained Sustained Attention: Appears intact Memory: Impaired Memory Impairment: Retrieval deficit (recalled 1/5 words independently, 1/5 with category cue, 3/5 with multiple choice cue) Awareness: Appears intact (aware of premorbid memory and new vision deficits) Problem Solving: Appears intact (looking for call bell in his bed when could not locate) Safety/Judgment: Appears intact (need to call for assistance) Comments: pt acknowledges wife checks his medications to assure he is taking properly -  Cognition Arousal/Alertness: Awake/alert Behavior During Therapy: Flat affect Overall Cognitive Status: History of cognitive impairments - at baseline  Blood pressure (!) 165/72, pulse 65, temperature 97.8 F (36.6 C), temperature source Oral, resp. rate 18, height 6\' 3"  (1.905 m), weight 113.2 kg (249 lb 9 oz), SpO2 97 %. Physical Exam  Nursing note and vitals reviewed. Constitutional: He appears well-developed and well-nourished.  HENT:  Head: Normocephalic and atraumatic.  Eyes: Conjunctivae and EOM are normal. Pupils are equal, round, and reactive to light.  Neck: Normal range of motion. Neck supple.  Cardiovascular: Normal rate and regular rhythm.   Respiratory: Effort normal and breath sounds normal. No stridor.  GI: Soft. Bowel sounds are normal. He exhibits no distension. There is no tenderness.  Musculoskeletal: He exhibits no edema or tenderness.  Neurological: He is alert.  Slightly confused Left facial weakness with moderate dysarthria.   Very HOH. Able to follow basic one and two step commands without difficulty.   Motor: 4+/5 throughout Ataxia L>R, no dymetria. Decrease in fine motor control BUE.  Sensory intact to light touch.  Proprioception intact.   Skin: Skin  is warm and dry.  Psychiatric: He has a normal mood and affect. His behavior is normal.    Results for orders placed or performed during the hospital encounter of 05/05/16 (from the past 24 hour(s))  Glucose, capillary     Status: Abnormal   Collection Time: 05/06/16  5:18 PM  Result Value Ref Range   Glucose-Capillary 133 (H) 65 - 99 mg/dL  Glucose, capillary     Status: Abnormal   Collection Time: 05/06/16  9:31 PM  Result Value Ref Range   Glucose-Capillary 141 (H) 65 - 99 mg/dL   Comment 1 Notify RN    Comment 2 Document in Chart   Glucose, capillary     Status: Abnormal   Collection Time: 05/07/16  6:36 AM  Result Value Ref Range   Glucose-Capillary 108 (H) 65 - 99 mg/dL   Comment 1 Notify RN    Comment 2 Document in Chart   Glucose, capillary  Status: Abnormal   Collection Time: 05/07/16 11:42 AM  Result Value Ref Range   Glucose-Capillary 105 (H) 65 - 99 mg/dL   Ct Angio Head W/cm &/or Wo Cm  Result Date: 05/05/2016 CLINICAL DATA:  82 y/o  M; acute dizziness and generalized weakness. EXAM: CT ANGIOGRAPHY HEAD AND NECK TECHNIQUE: Multidetector CT imaging of the head and neck was performed using the standard protocol during bolus administration of intravenous contrast. Multiplanar CT image reconstructions and MIPs were obtained to evaluate the vascular anatomy. Carotid stenosis measurements (when applicable) are obtained utilizing NASCET criteria, using the distal internal carotid diameter as the denominator. CONTRAST:  75 cc Isovue 370 COMPARISON:  05/05/2016 CT head.  05/18/2013 MRI brain. FINDINGS: CTA NECK Aortic arch: Mild calcific atherosclerosis of the arch. Four vessel arch. Right carotid system: No occlusion, aneurysm, dissection, or significant stenosis is identified. Minimal mixed plaque at the bifurcation. Retropharyngeal course of common carotid artery. Left carotid system: No occlusion, aneurysm, dissection, or significant stenosis is identified. Minimal mixed plaque  at the bifurcation. Retropharyngeal course of common carotid artery. Vertebral arteries:No occlusion, aneurysm, dissection, or significant stenosis is identified. Skeleton: Multilevel degenerative changes of the cervical spine most pronounced at the C5 through C7 levels. No high-grade bony canal stenosis or foraminal narrowing. Other neck: Small thyroid nodules the largest in the right lower pole measuring 10 patent aerodigestive tract. Inflammatory calcifications of palatini tonsils. Fatty atrophy of the right parotid gland and calcifications probably represent sequelae of prior parotiditis. No lymphadenopathy or discrete cervical mass is identified. Left maxillary sinus aerosolized secretions, chronic inflammatory changes, and atelectasis. Otherwise visualized paranasal sinuses and mastoids are clear. Bilateral intra-ocular lens replacement. Partially visualized pacemaker. Upper chest: Peripheral reticular changes in the lung probably represents mild fibrosis. CTA HEAD Anterior circulation: Moderate calcific atherosclerosis of cavernous and paraclinoid internal carotid artery is bilateral middle cerebral arteries, anterior cerebral arteries, and internal carotid arteries are patent. Symmetric distal circulation. No occlusion, aneurysm, dissection, or significant stenosis is identified. Posterior circulation: Minimal calcification of the bilateral V4 segments. Bilateral vertebral arteries, the basilar artery, and posterior cerebral arteries are patent. Symmetric distal circulation. No occlusion, aneurysm, dissection, or significant stenosis is identified. Venous sinuses: No thrombus identified. Anatomic variants: Patent anterior communicating artery. Fetal left PCA. No right posterior communicating artery identified, hypoplastic or absent. Delayed phase: Small hyperdense focus within the right paramedian pons, dense on noncontrast CT, possibly mineralization related to old lacunar infarct. No definite abnormal  enhancement. IMPRESSION: No occlusion, aneurysm, dissection, or significant stenosis of the circle of Willis or carotid and vertebral arteries of the neck is identified. Mild mixed plaque of the carotid bifurcations and intracranial calcific atherosclerosis. Electronically Signed   By: Kristine Garbe M.D.   On: 05/05/2016 20:36   Ct Angio Neck W Or Wo Contrast  Result Date: 05/05/2016 CLINICAL DATA:  82 y/o  M; acute dizziness and generalized weakness. EXAM: CT ANGIOGRAPHY HEAD AND NECK TECHNIQUE: Multidetector CT imaging of the head and neck was performed using the standard protocol during bolus administration of intravenous contrast. Multiplanar CT image reconstructions and MIPs were obtained to evaluate the vascular anatomy. Carotid stenosis measurements (when applicable) are obtained utilizing NASCET criteria, using the distal internal carotid diameter as the denominator. CONTRAST:  75 cc Isovue 370 COMPARISON:  05/05/2016 CT head.  05/18/2013 MRI brain. FINDINGS: CTA NECK Aortic arch: Mild calcific atherosclerosis of the arch. Four vessel arch. Right carotid system: No occlusion, aneurysm, dissection, or significant stenosis is identified. Minimal mixed plaque at  the bifurcation. Retropharyngeal course of common carotid artery. Left carotid system: No occlusion, aneurysm, dissection, or significant stenosis is identified. Minimal mixed plaque at the bifurcation. Retropharyngeal course of common carotid artery. Vertebral arteries:No occlusion, aneurysm, dissection, or significant stenosis is identified. Skeleton: Multilevel degenerative changes of the cervical spine most pronounced at the C5 through C7 levels. No high-grade bony canal stenosis or foraminal narrowing. Other neck: Small thyroid nodules the largest in the right lower pole measuring 10 patent aerodigestive tract. Inflammatory calcifications of palatini tonsils. Fatty atrophy of the right parotid gland and calcifications probably  represent sequelae of prior parotiditis. No lymphadenopathy or discrete cervical mass is identified. Left maxillary sinus aerosolized secretions, chronic inflammatory changes, and atelectasis. Otherwise visualized paranasal sinuses and mastoids are clear. Bilateral intra-ocular lens replacement. Partially visualized pacemaker. Upper chest: Peripheral reticular changes in the lung probably represents mild fibrosis. CTA HEAD Anterior circulation: Moderate calcific atherosclerosis of cavernous and paraclinoid internal carotid artery is bilateral middle cerebral arteries, anterior cerebral arteries, and internal carotid arteries are patent. Symmetric distal circulation. No occlusion, aneurysm, dissection, or significant stenosis is identified. Posterior circulation: Minimal calcification of the bilateral V4 segments. Bilateral vertebral arteries, the basilar artery, and posterior cerebral arteries are patent. Symmetric distal circulation. No occlusion, aneurysm, dissection, or significant stenosis is identified. Venous sinuses: No thrombus identified. Anatomic variants: Patent anterior communicating artery. Fetal left PCA. No right posterior communicating artery identified, hypoplastic or absent. Delayed phase: Small hyperdense focus within the right paramedian pons, dense on noncontrast CT, possibly mineralization related to old lacunar infarct. No definite abnormal enhancement. IMPRESSION: No occlusion, aneurysm, dissection, or significant stenosis of the circle of Willis or carotid and vertebral arteries of the neck is identified. Mild mixed plaque of the carotid bifurcations and intracranial calcific atherosclerosis. Electronically Signed   By: Kristine Garbe M.D.   On: 05/05/2016 20:36    Assessment/Plan: Diagnosis: small cerebellar stroke Labs and images independently reviewed.  Records reviewed and summated above. Stroke: Continue secondary stroke prophylaxis and Risk Factor Modification listed  below:   Antiplatelet therapy:   Blood Pressure Management:  Continue current medication with prn's with permisive HTN per primary team Statin Agent:   Diabetes management:    1. Does the need for close, 24 hr/day medical supervision in concert with the patient's rehab needs make it unreasonable for this patient to be served in a less intensive setting? Yes 2. Co-Morbidities requiring supervision/potential complications: A fib-(cont meds, monitor HR with increased activity), B 12 deficiency (supplement), T2 DM (Monitor in accordance with exercise and adjust meds as necessary), HTN (monitor and provide prns in accordance with increased physical exertion and pain), OSA (monitor for daytime somnolence, CPAP)  CHB s/p PPM (monitor HR), cognitive impairements (SLP), ABLA (transfuse if necessary to ensure appropriate perfusion for increased activity tolerance) 3. Due to safety, disease management, medication administration and patient education, does the patient require 24 hr/day rehab nursing? Yes 4. Does the patient require coordinated care of a physician, rehab nurse, PT (1-2 hrs/day, 5 days/week), OT (1-2 hrs/day, 5 days/week) and SLP (1-2 hrs/day, 5 days/week) to address physical and functional deficits in the context of the above medical diagnosis(es)? Yes Addressing deficits in the following areas: balance, endurance, locomotion, transferring, feeding, toileting, cognition, speech and psychosocial support 5. Can the patient actively participate in an intensive therapy program of at least 3 hrs of therapy per day at least 5 days per week? Yes 6. The potential for patient to make measurable gains while on inpatient rehab  is excellent 7. Anticipated functional outcomes upon discharge from inpatient rehab are supervision and min assist  with PT, supervision and min assist with OT, supervision with SLP. 8. Estimated rehab length of stay to reach the above functional goals is: 16-19 days. 9. Does the  patient have adequate social supports and living environment to accommodate these discharge functional goals? Potentially 10. Anticipated D/C setting: Home 11. Anticipated post D/C treatments: HH therapy and Home excercise program 12. Overall Rehab/Functional Prognosis: good  RECOMMENDATIONS: This patient's condition is appropriate for continued rehabilitative care in the following setting: CIR if caregiver support available at discharge.  Patient has agreed to participate in recommended program. Yes Note that insurance prior authorization may be required for reimbursement for recommended care.  Comment: Rehab Admissions Coordinator to follow up.  Delice Lesch, MD, Mellody Drown 05/07/2016

## 2016-05-08 ENCOUNTER — Inpatient Hospital Stay (HOSPITAL_COMMUNITY)
Admission: RE | Admit: 2016-05-08 | Discharge: 2016-05-17 | DRG: 093 | Disposition: A | Discharge status: Home Health | Payer: Medicare Other | Source: Intra-hospital | Attending: Physical Medicine & Rehabilitation | Admitting: Physical Medicine & Rehabilitation

## 2016-05-08 ENCOUNTER — Inpatient Hospital Stay (HOSPITAL_COMMUNITY): Payer: Medicare Other

## 2016-05-08 DIAGNOSIS — E1159 Type 2 diabetes mellitus with other circulatory complications: Secondary | ICD-10-CM | POA: Diagnosis present

## 2016-05-08 DIAGNOSIS — D519 Vitamin B12 deficiency anemia, unspecified: Secondary | ICD-10-CM | POA: Diagnosis present

## 2016-05-08 DIAGNOSIS — I69398 Other sequelae of cerebral infarction: Secondary | ICD-10-CM

## 2016-05-08 DIAGNOSIS — I4891 Unspecified atrial fibrillation: Secondary | ICD-10-CM | POA: Diagnosis not present

## 2016-05-08 DIAGNOSIS — I482 Chronic atrial fibrillation: Secondary | ICD-10-CM | POA: Diagnosis not present

## 2016-05-08 DIAGNOSIS — I480 Paroxysmal atrial fibrillation: Secondary | ICD-10-CM | POA: Diagnosis not present

## 2016-05-08 DIAGNOSIS — K5901 Slow transit constipation: Secondary | ICD-10-CM | POA: Diagnosis not present

## 2016-05-08 DIAGNOSIS — K5900 Constipation, unspecified: Secondary | ICD-10-CM | POA: Diagnosis not present

## 2016-05-08 DIAGNOSIS — N189 Chronic kidney disease, unspecified: Secondary | ICD-10-CM

## 2016-05-08 DIAGNOSIS — E669 Obesity, unspecified: Secondary | ICD-10-CM | POA: Diagnosis not present

## 2016-05-08 DIAGNOSIS — I639 Cerebral infarction, unspecified: Secondary | ICD-10-CM

## 2016-05-08 DIAGNOSIS — D509 Iron deficiency anemia, unspecified: Secondary | ICD-10-CM | POA: Diagnosis present

## 2016-05-08 DIAGNOSIS — Z7982 Long term (current) use of aspirin: Secondary | ICD-10-CM | POA: Diagnosis not present

## 2016-05-08 DIAGNOSIS — D510 Vitamin B12 deficiency anemia due to intrinsic factor deficiency: Secondary | ICD-10-CM | POA: Diagnosis not present

## 2016-05-08 DIAGNOSIS — Z885 Allergy status to narcotic agent status: Secondary | ICD-10-CM

## 2016-05-08 DIAGNOSIS — Z87891 Personal history of nicotine dependence: Secondary | ICD-10-CM | POA: Diagnosis not present

## 2016-05-08 DIAGNOSIS — F419 Anxiety disorder, unspecified: Secondary | ICD-10-CM | POA: Diagnosis present

## 2016-05-08 DIAGNOSIS — Z9181 History of falling: Secondary | ICD-10-CM | POA: Diagnosis not present

## 2016-05-08 DIAGNOSIS — I63531 Cerebral infarction due to unspecified occlusion or stenosis of right posterior cerebral artery: Secondary | ICD-10-CM | POA: Insufficient documentation

## 2016-05-08 DIAGNOSIS — I69319 Unspecified symptoms and signs involving cognitive functions following cerebral infarction: Secondary | ICD-10-CM | POA: Diagnosis not present

## 2016-05-08 DIAGNOSIS — Z9981 Dependence on supplemental oxygen: Secondary | ICD-10-CM

## 2016-05-08 DIAGNOSIS — I447 Left bundle-branch block, unspecified: Secondary | ICD-10-CM | POA: Diagnosis not present

## 2016-05-08 DIAGNOSIS — I69393 Ataxia following cerebral infarction: Secondary | ICD-10-CM

## 2016-05-08 DIAGNOSIS — Z7901 Long term (current) use of anticoagulants: Secondary | ICD-10-CM

## 2016-05-08 DIAGNOSIS — G4733 Obstructive sleep apnea (adult) (pediatric): Secondary | ICD-10-CM | POA: Diagnosis present

## 2016-05-08 DIAGNOSIS — I63441 Cerebral infarction due to embolism of right cerebellar artery: Secondary | ICD-10-CM | POA: Diagnosis not present

## 2016-05-08 DIAGNOSIS — D125 Benign neoplasm of sigmoid colon: Secondary | ICD-10-CM | POA: Diagnosis not present

## 2016-05-08 DIAGNOSIS — G4709 Other insomnia: Secondary | ICD-10-CM | POA: Diagnosis present

## 2016-05-08 DIAGNOSIS — Z950 Presence of cardiac pacemaker: Secondary | ICD-10-CM | POA: Diagnosis not present

## 2016-05-08 DIAGNOSIS — I129 Hypertensive chronic kidney disease with stage 1 through stage 4 chronic kidney disease, or unspecified chronic kidney disease: Secondary | ICD-10-CM | POA: Diagnosis not present

## 2016-05-08 DIAGNOSIS — I69322 Dysarthria following cerebral infarction: Secondary | ICD-10-CM

## 2016-05-08 DIAGNOSIS — E118 Type 2 diabetes mellitus with unspecified complications: Secondary | ICD-10-CM | POA: Diagnosis not present

## 2016-05-08 DIAGNOSIS — Z961 Presence of intraocular lens: Secondary | ICD-10-CM | POA: Diagnosis present

## 2016-05-08 DIAGNOSIS — R269 Unspecified abnormalities of gait and mobility: Secondary | ICD-10-CM | POA: Diagnosis not present

## 2016-05-08 DIAGNOSIS — I10 Essential (primary) hypertension: Secondary | ICD-10-CM | POA: Diagnosis not present

## 2016-05-08 DIAGNOSIS — E785 Hyperlipidemia, unspecified: Secondary | ICD-10-CM | POA: Diagnosis present

## 2016-05-08 DIAGNOSIS — K219 Gastro-esophageal reflux disease without esophagitis: Secondary | ICD-10-CM | POA: Diagnosis present

## 2016-05-08 DIAGNOSIS — Z881 Allergy status to other antibiotic agents status: Secondary | ICD-10-CM

## 2016-05-08 DIAGNOSIS — I638 Other cerebral infarction: Secondary | ICD-10-CM | POA: Diagnosis not present

## 2016-05-08 DIAGNOSIS — R4189 Other symptoms and signs involving cognitive functions and awareness: Secondary | ICD-10-CM | POA: Diagnosis present

## 2016-05-08 DIAGNOSIS — I69315 Cognitive social or emotional deficit following cerebral infarction: Secondary | ICD-10-CM | POA: Diagnosis not present

## 2016-05-08 DIAGNOSIS — E876 Hypokalemia: Secondary | ICD-10-CM | POA: Diagnosis not present

## 2016-05-08 DIAGNOSIS — R2689 Other abnormalities of gait and mobility: Secondary | ICD-10-CM | POA: Diagnosis present

## 2016-05-08 DIAGNOSIS — E860 Dehydration: Secondary | ICD-10-CM | POA: Diagnosis not present

## 2016-05-08 DIAGNOSIS — IMO0002 Reserved for concepts with insufficient information to code with codable children: Secondary | ICD-10-CM

## 2016-05-08 DIAGNOSIS — Z7984 Long term (current) use of oral hypoglycemic drugs: Secondary | ICD-10-CM

## 2016-05-08 DIAGNOSIS — Z5181 Encounter for therapeutic drug level monitoring: Secondary | ICD-10-CM | POA: Diagnosis not present

## 2016-05-08 DIAGNOSIS — D62 Acute posthemorrhagic anemia: Secondary | ICD-10-CM | POA: Diagnosis not present

## 2016-05-08 DIAGNOSIS — Z6830 Body mass index (BMI) 30.0-30.9, adult: Secondary | ICD-10-CM | POA: Diagnosis not present

## 2016-05-08 DIAGNOSIS — K921 Melena: Secondary | ICD-10-CM | POA: Diagnosis not present

## 2016-05-08 DIAGNOSIS — R0600 Dyspnea, unspecified: Secondary | ICD-10-CM | POA: Diagnosis not present

## 2016-05-08 DIAGNOSIS — Z6828 Body mass index (BMI) 28.0-28.9, adult: Secondary | ICD-10-CM | POA: Diagnosis not present

## 2016-05-08 DIAGNOSIS — E1122 Type 2 diabetes mellitus with diabetic chronic kidney disease: Secondary | ICD-10-CM | POA: Diagnosis not present

## 2016-05-08 DIAGNOSIS — K922 Gastrointestinal hemorrhage, unspecified: Secondary | ICD-10-CM | POA: Diagnosis not present

## 2016-05-08 DIAGNOSIS — D120 Benign neoplasm of cecum: Secondary | ICD-10-CM | POA: Diagnosis not present

## 2016-05-08 DIAGNOSIS — Z79899 Other long term (current) drug therapy: Secondary | ICD-10-CM

## 2016-05-08 LAB — GLUCOSE, CAPILLARY
Glucose-Capillary: 135 mg/dL — ABNORMAL HIGH (ref 65–99)
Glucose-Capillary: 132 mg/dL — ABNORMAL HIGH (ref 65–99)
Glucose-Capillary: 164 mg/dL — ABNORMAL HIGH (ref 65–99)
Glucose-Capillary: 169 mg/dL — ABNORMAL HIGH (ref 65–99)
Glucose-Capillary: 96 mg/dL (ref 65–99)

## 2016-05-08 LAB — BASIC METABOLIC PANEL
ANION GAP: 8 (ref 5–15)
BUN: 19 mg/dL (ref 6–20)
CALCIUM: 8.9 mg/dL (ref 8.9–10.3)
CO2: 28 mmol/L (ref 22–32)
CREATININE: 1.21 mg/dL (ref 0.61–1.24)
Chloride: 104 mmol/L (ref 101–111)
GFR calc Af Amer: >60 mL/min (ref >60)
GFR, EST NON AFRICAN AMERICAN: 54 mL/min — AB (ref >60)
GLUCOSE: 131 mg/dL — AB (ref 65–99)
Potassium: 3.6 mmol/L (ref 3.5–5.1)
Sodium: 140 mmol/L (ref 135–145)

## 2016-05-08 LAB — CBC WITH DIFFERENTIAL/PLATELET
BASOS ABS: 0 10*3/uL (ref 0.0–0.1)
BASOS PCT: 1 %
EOS PCT: 3 %
Eosinophils Absolute: 0.2 10*3/uL (ref 0.0–0.7)
HCT: 30.8 % — ABNORMAL LOW (ref 39.0–52.0)
Hemoglobin: 9.5 g/dL — ABNORMAL LOW (ref 13.0–17.0)
LYMPHS PCT: 18 %
Lymphs Abs: 0.8 10*3/uL (ref 0.7–4.0)
MCH: 29.9 pg (ref 26.0–34.0)
MCHC: 30.8 g/dL (ref 30.0–36.0)
MCV: 96.9 fL (ref 78.0–100.0)
MONO ABS: 0.6 10*3/uL (ref 0.1–1.0)
MONOS PCT: 13 %
NEUTROS PCT: 65 %
Neutro Abs: 2.8 10*3/uL (ref 1.7–7.7)
PLATELETS: 128 10*3/uL — AB (ref 150–400)
RBC: 3.18 MIL/uL — ABNORMAL LOW (ref 4.22–5.81)
RDW: 16 % — AB (ref 11.5–15.5)
WBC: 4.4 10*3/uL (ref 4.0–10.5)

## 2016-05-08 MED ORDER — SERTRALINE HCL 100 MG PO TABS
100.0000 mg | ORAL_TABLET | Freq: Every day | ORAL | Status: DC
  Administered 2016-05-09 – 2016-05-17 (×9): 100 mg via ORAL
  Filled 2016-05-08 (×9): qty 1

## 2016-05-08 MED ORDER — GUAIFENESIN-DM 100-10 MG/5ML PO SYRP: 5.0000 mL | ORAL_SOLUTION | Freq: Four times a day (QID) | ORAL | Status: DC | PRN

## 2016-05-08 MED ORDER — PROCHLORPERAZINE 25 MG RE SUPP: 12.5000 mg | Freq: Four times a day (QID) | RECTAL | Status: DC | PRN

## 2016-05-08 MED ORDER — BISACODYL 10 MG RE SUPP
10.0000 mg | Freq: Every day | RECTAL | Status: DC | PRN
Start: 2016-05-08 — End: 2016-05-08

## 2016-05-08 MED ORDER — ACETAMINOPHEN 325 MG PO TABS
325.0000 mg | ORAL_TABLET | ORAL | Status: DC | PRN
  Administered 2016-05-09 – 2016-05-16 (×10): 650 mg via ORAL
  Filled 2016-05-08 (×9): qty 2

## 2016-05-08 MED ORDER — METOPROLOL TARTRATE 50 MG PO TABS: 50.0000 mg | ORAL_TABLET | Freq: Two times a day (BID) | ORAL | Status: DC

## 2016-05-08 MED ORDER — TRAZODONE HCL 50 MG PO TABS: 25.0000 mg | ORAL_TABLET | Freq: Every evening | ORAL | Status: DC | PRN

## 2016-05-08 MED ORDER — PROCHLORPERAZINE EDISYLATE 5 MG/ML IJ SOLN: 5.0000 mg | Freq: Four times a day (QID) | INTRAMUSCULAR | Status: DC | PRN

## 2016-05-08 MED ORDER — RIVAROXABAN 20 MG PO TABS
20.0000 mg | ORAL_TABLET | Freq: Every day | ORAL | Status: DC
  Administered 2016-05-09 – 2016-05-16 (×8): 20 mg via ORAL
  Filled 2016-05-08 (×8): qty 1

## 2016-05-08 MED ORDER — ATORVASTATIN CALCIUM 40 MG PO TABS
40.0000 mg | ORAL_TABLET | Freq: Every day | ORAL | Status: DC
  Administered 2016-05-09 – 2016-05-16 (×8): 40 mg via ORAL
  Filled 2016-05-08 (×8): qty 1

## 2016-05-08 MED ORDER — CYANOCOBALAMIN 1000 MCG/ML IJ SOLN
1000.0000 ug | Freq: Once | INTRAMUSCULAR | Status: AC
Start: 2016-05-08 — End: 2016-05-08
  Administered 2016-05-08: 1000 ug via INTRAMUSCULAR
  Filled 2016-05-08: qty 1

## 2016-05-08 MED ORDER — AMIODARONE HCL 200 MG PO TABS
200.0000 mg | ORAL_TABLET | ORAL | Status: DC
  Administered 2016-05-09 – 2016-05-16 (×4): 200 mg via ORAL
  Filled 2016-05-08 (×4): qty 1

## 2016-05-08 MED ORDER — METOPROLOL TARTRATE 50 MG PO TABS
50.0000 mg | ORAL_TABLET | Freq: Two times a day (BID) | ORAL | Status: DC
  Administered 2016-05-08 – 2016-05-17 (×18): 50 mg via ORAL
  Filled 2016-05-08 (×18): qty 1

## 2016-05-08 MED ORDER — FLEET ENEMA 7-19 GM/118ML RE ENEM: 1.0000 | ENEMA | Freq: Once | RECTAL | Status: DC | PRN

## 2016-05-08 MED ORDER — FLUTICASONE PROPIONATE 50 MCG/ACT NA SUSP: 1.0000 | Freq: Every day | NASAL | Status: DC | PRN

## 2016-05-08 MED ORDER — BISACODYL 10 MG RE SUPP: 10.0000 mg | Freq: Every day | RECTAL | Status: DC | PRN

## 2016-05-08 MED ORDER — SENNOSIDES-DOCUSATE SODIUM 8.6-50 MG PO TABS: 1.0000 | ORAL_TABLET | Freq: Every evening | ORAL | Status: DC | PRN

## 2016-05-08 MED ORDER — PIOGLITAZONE HCL 30 MG PO TABS
30.0000 mg | ORAL_TABLET | Freq: Every day | ORAL | Status: DC
  Administered 2016-05-08 – 2016-05-17 (×10): 30 mg via ORAL
  Filled 2016-05-08 (×11): qty 1

## 2016-05-08 MED ORDER — ONDANSETRON HCL 4 MG/2ML IJ SOLN: 4.0000 mg | Freq: Four times a day (QID) | INTRAMUSCULAR | Status: DC | PRN

## 2016-05-08 MED ORDER — ALUM & MAG HYDROXIDE-SIMETH 200-200-20 MG/5ML PO SUSP: 30.0000 mL | ORAL | Status: DC | PRN

## 2016-05-08 MED ORDER — ASPIRIN EC 81 MG PO TBEC
81.0000 mg | DELAYED_RELEASE_TABLET | Freq: Every day | ORAL | Status: DC
  Administered 2016-05-09 – 2016-05-17 (×9): 81 mg via ORAL
  Filled 2016-05-08 (×9): qty 1

## 2016-05-08 MED ORDER — PROCHLORPERAZINE MALEATE 5 MG PO TABS: 5.0000 mg | ORAL_TABLET | Freq: Four times a day (QID) | ORAL | Status: DC | PRN

## 2016-05-08 MED ORDER — ONDANSETRON HCL 4 MG PO TABS: 4.0000 mg | ORAL_TABLET | Freq: Four times a day (QID) | ORAL | Status: DC | PRN

## 2016-05-08 MED ORDER — INSULIN ASPART 100 UNIT/ML ~~LOC~~ SOLN
0.0000 [IU] | Freq: Three times a day (TID) | SUBCUTANEOUS | Status: DC
  Administered 2016-05-09 (×2): 1 [IU] via SUBCUTANEOUS
  Administered 2016-05-09: 2 [IU] via SUBCUTANEOUS
  Administered 2016-05-10 (×2): 1 [IU] via SUBCUTANEOUS
  Administered 2016-05-10: 2 [IU] via SUBCUTANEOUS
  Administered 2016-05-11 – 2016-05-12 (×5): 1 [IU] via SUBCUTANEOUS
  Administered 2016-05-13 – 2016-05-14 (×4): 2 [IU] via SUBCUTANEOUS
  Administered 2016-05-14 – 2016-05-17 (×5): 1 [IU] via SUBCUTANEOUS

## 2016-05-08 MED ORDER — PANTOPRAZOLE SODIUM 40 MG PO TBEC
40.0000 mg | DELAYED_RELEASE_TABLET | Freq: Every day | ORAL | Status: DC
  Administered 2016-05-09 – 2016-05-17 (×9): 40 mg via ORAL
  Filled 2016-05-08 (×9): qty 1

## 2016-05-08 MED ORDER — DIPHENHYDRAMINE HCL 12.5 MG/5ML PO ELIX: 12.5000 mg | ORAL_SOLUTION | Freq: Four times a day (QID) | ORAL | Status: DC | PRN

## 2016-05-08 MED ORDER — PANTOPRAZOLE SODIUM 40 MG PO TBEC: 40.0000 mg | DELAYED_RELEASE_TABLET | Freq: Every day | ORAL | Status: DC

## 2016-05-08 NOTE — Progress Notes (Signed)
Ankit Lorie Phenix, MD Physician Signed Physical Medicine and Rehabilitation  Consult Note Date of Service: 05/07/2016 3:25 PM  Related encounter: ED to Hosp-Admission (Current) from 05/05/2016 in [place] All Collapse All   [] Hide copied text [] Hover for attribution information      Physical Medicine and Rehabilitation Consult   Reason for Consult: Dizziness and balance deficits Referring Physician: Dr. Maylene Roes   HPI: Richard [name] is a 82 y.o. male with history of A fib-on xarelto, B 12 deficiency, T2DM, HTN, OSA,  CHB s/p PPM, cognitive impairements who was admitted on 05/05/16 with complaints of weakness, vertigo and double vision.  CT head negative for acute changes. CTA head/neck done revealing no occlusions, dissection or significant stenosis. Patient with nystagmus and RUE/RLE dysmetria felt to be due to small cerebellar stroke. Neurology recommended continuing Xarelto. Speech therapy evaluation revealed baseline dysarthria and severe cognitive deficits.  Therapy ongoing and patient with balance deficits , dizziness and nystagmus with head turn. CIR recommended for follow up therapy.    Review of Systems  Constitutional: Negative for malaise/fatigue.  HENT: Positive for hearing loss.   Eyes: Positive for double vision (on and off?).  Respiratory: Negative for cough and shortness of breath.   Cardiovascular: Negative for chest pain and palpitations.  Gastrointestinal: Negative for abdominal pain, heartburn and nausea.  Genitourinary: Negative for dysuria and urgency.  Musculoskeletal: Positive for back pain and falls. Negative for myalgias.       Has had problems walking since last December.  Neurological: Positive for speech change. Negative for dizziness (resolved).  Psychiatric/Behavioral: Positive for memory loss. The patient is not nervous/anxious.   All other systems reviewed and are negative.       Past Medical  History:  Diagnosis Date  . Anxiety   . Arthritis   . Atrial fibrillation ([place])    a. Dx 03/2013, notes report atrial fibrillation/atrial flutter, placed on amiodarone. NSR in subsequent OV's.  . B12 deficiency anemia   . Chest pain    a. H/o CTA negative for PE 2012, normal cath 2005, normal nucs previously including 05/2012.  Marland Kitchen Complete heart block (HCC)    a. s/p Medtronic Adapta L model ADDRL 1 (serial number [id number]) pacemaker.  Marland Kitchen GERD (gastroesophageal reflux disease)   . Hiatal hernia   . Hyperlipidemia   . Hypertension   . Iron deficiency anemia   . LBBB (left bundle branch block)   . Microcytic anemia   . Nephrolithiasis   . On home oxygen therapy    a. 2L w/CPAP at night  . Orthostatic hypotension   . OSA on CPAP    cpap  . [place] spotted fever ~ 1945  . Sinus drainage   . Skin cancer of lip   . Type II diabetes mellitus ([place])          Past Surgical History:  Procedure Laterality Date  . CARDIAC CATHETERIZATION     by Dr. Acie Fredrickson, January 24, 2004, that shows minimal coronary artery irregularities and normal left ventricular function  . CATARACT EXTRACTION W/ INTRAOCULAR LENS  IMPLANT, BILATERAL Bilateral   . ESOPHAGOGASTRODUODENOSCOPY (EGD) WITH PROPOFOL N/A 06/21/2015   Procedure: ESOPHAGOGASTRODUODENOSCOPY (EGD) WITH PROPOFOL;  Surgeon: Gatha Mayer, MD;  Location: [facility] ENDOSCOPY;  Service: Endoscopy;  Laterality: N/A;  . INGUINAL HERNIA REPAIR Right   . LUMBAR DISC SURGERY  ~ 1993  . PERMANENT PACEMAKER INSERTION N/A 06/03/2014   MDT  Adapta L implanted by Dr Rayann Heman for syncope and transient AV block  . SKIN CANCER EXCISION     "lower lip" (04/06/2013)  . TEMPORARY PACEMAKER INSERTION N/A 05/31/2014   Procedure: TEMPORARY WIRE;  Surgeon: Sinclair Grooms, MD;  Location: [facility] CATH LAB;  Service: Cardiovascular;  Laterality: N/A;  . VASECTOMY     Hx of           Family History  Problem Relation Age of Onset    . Thyroid disease Mother     goiter  . Pneumonia Father   . Other      Parents both died of old age; other conditions not known  . Stroke Brother   . Breast cancer Sister     Social History:  Married. Independent PTA.  Per reports that he has quit smoking. His smoking use included Cigarettes. He smoked 1.00 packs per day for 20.00 years. He has never used smokeless tobacco. He reports that he does not drink alcohol or use drugs.         Allergies  Allergen Reactions  . Codeine Other (See Comments)    Hallucinations  . Citalopram Swelling and Rash          Medications Prior to Admission  Medication Sig Dispense Refill  . acetaminophen (TYLENOL) 500 MG tablet Take 500 mg by mouth 2 (two) times daily.     Marland Kitchen amiodarone (PACERONE) 200 MG tablet Take 200 mg by mouth 3 (three) times a week. On Monday, Wednesday & Friday    . amLODipine (NORVASC) 2.5 MG tablet Take 1 tablet (2.5 mg total) by mouth daily. 30 tablet 11  . benazepril (LOTENSIN) 40 MG tablet Take 40 mg by mouth daily.      . cyanocobalamin (,VITAMIN B-12,) 1000 MCG/ML injection Inject 1,000 mcg into the muscle every 30 (thirty) days.    . diazepam (VALIUM) 5 MG tablet Take 5 mg by mouth at bedtime.      . fluticasone (FLONASE) 50 MCG/ACT nasal spray Place 1 spray into the nose daily as needed for allergies or rhinitis.     . metFORMIN (GLUCOPHAGE) 1000 MG tablet Take 1,000 mg by mouth 2 (two) times daily with a meal.    . metoprolol (LOPRESSOR) 50 MG tablet TAKE 1 TABLET BY MOUTH TWICE A DAY 60 tablet 11  . omeprazole (PRILOSEC) 20 MG capsule Take 20 mg by mouth daily.     . OXYGEN Inhale into the lungs at bedtime. CPAP with OXYGEN    . pioglitazone (ACTOS) 30 MG tablet Take 30 mg by mouth daily.      . sertraline (ZOLOFT) 100 MG tablet Take 1 tablet by mouth daily.    Alveda Reasons 20 MG TABS tablet TAKE 1 TABLET (20 MG TOTAL) BY MOUTH DAILY WITH SUPPER. 30 tablet 11    Home: Home  Living Family/patient expects to be discharged to:: Private residence Living Arrangements: Spouse/significant other Available Help at Discharge: Family Type of Home: House Home Access: Stairs to enter Technical brewer of Steps: 2-3 Home Layout: Two level, Able to live on main level with bedroom/bathroom (basement) Bathroom Shower/Tub: Multimedia programmer: Handicapped height Home Equipment: Civil engineer, contracting, Grab bars - tub/shower, Radio producer - quad  Lives With: Spouse  Functional History: Prior Function Level of Independence: Independent Comments: Walks short distances (~30-40 feet). Uses cane for distance if needed. Drives. Hx of falls, increased falls recently. Functional Status:  Mobility: Bed Mobility Overal bed mobility: Needs Assistance Bed Mobility: Supine  to Sit Rolling: Min guard Sidelying to sit: Min assist Sit to supine: Min assist General bed mobility comments: pt having some trouble getting fully upright without assist Transfers Overall transfer level: Needs assistance Equipment used: 2 person hand held assist Transfers: Sit to/from Stand, Stand Pivot Transfers Sit to Stand: Min assist, Mod assist General transfer comment: assist coming forward mod assist the lower down to the floor Ambulation/Gait Ambulation/Gait assistance: Mod assist (needed +2 for safety and not available) Ambulation Distance (Feet): 50 Feet (additional 10x2 to/from [facility]) Assistive device: Rolling walker (2 wheeled) Gait Pattern/deviations: Step-through pattern, Ataxic, Scissoring, Shuffle General Gait Details: mildly ataxic, left shuffled gait with incoordination and instability with RW.  Control needs to be exerted onto patient to help him stay under control with LE and trunk Gait velocity interpretation: Below normal speed for age/gender    ADL: ADL Overall ADL's : Needs assistance/impaired Eating/Feeding: Set up, Bed level Grooming: Minimal assistance, Sitting Upper Body  Bathing: Minimal assitance, Sitting Lower Body Bathing: Moderate assistance, +2 for physical assistance, Sit to/from stand Upper Body Dressing : Minimal assistance, Sitting Lower Body Dressing: Moderate assistance, Sit to/from stand, +2 for physical assistance Functional mobility during ADLs: Maximal assistance, +2 for physical assistance (for sit to stand only) General ADL Comments: +2 assist for sit to stand due to dizziness and unsteadiness. Assisted PT with vestibular eval.  Cognition: Cognition Overall Cognitive Status: History of cognitive impairments - at baseline Arousal/Alertness: Awake/alert Orientation Level: Oriented X4 Attention: Sustained Sustained Attention: Appears intact Memory: Impaired Memory Impairment: Retrieval deficit (recalled 1/5 words independently, 1/5 with category cue, 3/5 with multiple choice cue) Awareness: Appears intact (aware of premorbid memory and new vision deficits) Problem Solving: Appears intact (looking for call bell in his bed when could not locate) Safety/Judgment: Appears intact (need to call for assistance) Comments: pt acknowledges wife checks his medications to assure he is taking properly -  Cognition Arousal/Alertness: Awake/alert Behavior During Therapy: Flat affect Overall Cognitive Status: History of cognitive impairments - at baseline  Blood pressure (!) 165/72, pulse 65, temperature 97.8 F (36.6 C), temperature source Oral, resp. rate 18, height 6\' 3"  (1.905 m), weight 113.2 kg (249 lb 9 oz), SpO2 97 %. Physical Exam  Nursing note and vitals reviewed. Constitutional: He appears well-developed and well-nourished.  HENT:  Head: Normocephalic and atraumatic.  Eyes: Conjunctivae and EOM are normal. Pupils are equal, round, and reactive to light.  Neck: Normal range of motion. Neck supple.  Cardiovascular: Normal rate and regular rhythm.   Respiratory: Effort normal and breath sounds normal. No stridor.  GI: Soft. Bowel sounds  are normal. He exhibits no distension. There is no tenderness.  Musculoskeletal: He exhibits no edema or tenderness.  Neurological: He is alert.  Slightly confused Left facial weakness with moderate dysarthria.   Very HOH. Able to follow basic one and two step commands without difficulty.   Motor: 4+/5 throughout Ataxia L>R, no dymetria. Decrease in fine motor control BUE.  Sensory intact to light touch.  Proprioception intact.   Skin: Skin is warm and dry.  Psychiatric: He has a normal mood and affect. His behavior is normal.    Lab Results Last 24 Hours       Results for orders placed or performed during the hospital encounter of 05/05/16 (from the past 24 hour(s))  Glucose, capillary     Status: Abnormal   Collection Time: 05/06/16  5:18 PM  Result Value Ref Range   Glucose-Capillary 133 (H) 65 - 99 mg/dL  Glucose, capillary     Status: Abnormal   Collection Time: 05/06/16  9:31 PM  Result Value Ref Range   Glucose-Capillary 141 (H) 65 - 99 mg/dL   Comment 1 Notify RN    Comment 2 Document in Chart   Glucose, capillary     Status: Abnormal   Collection Time: 05/07/16  6:36 AM  Result Value Ref Range   Glucose-Capillary 108 (H) 65 - 99 mg/dL   Comment 1 Notify RN    Comment 2 Document in Chart   Glucose, capillary     Status: Abnormal   Collection Time: 05/07/16 11:42 AM  Result Value Ref Range   Glucose-Capillary 105 (H) 65 - 99 mg/dL      Imaging Results (Last 48 hours)  Ct Angio Head W/cm &/or Wo Cm  Result Date: 05/05/2016 CLINICAL DATA:  82 y/o  M; acute dizziness and generalized weakness. EXAM: CT ANGIOGRAPHY HEAD AND NECK TECHNIQUE: Multidetector CT imaging of the head and neck was performed using the standard protocol during bolus administration of intravenous contrast. Multiplanar CT image reconstructions and MIPs were obtained to evaluate the vascular anatomy. Carotid stenosis measurements (when applicable) are obtained utilizing NASCET  criteria, using the distal internal carotid diameter as the denominator. CONTRAST:  75 cc Isovue 370 COMPARISON:  05/05/2016 CT head.  05/18/2013 MRI brain. FINDINGS: CTA NECK Aortic arch: Mild calcific atherosclerosis of the arch. Four vessel arch. Right carotid system: No occlusion, aneurysm, dissection, or significant stenosis is identified. Minimal mixed plaque at the bifurcation. Retropharyngeal course of common carotid artery. Left carotid system: No occlusion, aneurysm, dissection, or significant stenosis is identified. Minimal mixed plaque at the bifurcation. Retropharyngeal course of common carotid artery. Vertebral arteries:No occlusion, aneurysm, dissection, or significant stenosis is identified. Skeleton: Multilevel degenerative changes of the cervical spine most pronounced at the C5 through C7 levels. No high-grade bony canal stenosis or foraminal narrowing. Other neck: Small thyroid nodules the largest in the right lower pole measuring 10 patent aerodigestive tract. Inflammatory calcifications of palatini tonsils. Fatty atrophy of the right parotid gland and calcifications probably represent sequelae of prior parotiditis. No lymphadenopathy or discrete cervical mass is identified. Left maxillary sinus aerosolized secretions, chronic inflammatory changes, and atelectasis. Otherwise visualized paranasal sinuses and mastoids are clear. Bilateral intra-ocular lens replacement. Partially visualized pacemaker. Upper chest: Peripheral reticular changes in the lung probably represents mild fibrosis. CTA HEAD Anterior circulation: Moderate calcific atherosclerosis of cavernous and paraclinoid internal carotid artery is bilateral middle cerebral arteries, anterior cerebral arteries, and internal carotid arteries are patent. Symmetric distal circulation. No occlusion, aneurysm, dissection, or significant stenosis is identified. Posterior circulation: Minimal calcification of the bilateral V4 segments. Bilateral  vertebral arteries, the basilar artery, and posterior cerebral arteries are patent. Symmetric distal circulation. No occlusion, aneurysm, dissection, or significant stenosis is identified. Venous sinuses: No thrombus identified. Anatomic variants: Patent anterior communicating artery. Fetal left PCA. No right posterior communicating artery identified, hypoplastic or absent. Delayed phase: Small hyperdense focus within the right paramedian pons, dense on noncontrast CT, possibly mineralization related to old lacunar infarct. No definite abnormal enhancement. IMPRESSION: No occlusion, aneurysm, dissection, or significant stenosis of the circle of Willis or carotid and vertebral arteries of the neck is identified. Mild mixed plaque of the carotid bifurcations and intracranial calcific atherosclerosis. Electronically Signed   By: Kristine Garbe M.D.   On: 05/05/2016 20:36   Ct Angio Neck W Or Wo Contrast  Result Date: 05/05/2016 CLINICAL DATA:  82 y/o  M; acute dizziness  and generalized weakness. EXAM: CT ANGIOGRAPHY HEAD AND NECK TECHNIQUE: Multidetector CT imaging of the head and neck was performed using the standard protocol during bolus administration of intravenous contrast. Multiplanar CT image reconstructions and MIPs were obtained to evaluate the vascular anatomy. Carotid stenosis measurements (when applicable) are obtained utilizing NASCET criteria, using the distal internal carotid diameter as the denominator. CONTRAST:  75 cc Isovue 370 COMPARISON:  05/05/2016 CT head.  05/18/2013 MRI brain. FINDINGS: CTA NECK Aortic arch: Mild calcific atherosclerosis of the arch. Four vessel arch. Right carotid system: No occlusion, aneurysm, dissection, or significant stenosis is identified. Minimal mixed plaque at the bifurcation. Retropharyngeal course of common carotid artery. Left carotid system: No occlusion, aneurysm, dissection, or significant stenosis is identified. Minimal mixed plaque at the  bifurcation. Retropharyngeal course of common carotid artery. Vertebral arteries:No occlusion, aneurysm, dissection, or significant stenosis is identified. Skeleton: Multilevel degenerative changes of the cervical spine most pronounced at the C5 through C7 levels. No high-grade bony canal stenosis or foraminal narrowing. Other neck: Small thyroid nodules the largest in the right lower pole measuring 10 patent aerodigestive tract. Inflammatory calcifications of palatini tonsils. Fatty atrophy of the right parotid gland and calcifications probably represent sequelae of prior parotiditis. No lymphadenopathy or discrete cervical mass is identified. Left maxillary sinus aerosolized secretions, chronic inflammatory changes, and atelectasis. Otherwise visualized paranasal sinuses and mastoids are clear. Bilateral intra-ocular lens replacement. Partially visualized pacemaker. Upper chest: Peripheral reticular changes in the lung probably represents mild fibrosis. CTA HEAD Anterior circulation: Moderate calcific atherosclerosis of cavernous and paraclinoid internal carotid artery is bilateral middle cerebral arteries, anterior cerebral arteries, and internal carotid arteries are patent. Symmetric distal circulation. No occlusion, aneurysm, dissection, or significant stenosis is identified. Posterior circulation: Minimal calcification of the bilateral V4 segments. Bilateral vertebral arteries, the basilar artery, and posterior cerebral arteries are patent. Symmetric distal circulation. No occlusion, aneurysm, dissection, or significant stenosis is identified. Venous sinuses: No thrombus identified. Anatomic variants: Patent anterior communicating artery. Fetal left PCA. No right posterior communicating artery identified, hypoplastic or absent. Delayed phase: Small hyperdense focus within the right paramedian pons, dense on noncontrast CT, possibly mineralization related to old lacunar infarct. No definite abnormal  enhancement. IMPRESSION: No occlusion, aneurysm, dissection, or significant stenosis of the circle of Willis or carotid and vertebral arteries of the neck is identified. Mild mixed plaque of the carotid bifurcations and intracranial calcific atherosclerosis. Electronically Signed   By: Kristine Garbe M.D.   On: 05/05/2016 20:36     Assessment/Plan: Diagnosis: small cerebellar stroke Labs and images independently reviewed.  Records reviewed and summated above. Stroke: Continue secondary stroke prophylaxis and Risk Factor Modification listed below:   Antiplatelet therapy:   Blood Pressure Management:  Continue current medication with prn's with permisive HTN per primary team Statin Agent:   Diabetes management:    1. Does the need for close, 24 hr/day medical supervision in concert with the patient's rehab needs make it unreasonable for this patient to be served in a less intensive setting? Yes 2. Co-Morbidities requiring supervision/potential complications: A fib-(cont meds, monitor HR with increased activity), B 12 deficiency (supplement), T2 DM (Monitor in accordance with exercise and adjust meds as necessary), HTN (monitor and provide prns in accordance with increased physical exertion and pain), OSA (monitor for daytime somnolence, CPAP)  CHB s/p PPM (monitor HR), cognitive impairements (SLP), ABLA (transfuse if necessary to ensure appropriate perfusion for increased activity tolerance) 3. Due to safety, disease management, medication administration and patient education, does  the patient require 24 hr/day rehab nursing? Yes 4. Does the patient require coordinated care of a physician, rehab nurse, PT (1-2 hrs/day, 5 days/week), OT (1-2 hrs/day, 5 days/week) and SLP (1-2 hrs/day, 5 days/week) to address physical and functional deficits in the context of the above medical diagnosis(es)? Yes Addressing deficits in the following areas: balance, endurance, locomotion, transferring,  feeding, toileting, cognition, speech and psychosocial support 5. Can the patient actively participate in an intensive therapy program of at least 3 hrs of therapy per day at least 5 days per week? Yes 6. The potential for patient to make measurable gains while on inpatient rehab is excellent 7. Anticipated functional outcomes upon discharge from inpatient rehab are supervision and min assist  with PT, supervision and min assist with OT, supervision with SLP. 8. Estimated rehab length of stay to reach the above functional goals is: 16-19 days. 9. Does the patient have adequate social supports and living environment to accommodate these discharge functional goals? Potentially 10. Anticipated D/C setting: Home 11. Anticipated post D/C treatments: HH therapy and Home excercise program 12. Overall Rehab/Functional Prognosis: good  RECOMMENDATIONS: This patient's condition is appropriate for continued rehabilitative care in the following setting: CIR if caregiver support available at discharge.  Patient has agreed to participate in recommended program. Yes Note that insurance prior authorization may be required for reimbursement for recommended care.  Comment: Rehab Admissions Coordinator to follow up.  Delice Lesch, MD, [facility] 05/07/2016    Revision History                   Routing History

## 2016-05-08 NOTE — PMR Pre-admission (Signed)
PMR Admission Coordinator Pre-Admission Assessment  Patient: Richard [name] is an 82 y.o., male MRN: [id number] DOB: Oct 28, 1930 Height: 6\' 3"  (190.5 cm) Weight: 113.2 kg (249 lb 9 oz)              Insurance Information HMO:     PPO:      PCP:      IPA:      80/20:      OTHER:  PRIMARY: Medicare A & B      Policy#: [id number]      Subscriber: Self CM Name:       Phone#:      Fax#:  Pre-Cert#:       Employer: Retired Benefits:  Phone #:      Name:  Eff. Date: 08/25/97     Deduct: $1,316      Out of Pocket Max: None      Life Max: N/A CIR: 100%      SNF: 100% Outpatient: 80%     Co-Pay: 20% Home Health: 100%      Co-Pay: none DME: 80%     Co-Pay: 20% Providers: patient's choice  SECONDARY: [employer]      Policy#: [id number]      Subscriber: Self CM Name:       Phone#:     Fax#:  Pre-Cert#:       Employer: Retired  Benefits:  Phone #: [phone]     Name:  Eff. Date:      Deduct:       Out of Pocket Max:       Life Max:  CIR:       SNF:  Outpatient:      Co-Pay:  Home Health:       Co-Pay:  DME:      Co-Pay:   Medicaid Application Date:       Case Manager:  Disability Application Date:       Case Worker:   Emergency Contact Information Contact Information    Name Relation Home Work Mobile   [place] Spouse [phone]  [phone]     Current Medical History  Patient Admitting Diagnosis: Small cerebellar stroke  History of Present Illness: Korbyn Symmes Robertsis a 82 y.o.malewith history of A fib-on Xarelto, B 12 deficiency, T2DM, HTN, OSA, CHB s/p PPM, cognitive impairements who was admitted on 05/05/16 with complaints of weakness, vertigo and double vision. CT head negative for acute changes. CTA head/neck done revealing no occlusions, dissection or significant stenosis. Patient with nystagmus and RUE/RLE dysmetria felt to be due to small cerebellar stroke. Neurology recommended adding low dose ASA to posterior circulation infarct likely due to SVD and continuing  Xarelto. Speech therapy evaluation revealed baseline dysarthria and severe cognitive deficits. Therapy ongoing and patient with balance deficits, dizziness and nystagmus with head turn. CIR recommended for follow up therapy.   NIH Total: 1    Past Medical History  Past Medical History:  Diagnosis Date  . Anxiety   . Arthritis   . Atrial fibrillation ([place])    a. Dx 03/2013, notes report atrial fibrillation/atrial flutter, placed on amiodarone. NSR in subsequent OV's.  . B12 deficiency anemia   . Chest pain    a. H/o CTA negative for PE 2012, normal cath 2005, normal nucs previously including 05/2012.  Marland Kitchen Complete heart block (HCC)    a. s/p Medtronic Adapta L model ADDRL 1 (serial number [id number]) pacemaker.  Marland Kitchen GERD (gastroesophageal reflux disease)   . Hiatal  hernia   . Hyperlipidemia   . Hypertension   . Iron deficiency anemia   . LBBB (left bundle branch block)   . Microcytic anemia   . Nephrolithiasis   . On home oxygen therapy    a. 2L w/CPAP at night  . Orthostatic hypotension   . OSA on CPAP    cpap  . [place] spotted fever ~ 1945  . Sinus drainage   . Skin cancer of lip   . Type II diabetes mellitus (HCC)     Family History  family history includes Breast cancer in his sister; Pneumonia in his father; Stroke in his brother; Thyroid disease in his mother.  Prior Rehab/Hospitalizations:  Has the patient had major surgery during 100 days prior to admission? No  Current Medications   Current Facility-Administered Medications:  .   stroke: mapping our early stages of recovery book, , Does not apply, Once, Rondel Jumbo, PA-C .  acetaminophen (TYLENOL) tablet 650 mg, 650 mg, Oral, Q4H PRN, 650 mg at 05/08/16 0431 **OR** acetaminophen (TYLENOL) suppository 650 mg, 650 mg, Rectal, Q4H PRN, Rondel Jumbo, PA-C .  amiodarone (PACERONE) tablet 200 mg, 200 mg, Oral, Once per day on Mon Wed Fri, Sara E Wertman, PA-C, 200 mg at 05/07/16 [id number] .  aspirin EC tablet 81  mg, 81 mg, Oral, Daily, Rosalin Hawking, MD, 81 mg at 05/08/16 0818 .  atorvastatin (LIPITOR) tablet 40 mg, 40 mg, Oral, q1800, Patrecia Pour, MD, 40 mg at 05/07/16 1717 .  bisacodyl (DULCOLAX) suppository 10 mg, 10 mg, Rectal, Daily PRN, Rondel Jumbo, PA-C .  cyanocobalamin ((VITAMIN B-12)) injection 1,000 mcg, 1,000 mcg, Intramuscular, Once, [employer], DO .  feeding supplement (ENSURE ENLIVE) (ENSURE ENLIVE) liquid 237 mL, 237 mL, Oral, TID BM, Patrecia Pour, MD, 237 mL at 05/08/16 0819 .  insulin aspart (novoLOG) injection 0-9 Units, 0-9 Units, Subcutaneous, TID WC, Rondel Jumbo, PA-C, 1 Units at 05/08/16 1201 .  magnesium citrate solution 1 Bottle, 1 Bottle, Oral, Once PRN, Rondel Jumbo, PA-C .  metoprolol (LOPRESSOR) tablet 50 mg, 50 mg, Oral, BID, Rondel Jumbo, PA-C, 50 mg at 05/08/16 0819 .  ondansetron (ZOFRAN) tablet 4 mg, 4 mg, Oral, Q6H PRN **OR** ondansetron (ZOFRAN) injection 4 mg, 4 mg, Intravenous, Q6H PRN, Rondel Jumbo, PA-C, 4 mg at 05/06/16 0749 .  pantoprazole (PROTONIX) EC tablet 40 mg, 40 mg, Oral, Daily, Rondel Jumbo, PA-C, 40 mg at 05/08/16 0818 .  rivaroxaban (XARELTO) tablet 20 mg, 20 mg, Oral, Q supper, Rondel Jumbo, PA-C, 20 mg at 05/07/16 1717 .  senna-docusate (Senokot-S) tablet 1 tablet, 1 tablet, Oral, QHS PRN, Rondel Jumbo, PA-C .  sertraline (ZOLOFT) tablet 100 mg, 100 mg, Oral, Daily, Rondel Jumbo, PA-C, 100 mg at 05/08/16 0819 .  traZODone (DESYREL) tablet 25 mg, 25 mg, Oral, QHS PRN, Rondel Jumbo, PA-C  Patients Current Diet: Diet heart healthy/carb modified Room service appropriate? Yes; Fluid consistency: Thin  Precautions / Restrictions Precautions Precautions: Fall Restrictions Weight Bearing Restrictions: No   Has the patient had 2 or more falls or a fall with injury in the past year?Yes, multiple falls.  Wife states 4-6 with minor injury such as bruising and skin tears.  One fall 2 months ago resulted in an ED visit with a  negative head CT.    Prior Activity Level Community (5-7x/wk): Prior to admission patient was independent with all ALDs except spouse assisted with  medication management.  Patient worked in the yard, drove around town, and frequented the farmers market.     Home Assistive Devices / Equipment Home Assistive Devices/Equipment: Cane (specify quad or straight), CBG Meter, CPAP Home Equipment: Shower seat, Grab bars - tub/shower, Cane - quad  Prior Device Use: Indicate devices/aids used by the patient prior to current illness, exacerbation or injury? cane as needed for longer distances .  Prior Functional Level Prior Function Level of Independence: Independent Comments: Walks short distances (~30-40 feet). Uses cane for distance if needed. Drives. Hx of falls, increased falls recently.  Self Care: Did the patient need help bathing, dressing, using the toilet or eating?  Independent  Indoor Mobility: Did the patient need assistance with walking from room to room (with or without device)? Independent  Stairs: Did the patient need assistance with internal or external stairs (with or without device)? Independent  Functional Cognition: Did the patient need help planning regular tasks such as shopping or remembering to take medications? Needed some help  Current Functional Level Cognition  Arousal/Alertness: Awake/alert Overall Cognitive Status: History of cognitive impairments - at baseline Orientation Level: Oriented X4 Attention: Sustained Sustained Attention: Appears intact Memory: Impaired Memory Impairment: Retrieval deficit (recalled 1/5 words independently, 1/5 with category cue, 3/5 with multiple choice cue) Awareness: Appears intact (aware of premorbid memory and new vision deficits) Problem Solving: Appears intact (looking for call bell in his bed when could not locate) Safety/Judgment: Appears intact (need to call for assistance) Comments: pt acknowledges wife checks his  medications to assure he is taking properly -     Extremity Assessment (includes Sensation/Coordination)  Upper Extremity Assessment: Defer to OT evaluation  Lower Extremity Assessment: Overall WFL for tasks assessed    ADLs  Overall ADL's : Needs assistance/impaired Eating/Feeding: Set up, Bed level Grooming: Standing, Wash/dry face (Shaving) Grooming Details (indicate cue type and reason): Min guard to mod assist provided at times for LOB. Pt with LOB x2 when vision occluded; fall to L side and required physical assist to correct. Upper Body Bathing: Minimal assitance, Sitting Lower Body Bathing: Moderate assistance, +2 for physical assistance, Sit to/from stand Upper Body Dressing : Minimal assistance, Sitting Upper Body Dressing Details (indicate cue type and reason): to don hospital gown Lower Body Dressing: Min guard (sitting only to adjust socks) Toilet Transfer: Moderate assistance, +2 for safety/equipment, Ambulation, BSC, RW Toileting- Clothing Manipulation and Hygiene: Minimal assistance, Sit to/from stand Toileting - Clothing Manipulation Details (indicate cue type and reason): Pt able to perfrom pericare after BM with physical support for balance and use of RW to steady. Functional mobility during ADLs: Moderate assistance, Rolling walker General ADL Comments: No c/o of dizziness or visual disturbance today but very unsteady in standing. Pt with LOB x2 during grooming task with vision occluded; mod assist to correct. Discussed CIR level therapies with pt and family; feel pt needs more therapy prior to return home as he is a very high fall risk at this time.    Mobility  Overal bed mobility: Needs Assistance Bed Mobility: Supine to Sit Rolling: Min guard Sidelying to sit: Min assist Sit to supine: Min assist General bed mobility comments: Grabbing for rail, stability assist    Transfers  Overall transfer level: Needs assistance Equipment used: 2 person hand held  assist Transfers: Sit to/from Stand Sit to Stand: Mod assist General transfer comment: assist coming forward and stability    Ambulation / Gait / Stairs / Wheelchair Mobility  Ambulation/Gait Ambulation/Gait assistance: Mod  assist Ambulation Distance (Feet): 80 Feet Assistive device: Rolling walker (2 wheeled) Gait Pattern/deviations: Step-through pattern General Gait Details: ataxia and incoordination of trunk and LE's necessitate assisting pt even with the RW Gait velocity interpretation: at or above normal speed for age/gender    Posture / Balance Dynamic Sitting Balance Sitting balance - Comments: Poor trunk control Balance Overall balance assessment: Needs assistance Sitting-balance support: Feet supported, No upper extremity supported Sitting balance-Leahy Scale: Fair Sitting balance - Comments: Poor trunk control Standing balance support: No upper extremity supported, During functional activity Standing balance-Leahy Scale: Poor Standing balance comment: support needed especially if eyes closed. Washing his face after shaving task pt quickly lost his balance to the left, not able to control his slam into the wall.    Special needs/care consideration BiPAP/CPAP: Yes CPAP at home PTA CPM: No Continuous Drip IV: No Dialysis: No         Life Vest: No Oxygen: No Special Bed: No Trach Size: No Wound Vac (area): No       Skin: WDL                           Bowel mgmt: Continent with therapy 9/14 Bladder mgmt: Incontinent with condom cath Diabetic mgmt: HgbA1c 5.7, managed with oral medications and diet PTA with occasional home blood sugar checks      Previous Home Environment Living Arrangements: Spouse/significant other  Lives With: Spouse Available Help at Discharge: Family Type of Home: House Home Layout: Two level, Able to live on main level with bedroom/bathroom (basement) Home Access: Stairs to enter [employer] of Steps: 2-3 Bathroom Shower/Tub:  Multimedia programmer: Handicapped height [place]: No  Discharge Living Setting Plans for Discharge Living Setting: Patient's home Type of Home at Discharge: House Discharge Home Layout: One level with basement, which patient did not go down to PTA Discharge Home Access: Stairs to enter Entrance Stairs-Rails:  (rails on each side, but can only reach one at a time) Entrance Stairs-Number of Steps: 2-3 steps  Discharge Bathroom Shower/Tub: Walk-in shower Discharge Bathroom Toilet: Handicapped height Discharge Bathroom Accessibility: Yes How Accessible: Accessible via walker Does the patient have any problems obtaining your medications?: No  Social/Family/Support Systems Patient Roles: Spouse, Parent, Other (Comment) (grandparent ) Anticipated Caregiver: Spouse: Hilton Sofield [phone] Ability/Limitations of Caregiver: Spouse available 24/7; however, she can only provide Supervision and very light Min level assist  Caregiver Availability: 24/7 Discharge Plan Discussed with Primary Caregiver: Yes Is Caregiver In Agreement with Plan?: Yes Does Caregiver/Family have Issues with Lodging/Transportation while Pt is in Rehab?: No  Goals/Additional Needs Patient/Family Goal for Rehab: PT/OT Supervision-Min A; SLP Supervision  Expected length of stay: 16-19 days Cultural Considerations: [employer]  Dietary Needs: Carb Mod; Heart Healthy; Low salt Equipment Needs: TBD Special Service Needs: None Additional Information: None Pt/Family Agrees to Admission and willing to participate: Yes Program Orientation Provided & Reviewed with Pt/Caregiver Including Roles  & Responsibilities: Yes Additional Information Needs: None Information Needs to be Provided By: N/A  Decrease burden of Care through IP rehab admission: No  Possible need for SNF placement upon discharge: Not anticipated   Patient Condition: This patient's medical and functional status has changed since the  consult dated 05/07/16 in which the Rehabilitation Physician determined and documented that the patient was potentially appropriate for intensive rehabilitative care in an inpatient rehabilitation facility. Issues have been addressed and update has been discussed with Dr. Letta Pate  and  patient now appropriate for inpatient rehabilitation. Will admit to inpatient rehab today.   Preadmission Screen Completed By:  Gunnar Fusi, 05/08/2016 2:09 PM ______________________________________________________________________   Discussed status with Dr. Letta Pate on 05/08/16 at 1505 and received telephone approval for admission today.  Admission Coordinator:  Gunnar Fusi, time 1505/Date 05/08/16

## 2016-05-08 NOTE — Progress Notes (Signed)
Patient admitted on CIR room [phone] with personal belongings. Patient and patient's wife given admission packet, and reviewed safety plan/agreement, and understanding verbalized. On assessment noted skin tear (L) forearm- allevyn placed, and stage I to sacrum-allevyn placed. Will continue to monitor.

## 2016-05-08 NOTE — H&P (Signed)
Physical Medicine and Rehabilitation Admission H&P       Chief Complaint  Patient presents with  . Dizziness, occasional double vision and balance deficits    HPI: Richard Buttram Robertsis a 82 y.o.malewith history of A fib-on xarelto, B 12 deficiency, T2DM, HTN, OSA, CHB s/p PPM, cognitive impairements who was admitted on 05/05/16 with complaints of weakness, vertigo and double vision. CT head negative for acute changes. CTA head/neck done revealing no occlusions, dissection or significant stenosis. Patient with nystagmus and RUE/RLE dysmetria felt to be due to small cerebellar stroke. Neurology recommended adding low dose ASA to posterior circulation infarct likely due to SVD and continuing Xarelto. Speech therapy evaluation revealed baseline dysarthria and severe cognitive deficits. Therapy ongoing and patient with balance deficits , dizziness and nystagmus with head turn. CIR recommended for follow up therapy.    Review of Systems  HENT: Negative for hearing loss and tinnitus.   Eyes: Positive for blurred vision. Negative for double vision.  Respiratory: Negative for cough, shortness of breath and stridor.   Cardiovascular: Negative for chest pain and palpitations.  Gastrointestinal: Negative for abdominal pain and heartburn.  Genitourinary: Positive for urgency. Negative for dysuria and frequency.  Musculoskeletal: Positive for falls (for the past year--negative work up).       Decline in gait since Dec--legs give away  Neurological: Positive for dizziness. Negative for weakness and headaches.  Psychiatric/Behavioral: The patient is nervous/anxious and has insomnia (didn't sleep last night).           Past Medical History:  Diagnosis Date  . Anxiety   . Arthritis   . Atrial fibrillation ([place])    a. Dx 03/2013, notes report atrial fibrillation/atrial flutter, placed on amiodarone. NSR in subsequent OV's.  . B12 deficiency anemia   . Chest pain    a. H/o CTA  negative for PE 2012, normal cath 2005, normal nucs previously including 05/2012.  Marland Kitchen Complete heart block (HCC)    a. s/p Medtronic Adapta L model ADDRL 1 (serial number [id number]) pacemaker.  Marland Kitchen GERD (gastroesophageal reflux disease)   . Hiatal hernia   . Hyperlipidemia   . Hypertension   . Iron deficiency anemia   . LBBB (left bundle branch block)   . Microcytic anemia   . Nephrolithiasis   . On home oxygen therapy    a. 2L w/CPAP at night  . Orthostatic hypotension   . OSA on CPAP    cpap  . [place] spotted fever ~ 1945  . Sinus drainage   . Skin cancer of lip   . Type II diabetes mellitus ([place])          Past Surgical History:  Procedure Laterality Date  . CARDIAC CATHETERIZATION     by Dr. Acie Fredrickson, January 24, 2004, that shows minimal coronary artery irregularities and normal left ventricular function  . CATARACT EXTRACTION W/ INTRAOCULAR LENS  IMPLANT, BILATERAL Bilateral   . ESOPHAGOGASTRODUODENOSCOPY (EGD) WITH PROPOFOL N/A 06/21/2015   Procedure: ESOPHAGOGASTRODUODENOSCOPY (EGD) WITH PROPOFOL;  Surgeon: Gatha Mayer, MD;  Location: [facility];  Service: Endoscopy;  Laterality: N/A;  . INGUINAL HERNIA REPAIR Right   . LUMBAR DISC SURGERY  ~ 1993  . PERMANENT PACEMAKER INSERTION N/A 06/03/2014   MDT Adapta L implanted by Dr Rayann Heman for syncope and transient AV block  . SKIN CANCER EXCISION     "lower lip" (04/06/2013)  . TEMPORARY PACEMAKER INSERTION N/A 05/31/2014   Procedure: Candis Shine;  Surgeon: Sinclair Grooms, MD;  Location: [facility] CATH [facility];  Service: Cardiovascular;  Laterality: N/A;  . VASECTOMY     Hx of           Family History  Problem Relation Age of Onset  . Thyroid disease Mother     goiter  . Pneumonia Father   . Other      Parents both died of old age; other conditions not known  . Stroke Brother   . Breast cancer Sister     Social History:  Married. Independent PTA. Per reports that he  has quit smoking. His smoking use included Cigarettes. He smoked 1.00 packs per day for 20.00 years. He has never used smokeless tobacco. He reports that he does not drink alcohol or use drugs.         Allergies  Allergen Reactions  . Codeine Other (See Comments)    Hallucinations  . Citalopram Swelling and Rash           Medications Prior to Admission  Medication Sig Dispense Refill  . acetaminophen (TYLENOL) 500 MG tablet Take 500 mg by mouth 2 (two) times daily.     Marland Kitchen amiodarone (PACERONE) 200 MG tablet Take 200 mg by mouth 3 (three) times a week. On Monday, Wednesday & Friday    . amLODipine (NORVASC) 2.5 MG tablet Take 1 tablet (2.5 mg total) by mouth daily. 30 tablet 11  . benazepril (LOTENSIN) 40 MG tablet Take 40 mg by mouth daily.      . cyanocobalamin (,VITAMIN B-12,) 1000 MCG/ML injection Inject 1,000 mcg into the muscle every 30 (thirty) days.    . diazepam (VALIUM) 5 MG tablet Take 5 mg by mouth at bedtime.      . fluticasone (FLONASE) 50 MCG/ACT nasal spray Place 1 spray into the nose daily as needed for allergies or rhinitis.     . metFORMIN (GLUCOPHAGE) 1000 MG tablet Take 1,000 mg by mouth 2 (two) times daily with a meal.    . metoprolol (LOPRESSOR) 50 MG tablet TAKE 1 TABLET BY MOUTH TWICE A DAY 60 tablet 11  . omeprazole (PRILOSEC) 20 MG capsule Take 20 mg by mouth daily.     . OXYGEN Inhale into the lungs at bedtime. CPAP with OXYGEN    . pioglitazone (ACTOS) 30 MG tablet Take 30 mg by mouth daily.      . sertraline (ZOLOFT) 100 MG tablet Take 1 tablet by mouth daily.    Carlena Hurl 20 MG TABS tablet TAKE 1 TABLET (20 MG TOTAL) BY MOUTH DAILY WITH SUPPER. 30 tablet 11    Home: Home Living Family/patient expects to be discharged to:: Private residence Living Arrangements: Spouse/significant other Available Help at Discharge: Family Type of Home: House Home Access: Stairs to enter Secretary/administrator of Steps: 2-3 Home Layout:  Two level, Able to live on main level with bedroom/bathroom (basement) Bathroom Shower/Tub: Health visitor: Handicapped height Home Equipment: Information systems manager, Grab bars - tub/shower, Medical laboratory scientific officer - quad  Lives With: Spouse   Functional History: Prior Function Level of Independence: Independent Comments: Walks short distances (~30-40 feet). Uses cane for distance if needed. Drives. Hx of falls, increased falls recently.  Functional Status:  Mobility: Bed Mobility Overal bed mobility: Needs Assistance Bed Mobility: Supine to Sit Rolling: Min guard Sidelying to sit: Min assist Sit to supine: Min assist General bed mobility comments: pt having some trouble getting fully upright without assist Transfers Overall transfer level: Needs assistance Equipment used: 2 person hand held assist Transfers: Sit  to/from Stand, Risk manager Sit to Stand: Min assist, Mod assist General transfer comment: assist coming forward mod assist the lower down to the floor Ambulation/Gait Ambulation/Gait assistance: Mod assist (needed +2 for safety and not available) Ambulation Distance (Feet): 50 Feet (additional 10x2 to/from [facility]) Assistive device: Rolling walker (2 wheeled) Gait Pattern/deviations: Step-through pattern, Ataxic, Scissoring, Shuffle General Gait Details: mildly ataxic, left shuffled gait with incoordination and instability with RW.  Control needs to be exerted onto patient to help him stay under control with LE and trunk Gait velocity interpretation: Below normal speed for age/gender    ADL: ADL Overall ADL's : Needs assistance/impaired Eating/Feeding: Set up, Bed level Grooming: Minimal assistance, Sitting Upper Body Bathing: Minimal assitance, Sitting Lower Body Bathing: Moderate assistance, +2 for physical assistance, Sit to/from stand Upper Body Dressing : Minimal assistance, Sitting Lower Body Dressing: Moderate assistance, Sit to/from stand, +2 for physical  assistance Functional mobility during ADLs: Maximal assistance, +2 for physical assistance (for sit to stand only) General ADL Comments: +2 assist for sit to stand due to dizziness and unsteadiness. Assisted PT with vestibular eval.  Cognition: Cognition Overall Cognitive Status: History of cognitive impairments - at baseline Arousal/Alertness: Awake/alert Orientation Level: Oriented X4 Attention: Sustained Sustained Attention: Appears intact Memory: Impaired Memory Impairment: Retrieval deficit (recalled 1/5 words independently, 1/5 with category cue, 3/5 with multiple choice cue) Awareness: Appears intact (aware of premorbid memory and new vision deficits) Problem Solving: Appears intact (looking for call bell in his bed when could not locate) Safety/Judgment: Appears intact (need to call for assistance) Comments: pt acknowledges wife checks his medications to assure he is taking properly -  Cognition Arousal/Alertness: Awake/alert Behavior During Therapy: Flat affect Overall Cognitive Status: History of cognitive impairments - at baseline   Blood pressure (!) 153/65, pulse 68, temperature 98.3 F (36.8 C), temperature source Oral, resp. rate 20, height '6\' 3"'$  (1.905 m), weight 113.2 kg (249 lb 9 oz), SpO2 94 %. Physical Exam  Nursing note and vitals reviewed. Constitutional: He is oriented to person, place, and time. He appears well-developed and well-nourished. No distress.  HENT:  Head: Normocephalic and atraumatic.  Mouth/Throat: Oropharynx is clear and moist.  Eyes: Conjunctivae are normal. Pupils are equal, round, and reactive to light.  Neck: Normal range of motion. Neck supple.  Cardiovascular: Normal rate and regular rhythm.   Respiratory: Effort normal and breath sounds normal.  GI: Soft. Bowel sounds are normal. He exhibits no distension. There is no tenderness.  Musculoskeletal: He exhibits no edema or tenderness.  Neurological: He is alert and oriented to person,  place, and time.  Fatigued appearing.   Left facial weakness with moderate to severe dysarthria.   Very HOH. Able to follow basic one and two step commands without difficulty.   Motor: 4+/5 throughout  dysmetria, right greater than left. Finger-nose-finger and heel-to-shin  Sensory intact to light touch.  Proprioception intact  Skin: Skin is warm and dry. He is not diaphoretic.  Multiple bruises BUE and right shin  Psychiatric: He has a normal mood and affect. His behavior is normal.    Lab Results Last 48 Hours        Results for orders placed or performed during the hospital encounter of 05/05/16 (from the past 48 hour(s))  Glucose, capillary     Status: Abnormal   Collection Time: 05/06/16  5:18 PM  Result Value Ref Range   Glucose-Capillary 133 (H) 65 - 99 mg/dL  Glucose, capillary     Status:  Abnormal   Collection Time: 05/06/16  9:31 PM  Result Value Ref Range   Glucose-Capillary 141 (H) 65 - 99 mg/dL   Comment 1 Notify RN    Comment 2 Document in Chart   Glucose, capillary     Status: Abnormal   Collection Time: 05/07/16  6:36 AM  Result Value Ref Range   Glucose-Capillary 108 (H) 65 - 99 mg/dL   Comment 1 Notify RN    Comment 2 Document in Chart   Glucose, capillary     Status: Abnormal   Collection Time: 05/07/16 11:42 AM  Result Value Ref Range   Glucose-Capillary 105 (H) 65 - 99 mg/dL  Glucose, capillary     Status: Abnormal   Collection Time: 05/07/16  4:07 PM  Result Value Ref Range   Glucose-Capillary 141 (H) 65 - 99 mg/dL  Glucose, capillary     Status: Abnormal   Collection Time: 05/07/16  9:52 PM  Result Value Ref Range   Glucose-Capillary 119 (H) 65 - 99 mg/dL   Comment 1 Notify RN    Comment 2 Document in Chart   Glucose, capillary     Status: Abnormal   Collection Time: 05/08/16  6:47 AM  Result Value Ref Range   Glucose-Capillary 135 (H) 65 - 99 mg/dL  CBC with Differential/Platelet     Status: Abnormal   Collection  Time: 05/08/16  6:57 AM  Result Value Ref Range   WBC 4.4 4.0 - 10.5 K/uL   RBC 3.18 (L) 4.22 - 5.81 MIL/uL   Hemoglobin 9.5 (L) 13.0 - 17.0 g/dL   HCT 30.8 (L) 39.0 - 52.0 %   MCV 96.9 78.0 - 100.0 fL   MCH 29.9 26.0 - 34.0 pg   MCHC 30.8 30.0 - 36.0 g/dL   RDW 16.0 (H) 11.5 - 15.5 %   Platelets 128 (L) 150 - 400 K/uL   Neutrophils Relative % 65 %   Neutro Abs 2.8 1.7 - 7.7 K/uL   Lymphocytes Relative 18 %   Lymphs Abs 0.8 0.7 - 4.0 K/uL   Monocytes Relative 13 %   Monocytes Absolute 0.6 0.1 - 1.0 K/uL   Eosinophils Relative 3 %   Eosinophils Absolute 0.2 0.0 - 0.7 K/uL   Basophils Relative 1 %   Basophils Absolute 0.0 0.0 - 0.1 K/uL  Basic metabolic panel     Status: Abnormal   Collection Time: 05/08/16  6:57 AM  Result Value Ref Range   Sodium 140 135 - 145 mmol/L   Potassium 3.6 3.5 - 5.1 mmol/L   Chloride 104 101 - 111 mmol/L   CO2 28 22 - 32 mmol/L   Glucose, Bld 131 (H) 65 - 99 mg/dL   BUN 19 6 - 20 mg/dL   Creatinine, Ser 1.21 0.61 - 1.24 mg/dL   Calcium 8.9 8.9 - 10.3 mg/dL   GFR calc non Af Amer 54 (L) >60 mL/min   GFR calc Af Amer >60 >60 mL/min    Comment: (NOTE) The eGFR has been calculated using the CKD EPI equation. This calculation has not been validated in all clinical situations. eGFR's persistently <60 mL/min signify possible Chronic Kidney Disease.   Anion gap 8 5 - 15  Glucose, capillary     Status: Abnormal   Collection Time: 05/08/16 11:23 AM  Result Value Ref Range   Glucose-Capillary 132 (H) 65 - 99 mg/dL   Comment 1 Notify RN    Comment 2 Document in Chart  Imaging Results (Last 48 hours)  Ct Head Wo Contrast  Result Date: 05/08/2016 CLINICAL DATA:  Headache.  Prior stroke EXAM: CT HEAD WITHOUT CONTRAST TECHNIQUE: Contiguous axial images were obtained from the base of the skull through the vertex without intravenous contrast. COMPARISON:  Head CT 05/05/2016 FINDINGS: Brain: No mass lesion,  intraparenchymal hemorrhage or extra-axial collection. No evidence of acute cortical infarct. There is confluent periventricular hypoattenuation compatible with chronic microvascular ischemia. Vascular: There is calcific atherosclerosis of the intracranial internal carotid and vertebral arteries. Skull: Normal visualized skull base, calvarium and extracranial soft tissues. Sinuses/Orbits: There is an atelectatic left maxillary sinus with internal frothy secretions. The other paranasal sinuses are normal. No mastoid or middle ear effusion. Normal orbits. IMPRESSION: 1. No acute intracranial abnormality. 2. Chronic microvascular ischemia. Electronically Signed   By: Ulyses Jarred M.D.   On: 05/08/2016 05:32        Medical Problem List and Plan: 1.  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  DVT Prophylaxis/Anticoagulation: Pharmaceutical: Xarelto 3. Pain Management: N/A 4. Anxiety disorder/Mood: On Zoloft 100 mg daily as well as valium at bedtime (for years per wife). LCSW to follow for evaluation and support.  5. Neuropsych: This patient is capable of making decisions on his own behalf. 6. Skin/Wound Care: routine pressure relief measures.  7. Fluids/Electrolytes/Nutrition:   Maintain adequate nutrition and hydration status.  Monitor I/O. Check lytes in am.  8.T2DM: Was on metformin 1000 mg bid and actos 30 mg daily--Hgb A1c- 5.7.  Resume actos and monitor BS ac/hs for now. Continue SSI for elevated BS 9. A Fib: Monitor heart rate bid. Continue amiodarone--3 X weekly, metoprolol bid and Xarelto 10. Iron deficiency anemia/B 12 deficiency: Followed by Hem/Onc--Treated with iron infusion on outpatient basis.  Received monthly B 12 supplement today 11. OSA:  Resume CPAP with 2 Loxygen at nights.  12. CHB:  PPM in place. H/o orthostatic symptoms.  13. HTN: Monitor BID--off Norvasc at this time.   14. Chronic insomnia: Has been off Valium since admission.     Post Admission  Physician Evaluation: 1. Functional deficits secondary  to cerebellar infarct. 2. Patient is admitted to receive collaborative, interdisciplinary care between the physiatrist, rehab nursing staff, and therapy team. 3. Patient's level of medical complexity and substantial therapy needs in context of that medical necessity cannot be provided at a lesser intensity of care such as a SNF. 4. Patient has experienced substantial functional loss from his/her baseline which was documented above under the "Functional History" and "Functional Status" headings.  Judging by the patient's diagnosis, physical exam, and functional history, the patient has potential for functional progress which will result in measurable gains while on inpatient rehab.  These gains will be of substantial and practical use upon discharge  in facilitating mobility and self-care at the household level. 5. Physiatrist will provide 24 hour management of medical needs as well as oversight of the therapy plan/treatment and provide guidance as appropriate regarding the interaction of the two. 6. 24 hour rehab nursing will assist with bowel management, safety, skin/wound care, disease management, medication administration, pain management and patient education  and help integrate therapy concepts, techniques,education, etc. 7. PT will assess and treat for/with: pre gait, gait training, endurance , safety, equipment, neuromuscular re education.   Goals are: Sup. 8. OT will assess and treat for/with: ADLs, Cognitive perceptual skills, Neuromuscular re education, safety, endurance, equipment.   Goals are: Mod I. Therapy May proceed with showering this patient. 9. SLP  will assess and treat for/with: NA.  Goals are: NA. 10. Case Management and Social Worker will assess and treat for psychological issues and discharge planning. 11. Team conference will be held weekly to assess progress toward goals and to determine barriers to discharge. 12. Patient  will receive at least 3 hours of therapy per day at least 5 days per week. 13. ELOS: 14-17d       14. Prognosis:  good     [name] M.D. [facility] FAAPM&R (Sports Med, Neuromuscular Med) Diplomate [place] Board of Electrodiagnostic Med  05/08/2016

## 2016-05-08 NOTE — Progress Notes (Signed)
Physical Therapy Treatment Patient Details Name: Richard [name] MRN: [id number] DOB: [birth year] Today's Date: 05/08/2016    History of Present Illness 82 y.o. male with known A. fib status post pacemaker, hyperlipidemia, diabetes, hypertension, on Xarelto. Patient was brought to the hospital after waking up yesterday and feeling sudden onset of vertigo. He also endorses generalized weakness. CT on 9/11 negative for acute infarct. Unable to obtain MRI due to pacemaker.       PT Comments    Pt progressing slowly, dizziness decreasing, double vision resolved.  Pt still difficult to mobilize easily due to truncal and LE ataxia/incoordination.  Follow Up Recommendations  CIR     Equipment Recommendations  Other (comment) (TBA)    Recommendations for Other Services       Precautions / Restrictions Precautions Precautions: Fall    Mobility  Bed Mobility Overal bed mobility: Needs Assistance Bed Mobility: Supine to Sit   Sidelying to sit: Min assist       General bed mobility comments: Grabbing for rail, stability assist  Transfers Overall transfer level: Needs assistance   Transfers: Sit to/from Stand Sit to Stand: Mod assist         General transfer comment: assist coming forward and stability  Ambulation/Gait Ambulation/Gait assistance: Mod assist Ambulation Distance (Feet): 80 Feet Assistive device: Rolling walker (2 wheeled) Gait Pattern/deviations: Step-through pattern   Gait velocity interpretation: at or above normal speed for age/gender General Gait Details: ataxia and incoordination of trunk and LE's necessitate assisting pt even with the RW   Stairs            Wheelchair Mobility    Modified Rankin (Stroke Patients Only) Modified Rankin (Stroke Patients Only) Pre-Morbid Rankin Score: No significant disability Modified Rankin: Moderately severe disability     Balance Overall balance assessment: Needs assistance Sitting-balance support:  Feet supported Sitting balance-Leahy Scale: Fair     Standing balance support: During functional activity Standing balance-Leahy Scale: Poor Standing balance comment: support needed especially if eyes closed.  Washing his face after shaving task pt quickly lost his balance to the left, not able to control his slam into the wall.                    Cognition Arousal/Alertness: Awake/alert Behavior During Therapy: WFL for tasks assessed/performed (oblivious) Overall Cognitive Status: History of cognitive impairments - at baseline                      Exercises      General Comments General comments (skin integrity, edema, etc.): Double vision and dizziness have resolved or decreased per pt.  Mobility is generally hindered and made unsafe by ataxia/incoordination of the trunk and LE's      Pertinent Vitals/Pain Pain Assessment: No/denies pain    Home Living                      Prior Function            PT Goals (current goals can now be found in the care plan section) Acute Rehab PT Goals Patient Stated Goal: get better PT Goal Formulation: With patient Time For Goal Achievement: 05/20/16 Potential to Achieve Goals: Good Progress towards PT goals: Progressing toward goals    Frequency  Min 3X/week    PT Plan Current plan remains appropriate    Co-evaluation PT/OT/SLP Co-Evaluation/Treatment: Yes Reason for Co-Treatment: For patient/therapist safety PT goals addressed during session: Mobility/safety with mobility  End of Session Equipment Utilized During Treatment: Gait belt   Patient left: in chair;with call bell/phone within reach;with chair alarm set;with family/visitor present     Time: [id number] PT Time Calculation (min) (ACUTE ONLY): 47 min  Charges:  $Gait Training: 8-22 mins $Therapeutic Activity: 8-22 mins                    G Codes:      Conni Knighton, [name] 05/08/2016, 11:38 AM 05/08/2016  Donnella Sham,  PT [phone]  (pager)

## 2016-05-08 NOTE — Progress Notes (Signed)
STROKE TEAM PROGRESS NOTE   SUBJECTIVE (INTERVAL HISTORY) His wife, daughter, and granddaughter are at the bedside.  Overall he feels his condition is much improved, vertigo and diplopia resolved. He is pending for CIR admission today.    OBJECTIVE Temp:  [97.7 F (36.5 C)-98.3 F (36.8 C)] 98.3 F (36.8 C) (09/14 0934) Pulse Rate:  [59-69] 68 (09/14 0934) Cardiac Rhythm: Atrial paced (09/14 1134) Resp:  [18-20] 20 (09/14 0934) BP: (152-173)/(59-76) 153/65 (09/14 0934) SpO2:  [93 %-97 %] 94 % (09/14 0934)   Recent Labs Lab 05/07/16 1142 05/07/16 1607 05/07/16 2152 05/08/16 0647 05/08/16 1123  GLUCAP 105* 141* 119* 135* 132*    Recent Labs Lab 05/05/16 1035 05/08/16 0657  NA 140 140  K 3.8 3.6  CL 103 104  CO2 26 28  GLUCOSE 188* 131*  BUN 13 19  CREATININE 1.04 1.21  CALCIUM 8.9 8.9    Recent Labs Lab 05/05/16 1035  AST 32  ALT 22  ALKPHOS 77  BILITOT 0.6  PROT 7.3  ALBUMIN 3.5    Recent Labs Lab 05/05/16 1035 05/08/16 0657  WBC 5.2 4.4  NEUTROABS 3.9 2.8  HGB 10.0* 9.5*  HCT 33.2* 30.8*  MCV 97.9 96.9  PLT 145* 128*    Recent Labs Lab 05/05/16 1634  TROPONINI <0.03    Recent Labs  05/05/16 1634  LABPROT 15.5*  INR 1.22   No results for input(s): COLORURINE, LABSPEC, PHURINE, GLUCOSEU, HGBUR, BILIRUBINUR, KETONESUR, PROTEINUR, UROBILINOGEN, NITRITE, LEUKOCYTESUR in the last 72 hours.  Invalid input(s): APPERANCEUR     Component Value Date/Time   CHOL 136 05/06/2016 0531   TRIG 70 05/06/2016 0531   HDL 38 (L) 05/06/2016 0531   CHOLHDL 3.6 05/06/2016 0531   VLDL 14 05/06/2016 0531   LDLCALC 84 05/06/2016 0531   Lab Results  Component Value Date   HGBA1C 5.7 (H) 05/06/2016      Component Value Date/Time   LABOPIA NONE DETECTED 05/05/2016 1118   COCAINSCRNUR NONE DETECTED 05/05/2016 1118   LABBENZ POSITIVE (A) 05/05/2016 1118   AMPHETMU NONE DETECTED 05/05/2016 1118   THCU NONE DETECTED 05/05/2016 1118   LABBARB NONE  DETECTED 05/05/2016 1118    No results for input(s): ETH in the last 168 hours.   Imaging  I have personally reviewed the radiological images below and agree with the radiology interpretations.  Ct Angio Head And neck W/cm &/or Wo Cm 05/05/2016 IMPRESSION: No occlusion, aneurysm, dissection, or significant stenosis of the circle of Willis or carotid and vertebral arteries of the neck is identified. Mild mixed plaque of the carotid bifurcations and intracranial calcific atherosclerosis.   Ct Head Wo Contrast 05/05/2016 IMPRESSION: 1. Evidence of progressed chronic small vessel disease in the left thalamus since 2015. Underlying chronic white matter disease. 2.  No acute intracranial abnormality identified.   05/08/2016 1. No acute intracranial abnormality. 2. Chronic microvascular ischemia.  2D Echocardiogram  - Left ventricle: Abnormal septal motion The cavity size was   normal. Wall thickness was increased in a pattern of mild LVH.   Systolic function was normal. The estimated ejection fraction was   in the range of 60% to 65%. Wall motion was normal; there were no   regional wall motion abnormalities. - Mitral valve: There was mild regurgitation. Valve area by   continuity equation (using LVOT flow): 3.11 cm^2. - Left atrium: The atrium was moderately dilated. - Right atrium: ? calcified chiari network in RA. - Atrial septum: No defect or patent foramen  ovale was identified.  EKG  sinus rhythm   PHYSICAL EXAM  Temp:  [97.7 F (36.5 C)-98.3 F (36.8 C)] 98.3 F (36.8 C) (09/14 0934) Pulse Rate:  [59-69] 68 (09/14 0934) Resp:  [18-20] 20 (09/14 0934) BP: (152-173)/(59-76) 153/65 (09/14 0934) SpO2:  [93 %-97 %] 94 % (09/14 0934)  General - obese, well developed, in no apparent distress.  Ophthalmologic - fundi not visualized due to eye movement.  Cardiovascular - Regular rate and rhythm with no murmur, not in A. fib.  Mental Status -  Level of arousal and orientation  to time, place, and person were intact. Language including expression, naming, repetition, comprehension was assessed and found intact. Fund of Knowledge was assessed and was impaired.  Cranial Nerves II - XII - II - Visual field intact OU. III, IV, VI - Extraocular movements intact. V - Facial sensation intact bilaterally. VII - Facial movement intact bilaterally. VIII - Hearing & vestibular intact bilaterally, no nystagmus. X - Palate elevates symmetrically. XI - Chin turning & shoulder shrug intact bilaterally. XII - Tongue protrusion intact.  Motor Strength - The patient's strength was 4/5 in all extremities and pronator drift was absent.  Bulk was normal and fasciculations were absent.   Motor Tone - Muscle tone was assessed at the neck and appendages and was normal.  Reflexes - The patient's reflexes were symmetrical in all extremities and he had no pathological reflexes.  Sensory - Light touch, temperature/pinprick were assessed and were symmetrical.    Coordination - The patient had normal movements in the hands with no ataxia or dysmetria.  Tremor was absent.  Gait and Station - deferred to PT.   ASSESSMENT/PLAN Mr. CERRONE BARBUSH is a 82 y.o. male with history of A. fib on Xarelto, complete heart block s/p pacemaker, HLD, HTN, OSA on CPAP, DM admitted for acute onset vertigo and diplopia. Symptoms near resolved.    Stroke:  Likely posterior circulation small infarct likely secondary to small vessel disease source. However, A. fib as the etiology of stroke cannot be excluded.  MRI/MRA not able to perform due to pacer  CTA head and neck unremarkable  Repeat CT head - No acute intracranial abnormality.  2D Echo  EF 60-65%  LDL 97  HgbA1c 5.7  Xarelto for VTE prophylaxis  Diet heart healthy/carb modified Room service appropriate? Yes; Fluid consistency: Thin   Xarelto (rivaroxaban) daily prior to admission, now on Xarelto (rivaroxaban) daily.  Aspirin 81 mg  daily added for further stroke prevention.  Patient counseled to be compliant with his antithrombotic medications  Ongoing aggressive stroke risk factor management  Therapy recommendations:  CIR recommended  Disposition:  Pending  Atrial fibrillation  On Xarelto  Patient stated compliance with Xarelto  Continue Xarelto on discharge  Diabetes  HgbA1c 5.7, goal < 7.0  Controlled  Home meds, metformin  CBG monitoring  SSI  Hypertension  Home meds:   Amlodipine, Lotensin, metoprolol Gradually normalized BP within 5-7 days. Currently on metoprolol  Stable  Patient counseled to be compliant with his blood pressure medications  Hyperlipidemia  Home meds:  None   LDL 97, goal < 70  Add Lipitor 40  Continue statin at discharge  Other Stroke Risk Factors  Advanced age  Obesity, Body mass index is 31.19 kg/m.   Obstructive sleep apnea, on CPAP at home  Other Active Problems  Complete heart block on pacemaker  Mild anemia - 9.5 / 30.8  Other Pertinent History    Hospital day #  2  Neurology will sign off. Please call with questions. Pt will follow up with Cecille Rubin at [facility] in about 6 weeks. Thanks for the consult.  Rosalin Hawking, MD PhD Stroke Neurology 05/08/2016 3:17 PM    To contact Stroke Continuity provider, please refer to [email or web address] After hours, contact General Neurology

## 2016-05-08 NOTE — Progress Notes (Signed)
Occupational Therapy Treatment Patient Details Name: Richard [name] MRN: [id number] DOB: August 15, 1931 Today's Date: 05/08/2016    History of present illness 82 y.o. male with known A. fib status post pacemaker, hyperlipidemia, diabetes, hypertension, on Xarelto. Patient was brought to the hospital after waking up yesterday and feeling sudden onset of vertigo. He also endorses generalized weakness. CT on 9/11 negative for acute infarct. Unable to obtain MRI due to pacemaker.      OT comments  Pt able to perform toilet transfer with mod assist +2 for safety, min assist for peri care in standing, and min guard-mod assist for grooming activity due to LOB x2 during functional task with vision occluded. At this time, pt is a very high fall risk; discussed this with pt and family. Pt not c/o dizziness or visual disturbances this session. Updated d/c recommendations to CIR for follow up therapy to maximize independence and safety with ADL and functional mobility prior to return home. Will continue to follow pt acutely.   Follow Up Recommendations  CIR;Supervision/Assistance - 24 hour    Equipment Recommendations  Other (comment) (TBD at next venue)    Recommendations for Other Services      Precautions / Restrictions Precautions Precautions: Fall Restrictions Weight Bearing Restrictions: No       Mobility Bed Mobility Overal bed mobility: Needs Assistance Bed Mobility: Supine to Sit   Sidelying to sit: Min assist       General bed mobility comments: Grabbing for rail, stability assist  Transfers Overall transfer level: Needs assistance   Transfers: Sit to/from Stand Sit to Stand: Mod assist         General transfer comment: assist coming forward and stability    Balance Overall balance assessment: Needs assistance Sitting-balance support: Feet supported;No upper extremity supported Sitting balance-Leahy Scale: Fair     Standing balance support: No upper extremity  supported;During functional activity Standing balance-Leahy Scale: Poor Standing balance comment: support needed especially if eyes closed. Washing his face after shaving task pt quickly lost his balance to the left, not able to control his slam into the wall.                   ADL Overall ADL's : Needs assistance/impaired     Grooming: Standing;Wash/dry face (Shaving) Grooming Details (indicate cue type and reason): Min guard to mod assist provided at times for LOB. Pt with LOB x2 when vision occluded; fall to L side and required physical assist to correct.         Upper Body Dressing : Minimal assistance;Sitting Upper Body Dressing Details (indicate cue type and reason): to don hospital gown Lower Body Dressing: Min guard (sitting only to adjust socks)   Toilet Transfer: Moderate assistance;+2 for safety/equipment;Ambulation;BSC;RW   Toileting- Clothing Manipulation and Hygiene: Minimal assistance;Sit to/from stand Toileting - Clothing Manipulation Details (indicate cue type and reason): Pt able to perfrom pericare after BM with physical support for balance and use of RW to steady.     Functional mobility during ADLs: Moderate assistance;Rolling walker General ADL Comments: No c/o of dizziness or visual disturbance today but very unsteady in standing. Pt with LOB x2 during grooming task with vision occluded; mod assist to correct. Discussed CIR level therapies with pt and family; feel pt needs more therapy prior to return home as he is a very high fall risk at this time.      Vision  Perception     Praxis      Cognition   Behavior During Therapy: WFL for tasks assessed/performed (oblivious) Overall Cognitive Status: History of cognitive impairments - at baseline                       Extremity/Trunk Assessment               Exercises     Shoulder Instructions       General Comments      Pertinent Vitals/ Pain        Pain Assessment: No/denies pain  Home Living                                          Prior Functioning/Environment              Frequency Min 2X/week     Progress Toward Goals  OT Goals(current goals can now be found in the care plan section)  Progress towards OT goals: Progressing toward goals  Acute Rehab OT Goals Patient Stated Goal: get better OT Goal Formulation: With patient/family  Plan Discharge plan needs to be updated;Equipment recommendations need to be updated    Co-evaluation    PT/OT/SLP Co-Evaluation/Treatment: Yes Reason for Co-Treatment: For patient/therapist safety PT goals addressed during session: Mobility/safety with mobility OT goals addressed during session: ADL's and self-care      End of Session Equipment Utilized During Treatment: Gait belt;Rolling walker   Activity Tolerance Patient tolerated treatment well   Patient Left in chair;with call bell/phone within reach;with chair alarm set;with nursing/sitter in room   Nurse Communication Mobility status;Other (comment) (IV site bleeding)        Time: [id number] OT Time Calculation (min): 48 min  Charges: OT General Charges $OT Visit: 1 Procedure OT Treatments $Self Care/Home Management : 23-37 mins  Binnie Kand M.S., OTR/L Pager: [phone]  05/08/2016, 12:11 PM

## 2016-05-08 NOTE — Progress Notes (Signed)
Inpatient Rehabilitation  Met with patient and family.  Discussed patient's current deficits and therapy needs.  Following family encouragement patient agreeable to IP Rehab prior to going home.  I await medical clearance from stroke team and attending prior to admission; however, hopeful for admission today.  Please call with questions.    Carmelia Roller., CCC/SLP Admission Coordinator  [place]  Cell [phone]

## 2016-05-08 NOTE — Progress Notes (Signed)
Inpatient Rehabilitation  Met with patient, wife, and family at bedside today to discuss team's recommendation for IP Rehab.  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  Educated spouse on the importance addressing balance deficits prior to discharge home to reduce burden of care and fall risk given that she herself can not physically assist him.  She stated that the patient would need to be convinced.  Plan to follow up later today after therapy has finished working with patient.  Will follow for timing of medical readiness, patient agreement, and bed availability.  Please call with questions.   Carmelia Roller., CCC/SLP Admission Coordinator  [place]  Cell [phone]

## 2016-05-08 NOTE — Discharge Summary (Signed)
Physician Discharge Summary  Richard [name] [id number] DOB: 06/07/1931 DOA: 80/06/2016  PCP: Tivis Ringer, MD  Admit date: 05/05/2016 Discharge date: 05/08/2016  Admitted From: Home Disposition:  CIR   Recommendations for Outpatient Follow-up:  1. Follow up with PCP in 1-2 weeks 2. Follow up with Neurology in 6-8 weeks   Home Health: N/A, going to CIR  Equipment/Devices:None   Discharge Condition: Stable CODE STATUS:Full Diet recommendation: Heart Healthy   Brief/Interim Summary: Richard Guenette Robertsis a 82 y.o.malewith a history of complete heart block with PPM, OSA on CPAP, DM, anxiety, HLD, HTN, iron deficiency anemia, nephrolithiasis, andRMSF who presented 9/11 with generalized weakness and vertigo. Patient is a poor historian, but reports sudden onset of vertigo while bending downward with rotational L>R component associated with nausea. He's generally sedentary and has been more fatigued and felt diffusely weak for the past few days. Orthostatic vital signs were negative, CT scan unrevealing, and unfortunately his medtronic pacemaker is not compatible with MRI. Neurology has been consulted. He underwent stroke workup including CTA head and neck which were negative, echo with mild LVH and ejection fraction of 60-65%, LDL of 84, was started on Lipitor, H A1c of 5.8, PT, OT, SLP evaluation. Neurology felt that patient's symptoms were more likely to be due to cerebellar stroke rather than vertigo. Unfortunately, pacemaker was notable with MRI. Repeat CT head was obtained which showed no acute intracranial abnormalities and showed chronic microvascular ischemia. Patient was started on aspirin on top of his Xarelto which he takes for atrial fibrillation. Patient was evaluated by PT/OT who recommended patient admission to CIR.  Discharge Diagnoses:  Principal Problem:   Cerebellar stroke, acute ([place]) Active Problems:   Benign essential HTN   OSA (obstructive sleep apnea)   Diabetes  mellitus (HCC)   Hiatal hernia   Atrial fibrillation (HCC)   Anemia   GERD (gastroesophageal reflux disease)   B12 deficiency anemia   Anemia, iron deficiency   Generalized weakness   Hyperlipidemia   Chronic anticoagulation   Cardiac pacemaker in situ   Acute blood loss anemia   Cognitive deficits   Cognitive deficit due to recent stroke   Cognitive deficit due to recent cerebrovascular accident (CVA)  Vertigo, likely cerebellar stroke rather than BPPV  - CT without acute hemorrhage, CTA head and neck negative, and MRI not possible due to pacemaker - Echo: mild LVH, EF 60-65%  - LDL 84: Lipitor  - HbA1c 5.8 - PT/OT: recommending CIR.  - Neurology/Stroke team: suspected small cerebellar infarct.  - Repeat Ct: no acute intracracial abnl, chronic microvascular ischemia  - Start aspirin as well as continue xarelto.   Atrial fibrillation on xarelto - Continue metoprolol, amiodarone, xarelto - Tele   Third degree heart block s/p pacemaker    General weakness/deconditioning: Unknown chronicity in sedentary elderly male 82 Thought to be underlying but not primary cause of dizziness.  - PT/OT evaluation: recommending CIR  Essential hypertension - Continue home medications - Monitor  [id number], without complications, non-insulin dependent, well controlled - Hold home oral diabetic medications - metformin, actos  - SSI - Heart healthy,carb modified diet - Ha1c 5.7  Anemia of chronic disease: Chronic and stable  - Followup by dr. Whitney Muse   Depression - Continue Zoloft, trazodone   GERD - PPI   Discharge Instructions Continue all current medications per PMR and admit to CIR.      Allergies  Allergen Reactions  . Codeine Other (See Comments)    Hallucinations   . Citalopram  Swelling and Rash    Consultations:  Neurology, Dr. Leonel Ramsay    Procedures/Studies: Ct Angio Head W/cm &/or Wo Cm  Result Date: 05/05/2016 CLINICAL DATA:  82 y/o  M; acute  dizziness and generalized weakness. EXAM: CT ANGIOGRAPHY HEAD AND NECK TECHNIQUE: Multidetector CT imaging of the head and neck was performed using the standard protocol during bolus administration of intravenous contrast. Multiplanar CT image reconstructions and MIPs were obtained to evaluate the vascular anatomy. Carotid stenosis measurements (when applicable) are obtained utilizing NASCET criteria, using the distal internal carotid diameter as the denominator. CONTRAST:  75 cc Isovue 370 COMPARISON:  05/05/2016 CT head.  05/18/2013 MRI brain. FINDINGS: CTA NECK Aortic arch: Mild calcific atherosclerosis of the arch. Four vessel arch. Right carotid system: No occlusion, aneurysm, dissection, or significant stenosis is identified. Minimal mixed plaque at the bifurcation. Retropharyngeal course of common carotid artery. Left carotid system: No occlusion, aneurysm, dissection, or significant stenosis is identified. Minimal mixed plaque at the bifurcation. Retropharyngeal course of common carotid artery. Vertebral arteries:No occlusion, aneurysm, dissection, or significant stenosis is identified. Skeleton: Multilevel degenerative changes of the cervical spine most pronounced at the C5 through C7 levels. No high-grade bony canal stenosis or foraminal narrowing. Other neck: Small thyroid nodules the largest in the right lower pole measuring 10 patent aerodigestive tract. Inflammatory calcifications of palatini tonsils. Fatty atrophy of the right parotid gland and calcifications probably represent sequelae of prior parotiditis. No lymphadenopathy or discrete cervical mass is identified. Left maxillary sinus aerosolized secretions, chronic inflammatory changes, and atelectasis. Otherwise visualized paranasal sinuses and mastoids are clear. Bilateral intra-ocular lens replacement. Partially visualized pacemaker. Upper chest: Peripheral reticular changes in the lung probably represents mild fibrosis. CTA HEAD Anterior  circulation: Moderate calcific atherosclerosis of cavernous and paraclinoid internal carotid artery is bilateral middle cerebral arteries, anterior cerebral arteries, and internal carotid arteries are patent. Symmetric distal circulation. No occlusion, aneurysm, dissection, or significant stenosis is identified. Posterior circulation: Minimal calcification of the bilateral V4 segments. Bilateral vertebral arteries, the basilar artery, and posterior cerebral arteries are patent. Symmetric distal circulation. No occlusion, aneurysm, dissection, or significant stenosis is identified. Venous sinuses: No thrombus identified. Anatomic variants: Patent anterior communicating artery. Fetal left PCA. No right posterior communicating artery identified, hypoplastic or absent. Delayed phase: Small hyperdense focus within the right paramedian pons, dense on noncontrast CT, possibly mineralization related to old lacunar infarct. No definite abnormal enhancement. IMPRESSION: No occlusion, aneurysm, dissection, or significant stenosis of the circle of Willis or carotid and vertebral arteries of the neck is identified. Mild mixed plaque of the carotid bifurcations and intracranial calcific atherosclerosis. Electronically Signed   By: Kristine Garbe M.D.   On: 05/05/2016 20:36   Dg Chest 2 View  Result Date: 05/05/2016 CLINICAL DATA:  Weakness, dizziness and left-sided chest pain. EXAM: CHEST  2 VIEW COMPARISON:  06/04/2014 FINDINGS: The pacer wires are stable. The heart is enlarged but unchanged. There is marked tortuosity, ectasia and calcification of the thoracic aorta which appears stable. Mild chronic bronchitic changes with suspected superimposed interstitial pulmonary edema with peribronchial thickening and increased interseptal lines peripherally (Kerley B-lines) small bilateral pleural effusions. Stable hiatal hernia. IMPRESSION: 1. Cardiac enlargement, interstitial edema and small effusions suggesting CHF. 2.  Stable large hiatal hernia. 3. Stable right atrial and right ventricular pacer wires. Electronically Signed   By: Marijo Sanes M.D.   On: 05/05/2016 11:12   Ct Head Wo Contrast  Result Date: 05/08/2016 CLINICAL DATA:  Headache.  Prior stroke EXAM: CT  HEAD WITHOUT CONTRAST TECHNIQUE: Contiguous axial images were obtained from the base of the skull through the vertex without intravenous contrast. COMPARISON:  Head CT 05/05/2016 FINDINGS: Brain: No mass lesion, intraparenchymal hemorrhage or extra-axial collection. No evidence of acute cortical infarct. There is confluent periventricular hypoattenuation compatible with chronic microvascular ischemia. Vascular: There is calcific atherosclerosis of the intracranial internal carotid and vertebral arteries. Skull: Normal visualized skull base, calvarium and extracranial soft tissues. Sinuses/Orbits: There is an atelectatic left maxillary sinus with internal frothy secretions. The other paranasal sinuses are normal. No mastoid or middle ear effusion. Normal orbits. IMPRESSION: 1. No acute intracranial abnormality. 2. Chronic microvascular ischemia. Electronically Signed   By: Ulyses Jarred M.D.   On: 05/08/2016 05:32   Ct Head Wo Contrast  Result Date: 05/05/2016 CLINICAL DATA:  82 year old male with syncope, severe generalized weakness nausea and dizziness. Initial encounter. EXAM: CT HEAD WITHOUT CONTRAST TECHNIQUE: Contiguous axial images were obtained from the base of the skull through the vertex without intravenous contrast. COMPARISON:  Head CT without contrast 05/31/2014. Brain MRI 05/18/2013. FINDINGS: Brain: Chronic confluent cerebral white matter hypodensity bilaterally. New heterogeneity in the region of the left thalamus since 2015. No acute or chronic cortically based infarct identified. No midline shift, mass effect, or evidence of intracranial mass lesion. No ventriculomegaly. No acute intracranial hemorrhage identified. Vascular: Calcified  atherosclerosis at the skull base. A degree of chronic intracranial artery dolichoectasia is again noted. No suspicious intracranial vascular hyperdensity. Skull: No acute osseous abnormality identified. Sinuses/Orbits: Visualized paranasal sinuses and mastoids are stable and well pneumatized. Other: No acute orbit or scalp soft tissue findings. IMPRESSION: 1. Evidence of progressed chronic small vessel disease in the left thalamus since 2015. Underlying chronic white matter disease. 2.  No acute intracranial abnormality identified. Electronically Signed   By: Genevie Ann M.D.   On: 05/05/2016 12:46   Ct Angio Neck W Or Wo Contrast  Result Date: 05/05/2016 CLINICAL DATA:  82 y/o  M; acute dizziness and generalized weakness. EXAM: CT ANGIOGRAPHY HEAD AND NECK TECHNIQUE: Multidetector CT imaging of the head and neck was performed using the standard protocol during bolus administration of intravenous contrast. Multiplanar CT image reconstructions and MIPs were obtained to evaluate the vascular anatomy. Carotid stenosis measurements (when applicable) are obtained utilizing NASCET criteria, using the distal internal carotid diameter as the denominator. CONTRAST:  75 cc Isovue 370 COMPARISON:  05/05/2016 CT head.  05/18/2013 MRI brain. FINDINGS: CTA NECK Aortic arch: Mild calcific atherosclerosis of the arch. Four vessel arch. Right carotid system: No occlusion, aneurysm, dissection, or significant stenosis is identified. Minimal mixed plaque at the bifurcation. Retropharyngeal course of common carotid artery. Left carotid system: No occlusion, aneurysm, dissection, or significant stenosis is identified. Minimal mixed plaque at the bifurcation. Retropharyngeal course of common carotid artery. Vertebral arteries:No occlusion, aneurysm, dissection, or significant stenosis is identified. Skeleton: Multilevel degenerative changes of the cervical spine most pronounced at the C5 through C7 levels. No high-grade bony canal  stenosis or foraminal narrowing. Other neck: Small thyroid nodules the largest in the right lower pole measuring 10 patent aerodigestive tract. Inflammatory calcifications of palatini tonsils. Fatty atrophy of the right parotid gland and calcifications probably represent sequelae of prior parotiditis. No lymphadenopathy or discrete cervical mass is identified. Left maxillary sinus aerosolized secretions, chronic inflammatory changes, and atelectasis. Otherwise visualized paranasal sinuses and mastoids are clear. Bilateral intra-ocular lens replacement. Partially visualized pacemaker. Upper chest: Peripheral reticular changes in the lung probably represents mild fibrosis. CTA HEAD Anterior  circulation: Moderate calcific atherosclerosis of cavernous and paraclinoid internal carotid artery is bilateral middle cerebral arteries, anterior cerebral arteries, and internal carotid arteries are patent. Symmetric distal circulation. No occlusion, aneurysm, dissection, or significant stenosis is identified. Posterior circulation: Minimal calcification of the bilateral V4 segments. Bilateral vertebral arteries, the basilar artery, and posterior cerebral arteries are patent. Symmetric distal circulation. No occlusion, aneurysm, dissection, or significant stenosis is identified. Venous sinuses: No thrombus identified. Anatomic variants: Patent anterior communicating artery. Fetal left PCA. No right posterior communicating artery identified, hypoplastic or absent. Delayed phase: Small hyperdense focus within the right paramedian pons, dense on noncontrast CT, possibly mineralization related to old lacunar infarct. No definite abnormal enhancement. IMPRESSION: No occlusion, aneurysm, dissection, or significant stenosis of the circle of Willis or carotid and vertebral arteries of the neck is identified. Mild mixed plaque of the carotid bifurcations and intracranial calcific atherosclerosis. Electronically Signed   By: Kristine Garbe M.D.   On: 05/05/2016 20:36   Echo Study Conclusions - Left ventricle: Abnormal septal motion The cavity size was   normal. Wall thickness was increased in a pattern of mild LVH.   Systolic function was normal. The estimated ejection fraction was   in the range of 60% to 65%. Wall motion was normal; there were no   regional wall motion abnormalities. - Mitral valve: There was mild regurgitation. Valve area by   continuity equation (using LVOT flow): 3.11 cm^2. - Left atrium: The atrium was moderately dilated. - Right atrium: ? calcified chiari network in RA. - Atrial septum: No defect or patent foramen ovale was identified.   Subjective: Patient doing well this afternoon. He denies any new complaints. His dizziness has improved. No acute events overnight. Patient and wife agreeing to CIR.  Discharge Exam: Vitals:   05/08/16 0500 05/08/16 0934  BP: (!) 173/74 (!) 153/65  Pulse: (!) 59 68  Resp: 20 20  Temp: 98.1 F (36.7 C) 98.3 F (36.8 C)   Vitals:   05/07/16 2145 05/08/16 0130 05/08/16 0500 05/08/16 0934  BP: (!) 163/67 (!) 165/69 (!) 173/74 (!) 153/65  Pulse: 61 60 (!) 59 68  Resp: 20 18 20 20   Temp: 97.7 F (36.5 C) 98 F (36.7 C) 98.1 F (36.7 C) 98.3 F (36.8 C)  TempSrc: Axillary Oral Oral Oral  SpO2: 97% 95% 96% 94%  Weight:      Height:        General exam: Appears calm and comfortable  Respiratory system: Clear to auscultation. Respiratory effort normal. Cardiovascular system: S1 & S2 heard, irreg irreg. No JVD, murmurs, rubs, gallops or clicks. No pedal edema. Gastrointestinal system: Abdomen is nondistended, soft and nontender. No organomegaly or masses felt. Normal bowel sounds heard. Central nervous system: Alert and oriented. No focal neurological deficits. No facial droop, no aphasia. Finger-nose is improved since yesterday.  Extremities: Symmetric 5 x 5 power.  Skin: No rashes, lesions or ulcers Psychiatry: Judgement and insight  appear normal. Mood & affect appropriate.     The results of significant diagnostics from this hospitalization (including imaging, microbiology, ancillary and laboratory) are listed below for reference.     Microbiology: No results found for this or any previous visit (from the past 240 hour(s)).   Labs: BNP (last 3 results) No results for input(s): BNP in the last 8760 hours. Basic Metabolic Panel:  Recent Labs Lab 05/05/16 1035 05/08/16 0657  NA 140 140  K 3.8 3.6  CL 103 104  CO2 26 28  GLUCOSE 188* 131*  BUN 13 19  CREATININE 1.04 1.21  CALCIUM 8.9 8.9   Liver Function Tests:  Recent Labs Lab 05/05/16 1035  AST 32  ALT 22  ALKPHOS 77  BILITOT 0.6  PROT 7.3  ALBUMIN 3.5   No results for input(s): LIPASE, AMYLASE in the last 168 hours. No results for input(s): AMMONIA in the last 168 hours. CBC:  Recent Labs Lab 05/05/16 1035 05/08/16 0657  WBC 5.2 4.4  NEUTROABS 3.9 2.8  HGB 10.0* 9.5*  HCT 33.2* 30.8*  MCV 97.9 96.9  PLT 145* 128*   Cardiac Enzymes:  Recent Labs Lab 05/05/16 1634  TROPONINI <0.03   BNP: Invalid input(s): POCBNP CBG:  Recent Labs Lab 05/07/16 1142 05/07/16 1607 05/07/16 2152 05/08/16 0647 05/08/16 1123  GLUCAP 105* 141* 119* 135* 132*   D-Dimer No results for input(s): DDIMER in the last 72 hours. Hgb A1c  Recent Labs  05/05/16 1634 05/06/16 0531  HGBA1C 5.8* 5.7*   Lipid Profile  Recent Labs  05/06/16 0531  CHOL 136  HDL 38*  LDLCALC 84  TRIG 70  CHOLHDL 3.6   Thyroid function studies No results for input(s): TSH, T4TOTAL, T3FREE, THYROIDAB in the last 72 hours.  Invalid input(s): FREET3 Anemia work up No results for input(s): VITAMINB12, FOLATE, FERRITIN, TIBC, IRON, RETICCTPCT in the last 72 hours. Urinalysis    Component Value Date/Time   COLORURINE YELLOW 05/05/2016 1118   APPEARANCEUR CLEAR 05/05/2016 1118   LABSPEC 1.025 05/05/2016 1118   PHURINE 6.0 05/05/2016 1118   GLUCOSEU 250  (A) 05/05/2016 1118   HGBUR MODERATE (A) 05/05/2016 1118   BILIRUBINUR NEGATIVE 05/05/2016 1118   KETONESUR NEGATIVE 05/05/2016 1118   PROTEINUR 100 (A) 05/05/2016 1118   UROBILINOGEN 0.2 03/30/2014 1400   NITRITE NEGATIVE 05/05/2016 1118   LEUKOCYTESUR NEGATIVE 05/05/2016 1118   Sepsis Labs Invalid input(s): PROCALCITONIN,  WBC,  LACTICIDVEN Microbiology No results found for this or any previous visit (from the past 240 hour(s)).   Time coordinating discharge: Over 30 minutes  SIGNED:  Dessa Phi, DO [facility] Pager [phone]  If 7PM-7AM, please contact night-coverage [email or web address] Password [email or web address] 05/08/2016, 3:00 PM

## 2016-05-08 NOTE — Progress Notes (Signed)
Inpatient Rehabilitation  I have received acute MD clearance to admit patient to IP Rehab today and will proceed.  Please call with questions.   Carmelia Roller., CCC/SLP Admission Coordinator  [place]  Cell [phone]

## 2016-05-08 NOTE — Progress Notes (Signed)
Gunnar Fusi [facility] Admission Coordinator Signed Physical Medicine and Rehabilitation  PMR Pre-admission Date of Service: 05/08/2016 2:09 PM  Related encounter: ED to Hosp-Admission (Current) from 05/05/2016 in [place]       [] Hide copied text PMR Admission Coordinator Pre-Admission Assessment  Patient: Richard [name] is an 82 y.o., male MRN: [id number] DOB: [birth year] Height: 6\' 3"  (190.5 cm) Weight: 113.2 kg (249 lb 9 oz)                                                                                                                                                  Insurance Information HMO:     PPO:      PCP:      IPA:      80/20:      OTHER:  PRIMARY: Medicare A & B      Policy#: [id number]      Subscriber: Self CM Name:       Phone#:      Fax#:  Pre-Cert#:       Employer: Retired Benefits:  Phone #:      Name:  Eff. Date: 08/25/97     Deduct: $1,316      Out of Pocket Max: None      Life Max: N/A CIR: 100%      SNF: 100% Outpatient: 80%     Co-Pay: 20% Home Health: 100%      Co-Pay: none DME: 80%     Co-Pay: 20% Providers: patient's choice  SECONDARY: [employer]      Policy#: [id number]      Subscriber: Self CM Name:       Phone#:     Fax#:  Pre-Cert#:       Employer: Retired  Benefits:  Phone #: [phone]     Name:  Eff. Date:      Deduct:       Out of Pocket Max:       Life Max:  CIR:       SNF:  Outpatient:      Co-Pay:  Home Health:       Co-Pay:  DME:      Co-Pay:   Medicaid Application Date:       Case Manager:  Disability Application Date:       Case Worker:   Emergency Contact Information        Contact Information    Name Relation Home Work Mobile   [place] Spouse [phone]  [phone]     Current Medical History  Patient Admitting Diagnosis: Small cerebellar stroke  History of Present Illness: Richard Chance Robertsis a 82 y.o.malewith history of A fib-on Xarelto, B 12 deficiency, T2DM, HTN, OSA,  CHB s/p PPM, cognitive impairements who was admitted on 05/05/16 with complaints of weakness, vertigo and double vision. CT  head negative for acute changes. CTA head/neck done revealing no occlusions, dissection or significant stenosis. Patient with nystagmus and RUE/RLE dysmetria felt to be due to small cerebellar stroke. Neurology recommended adding low dose ASA to posterior circulation infarct likely due to SVD and continuing Xarelto. Speech therapy evaluation revealed baseline dysarthria and severe cognitive deficits. Therapy ongoing and patient with balance deficits, dizziness and nystagmus with head turn. CIR recommended for follow up therapy.   NIH Total: 1    Past Medical History  Past Medical History:  Diagnosis Date  . Anxiety   . Arthritis   . Atrial fibrillation ([place])    a. Dx 03/2013, notes report atrial fibrillation/atrial flutter, placed on amiodarone. NSR in subsequent OV's.  . B12 deficiency anemia   . Chest pain    a. H/o CTA negative for PE 2012, normal cath 2005, normal nucs previously including 05/2012.  Marland Kitchen Complete heart block (HCC)    a. s/p Medtronic Adapta L model ADDRL 1 (serial number [id number]) pacemaker.  Marland Kitchen GERD (gastroesophageal reflux disease)   . Hiatal hernia   . Hyperlipidemia   . Hypertension   . Iron deficiency anemia   . LBBB (left bundle branch block)   . Microcytic anemia   . Nephrolithiasis   . On home oxygen therapy    a. 2L w/CPAP at night  . Orthostatic hypotension   . OSA on CPAP    cpap  . [place] spotted fever ~ 1945  . Sinus drainage   . Skin cancer of lip   . Type II diabetes mellitus (HCC)     Family History  family history includes Breast cancer in his sister; Pneumonia in his father; Stroke in his brother; Thyroid disease in his mother.  Prior Rehab/Hospitalizations:  Has the patient had major surgery during 100 days prior to admission? No  Current Medications   Current  Facility-Administered Medications:  .   stroke: mapping our early stages of recovery book, , Does not apply, Once, Rondel Jumbo, PA-C .  acetaminophen (TYLENOL) tablet 650 mg, 650 mg, Oral, Q4H PRN, 650 mg at 05/08/16 0431 **OR** acetaminophen (TYLENOL) suppository 650 mg, 650 mg, Rectal, Q4H PRN, Rondel Jumbo, PA-C .  amiodarone (PACERONE) tablet 200 mg, 200 mg, Oral, Once per day on Mon Wed Fri, Sara E Wertman, PA-C, 200 mg at 05/07/16 [id number] .  aspirin EC tablet 81 mg, 81 mg, Oral, Daily, Rosalin Hawking, MD, 81 mg at 05/08/16 0818 .  atorvastatin (LIPITOR) tablet 40 mg, 40 mg, Oral, q1800, Patrecia Pour, MD, 40 mg at 05/07/16 1717 .  bisacodyl (DULCOLAX) suppository 10 mg, 10 mg, Rectal, Daily PRN, Rondel Jumbo, PA-C .  cyanocobalamin ((VITAMIN B-12)) injection 1,000 mcg, 1,000 mcg, Intramuscular, Once, [employer], DO .  feeding supplement (ENSURE ENLIVE) (ENSURE ENLIVE) liquid 237 mL, 237 mL, Oral, TID BM, Patrecia Pour, MD, 237 mL at 05/08/16 0819 .  insulin aspart (novoLOG) injection 0-9 Units, 0-9 Units, Subcutaneous, TID WC, Rondel Jumbo, PA-C, 1 Units at 05/08/16 1201 .  magnesium citrate solution 1 Bottle, 1 Bottle, Oral, Once PRN, Rondel Jumbo, PA-C .  metoprolol (LOPRESSOR) tablet 50 mg, 50 mg, Oral, BID, Rondel Jumbo, PA-C, 50 mg at 05/08/16 0819 .  ondansetron (ZOFRAN) tablet 4 mg, 4 mg, Oral, Q6H PRN **OR** ondansetron (ZOFRAN) injection 4 mg, 4 mg, Intravenous, Q6H PRN, Rondel Jumbo, PA-C, 4 mg at 05/06/16 0749 .  pantoprazole (PROTONIX) EC tablet 40 mg, 40  mg, Oral, Daily, Rondel Jumbo, PA-C, 40 mg at 05/08/16 0818 .  rivaroxaban (XARELTO) tablet 20 mg, 20 mg, Oral, Q supper, Rondel Jumbo, PA-C, 20 mg at 05/07/16 1717 .  senna-docusate (Senokot-S) tablet 1 tablet, 1 tablet, Oral, QHS PRN, Rondel Jumbo, PA-C .  sertraline (ZOLOFT) tablet 100 mg, 100 mg, Oral, Daily, Rondel Jumbo, PA-C, 100 mg at 05/08/16 0819 .  traZODone (DESYREL) tablet 25 mg, 25 mg,  Oral, QHS PRN, Rondel Jumbo, PA-C  Patients Current Diet: Diet heart healthy/carb modified Room service appropriate? Yes; Fluid consistency: Thin  Precautions / Restrictions Precautions Precautions: Fall Restrictions Weight Bearing Restrictions: No   Has the patient had 2 or more falls or a fall with injury in the past year?Yes, multiple falls.  Wife states 4-6 with minor injury such as bruising and skin tears.  One fall 2 months ago resulted in an ED visit with a negative head CT.    Prior Activity Level Community (5-7x/wk): Prior to admission patient was independent with all ALDs except spouse assisted with medication management.  Patient worked in the yard, drove around town, and frequented the farmers market.     Home Assistive Devices / Equipment Home Assistive Devices/Equipment: Cane (specify quad or straight), CBG Meter, CPAP Home Equipment: Shower seat, Grab bars - tub/shower, Cane - quad  Prior Device Use: Indicate devices/aids used by the patient prior to current illness, exacerbation or injury? cane as needed for longer distances .  Prior Functional Level Prior Function Level of Independence: Independent Comments: Walks short distances (~30-40 feet). Uses cane for distance if needed. Drives. Hx of falls, increased falls recently.  Self Care: Did the patient need help bathing, dressing, using the toilet or eating?  Independent  Indoor Mobility: Did the patient need assistance with walking from room to room (with or without device)? Independent  Stairs: Did the patient need assistance with internal or external stairs (with or without device)? Independent  Functional Cognition: Did the patient need help planning regular tasks such as shopping or remembering to take medications? Needed some help  Current Functional Level Cognition Arousal/Alertness: Awake/alert Overall Cognitive Status: History of cognitive impairments - at baseline Orientation Level:  Oriented X4 Attention: Sustained Sustained Attention: Appears intact Memory: Impaired Memory Impairment: Retrieval deficit (recalled 1/5 words independently, 1/5 with category cue, 3/5 with multiple choice cue) Awareness: Appears intact (aware of premorbid memory and new vision deficits) Problem Solving: Appears intact (looking for call bell in his bed when could not locate) Safety/Judgment: Appears intact (need to call for assistance) Comments: pt acknowledges wife checks his medications to assure he is taking properly -     Extremity Assessment (includes Sensation/Coordination) Upper Extremity Assessment: Defer to OT evaluation  Lower Extremity Assessment: Overall WFL for tasks assessed   ADLs Overall ADL's : Needs assistance/impaired Eating/Feeding: Set up, Bed level Grooming: Standing, Wash/dry face (Shaving) Grooming Details (indicate cue type and reason): Min guard to mod assist provided at times for LOB. Pt with LOB x2 when vision occluded; fall to L side and required physical assist to correct. Upper Body Bathing: Minimal assitance, Sitting Lower Body Bathing: Moderate assistance, +2 for physical assistance, Sit to/from stand Upper Body Dressing : Minimal assistance, Sitting Upper Body Dressing Details (indicate cue type and reason): to don hospital gown Lower Body Dressing: Min guard (sitting only to adjust socks) Toilet Transfer: Moderate assistance, +2 for safety/equipment, Ambulation, BSC, RW Toileting- Clothing Manipulation and Hygiene: Minimal assistance, Sit to/from stand Toileting -  Clothing Manipulation Details (indicate cue type and reason): Pt able to perfrom pericare after BM with physical support for balance and use of RW to steady. Functional mobility during ADLs: Moderate assistance, Rolling walker General ADL Comments: No c/o of dizziness or visual disturbance today but very unsteady in standing. Pt with LOB x2 during grooming task with vision occluded; mod assist to  correct. Discussed CIR level therapies with pt and family; feel pt needs more therapy prior to return home as he is a very high fall risk at this time.   Mobility Overal bed mobility: Needs Assistance Bed Mobility: Supine to Sit Rolling: Min guard Sidelying to sit: Min assist Sit to supine: Min assist General bed mobility comments: Grabbing for rail, stability assist   Transfers Overall transfer level: Needs assistance Equipment used: 2 person hand held assist Transfers: Sit to/from Stand Sit to Stand: Mod assist General transfer comment: assist coming forward and stability   Ambulation / Gait / Stairs / Wheelchair Mobility Ambulation/Gait Ambulation/Gait assistance: Mod assist Ambulation Distance (Feet): 80 Feet Assistive device: Rolling walker (2 wheeled) Gait Pattern/deviations: Step-through pattern General Gait Details: ataxia and incoordination of trunk and LE's necessitate assisting pt even with the RW Gait velocity interpretation: at or above normal speed for age/gender   Posture / Balance Dynamic Sitting Balance Sitting balance - Comments: Poor trunk control Balance Overall balance assessment: Needs assistance Sitting-balance support: Feet supported, No upper extremity supported Sitting balance-Leahy Scale: Fair Sitting balance - Comments: Poor trunk control Standing balance support: No upper extremity supported, During functional activity Standing balance-Leahy Scale: Poor Standing balance comment: support needed especially if eyes closed. Washing his face after shaving task pt quickly lost his balance to the left, not able to control his slam into the wall.   Special needs/care consideration BiPAP/CPAP: Yes CPAP at home PTA CPM: No Continuous Drip IV: No Dialysis: No         Life Vest: No Oxygen: No Special Bed: No Trach Size: No Wound Vac (area): No       Skin: WDL                           Bowel mgmt: Continent with therapy 9/14 Bladder mgmt: Incontinent with  condom cath Diabetic mgmt: HgbA1c 5.7, managed with oral medications and diet PTA with occasional home blood sugar checks     Previous Home Environment Living Arrangements: Spouse/significant other  Lives With: Spouse Available Help at Discharge: Family Type of Home: House Home Layout: Two level, Able to live on main level with bedroom/bathroom (basement) Home Access: Stairs to enter [employer] of Steps: 2-3 Bathroom Shower/Tub: Multimedia programmer: Handicapped height [place]: No  Discharge Living Setting Plans for Discharge Living Setting: Patient's home Type of Home at Discharge: House Discharge Home Layout: One level with basement, which patient did not go down to PTA Discharge Home Access: Stairs to enter Entrance Stairs-Rails:  (rails on each side, but can only reach one at a time) Entrance Stairs-Number of Steps: 2-3 steps  Discharge Bathroom Shower/Tub: Walk-in shower Discharge Bathroom Toilet: Handicapped height Discharge Bathroom Accessibility: Yes How Accessible: Accessible via walker Does the patient have any problems obtaining your medications?: No  Social/Family/Support Systems Patient Roles: Spouse, Parent, Other (Comment) (grandparent ) Anticipated Caregiver: Spouse: Jeffre Windecker [phone] Ability/Limitations of Caregiver: Spouse available 24/7; however, she can only provide Supervision and very light Min level assist  Caregiver Availability: 24/7 Discharge Plan Discussed with  Primary Caregiver: Yes Is Caregiver In Agreement with Plan?: Yes Does Caregiver/Family have Issues with Lodging/Transportation while Pt is in Rehab?: No  Goals/Additional Needs Patient/Family Goal for Rehab: PT/OT Supervision-Min A; SLP Supervision  Expected length of stay: 16-19 days Cultural Considerations: [employer]  Dietary Needs: Carb Mod; Heart Healthy; Low salt Equipment Needs: TBD Special Service Needs: None Additional Information:  None Pt/Family Agrees to Admission and willing to participate: Yes Program Orientation Provided & Reviewed with Pt/Caregiver Including Roles  & Responsibilities: Yes Additional Information Needs: None Information Needs to be Provided By: N/A  Decrease burden of Care through IP rehab admission: No  Possible need for SNF placement upon discharge: Not anticipated   Patient Condition: This patient's medical and functional status has changed since the consult dated 05/07/16 in which the Rehabilitation Physician determined and documented that the patient was potentially appropriate for intensive rehabilitative care in an inpatient rehabilitation facility. Issues have been addressed and update has been discussed with Dr. Letta Pate  and patient now appropriate for inpatient rehabilitation. Will admit to inpatient rehab today.   Preadmission Screen Completed By:  Gunnar Fusi, 05/08/2016 2:09 PM ______________________________________________________________________   Discussed status with Dr. Letta Pate on 05/08/16 at 1505 and received telephone approval for admission today.  Admission Coordinator:  Gunnar Fusi, time 1505/Date 05/08/16       Cosigned by: [name], MD at 05/08/2016 3:25 PM  Revision History

## 2016-05-08 NOTE — Progress Notes (Signed)
Report called in to nurse. Pt transferring to 4W19 with all personal belongings. Wife at bedside.

## 2016-05-08 NOTE — Progress Notes (Signed)
Small amounts of blood noted at patient's penis last night when condom cath came off, patient with no complaints of pain. This AM small amount of blood seen in condom cath tubing, although not visual in bag; patient still with no complaints of pain/burning.  Day shift RN aware of symptoms.

## 2016-05-09 ENCOUNTER — Inpatient Hospital Stay (HOSPITAL_COMMUNITY): Payer: Medicare Other

## 2016-05-09 ENCOUNTER — Inpatient Hospital Stay (HOSPITAL_COMMUNITY): Payer: Medicare Other | Admitting: Speech Pathology

## 2016-05-09 ENCOUNTER — Inpatient Hospital Stay (HOSPITAL_COMMUNITY): Payer: Medicare Other | Admitting: Occupational Therapy

## 2016-05-09 ENCOUNTER — Other Ambulatory Visit (HOSPITAL_COMMUNITY): Payer: Medicare Other

## 2016-05-09 DIAGNOSIS — I63441 Cerebral infarction due to embolism of right cerebellar artery: Secondary | ICD-10-CM

## 2016-05-09 DIAGNOSIS — I69393 Ataxia following cerebral infarction: Secondary | ICD-10-CM

## 2016-05-09 LAB — COMPREHENSIVE METABOLIC PANEL
ALT: 29 U/L (ref 17–63)
AST: 41 U/L (ref 15–41)
Albumin: 3.6 g/dL (ref 3.5–5.0)
Alkaline Phosphatase: 94 U/L (ref 38–126)
Anion gap: 11 (ref 5–15)
BUN: 17 mg/dL (ref 6–20)
CALCIUM: 9.4 mg/dL (ref 8.9–10.3)
CHLORIDE: 99 mmol/L — AB (ref 101–111)
CO2: 29 mmol/L (ref 22–32)
CREATININE: 1.3 mg/dL — AB (ref 0.61–1.24)
GFR, EST AFRICAN AMERICAN: 57 mL/min — AB (ref >60)
GFR, EST NON AFRICAN AMERICAN: 49 mL/min — AB (ref >60)
Glucose, Bld: 208 mg/dL — ABNORMAL HIGH (ref 65–99)
Potassium: 3.4 mmol/L — ABNORMAL LOW (ref 3.5–5.1)
Sodium: 139 mmol/L (ref 135–145)
TOTAL PROTEIN: 7.7 g/dL (ref 6.5–8.1)
Total Bilirubin: 0.6 mg/dL (ref 0.3–1.2)

## 2016-05-09 LAB — CBC WITH DIFFERENTIAL/PLATELET
Basophils Absolute: 0.1 10*3/uL (ref 0.0–0.1)
Basophils Relative: 1 %
EOS PCT: 3 %
Eosinophils Absolute: 0.2 10*3/uL (ref 0.0–0.7)
HCT: 37.1 % — ABNORMAL LOW (ref 39.0–52.0)
Hemoglobin: 11.4 g/dL — ABNORMAL LOW (ref 13.0–17.0)
LYMPHS ABS: 1.1 10*3/uL (ref 0.7–4.0)
LYMPHS PCT: 18 %
MCH: 29.5 pg (ref 26.0–34.0)
MCHC: 30.7 g/dL (ref 30.0–36.0)
MCV: 96.1 fL (ref 78.0–100.0)
MONO ABS: 0.8 10*3/uL (ref 0.1–1.0)
Monocytes Relative: 12 %
Neutro Abs: 4.2 10*3/uL (ref 1.7–7.7)
Neutrophils Relative %: 66 %
PLATELETS: 207 10*3/uL (ref 150–400)
RBC: 3.86 MIL/uL — AB (ref 4.22–5.81)
RDW: 16.1 % — ABNORMAL HIGH (ref 11.5–15.5)
WBC: 6.3 10*3/uL (ref 4.0–10.5)

## 2016-05-09 LAB — GLUCOSE, CAPILLARY
GLUCOSE-CAPILLARY: 141 mg/dL — AB (ref 65–99)
Glucose-Capillary: 141 mg/dL — ABNORMAL HIGH (ref 65–99)
Glucose-Capillary: 121 mg/dL — ABNORMAL HIGH (ref 65–99)
Glucose-Capillary: 123 mg/dL — ABNORMAL HIGH (ref 65–99)

## 2016-05-09 MED ORDER — POTASSIUM CHLORIDE CRYS ER 20 MEQ PO TBCR
20.0000 meq | EXTENDED_RELEASE_TABLET | Freq: Every day | ORAL | Status: DC
  Administered 2016-05-09: 20 meq via ORAL
  Filled 2016-05-09: qty 1

## 2016-05-09 NOTE — Evaluation (Signed)
Physical Therapy Assessment and Plan  Patient Details  Name: Richard [name] MRN: [id number] Date of Birth: 13-May-1931  PT Diagnosis: Ataxia, Cognitive deficits, Difficulty walking, Muscle weakness and Pain in joint Rehab Potential: Good ELOS: 10-14 days   Today's Date: 05/09/2016 PT Individual Time: 1000-1100 PT Individual Time Calculation (min): 60 min     Problem List: Patient Active Problem List   Diagnosis Date Noted  . Acute right PCA stroke ([place]) 05/08/2016  . Ataxia due to recent stroke   . Gait disturbance, post-stroke   . Cerebellar stroke, acute ([place]) 05/07/2016  . Hyperlipidemia   . Chronic anticoagulation   . Cardiac pacemaker in situ   . Acute blood loss anemia   . Cognitive deficits   . Cognitive deficit due to recent stroke   . Cognitive deficit due to recent cerebrovascular accident (CVA)   . Generalized weakness 05/05/2016  . Dizziness   . Absolute anemia 02/08/2016  . Weakness 02/01/2016  . Gastric polyp 06/21/2015  . Anemia, iron deficiency   . GERD (gastroesophageal reflux disease)   . B12 deficiency anemia   . Bradycardia with less than 30 beats per minute 05/31/2014  . Complete heart block ([place]) 05/31/2014  . Syncope 05/31/2014  . Anemia 05/31/2014  . Atrial fibrillation ([place]) 04/08/2013  . Diabetes mellitus ([place]) 04/06/2013  . Hiatal hernia 04/06/2013  . Benign essential HTN 06/13/2011  . OSA (obstructive sleep apnea) 06/13/2011    Past Medical History:  Past Medical History:  Diagnosis Date  . Anxiety   . Arthritis   . Atrial fibrillation ([place])    a. Dx 03/2013, notes report atrial fibrillation/atrial flutter, placed on amiodarone. NSR in subsequent OV's.  . B12 deficiency anemia   . Chest pain    a. H/o CTA negative for PE 2012, normal cath 2005, normal nucs previously including 05/2012.  Marland Kitchen Complete heart block (HCC)    a. s/p Medtronic Adapta L model ADDRL 1 (serial number [id number]) pacemaker.  Marland Kitchen GERD (gastroesophageal reflux  disease)   . Hiatal hernia   . Hyperlipidemia   . Hypertension   . Iron deficiency anemia   . LBBB (left bundle branch block)   . Microcytic anemia   . Nephrolithiasis   . On home oxygen therapy    a. 2L w/CPAP at night  . Orthostatic hypotension   . OSA on CPAP    cpap  . [place] spotted fever ~ 1945  . Sinus drainage   . Skin cancer of lip   . Type II diabetes mellitus ([place])    Past Surgical History:  Past Surgical History:  Procedure Laterality Date  . CARDIAC CATHETERIZATION     by Dr. Acie Fredrickson, January 24, 2004, that shows minimal coronary artery irregularities and normal left ventricular function  . CATARACT EXTRACTION W/ INTRAOCULAR LENS  IMPLANT, BILATERAL Bilateral   . ESOPHAGOGASTRODUODENOSCOPY (EGD) WITH PROPOFOL N/A 06/21/2015   Procedure: ESOPHAGOGASTRODUODENOSCOPY (EGD) WITH PROPOFOL;  Surgeon: Gatha Mayer, MD;  Location: [facility];  Service: Endoscopy;  Laterality: N/A;  . INGUINAL HERNIA REPAIR Right   . LUMBAR DISC SURGERY  ~ 1993  . PERMANENT PACEMAKER INSERTION N/A 06/03/2014   MDT Adapta L implanted by Dr Rayann Heman for syncope and transient AV block  . SKIN CANCER EXCISION     "lower lip" (04/06/2013)  . TEMPORARY PACEMAKER INSERTION N/A 05/31/2014   Procedure: TEMPORARY WIRE;  Surgeon: Sinclair Grooms, MD;  [facility] C[facility] LAB;  Service: Cardiovascular;  Laterality: N/A;  .  VASECTOMY     Hx of     Assessment & Plan Clinical Impression: Patient is a 82 y.o. year old male with recent admission to the hospital with history of A fib-on xarelto, B 12 deficiency, T2DM, HTN, OSA, CHB s/p PPM, cognitive impairements who was admitted on 05/05/16 with complaints of weakness, vertigo and double vision. CT head negative for acute changes. CTA head/neck done revealing no occlusions, dissection or significant stenosis. Patient with nystagmus and RUE/RLE dysmetria felt to be due to small cerebellar stroke. Neurology recommended adding low dose ASA to posterior  circulation infarct likely due to SVD and continuing Xarelto. Speech therapy evaluation revealed baseline dysarthria and severe cognitive deficits. Therapy ongoing and patient with balance deficits , dizziness and nystagmus with head turn. CIR recommended for follow up therapy..  Patient transferred to CIR on 05/08/2016 .   Patient currently requires min to mod assist with mobility secondary to muscle weakness and muscle joint tightness, decreased cardiorespiratoy endurance, decreased attention, decreased awareness, decreased problem solving, decreased safety awareness and decreased memory and decreased sitting balance, decreased standing balance, decreased postural control and decreased balance strategies.  Prior to hospitalization, patient was supervision with mobility and lived with Spouse in a House home.  Home access is 2-3Stairs to enter.  Patient will benefit from skilled PT intervention to maximize safe functional mobility, minimize fall risk and decrease caregiver burden for planned discharge home with 24 hour supervision.  Anticipate patient will benefit from follow up HH at discharge.  PT - End of Session Activity Tolerance: Decreased this session Endurance Deficit: Yes Endurance Deficit Description: required seated rest breaks throughout session PT Assessment Rehab Potential (ACUTE/IP ONLY): Good PT Patient demonstrates impairments in the following area(s): Balance;Behavior;Endurance;Motor;Pain;Safety PT Transfers Functional Problem(s): Bed Mobility;Bed to Chair;Car;Furniture PT Locomotion Functional Problem(s): Ambulation;Stairs PT Plan PT Intensity: Minimum of 1-2 x/day ,45 to 90 minutes PT Frequency: 5 out of 7 days PT Duration Estimated Length of Stay: 10-14 days PT Treatment/Interventions: Ambulation/gait training;Balance/vestibular training;Cognitive remediation/compensation;Discharge planning;Disease management/prevention;Community reintegration;DME/adaptive equipment  instruction;Functional mobility training;Neuromuscular re-education;Pain management;Patient/family education;Psychosocial support;Stair training;Therapeutic Activities;Therapeutic Exercise;UE/LE Strength taining/ROM;UE/LE Coordination activities;Visual/perceptual remediation/compensation;Wheelchair propulsion/positioning PT Transfers Anticipated Outcome(s): supervision overall PT Locomotion Anticipated Outcome(s): supervision ambulatory level overall PT Recommendation Follow Up Recommendations: Home health PT;24 hour supervision/assistance Patient destination: Home Equipment Recommended: Rolling walker with 5" wheels  Skilled Therapeutic Intervention Evaluation completed (see details above and below) with education on PT POC and goals and individual treatment initiated with focus on transfers, gait without AD and introduced RW, stair negotiation, fall risk and safety with mobility, w/c propulsion, and overall endurance. Pt required cues throughout for safety due to impulsivity. Required +2 assist for gait without AD due to truncal and BLE ataxia with up to mod assist needed for balance recovery. With RW required min to mod assist for transfers and gait due to poor eccentric control and ataxic movements.    PT Evaluation Precautions/Restrictions Precautions Precautions: Fall Precaution Comments: pt has experienced double vision and dizziness; improving Restrictions Weight Bearing Restrictions: No Pain Reports generalized pain but no intervention needed. Home Living/Prior Functioning Home Living Living Arrangements: Spouse/significant other Available Help at Discharge: Family Type of Home: House Home Access: Stairs to enter Entrance Stairs-Number of Steps: 2-3 Entrance Stairs-Rails: Right;Left Home Layout: Two level;Able to live on main level with bedroom/bathroom Bathroom Shower/Tub: Walk-in shower Additional Comments: has shower chair  Lives With: Spouse Prior Function Level of  Independence: Independent with basic ADLs;Independent with transfers;Independent with gait;Requires assistive device for independence  Able   to Take Stairs?: Yes Driving: Yes (just locally and not often) Vocation: Retired Comments: Uses cane for distance if needed. Drives. Hx of falls, increased falls recently. Vision/Perception  Vision - Assessment Eye Alignment: Within Functional Limits Ocular Range of Motion: Within Functional Limits Alignment/Gaze Preference: Within Defined Limits Tracking/Visual Pursuits: Decreased smoothness of horizontal tracking Saccades: Additional head turns occurred during testing;Decreased speed of saccadic movement Convergence: Impaired (comment) (L eye did not converge fully) Diplopia Assessment:  (Pt stated he had diplopia post CVA, but has resolved)  Cognition Overall Cognitive Status: History of cognitive impairments - at baseline Arousal/Alertness: Awake/alert Orientation Level: Oriented X4 (generally) Attention: Selective Sustained Attention: Appears intact Selective Attention: Impaired Memory: Impaired Memory Impairment: Retrieval deficit;Decreased recall of new information Awareness: Impaired Awareness Impairment: Emergent impairment Problem Solving: Impaired Problem Solving Impairment: Verbal complex Executive Function:  (all likely limited by underlying impairment) Behaviors: Impulsive Safety/Judgment: Other (comment) Comments: Pt states he knows to call for Assist, but stood up by himself. Sensation Sensation Light Touch: Appears Intact Stereognosis: Appears Intact Hot/Cold: Appears Intact Proprioception: Appears Intact Coordination Gross Motor Movements are Fluid and Coordinated: Yes Fine Motor Movements are Fluid and Coordinated: Yes Coordination and Movement Description: can tie a double knot in shoelaces Finger Nose Finger Test: 9x on R and 8x on L in 10 seconds Motor  Motor Motor: Ataxia Motor - Skilled Clinical Observations:  incoordination of LEs      Trunk/Postural Assessment  Cervical Assessment Cervical Assessment: Within Functional Limits (forward head) Thoracic Assessment Thoracic Assessment:  (flexed posture) Lumbar Assessment Lumbar Assessment: Within Functional Limits Postural Control Postural Control: Deficits on evaluation  Balance Balance Balance Assessed: Yes Static Sitting Balance Static Sitting - Level of Assistance: 5: Stand by assistance Dynamic Sitting Balance Dynamic Sitting - Level of Assistance: 5: Stand by assistance Static Standing Balance Static Standing - Level of Assistance: 4: Min assist Dynamic Standing Balance Dynamic Standing - Level of Assistance: 4: Min assist;3: Mod assist (without UE support) Extremity Assessment  RUE Assessment RUE Assessment: Within Functional Limits LUE Assessment LUE Assessment: Within Functional Limits RLE Assessment RLE Assessment:  (grossly 4/5; ) LLE Assessment LLE Assessment:  (grossly 4/5)   See Function Navigator for Current Functional Status.   Refer to Care Plan for Long Term Goals  Recommendations for other services: Other: Monitor for vesibular eval - symptoms improving  Discharge Criteria: Patient will be discharged from PT if patient refuses treatment 3 consecutive times without medical reason, if treatment goals not met, if there is a change in medical status, if patient makes no progress towards goals or if patient is discharged from hospital.  The above assessment, treatment plan, treatment alternatives and goals were discussed and mutually agreed upon: by patient and by family  Juanna Cao, PT, DPT  05/09/2016, 12:23 PM

## 2016-05-09 NOTE — Progress Notes (Signed)
82 y.o.malewith history of A fib-on xarelto, B 12 deficiency, T2DM, HTN, OSA, CHB s/p PPM, cognitive impairements who was admitted on 05/05/16 with complaints of weakness, vertigo and double vision. CT head negative for acute changes. CTA head/neck done revealing no occlusions, dissection or significant stenosis. Patient with nystagmus and RUE/RLE dysmetria felt to be due to small cerebellar stroke. Neurology recommended adding low dose ASA to posterior circulation infarct likely due to SVD and continuing Xarelto. Speech therapy evaluation revealed baseline dysarthria and severe cognitive deficits  Subjective/Complaints:   Objective: Vital Signs: Blood pressure (!) 174/86, pulse 68, temperature 97.5 F (36.4 C), temperature source Oral, resp. rate 18, SpO2 95 %. Ct Head Wo Contrast  Result Date: 05/08/2016 CLINICAL DATA:  Headache.  Prior stroke EXAM: CT HEAD WITHOUT CONTRAST TECHNIQUE: Contiguous axial images were obtained from the base of the skull through the vertex without intravenous contrast. COMPARISON:  Head CT 05/05/2016 FINDINGS: Brain: No mass lesion, intraparenchymal hemorrhage or extra-axial collection. No evidence of acute cortical infarct. There is confluent periventricular hypoattenuation compatible with chronic microvascular ischemia. Vascular: There is calcific atherosclerosis of the intracranial internal carotid and vertebral arteries. Skull: Normal visualized skull base, calvarium and extracranial soft tissues. Sinuses/Orbits: There is an atelectatic left maxillary sinus with internal frothy secretions. The other paranasal sinuses are normal. No mastoid or middle ear effusion. Normal orbits. IMPRESSION: 1. No acute intracranial abnormality. 2. Chronic microvascular ischemia. Electronically Signed   By: Ulyses Jarred M.D.   On: 05/08/2016 05:32   Results for orders placed or performed during the hospital encounter of 05/08/16 (from the past 72 hour(s))  Glucose, capillary      Status: None   Collection Time: 05/08/16  9:34 PM  Result Value Ref Range   Glucose-Capillary 96 65 - 99 mg/dL  Glucose, capillary     Status: Abnormal   Collection Time: 05/09/16  6:36 AM  Result Value Ref Range   Glucose-Capillary 141 (H) 65 - 99 mg/dL     HEENT: normal Cardio: RRR Resp: CTA B/L GI: BS positive Extremity:  Pulses positive and No Edema Skin:   Intact and Bruise forearm Neuro: Alert/Oriented and Abnormal Motor 4+ BUE an BLE Musc/Skel:  Normal and Other no pain with UE or LE ROM Gen NAD   Assessment/Plan: 1. 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 Physiatrist is providing close team supervision and 24 hour management of active medical problems listed below. Physiatrist and rehab team continue to assess barriers to discharge/monitor patient progress toward functional and medical goals. FIM:                   Function - Comprehension Comprehension: Auditory Comprehension assistive device: Hearing aids Comprehension assist level: Understands basic 50 - 74% of the time/ requires cueing 25 - 49% of the time  Function - Expression Expression: Verbal Expression assist level: Expresses basic 50 - 74% of the time/requires cueing 25 - 49% of the time. Needs to repeat parts of sentences.     Function - Problem Solving Problem solving assist level: Solves basic 50 - 74% of the time/requires cueing 25 - 49% of the time  Function - Memory Memory assist level: Recognizes or recalls 75 - 89% of the time/requires cueing 10 - 24% of the time Patient normally able to recall (first 3 days only): Current season, Location of own room, That he or she is in a  hospital Medical Problem List and Plan: 1.  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  DVT Prophylaxis/Anticoagulation:  Pharmaceutical: Xarelto 3. Pain Management: N/A 4. Anxiety disorder/Mood: On Zoloft 100 mg daily as well as valium at bedtime (for years per wife). LCSW to follow for evaluation and support.  5. Neuropsych: This patient is capable of making decisions on his own behalf. 6. Skin/Wound Care: routine pressure relief measures.  7. Fluids/Electrolytes/Nutrition:   Maintain adequate nutrition and hydration status.  Monitor I/O. Check lytes in am.  8.T2DM: Was on metformin 1000 mg bid and actos 30 mg daily--Hgb A1c- 5.7.  Resume actos and monitor BS ac/hs for now. Continue SSI for elevated BS, well controlled CBG (last 3)   Recent Labs  05/08/16 1624 05/08/16 2134 05/09/16 0636  GLUCAP 164* 96 141*    9. A Fib: Monitor heart rate bid. Continue amiodarone--3 X weekly, metoprolol bid and Xarelto 10. Iron deficiency anemia/B 12 deficiency: Followed by Hem/Onc--Treated with iron infusion on outpatient basis.  Received monthly B 12 supplement today 11. OSA:  Resume CPAP with 2 L oxygen at nights.  12. CHB:  PPM in place. H/o orthostatic symptoms.  13. HTN: Monitor BID--off Norvasc at this time.  Vitals:   05/08/16 2310 05/09/16 0358  BP:  (!) 174/86  Pulse: 74 68  Resp: 18 18  Temp:  97.5 F (36.4 C)    14. Chronic insomnia: Has been off Valium since admission.   LOS (Days) 1 A FACE TO FACE EVALUATION WAS PERFORMED  Ramya Vanbergen E 05/09/2016, 8:16 AM

## 2016-05-09 NOTE — Evaluation (Signed)
Speech Language Pathology Assessment and Plan  Patient Details  Name: Richard [name] MRN: [id number] Date of Birth: 1930/09/08  SLP Diagnosis: Cognitive Impairments  Rehab Potential:  (pt is at baseline function, no services indicated) ELOS:   n/a   Today's Date: 05/09/2016 SLP Individual Time: 1100-1200 SLP Individual Time Calculation (min): 60 min    Problem List: Patient Active Problem List   Diagnosis Date Noted  . Acute right PCA stroke ([place]) 05/08/2016  . Ataxia due to recent stroke   . Gait disturbance, post-stroke   . Cerebellar stroke, acute ([place]) 05/07/2016  . Hyperlipidemia   . Chronic anticoagulation   . Cardiac pacemaker in situ   . Acute blood loss anemia   . Cognitive deficits   . Cognitive deficit due to recent stroke   . Cognitive deficit due to recent cerebrovascular accident (CVA)   . Generalized weakness 05/05/2016  . Dizziness   . Absolute anemia 02/08/2016  . Weakness 02/01/2016  . Gastric polyp 06/21/2015  . Anemia, iron deficiency   . GERD (gastroesophageal reflux disease)   . B12 deficiency anemia   . Bradycardia with less than 30 beats per minute 05/31/2014  . Complete heart block ([place]) 05/31/2014  . Syncope 05/31/2014  . Anemia 05/31/2014  . Atrial fibrillation ([place]) 04/08/2013  . Diabetes mellitus ([place]) 04/06/2013  . Hiatal hernia 04/06/2013  . Benign essential HTN 06/13/2011  . OSA (obstructive sleep apnea) 06/13/2011   Past Medical History:  Past Medical History:  Diagnosis Date  . Anxiety   . Arthritis   . Atrial fibrillation ([place])    a. Dx 03/2013, notes report atrial fibrillation/atrial flutter, placed on amiodarone. NSR in subsequent OV's.  . B12 deficiency anemia   . Chest pain    a. H/o CTA negative for PE 2012, normal cath 2005, normal nucs previously including 05/2012.  Marland Kitchen Complete heart block (HCC)    a. s/p Medtronic Adapta L model ADDRL 1 (serial number [id number]) pacemaker.  Marland Kitchen GERD (gastroesophageal reflux disease)    . Hiatal hernia   . Hyperlipidemia   . Hypertension   . Iron deficiency anemia   . LBBB (left bundle branch block)   . Microcytic anemia   . Nephrolithiasis   . On home oxygen therapy    a. 2L w/CPAP at night  . Orthostatic hypotension   . OSA on CPAP    cpap  . [place] spotted fever ~ 1945  . Sinus drainage   . Skin cancer of lip   . Type II diabetes mellitus ([place])    Past Surgical History:  Past Surgical History:  Procedure Laterality Date  . CARDIAC CATHETERIZATION     by Dr. Acie Fredrickson, January 24, 2004, that shows minimal coronary artery irregularities and normal left ventricular function  . CATARACT EXTRACTION W/ INTRAOCULAR LENS  IMPLANT, BILATERAL Bilateral   . ESOPHAGOGASTRODUODENOSCOPY (EGD) WITH PROPOFOL N/A 06/21/2015   Procedure: ESOPHAGOGASTRODUODENOSCOPY (EGD) WITH PROPOFOL;  Surgeon: Gatha Mayer, MD;  Location: [facility] ENDOSCOPY;  Service: Endoscopy;  Laterality: N/A;  . INGUINAL HERNIA REPAIR Right   . LUMBAR DISC SURGERY  ~ 1993  . PERMANENT PACEMAKER INSERTION N/A 06/03/2014   MDT Adapta L implanted by Dr Rayann Heman for syncope and transient AV block  . SKIN CANCER EXCISION     "lower lip" (04/06/2013)  . TEMPORARY PACEMAKER INSERTION N/A 05/31/2014   Procedure: TEMPORARY WIRE;  Surgeon: Sinclair Grooms, MD;  Location: [facility] CATH LAB;  Service: Cardiovascular;  Laterality: N/A;  .  VASECTOMY     Hx of     Assessment / Plan / Recommendation Clinical Impression   82 y.o.malewith history of A fib-on xarelto, B 12 deficiency, T2DM, HTN, OSA, CHB s/p PPM, cognitive impairements who was admitted on 05/05/16 with complaints of weakness, vertigo and double vision. CT head negative for acute changes. CTA head/neck done revealing no occlusions, dissection or significant stenosis. Patient with nystagmus and RUE/RLE dysmetria felt to be due to small cerebellar stroke. Neurology recommended adding low dose ASA to posterior circulation infarct likely due to SVD and continuing  Xarelto.  Pt seen for cognitive-linguistic assessment which was consistent with the findings from acute care, pt demonstrates baseline dysarthria and cognitive deficits. Pt heavily relies upon his spouse for nearly all cognitive-linguistic tasks related to household management- finances, medication management, appointments, etc. Pt and his spouse deny any changes with this episode that have negatively impacted his cognitive-linguistic status. No further SLP services indicated.    Skilled Therapeutic Interventions          Pt declined participation with formal assessment via the [facility] Cognitive Assessment, indicating that he already completed it and he doesn't engage in any of this activity typically. Discussed strategies for improving intelligibility with pt and spouse- including hydration and removing his hand from in front of his mouth. Discussed importance of spouse performing all high risk cognitive tasks such as medication management- pt verbalized agreement.    SLP Assessment  Patient does not need any further Speech Lanaguage Pathology Services                            Pain Pain Assessment Pain Assessment: No/denies pain Pain Score: 0-No pain  Prior Functioning Cognitive/Linguistic Baseline: Baseline deficits Baseline deficit details: per wife and pt, baseline memory deficits and impaired speech Type of Home: House  Lives With: Spouse Available Help at Discharge: Family Vocation: Retired  Function:  Eating Eating                 Cognition Comprehension Comprehension assist level: Understands basic 75 - 89% of the time/ requires cueing 10 - 24% of the time  Expression   Expression assist level: Expresses basic 75 - 89% of the time/requires cueing 10 - 24% of the time. Needs helper to occlude trach/needs to repeat words.  Social Interaction Social Interaction assist level: Interacts appropriately 90% of the time - Needs monitoring or encouragement for  participation or interaction.  Problem Solving Problem solving assist level: Solves basic 50 - 74% of the time/requires cueing 25 - 49% of the time  Memory Memory assist level: Recognizes or recalls 50 - 74% of the time/requires cueing 25 - 49% of the time     Refer to Care Plan for Long Term Goals  Recommendations for other services: None  Discharge Criteria: Patient will be discharged from SLP if patient refuses treatment 3 consecutive times without medical reason, if treatment goals not met, if there is a change in medical status, if patient makes no progress towards goals or if patient is discharged from hospital.  The above assessment, treatment plan, treatment alternatives and goals were discussed and mutually agreed upon: by patient and by family  Vinetta Bergamo MA, [place] 05/09/2016, 12:16 PM

## 2016-05-09 NOTE — Progress Notes (Signed)
Social Work Assessment and Plan  Patient Details  Name: TYRONE PAUTSCH MRN: [id number] Date of Birth: [birth year]  Today's Date: 05/09/2016  Problem List:  Patient Active Problem List   Diagnosis Date Noted  . Acute right PCA stroke ([place]) 05/08/2016  . Ataxia due to recent stroke   . Gait disturbance, post-stroke   . Cerebellar stroke, acute ([place]) 05/07/2016  . Hyperlipidemia   . Chronic anticoagulation   . Cardiac pacemaker in situ   . Acute blood loss anemia   . Cognitive deficits   . Cognitive deficit due to recent stroke   . Cognitive deficit due to recent cerebrovascular accident (CVA)   . Generalized weakness 05/05/2016  . Dizziness   . Absolute anemia 02/08/2016  . Weakness 02/01/2016  . Gastric polyp 06/21/2015  . Anemia, iron deficiency   . GERD (gastroesophageal reflux disease)   . B12 deficiency anemia   . Bradycardia with less than 30 beats per minute 05/31/2014  . Complete heart block ([place]) 05/31/2014  . Syncope 05/31/2014  . Anemia 05/31/2014  . Atrial fibrillation ([place]) 04/08/2013  . Diabetes mellitus ([place]) 04/06/2013  . Hiatal hernia 04/06/2013  . Benign essential HTN 06/13/2011  . OSA (obstructive sleep apnea) 06/13/2011   Past Medical History:  Past Medical History:  Diagnosis Date  . Anxiety   . Arthritis   . Atrial fibrillation ([place])    a. Dx 03/2013, notes report atrial fibrillation/atrial flutter, placed on amiodarone. NSR in subsequent OV's.  . B12 deficiency anemia   . Chest pain    a. H/o CTA negative for PE 2012, normal cath 2005, normal nucs previously including 05/2012.  Marland Kitchen Complete heart block (HCC)    a. s/p Medtronic Adapta L model ADDRL 1 (serial number [id number]) pacemaker.  Marland Kitchen GERD (gastroesophageal reflux disease)   . Hiatal hernia   . Hyperlipidemia   . Hypertension   . Iron deficiency anemia   . LBBB (left bundle branch block)   . Microcytic anemia   . Nephrolithiasis   . On home oxygen therapy    a. 2L w/CPAP at night   . Orthostatic hypotension   . OSA on CPAP    cpap  . [place] spotted fever ~ 1945  . Sinus drainage   . Skin cancer of lip   . Type II diabetes mellitus ([place])    Past Surgical History:  Past Surgical History:  Procedure Laterality Date  . CARDIAC CATHETERIZATION     by Dr. Acie Fredrickson, January 24, 2004, that shows minimal coronary artery irregularities and normal left ventricular function  . CATARACT EXTRACTION W/ INTRAOCULAR LENS  IMPLANT, BILATERAL Bilateral   . ESOPHAGOGASTRODUODENOSCOPY (EGD) WITH PROPOFOL N/A 06/21/2015   Procedure: ESOPHAGOGASTRODUODENOSCOPY (EGD) WITH PROPOFOL;  Surgeon: Gatha Mayer, MD;  Location: [facility] ENDOSCOPY;  Service: Endoscopy;  Laterality: N/A;  . INGUINAL HERNIA REPAIR Right   . LUMBAR DISC SURGERY  ~ 1993  . PERMANENT PACEMAKER INSERTION N/A 06/03/2014   MDT Adapta L implanted by Dr Rayann Heman for syncope and transient AV block  . SKIN CANCER EXCISION     "lower lip" (04/06/2013)  . TEMPORARY PACEMAKER INSERTION N/A 05/31/2014   Procedure: TEMPORARY WIRE;  Surgeon: Sinclair Grooms, MD;  Location: [facility] CATH LAB;  Service: Cardiovascular;  Laterality: N/A;  . VASECTOMY     Hx of    Social History:  reports that he has quit smoking. His smoking use included Cigarettes. He smoked 0.00 packs per day for 20.00 years. He  has never used smokeless tobacco. He reports that he does not drink alcohol or use drugs.  Family / Support Systems Marital Status: Married How Long?: 69 years Patient Roles: Spouse, Parent, Other (Comment) (grandfather; great grandfather; brother) Spouse/Significant Other: Darrell Hauk - wife - [phone] (h); [phone] (m) Children: 1 dtr in [place]; 1 son in [place] Other Supports: brother; extended family Anticipated Caregiver: Spouse: Ernest Popowski  Ability/Limitations of Caregiver: None Caregiver Availability: 24/7 Family Dynamics: close, supportive family  Social History Preferred language: English Religion:  B[employer] Read: Yes Write: Yes Employment Status: Retired Date Retired/Disabled/Unemployed: 1992 Age Retired: 89 Public relations account executive Issues: none reported Guardian/Conservator: N/A - Dr. Letta Pate has determined that pt is able to make his own decisions.   Abuse/Neglect Physical Abuse: Denies Verbal Abuse: Denies Sexual Abuse: Denies Exploitation of patient/patient's resources: Denies Self-Neglect: Denies  Emotional Status Pt's affect, behavior and adjustment status: Pt reports feeling well emotionally, but wife admits pt was anxious about coming to CIR as he was not sure he could do the work required.  Pt and wife admit to some frustration that pt has been struggling for the last year with the MD unable to figure out what was wrong, especially without being able to do an MRI due to pacemaker. Recent Psychosocial Issues: Pt has been falling recently.   Psychiatric History: Pt with a hx of depression/anxiety.  He takes medication regularly and reports it helps him.  Wife mentioned that they are trying to wean pt off, but did not go into more detail. Substance Abuse History: none reported  Patient / Family Perceptions, Expectations & Goals Pt/Family understanding of illness & functional limitations: Pt/wife seem to have a good understanding of pt's condition and limitations.  They do not have any questions at this time for the medical team. Premorbid pt/family roles/activities: Pt mows the lawn on his tractor and goes out to eat with his wife. Anticipated changes in roles/activities/participation: Pt hopes to resume his activities as he is able. Pt/family expectations/goals: Pt wants "to get outta here."  Glad he's on rehab, but plans to work hard to get home.  Community Resources Express Scripts: None Premorbid Home Care/DME Agencies: None Transportation available at discharge: wife Resource referrals recommended: Neuropsychology, Support group (specify) ([place]  [facility])  Discharge Planning Living Arrangements: Spouse/significant other Support Systems: Spouse/significant other, Children, Other relatives, Friends/neighbors Type of Residence: Private residence Insurance Resources: [employer], Multimedia programmer (specify) ([employer]) Museum/gallery curator Resources: Radio broadcast assistant Screen Referred: No Money Management: Spouse Does the patient have any problems obtaining your medications?: No Home Management: Pt's wife does all inside home management.  Pt's brother has offered to assist with lawnmowing. Patient/Family Preliminary Plans: Pt plans to return home with his wife to provide supervision and min assist. Barriers to Discharge: Steps Social Work Anticipated Follow Up Needs: HH/OP, Support Group [facility] Stroke Support [facility]) Expected length of stay: 16-19 days  Clinical Impression CSW met with pt and his wife to introduce self and role of pt, as well as to complete assessment.  Pt and wife are pleased pt could come to rehab to recover from his stroke.  Wife was managing pt's medications and the home in large part prior to pt's admission, so she feels comfortable with pt returning home and she can provide supervision with light assist.  Pt's family is not local except for a brother.  Dtr wants parents to move to [place] with her, but pt/wife are not yet  ready to do that.  Children are supportive, though.  Pt motivated to work hard and get home.  Parking pass given to pt's wife for closer, more accessible parking.  CSW will continue to follow and assist as needed.  Keaundre Thelin, Silvestre Mesi 05/09/2016, 1:49 PM

## 2016-05-09 NOTE — Progress Notes (Signed)
[facility] Individual Statement of Services  Patient Name:  Richard [name]  Date:  05/09/2016  Welcome to the [facility].  Our goal is to provide you with an individualized program based on your diagnosis and situation, designed to meet your specific needs.  With this comprehensive rehabilitation program, you will be expected to participate in at least 3 hours of rehabilitation therapies Monday-Friday, with modified therapy programming on the weekends.  Your rehabilitation program will include the following services:  Physical Therapy (PT), Occupational Therapy (OT), 24 hour per day rehabilitation nursing, Case Management (Social Worker), Rehabilitation Medicine, Nutrition Services and Pharmacy Services  Weekly team conferences will be held on Wednesdays to discuss your progress.  Your Social Worker will talk with you frequently to get your input and to update you on team discussions.  Team conferences with you and your family in attendance may also be held.  Expected length of stay: 9 to 14 days  Overall anticipated outcome: Supervision  Depending on your progress and recovery, your program may change. Your Social Worker will coordinate services and will keep you informed of any changes. Your Social Worker's name and contact numbers are listed  below.  The following services may also be recommended but are not provided by the [facility] will be made to provide these services after discharge if needed.  Arrangements include referral to agencies that provide these services.  Your insurance has been verified to be:  Medicare and [employer] [place] Your primary doctor is:  Dr. Prince Solian  Pertinent information will be shared with your doctor and your insurance company.  Social Worker:  Alfonse Alpers, LCSW  [phone] or [phone]  Information discussed with and copy given to patient by: Trey Sailors, 05/09/2016, 2:15 PM

## 2016-05-09 NOTE — Evaluation (Signed)
Occupational Therapy Assessment and Plan  Patient Details  Name: Richard [name] MRN: [id number] Date of Birth: 10/20/1930  OT Diagnosis: ataxia, cognitive deficits and disturbance of vision Rehab Potential: Rehab Potential (ACUTE ONLY): Excellent ELOS: 9-10 days   Today's Date: 05/09/2016 OT Individual Time: 4742-5956 OT Individual Time Calculation (min): 60 min      Problem List: Patient Active Problem List   Diagnosis Date Noted  . Acute right PCA stroke ([place]) 05/08/2016  . Ataxia due to recent stroke   . Gait disturbance, post-stroke   . Cerebellar stroke, acute ([place]) 05/07/2016  . Hyperlipidemia   . Chronic anticoagulation   . Cardiac pacemaker in situ   . Acute blood loss anemia   . Cognitive deficits   . Cognitive deficit due to recent stroke   . Cognitive deficit due to recent cerebrovascular accident (CVA)   . Generalized weakness 05/05/2016  . Dizziness   . Absolute anemia 02/08/2016  . Weakness 02/01/2016  . Gastric polyp 06/21/2015  . Anemia, iron deficiency   . GERD (gastroesophageal reflux disease)   . B12 deficiency anemia   . Bradycardia with less than 30 beats per minute 05/31/2014  . Complete heart block ([place]) 05/31/2014  . Syncope 05/31/2014  . Anemia 05/31/2014  . Atrial fibrillation ([place]) 04/08/2013  . Diabetes mellitus ([place]) 04/06/2013  . Hiatal hernia 04/06/2013  . Benign essential HTN 06/13/2011  . OSA (obstructive sleep apnea) 06/13/2011    Past Medical History:  Past Medical History:  Diagnosis Date  . Anxiety   . Arthritis   . Atrial fibrillation ([place])    a. Dx 03/2013, notes report atrial fibrillation/atrial flutter, placed on amiodarone. NSR in subsequent OV's.  . B12 deficiency anemia   . Chest pain    a. H/o CTA negative for PE 2012, normal cath 2005, normal nucs previously including 05/2012.  Marland Kitchen Complete heart block (HCC)    a. s/p Medtronic Adapta L model ADDRL 1 (serial number [id number]) pacemaker.  Marland Kitchen GERD (gastroesophageal  reflux disease)   . Hiatal hernia   . Hyperlipidemia   . Hypertension   . Iron deficiency anemia   . LBBB (left bundle branch block)   . Microcytic anemia   . Nephrolithiasis   . On home oxygen therapy    a. 2L w/CPAP at night  . Orthostatic hypotension   . OSA on CPAP    cpap  . Rocky Mountain spotted fever ~ 1945  . Sinus drainage   . Skin cancer of lip   . Type II diabetes mellitus ([place])    Past Surgical History:  Past Surgical History:  Procedure Laterality Date  . CARDIAC CATHETERIZATION     by Dr. Acie Fredrickson, January 24, 2004, that shows minimal coronary artery irregularities and normal left ventricular function  . CATARACT EXTRACTION W/ INTRAOCULAR LENS  IMPLANT, BILATERAL Bilateral   . ESOPHAGOGASTRODUODENOSCOPY (EGD) WITH PROPOFOL N/A 06/21/2015   Procedure: ESOPHAGOGASTRODUODENOSCOPY (EGD) WITH PROPOFOL;  Surgeon: Gatha Mayer, MD;  Location[facility] ENDOSCOPY;  Service: Endoscopy;  Laterality: N/A;  . INGUINAL HERNIA REPAIR Right   . LUMBAR DISC SURGERY  ~ 1993  . PERMANENT PACEMAKER INSERTION N/A 06/03/2014   MDT Adapta L implanted by Dr Rayann Heman for syncope and transient AV block  . SKIN CANCER EXCISION     "lower lip" (04/06/2013)  . TEMPORARY PACEMAKER INSERTION N/A 05/31/2014   Procedure: TEMPORARY WIRE;  Surgeon: Sinclair Grooms, MD;  Location: [facility] CATH LAB;  Service: Cardiovascular;  Laterality: N/A;  .  VASECTOMY     Hx of     Assessment & Plan Clinical Impression: Richard [name] is a 82 y.o. male with history of A fib-on Xarelto, B 12 deficiency, T2DM, HTN, OSA, CHB s/p PPM, cognitive impairements who was admitted on 05/05/16 with complaints of weakness, vertigo and double vision. CT head negative for acute changes. CTA head/neck done revealing no occlusions, dissection or significant stenosis. Patient with nystagmus and RUE/RLE dysmetria felt to be due to small cerebellar stroke. Neurology recommended adding low dose ASA to posterior circulation infarct likely due to  SVD and continuing Xarelto. Speech therapy evaluation revealed baseline dysarthria and severe cognitive deficits.  Therapy ongoing and patient with balance deficits, dizziness and nystagmus with head turn. CIR recommended for follow up therapy.  Patient transferred to CIR on 05/08/2016 .    Patient currently requires min with basic self-care skills transfers secondary to decreased cardiorespiratoy endurance, ataxia, decreased visual acuity, decreased memory and delayed processing and decreased standing balance and decreased postural control.  Prior to hospitalization, patient could complete self care independently, did yard work, and drove occasionally.    Patient will benefit from skilled intervention to increase independence with basic self-care skills prior to discharge home with care partner.  Anticipate patient will require 24 hour supervision and follow up home health.  OT - End of Session Activity Tolerance: Tolerates 30+ min activity without fatigue Endurance Deficit: Yes Endurance Deficit Description: required seated rest breaks throughout session OT Assessment Rehab Potential (ACUTE ONLY): Excellent OT Patient demonstrates impairments in the following area(s): Balance;Cognition;Endurance;Safety;Vision OT Basic ADL's Functional Problem(s):  (pt is at S with basic ADLs and he will not have goals set) OT Advanced ADL's Functional Problem(s): Light Housekeeping OT Transfers Functional Problem(s): Toilet;Tub/Shower OT Additional Impairment(s): None OT Plan OT Intensity: Minimum of 1-2 x/day, 45 to 90 minutes OT Frequency: 5 out of 7 days OT Duration/Estimated Length of Stay: 9-10 days OT Treatment/Interventions: Balance/vestibular training;Discharge planning;Functional mobility training;Neuromuscular re-education;Self Care/advanced ADL retraining;Patient/family education;Therapeutic Activities;Therapeutic Exercise;UE/LE Coordination activities OT Self Feeding Anticipated Outcome(s): I OT  Basic Self-Care Anticipated Outcome(s): Supervision OT Toileting Anticipated Outcome(s): supervision OT Bathroom Transfers Anticipated Outcome(s): supervision OT Recommendation Patient destination: Home Follow Up Recommendations: Home health OT Equipment Recommended: None recommended by OT   Skilled Therapeutic Intervention Pt seen for initial evaluation and ADL retraining. Pt's wife reports that he has had memory/processing delays before this recent stroke.  Pt reports that his main concern is with his balance.  Evaluation revealed some oculomotor deficits, and pt reports he has had blurry vision for a long time and alternates between 5 different eyeglasses.  Pt able to get up from flat bed with no rails with S.  Min A sit to stand and mod A to maintain balance without RW.  With Rw, pt ambulated to toilet then shower.  Overall, close S with self care and min A transfers. Discussed with pt and wife purpose of OT, goals, POC.  Focus of OT will be on balance to increase I with transfers and IADLs.  OT Evaluation Precautions/Restrictions  Precautions Precautions: Fall Precaution Comments: pt has experienced double vision and dizziness; improving Restrictions Weight Bearing Restrictions: No  Pain Pain Assessment Pain Assessment: No/denies pain Pain Score: 0-No pain Home Living/Prior Functioning Home Living Living Arrangements: Spouse/significant other Available Help at Discharge: Family Type of Home: House Home Access: Stairs to enter [employer] of Steps: 2-3 Entrance Stairs-Rails: Right, Left Home Layout: Two level, Able to live on main level with bedroom/bathroom  Bathroom Shower/Tub: Camera operator Comments: has shower chair  Lives With: Spouse Prior Function Level of Independence: Independent with basic ADLs, Independent with transfers, Independent with gait, Requires assistive device for independence  Able to Take Stairs?: Yes Driving: Yes (just locally  and not often) Vocation: Retired Comments: Walks short distances (~30-40 feet). Uses cane for distance if needed. Drives. Hx of falls, increased falls recently. ADL ADL ADL Comments: refer to functional navigator Vision/Perception  Vision- History Baseline Vision/History: Wears glasses Wears Glasses: Reading only Patient Visual Report: Blurring of vision Vision- Assessment Vision Assessment?: Yes Eye Alignment: Within Functional Limits Ocular Range of Motion: Within Functional Limits Alignment/Gaze Preference: Within Defined Limits Tracking/Visual Pursuits: Decreased smoothness of horizontal tracking Saccades: Additional head turns occurred during testing;Decreased speed of saccadic movement Convergence: Impaired (comment) (L eye did not converge fully) Visual Fields: No apparent deficits Diplopia Assessment:  (Pt stated he had diplopia post CVA, but has resolved)  Cognition Overall Cognitive Status: History of cognitive impairments - at baseline Arousal/Alertness: Awake/alert Orientation Level: Person;Place;Situation Person: Oriented Place: Oriented Situation: Oriented Year: 2017 Month: September Day of Week: Correct Memory: Impaired Memory Impairment: Retrieval deficit;Decreased recall of new information Immediate Memory Recall: Sock;Blue;Bed Memory Recall: Blue;Sock;Bed Memory Recall Sock: With Cue Memory Recall Blue: Without Cue Memory Recall Bed: With Cue (2 cues needed) Attention: Selective Sustained Attention: Appears intact Selective Attention: Impaired Awareness: Impaired Awareness Impairment: Emergent impairment Problem Solving: Impaired Problem Solving Impairment: Verbal complex Executive Function:  (all likely limited by underlying impairment) Behaviors: Impulsive Safety/Judgment: Other (comment) Comments: Pt states he knows to call for Assist, but stood up by himself. Sensation Sensation Light Touch: Appears Intact Stereognosis: Appears  Intact Hot/Cold: Appears Intact Proprioception: Appears Intact Coordination Gross Motor Movements are Fluid and Coordinated: Yes Fine Motor Movements are Fluid and Coordinated: Yes Coordination and Movement Description: can tie a double knot in shoelaces Finger Nose Finger Test: 9x on R and 8x on L in 10 seconds Motor  Motor Motor: Ataxia Motor - Skilled Clinical Observations: incoordination of LEs and trunk Mobility    refer to functional navigator Trunk/Postural Assessment  Cervical Assessment Cervical Assessment: Within Functional Limits (forward head) Thoracic Assessment Thoracic Assessment:  (flexed posture) Lumbar Assessment Lumbar Assessment: Within Functional Limits Postural Control Postural Control: Deficits on evaluation  Balance Balance Balance Assessed: Yes Static Sitting Balance Static Sitting - Level of Assistance: 5: Stand by assistance Dynamic Sitting Balance Dynamic Sitting - Level of Assistance: 5: Stand by assistance Static Standing Balance Static Standing - Level of Assistance: 4: Min assist Dynamic Standing Balance Dynamic Standing - Level of Assistance: 4: Min assist;3: Mod assist (without UE support) Extremity/Trunk Assessment RUE Assessment RUE Assessment: Within Functional Limits LUE Assessment LUE Assessment: Within Functional Limits   See Function Navigator for Current Functional Status.   Refer to Care Plan for Long Term Goals  Recommendations for other services: None  Discharge Criteria: Patient will be discharged from OT if patient refuses treatment 3 consecutive times without medical reason, if treatment goals not met, if there is a change in medical status, if patient makes no progress towards goals or if patient is discharged from hospital.  The above assessment, treatment plan, treatment alternatives and goals were discussed and mutually agreed upon: by patient and by family  Linday Rhodes 05/09/2016, 12:35 PM

## 2016-05-10 ENCOUNTER — Inpatient Hospital Stay (HOSPITAL_COMMUNITY): Payer: Medicare Other

## 2016-05-10 LAB — URINE MICROSCOPIC-ADD ON

## 2016-05-10 LAB — URINALYSIS, ROUTINE W REFLEX MICROSCOPIC
BILIRUBIN URINE: NEGATIVE
Glucose, UA: NEGATIVE mg/dL
Ketones, ur: NEGATIVE mg/dL
NITRITE: NEGATIVE
PH: 5 (ref 5.0–8.0)
Protein, ur: 100 mg/dL — AB
SPECIFIC GRAVITY, URINE: 1.012 (ref 1.005–1.030)

## 2016-05-10 LAB — GLUCOSE, CAPILLARY
GLUCOSE-CAPILLARY: 167 mg/dL — AB (ref 65–99)
Glucose-Capillary: 145 mg/dL — ABNORMAL HIGH (ref 65–99)
Glucose-Capillary: 142 mg/dL — ABNORMAL HIGH (ref 65–99)
Glucose-Capillary: 145 mg/dL — ABNORMAL HIGH (ref 65–99)

## 2016-05-10 MED ORDER — SENNOSIDES-DOCUSATE SODIUM 8.6-50 MG PO TABS
2.0000 | ORAL_TABLET | Freq: Two times a day (BID) | ORAL | Status: DC
  Administered 2016-05-10 – 2016-05-16 (×10): 2 via ORAL
  Filled 2016-05-10 (×15): qty 2

## 2016-05-10 MED ORDER — POTASSIUM CHLORIDE CRYS ER 10 MEQ PO TBCR
10.0000 meq | EXTENDED_RELEASE_TABLET | Freq: Every day | ORAL | Status: DC
  Administered 2016-05-10 – 2016-05-17 (×8): 10 meq via ORAL
  Filled 2016-05-10 (×8): qty 1

## 2016-05-10 NOTE — Progress Notes (Addendum)
82 y.o.malewith history of A fib-on xarelto, B 12 deficiency, T2DM, HTN, OSA, CHB s/p PPM, cognitive impairements who was admitted on 05/05/16 with complaints of weakness, vertigo and double vision. CT head negative for acute changes. CTA head/neck done revealing no occlusions, dissection or significant stenosis. Patient with nystagmus and RUE/RLE dysmetria felt to be due to small cerebellar stroke. Neurology recommended adding low dose ASA to posterior circulation infarct likely due to SVD and continuing Xarelto. Speech therapy evaluation revealed baseline dysarthria and severe cognitive deficits  Subjective/Complaints: Slept well, had mucus BM but no actual BM x 2d ROS- - CP or SOB, - N/V/D Objective: Vital Signs: Blood pressure (!) 166/77, pulse (!) 59, temperature (!) 96.3 F (35.7 C), temperature source Axillary, resp. rate 18, weight 110.6 kg (243 lb 13.3 oz), SpO2 97 %. No results found. Results for orders placed or performed during the hospital encounter of 05/08/16 (from the past 72 hour(s))  Glucose, capillary     Status: None   Collection Time: 05/08/16  9:34 PM  Result Value Ref Range   Glucose-Capillary 96 65 - 99 mg/dL  Glucose, capillary     Status: Abnormal   Collection Time: 05/09/16  6:36 AM  Result Value Ref Range   Glucose-Capillary 141 (H) 65 - 99 mg/dL  CBC WITH DIFFERENTIAL     Status: Abnormal   Collection Time: 05/09/16  9:28 AM  Result Value Ref Range   WBC 6.3 4.0 - 10.5 K/uL   RBC 3.86 (L) 4.22 - 5.81 MIL/uL   Hemoglobin 11.4 (L) 13.0 - 17.0 g/dL   HCT 37.1 (L) 39.0 - 52.0 %   MCV 96.1 78.0 - 100.0 fL   MCH 29.5 26.0 - 34.0 pg   MCHC 30.7 30.0 - 36.0 g/dL   RDW 16.1 (H) 11.5 - 15.5 %   Platelets 207 150 - 400 K/uL   Neutrophils Relative % 66 %   Neutro Abs 4.2 1.7 - 7.7 K/uL   Lymphocytes Relative 18 %   Lymphs Abs 1.1 0.7 - 4.0 K/uL   Monocytes Relative 12 %   Monocytes Absolute 0.8 0.1 - 1.0 K/uL   Eosinophils Relative 3 %   Eosinophils Absolute  0.2 0.0 - 0.7 K/uL   Basophils Relative 1 %   Basophils Absolute 0.1 0.0 - 0.1 K/uL  Comprehensive metabolic panel     Status: Abnormal   Collection Time: 05/09/16  9:28 AM  Result Value Ref Range   Sodium 139 135 - 145 mmol/L   Potassium 3.4 (L) 3.5 - 5.1 mmol/L   Chloride 99 (L) 101 - 111 mmol/L   CO2 29 22 - 32 mmol/L   Glucose, Bld 208 (H) 65 - 99 mg/dL   BUN 17 6 - 20 mg/dL   Creatinine, Ser 1.30 (H) 0.61 - 1.24 mg/dL   Calcium 9.4 8.9 - 10.3 mg/dL   Total Protein 7.7 6.5 - 8.1 g/dL   Albumin 3.6 3.5 - 5.0 g/dL   AST 41 15 - 41 U/L   ALT 29 17 - 63 U/L   Alkaline Phosphatase 94 38 - 126 U/L   Total Bilirubin 0.6 0.3 - 1.2 mg/dL   GFR calc non Af Amer 49 (L) >60 mL/min   GFR calc Af Amer 57 (L) >60 mL/min    Comment: (NOTE) The eGFR has been calculated using the CKD EPI equation. This calculation has not been validated in all clinical situations. eGFR's persistently <60 mL/min signify possible Chronic Kidney Disease.    Anion  gap 11 5 - 15  Glucose, capillary     Status: Abnormal   Collection Time: 05/09/16 12:08 PM  Result Value Ref Range   Glucose-Capillary 121 (H) 65 - 99 mg/dL  Glucose, capillary     Status: Abnormal   Collection Time: 05/09/16  4:42 PM  Result Value Ref Range   Glucose-Capillary 123 (H) 65 - 99 mg/dL  Glucose, capillary     Status: Abnormal   Collection Time: 05/09/16  9:02 PM  Result Value Ref Range   Glucose-Capillary 141 (H) 65 - 99 mg/dL  Glucose, capillary     Status: Abnormal   Collection Time: 05/10/16  7:03 AM  Result Value Ref Range   Glucose-Capillary 145 (H) 65 - 99 mg/dL     HEENT: normal Cardio: RRR Resp: CTA B/L GI: BS positive Extremity:  Pulses positive and No Edema Skin:   Intact and Bruise forearm Neuro: Alert/Oriented and Abnormal Motor 4+ BUE an BLE Musc/Skel:  Normal and Other no pain with UE or LE ROM Gen NAD   Assessment/Plan: 1. Functional deficits secondary to cerebellar infarct , RIght  which require 3+  hours per day of interdisciplinary therapy in a comprehensive inpatient rehab setting. Physiatrist is providing close team supervision and 24 hour management of active medical problems listed below. Physiatrist and rehab team continue to assess barriers to discharge/monitor patient progress toward functional and medical goals. FIM: Function - Bathing Position: Shower Body parts bathed by patient: Right arm, Left arm, Chest, Abdomen, Front perineal area, Buttocks, Right upper leg, Left upper leg, Right lower leg, Left lower leg Body parts bathed by helper: Back Assist Level: Supervision or verbal cues  Function- Upper Body Dressing/Undressing What is the patient wearing?: Pull over shirt/dress Pull over shirt/dress - Perfomed by patient: Thread/unthread right sleeve, Thread/unthread left sleeve, Put head through opening, Pull shirt over trunk Assist Level: Set up Function - Lower Body Dressing/Undressing What is the patient wearing?: Underwear, Pants, Socks, Shoes Position: Wheelchair/chair at sink Underwear - Performed by patient: Thread/unthread right underwear leg, Thread/unthread left underwear leg, Pull underwear up/down Pants- Performed by patient: Thread/unthread right pants leg, Thread/unthread left pants leg, Pull pants up/down, Fasten/unfasten pants Socks - Performed by patient: Don/doff right sock, Don/doff left sock Shoes - Performed by patient: Don/doff right shoe, Don/doff left shoe, Fasten right, Fasten left Assist for footwear: Supervision/touching assist Assist for lower body dressing: Supervision or verbal cues  Function - Toileting Toileting steps completed by patient: Adjust clothing prior to toileting, Performs perineal hygiene, Adjust clothing after toileting Assist level: Touching or steadying assistance (Pt.75%)  Function - Toilet Transfers Toilet transfer assistive device: Grab bar, Walker Assist level to toilet: Touching or steadying assistance (Pt >  75%) Assist level from toilet: Touching or steadying assistance (Pt > 75%)  Function - Chair/bed transfer Chair/bed transfer method: Stand pivot Chair/bed transfer assist level: Moderate assist (Pt 50 - 74%/lift or lower) Chair/bed transfer assistive device: Armrests  Function - Locomotion: Wheelchair Will patient use wheelchair at discharge?: No Max wheelchair distance: 120' Assist Level: Supervision or verbal cues Assist Level: Supervision or verbal cues Wheel 150 feet activity did not occur: Safety/medical concerns (endurance) Turns around,maneuvers to table,bed, and toilet,negotiates 3% grade,maneuvers on rugs and over doorsills: No Function - Locomotion: Ambulation Assistive device: No device Max distance: 60' Assist level: 2 helpers Assist level: 2 helpers Assist level: 2 helpers Walk 150 feet activity did not occur: Safety/medical concerns  Function - Comprehension Comprehension: Auditory Comprehension assistive device: Hearing aids   Comprehension assist level: Understands basic 75 - 89% of the time/ requires cueing 10 - 24% of the time  Function - Expression Expression: Verbal Expression assist level: Expresses basic 75 - 89% of the time/requires cueing 10 - 24% of the time. Needs helper to occlude trach/needs to repeat words.  Function - Social Interaction Social Interaction assist level: Interacts appropriately 90% of the time - Needs monitoring or encouragement for participation or interaction.  Function - Problem Solving Problem solving assist level: Solves basic 50 - 74% of the time/requires cueing 25 - 49% of the time  Function - Memory Memory assist level: Recognizes or recalls 50 - 74% of the time/requires cueing 25 - 49% of the time Patient normally able to recall (first 3 days only): Current season, Location of own room, That he or she is in a hospital Medical Problem List and Plan: 1.  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  DVT Prophylaxis/Anticoagulation: Pharmaceutical: Xarelto 3. Pain Management: N/A 4. Anxiety disorder/Mood: On Zoloft 100 mg daily as well as valium at bedtime (for years per wife). LCSW to follow for evaluation and support.  5. Neuropsych: This patient is capable of making decisions on his own behalf. 6. Skin/Wound Care: routine pressure relief measures.  7. Fluids/Electrolytes/Nutrition:   Maintain adequate nutrition and hydration status.  Monitor I/O. Check lytes in am.  8.T2DM: Was on metformin 1000 mg bid and actos 30 mg daily--Hgb A1c- 5.7.  Resume actos and monitor BS ac/hs for now. Continue SSI for elevated BS, well controlled CBG (last 3)   Recent Labs  05/09/16 1642 05/09/16 2102 05/10/16 0703  GLUCAP 123* 141* 145*    9. A Fib: Monitor heart rate bid. Continue amiodarone--3 X weekly, metoprolol bid and Xarelto 10. Iron deficiency anemia/B 12 deficiency: Followed by Hem/Onc--Treated with iron infusion on outpatient basis.  Received monthly B 12 supplement today 11. OSA:  Resume CPAP with 2 L oxygen at nights.  12. CHB:  PPM in place. H/o orthostatic symptoms.  13. HTN: Monitor BID--off Norvasc at this time.  Vitals:   05/09/16 2310 05/10/16 0600  BP:  (!) 166/77  Pulse: 67 (!) 59  Resp: 18   Temp:  (!) 96.3 F (35.7 C)    14. Chronic insomnia: Has been off Valium since admission.   15.  Mild hypoK will supplement 16.  Constipation- had mucus PR will give laxative 17.  Urinary incont- check UA LOS (Days) 2 A FACE TO FACE EVALUATION WAS PERFORMED  Hildagard Sobecki E 05/10/2016, 8:45 AM

## 2016-05-10 NOTE — Progress Notes (Signed)
Occupational Therapy Session Note  Patient Details  Name: Richard [name] MRN: [id number] Date of Birth: [birth year]  Today's Date: 05/10/2016 OT Individual Time: 1400-1430 OT Individual Time Calculation (min): 30 min   Short Term Goals: Week 1:  OT Short Term Goal 1 (Week 1): Pt will be able to stand and reach to the floor level to retrieve shoe with min A. OT Short Term Goal 2 (Week 1): Pt will be able to tolerate standing at the sink for 10 min to complete grooming to demonstrate improved activity tolerance. OT Short Term Goal 3 (Week 1): Pt will be able to step over shower stall ledge with min A.  Skilled Therapeutic Interventions/Progress Updates:   Therapeutic activity with focus on improved standing tolerance, visual scanning, and problem-solving.   Pt received seated in recliner with spouse present.   Pt able to ambulate from recliner to w/c with hand-held assist, steadying assist to maintain balance.   Pt was then escorted to TBI room and instructed on use of dynavision system to challenge standing balance, visual scanning and reaction testing.   Pt performed 3 sessions standing with contact guard.   Overall reaction during 1-minute sessions were 1.40 seconds for stimuli in all quadrants.   Pt was then presented with standing activity and instructed on game play of [employer].   Pt required repeated instructions but participated enthusiastically using effective strategy for game play 75% of the time with subtle cues.  Therapy Documentation Precautions:  Precautions Precautions: Fall Precaution Comments: pt has experienced double vision and dizziness; improving Restrictions Weight Bearing Restrictions: No  Vital Signs: Therapy Vitals Temp: 97.9 F (36.6 C) Temp Source: Oral Pulse Rate: 60 Resp: 18 BP: 134/62 Patient Position (if appropriate): Sitting   Pain: No/denies pain   ADL: ADL ADL Comments: refer to functional navigator  See Function Navigator for Current Functional  Status.   Therapy/Group: Individual Therapy  [place] 05/10/2016, 3:52 PM

## 2016-05-10 NOTE — Interval H&P Note (Signed)
Richard [name] was admitted today to Inpatient Rehabilitation with the diagnosis of CVA.  The patient's history has been reviewed, patient examined, and there is no change in status.  Patient continues to be appropriate for intensive inpatient rehabilitation.  I have reviewed the patient's chart and labs.  Questions were answered to the patient's satisfaction. The PAPE has been reviewed and assessment remains appropriate.  [name] 05/10/2016, 12:55 PM

## 2016-05-10 NOTE — IPOC Note (Signed)
Overall Plan of Care [facility]) Patient Details Name: Richard [name] MRN: [id number] DOB: 06-05-1931  Admitting Diagnosis: CVA  Hospital Problems: Principal Problem:   Ataxia due to recent stroke Active Problems:   Cognitive deficit due to recent cerebrovascular accident (CVA)   Gait disturbance, post-stroke     Functional Problem List: Nursing Bladder, Bowel, Medication Management, Pain, Safety, Skin Integrity  PT Balance, Behavior, Endurance, Motor, Pain, Safety  OT Balance, Cognition, Endurance, Safety, Vision  SLP Cognition (baseline)  TR         Basic ADL's: OT  (pt is at S with basic ADLs and he will not have goals set)     Advanced  ADL's: OT Light Housekeeping     Transfers: PT Bed Mobility, Bed to Chair, Car, Manufacturing systems engineer, Metallurgist: PT Ambulation, Stairs     Additional Impairments: OT None  SLP Social Cognition, Communication   Problem Solving, Memory, Attention, Awareness  TR      Anticipated Outcomes Item Anticipated Outcome  Self Feeding I  Swallowing      Basic self-care  Supervision  Toileting  supervision   Bathroom Transfers supervision  Bowel/Bladder  Continent of bowel and bladder LBM 9/14  Transfers  supervision overall  Locomotion  supervision ambulatory level overall  Communication     Cognition     Pain  <3  Safety/Judgment  Supervision   Therapy Plan: PT Intensity: Minimum of 1-2 x/day ,45 to 90 minutes PT Frequency: 5 out of 7 days PT Duration Estimated Length of Stay: 10-14 days OT Intensity: Minimum of 1-2 x/day, 45 to 90 minutes OT Frequency: 5 out of 7 days OT Duration/Estimated Length of Stay: 9-10 days         Team Interventions: Nursing Interventions Bladder Management, Bowel Management, Disease Management/Prevention, Pain Management, Medication Management, Skin Care/Wound Management  PT interventions Ambulation/gait training, Balance/vestibular training, Cognitive  remediation/compensation, Discharge planning, Disease management/prevention, Community reintegration, DME/adaptive equipment instruction, Functional mobility training, Neuromuscular re-education, Pain management, Patient/family education, Psychosocial support, Stair training, Therapeutic Activities, Therapeutic Exercise, UE/LE Strength taining/ROM, UE/LE Coordination activities, Visual/perceptual remediation/compensation, Wheelchair propulsion/positioning  OT Interventions Training and development officer, Discharge planning, Functional mobility training, Neuromuscular re-education, Self Care/advanced ADL retraining, Patient/family education, Therapeutic Activities, Therapeutic Exercise, UE/LE Coordination activities  SLP Interventions    TR Interventions    SW/CM Interventions Discharge Planning, Psychosocial Support, Patient/Family Education    Team Discharge Planning: Destination: PT-Home ,OT- Home , SLP-  Projected Follow-up: PT-Home health PT, 24 hour supervision/assistance, OT-  Home health OT, SLP-  Projected Equipment Needs: PT-Rolling walker with 5" wheels, OT- None recommended by OT, SLP-  Equipment Details: PT- , OT-  Patient/family involved in discharge planning: PT- Patient, Family member/caregiver,  OT-Patient, Family member/caregiver, SLP-Patient, Family member/caregiver  MD ELOS: 14-17d Medical Rehab Prognosis:  Good Assessment: 82 y.o.malewith history of A fib-on xarelto, B 12 deficiency, T2DM, HTN, OSA, CHB s/p PPM, cognitive impairements who was admitted on 05/05/16 with complaints of weakness, vertigo and double vision. CT head negative for acute changes. CTA head/neck done revealing no occlusions, dissection or significant stenosis. Patient with nystagmus and RUE/RLE dysmetria felt to be due to small cerebellar stroke. Neurology recommended adding low dose ASA to posterior circulation infarct likely due to SVD and continuing Xarelto.    Now requiring 24/7 Rehab RN,MD, as well  as CIR level PT, OT and SLP.  Treatment team will focus on ADLs and mobility with goals set at Sup See Team Conference Notes for  weekly updates to the plan of care

## 2016-05-10 NOTE — H&P (View-Only) (Signed)
Physical Medicine and Rehabilitation Admission H&P       Chief Complaint  Patient presents with  . Dizziness, occasional double vision and balance deficits    HPI: Richard Stidham Robertsis a 82 y.o.malewith history of A fib-on xarelto, B 12 deficiency, T2DM, HTN, OSA, CHB s/p PPM, cognitive impairements who was admitted on 05/05/16 with complaints of weakness, vertigo and double vision. CT head negative for acute changes. CTA head/neck done revealing no occlusions, dissection or significant stenosis. Patient with nystagmus and RUE/RLE dysmetria felt to be due to small cerebellar stroke. Neurology recommended adding low dose ASA to posterior circulation infarct likely due to SVD and continuing Xarelto. Speech therapy evaluation revealed baseline dysarthria and severe cognitive deficits. Therapy ongoing and patient with balance deficits , dizziness and nystagmus with head turn. CIR recommended for follow up therapy.    Review of Systems  HENT: Negative for hearing loss and tinnitus.   Eyes: Positive for blurred vision. Negative for double vision.  Respiratory: Negative for cough, shortness of breath and stridor.   Cardiovascular: Negative for chest pain and palpitations.  Gastrointestinal: Negative for abdominal pain and heartburn.  Genitourinary: Positive for urgency. Negative for dysuria and frequency.  Musculoskeletal: Positive for falls (for the past year--negative work up).       Decline in gait since Dec--legs give away  Neurological: Positive for dizziness. Negative for weakness and headaches.  Psychiatric/Behavioral: The patient is nervous/anxious and has insomnia (didn't sleep last night).           Past Medical History:  Diagnosis Date  . Anxiety   . Arthritis   . Atrial fibrillation ([place])    a. Dx 03/2013, notes report atrial fibrillation/atrial flutter, placed on amiodarone. NSR in subsequent OV's.  . B12 deficiency anemia   . Chest pain    a. H/o CTA  negative for PE 2012, normal cath 2005, normal nucs previously including 05/2012.  Marland Kitchen Complete heart block (HCC)    a. s/p Medtronic Adapta L model ADDRL 1 (serial number [id number]) pacemaker.  Marland Kitchen GERD (gastroesophageal reflux disease)   . Hiatal hernia   . Hyperlipidemia   . Hypertension   . Iron deficiency anemia   . LBBB (left bundle branch block)   . Microcytic anemia   . Nephrolithiasis   . On home oxygen therapy    a. 2L w/CPAP at night  . Orthostatic hypotension   . OSA on CPAP    cpap  . [place] spotted fever ~ 1945  . Sinus drainage   . Skin cancer of lip   . Type II diabetes mellitus ([place])          Past Surgical History:  Procedure Laterality Date  . CARDIAC CATHETERIZATION     by Dr. Acie Fredrickson, January 24, 2004, that shows minimal coronary artery irregularities and normal left ventricular function  . CATARACT EXTRACTION W/ INTRAOCULAR LENS  IMPLANT, BILATERAL Bilateral   . ESOPHAGOGASTRODUODENOSCOPY (EGD) WITH PROPOFOL N/A 06/21/2015   Procedure: ESOPHAGOGASTRODUODENOSCOPY (EGD) WITH PROPOFOL;  Surgeon: Gatha Mayer, MD;  Location: [facility];  Service: Endoscopy;  Laterality: N/A;  . INGUINAL HERNIA REPAIR Right   . LUMBAR DISC SURGERY  ~ 1993  . PERMANENT PACEMAKER INSERTION N/A 06/03/2014   MDT Adapta L implanted by Dr Rayann Heman for syncope and transient AV block  . SKIN CANCER EXCISION     "lower lip" (04/06/2013)  . TEMPORARY PACEMAKER INSERTION N/A 05/31/2014   Procedure: Candis Shine;  Surgeon: Sinclair Grooms, MD;  Location: [facility];  Service: Cardiovascular;  Laterality: N/A;  . VASECTOMY     Hx of           Family History  Problem Relation Age of Onset  . Thyroid disease Mother     goiter  . Pneumonia Father   . Other      Parents both died of old age; other conditions not known  . Stroke Brother   . Breast cancer Sister     Social History:  Married. Independent PTA. Per reports that he  has quit smoking. His smoking use included Cigarettes. He smoked 1.00 packs per day for 20.00 years. He has never used smokeless tobacco. He reports that he does not drink alcohol or use drugs.         Allergies  Allergen Reactions  . Codeine Other (See Comments)    Hallucinations  . Citalopram Swelling and Rash           Medications Prior to Admission  Medication Sig Dispense Refill  . acetaminophen (TYLENOL) 500 MG tablet Take 500 mg by mouth 2 (two) times daily.     Marland Kitchen amiodarone (PACERONE) 200 MG tablet Take 200 mg by mouth 3 (three) times a week. On Monday, Wednesday & Friday    . amLODipine (NORVASC) 2.5 MG tablet Take 1 tablet (2.5 mg total) by mouth daily. 30 tablet 11  . benazepril (LOTENSIN) 40 MG tablet Take 40 mg by mouth daily.      . cyanocobalamin (,VITAMIN B-12,) 1000 MCG/ML injection Inject 1,000 mcg into the muscle every 30 (thirty) days.    . diazepam (VALIUM) 5 MG tablet Take 5 mg by mouth at bedtime.      . fluticasone (FLONASE) 50 MCG/ACT nasal spray Place 1 spray into the nose daily as needed for allergies or rhinitis.     . metFORMIN (GLUCOPHAGE) 1000 MG tablet Take 1,000 mg by mouth 2 (two) times daily with a meal.    . metoprolol (LOPRESSOR) 50 MG tablet TAKE 1 TABLET BY MOUTH TWICE A DAY 60 tablet 11  . omeprazole (PRILOSEC) 20 MG capsule Take 20 mg by mouth daily.     . OXYGEN Inhale into the lungs at bedtime. CPAP with OXYGEN    . pioglitazone (ACTOS) 30 MG tablet Take 30 mg by mouth daily.      . sertraline (ZOLOFT) 100 MG tablet Take 1 tablet by mouth daily.    Alveda Reasons 20 MG TABS tablet TAKE 1 TABLET (20 MG TOTAL) BY MOUTH DAILY WITH SUPPER. 30 tablet 11    Home: Home Living Family/patient expects to be discharged to:: Private residence Living Arrangements: Spouse/significant other Available Help at Discharge: Family Type of Home: House Home Access: Stairs to enter Technical brewer of Steps: 2-3 Home Layout:  Two level, Able to live on main level with bedroom/bathroom (basement) Bathroom Shower/Tub: Multimedia programmer: Handicapped height Home Equipment: Civil engineer, contracting, Grab bars - tub/shower, Radio producer - quad  Lives With: Spouse   Functional History: Prior Function Level of Independence: Independent Comments: Walks short distances (~30-40 feet). Uses cane for distance if needed. Drives. Hx of falls, increased falls recently.  Functional Status:  Mobility: Bed Mobility Overal bed mobility: Needs Assistance Bed Mobility: Supine to Sit Rolling: Min guard Sidelying to sit: Min assist Sit to supine: Min assist General bed mobility comments: pt having some trouble getting fully upright without assist Transfers Overall transfer level: Needs assistance Equipment used: 2 person hand held assist Transfers: Sit  to/from Stand, Risk manager Sit to Stand: Min assist, Mod assist General transfer comment: assist coming forward mod assist the lower down to the floor Ambulation/Gait Ambulation/Gait assistance: Mod assist (needed +2 for safety and not available) Ambulation Distance (Feet): 50 Feet (additional 10x2 to/from [facility]) Assistive device: Rolling walker (2 wheeled) Gait Pattern/deviations: Step-through pattern, Ataxic, Scissoring, Shuffle General Gait Details: mildly ataxic, left shuffled gait with incoordination and instability with RW.  Control needs to be exerted onto patient to help him stay under control with LE and trunk Gait velocity interpretation: Below normal speed for age/gender    ADL: ADL Overall ADL's : Needs assistance/impaired Eating/Feeding: Set up, Bed level Grooming: Minimal assistance, Sitting Upper Body Bathing: Minimal assitance, Sitting Lower Body Bathing: Moderate assistance, +2 for physical assistance, Sit to/from stand Upper Body Dressing : Minimal assistance, Sitting Lower Body Dressing: Moderate assistance, Sit to/from stand, +2 for physical  assistance Functional mobility during ADLs: Maximal assistance, +2 for physical assistance (for sit to stand only) General ADL Comments: +2 assist for sit to stand due to dizziness and unsteadiness. Assisted PT with vestibular eval.  Cognition: Cognition Overall Cognitive Status: History of cognitive impairments - at baseline Arousal/Alertness: Awake/alert Orientation Level: Oriented X4 Attention: Sustained Sustained Attention: Appears intact Memory: Impaired Memory Impairment: Retrieval deficit (recalled 1/5 words independently, 1/5 with category cue, 3/5 with multiple choice cue) Awareness: Appears intact (aware of premorbid memory and new vision deficits) Problem Solving: Appears intact (looking for call bell in his bed when could not locate) Safety/Judgment: Appears intact (need to call for assistance) Comments: pt acknowledges wife checks his medications to assure he is taking properly -  Cognition Arousal/Alertness: Awake/alert Behavior During Therapy: Flat affect Overall Cognitive Status: History of cognitive impairments - at baseline   Blood pressure (!) 153/65, pulse 68, temperature 98.3 F (36.8 C), temperature source Oral, resp. rate 20, height '6\' 3"'$  (1.905 m), weight 113.2 kg (249 lb 9 oz), SpO2 94 %. Physical Exam  Nursing note and vitals reviewed. Constitutional: He is oriented to person, place, and time. He appears well-developed and well-nourished. No distress.  HENT:  Head: Normocephalic and atraumatic.  Mouth/Throat: Oropharynx is clear and moist.  Eyes: Conjunctivae are normal. Pupils are equal, round, and reactive to light.  Neck: Normal range of motion. Neck supple.  Cardiovascular: Normal rate and regular rhythm.   Respiratory: Effort normal and breath sounds normal.  GI: Soft. Bowel sounds are normal. He exhibits no distension. There is no tenderness.  Musculoskeletal: He exhibits no edema or tenderness.  Neurological: He is alert and oriented to person,  place, and time.  Fatigued appearing.   Left facial weakness with moderate to severe dysarthria.   Very HOH. Able to follow basic one and two step commands without difficulty.   Motor: 4+/5 throughout  dysmetria, right greater than left. Finger-nose-finger and heel-to-shin  Sensory intact to light touch.  Proprioception intact  Skin: Skin is warm and dry. He is not diaphoretic.  Multiple bruises BUE and right shin  Psychiatric: He has a normal mood and affect. His behavior is normal.    Lab Results Last 48 Hours        Results for orders placed or performed during the hospital encounter of 05/05/16 (from the past 48 hour(s))  Glucose, capillary     Status: Abnormal   Collection Time: 05/06/16  5:18 PM  Result Value Ref Range   Glucose-Capillary 133 (H) 65 - 99 mg/dL  Glucose, capillary     Status:  Abnormal   Collection Time: 05/06/16  9:31 PM  Result Value Ref Range   Glucose-Capillary 141 (H) 65 - 99 mg/dL   Comment 1 Notify RN    Comment 2 Document in Chart   Glucose, capillary     Status: Abnormal   Collection Time: 05/07/16  6:36 AM  Result Value Ref Range   Glucose-Capillary 108 (H) 65 - 99 mg/dL   Comment 1 Notify RN    Comment 2 Document in Chart   Glucose, capillary     Status: Abnormal   Collection Time: 05/07/16 11:42 AM  Result Value Ref Range   Glucose-Capillary 105 (H) 65 - 99 mg/dL  Glucose, capillary     Status: Abnormal   Collection Time: 05/07/16  4:07 PM  Result Value Ref Range   Glucose-Capillary 141 (H) 65 - 99 mg/dL  Glucose, capillary     Status: Abnormal   Collection Time: 05/07/16  9:52 PM  Result Value Ref Range   Glucose-Capillary 119 (H) 65 - 99 mg/dL   Comment 1 Notify RN    Comment 2 Document in Chart   Glucose, capillary     Status: Abnormal   Collection Time: 05/08/16  6:47 AM  Result Value Ref Range   Glucose-Capillary 135 (H) 65 - 99 mg/dL  CBC with Differential/Platelet     Status: Abnormal   Collection  Time: 05/08/16  6:57 AM  Result Value Ref Range   WBC 4.4 4.0 - 10.5 K/uL   RBC 3.18 (L) 4.22 - 5.81 MIL/uL   Hemoglobin 9.5 (L) 13.0 - 17.0 g/dL   HCT 30.8 (L) 39.0 - 52.0 %   MCV 96.9 78.0 - 100.0 fL   MCH 29.9 26.0 - 34.0 pg   MCHC 30.8 30.0 - 36.0 g/dL   RDW 16.0 (H) 11.5 - 15.5 %   Platelets 128 (L) 150 - 400 K/uL   Neutrophils Relative % 65 %   Neutro Abs 2.8 1.7 - 7.7 K/uL   Lymphocytes Relative 18 %   Lymphs Abs 0.8 0.7 - 4.0 K/uL   Monocytes Relative 13 %   Monocytes Absolute 0.6 0.1 - 1.0 K/uL   Eosinophils Relative 3 %   Eosinophils Absolute 0.2 0.0 - 0.7 K/uL   Basophils Relative 1 %   Basophils Absolute 0.0 0.0 - 0.1 K/uL  Basic metabolic panel     Status: Abnormal   Collection Time: 05/08/16  6:57 AM  Result Value Ref Range   Sodium 140 135 - 145 mmol/L   Potassium 3.6 3.5 - 5.1 mmol/L   Chloride 104 101 - 111 mmol/L   CO2 28 22 - 32 mmol/L   Glucose, Bld 131 (H) 65 - 99 mg/dL   BUN 19 6 - 20 mg/dL   Creatinine, Ser 1.21 0.61 - 1.24 mg/dL   Calcium 8.9 8.9 - 10.3 mg/dL   GFR calc non Af Amer 54 (L) >60 mL/min   GFR calc Af Amer >60 >60 mL/min    Comment: (NOTE) The eGFR has been calculated using the CKD EPI equation. This calculation has not been validated in all clinical situations. eGFR's persistently <60 mL/min signify possible Chronic Kidney Disease.   Anion gap 8 5 - 15  Glucose, capillary     Status: Abnormal   Collection Time: 05/08/16 11:23 AM  Result Value Ref Range   Glucose-Capillary 132 (H) 65 - 99 mg/dL   Comment 1 Notify RN    Comment 2 Document in Chart  Imaging Results (Last 48 hours)  Ct Head Wo Contrast  Result Date: 05/08/2016 CLINICAL DATA:  Headache.  Prior stroke EXAM: CT HEAD WITHOUT CONTRAST TECHNIQUE: Contiguous axial images were obtained from the base of the skull through the vertex without intravenous contrast. COMPARISON:  Head CT 05/05/2016 FINDINGS: Brain: No mass lesion,  intraparenchymal hemorrhage or extra-axial collection. No evidence of acute cortical infarct. There is confluent periventricular hypoattenuation compatible with chronic microvascular ischemia. Vascular: There is calcific atherosclerosis of the intracranial internal carotid and vertebral arteries. Skull: Normal visualized skull base, calvarium and extracranial soft tissues. Sinuses/Orbits: There is an atelectatic left maxillary sinus with internal frothy secretions. The other paranasal sinuses are normal. No mastoid or middle ear effusion. Normal orbits. IMPRESSION: 1. No acute intracranial abnormality. 2. Chronic microvascular ischemia. Electronically Signed   By: Ulyses Jarred M.D.   On: 05/08/2016 05:32        Medical Problem List and Plan: 1.  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  DVT Prophylaxis/Anticoagulation: Pharmaceutical: Xarelto 3. Pain Management: N/A 4. Anxiety disorder/Mood: On Zoloft 100 mg daily as well as valium at bedtime (for years per wife). LCSW to follow for evaluation and support.  5. Neuropsych: This patient is capable of making decisions on his own behalf. 6. Skin/Wound Care: routine pressure relief measures.  7. Fluids/Electrolytes/Nutrition:   Maintain adequate nutrition and hydration status.  Monitor I/O. Check lytes in am.  8.T2DM: Was on metformin 1000 mg bid and actos 30 mg daily--Hgb A1c- 5.7.  Resume actos and monitor BS ac/hs for now. Continue SSI for elevated BS 9. A Fib: Monitor heart rate bid. Continue amiodarone--3 X weekly, metoprolol bid and Xarelto 10. Iron deficiency anemia/B 12 deficiency: Followed by Hem/Onc--Treated with iron infusion on outpatient basis.  Received monthly B 12 supplement today 11. OSA:  Resume CPAP with 2 Loxygen at nights.  12. CHB:  PPM in place. H/o orthostatic symptoms.  13. HTN: Monitor BID--off Norvasc at this time.   14. Chronic insomnia: Has been off Valium since admission.     Post Admission  Physician Evaluation: 1. Functional deficits secondary  to cerebellar infarct. 2. Patient is admitted to receive collaborative, interdisciplinary care between the physiatrist, rehab nursing staff, and therapy team. 3. Patient's level of medical complexity and substantial therapy needs in context of that medical necessity cannot be provided at a lesser intensity of care such as a SNF. 4. Patient has experienced substantial functional loss from his/her baseline which was documented above under the "Functional History" and "Functional Status" headings.  Judging by the patient's diagnosis, physical exam, and functional history, the patient has potential for functional progress which will result in measurable gains while on inpatient rehab.  These gains will be of substantial and practical use upon discharge  in facilitating mobility and self-care at the household level. 5. Physiatrist will provide 24 hour management of medical needs as well as oversight of the therapy plan/treatment and provide guidance as appropriate regarding the interaction of the two. 6. 24 hour rehab nursing will assist with bowel management, safety, skin/wound care, disease management, medication administration, pain management and patient education  and help integrate therapy concepts, techniques,education, etc. 7. PT will assess and treat for/with: pre gait, gait training, endurance , safety, equipment, neuromuscular re education.   Goals are: Sup. 8. OT will assess and treat for/with: ADLs, Cognitive perceptual skills, Neuromuscular re education, safety, endurance, equipment.   Goals are: Mod I. Therapy May proceed with showering this patient. 9. SLP  will assess and treat for/with: NA.  Goals are: NA. 10. Case Management and Social Worker will assess and treat for psychological issues and discharge planning. 11. Team conference will be held weekly to assess progress toward goals and to determine barriers to discharge. 12. Patient  will receive at least 3 hours of therapy per day at least 5 days per week. 13. ELOS: 14-17d       14. Prognosis:  good     [name] M.D. [facility] FAAPM&R (Sports Med, Neuromuscular Med) Diplomate [place] Board of Electrodiagnostic Med  05/08/2016

## 2016-05-11 ENCOUNTER — Inpatient Hospital Stay (HOSPITAL_COMMUNITY): Payer: Medicare Other

## 2016-05-11 ENCOUNTER — Inpatient Hospital Stay (HOSPITAL_COMMUNITY): Payer: Medicare Other | Admitting: Physical Therapy

## 2016-05-11 DIAGNOSIS — R269 Unspecified abnormalities of gait and mobility: Secondary | ICD-10-CM

## 2016-05-11 DIAGNOSIS — I69319 Unspecified symptoms and signs involving cognitive functions following cerebral infarction: Secondary | ICD-10-CM

## 2016-05-11 DIAGNOSIS — I69398 Other sequelae of cerebral infarction: Secondary | ICD-10-CM

## 2016-05-11 LAB — GLUCOSE, CAPILLARY
GLUCOSE-CAPILLARY: 121 mg/dL — AB (ref 65–99)
Glucose-Capillary: 142 mg/dL — ABNORMAL HIGH (ref 65–99)
Glucose-Capillary: 124 mg/dL — ABNORMAL HIGH (ref 65–99)
Glucose-Capillary: 106 mg/dL — ABNORMAL HIGH (ref 65–99)

## 2016-05-11 LAB — BASIC METABOLIC PANEL
Anion gap: 8 (ref 5–15)
BUN: 27 mg/dL — AB (ref 6–20)
CHLORIDE: 102 mmol/L (ref 101–111)
CO2: 29 mmol/L (ref 22–32)
CREATININE: 1.33 mg/dL — AB (ref 0.61–1.24)
Calcium: 8.6 mg/dL — ABNORMAL LOW (ref 8.9–10.3)
GFR calc Af Amer: 55 mL/min — ABNORMAL LOW (ref >60)
GFR calc non Af Amer: 48 mL/min — ABNORMAL LOW (ref >60)
Glucose, Bld: 148 mg/dL — ABNORMAL HIGH (ref 65–99)
Potassium: 3.5 mmol/L (ref 3.5–5.1)
Sodium: 139 mmol/L (ref 135–145)

## 2016-05-11 LAB — URINE CULTURE: Culture: <10000 — AB

## 2016-05-11 NOTE — Progress Notes (Addendum)
Administered prn pain regimen for h/a. Tolerated meds well. Ambulated pt to bathroom BUE bruising and skin tears. Educated pt on all medications

## 2016-05-11 NOTE — Progress Notes (Signed)
Subjective/Complaints: Seen in the gym today. No new issues identified. Slept well. ROS- - CP or SOB, - N/V/D Objective: Vital Signs: Blood pressure (!) 188/86, pulse 67, temperature 97.5 F (36.4 C), temperature source Oral, resp. rate (!) 22, weight 111.2 kg (245 lb 2.4 oz), SpO2 97 %. No results found. Results for orders placed or performed during the hospital encounter of 05/08/16 (from the past 72 hour(s))  Glucose, capillary     Status: None   Collection Time: 05/08/16  9:34 PM  Result Value Ref Range   Glucose-Capillary 96 65 - 99 mg/dL  Glucose, capillary     Status: Abnormal   Collection Time: 05/09/16  6:36 AM  Result Value Ref Range   Glucose-Capillary 141 (H) 65 - 99 mg/dL  CBC WITH DIFFERENTIAL     Status: Abnormal   Collection Time: 05/09/16  9:28 AM  Result Value Ref Range   WBC 6.3 4.0 - 10.5 K/uL   RBC 3.86 (L) 4.22 - 5.81 MIL/uL   Hemoglobin 11.4 (L) 13.0 - 17.0 g/dL   HCT 37.1 (L) 39.0 - 52.0 %   MCV 96.1 78.0 - 100.0 fL   MCH 29.5 26.0 - 34.0 pg   MCHC 30.7 30.0 - 36.0 g/dL   RDW 16.1 (H) 11.5 - 15.5 %   Platelets 207 150 - 400 K/uL   Neutrophils Relative % 66 %   Neutro Abs 4.2 1.7 - 7.7 K/uL   Lymphocytes Relative 18 %   Lymphs Abs 1.1 0.7 - 4.0 K/uL   Monocytes Relative 12 %   Monocytes Absolute 0.8 0.1 - 1.0 K/uL   Eosinophils Relative 3 %   Eosinophils Absolute 0.2 0.0 - 0.7 K/uL   Basophils Relative 1 %   Basophils Absolute 0.1 0.0 - 0.1 K/uL  Comprehensive metabolic panel     Status: Abnormal   Collection Time: 05/09/16  9:28 AM  Result Value Ref Range   Sodium 139 135 - 145 mmol/L   Potassium 3.4 (L) 3.5 - 5.1 mmol/L   Chloride 99 (L) 101 - 111 mmol/L   CO2 29 22 - 32 mmol/L   Glucose, Bld 208 (H) 65 - 99 mg/dL   BUN 17 6 - 20 mg/dL   Creatinine, Ser 1.30 (H) 0.61 - 1.24 mg/dL   Calcium 9.4 8.9 - 10.3 mg/dL   Total Protein 7.7 6.5 - 8.1 g/dL   Albumin 3.6 3.5 - 5.0 g/dL   AST 41 15 - 41 U/L   ALT 29 17 - 63 U/L   Alkaline  Phosphatase 94 38 - 126 U/L   Total Bilirubin 0.6 0.3 - 1.2 mg/dL   GFR calc non Af Amer 49 (L) >60 mL/min   GFR calc Af Amer 57 (L) >60 mL/min    Comment: (NOTE) The eGFR has been calculated using the CKD EPI equation. This calculation has not been validated in all clinical situations. eGFR's persistently <60 mL/min signify possible Chronic Kidney Disease.    Anion gap 11 5 - 15  Glucose, capillary     Status: Abnormal   Collection Time: 05/09/16 12:08 PM  Result Value Ref Range   Glucose-Capillary 121 (H) 65 - 99 mg/dL  Glucose, capillary     Status: Abnormal   Collection Time: 05/09/16  4:42 PM  Result Value Ref Range   Glucose-Capillary 123 (H) 65 - 99 mg/dL  Glucose, capillary     Status: Abnormal   Collection Time: 05/09/16  9:02 PM  Result Value Ref Range  Glucose-Capillary 141 (H) 65 - 99 mg/dL  Glucose, capillary     Status: Abnormal   Collection Time: 05/10/16  7:03 AM  Result Value Ref Range   Glucose-Capillary 145 (H) 65 - 99 mg/dL  Glucose, capillary     Status: Abnormal   Collection Time: 05/10/16 11:47 AM  Result Value Ref Range   Glucose-Capillary 142 (H) 65 - 99 mg/dL  Glucose, capillary     Status: Abnormal   Collection Time: 05/10/16  5:01 PM  Result Value Ref Range   Glucose-Capillary 167 (H) 65 - 99 mg/dL  Urinalysis, Routine w reflex microscopic (not at [facility])     Status: Abnormal   Collection Time: 05/10/16  5:19 PM  Result Value Ref Range   Color, Urine YELLOW YELLOW   APPearance CLEAR CLEAR   Specific Gravity, Urine 1.012 1.005 - 1.030   pH 5.0 5.0 - 8.0   Glucose, UA NEGATIVE NEGATIVE mg/dL   Hgb urine dipstick SMALL (A) NEGATIVE   Bilirubin Urine NEGATIVE NEGATIVE   Ketones, ur NEGATIVE NEGATIVE mg/dL   Protein, ur 100 (A) NEGATIVE mg/dL   Nitrite NEGATIVE NEGATIVE   Leukocytes, UA TRACE (A) NEGATIVE  Urine microscopic-add on     Status: Abnormal   Collection Time: 05/10/16  5:19 PM  Result Value Ref Range   Squamous Epithelial / LPF 0-5  (A) NONE SEEN   WBC, UA 0-5 0 - 5 WBC/hpf   RBC / HPF 0-5 0 - 5 RBC/hpf   Bacteria, UA RARE (A) NONE SEEN  Glucose, capillary     Status: Abnormal   Collection Time: 05/10/16  9:26 PM  Result Value Ref Range   Glucose-Capillary 145 (H) 65 - 99 mg/dL   Comment 1 Notify RN    Comment 2 Document in Chart   Basic metabolic panel     Status: Abnormal   Collection Time: 05/11/16  5:44 AM  Result Value Ref Range   Sodium 139 135 - 145 mmol/L   Potassium 3.5 3.5 - 5.1 mmol/L   Chloride 102 101 - 111 mmol/L   CO2 29 22 - 32 mmol/L   Glucose, Bld 148 (H) 65 - 99 mg/dL   BUN 27 (H) 6 - 20 mg/dL   Creatinine, Ser 1.33 (H) 0.61 - 1.24 mg/dL   Calcium 8.6 (L) 8.9 - 10.3 mg/dL   GFR calc non Af Amer 48 (L) >60 mL/min   GFR calc Af Amer 55 (L) >60 mL/min    Comment: (NOTE) The eGFR has been calculated using the CKD EPI equation. This calculation has not been validated in all clinical situations. eGFR's persistently <60 mL/min signify possible Chronic Kidney Disease.    Anion gap 8 5 - 15  Glucose, capillary     Status: Abnormal   Collection Time: 05/11/16  7:07 AM  Result Value Ref Range   Glucose-Capillary 142 (H) 65 - 99 mg/dL     HEENT: normal Cardio: RRR Resp: CTA B/L GI: BS positive Extremity:  Pulses positive and No Edema Skin:   Intact and Bruise forearm Neuro: Alert/Oriented and Abnormal Motor 4+ BUE an BLE Musc/Skel:  Normal and Other no pain with UE or LE ROM Gen NAD   Assessment/Plan: 1. 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 Physiatrist is providing close team supervision and 24 hour management of active medical problems listed below. Physiatrist and rehab team continue to assess barriers  to discharge/monitor patient progress toward functional and medical goals. FIM: Function - Bathing Position: Shower Body parts bathed by patient: Right arm,  Left arm, Chest, Abdomen, Front perineal area, Buttocks, Right upper leg, Left upper leg, Right lower leg, Left lower leg Body parts bathed by helper: Back Assist Level: Supervision or verbal cues  Function- Upper Body Dressing/Undressing What is the patient wearing?: Pull over shirt/dress Pull over shirt/dress - Perfomed by patient: Thread/unthread right sleeve, Thread/unthread left sleeve, Put head through opening, Pull shirt over trunk Assist Level: Set up Function - Lower Body Dressing/Undressing What is the patient wearing?: Underwear, Pants, Socks, Shoes Position: Wheelchair/chair at [employer] - Performed by patient: Thread/unthread right underwear leg, Thread/unthread left underwear leg, Pull underwear up/down Pants- Performed by patient: Thread/unthread right pants leg, Thread/unthread left pants leg, Pull pants up/down, Fasten/unfasten pants Socks - Performed by patient: Don/doff right sock, Don/doff left sock Shoes - Performed by patient: Don/doff right shoe, Don/doff left shoe, Fasten right, Fasten left Assist for footwear: Supervision/touching assist Assist for lower body dressing: Supervision or verbal cues  Function - Toileting Toileting steps completed by patient: Adjust clothing prior to toileting, Performs perineal hygiene, Adjust clothing after toileting Assist level: Touching or steadying assistance (Pt.75%)  Function - Air cabin crew transfer assistive device: Grab bar, Walker Assist level to toilet: Touching or steadying assistance (Pt > 75%) Assist level from toilet: Touching or steadying assistance (Pt > 75%)  Function - Chair/bed transfer Chair/bed transfer method: Stand pivot Chair/bed transfer assist level: Moderate assist (Pt 50 - 74%/lift or lower) Chair/bed transfer assistive device: Armrests  Function - Locomotion: Wheelchair Will patient use wheelchair at discharge?: No Max wheelchair distance: 120' Assist Level: Supervision or verbal  cues Assist Level: Supervision or verbal cues Wheel 150 feet activity did not occur: Safety/medical concerns (endurance) Turns around,maneuvers to table,bed, and toilet,negotiates 3% grade,maneuvers on rugs and over doorsills: No Function - Locomotion: Ambulation Assistive device: No device Max distance: 61' Assist level: 2 helpers Assist level: 2 helpers Assist level: 2 helpers Walk 150 feet activity did not occur: Safety/medical concerns  Function - Comprehension Comprehension: Auditory Comprehension assistive device: Hearing aids Comprehension assist level: Understands basic 75 - 89% of the time/ requires cueing 10 - 24% of the time  Function - Expression Expression: Verbal Expression assist level: Expresses basic 75 - 89% of the time/requires cueing 10 - 24% of the time. Needs helper to occlude trach/needs to repeat words.  Function - Social Interaction Social Interaction assist level: Interacts appropriately 90% of the time - Needs monitoring or encouragement for participation or interaction.  Function - Problem Solving Problem solving assist level: Solves basic 50 - 74% of the time/requires cueing 25 - 49% of the time  Function - Memory Memory assist level: Recognizes or recalls 50 - 74% of the time/requires cueing 25 - 49% of the time Patient normally able to recall (first 3 days only): Current season, Location of own room, That he or she is in a hospital Medical Problem List and Plan: 1.  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 His upper extremity appears more ataxic on the right side                       2.  DVT Prophylaxis/Anticoagulation: Pharmaceutical: Xarelto 3. Pain Management: N/A 4. Anxiety disorder/Mood: On Zoloft 100 mg daily as well as valium at  bedtime (for years per wife). LCSW to follow for evaluation and support.  5. Neuropsych: This patient is capable of making  decisions on his own behalf. 6. Skin/Wound Care: routine pressure relief measures.  7. Fluids/Electrolytes/Nutrition:   Maintain adequate nutrition and hydration status.  Monitor I/O. Repeat creatinine stable, repeat BUN slightly elevated at 27. Encourage fluid intake 8.T2DM: Was on metformin 1000 mg bid and actos 30 mg daily--Hgb A1c- 5.7.  Resume actos and monitor BS ac/hs for now. Continue SSI for elevated BS, well controlled CBG (last 3)   Recent Labs  05/10/16 1701 05/10/16 2126 05/11/16 0707  GLUCAP 167* 145* 142*    9. A Fib: Monitor heart rate bid. Continue amiodarone--3 X weekly, metoprolol bid and Xarelto 10. Iron deficiency anemia/B 12 deficiency: Followed by Hem/Onc--Treated with iron infusion on outpatient basis.  Received monthly B 12 supplement today 11. OSA:  Resume CPAP with 2 L oxygen at nights.  12. CHB:  PPM in place. H/o orthostatic symptoms.  13. HTN: Monitor BID--off Norvasc at this time.  Vitals:   05/10/16 2237 05/11/16 0510  BP:  (!) 188/86  Pulse: (!) 110 67  Resp: 16 (!) 22  Temp:  97.5 F (36.4 C)    14. Chronic insomnia: Has been off Valium since admission.   15.  Mild hypoK will supplement 16.  Constipation- had mucus PR will give laxative 17.  Urinary incont- Negative UA LOS (Days) 3 A FACE TO FACE EVALUATION WAS PERFORMED  Souleymane Saiki E 05/11/2016, 8:32 AM

## 2016-05-11 NOTE — Progress Notes (Signed)
Occupational Therapy Session Note  Patient Details  Name: Richard [name] MRN: [id number] Date of Birth: 1931/06/12  Today's Date: 05/11/2016 OT Individual Time:1100-1200 60 minutes  Short Term Goals: Week 1:  OT Short Term Goal 1 (Week 1): Pt will be able to stand and reach to the floor level to retrieve shoe with min A. OT Short Term Goal 2 (Week 1): Pt will be able to tolerate standing at the sink for 10 min to complete grooming to demonstrate improved activity tolerance. OT Short Term Goal 3 (Week 1): Pt will be able to step over shower stall ledge with min A.  Skilled Therapeutic Interventions/Progress Updates:  ADL-retraining at shower level with emphasis on improved sequencing, transfers, safety awareness, and lower body bathing/dressing skills.   Pt received seated in w/c with spouse present.  With min instructional cues and setup to provide RW, pt ambulated to bathroom to shower with contact guard for safety.   Pt reported shaving prior to session and expressed increased confidence with his gait and ambulation skills, suggesting that he considered use of RW as essential only for community mobility.   OT advised continued use of RW d/t evidence of instability during turns and mildly ambitious stride pace and length within room.  Pt transferred to tub bench and bathed with SBA, minimal vc required to sequence as pt exhibited improved sustained attention during this session.    Pt completed bathing and dressing while sitting on BSC over toilet with intermittent contact guard provided while standing to pull up his pants.    Pt donned socks and shoes while seated in his room and completed B & D within 40 minutes total time with good thoroughness.    OT advised continued use of RW to improved ability to negotiate device around obstacles and escorted pt to lobby area to manage RW around chairs and furniture until end of session.  Therapy Documentation Precautions:  Precautions Precautions:  Fall Precaution Comments: pt has experienced double vision and dizziness; improving Restrictions Weight Bearing Restrictions: No  Vital Signs: Therapy Vitals Temp: 97.5 F (36.4 C) Temp Source: Oral Pulse Rate: 67 Resp: (!) 22 BP: (!) 188/86 Patient Position (if appropriate): Sitting Oxygen Therapy SpO2: 97 % O2 Device: Not Delivered  Pain: Pain Assessment Pain Assessment: No/denies pain  ADL: ADL ADL Comments: refer to functional navigator  See Function Navigator for Current Functional Status.   Therapy/Group: Individual Therapy   Second session: Time: 1415-1500 Time Calculation (min):  45 min  Pain Assessment: No/denies pain  Skilled Therapeutic Interventions: Therapeutic activities with focus on general endurance, dynamic standing and functional mobility, safety awareness, and fall recovery training.   Pt received asleep in his recliner with family present in room.   OT oriented pt to planned task with trial of walking in his room with only hand-held assist.   Pt ambulated to bathroom to toilet and groomed at sink briefly before with contact guard 100% of the time while ambulating d/t unsteady gait.  Pt was then directed to ambulate to gym and required max vc to reduce stride pace, attend to location of handrails and maintain balance using handrails and wall surface.   Pt demo'd LOB 6 times during trial and was directed to sit at chairs in hallway for safety as OT retrieved and provided his RW.   From trial of ambulation pt improved awareness of balance deficits that he had minimized during BADL session.   Pt then used RW to ambulate to gym.  Pt  completed 7 minutes of general strengthening using NuStep, level 4, reporting only mild fatigue.   From NuStep, pt ambulated to raised mat and completed 5 minutes of string-ball toss to improve BUE coordination and strength in horizontal and vertical movement plane.   Pt demo'd ability to perform string-ball toss very well, with  minimal instruction and demonstration required.   OT then escorted pt to ADL apt and educated pt on fall recovery method.  With only supervision assist (minimal cues), pt recovered to loveseat from mat on floor without need for any physical assist.   Pt rested briefly, verbalized understanding of his need to cue his wife when/if he falls again at home,  and ambulated back to his room using RW with SBA.   See FIM for current functional status  Therapy/Group: Individual Therapy  [place] 05/11/2016, 7:41 AM

## 2016-05-11 NOTE — Progress Notes (Signed)
Physical Therapy Session Note  Patient Details  Name: Richard [name] MRN: [id number] Date of Birth: 09-11-1930  Today's Date: 05/11/2016 PT Individual Time: [id number] PT Individual Time Calculation (min): 30 min    Short Term Goals: Week 1:  PT Short Term Goal 1 (Week 1): Pt will perform transfers with consistent min assit PT Short Term Goal 2 (Week 1): Pt will be able to gait x 120' with min assist with LRAD PT Short Term Goal 3 (Week 1): Pt will be able to perform 3 stairs with R rail with min assist  Skilled Therapeutic Interventions/Progress Updates:    Pt was seen bedside in the pm. Pt propelled w/c to and from gym with B LEs and S with increased time. Pt performed multiple sit to stand transfers with min A and verbal cues.Pt ascended/descended 12 stairs with B rails and min A. Pt ambulated 75 feet x 2 with rolling walker and min A with verbal cues. Pt returned to room and left sitting up in w/c with wife at bedside.   Therapy Documentation Precautions:  Precautions Precautions: Fall Precaution Comments: pt has experienced double vision and dizziness; improving Restrictions Weight Bearing Restrictions: No General:   Pain: No c/o pain.  See Function Navigator for Current Functional Status.   Therapy/Group: Individual Therapy  Dub Amis 05/11/2016, 3:34 PM

## 2016-05-11 NOTE — Progress Notes (Signed)
Physical Therapy Session Note  Patient Details  Name: Richard [name] MRN: [id number] Date of Birth: 23-Jun-1931  Today's Date: 05/11/2016 PT Individual Time: 0900-1000 PT Individual Time Calculation (min): 60 min    Short Term Goals: Week 1:  PT Short Term Goal 1 (Week 1): Pt will perform transfers with consistent min assit PT Short Term Goal 2 (Week 1): Pt will be able to gait x 120' with min assist with LRAD PT Short Term Goal 3 (Week 1): Pt will be able to perform 3 stairs with R rail with min assist  Skilled Therapeutic Interventions/Progress Updates:    Pt was seen bedside in the am, sitting up in w/c. Pt propelled w/c about 125 feet with B LEs and S with increased time. Pt performed all sit to stand transfers with min A and verbal cues. Pt performed stand pivot transfers with min to mod A depending on surface height. Pt performed bed mobility with S and rolling with no assist. Treatment focused on NMR utilizing cone taps and alternating cone taps, 3 sets x 10 reps each. Pt ambulated 150 feet with rolling walker and min A with verbal cues. Pt returned to room following treatment and left sitting up with call bell within reach.   Therapy Documentation Precautions:  Precautions Precautions: Fall Precaution Comments: pt has experienced double vision and dizziness; improving Restrictions Weight Bearing Restrictions: No General:   Pain: No c/o pain.   See Function Navigator for Current Functional Status.   Therapy/Group: Individual Therapy  Dub Amis 05/11/2016, 12:30 PM

## 2016-05-12 ENCOUNTER — Inpatient Hospital Stay (HOSPITAL_COMMUNITY): Payer: Medicare Other

## 2016-05-12 ENCOUNTER — Inpatient Hospital Stay (HOSPITAL_COMMUNITY): Payer: Medicare Other | Admitting: *Deleted

## 2016-05-12 ENCOUNTER — Inpatient Hospital Stay (HOSPITAL_COMMUNITY): Payer: Medicare Other | Admitting: Occupational Therapy

## 2016-05-12 LAB — GLUCOSE, CAPILLARY
GLUCOSE-CAPILLARY: 109 mg/dL — AB (ref 65–99)
GLUCOSE-CAPILLARY: 139 mg/dL — AB (ref 65–99)
Glucose-Capillary: 139 mg/dL — ABNORMAL HIGH (ref 65–99)
Glucose-Capillary: 149 mg/dL — ABNORMAL HIGH (ref 65–99)

## 2016-05-12 MED ORDER — AMLODIPINE BESYLATE 2.5 MG PO TABS
2.5000 mg | ORAL_TABLET | Freq: Every day | ORAL | Status: DC
  Administered 2016-05-12 – 2016-05-14 (×3): 2.5 mg via ORAL
  Filled 2016-05-12 (×3): qty 1

## 2016-05-12 NOTE — Progress Notes (Signed)
Subjective/Complaints: Had a good day yesterday, spoke about hx of falls over the last year, ? vertebro basilar TIAs? ROS- - CP or SOB, - N/V/D Objective: Vital Signs: Blood pressure (!) 172/75, pulse 61, temperature 98.4 F (36.9 C), temperature source Oral, resp. rate 20, weight 113.3 kg (249 lb 12.5 oz), SpO2 96 %. No results found. Results for orders placed or performed during the hospital encounter of 05/08/16 (from the past 72 hour(s))  CBC WITH DIFFERENTIAL     Status: Abnormal   Collection Time: 05/09/16  9:28 AM  Result Value Ref Range   WBC 6.3 4.0 - 10.5 K/uL   RBC 3.86 (L) 4.22 - 5.81 MIL/uL   Hemoglobin 11.4 (L) 13.0 - 17.0 g/dL   HCT 37.1 (L) 39.0 - 52.0 %   MCV 96.1 78.0 - 100.0 fL   MCH 29.5 26.0 - 34.0 pg   MCHC 30.7 30.0 - 36.0 g/dL   RDW 16.1 (H) 11.5 - 15.5 %   Platelets 207 150 - 400 K/uL   Neutrophils Relative % 66 %   Neutro Abs 4.2 1.7 - 7.7 K/uL   Lymphocytes Relative 18 %   Lymphs Abs 1.1 0.7 - 4.0 K/uL   Monocytes Relative 12 %   Monocytes Absolute 0.8 0.1 - 1.0 K/uL   Eosinophils Relative 3 %   Eosinophils Absolute 0.2 0.0 - 0.7 K/uL   Basophils Relative 1 %   Basophils Absolute 0.1 0.0 - 0.1 K/uL  Comprehensive metabolic panel     Status: Abnormal   Collection Time: 05/09/16  9:28 AM  Result Value Ref Range   Sodium 139 135 - 145 mmol/L   Potassium 3.4 (L) 3.5 - 5.1 mmol/L   Chloride 99 (L) 101 - 111 mmol/L   CO2 29 22 - 32 mmol/L   Glucose, Bld 208 (H) 65 - 99 mg/dL   BUN 17 6 - 20 mg/dL   Creatinine, Ser 1.30 (H) 0.61 - 1.24 mg/dL   Calcium 9.4 8.9 - 10.3 mg/dL   Total Protein 7.7 6.5 - 8.1 g/dL   Albumin 3.6 3.5 - 5.0 g/dL   AST 41 15 - 41 U/L   ALT 29 17 - 63 U/L   Alkaline Phosphatase 94 38 - 126 U/L   Total Bilirubin 0.6 0.3 - 1.2 mg/dL   GFR calc non Af Amer 49 (L) >60 mL/min   GFR calc Af Amer 57 (L) >60 mL/min    Comment: (NOTE) The eGFR has been calculated using the CKD EPI equation. This calculation has not been validated  in all clinical situations. eGFR's persistently <60 mL/min signify possible Chronic Kidney Disease.    Anion gap 11 5 - 15  Glucose, capillary     Status: Abnormal   Collection Time: 05/09/16 12:08 PM  Result Value Ref Range   Glucose-Capillary 121 (H) 65 - 99 mg/dL  Glucose, capillary     Status: Abnormal   Collection Time: 05/09/16  4:42 PM  Result Value Ref Range   Glucose-Capillary 123 (H) 65 - 99 mg/dL  Glucose, capillary     Status: Abnormal   Collection Time: 05/09/16  9:02 PM  Result Value Ref Range   Glucose-Capillary 141 (H) 65 - 99 mg/dL  Glucose, capillary     Status: Abnormal   Collection Time: 05/10/16  7:03 AM  Result Value Ref Range   Glucose-Capillary 145 (H) 65 - 99 mg/dL  Glucose, capillary     Status: Abnormal   Collection Time: 05/10/16 11:47 AM  Result Value Ref Range   Glucose-Capillary 142 (H) 65 - 99 mg/dL  Glucose, capillary     Status: Abnormal   Collection Time: 05/10/16  5:01 PM  Result Value Ref Range   Glucose-Capillary 167 (H) 65 - 99 mg/dL  Urinalysis, Routine w reflex microscopic (not at [facility])     Status: Abnormal   Collection Time: 05/10/16  5:19 PM  Result Value Ref Range   Color, Urine YELLOW YELLOW   APPearance CLEAR CLEAR   Specific Gravity, Urine 1.012 1.005 - 1.030   pH 5.0 5.0 - 8.0   Glucose, UA NEGATIVE NEGATIVE mg/dL   Hgb urine dipstick SMALL (A) NEGATIVE   Bilirubin Urine NEGATIVE NEGATIVE   Ketones, ur NEGATIVE NEGATIVE mg/dL   Protein, ur 100 (A) NEGATIVE mg/dL   Nitrite NEGATIVE NEGATIVE   Leukocytes, UA TRACE (A) NEGATIVE  Urine culture     Status: Abnormal   Collection Time: 05/10/16  5:19 PM  Result Value Ref Range   Specimen Description URINE, RANDOM    Special Requests NONE    Culture <10,000 COLONIES/mL INSIGNIFICANT GROWTH (A)    Report Status 05/11/2016 FINAL   Urine microscopic-add on     Status: Abnormal   Collection Time: 05/10/16  5:19 PM  Result Value Ref Range   Squamous Epithelial / LPF 0-5 (A) NONE  SEEN   WBC, UA 0-5 0 - 5 WBC/hpf   RBC / HPF 0-5 0 - 5 RBC/hpf   Bacteria, UA RARE (A) NONE SEEN  Glucose, capillary     Status: Abnormal   Collection Time: 05/10/16  9:26 PM  Result Value Ref Range   Glucose-Capillary 145 (H) 65 - 99 mg/dL   Comment 1 Notify RN    Comment 2 Document in Chart   Basic metabolic panel     Status: Abnormal   Collection Time: 05/11/16  5:44 AM  Result Value Ref Range   Sodium 139 135 - 145 mmol/L   Potassium 3.5 3.5 - 5.1 mmol/L   Chloride 102 101 - 111 mmol/L   CO2 29 22 - 32 mmol/L   Glucose, Bld 148 (H) 65 - 99 mg/dL   BUN 27 (H) 6 - 20 mg/dL   Creatinine, Ser 1.33 (H) 0.61 - 1.24 mg/dL   Calcium 8.6 (L) 8.9 - 10.3 mg/dL   GFR calc non Af Amer 48 (L) >60 mL/min   GFR calc Af Amer 55 (L) >60 mL/min    Comment: (NOTE) The eGFR has been calculated using the CKD EPI equation. This calculation has not been validated in all clinical situations. eGFR's persistently <60 mL/min signify possible Chronic Kidney Disease.    Anion gap 8 5 - 15  Glucose, capillary     Status: Abnormal   Collection Time: 05/11/16  7:07 AM  Result Value Ref Range   Glucose-Capillary 142 (H) 65 - 99 mg/dL  Glucose, capillary     Status: Abnormal   Collection Time: 05/11/16 11:43 AM  Result Value Ref Range   Glucose-Capillary 121 (H) 65 - 99 mg/dL  Glucose, capillary     Status: Abnormal   Collection Time: 05/11/16  4:33 PM  Result Value Ref Range   Glucose-Capillary 124 (H) 65 - 99 mg/dL  Glucose, capillary     Status: Abnormal   Collection Time: 05/11/16 10:15 PM  Result Value Ref Range   Glucose-Capillary 106 (H) 65 - 99 mg/dL  Glucose, capillary     Status: Abnormal   Collection Time: 05/12/16  6:54  AM  Result Value Ref Range   Glucose-Capillary 139 (H) 65 - 99 mg/dL     HEENT: normal Cardio: RRR Resp: CTA B/L GI: BS positive Extremity:  Pulses positive and No Edema Skin:   Intact and Bruise forearm Neuro: Alert/Oriented and Abnormal Motor 4+ BUE an  BLE Musc/Skel:  Normal and Other no pain with UE or LE ROM Gen NAD   Assessment/Plan: 1. 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 Physiatrist is providing close team supervision and 24 hour management of active medical problems listed below. Physiatrist and rehab team continue to assess barriers to discharge/monitor patient progress toward functional and medical goals. FIM: Function - Bathing Position: Shower Body parts bathed by patient: Right arm, Left arm, Chest, Abdomen, Front perineal area, Buttocks, Right upper leg, Right lower leg, Left upper leg, Left lower leg Body parts bathed by helper: Back Assist Level: Touching or steadying assistance(Pt > 75%)  Function- Upper Body Dressing/Undressing What is the patient wearing?: Pull over shirt/dress Pull over shirt/dress - Perfomed by patient: Thread/unthread right sleeve, Thread/unthread left sleeve, Put head through opening, Pull shirt over trunk Assist Level: Set up Set up : To obtain clothing/put away Function - Lower Body Dressing/Undressing What is the patient wearing?: Underwear, Pants, Socks, Shoes Position: Wheelchair/chair at sink Underwear - Performed by patient: Thread/unthread right underwear leg, Thread/unthread left underwear leg, Pull underwear up/down Pants- Performed by patient: Thread/unthread right pants leg, Thread/unthread left pants leg, Pull pants up/down, Fasten/unfasten pants Socks - Performed by patient: Don/doff right sock, Don/doff left sock Shoes - Performed by patient: Don/doff right shoe, Don/doff left shoe, Fasten right, Fasten left Assist for footwear: Supervision/touching assist Assist for lower body dressing: Supervision or verbal cues, Set up Set up : To obtain clothing/put away  Function - Toileting Toileting steps completed by patient: Adjust clothing prior to toileting,  Performs perineal hygiene, Adjust clothing after toileting Assist level: Touching or steadying assistance (Pt.75%)  Function - Toilet Transfers Toilet transfer assistive device: Grab bar, Walker Assist level to toilet: Touching or steadying assistance (Pt > 75%) Assist level from toilet: Touching or steadying assistance (Pt > 75%)  Function - Chair/bed transfer Chair/bed transfer method: Stand pivot Chair/bed transfer assist level: Moderate assist (Pt 50 - 74%/lift or lower) Chair/bed transfer assistive device: Armrests  Function - Locomotion: Wheelchair Will patient use wheelchair at discharge?: No Type: Manual Max wheelchair distance: 125 Assist Level: Supervision or verbal cues Assist Level: Supervision or verbal cues Wheel 150 feet activity did not occur: Safety/medical concerns (endurance) Turns around,maneuvers to table,bed, and toilet,negotiates 3% grade,maneuvers on rugs and over doorsills: No Function - Locomotion: Ambulation Assistive device: Walker-rolling Max distance: 75 Assist level: Touching or steadying assistance (Pt > 75%) Assist level: Touching or steadying assistance (Pt > 75%) Assist level: Touching or steadying assistance (Pt > 75%) Walk 150 feet activity did not occur: Safety/medical concerns Assist level: Touching or steadying assistance (Pt > 75%) Assist level: Moderate assist (Pt 50 - 74%)  Function - Comprehension Comprehension: Auditory Comprehension assistive device: Hearing aids Comprehension assist level: Understands basic 75 - 89% of the time/ requires cueing 10 - 24% of the time  Function - Expression Expression: Verbal Expression assist level: Expresses basic 75 - 89% of the time/requires cueing 10 - 24% of the time. Needs helper to occlude trach/needs to repeat words.  Function - Social Interaction Social Interaction assist level: Interacts appropriately  90% of the time - Needs monitoring or encouragement for participation or  interaction.  Function - Problem Solving Problem solving assist level: Solves basic 50 - 74% of the time/requires cueing 25 - 49% of the time  Function - Memory Memory assist level: Recognizes or recalls 50 - 74% of the time/requires cueing 25 - 49% of the time Patient normally able to recall (first 3 days only): Current season, Location of own room, That he or she is in a hospital Medical Problem List and Plan: 1.  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 His upper extremity appears more ataxic on the right side                       2.  DVT Prophylaxis/Anticoagulation: Pharmaceutical: Xarelto 3. Pain Management: N/A 4. Anxiety disorder/Mood: On Zoloft 100 mg daily as well as valium at bedtime (for years per wife). LCSW to follow for evaluation and support.  5. Neuropsych: This patient is capable of making decisions on his own behalf. 6. Skin/Wound Care: routine pressure relief measures.  7. Fluids/Electrolytes/Nutrition:   Maintain adequate nutrition and hydration status.  Monitor I/O. Mar[place] Kitchen Encourage fluid intake 8.T2DM: Was on metformin 1000 mg bid and actos 30 mg daily--Hgb A1c- 5.7.  Resume actos and monitor BS ac/hs for now. Continue SSI for elevated BS, well controlled CBG (last 3)   Recent Labs  05/11/16 1633 05/11/16 2215 05/12/16 0654  GLUCAP 124* 106* 139*    9. A Fib: Monitor heart rate bid. Continue amiodarone--3 X weekly, metoprolol bid and Xarelto 10. Iron deficiency anemia/B 12 deficiency: Followed by Hem/Onc--Treated with iron infusion on outpatient basis.  Received monthly B 12 supplement today 11. OSA:  Resume CPAP with 2 L oxygen at nights.  12. CHB:  PPM in place. H/o orthostatic symptoms.  13. HTN: Monitor BID--resume Norvasc 2.'5mg'$  Vitals:   05/11/16 2129 05/12/16 0410  BP: (!) 175/75 (!) 172/75  Pulse: 73 61  Resp: 18 20  Temp:  98.4 F (36.9 C)    14. Chronic  insomnia: Has been off Valium since admission.   15.  Mild hypoK will supplement 16.  Constipation- had mucus PR will give laxative  LOS (Days) 4 A FACE TO FACE EVALUATION WAS PERFORMED  Nicci Vaughan E 05/12/2016, 7:35 AM

## 2016-05-12 NOTE — Discharge Instructions (Addendum)
Inpatient Rehab Discharge Instructions  Richard [name] Discharge date and time:    Activities/Precautions/ Functional Status: Activity: no lifting, driving, or strenuous exercise for till cleared by MD.  Diet: cardiac diet and diabetic diet Wound Care:    Functional status:  ___ No restrictions     ___ Walk up steps independently ___ 24/7 supervision/assistance   ___ Walk up steps with assistance ___ Intermittent supervision/assistance  ___ Bathe/dress independently ___ Walk with walker     ___ Bathe/dress with assistance ___ Walk Independently    ___ Shower independently ___ Walk with assistance    ___ Shower with assistance ___ No alcohol     ___ Return to work/school ________  COMMUNITY REFERRALS UPON DISCHARGE:   Home Health:  PT       OT         RN              Agency:  [facility]       Phone:  [phone] Medical Equipment/Items Ordered:  Rolling Walker  Agency/Supplier:  [facility] [facility]       Phone:  [phone]  GENERAL COMMUNITY RESOURCES FOR PATIENT/FAMILY:  Support Groups:  [facility] Stroke Support Group                              Meets the 2nd Thursday of every month (except for June, July, and August) at [place]                              In the dayroom of the Inpatient Rehab Unit at [facility] on [address]  Special Instructions:   [place] Cigarette smoking nearly doubles your risk of having a stroke & is the single most alterable risk factor  If you smoke or have smoked in the last 12 months, you are advised to quit smoking for your health.  Most of the excess cardiovascular risk related to smoking disappears within a year of stopping.  Ask you doctor about anti-smoking medications  [place] Quit Line: 1-800-QUIT NOW  Free Smoking Cessation Classes [phone]  CHOLESTEROL Know your levels; limit fat & cholesterol in your diet  Lipid Panel     Component Value Date/Time   CHOL 136 05/06/2016 0531    TRIG 70 05/06/2016 0531   HDL 38 (L) 05/06/2016 0531   CHOLHDL 3.6 05/06/2016 0531   VLDL 14 05/06/2016 0531   LDLCALC 84 05/06/2016 0531      Many patients benefit from treatment even if their cholesterol is at goal.  Goal: Total Cholesterol (CHOL) less than 160  Goal:  Triglycerides (TRIG) less than 150  Goal:  HDL greater than 40  Goal:  LDL (LDLCALC) less than 100   BLOOD PRESSURE American Stroke Association blood pressure target is less that 120/80 mm/Hg  Your discharge blood pressure is:  BP: (!) 172/75  Monitor your blood pressure  Limit your salt and alcohol intake  Many individuals will require more than one medication for high blood pressure  DIABETES (A1c is a blood sugar average for last 3 months) Goal HGBA1c is under 7% (HBGA1c is blood sugar average for last 3 months)  Diabetes:     Lab Results  Component Value Date   HGBA1C 5.7 (H) 05/06/2016     Your HGBA1c can be lowered with medications, healthy diet, and exercise.  Check your blood sugar as directed by your physician  Call your physician if you experience unexplained or low blood sugars.  PHYSICAL ACTIVITY/REHABILITATION Goal is 30 minutes at least 4 days per week  Activity: No driving, Therapies:  See above Return to work:  N/A  Activity decreases your risk of heart attack and stroke and makes your heart stronger.  It helps control your weight and blood pressure; helps you relax and can improve your mood.  Participate in a regular exercise program.  Talk with your doctor about the best form of exercise for you (dancing, walking, swimming, cycling).  DIET/WEIGHT Goal is to maintain a healthy weight  Your discharge diet is: Diet heart healthy/carb modified Room service appropriate? Yes; Fluid consistency: Thin  liquids Your height is:    Your current weight is: Weight: 113.3 kg (249 lb 12.5 oz) Your Body Mass Index (BMI) is:     Following the type of diet specifically designed for you will  help prevent another stroke.  Your goal weight range is:   Your goal Body Mass Index (BMI) is 19-24.  Healthy food habits can help reduce 3 risk factors for stroke:  High cholesterol, hypertension, and excess weight.  RESOURCES Stroke/Support Group:  Call [phone]   STROKE EDUCATION PROVIDED/REVIEWED AND GIVEN TO PATIENT Stroke warning signs and symptoms How to activate emergency medical system (call 911). Medications prescribed at discharge. Need for follow-up after discharge. Personal risk factors for stroke. Pneumonia vaccine given:  Flu vaccine given:  My questions have been answered, the writing is legible, and I understand these instructions.  I will adhere to these goals & educational materials that have been provided to me after my discharge from the hospital.      My questions have been answered and I understand these instructions. I will adhere to these goals and the provided educational materials after my discharge from the hospital.  Patient/Caregiver Signature _______________________________ Date __________  Clinician Signature _______________________________________ Date __________  Please bring this form and your medication list with you to all your follow-up doctor's appointments.     Rivaroxaban oral tablets What is this medicine? RIVAROXABAN (ri va ROX a ban) is an anticoagulant (blood thinner). It is used to treat blood clots in the lungs or in the veins. It is also used after knee or hip surgeries to prevent blood clots. It is also used to lower the chance of stroke in people with a medical condition called atrial fibrillation. This medicine may be used for other purposes; ask your health care provider or pharmacist if you have questions. What should I tell my health care provider before I take this medicine? They need to know if you have any of these conditions: -bleeding disorders -bleeding in the brain -blood in your stools (black or tarry stools) or  if you have blood in your vomit -history of stomach bleeding -kidney disease -liver disease -low blood counts, like low white cell, platelet, or red cell counts -recent or planned spinal or epidural procedure -take medicines that treat or prevent blood clots -an unusual or allergic reaction to rivaroxaban, other medicines, foods, dyes, or preservatives -pregnant or trying to get pregnant -breast-feeding How should I use this medicine? Take this medicine by mouth with a glass of water. Follow the directions on the prescription label. Take your medicine at regular intervals. Do not take it more often than directed. Do not stop taking except on your doctor's advice. Stopping this medicine may increase your risk of a blood  clot. Be sure to refill your prescription before you run out of medicine. If you are taking this medicine after hip or knee replacement surgery, take it with or without food. If you are taking this medicine for atrial fibrillation, take it with your evening meal. If you are taking this medicine to treat blood clots, take it with food at the same time each day. If you are unable to swallow your tablet, you may crush the tablet and mix it in applesauce. Then, immediately eat the applesauce. You should eat more food right after you eat the applesauce containing the crushed tablet. Talk to your pediatrician regarding the use of this medicine in children. Special care may be needed. Overdosage: If you think you have taken too much of this medicine contact a poison control center or emergency room at once. NOTE: This medicine is only for you. Do not share this medicine with others. What if I miss a dose? If you take your medicine once a day and miss a dose, take the missed dose as soon as you remember. If you take your medicine twice a day and miss a dose, take the missed dose immediately. In this instance, 2 tablets may be taken at the same time. The next day you should take 1 tablet twice  a day as directed. What may interact with this medicine? -aspirin and aspirin-like medicines -certain antibiotics like erythromycin, azithromycin, and clarithromycin -certain medicines for fungal infections like ketoconazole and itraconazole -certain medicines for irregular heart beat like amiodarone, quinidine, dronedarone -certain medicines for seizures like carbamazepine, phenytoin -certain medicines that treat or prevent blood clots like warfarin, enoxaparin, and dalteparin -conivaptan -diltiazem -felodipine -indinavir -lopinavir; ritonavir -NSAIDS, medicines for pain and inflammation, like ibuprofen or naproxen -ranolazine -rifampin -ritonavir -St. John's wort -verapamil This list may not describe all possible interactions. Give your health care provider a list of all the medicines, herbs, non-prescription drugs, or dietary supplements you use. Also tell them if you smoke, drink alcohol, or use illegal drugs. Some items may interact with your medicine. What should I watch for while using this medicine? Visit your doctor or health care professional for regular checks on your progress. Your condition will be monitored carefully while you are receiving this medicine. Notify your doctor or health care professional and seek emergency treatment if you develop breathing problems; changes in vision; chest pain; severe, sudden headache; pain, swelling, warmth in the leg; trouble speaking; sudden numbness or weakness of the face, arm, or leg. These can be signs that your condition has gotten worse. If you are going to have surgery, tell your doctor or health care professional that you are taking this medicine. Tell your health care professional that you use this medicine before you have a spinal or epidural procedure. Sometimes people who take this medicine have bleeding problems around the spine when they have a spinal or epidural procedure. This bleeding is very rare. If you have a spinal or  epidural procedure while on this medicine, call your health care professional immediately if you have back pain, numbness or tingling (especially in your legs and feet), muscle weakness, paralysis, or loss of bladder or bowel control. Avoid sports and activities that might cause injury while you are using this medicine. Severe falls or injuries can cause unseen bleeding. Be careful when using sharp tools or knives. Consider using an Copy. Take special care brushing or flossing your teeth. Report any injuries, bruising, or red spots on the skin to your doctor  or health care professional. What side effects may I notice from receiving this medicine? Side effects that you should report to your doctor or health care professional as soon as possible: -allergic reactions like skin rash, itching or hives, swelling of the face, lips, or tongue -back pain -redness, blistering, peeling or loosening of the skin, including inside the mouth -signs and symptoms of bleeding such as bloody or black, tarry stools; red or dark-brown urine; spitting up blood or brown material that looks like coffee grounds; red spots on the skin; unusual bruising or bleeding from the eye, gums, or nose Side effects that usually do not require medical attention (Report these to your doctor or health care professional if they continue or are bothersome.): -dizziness -muscle pain This list may not describe all possible side effects. Call your doctor for medical advice about side effects. You may report side effects to FDA at 1-800-FDA-1088. Where should I keep my medicine? Keep out of the reach of children. Store at room temperature between 15 and 30 degrees C (59 and 86 degrees F). Throw away any unused medicine after the expiration date. NOTE: This sheet is a summary. It may not cover all possible information. If you have questions about this medicine, talk to your doctor, pharmacist, or health care provider.    2016,  Elsevier/Gold Standard. (2014-08-09 12:45:34)

## 2016-05-12 NOTE — Progress Notes (Signed)
Occupational Therapy Session Note  Patient Details  Name: Richard [name] MRN: [id number] Date of Birth: August 14, 1931  Today's Date: 05/12/2016 OT Individual Time: [id number] Individual Time Calculation (min): 30 min     Short Term Goals: Week 1:  OT Short Term Goal 1 (Week 1): Pt will be able to stand and reach to the floor level to retrieve shoe with min A. OT Short Term Goal 2 (Week 1): Pt will be able to tolerate standing at the sink for 10 min to complete grooming to demonstrate improved activity tolerance. OT Short Term Goal 3 (Week 1): Pt will be able to step over shower stall ledge with min A.  Skilled Therapeutic Interventions/Progress Updates:    Pt worked on standing balance in the therapy gym.  Noted LOB with feet together and eyes closed requiring min assist to correct.  He incorporated functional reach with the LUE to retrieve items to complete PVC puzzle.  Mod instructional cueing to identify parts and complete puzzle in standing with one LOB to the left noted when reaching and not having UE support on the table.  Finished session with ambulation to the ADL bathroom for toileting.  Min assist needed for mobility using the RW with mod instructional cueing to not push the walker too far in front of him.  Pt left on toilet with PT to finish assisting with toileting task.   Therapy Documentation Precautions:  Precautions Precautions: Fall Precaution Comments:   Restrictions Weight Bearing Restrictions: No  Pain: Pain Assessment Pain Assessment: No/denies pain ADL: ADL ADL Comments: refer to functional navigator    See Function Navigator for Current Functional Status.   Therapy/Group: Individual Therapy  Aberdeen Hafen OTR/L 05/12/2016, 4:33 PM

## 2016-05-12 NOTE — Progress Notes (Signed)
Physical Therapy Session Note  Patient Details  Name: RHYTHM ALATORRE MRN: [id number] Date of Birth: 07/30/1931  Today's Date: 05/12/2016 PT Individual Time: 1430-1500 PT Individual Time Calculation (min): 30 min    Short Term Goals:Week 1:  PT Short Term Goal 1 (Week 1): Pt will perform transfers with consistent min assit PT Short Term Goal 2 (Week 1): Pt will be able to gait x 120' with min assist with LRAD PT Short Term Goal 3 (Week 1): Pt will be able to perform 3 stairs with R rail with min assist  Skilled Therapeutic Interventions/Progress Updates:  Tx focused on functional mobility training, gait with RW, and NMR via forced use, manual facilitation, and multi-modal cues. Performed Berg Balance test and 5x STS. Pt scored 22/56 and 38.44 sec respectively. Pt and wife educated on findings and functional implications. Pt highly resistant to new learning and principles of rehab and recovery, but emotional support and education provided.   Gait in controlled setting 1x150' with RW and min-guard A with cues for upright posture.  Pt needed up to Mod A for lifting or lowering during all transfers to prevent uncontrolled descent.   Handoff to OT      Therapy Documentation Precautions:  Precautions Precautions: Fall Precaution Comments: pt has experienced double vision and dizziness; improving Restrictions Weight Bearing Restrictions: No General:   Vital Signs: Therapy Vitals Pulse Rate: 65 BP: (!) 158/71 Patient Position (if appropriate): Sitting Pain: none     Locomotion : Gait Gait: Yes Gait velocity: 2.73'/sec with RW for 10 MWT  Trunk/Postural Assessment :    Balance: Standardized Balance Assessment Standardized Balance Assessment: Berg Balance Test Berg Balance Test Sit to Stand: Needs moderate or maximal assist to stand Standing Unsupported: Able to stand 2 minutes with supervision Sitting with Back Unsupported but Feet Supported on Floor or Stool: Able to sit  safely and securely 2 minutes Stand to Sit: Sits independently, has uncontrolled descent Transfers: Needs one person to assist Standing Unsupported with Eyes Closed: Able to stand 10 seconds with supervision Standing Ubsupported with Feet Together: Able to place feet together independently but unable to hold for 30 seconds From Standing, Reach Forward with Outstretched Arm: Can reach forward >12 cm safely (5") From Standing Position, Pick up Object from Floor: Unable to try/needs assist to keep balance (Fear of back pain/refused) From Standing Position, Turn to Look Behind Over each Shoulder: Turn sideways only but maintains balance Turn 360 Degrees: Needs close supervision or verbal cueing Standing Unsupported, Alternately Place Feet on Step/Stool: Needs assistance to keep from falling or unable to try Standing Unsupported, One Foot in Front: Able to take small step independently and hold 30 seconds Standing on One Leg: Unable to try or needs assist to prevent fall Total Score: 22 Exercises:   Other Treatments:     See Function Navigator for Current Functional Status.   Therapy/Group: Individual Therapy  Soundra Pilon 05/12/2016, 3:03 PM

## 2016-05-12 NOTE — Progress Notes (Signed)
Occupational Therapy Session Note  Patient Details  Name: Richard [name] MRN: [id number] Date of Birth: 18-Sep-1930  Today's Date: 05/12/2016 OT Individual Time: [id number] OT Individual Time Calculation (min): 55 min     Short Term Goals: Week 1:  OT Short Term Goal 1 (Week 1): Pt will be able to stand and reach to the floor level to retrieve shoe with min A. OT Short Term Goal 2 (Week 1): Pt will be able to tolerate standing at the sink for 10 min to complete grooming to demonstrate improved activity tolerance. OT Short Term Goal 3 (Week 1): Pt will be able to step over shower stall ledge with min A.  Skilled Therapeutic Interventions/Progress Updates:    Pt completed eating from wheelchair level to start session with use of BUEs and LUE as dominant.  He was able to ambulate to the shower after eating with min assist hand held.  Noted increased LOB posteriorly with initial standing as well as with sit to stand in the shower.  He needed min instructional cueing to remember to remove his socks before turning on the water in the shower.  Dressing sit to stand from the 3:1.  He needing min instructional cueing to donn underpants and pants over the RLE first before the left.  Performed functional mobility in the hallway to finish session with pt needing min assist for balance.  Noted staggering to the left as well as pt moving too quickly.  Pt left in wheelchair at end of session with wife present.   Therapy Documentation Precautions:  Precautions Precautions: Fall Precaution Comments: pt has experienced double vision and dizziness; improving Restrictions Weight Bearing Restrictions: No  Vital Signs: Therapy Vitals Pulse Rate: 77 BP: (!) 126/56 Patient Position (if appropriate): Sitting Oxygen Therapy SpO2: 94 % O2 Device: Not Delivered Pain: Pain Assessment Pain Assessment: No/denies pain ADL: ADL ADL Comments: refer to functional navigator   See Function Navigator for Current  Functional Status.   Therapy/Group: Individual Therapy  Joson Sapp OTR/L 05/12/2016, 9:32 AM

## 2016-05-12 NOTE — Progress Notes (Signed)
Physical Therapy Session Note  Patient Details  Name: Richard [name] MRN: [id number] Date of Birth: 18-Apr-1931  Today's Date: 05/12/2016; 1535-1605 PT Individual Time:0918  - 1000, 42 min, 30 min    Short Term Goals: Week 1:  PT Short Term Goal 1 (Week 1): Pt will perform transfers with consistent min assit PT Short Term Goal 2 (Week 1): Pt will be able to gait x 120' with min assist with LRAD PT Short Term Goal 3 (Week 1): Pt will be able to perform 3 stairs with R rail with min assist  Skilled Therapeutic Interventions/Progress Updates: tx 1:   neuromuscular re-education to propel w/c using bil LEs for alternating reciprocal movement; NUStep at level 5 x 5 min using 4 extremities, x 5 min using bil LEs only;  supine 5 x 3 bridging focusing on smoothness of movement; lower trunk rotation 5 x 2 with adductor hold for abdominal activation.  Gait with Rw over level tile x 150' with cues for safety to slow down and keep RW closer.  Pt declined manipulation of objects in a squatting position due to back pain that it would cause.  Pt c/o fatigue; BP 138/73 and HR 72.  Pt reported that when he was not mowing his land, he rested in his recliner at home; wife confirmed.  Wife stated that pt has fallen 5-6 x in last year; work up at hospitals was non conclusive.  Pt left resting in w/c with quick release belt applied and all needs within reach.     tx 2:  Pt sitting on toilet at start of session.  Pt continent of B and B.  Advanced gait sidestepping L >< R with bil UE support, x 8' each direction.  Hamstring curls L/R x 5 each.  Gait to return to room with seated break in chair due to fatigue.  Gait in room in congested area unsafe; pt attempting to put Rw to the side and not use it.  Mod assist to step sideways while using RW.  Pt left resting in recliner with quick release belt applied and all needs within reach.  Therapy Documentation Precautions:  Precautions Precautions: Fall Precaution Comments:  pt has experienced double vision and dizziness; improving Restrictions Weight Bearing Restrictions: No Pain: none reported       See Function Navigator for Current Functional Status.   Therapy/Group: Individual Therapy  Precious Gilchrest 05/12/2016, 7:50 AM

## 2016-05-13 ENCOUNTER — Inpatient Hospital Stay (HOSPITAL_COMMUNITY): Payer: Medicare Other | Admitting: Physical Therapy

## 2016-05-13 ENCOUNTER — Inpatient Hospital Stay (HOSPITAL_COMMUNITY): Payer: Medicare Other | Admitting: Occupational Therapy

## 2016-05-13 DIAGNOSIS — K5901 Slow transit constipation: Secondary | ICD-10-CM

## 2016-05-13 LAB — GLUCOSE, CAPILLARY
GLUCOSE-CAPILLARY: 131 mg/dL — AB (ref 65–99)
GLUCOSE-CAPILLARY: 176 mg/dL — AB (ref 65–99)
GLUCOSE-CAPILLARY: 115 mg/dL — AB (ref 65–99)
Glucose-Capillary: 188 mg/dL — ABNORMAL HIGH (ref 65–99)

## 2016-05-13 NOTE — Progress Notes (Signed)
Physical Therapy Session Note  Patient Details  Name: Richard [name] MRN: [id number] Date of Birth: 1930-11-30  Today's Date: 05/13/2016 PT Individual Time: 0900-1000 1300-1415 PT Individual Time Calculation (min): 60 min 75 min   Short Term Goals: Week 1:  PT Short Term Goal 1 (Week 1): Pt will perform transfers with consistent min assit PT Short Term Goal 2 (Week 1): Pt will be able to gait x 120' with min assist with LRAD PT Short Term Goal 3 (Week 1): Pt will be able to perform 3 stairs with R rail with min assist  Skilled Therapeutic Interventions/Progress Updates:    Tx 1 Pt presented in gym agreeable to therapy. Demonstrated decreased safety with transfers requiring cues for sequencing and keeping close to RW. Performed static balance activities including reaching high/low across midline on flat surface and with one foot on step for increased challenge. Pt also performed toe taps alternating and tapping at varying heights with RW support noted decreased coordination with LLE vs RLE.  Practiced sit to stand transfers x 6 between arm chair and mat with cues for safety and hand placement with improved carryover. Performed obstacle course with cues to keep RW on ground when performing turns with improvement noted. Gait with RW for increased endurance noted decreased L foot clearance with fatigue.  Tx 2 Pt presented in w/c agreeable for therapy.  Pt ambulated to rehab gym with cues from PTA to decrease speed for safety. Pt performed NuStep L5 x 82mn with bilateral upper and lower extremities, x545m with only BLE.. Marland KitchenPt ascend/decend x8 steps with bilateral rail with CGA.  Performed standing and seated LE therex including LAQ, standing hip abd/add, standing HS curls, heel rises x 10 bilaterally. Pt also performed supine bridges 2 sets/5 with verbal cues for improved controlled movement.  Pt with min c/o back pain stating but declining adv nsg/pain meds. Performed hooklying HS pulls with physio  ball , and lower trunk rotation 2x 10 ea.  Theraputic rests provided through session as needed.  Pt ambulated additional [id number] 3 including carpeted surfaces with brief seated rest after each bout.  Pt noted no LOB throughout session.  Pt returned to room with wife present and all current needs met.    Therapy Documentation Precautions:  Precautions Precautions: Fall Precaution Comments:   Restrictions Weight Bearing Restrictions: No General:   Vital Signs: Therapy Vitals Temp: 98.4 F (36.9 C) Temp Source: Oral Pulse Rate: 60 Resp: 18 BP: (!) 158/79 Patient Position (if appropriate): Lying Oxygen Therapy SpO2: 99 % O2 Device: Not Delivered Pain:   See Function Navigator for Current Functional Status.   Therapy/Group: Individual Therapy  Cartier Mapel  Jeniya Flannigan, PTA  05/13/2016, 4:09 PM

## 2016-05-13 NOTE — Progress Notes (Signed)
Nutrition Brief Note  Patient identified on the Malnutrition Screening Tool (MST) Report  Wt Readings from Last 15 Encounters:  05/11/16 249 lb 12.5 oz (113.3 kg)  05/05/16 249 lb 9 oz (113.2 kg)  02/08/16 250 lb (113.4 kg)  02/01/16 247 lb (112 kg)  11/20/15 257 lb (116.6 kg)  10/01/15 254 lb 12.8 oz (115.6 kg)  09/21/15 251 lb (113.9 kg)  08/15/15 257 lb (116.6 kg)  07/17/15 248 lb (112.5 kg)  06/21/15 253 lb (114.8 kg)  06/04/15 253 lb 2 oz (114.8 kg)  05/15/15 260 lb 4.8 oz (118.1 kg)  03/13/15 258 lb (117 kg)  02/20/15 261 lb (118.4 kg)  02/05/15 257 lb (116.6 kg)    Body mass index is 31.22 kg/m. Patient meets criteria for class I obesity based on current BMI. Pt with no significant weight loss.   Current diet order is heart healthy/carbohydrate modified, patient is consuming approximately 100% of meals at this time. Appetite is fine with no other difficulties. Labs and medications reviewed.   No nutrition interventions warranted at this time. If nutrition issues arise, please consult RD.   Richard Parker, MS, RD, LDN Pager # [phone] After hours/ weekend pager # [phone]

## 2016-05-13 NOTE — Progress Notes (Signed)
Occupational Therapy Session Note  Patient Details  Name: Richard [name] MRN: [id number] Date of Birth: 1931/06/16  Today's Date: 05/13/2016 OT Individual Time: [id number] OT Individual Time Calculation (min): 64 min     Short Term Goals: Week 1:  OT Short Term Goal 1 (Week 1): Pt will be able to stand and reach to the floor level to retrieve shoe with min A. OT Short Term Goal 2 (Week 1): Pt will be able to tolerate standing at the sink for 10 min to complete grooming to demonstrate improved activity tolerance. OT Short Term Goal 3 (Week 1): Pt will be able to step over shower stall ledge with min A.  Skilled Therapeutic Interventions/Progress Updates:    Pt completed self feeding to start the session with independence.  He was able to transition to the EOB with close supervision from flat bed without rails.  Min assist for sit to stand when pulling pants over hips, with supervision for donning socks and shoes in sitting.  He was able to ambulate to the closet for gathering his shirt with the RW and min assist.  Mod instructional cueing for staying inside of the walker during the transfer.  Ambulated down to the therapy gym with min guard assist.  Worked on dynamic standing balance using foam pad for standing.  Increased swaying noted with delayed reactions.  LOB posteriorly when attempting to complete head turns and simple reaching to target.  Pt left sitting on the EOM with PT present.   Therapy Documentation Precautions:  Precautions Precautions: Fall Precaution Comments:   Restrictions Weight Bearing Restrictions: No  Pain: Pain Assessment Pain Assessment: No/denies pain ADL: See Function Navigator for Current Functional Status.   Therapy/Group: Individual Therapy  Janeisha Ryle OTR/L 05/13/2016, 9:50 AM

## 2016-05-13 NOTE — Progress Notes (Signed)
Subjective/Complaints: No issues overnite, Pt feels like he is starting to walk better ROS- - CP or SOB, - N/V/D Objective: Vital Signs: Blood pressure (!) 166/71, pulse 60, temperature 97.6 F (36.4 C), temperature source Axillary, resp. rate 20, weight 113.3 kg (249 lb 12.5 oz), SpO2 99 %. No results found. Results for orders placed or performed during the hospital encounter of 05/08/16 (from the past 72 hour(s))  Glucose, capillary     Status: Abnormal   Collection Time: 05/10/16 11:47 AM  Result Value Ref Range   Glucose-Capillary 142 (H) 65 - 99 mg/dL  Glucose, capillary     Status: Abnormal   Collection Time: 05/10/16  5:01 PM  Result Value Ref Range   Glucose-Capillary 167 (H) 65 - 99 mg/dL  Urinalysis, Routine w reflex microscopic (not at [facility])     Status: Abnormal   Collection Time: 05/10/16  5:19 PM  Result Value Ref Range   Color, Urine YELLOW YELLOW   APPearance CLEAR CLEAR   Specific Gravity, Urine 1.012 1.005 - 1.030   pH 5.0 5.0 - 8.0   Glucose, UA NEGATIVE NEGATIVE mg/dL   Hgb urine dipstick SMALL (A) NEGATIVE   Bilirubin Urine NEGATIVE NEGATIVE   Ketones, ur NEGATIVE NEGATIVE mg/dL   Protein, ur 100 (A) NEGATIVE mg/dL   Nitrite NEGATIVE NEGATIVE   Leukocytes, UA TRACE (A) NEGATIVE  Urine culture     Status: Abnormal   Collection Time: 05/10/16  5:19 PM  Result Value Ref Range   Specimen Description URINE, RANDOM    Special Requests NONE    Culture <10,000 COLONIES/mL INSIGNIFICANT GROWTH (A)    Report Status 05/11/2016 FINAL   Urine microscopic-add on     Status: Abnormal   Collection Time: 05/10/16  5:19 PM  Result Value Ref Range   Squamous Epithelial / LPF 0-5 (A) NONE SEEN   WBC, UA 0-5 0 - 5 WBC/hpf   RBC / HPF 0-5 0 - 5 RBC/hpf   Bacteria, UA RARE (A) NONE SEEN  Glucose, capillary     Status: Abnormal   Collection Time: 05/10/16  9:26 PM  Result Value Ref Range   Glucose-Capillary 145 (H) 65 - 99 mg/dL   Comment 1 Notify RN    Comment 2  Document in Chart   Basic metabolic panel     Status: Abnormal   Collection Time: 05/11/16  5:44 AM  Result Value Ref Range   Sodium 139 135 - 145 mmol/L   Potassium 3.5 3.5 - 5.1 mmol/L   Chloride 102 101 - 111 mmol/L   CO2 29 22 - 32 mmol/L   Glucose, Bld 148 (H) 65 - 99 mg/dL   BUN 27 (H) 6 - 20 mg/dL   Creatinine, Ser 1.33 (H) 0.61 - 1.24 mg/dL   Calcium 8.6 (L) 8.9 - 10.3 mg/dL   GFR calc non Af Amer 48 (L) >60 mL/min   GFR calc Af Amer 55 (L) >60 mL/min    Comment: (NOTE) The eGFR has been calculated using the CKD EPI equation. This calculation has not been validated in all clinical situations. eGFR's persistently <60 mL/min signify possible Chronic Kidney Disease.    Anion gap 8 5 - 15  Glucose, capillary     Status: Abnormal   Collection Time: 05/11/16  7:07 AM  Result Value Ref Range   Glucose-Capillary 142 (H) 65 - 99 mg/dL  Glucose, capillary     Status: Abnormal   Collection Time: 05/11/16 11:43 AM  Result Value  Ref Range   Glucose-Capillary 121 (H) 65 - 99 mg/dL  Glucose, capillary     Status: Abnormal   Collection Time: 05/11/16  4:33 PM  Result Value Ref Range   Glucose-Capillary 124 (H) 65 - 99 mg/dL  Glucose, capillary     Status: Abnormal   Collection Time: 05/11/16 10:15 PM  Result Value Ref Range   Glucose-Capillary 106 (H) 65 - 99 mg/dL  Glucose, capillary     Status: Abnormal   Collection Time: 05/12/16  6:54 AM  Result Value Ref Range   Glucose-Capillary 139 (H) 65 - 99 mg/dL  Glucose, capillary     Status: Abnormal   Collection Time: 05/12/16 11:51 AM  Result Value Ref Range   Glucose-Capillary 109 (H) 65 - 99 mg/dL  Glucose, capillary     Status: Abnormal   Collection Time: 05/12/16  5:03 PM  Result Value Ref Range   Glucose-Capillary 149 (H) 65 - 99 mg/dL  Glucose, capillary     Status: Abnormal   Collection Time: 05/12/16  8:56 PM  Result Value Ref Range   Glucose-Capillary 139 (H) 65 - 99 mg/dL   Comment 1 Notify RN      HEENT:  normal Cardio: RRR Resp: CTA B/L GI: BS positive Extremity:  Pulses positive and No Edema Skin:   Intact and Bruise forearm Neuro: Alert/Oriented and Abnormal Motor 4+ BUE an BLE Musc/Skel:  Normal and Other no pain with UE or LE ROM Gen NAD   Assessment/Plan: 1. 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 Physiatrist is providing close team supervision and 24 hour management of active medical problems listed below. Physiatrist and rehab team continue to assess barriers to discharge/monitor patient progress toward functional and medical goals. FIM: Function - Bathing Position: Shower Body parts bathed by patient: Right arm, Left arm, Chest, Abdomen, Front perineal area, Buttocks, Right upper leg, Right lower leg, Left upper leg, Left lower leg Body parts bathed by helper: Back Assist Level: Touching or steadying assistance(Pt > 75%)  Function- Upper Body Dressing/Undressing What is the patient wearing?: Pull over shirt/dress Pull over shirt/dress - Perfomed by patient: Thread/unthread right sleeve, Thread/unthread left sleeve, Put head through opening, Pull shirt over trunk Assist Level: Touching or steadying assistance(Pt > 75%) Set up : To obtain clothing/put away Function - Lower Body Dressing/Undressing What is the patient wearing?: Underwear, Pants, Socks, Shoes Position: Other (comment) (Sitting on 3:1) Underwear - Performed by patient: Thread/unthread right underwear leg, Thread/unthread left underwear leg, Pull underwear up/down Pants- Performed by patient: Thread/unthread right pants leg, Thread/unthread left pants leg, Pull pants up/down, Fasten/unfasten pants Socks - Performed by patient: Don/doff right sock, Don/doff left sock Shoes - Performed by patient: Don/doff right shoe, Don/doff left shoe, Fasten right, Fasten left Assist for footwear:  Supervision/touching assist Assist for lower body dressing: Touching or steadying assistance (Pt > 75%) Set up : To obtain clothing/put away  Function - Toileting Toileting steps completed by patient: Adjust clothing prior to toileting, Performs perineal hygiene, Adjust clothing after toileting Toileting steps completed by helper: Adjust clothing after toileting Toileting Assistive Devices: Grab bar or rail Assist level: Touching or steadying assistance (Pt.75%)  Function - Toilet Transfers Toilet transfer assistive device: Grab bar, Walker Assist level to toilet: Touching or steadying assistance (Pt > 75%) Assist level from toilet: Touching or steadying assistance (Pt > 75%)  Function - Chair/bed transfer Chair/bed transfer  method: Ambulatory Chair/bed transfer assist level: Moderate assist (Pt 50 - 74%/lift or lower) Chair/bed transfer assistive device: Walker Chair/bed transfer details: Verbal cues for safe use of DME/AE, Verbal cues for technique  Function - Locomotion: Wheelchair Will patient use wheelchair at discharge?: No Type: Manual Max wheelchair distance: 150 Assist Level: Supervision or verbal cues Assist Level: Supervision or verbal cues Wheel 150 feet activity did not occur: Safety/medical concerns (endurance) Assist Level: Supervision or verbal cues Turns around,maneuvers to table,bed, and toilet,negotiates 3% grade,maneuvers on rugs and over doorsills: No Function - Locomotion: Ambulation Assistive device: Walker-rolling Max distance: 150 Assist level: Touching or steadying assistance (Pt > 75%) Assist level: Touching or steadying assistance (Pt > 75%) Assist level: Touching or steadying assistance (Pt > 75%) Walk 150 feet activity did not occur: Safety/medical concerns Assist level: Touching or steadying assistance (Pt > 75%) Walk 10 feet on uneven surfaces activity did not occur: Safety/medical concerns Assist level: Moderate assist (Pt 50 - 74%)  Function  - Comprehension Comprehension: Auditory Comprehension assistive device: Hearing aids Comprehension assist level: Understands basic 90% of the time/cues < 10% of the time  Function - Expression Expression: Verbal Expression assist level: Expresses basic 75 - 89% of the time/requires cueing 10 - 24% of the time. Needs helper to occlude trach/needs to repeat words.  Function - Social Interaction Social Interaction assist level: Interacts appropriately with others - No medications needed.  Function - Problem Solving Problem solving assist level: Solves basic 50 - 74% of the time/requires cueing 25 - 49% of the time  Function - Memory Memory assist level: Recognizes or recalls 50 - 74% of the time/requires cueing 25 - 49% of the time Patient normally able to recall (first 3 days only): Current season, Location of own room, That he or she is in a hospital Medical Problem List and Plan: 1.  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 His upper extremity appears more ataxic on the right side    Team conf in am                   2.  DVT Prophylaxis/Anticoagulation: Pharmaceutical: Xarelto 3. Pain Management: N/A 4. Anxiety disorder/Mood: On Zoloft 100 mg daily as well as valium at bedtime (for years per wife). LCSW to follow for evaluation and support.  5. Neuropsych: This patient is capable of making decisions on his own behalf. 6. Skin/Wound Care: routine pressure relief measures.  7. Fluids/Electrolytes/Nutrition:   Maintain adequate nutrition and hydration status.  Monitor I/O. Marland Kitchen Encourage fluid intake 8.T2DM: Was on metformin 1000 mg bid and actos 30 mg daily--Hgb A1c- 5.7.  Resume actos and monitor BS ac/hs for now. Continue SSI for elevated BS, well controlled CBG (last 3)   Recent Labs  05/12/16 1151 05/12/16 1703 05/12/16 2056  GLUCAP 109* 149* 139*    9. A Fib: Monitor heart rate bid.  Continue amiodarone--3 X weekly, metoprolol bid and Xarelto 10. Iron deficiency anemia/B 12 deficiency: Followed by Hem/Onc--Treated with iron infusion on outpatient basis.  Received monthly B 12 supplement today 11. OSA:  Resume CPAP with 2 L oxygen at nights.  12. CHB:  PPM in place. H/o orthostatic symptoms.  13. HTN: Monitor BID--resume Norvasc 2.41m, cont current dose expect effect to maximize in 2-3 d Vitals:   05/12/16 2219 05/13/16 0620  BP:  (!) 166/71  Pulse: 69 60  Resp: 18  20  Temp:  97.6 F (36.4 C)    14. Chronic insomnia: Has been off Valium since admission.   15.  Mild hypoK will supplement 16.  Constipation- had BM, no prn laxative given per RN  LOS (Days) 5 A FACE TO FACE EVALUATION WAS PERFORMED  Richard [name] 05/13/2016, 7:19 AM

## 2016-05-14 ENCOUNTER — Inpatient Hospital Stay (HOSPITAL_COMMUNITY): Payer: Medicare Other | Admitting: Occupational Therapy

## 2016-05-14 ENCOUNTER — Inpatient Hospital Stay (HOSPITAL_COMMUNITY): Payer: Medicare Other | Admitting: Physical Therapy

## 2016-05-14 ENCOUNTER — Inpatient Hospital Stay (HOSPITAL_COMMUNITY): Payer: Medicare Other

## 2016-05-14 ENCOUNTER — Inpatient Hospital Stay (HOSPITAL_COMMUNITY): Payer: Medicare Other | Admitting: *Deleted

## 2016-05-14 LAB — GLUCOSE, CAPILLARY
GLUCOSE-CAPILLARY: 155 mg/dL — AB (ref 65–99)
Glucose-Capillary: 136 mg/dL — ABNORMAL HIGH (ref 65–99)
Glucose-Capillary: 134 mg/dL — ABNORMAL HIGH (ref 65–99)
Glucose-Capillary: 124 mg/dL — ABNORMAL HIGH (ref 65–99)

## 2016-05-14 NOTE — Progress Notes (Signed)
Occupational Therapy Session Note  Patient Details  Name: Richard [name] MRN: [id number] Date of Birth: 07-16-1931  Today's Date: 05/14/2016 OT Individual Time: 5789-7847 OT Individual Time Calculation (min): 45 min     Short Term Goals:Week 1:  OT Short Term Goal 1 (Week 1): Pt will be able to stand and reach to the floor level to retrieve shoe with min A. OT Short Term Goal 2 (Week 1): Pt will be able to tolerate standing at the sink for 10 min to complete grooming to demonstrate improved activity tolerance. OT Short Term Goal 3 (Week 1): Pt will be able to step over shower stall ledge with min A.      Skilled Therapeutic Interventions/Progress Updates: Pt seen for skilled OT to facilitate dynamic balance and cognitive skills and postural coordination with ADL retraining. Pt received in bed anxious to shower.  Pt sat to EOB and stood up to RW with close S. Steadying A to ambulated to toilet then shower. In shower, cues to keep one hand on bar in standing to maintain balance with S while washing hips.  Ambulated out to w/c to dress with close S. Pt stood to RW and used one hand at a time to pull up pants. Pt needed min cues for safety at times and to not rush himself.  At end of session, pt feeling tired and requested to go back to bed. Pt resting in bed with bed alarm on and all needs met.     Therapy Documentation Precautions:  Precautions Precautions: Fall Precaution Comments:   Restrictions Weight Bearing Restrictions: No    Vital Signs: Therapy Vitals Pulse Rate: 82 BP: (!) 180/86 Pain: Pain Assessment Pain Assessment: No/denies pain ADL: ADL ADL Comments: refer to functional navigator  See Function Navigator for Current Functional Status.   Therapy/Group: Individual Therapy  Nancylee Gaines 05/14/2016, 10:59 AM

## 2016-05-14 NOTE — Evaluation (Signed)
Recreational Therapy Assessment and Plan  Patient Details  Name: Richard [name] MRN: [id number] Date of Birth: 1930/10/24 Today's Date: 05/14/2016  Assessment Clinical Impression:  Problem List: Patient Active Problem List   Diagnosis Date Noted  . Acute right PCA stroke ([place]) 05/08/2016  . Ataxia due to recent stroke   . Gait disturbance, post-stroke   . Cerebellar stroke, acute ([place]) 05/07/2016  . Hyperlipidemia   . Chronic anticoagulation   . Cardiac pacemaker in situ   . Acute blood loss anemia   . Cognitive deficits   . Cognitive deficit due to recent stroke   . Cognitive deficit due to recent cerebrovascular accident (CVA)   . Generalized weakness 05/05/2016  . Dizziness   . Absolute anemia 02/08/2016  . Weakness 02/01/2016  . Gastric polyp 06/21/2015  . Anemia, iron deficiency   . GERD (gastroesophageal reflux disease)   . B12 deficiency anemia   . Bradycardia with less than 30 beats per minute 05/31/2014  . Complete heart block ([place]) 05/31/2014  . Syncope 05/31/2014  . Anemia 05/31/2014  . Atrial fibrillation ([place]) 04/08/2013  . Diabetes mellitus ([place]) 04/06/2013  . Hiatal hernia 04/06/2013  . Benign essential HTN 06/13/2011  . OSA (obstructive sleep apnea) 06/13/2011    Past Medical History:      Past Medical History:  Diagnosis Date  . Anxiety   . Arthritis   . Atrial fibrillation ([place])    a. Dx 03/2013, notes report atrial fibrillation/atrial flutter, placed on amiodarone. NSR in subsequent OV's.  . B12 deficiency anemia   . Chest pain    a. H/o CTA negative for PE 2012, normal cath 2005, normal nucs previously including 05/2012.  Marland Kitchen Complete heart block ([facility])    a. s/p Medtronic Adapta L model ADDRL 1 (serial number [id number]) pacemaker.  Marland Kitchen GERD (gastroesophageal reflux disease)   . Hiatal hernia   . Hyperlipidemia   . Hypertension   . Iron deficiency anemia   . LBBB (left bundle branch block)   . Microcytic anemia    . Nephrolithiasis   . On home oxygen therapy    a. 2L w/CPAP at night  . Orthostatic hypotension   . OSA on CPAP    cpap  . [place] spotted fever ~ 1945  . Sinus drainage   . Skin cancer of lip   . Type II diabetes mellitus ([place])    Past Surgical History:       Past Surgical History:  Procedure Laterality Date  . CARDIAC CATHETERIZATION     by Dr. Acie Fredrickson, January 24, 2004, that shows minimal coronary artery irregularities and normal left ventricular function  . CATARACT EXTRACTION W/ INTRAOCULAR LENS  IMPLANT, BILATERAL Bilateral   . ESOPHAGOGASTRODUODENOSCOPY (EGD) WITH PROPOFOL N/A 06/21/2015   Procedure: ESOPHAGOGASTRODUODENOSCOPY (EGD) WITH PROPOFOL;  Surgeon: Gatha Mayer, MD;  Location: [facility] ENDOSCOPY;  Service: Endoscopy;  Laterality: N/A;  . INGUINAL HERNIA REPAIR Right   . LUMBAR DISC SURGERY  ~ 1993  . PERMANENT PACEMAKER INSERTION N/A 06/03/2014   MDT Adapta L implanted by Dr Rayann Heman for syncope and transient AV block  . SKIN CANCER EXCISION     "lower lip" (04/06/2013)  . TEMPORARY PACEMAKER INSERTION N/A 05/31/2014   Procedure: TEMPORARY WIRE;  Surgeon: Sinclair Grooms, MD;  Location: [facility] CATH LAB;  Service: Cardiovascular;  Laterality: N/A;  . VASECTOMY     Hx of     Assessment & Plan Clinical Impression: Patient is a 82 y.o. year  old male with recent admission to the hospital with history of A fib-on xarelto, B 12 deficiency, T2DM, HTN, OSA, CHB s/p PPM, cognitive impairements who was admitted on 05/05/16 with complaints of weakness, vertigo and double vision. CT head negative for acute changes. CTA head/neck done revealing no occlusions, dissection or significant stenosis. Patient with nystagmus and RUE/RLE dysmetria felt to be due to small cerebellar stroke. Neurology recommended adding low dose ASA to posterior circulation infarct likely due to SVD and continuing Xarelto. Speech therapy evaluation revealed baseline dysarthria and  severe cognitive deficits. Therapy ongoing and patient with balance deficits , dizziness and nystagmus with head turn. CIR recommended for follow up therapy..  Patient transferred to CIR on 05/08/2016.   Met with pt briefly today to discuss TR services.  Pt pleasant and talkative but states a great deal fatigue form currently therapies and requests no additional sessions.  Discussed importance of staying active & community pursuits.  Pt stated understanding.  No further TR.  The above assessment, treatment plan, treatment alternatives and goals were discussed and mutually agreed upon: by patient  [place] 05/14/2016, 4:05 PM

## 2016-05-14 NOTE — Progress Notes (Signed)
Physical Therapy Session Note  Patient Details  Name: Richard [name] MRN: [id number] Date of Birth: [birth year]  Today's Date: 05/14/2016 PT Individual Time: 1007-1107 PT Individual Time Calculation (min): 60 min   Short Term Goals: Week 1:  PT Short Term Goal 1 (Week 1): Pt will perform transfers with consistent min assit PT Short Term Goal 2 (Week 1): Pt will be able to gait x 120' with min assist with LRAD PT Short Term Goal 3 (Week 1): Pt will be able to perform 3 stairs with R rail with min assist  Skilled Therapeutic Interventions/Progress Updates:   Pt received in bed; pt reporting increased fatigue but no c/o pain.  Educated pt on purpose of vestibular evaluation-pt agreeable.  Performed supine > sit supervision and stand pivot to w/c with min A.  See vestibular evaluation below.   At end of session pt left in w/c with quick release belt in place to await recreation therapy.  1. History and Physical Examination    Pt reports acute episode of severe vertigo with inability to remain standing with nausea and vomiting at onset of CVA.  He also reports acute diplopia at onset of CVA.  Pt reports that since coming to the hospital the dizziness, nausea and diplopia have resolved.  He now reports intermittent episodes of "blurriness and wobbling" during gait only.  He denies any other changes in vision or hearing.     2. Vestibular Assessment                           Gross neck ROM WFL  Eye Alignment WFL  Oculomotor ROM WFL  Spontaneous  Nystagmus (room light and vision occluded) None seen in room light  Gaze holding nystagmus(room light and vision occluded) None seen in room light  Smooth pursuit Saccadic  Saccades Extra eye movements, overshooting and undershooting  Vergence WFL  VOR Cancellation WFL  Pressure Tests (vision occluded) N/T  VOR slow WFL  Head Thrust Test + to R  Head Shaking Test (vision occluded) N/T  Dynamic Visual Acuity         2 line difference  Rt. Hallpike  Dix N/T  Lt. Hallpike Dix N/T  Rt. Roll Test N/T   Lt. Roll Test  N/T  MSQ N/T  Cover-Cross Cover (if indicated) WFL  Head-Neck Differentiation Test (if indicated) N/T    3. Findings:  Patient signs and symptoms consistent with impaired visual-vestibular interactions and impaired gaze stability during more dynamic activities including gait.  4. Recommendations for Treatment: Provided pt with handouts to focus on saccades and x 1 viewing in sitting and standing; see handout for specifics.  Therapy Documentation Precautions:  Precautions Precautions: Fall Precaution Comments:   Restrictions Weight Bearing Restrictions: No Vital Signs: Therapy Vitals Pulse Rate: 82 BP: (!) 180/86 Pain: Pain Assessment Pain Assessment: No/denies pain  See Function Navigator for Current Functional Status.   Therapy/Group: Individual Therapy  Raylene Everts [facility] 05/14/2016, 12:34 PM

## 2016-05-14 NOTE — Progress Notes (Signed)
Occupational Therapy Session Note  Patient Details  Name: Richard [name] MRN: [id number] Date of Birth: [birth year]  Today's Date: 05/14/2016 OT Individual Time: 1403-1501 OT Individual Time Calculation (min): 58 min     Short Term Goals: Week 1:  OT Short Term Goal 1 (Week 1): Pt will be able to stand and reach to the floor level to retrieve shoe with min A. OT Short Term Goal 2 (Week 1): Pt will be able to tolerate standing at the sink for 10 min to complete grooming to demonstrate improved activity tolerance. OT Short Term Goal 3 (Week 1): Pt will be able to step over shower stall ledge with min A.  Skilled Therapeutic Interventions/Progress Updates:    Pt completed transfers to the toilet and simulated walk-in shower with pt's wife present as well.  Utilization of the RW was incorporated for safety with pt needing min instructional cueing to stay closer to the walker and stand up tall.  Noted pt with one episode of turning and ending up slightly outside of the walker as well.  LOB to the left with turning on one occasion but pt able to regain balance without therapist intervention.  Pt able to ambulate to and from the ADL apartment with overall supervision.  Pt left in bedside recliner with call button and phone in reach.   Therapy Documentation Precautions:  Precautions Precautions: Fall Precaution Comments:   Restrictions Weight Bearing Restrictions: No  Pain: Pain Assessment Pain Assessment: No/denies pain ADL: See Function Navigator for Current Functional Status.   Therapy/Group: Individual Therapy  Phylis Javed OTR/L 05/14/2016, 4:02 PM

## 2016-05-14 NOTE — Progress Notes (Signed)
Subjective/Complaints: No issues overnite asking about d/c DIdn't have CPAP place last noc, woke up with HA ROS- - CP or SOB, - N/V/D Objective: Vital Signs: Blood pressure (!) 180/86, pulse 82, temperature 97.6 F (36.4 C), temperature source Oral, resp. rate 18, weight 110.5 kg (243 lb 9.7 oz), SpO2 99 %. No results found. Results for orders placed or performed during the hospital encounter of 05/08/16 (from the past 72 hour(s))  Glucose, capillary     Status: Abnormal   Collection Time: 05/11/16 11:43 AM  Result Value Ref Range   Glucose-Capillary 121 (H) 65 - 99 mg/dL  Glucose, capillary     Status: Abnormal   Collection Time: 05/11/16  4:33 PM  Result Value Ref Range   Glucose-Capillary 124 (H) 65 - 99 mg/dL  Glucose, capillary     Status: Abnormal   Collection Time: 05/11/16 10:15 PM  Result Value Ref Range   Glucose-Capillary 106 (H) 65 - 99 mg/dL  Glucose, capillary     Status: Abnormal   Collection Time: 05/12/16  6:54 AM  Result Value Ref Range   Glucose-Capillary 139 (H) 65 - 99 mg/dL  Glucose, capillary     Status: Abnormal   Collection Time: 05/12/16 11:51 AM  Result Value Ref Range   Glucose-Capillary 109 (H) 65 - 99 mg/dL  Glucose, capillary     Status: Abnormal   Collection Time: 05/12/16  5:03 PM  Result Value Ref Range   Glucose-Capillary 149 (H) 65 - 99 mg/dL  Glucose, capillary     Status: Abnormal   Collection Time: 05/12/16  8:56 PM  Result Value Ref Range   Glucose-Capillary 139 (H) 65 - 99 mg/dL   Comment 1 Notify RN   Glucose, capillary     Status: Abnormal   Collection Time: 05/13/16  7:26 AM  Result Value Ref Range   Glucose-Capillary 131 (H) 65 - 99 mg/dL  Glucose, capillary     Status: Abnormal   Collection Time: 05/13/16 11:36 AM  Result Value Ref Range   Glucose-Capillary 176 (H) 65 - 99 mg/dL  Glucose, capillary     Status: Abnormal   Collection Time: 05/13/16  4:31 PM  Result Value Ref Range   Glucose-Capillary 188 (H) 65 - 99  mg/dL  Glucose, capillary     Status: Abnormal   Collection Time: 05/13/16  8:42 PM  Result Value Ref Range   Glucose-Capillary 115 (H) 65 - 99 mg/dL  Glucose, capillary     Status: Abnormal   Collection Time: 05/14/16  6:51 AM  Result Value Ref Range   Glucose-Capillary 136 (H) 65 - 99 mg/dL     HEENT: normal Cardio: RRR Resp: CTA B/L GI: BS positive Extremity:  Pulses positive and No Edema Skin:   Intact and Bruise forearm Neuro: Alert/Oriented and Abnormal Motor 4+ BUE an BLE Musc/Skel:  Normal and Other no pain with UE or LE ROM Gen NAD   Assessment/Plan: 1. 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 Physiatrist is providing close team supervision and 24 hour management of active medical problems listed below. Physiatrist and rehab team continue to assess barriers to discharge/monitor patient progress toward functional and medical goals. FIM: Function - Bathing Position: Wheelchair/chair at sink Body parts bathed by patient: Right arm, Left arm, Chest, Abdomen Body parts bathed by helper: Back Bathing not applicable: Front perineal area, Buttocks, Right upper leg, Left upper  leg, Right lower leg, Left lower leg (did not attempt this session) Assist Level: Touching or steadying assistance(Pt > 75%)  Function- Upper Body Dressing/Undressing What is the patient wearing?: Pull over shirt/dress Pull over shirt/dress - Perfomed by patient: Thread/unthread right sleeve, Thread/unthread left sleeve, Put head through opening, Pull shirt over trunk Assist Level: Touching or steadying assistance(Pt > 75%) Set up : To obtain clothing/put away Function - Lower Body Dressing/Undressing What is the patient wearing?: Pants, Socks, Shoes Position: Wheelchair/chair at sink Underwear - Performed by patient: Thread/unthread right underwear leg, Thread/unthread left  underwear leg, Pull underwear up/down Pants- Performed by patient: Thread/unthread right pants leg, Thread/unthread left pants leg, Pull pants up/down Socks - Performed by patient: Don/doff right sock, Don/doff left sock Shoes - Performed by patient: Don/doff right shoe, Don/doff left shoe, Fasten right, Fasten left Shoes - Performed by helper: Don/doff right shoe, Don/doff left shoe, Fasten right, Fasten left Assist for footwear: Supervision/touching assist Assist for lower body dressing: Touching or steadying assistance (Pt > 75%) Set up : To obtain clothing/put away  Function - Toileting Toileting steps completed by patient: Adjust clothing prior to toileting, Performs perineal hygiene, Adjust clothing after toileting Toileting steps completed by helper: Adjust clothing after toileting Toileting Assistive Devices: Grab bar or rail Assist level: Touching or steadying assistance (Pt.75%)  Function - Toilet Transfers Toilet transfer assistive device: Grab bar, Walker Assist level to toilet: Touching or steadying assistance (Pt > 75%) Assist level from toilet: Touching or steadying assistance (Pt > 75%)  Function - Chair/bed transfer Chair/bed transfer method: Ambulatory Chair/bed transfer assist level: Touching or steadying assistance (Pt > 75%) Chair/bed transfer assistive device: Walker Chair/bed transfer details: Verbal cues for safe use of DME/AE, Verbal cues for technique  Function - Locomotion: Wheelchair Will patient use wheelchair at discharge?: No Type: Manual Max wheelchair distance: 150 Assist Level: Supervision or verbal cues Assist Level: Supervision or verbal cues Wheel 150 feet activity did not occur: Safety/medical concerns (endurance) Assist Level: Supervision or verbal cues Turns around,maneuvers to table,bed, and toilet,negotiates 3% grade,maneuvers on rugs and over doorsills: No Function - Locomotion: Ambulation Assistive device: Walker-rolling Max distance:  250 Assist level: Touching or steadying assistance (Pt > 75%) Assist level: Touching or steadying assistance (Pt > 75%) Assist level: Touching or steadying assistance (Pt > 75%) Walk 150 feet activity did not occur: Safety/medical concerns Assist level: Touching or steadying assistance (Pt > 75%) Walk 10 feet on uneven surfaces activity did not occur: Safety/medical concerns Assist level: Moderate assist (Pt 50 - 74%)  Function - Comprehension Comprehension: Auditory Comprehension assistive device: Hearing aids Comprehension assist level: Understands basic 75 - 89% of the time/ requires cueing 10 - 24% of the time  Function - Expression Expression: Verbal Expression assist level: Expresses basic 75 - 89% of the time/requires cueing 10 - 24% of the time. Needs helper to occlude trach/needs to repeat words.  Function - Social Interaction Social Interaction assist level: Interacts appropriately 90% of the time - Needs monitoring or encouragement for participation or interaction.  Function - Problem Solving Problem solving assist level: Solves basic 75 - 89% of the time/requires cueing 10 - 24% of the time  Function - Memory Memory assist level: Recognizes or recalls 75 - 89% of the time/requires cueing 10 - 24% of the time Patient normally able to recall (first 3 days only): Current season, Location of own room, That he or she is in a hospital Medical Problem List and Plan: 1.  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 His upper extremity appears more ataxic on the right side    Team conference today please see physician documentation under team conference tab, met with team face-to-face to discuss problems,progress, and goals. Formulized individual treatment plan based on medical history, underlying problem and comorbidities.                  2.  DVT Prophylaxis/Anticoagulation: Pharmaceutical:  Xarelto 3. Pain Management: N/A 4. Anxiety disorder/Mood: On Zoloft 100 mg daily as well as valium at bedtime (for years per wife). LCSW to follow for evaluation and support.  5. Neuropsych: This patient is capable of making decisions on his own behalf. 6. Skin/Wound Care: routine pressure relief measures.  7. Fluids/Electrolytes/Nutrition:   Maintain adequate nutrition and hydration status.  Monitor I/O. Marland Kitchen Encourage fluid intake 8.T2DM: Was on metformin 1000 mg bid and actos 30 mg daily--Hgb A1c- 5.7.  Resume actos and monitor BS ac/hs for now. Continue SSI for elevated BS, well controlled CBG (last 3)   Recent Labs  05/13/16 1631 05/13/16 2042 05/14/16 0651  GLUCAP 188* 115* 136*    9. A Fib: Monitor heart rate bid. Continue amiodarone--3 X weekly, metoprolol bid and Xarelto 10. Iron deficiency anemia/B 12 deficiency: Followed by Hem/Onc--Treated with iron infusion on outpatient basis.  Received monthly B 12 supplement today 11. OSA:  Resume CPAP with 2 L oxygen at nights.  12. CHB:  PPM in place. H/o orthostatic symptoms.  13. HTN: Monitor BID--resume Norvasc 2.'5mg'$ , cont current dose expect effect to maximize in 1-2 d Vitals:   05/14/16 0840 05/14/16 0841  BP: (!) 180/86 (!) 180/86  Pulse: 82   Resp:    Temp:      14. Chronic insomnia: Has been off Valium since admission.   15.  Mild hypoK will supplement 16.  Constipation- had BM 9/19, 9/18   LOS (Days) 6 A FACE TO FACE EVALUATION WAS PERFORMED  Richard [name] 05/14/2016, 8:56 AM

## 2016-05-14 NOTE — Progress Notes (Signed)
Physical Therapy Session Note  Patient Details  Name: Richard [name] MRN: [id number] Date of Birth: 1931/02/10  Today's Date: 05/14/2016 PT Individual Time: 1330-1400 PT Individual Time Calculation (min): 30 min   Short Term Goals: Week 1:  PT Short Term Goal 1 (Week 1): Pt will perform transfers with consistent min assit PT Short Term Goal 2 (Week 1): Pt will be able to gait x 120' with min assist with LRAD PT Short Term Goal 3 (Week 1): Pt will be able to perform 3 stairs with R rail with min assist  Skilled Therapeutic Interventions/Progress Updates: gait in room iwht RW to/from toilet and sink, min assist with max VCs for safety due to impulsivity.  Pt tends to leave RW and attempt ambulation without it.  neuromuscular re-education in standing for 10 x 1 mini squats, R/L hip abduction, toe raises.  Seated > standing vision exs for gaze stabilization.  Horizontal head turns did not result in nausea, but pt had increased difficulty stabilizing gaze in standing.  Pt left resting in w/c with quick release belt applied and all needs within reach. Wife present. Falls recovery discussed with pt and wife; PT instructed pt to call 911 and not attempt to help him up.  Pt stated he could get up from floor; plan to try tomorrow. Family ed planned for tomorrow for gait and transfers, car transfer.       Therapy Documentation Precautions:  Precautions Precautions: Fall Precaution Comments:   Restrictions Weight Bearing Restrictions: No   Pain: Pain Assessment Pain Assessment: No/denies pain     See Function Navigator for Current Functional Status.   Therapy/Group: Individual Therapy  [name] 05/14/2016, 4:21 PM

## 2016-05-15 ENCOUNTER — Inpatient Hospital Stay (HOSPITAL_COMMUNITY): Payer: Medicare Other | Admitting: Physical Therapy

## 2016-05-15 ENCOUNTER — Inpatient Hospital Stay (HOSPITAL_COMMUNITY): Payer: Medicare Other | Admitting: Occupational Therapy

## 2016-05-15 ENCOUNTER — Inpatient Hospital Stay (HOSPITAL_COMMUNITY): Payer: Medicare Other

## 2016-05-15 DIAGNOSIS — I10 Essential (primary) hypertension: Secondary | ICD-10-CM

## 2016-05-15 LAB — GLUCOSE, CAPILLARY
GLUCOSE-CAPILLARY: 132 mg/dL — AB (ref 65–99)
GLUCOSE-CAPILLARY: 97 mg/dL (ref 65–99)
GLUCOSE-CAPILLARY: 113 mg/dL — AB (ref 65–99)
GLUCOSE-CAPILLARY: 120 mg/dL — AB (ref 65–99)

## 2016-05-15 MED ORDER — AMLODIPINE BESYLATE 5 MG PO TABS
5.0000 mg | ORAL_TABLET | Freq: Every day | ORAL | Status: DC
  Administered 2016-05-15 – 2016-05-16 (×2): 5 mg via ORAL
  Filled 2016-05-15 (×2): qty 1

## 2016-05-15 NOTE — Progress Notes (Signed)
Physical Therapy Note  Patient Details  Name: Richard [name] MRN: [id number] Date of Birth: 16-May-1931 Today's Date: 05/15/2016  1300-1400, 60 min individual tx Pain: low back, unrated, provided frequent seated rest breaks; declined meds   Family ed with wife for gait on level terrain, carpet, down/up sloping outdoor terrain, up with L rail 8 steps/ down with L rail. Pt chose to turn L and put bil hands on rail when descending. Gait with PT in gift shop for dual task of locating requesting items in shop, with significant extra time and mod cues. Pt unsafe in room with RW ambulating to recliner: in tight spaces, pt hurries, does not remember to turn himself or RW.  He attempts to just leave RW and ambulate without it.  Max cue needed to improve safety, limited by poor insight and hearing loss. Wife observed. PT recommended pt take a nap after lunch qd at home to rest low back. Pt left resting in recliner with quick release belt applied and all needs within reach.  [name] 05/15/2016, 4:21 PM

## 2016-05-15 NOTE — Progress Notes (Signed)
Physical Therapy Session Note  Patient Details  Name: Richard [name] MRN: [id number] Date of Birth: Dec 03, 1930  Today's Date: 05/15/2016 PT Individual Time: [id number] PT Individual Time Calculation (min): 70 min    Short Term Goals: Week 1:  PT Short Term Goal 1 (Week 1): Pt will perform transfers with consistent min assit PT Short Term Goal 2 (Week 1): Pt will be able to gait x 120' with min assist with LRAD PT Short Term Goal 3 (Week 1): Pt will be able to perform 3 stairs with R rail with min assist  Skilled Therapeutic Interventions/Progress Updates:     Patient received sitting in WC finishing breakfast. Patient donned shoes with supervision Assist with min cues for proper shoe positioning.   Gait training in hall for 19ft with supervision A and mod cues for imrpoved foot clearance on the LLE and decreased speed to improve safety.   Dynamic gait training instructed by PT with RW including stepping over 2 inch canes x 2, and to weave through 5 cones x 2, up/down 6inch curb with RW x 2 with supervision A. PT provided mod cues for AD management as well as proper step to gait pattern with stepping over obstacles up curb.   PT educated patient in dynamic balance training with 4 square. Forward/backward stepping without AD x 5, and side stepping without AD x 6 BLE. 4 square in all directions x 4 min-mod A frmo PT to prevent LOB with posterior stepping and lateral stepping to the L.   PT instructed patient in [place] level B exercises with rail in hal for support. PT provided min A and mod cues for proper sequencing and use of BUE support as needed.   Gait training bac to room for 17ft with supervision A and min cues for decreased speed and improved foot clearance with noted improvement in gait following cues form PT.      Therapy Documentation Precautions:  Precautions Precautions: Fall Precaution Comments:   Restrictions Weight Bearing Restrictions: No Vital Signs: Therapy  Vitals Pulse Rate: 69 BP: (!) 168/82 Pain: 0/10     See Function Navigator for Current Functional Status.   Therapy/Group: Individual Therapy  Lorie Phenix 05/15/2016, 9:16 AM

## 2016-05-15 NOTE — Progress Notes (Signed)
PLaced CPAP on patient without complication. RT will continue to monitor as needed.

## 2016-05-15 NOTE — Progress Notes (Signed)
Subjective/Complaints: Has CPAP on this am, no c/os.  Denies pains, we discussed D/C date ROS- - CP or SOB, - N/V/D Objective: Vital Signs: Blood pressure (!) 168/82, pulse 60, temperature 98.1 F (36.7 C), temperature source Oral, resp. rate 16, weight 110.5 kg (243 lb 9.7 oz), SpO2 100 %. No results found. Results for orders placed or performed during the hospital encounter of 05/08/16 (from the past 72 hour(s))  Glucose, capillary     Status: Abnormal   Collection Time: 05/12/16 11:51 AM  Result Value Ref Range   Glucose-Capillary 109 (H) 65 - 99 mg/dL  Glucose, capillary     Status: Abnormal   Collection Time: 05/12/16  5:03 PM  Result Value Ref Range   Glucose-Capillary 149 (H) 65 - 99 mg/dL  Glucose, capillary     Status: Abnormal   Collection Time: 05/12/16  8:56 PM  Result Value Ref Range   Glucose-Capillary 139 (H) 65 - 99 mg/dL   Comment 1 Notify RN   Glucose, capillary     Status: Abnormal   Collection Time: 05/13/16  7:26 AM  Result Value Ref Range   Glucose-Capillary 131 (H) 65 - 99 mg/dL  Glucose, capillary     Status: Abnormal   Collection Time: 05/13/16 11:36 AM  Result Value Ref Range   Glucose-Capillary 176 (H) 65 - 99 mg/dL  Glucose, capillary     Status: Abnormal   Collection Time: 05/13/16  4:31 PM  Result Value Ref Range   Glucose-Capillary 188 (H) 65 - 99 mg/dL  Glucose, capillary     Status: Abnormal   Collection Time: 05/13/16  8:42 PM  Result Value Ref Range   Glucose-Capillary 115 (H) 65 - 99 mg/dL  Glucose, capillary     Status: Abnormal   Collection Time: 05/14/16  6:51 AM  Result Value Ref Range   Glucose-Capillary 136 (H) 65 - 99 mg/dL  Glucose, capillary     Status: Abnormal   Collection Time: 05/14/16 11:27 AM  Result Value Ref Range   Glucose-Capillary 155 (H) 65 - 99 mg/dL  Glucose, capillary     Status: Abnormal   Collection Time: 05/14/16  4:20 PM  Result Value Ref Range   Glucose-Capillary 134 (H) 65 - 99 mg/dL  Glucose,  capillary     Status: Abnormal   Collection Time: 05/14/16  8:35 PM  Result Value Ref Range   Glucose-Capillary 124 (H) 65 - 99 mg/dL  Glucose, capillary     Status: Abnormal   Collection Time: 05/15/16  6:06 AM  Result Value Ref Range   Glucose-Capillary 132 (H) 65 - 99 mg/dL     HEENT: normal Cardio: RRR Resp: CTA B/L GI: BS positive Extremity:  Pulses positive and No Edema Skin:   Intact and Bruise forearm Neuro: Alert/Oriented and Abnormal Motor 4+ BUE an BLE Musc/Skel:  Normal and Other no pain with UE or LE ROM Gen NAD   Assessment/Plan: 1. 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 Physiatrist is providing close team supervision and 24 hour management of active medical problems listed below. Physiatrist and rehab team continue to assess barriers to discharge/monitor patient progress toward functional and medical goals. FIM: Function - Bathing Position: Shower Body parts bathed by patient: Right arm, Left arm, Chest, Abdomen, Front perineal area, Buttocks, Right upper leg, Right lower leg, Left lower leg, Left upper leg Body parts bathed by helper:  Back Bathing not applicable: Front perineal area, Buttocks, Right upper leg, Left upper leg, Right lower leg, Left lower leg (did not attempt this session) Assist Level: Supervision or verbal cues  Function- Upper Body Dressing/Undressing What is the patient wearing?: Pull over shirt/dress Pull over shirt/dress - Perfomed by patient: Thread/unthread right sleeve, Thread/unthread left sleeve, Put head through opening, Pull shirt over trunk Assist Level: Set up Set up : To obtain clothing/put away Function - Lower Body Dressing/Undressing What is the patient wearing?: Pants, Socks, Shoes, Underwear Position: Wheelchair/chair at sink Underwear - Performed by patient: Thread/unthread right underwear leg,  Thread/unthread left underwear leg, Pull underwear up/down Pants- Performed by patient: Thread/unthread right pants leg, Thread/unthread left pants leg, Pull pants up/down Socks - Performed by patient: Don/doff right sock, Don/doff left sock Shoes - Performed by patient: Don/doff right shoe, Don/doff left shoe, Fasten right, Fasten left Shoes - Performed by helper: Don/doff right shoe, Don/doff left shoe, Fasten right, Fasten left Assist for footwear: Supervision/touching assist Assist for lower body dressing: Supervision or verbal cues Set up : To obtain clothing/put away  Function - Toileting Toileting steps completed by patient: Adjust clothing prior to toileting, Performs perineal hygiene, Adjust clothing after toileting Toileting steps completed by helper: Adjust clothing after toileting Toileting Assistive Devices: Grab bar or rail Assist level: Supervision or verbal cues  Function - Toilet Transfers Toilet transfer assistive device: Grab bar, Walker Assist level to toilet: Touching or steadying assistance (Pt > 75%) Assist level from toilet: Touching or steadying assistance (Pt > 75%)  Function - Chair/bed transfer Chair/bed transfer method: Ambulatory Chair/bed transfer assist level: Touching or steadying assistance (Pt > 75%) Chair/bed transfer assistive device: Walker Chair/bed transfer details: Verbal cues for safe use of DME/AE, Verbal cues for technique  Function - Locomotion: Wheelchair Will patient use wheelchair at discharge?: No Type: Manual Max wheelchair distance: 150 Assist Level: Supervision or verbal cues Assist Level: Supervision or verbal cues Wheel 150 feet activity did not occur: Safety/medical concerns (endurance) Assist Level: Supervision or verbal cues Turns around,maneuvers to table,bed, and toilet,negotiates 3% grade,maneuvers on rugs and over doorsills: No Function - Locomotion: Ambulation Assistive device: Walker-rolling Max distance: 250 Assist  level: Touching or steadying assistance (Pt > 75%) Assist level: Touching or steadying assistance (Pt > 75%) Assist level: Touching or steadying assistance (Pt > 75%) Walk 150 feet activity did not occur: Safety/medical concerns Assist level: Touching or steadying assistance (Pt > 75%) Walk 10 feet on uneven surfaces activity did not occur: Safety/medical concerns Assist level: Moderate assist (Pt 50 - 74%)  Function - Comprehension Comprehension: Auditory Comprehension assistive device: Hearing aids Comprehension assist level: Understands basic 75 - 89% of the time/ requires cueing 10 - 24% of the time  Function - Expression Expression: Verbal Expression assist level: Expresses basic 75 - 89% of the time/requires cueing 10 - 24% of the time. Needs helper to occlude trach/needs to repeat words.  Function - Social Interaction Social Interaction assist level: Interacts appropriately 75 - 89% of the time - Needs redirection for appropriate language or to initiate interaction.  Function - Problem Solving Problem solving assist level: Solves basic 50 - 74% of the time/requires cueing 25 - 49% of the time, Solves basic 75 - 89% of the time/requires cueing 10 - 24% of the time  Function - Memory Memory assist level: Recognizes or recalls 75 - 89% of the time/requires cueing 10 - 24% of the time Patient normally able to recall (first 3 days only):  Current season, Location of own room, That he or she is in a hospital Medical Problem List and Plan: 1.  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  DVT Prophylaxis/Anticoagulation: Pharmaceutical: Xarelto 3. Pain Management: N/A 4. Anxiety disorder/Mood: On Zoloft 100 mg daily as well as valium at bedtime (for years per wife). LCSW to follow for evaluation and support.  5. Neuropsych: This patient is capable of making decisions on his own behalf. 6. Skin/Wound Care: routine pressure relief  measures.  7. Fluids/Electrolytes/Nutrition:   Maintain adequate nutrition and hydration status.  Monitor I/O. Marland Kitchen Encourage fluid intake 8.T2DM: Was on metformin 1000 mg bid and actos 30 mg daily--Hgb A1c- 5.7.  Resume actos and monitor BS ac/hs for now. Continue SSI for elevated BS, well controlled CBG (last 3)   Recent Labs  05/14/16 1620 05/14/16 2035 05/15/16 0606  GLUCAP 134* 124* 132*    9. A Fib: Monitor heart rate bid. Continue amiodarone--3 X weekly, metoprolol bid and Xarelto 10. Iron deficiency anemia/B 12 deficiency: Followed by Hem/Onc--Treated with iron infusion on outpatient basis.  Received monthly B 12 supplement today 11. OSA:  Resume CPAP with 2 L oxygen at nights.  12. CHB:  PPM in place. H/o orthostatic symptoms.  13. HTN: Monitor BID--increase Norvasc 5mg  Vitals:   05/14/16 2220 05/15/16 0500  BP:  (!) 168/82  Pulse: 65 60  Resp: 18 16  Temp:  98.1 F (36.7 C)    14. Chronic insomnia: Has been off Valium since admission.   15.  Mild hypoK will supplement 16.  Constipation- improved on Senna 2 po BID  LOS (Days) 7 A FACE TO FACE EVALUATION WAS PERFORMED  Hillman Attig E 05/15/2016, 7:39 AM

## 2016-05-15 NOTE — Progress Notes (Signed)
Occupational Therapy Session Note  Patient Details  Name: Richard [name] MRN: [id number] Date of Birth: 1931/03/15  Today's Date: 05/15/2016 OT Individual Time: [id number] OT Individual Time Calculation (min): 57 min     Short Term Goals: Week 1:  OT Short Term Goal 1 (Week 1): Pt will be able to stand and reach to the floor level to retrieve shoe with min A. OT Short Term Goal 2 (Week 1): Pt will be able to tolerate standing at the sink for 10 min to complete grooming to demonstrate improved activity tolerance. OT Short Term Goal 3 (Week 1): Pt will be able to step over shower stall ledge with min A.  Skilled Therapeutic Interventions/Progress Updates:    Family education with pt's spouse during session.  She was able to ambulate with him to the bathroom for simulated toilet transfer and simulated walk-in shower transfers.  Utilized gait belt for safety with pt needing occasional min instructional cueing to stay closer to the walker and to reach back for the surface when transitioning from standing to sitting.  Worked on Brewing technologist for balance using the [employer] for second part of session.  Utilized Random control program for 4 mins with pt completing with 59% accurracy on the easiest level.  Also performed limits of stability program for 3 intervals on easiest level as well with pt needing increased time and min assist for adequate weight shifts.  Ambulated back to the room at end of session with pt positioned back in the wheelchair with safety belt in place.   Therapy Documentation Precautions:  Precautions Precautions: Fall Precaution Comments:   Restrictions Weight Bearing Restrictions: No  Pain: Pain Assessment Pain Assessment: No/denies pain ADL: ADL ADL Comments: refer to functional navigator Exercises:   Other Treatments:    See Function Navigator for Current Functional Status.   Therapy/Group: Individual Therapy  Raijon Lindfors OTR/L 05/15/2016, 12:54  PM

## 2016-05-16 ENCOUNTER — Ambulatory Visit (HOSPITAL_COMMUNITY): Payer: Medicare Other | Admitting: Physical Therapy

## 2016-05-16 ENCOUNTER — Inpatient Hospital Stay (HOSPITAL_COMMUNITY): Payer: Medicare Other | Admitting: Occupational Therapy

## 2016-05-16 ENCOUNTER — Encounter (HOSPITAL_COMMUNITY): Payer: Self-pay | Admitting: *Deleted

## 2016-05-16 DIAGNOSIS — E1159 Type 2 diabetes mellitus with other circulatory complications: Secondary | ICD-10-CM

## 2016-05-16 DIAGNOSIS — N183 Chronic kidney disease, stage 3 (moderate): Secondary | ICD-10-CM

## 2016-05-16 DIAGNOSIS — N189 Chronic kidney disease, unspecified: Secondary | ICD-10-CM

## 2016-05-16 LAB — BASIC METABOLIC PANEL
Anion gap: 8 (ref 5–15)
BUN: 20 mg/dL (ref 6–20)
CALCIUM: 9.1 mg/dL (ref 8.9–10.3)
CO2: 27 mmol/L (ref 22–32)
CREATININE: 1.36 mg/dL — AB (ref 0.61–1.24)
Chloride: 103 mmol/L (ref 101–111)
GFR calc Af Amer: 54 mL/min — ABNORMAL LOW (ref >60)
GFR calc non Af Amer: 46 mL/min — ABNORMAL LOW (ref >60)
GLUCOSE: 159 mg/dL — AB (ref 65–99)
Potassium: 4.1 mmol/L (ref 3.5–5.1)
Sodium: 138 mmol/L (ref 135–145)

## 2016-05-16 LAB — GLUCOSE, CAPILLARY
Glucose-Capillary: 126 mg/dL — ABNORMAL HIGH (ref 65–99)
Glucose-Capillary: 91 mg/dL (ref 65–99)
Glucose-Capillary: 116 mg/dL — ABNORMAL HIGH (ref 65–99)
Glucose-Capillary: 136 mg/dL — ABNORMAL HIGH (ref 65–99)

## 2016-05-16 MED ORDER — ASPIRIN 81 MG PO TBEC: 81.0000 mg | DELAYED_RELEASE_TABLET | Freq: Every day | ORAL | Status: DC

## 2016-05-16 MED ORDER — SENNOSIDES-DOCUSATE SODIUM 8.6-50 MG PO TABS: 2.0000 | ORAL_TABLET | Freq: Two times a day (BID) | ORAL | 0 refills | Status: DC

## 2016-05-16 MED ORDER — AMLODIPINE BESYLATE 10 MG PO TABS
10.0000 mg | ORAL_TABLET | Freq: Every day | ORAL | Status: DC
  Administered 2016-05-17: 10 mg via ORAL
  Filled 2016-05-16: qty 1

## 2016-05-16 MED ORDER — AMLODIPINE BESYLATE 5 MG PO TABS
5.0000 mg | ORAL_TABLET | Freq: Once | ORAL | Status: AC
  Administered 2016-05-16: 5 mg via ORAL
  Filled 2016-05-16: qty 1

## 2016-05-16 MED ORDER — AMLODIPINE BESYLATE 10 MG PO TABS: 10.0000 mg | ORAL_TABLET | Freq: Every day | ORAL | 0 refills | Status: DC

## 2016-05-16 MED ORDER — AMLODIPINE BESYLATE 5 MG PO TABS: 5.0000 mg | ORAL_TABLET | Freq: Every day | ORAL | 0 refills | Status: DC

## 2016-05-16 MED ORDER — ATORVASTATIN CALCIUM 40 MG PO TABS: 40.0000 mg | ORAL_TABLET | Freq: Every day | ORAL | 0 refills | Status: DC

## 2016-05-16 NOTE — Patient Care Conference (Signed)
Inpatient RehabilitationTeam Conference and Plan of Care Update Date: 05/14/2016   Time: 10:55 AM    Patient Name: Richard [name]      Medical Record Number: [id number]  Date of Birth: 07-25-1931 Sex: Male         Room/Bed: 4W19C/4W19C-01 Payor Info: Payor: MEDICARE / Plan: MEDICARE PART A AND B / Product Type: *No Product type* /    Admitting Diagnosis: CVA  Admit Date/Time:  05/08/2016  6:24 PM Admission Comments: No comment available   Primary Diagnosis:  Ataxia due to recent stroke Principal Problem: Ataxia due to recent stroke  Patient Active Problem List   Diagnosis Date Noted  . CKD (chronic kidney disease)   . Type 2 diabetes mellitus with circulatory disorder, without long-term current use of insulin ([place])   . Acute right PCA stroke ([place]) 05/08/2016  . Ataxia due to recent stroke   . Gait disturbance, post-stroke   . Cerebellar stroke, acute ([place]) 05/07/2016  . Hyperlipidemia   . Chronic anticoagulation   . Cardiac pacemaker in situ   . Acute blood loss anemia   . Cognitive deficits   . Cognitive deficit due to recent stroke   . Cognitive deficit due to recent cerebrovascular accident (CVA)   . Generalized weakness 05/05/2016  . Dizziness   . Absolute anemia 02/08/2016  . Weakness 02/01/2016  . Gastric polyp 06/21/2015  . Anemia, iron deficiency   . GERD (gastroesophageal reflux disease)   . B12 deficiency anemia   . Bradycardia with less than 30 beats per minute 05/31/2014  . Complete heart block ([place]) 05/31/2014  . Syncope 05/31/2014  . Anemia 05/31/2014  . Atrial fibrillation ([place]) 04/08/2013  . Diabetes mellitus ([place]) 04/06/2013  . Hiatal hernia 04/06/2013  . Benign essential HTN 06/13/2011  . OSA (obstructive sleep apnea) 06/13/2011    Expected Discharge Date:  05/17/16  Team Members Present: Physician leading conference: Dr. Alysia Penna Social Worker Present: Alfonse Alpers, LCSW Nurse Present: Heather Twichell, RN PT Present: Georjean Mode,  Rory Percy, PT OT Present: Clyda Greener, OT SLP Present: Windell Moulding, SLP PPS Coordinator present : Daiva Nakayama, RN, CRRN     Current Status/Progress Goal Weekly Team Focus  Medical   min G for ADLs, elevated BP, hx of recurrent falls  home with supervision from wife  BP meds   Bowel/Bladder    Continent of bowel LBM 9/19. Occasional spills of urinal at HS.  At [facility] encourage patient to call for assistance with urinal, and staff hourly rounds to assist with urinal when needed.  Encourage continence of bowel and bladder. Staff to assist patient with assistive device, and educate on proper use of devices.   Swallow/Nutrition/ Hydration             ADL's   supervision for UB selfcare,  min guard to min assist for LB selfcare, min assist for balance with use of the RW for support during transfers to the shower and toilet  supervision overall  selfcare retraining, balance retraining, neuromuscular re-education, pt/family education   Mobility   min assist transfers and gait x 250' and up/down 12 steps, limited by impulsivity  supervision overall  activity tolerance, balance, safety, mobility and locomtion,neuro re-ed   Communication             Safety/Cognition/ Behavioral Observations            Pain   No complaints of pain, or signs of discomfort  Continue to assess patient's pain level q  shift and PRN  Assess patient's pain level/ nonverbal cues of pain PRN   Skin   Skin tear (L) FA with allevyn dressing in place  Assess skin q shift and PRN  Continue to assess skin q shift and PRN    Rehab Goals Patient on target to meet rehab goals: Yes Rehab Goals Revised: none *See Care Plan and progress notes for long and short-term goals.  Barriers to Discharge: balance issues    Possible Resolutions to Barriers:  Cont rehab, HHPT    Discharge Planning/Teaching Needs:  Pt to return to his home with his wife where she will provide 24/7 supervision.  Pt's wife has been present for  therapies and family education.   Team Discussion:  Dr. Letta Pate is adjusting pt's blood pressure medications.  Pt did not have his CPAP last night and RN will f/u on this. PT stated that pt is impulsive and his body moves faster than his feet, with his BERG being 22/56.  Pt is min guard for OT with cueing to slow down and delayed balance reactions.  Getting close to supervision level goals.   Revisions to Treatment Plan:  none   Continued Need for Acute Rehabilitation Level of Care: The patient requires daily medical management by a physician with specialized training in physical medicine and rehabilitation for the following conditions: Daily direction of a multidisciplinary physical rehabilitation program to ensure safe treatment while eliciting the highest outcome that is of practical value to the patient.: Yes Daily medical management of patient stability for increased activity during participation in an intensive rehabilitation regime.: Yes Daily analysis of laboratory values and/or radiology reports with any subsequent need for medication adjustment of medical intervention for : Neurological problems;Blood pressure problems  Jaspreet Bodner, Silvestre Mesi 05/16/2016, 11:02 PM

## 2016-05-16 NOTE — Discharge Summary (Signed)
Physician Discharge Summary  Patient ID: Richard [name] MRN: [id number] DOB/AGE: March 14, 1931 82 y.o.  Admit date: 05/08/2016 Discharge date: 05/17/2016  Discharge Diagnoses:  Principal Problem:   Ataxia due to recent stroke Active Problems:   Cognitive deficit due to recent cerebrovascular accident (CVA)   Gait disturbance, post-stroke   CKD (chronic kidney disease)   Type 2 diabetes mellitus with circulatory disorder, without long-term current use of insulin ([place])   Discharged Condition: Stable.    Labs:  Basic Metabolic Panel: BMP Latest Ref Rng & Units 05/16/2016 05/11/2016 05/09/2016  Glucose 65 - 99 mg/dL 159(H) 148(H) 208(H)  BUN 6 - 20 mg/dL 20 27(H) 17  Creatinine 0.61 - 1.24 mg/dL 1.36(H) 1.33(H) 1.30(H)  Sodium 135 - 145 mmol/L 138 139 139  Potassium 3.5 - 5.1 mmol/L 4.1 3.5 3.4(L)  Chloride 101 - 111 mmol/L 103 102 99(L)  CO2 22 - 32 mmol/L 27 29 29   Calcium 8.9 - 10.3 mg/dL 9.1 8.6(L) 9.4    CBC: CBC Latest Ref Rng & Units 05/09/2016 05/08/2016 05/05/2016  WBC 4.0 - 10.5 K/uL 6.3 4.4 5.2  Hemoglobin 13.0 - 17.0 g/dL 11.4(L) 9.5(L) 10.0(L)  Hematocrit 39.0 - 52.0 % 37.1(L) 30.8(L) 33.2(L)  Platelets 150 - 400 K/uL 207 128(L) 145(L)    CBG:  Recent Labs Lab 05/16/16 0648 05/16/16 1154 05/16/16 1629 05/16/16 2056 05/17/16 0657  GLUCAP 126* 91 116* 136* 140*    Brief HPI:   Richard Lavender Robertsis a 82 y.o.malewith history of A fib-on xarelto, B 12 deficiency, T2DM, HTN, OSA, CHB s/p PPM, cognitive impairements who was admitted on 05/05/16 with complaints of weakness, vertigo and double vision. CT head negative for acute changes. CTA head/neck done revealing no occlusions, dissection or significant stenosis. Patient with nystagmus and RUE/RLE dysmetria felt to be due to small cerebellar stroke. Neurology recommended adding low dose ASA secondary to posterior circulation infarct likely due to SVD and continuing Xarelto. Speech therapy evaluation revealed baseline  dysarthria and severe cognitive deficits. Therapy ongoing and patient with balance deficits , dizziness and nystagmus with head turn. CIR recommended for follow up therapy   Hospital Course: Richard [name] was admitted to rehab 05/08/2016 for inpatient therapies to consist of PT and OT at least three hours five days a week. Past admission physiatrist, therapy team and rehab RN have worked together to provide customized collaborative inpatient rehab. Blood pressures have been monitored bid and were noted to trend upwards. Norvasc has been titrated to 10 mg and he will need further adjustments after discharge. Renal status has been monitored serially and he was noted to get dehydrated. He was encouraged to increase fluid intake with resolution of pre-renal azotemia. SCr is at baseline. Diabetes has been monitored with ac/hs cbg checks and Actos was resumed. Po intake has been good and BS controlled on actos alone. He has had issues with constipation and senna was added with good results. Mood has been stable on zoloft. He has made good progress during his stay but continues to be impulsive and supervision is recommended due to balance deficits and poor safety.  He will continue to receive follow up HHPT,HHOT and HHRN by [facility] after discharge.    Rehab course: During patient's stay in rehab weekly team conferences were held to monitor patient's progress, set goals and discuss barriers to discharge. He declined participation in formal cognitive assessment and felt that his dysarthria was at baseline. He required min assist with basic self care tasks and min  to moderate assist with mobility. He has had improvement in activity tolerance, balance, postural control, as well as ability to compensate for deficits.  He is able to complete ADL tasks with supervision. He is able to ambulate 69' with RW and supervision. He continues to require max cues for safety due to poor safety awareness, to decrease  tendency to rush and for safe RW use.    Disposition: 01-Home or Self Care  Diet: Heart Healthy/Carb Modified.   Special Instructions: 1. No driving.  2. Needs 24 hours supervision.     Discharge Instructions    Ambulatory referral to Physical Medicine Rehab    Complete by:  As directed    1-2 week follow up        Medication List    STOP taking these medications   benazepril 40 MG tablet Commonly known as:  LOTENSIN   diazepam 5 MG tablet Commonly known as:  VALIUM   metFORMIN 1000 MG tablet Commonly known as:  GLUCOPHAGE     TAKE these medications   acetaminophen 500 MG tablet Commonly known as:  TYLENOL Take 500 mg by mouth 2 (two) times daily.   amiodarone 200 MG tablet Commonly known as:  PACERONE Take 200 mg by mouth 3 (three) times a week. On Monday, Wednesday & Friday   amLODipine 10 MG tablet Commonly known as:  NORVASC Take 1 tablet (10 mg total) by mouth daily. What changed:  medication strength  how much to take   aspirin 81 MG EC tablet Take 1 tablet (81 mg total) by mouth daily.   atorvastatin 40 MG tablet Commonly known as:  LIPITOR Take 1 tablet (40 mg total) by mouth daily at 6 PM.   cyanocobalamin 1000 MCG/ML injection Commonly known as:  (VITAMIN B-12) Inject 1,000 mcg into the muscle every 30 (thirty) days.   fluticasone 50 MCG/ACT nasal spray Commonly known as:  FLONASE Place 1 spray into the nose daily as needed for allergies or rhinitis.   metoprolol 50 MG tablet Commonly known as:  LOPRESSOR TAKE 1 TABLET BY MOUTH TWICE A DAY   omeprazole 20 MG capsule Commonly known as:  PRILOSEC Take 20 mg by mouth daily.   OXYGEN Inhale into the lungs at bedtime. CPAP with OXYGEN   pioglitazone 30 MG tablet Commonly known as:  ACTOS Take 30 mg by mouth daily.   senna-docusate 8.6-50 MG tablet Commonly known as:  Senokot-S Take 2 tablets by mouth 2 (two) times daily. For constipation   sertraline 100 MG tablet Commonly  known as:  ZOLOFT Take 1 tablet by mouth daily.   XARELTO 20 MG Tabs tablet Generic drug:  rivaroxaban TAKE 1 TABLET (20 MG TOTAL) BY MOUTH DAILY WITH SUPPER.       Follow-up Information    [name], MD .   Specialty:  Physical Medicine and Rehabilitation Why:  office will call you with follow up appointment Contact information: [place] [address] [phone]        Xu,Jindong, MD. Call today.   Specialty:  Neurology Why:  for follow up appointment. Contact information: [address] [place] [address] [phone]        Tivis Ringer, MD .   Specialty:  Internal Medicine Why:  Amy from the office will call you on Monday to check on you.  She will give you a hospital follow up visit then. Contact information: [address] [place] [address] [phone]  SignedBary Leriche 05/19/2016, 11:39 AM

## 2016-05-16 NOTE — Progress Notes (Signed)
Subjective/Complaints: Pt seen laying in bed this AM.  He slept well overnight and is looking forward to going home tomorrow.   ROS- Denies CP or SOB, N/V/D Objective: Vital Signs: Blood pressure (!) 160/72, pulse 76, temperature 98.1 F (36.7 C), temperature source Oral, resp. rate 18, weight 109.5 kg (241 lb 6.5 oz), SpO2 97 %. No results found. Results for orders placed or performed during the hospital encounter of 05/08/16 (from the past 72 hour(s))  Glucose, capillary     Status: Abnormal   Collection Time: 05/13/16 11:36 AM  Result Value Ref Range   Glucose-Capillary 176 (H) 65 - 99 mg/dL  Glucose, capillary     Status: Abnormal   Collection Time: 05/13/16  4:31 PM  Result Value Ref Range   Glucose-Capillary 188 (H) 65 - 99 mg/dL  Glucose, capillary     Status: Abnormal   Collection Time: 05/13/16  8:42 PM  Result Value Ref Range   Glucose-Capillary 115 (H) 65 - 99 mg/dL  Glucose, capillary     Status: Abnormal   Collection Time: 05/14/16  6:51 AM  Result Value Ref Range   Glucose-Capillary 136 (H) 65 - 99 mg/dL  Glucose, capillary     Status: Abnormal   Collection Time: 05/14/16 11:27 AM  Result Value Ref Range   Glucose-Capillary 155 (H) 65 - 99 mg/dL  Glucose, capillary     Status: Abnormal   Collection Time: 05/14/16  4:20 PM  Result Value Ref Range   Glucose-Capillary 134 (H) 65 - 99 mg/dL  Glucose, capillary     Status: Abnormal   Collection Time: 05/14/16  8:35 PM  Result Value Ref Range   Glucose-Capillary 124 (H) 65 - 99 mg/dL  Glucose, capillary     Status: Abnormal   Collection Time: 05/15/16  6:06 AM  Result Value Ref Range   Glucose-Capillary 132 (H) 65 - 99 mg/dL  Glucose, capillary     Status: None   Collection Time: 05/15/16 11:59 AM  Result Value Ref Range   Glucose-Capillary 97 65 - 99 mg/dL  Glucose, capillary     Status: Abnormal   Collection Time: 05/15/16  4:38 PM  Result Value Ref Range   Glucose-Capillary 113 (H) 65 - 99 mg/dL  Glucose,  capillary     Status: Abnormal   Collection Time: 05/15/16  8:48 PM  Result Value Ref Range   Glucose-Capillary 120 (H) 65 - 99 mg/dL  Glucose, capillary     Status: Abnormal   Collection Time: 05/16/16  6:48 AM  Result Value Ref Range   Glucose-Capillary 126 (H) 65 - 99 mg/dL     HEENT: Normocephalic, atraumatic Cardio: RRR Resp: CTA B/L GI: BS positive, soft Skin:   Intact and Bruises forearm. Warm and dry Neuro: Alert/Oriented  Motor 4+ BUE an BLE Very minimal ataxia R>L Musc/Skel:  No edema.  No tenderness Gen NAD. Vital signs reviewed.   Assessment/Plan: 1. 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 Physiatrist is providing close team supervision and 24 hour management of active medical problems listed below. Physiatrist and rehab team continue to assess barriers to discharge/monitor patient progress toward functional and medical goals. FIM: Function - Bathing Position: Shower Body parts bathed by patient: Right arm, Left arm, Chest, Abdomen, Front perineal area, Buttocks, Right upper leg, Right lower leg, Left lower leg, Left upper leg Body parts bathed by helper: Back Bathing not  applicable: Front perineal area, Buttocks, Right upper leg, Left upper leg, Right lower leg, Left lower leg (did not attempt this session) Assist Level: Supervision or verbal cues  Function- Upper Body Dressing/Undressing What is the patient wearing?: Pull over shirt/dress Pull over shirt/dress - Perfomed by patient: Thread/unthread right sleeve, Thread/unthread left sleeve, Put head through opening, Pull shirt over trunk Assist Level: Set up Set up : To obtain clothing/put away Function - Lower Body Dressing/Undressing What is the patient wearing?: Pants, Socks, Shoes, Underwear Position: Wheelchair/chair at sink Underwear - Performed by patient: Thread/unthread right  underwear leg, Thread/unthread left underwear leg, Pull underwear up/down Pants- Performed by patient: Thread/unthread right pants leg, Thread/unthread left pants leg, Pull pants up/down Socks - Performed by patient: Don/doff right sock, Don/doff left sock Shoes - Performed by patient: Don/doff right shoe, Don/doff left shoe, Fasten right, Fasten left Shoes - Performed by helper: Don/doff right shoe, Don/doff left shoe, Fasten right, Fasten left Assist for footwear: Supervision/touching assist Assist for lower body dressing: Supervision or verbal cues Set up : To obtain clothing/put away  Function - Toileting Toileting steps completed by patient: Adjust clothing prior to toileting, Performs perineal hygiene, Adjust clothing after toileting Toileting steps completed by helper: Adjust clothing prior to toileting [place]: Grab bar or rail Assist level: Supervision or verbal cues  Function - Toilet Transfers Toilet transfer assistive device: Grab bar, Walker Assist level to toilet: Touching or steadying assistance (Pt > 75%) Assist level from toilet: Touching or steadying assistance (Pt > 75%)  Function - Chair/bed transfer Chair/bed transfer method: Ambulatory Chair/bed transfer assist level: Supervision or verbal cues Chair/bed transfer assistive device: Armrests, Walker Chair/bed transfer details: Verbal cues for safe use of DME/AE, Verbal cues for technique  Function - Locomotion: Wheelchair Will patient use wheelchair at discharge?: No Type: Manual Max wheelchair distance: 150 Assist Level: Supervision or verbal cues Assist Level: Supervision or verbal cues Wheel 150 feet activity did not occur: Safety/medical concerns (endurance) Assist Level: Supervision or verbal cues Turns around,maneuvers to table,bed, and toilet,negotiates 3% grade,maneuvers on rugs and over doorsills: No Function - Locomotion: Ambulation Assistive device: Walker-rolling Max distance:  149ft  Assist level: Supervision or verbal cues Assist level: Supervision or verbal cues Assist level: Supervision or verbal cues Walk 150 feet activity did not occur: Safety/medical concerns Assist level: Supervision or verbal cues Walk 10 feet on uneven surfaces activity did not occur: Safety/medical concerns Assist level: Touching or steadying assistance (Pt > 75%)  Function - Comprehension Comprehension: Auditory Comprehension assistive device: Hearing aids Comprehension assist level: Understands basic 75 - 89% of the time/ requires cueing 10 - 24% of the time  Function - Expression Expression: Verbal Expression assist level: Expresses basic 75 - 89% of the time/requires cueing 10 - 24% of the time. Needs helper to occlude trach/needs to repeat words.  Function - Social Interaction Social Interaction assist level: Interacts appropriately 75 - 89% of the time - Needs redirection for appropriate language or to initiate interaction.  Function - Problem Solving Problem solving assist level: Solves basic 75 - 89% of the time/requires cueing 10 - 24% of the time  Function - Memory Memory assist level: Recognizes or recalls 25 - 49% of the time/requires cueing 50 - 75% of the time Patient normally able to recall (first 3 days only): Current season, Location of own room, Staff names and faces  Medical Problem List and Plan: 1.  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  DVT Prophylaxis/Anticoagulation: Pharmaceutical: Xarelto 3. Pain Management: N/A 4. Anxiety disorder/Mood: On Zoloft 100 mg daily as well as valium at bedtime (for years per wife). LCSW to follow for evaluation and support.  5. Neuropsych: This patient is capable of making decisions on his own behalf. 6. Skin/Wound Care: routine pressure relief measures.  7. Fluids/Electrolytes/Nutrition:   Maintain adequate nutrition and hydration status.  Monitor I/O. Marland Kitchen Encourage fluid  intake 8.T2DM: Was on metformin 1000 mg bid and actos 30 mg daily--Hgb A1c- 5.7.  Resume actos and monitor BS ac/hs for now. Continue SSI for elevated BS, relatively controlled CBG (last 3)   Recent Labs  05/15/16 1638 05/15/16 2048 05/16/16 0648  GLUCAP 113* 120* 126*    9. A Fib: Monitor heart rate bid. Continue amiodarone--3 X weekly, metoprolol bid and Xarelto 10. Iron deficiency anemia/B 12 deficiency: Followed by Hem/Onc--Treated with iron infusion on outpatient basis.  Receiving monthly B 12 supplement  11. OSA:  Resume CPAP with 2 L oxygen at nights.  12. CHB:  PPM in place. H/o orthostatic symptoms.  13. HTN: Monitor BID--Norvasc increased to 10mg  on 9/22 Vitals:   05/16/16 0528 05/16/16 0825  BP: (!) 200/87 (!) 160/72  Pulse: 68 76  Resp: 18 18  Temp: 97.5 F (36.4 C) 98.1 F (36.7 C)   Will cont to monitor  Will need close follow up as outpt for further adjustments  14. Chronic insomnia: Has been off Valium since admission.   15.  Mild hypoK will supplement 16.  Constipation- improved on Senna 2 po BID  LOS (Days) 8 A FACE TO FACE EVALUATION WAS PERFORMED  Ankit Lorie Phenix 05/16/2016, 9:29 AM

## 2016-05-16 NOTE — Progress Notes (Signed)
Social Work Discharge Note  The overall goal for the admission was met for:   Discharge location: Yes - home  Length of Stay: Yes -9 days   Discharge activity level: Yes - supervision  Home/community participation: Yes  Services provided included: MD, RD, PT, OT, SLP, RN, Pharmacy and SW  Financial Services: Medicare and Private Insurance: [employer]  Follow-up services arranged: Home Health: PT/OT from [facility], DME: rolling walker from [facility] and Patient/Family has no preference for HH/DME agencies  Comments (or additional information):  Pt to return to his home with his wife who will provide 24/7 supervision.  She has received family education and feels prepared to care for pt at home. Pt agreed to home health.  Patient/Family verbalized understanding of follow-up arrangements: Yes  Individual responsible for coordination of the follow-up plan: pt's wife  Confirmed correct DME delivered: ,  [facility] 05/16/2016    ,  [facility] 

## 2016-05-16 NOTE — Progress Notes (Signed)
Social Work Patient ID: Richard [name], male   DOB: [birth year], 82 y.o.   MRN: [id number]   Richard Child, LCSW Social Worker Signed   Patient Care Conference Date of Service: 05/16/2016 11:02 PM      Hide copied text Hover for attribution information Inpatient RehabilitationTeam Conference and Plan of Care Update Date: 05/14/2016   Time: 10:55 AM      Patient Name: Richard [name]      Medical Record Number: [id number]  Date of Birth: 04-23-1931 Sex: Male         Room/Bed: 4W19C/4W19C-01 Payor Info: Payor: MEDICARE / Plan: MEDICARE PART A AND B / Product Type: *No Product type* /     Admitting Diagnosis: CVA  Admit Date/Time:  05/08/2016  6:24 PM Admission Comments: No comment available    Primary Diagnosis:  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 CKD (chronic kidney disease)    . Type 2 diabetes mellitus with circulatory disorder, without long-term current use of insulin ([place])    . Acute right PCA stroke ([place]) 05/08/2016  . Ataxia due to recent stroke    . Gait disturbance, post-stroke    . Cerebellar stroke, acute ([place]) 05/07/2016  . Hyperlipidemia    . Chronic anticoagulation    . Cardiac pacemaker in situ    . Acute blood loss anemia    . Cognitive deficits    . Cognitive deficit due to recent stroke    . Cognitive deficit due to recent cerebrovascular accident (CVA)    . Generalized weakness 05/05/2016  . Dizziness    . Absolute anemia 02/08/2016  . Weakness 02/01/2016  . Gastric polyp 06/21/2015  . Anemia, iron deficiency    . GERD (gastroesophageal reflux disease)    . B12 deficiency anemia    . Bradycardia with less than 30 beats per minute 05/31/2014  . Complete heart block ([place]) 05/31/2014  . Syncope 05/31/2014  . Anemia 05/31/2014  . Atrial fibrillation ([place]) 04/08/2013  . Diabetes mellitus ([place]) 04/06/2013  . Hiatal hernia 04/06/2013  . Benign essential  HTN 06/13/2011  . OSA (obstructive sleep apnea) 06/13/2011      Expected Discharge Date:  05/17/16   Team Members Present: Physician leading conference: Dr. Alysia [name] Social Worker Present: Richard Alpers, LCSW Nurse Present: Richard Scaduto, RN PT Present: Richard [name], Richard [name], PT OT Present: Richard [name], OT SLP Present: Richard [name], SLP PPS Coordinator present : Richard Nakayama, RN, CRRN       Current Status/Progress Goal Weekly Team Focus  Medical   min G for ADLs, elevated BP, hx of recurrent falls  home with supervision from wife  BP meds   Bowel/Bladder    Continent of bowel LBM 9/19. Occasional spills of urinal at HS.  At [facility] encourage patient to call for assistance with urinal, and staff hourly rounds to assist with urinal when needed.  Encourage continence of bowel and bladder. Staff to assist patient with assistive device, and educate on proper use of devices.   Swallow/Nutrition/ Hydration             ADL's   supervision for UB selfcare,  min guard to min assist for LB selfcare, min assist for balance with use of the RW for support during transfers to the shower and toilet  supervision overall  selfcare retraining, balance retraining, neuromuscular re-education, pt/family  education   Mobility   min assist transfers and gait x 250' and up/down 12 steps, limited by impulsivity  supervision overall  activity tolerance, balance, safety, mobility and locomtion,neuro re-ed   Communication             Safety/Cognition/ Behavioral Observations           Pain   No complaints of pain, or signs of discomfort  Continue to assess patient's pain level q shift and PRN  Assess patient's pain level/ nonverbal cues of pain PRN   Skin   Skin tear (L) FA with allevyn dressing in place  Assess skin q shift and PRN  Continue to assess skin q shift and PRN     Rehab Goals Patient on target to meet rehab goals: Yes Rehab Goals Revised: none *See Care Plan and progress notes for long  and short-term goals.   Barriers to Discharge: balance issues   Possible Resolutions to Barriers:  Cont rehab, HHPT   Discharge Planning/Teaching Needs:  Pt to return to his home with his wife where she will provide 24/7 supervision.  Pt's wife has been present for therapies and family education.   Team Discussion:  Richard [name] is adjusting pt's blood pressure medications.  Pt did not have his CPAP last night and RN will f/u on this. PT stated that pt is impulsive and his body moves faster than his feet, with his BERG being 22/56.  Pt is min guard for OT with cueing to slow down and delayed balance reactions.  Getting close to supervision level goals.   Revisions to Treatment Plan:  none    Continued Need for Acute Rehabilitation Level of Care: The patient requires daily medical management by a physician with specialized training in physical medicine and rehabilitation for the following conditions: Daily direction of a multidisciplinary physical rehabilitation program to ensure safe treatment while eliciting the highest outcome that is of practical value to the patient.: Yes Daily medical management of patient stability for increased activity during participation in an intensive rehabilitation regime.: Yes Daily analysis of laboratory values and/or radiology reports with any subsequent need for medication adjustment of medical intervention for : Neurological problems;Blood pressure problems   Richard [name], Richard [name] 05/16/2016, 11:02 PM

## 2016-05-16 NOTE — Progress Notes (Signed)
Occupational Therapy Session Note  Patient Details  Name: Richard [name] MRN: [id number] Date of Birth: 05-Sep-1930  Today's Date: 05/16/2016 OT Individual Time: [id number] OT Individual Time Calculation (min): 75 min     Short Term Goals: Week 1:  OT Short Term Goal 1 (Week 1): Pt will be able to stand and reach to the floor level to retrieve shoe with min A. OT Short Term Goal 2 (Week 1): Pt will be able to tolerate standing at the sink for 10 min to complete grooming to demonstrate improved activity tolerance. OT Short Term Goal 3 (Week 1): Pt will be able to step over shower stall ledge with min A.  Skilled Therapeutic Interventions/Progress Updates:    Pt completed eating breakfast to start session sitting unsupported EOB with modified independence.  He then proceeded to complete bathing and dressing tasks, with supervision for ambulation to the bathroom with the RW.  He stood at the toilet to urinate with close supervisions before transitioning to the tub bench for bathing.  Supervision for all bathing as well as for dressing sit to stand at the EOB.  Pt needs mod instructional cueing for safe walker usage as he tends to want to push it too far in front of him as well as getting outside of the walker during turns.  Last part of session focused on standing balance with use of the Dynavision.  Pt needed use of UE for support against the Dynavision to maintain balance while performing dynamic reaching.  Pt completed several intervals using both the RUE and LUE.  Reaction times were 2-3 seconds on first interval and by end of session they had decreased to just over 1.6 seconds.  Ambulated back to room where wife was present and pt sat in wheelchair with call button and safety belt in place.  Therapy Documentation Precautions:  Precautions Precautions: Fall Precaution Comments:   Restrictions Weight Bearing Restrictions: No  Pain: Pain Assessment Pain Assessment: 0-10 Pain Score: 0-No  pain ADL: See Function Navigator for Current Functional Status.   Therapy/Group: Individual Therapy  Anneli Bing OTR/L 05/16/2016, 11:33 AM

## 2016-05-16 NOTE — Progress Notes (Addendum)
Occupational Therapy Discharge Summary  Patient Details  Name: Richard [name] MRN: [id number] Date of Birth: March 02, 1931  Today's Date: 05/16/2016 OT Individual Time: 1300-1357 OT Individual Time Calculation (min): 57 min   Session Note:  Pt ambulated to the OT therapy gym with use of the RW and supervision.  Min instructional cueing for hand placement with transfers and to not leave his hands on the walker when attempting to sit but instead to reach back for the therapy mat to sit.  Worked on dynamic balance in standing with ball toss and catch.  Pt with 2 losses of balance forward and to the left when reaching for the ball.  Utilized foam mat for unstable surface to stand on as well.  Pt with frequent LOB standing on mat statically, needing mod assist to maintain balance for simple head turns and reaching to targets.  Increased forward lean with LOB most of the time.  Had pt perform short distance ambulation without assistive device with min assist before ambulating back to the room at the end of the session with supervision using the RW.  Pt left in wheelchair with pt's spouse present and call button in lap.    Patient has met 10 of 10 long term goals due to improved balance and postural control.  Patient to discharge at overall Supervision level.  Patient's care partner is independent to provide the necessary physical and cognitive assistance at discharge.    Reasons goals not met: NA  Recommendation:  Patient will benefit from ongoing skilled OT services in home health setting to continue to advance functional skills in the area of BADL and Reduce care partner burden.  Pt still demonstrates increased fall risk and decreased awareness of current balance deficits.  Feel he needs 24 hour supervision for safety as well as continued HHOT at discharge to further progress ADL function to modified independent level.  Equipment: RW  Reasons for discharge: treatment goals met and discharge from  hospital  Patient/family agrees with progress made and goals achieved: Yes  OT Discharge Precautions/Restrictions Precautions Precautions: Fall Restrictions Weight Bearing Restrictions: No  Pain  No report of pain  ADL ADL ADL Comments: refer to functional navigator Vision/Perception  Vision- History Baseline Vision/History: Wears glasses (But he doesn't really wear them per his report as he cannot see out of them) Vision- Assessment Vision Assessment?: Yes Eye Alignment: Within Functional Limits Ocular Range of Motion: Restricted looking down Alignment/Gaze Preference: Within Defined Limits Tracking/Visual Pursuits: Decreased smoothness of horizontal tracking;Decreased smoothness of vertical tracking;Other (comment) (Pt with lost fixation in superior lateral field of the left side) Saccades: Additional eye shifts occurred during testing;Additional head turns occurred during testing;Other (comment) (Decreased ability to find objects in lower visual field) Convergence: Within functional limits Visual Fields: No apparent deficits  Cognition Overall Cognitive Status: History of cognitive impairments - at baseline Arousal/Alertness: Awake/alert Orientation Level: Oriented X4 Attention: Sustained;Selective Sustained Attention: Appears intact Selective Attention: Appears intact Selective Attention Impairment: Functional basic Memory: Impaired Memory Impairment: Decreased recall of new information;Storage deficit Awareness: Impaired Awareness Impairment: Emergent impairment;Anticipatory impairment Problem Solving: Impaired Safety/Judgment: Impaired Comments: Pt still demonstrates decreased awareness of safety and balance deficits, needs 24 hour supervision for safety.  Sensation Sensation Light Touch: Appears Intact Stereognosis: Appears Intact Hot/Cold: Appears Intact Proprioception: Appears Intact Coordination Gross Motor Movements are Fluid and Coordinated: Yes Fine  Motor Movements are Fluid and Coordinated: Yes Motor  Motor Motor: Abnormal postural alignment and control Motor - Skilled Clinical Observations: mild Ataxia  in LE Mobility  Bed Mobility Bed Mobility: Rolling Right;Rolling Left;Sit to Supine;Supine to Sit Rolling Right: 6: Modified independent (Device/Increase time) Rolling Left: 6: Modified independent (Device/Increase time) Supine to Sit: 5: Supervision Sit to Supine: 5: Supervision Transfers Sit to Stand: 5: Supervision Sit to Stand Details: Verbal cues for precautions/safety;Verbal cues for technique  Trunk/Postural Assessment  Cervical Assessment Cervical Assessment: Exceptions to [facility] (cervical flexion and protraction) Thoracic Assessment Thoracic Assessment: Exceptions to [facility] (thoracic kyphosis present ) Lumbar Assessment Lumbar Assessment: Exceptions to [facility] (increased lumbar flexion at rest) Postural Control Postural Control:  (Delayed balance reactions in standing. )  Balance Static Standing Balance Static Standing - Balance Support: No upper extremity supported Static Standing - Level of Assistance: 5: Stand by assistance Dynamic Standing Balance Dynamic Standing - Balance Support: Bilateral upper extremity supported Dynamic Standing - Level of Assistance: 5: Stand by assistance Extremity/Trunk Assessment RUE Assessment RUE Assessment: Within Functional Limits (strength 4/5 throughou) LUE Assessment LUE Assessment: Within Functional Limits (strength 4/5 throughout)   See Function Navigator for Current Functional Status.  Taven Strite OTR/L 05/16/2016, 1:48 PM

## 2016-05-16 NOTE — Progress Notes (Signed)
Physical Therapy Discharge Summary  Patient Details  Name: Richard [name] MRN: [id number] Date of Birth: 01-14-1931  Today's Date: 05/16/2016 PT Individual Time: 1100-1200 PT individual time Calculation: 60 min    Patient has met 7 of 7 long term goals due to improved activity tolerance, improved balance, improved postural control, increased strength, increased range of motion, decreased pain, improved attention and improved coordination.  Patient to discharge at an ambulatory level Supervision.   Patient's care partner is independent to provide the necessary physical and cognitive assistance at discharge.    Recommendation:  Patient will benefit from ongoing skilled PT services in home health setting to continue to advance safe functional mobility, address ongoing impairments in balance, strength, coordination, ataxia, and minimize fall risk.  Equipment: RW  Reasons for discharge: treatment goals met and discharge from hospital  Patient/family agrees with progress made and goals achieved: Yes   TREATMENT:  PT instructed patient in Grad day assessment; See below for results. PT also educated patient and wife on safe guarding techniques for gait, car transfer, and bed transfers with mod cues for proper cues to family member for how to educate patient in safe movements and AD management. Car transfer performed with stand pivot transfer as listed below.   Patient left sitting in [facility] with call bell in reach at end of treatment.    PT Discharge Precautions/RestrictionsPrecautions Precautions: Fall Restrictions Weight Bearing Restrictions: No Vital Signs Therapy Vitals Temp: 34 F (36.7 C) Temp Source: Oral Pulse Rate: 60 Resp: 18 BP: (!) 129/55 Patient Position (if appropriate): Sitting Oxygen Therapy SpO2: 98 % O2 Device: Not Delivered     Cognition Overall Cognitive Status: History of cognitive impairments - at baseline Orientation Level: Oriented X4 Sustained  Attention: Appears intact Selective Attention: Appears intact Memory: Impaired Memory Impairment: Decreased recall of new information Awareness: Impaired Problem Solving: Impaired Safety/Judgment: Impaired Sensation Sensation Light Touch: Appears Intact Stereognosis: Appears Intact Hot/Cold: Appears Intact Proprioception: Appears Intact Coordination Gross Motor Movements are Fluid and Coordinated: Yes Fine Motor Movements are Fluid and Coordinated: Yes Motor  Motor Motor: Ataxia Motor - Skilled Clinical Observations: mild Ataxia in LE  Mobility Bed Mobility Bed Mobility: Rolling Right;Rolling Left;Sit to Supine;Supine to Sit Rolling Right: 6: Modified independent (Device/Increase time) Rolling Left: 6: Modified independent (Device/Increase time) Supine to Sit: 5: Supervision Sit to Supine: 5: Supervision Transfers Transfers: Yes Sit to Stand: 5: Supervision Sit to Stand Details: Verbal cues for precautions/safety;Verbal cues for technique Stand Pivot Transfers: 5: Supervision Stand Pivot Transfer Details: Verbal cues for technique;Verbal cues for precautions/safety Locomotion  Ambulation Ambulation: Yes Ambulation/Gait Assistance: 5: Supervision Assistive device: Rolling walker Ambulation/Gait Assistance Details: Verbal cues for safe use of DME/AE;Verbal cues for precautions/safety;Verbal cues for gait pattern Gait Gait: No Gait Pattern: Impaired Gait Pattern: Step-through pattern;Ataxic;Decreased step length - left Stairs / Additional Locomotion Stairs: Yes Stairs Assistance: 5: Supervision Stair Management Technique: Two rails Height of Stairs: 6 Wheelchair Mobility Wheelchair Mobility: No  Trunk/Postural Assessment  Cervical Assessment Cervical Assessment: Within Functional Limits (forward head) Thoracic Assessment Thoracic Assessment:  (flexed posture) Lumbar Assessment Lumbar Assessment: Exceptions to [facility] Postural Control Postural Control:  (decreased  correction for LOB in standing. )  Balance Static Sitting Balance Static Sitting - Level of Assistance: 6: Modified independent (Device/Increase time) Dynamic Sitting Balance Dynamic Sitting - Level of Assistance: 6: Modified independent (Device/Increase time) Static Standing Balance Static Standing - Balance Support: No upper extremity supported Static Standing - Level of Assistance: 5: Stand by assistance  Dynamic Standing Balance Dynamic Standing - Balance Support: Bilateral upper extremity supported Dynamic Standing - Level of Assistance: 5: Stand by assistance Extremity Assessment      RLE Assessment RLE Assessment:  (grossly 4+/5; ) LLE Assessment LLE Assessment:  (grossly 4+/5)   See Function Navigator for Current Functional Status.  Lorie Phenix 05/16/2016, 6:11 PM

## 2016-05-16 NOTE — Progress Notes (Signed)
Social Work Patient ID: Richard [name], male   DOB: [birth year], 82 y.o.   MRN: [id number]   CSW met with pt and his wife to update them on team conference discussion and targeted d/c date of 05-17-16.  Pt is ready to go home and wife feels she can care for pt at home.  CSW told pt of recommendation for home health therapies and he was unsure he wanted the service.  CSW talked with pt and wife again the next day and he agreed to try it.  Pt's wife seemed relieved that pt accepted service.  CSW arranged this and also ordered pt a rolling walker.  CSW will continue to follow and assist as needed.

## 2016-05-17 LAB — GLUCOSE, CAPILLARY: Glucose-Capillary: 140 mg/dL — ABNORMAL HIGH (ref 65–99)

## 2016-05-17 NOTE — Progress Notes (Signed)
Patient discharged about 1030 with all belongings and discharge instructions. Discharge instructions went over with patient and wife. All questions answered.

## 2016-05-17 NOTE — Discharge Summary (Signed)
Richard [name] is a 82 y.o. male November 03, 1930 DO:[id number]  Subjective: No new complaints. No new problems. Slept well. Feels well - Ready to go home! Wife at bedside.  Objective: Vital signs in last 24 hours: Temp:  [97.7 F (36.5 C)-98.2 F (36.8 C)] 97.8 F (36.6 C) (09/23 0533) Pulse Rate:  [60-74] 74 (09/23 0533) Resp:  [17-19] 18 (09/23 0533) BP: (106-191)/(55-90) 173/90 (09/23 0533) SpO2:  [97 %-99 %] 97 % (09/23 0533) FiO2 (%):  [21 %] 21 % (09/22 2245) Weight change:  Last BM Date: 05/15/16  Intake/Output from previous day: 09/22 0701 - 09/23 0700 In: 480 [P.O.:480] Out: -   Physical Exam General: No apparent distress   Shaving at sink Lungs: Normal effort. Lungs clear to auscultation, no crackles or wheezes. Cardiovascular: regular rate and rhythm, no edema Musculoskeletal:  Neurovascularly intact Neurological: No new neurological deficits   Lab Results: BMET    Component Value Date/Time   NA 138 05/16/2016 1044   K 4.1 05/16/2016 1044   CL 103 05/16/2016 1044   CO2 27 05/16/2016 1044   GLUCOSE 159 (H) 05/16/2016 1044   BUN 20 05/16/2016 1044   CREATININE 1.36 (H) 05/16/2016 1044   CALCIUM 9.1 05/16/2016 1044   GFRNONAA 46 (L) 05/16/2016 1044   GFRAA 54 (L) 05/16/2016 1044   CBC    Component Value Date/Time   WBC 6.3 05/09/2016 0928   RBC 3.86 (L) 05/09/2016 0928   HGB 11.4 (L) 05/09/2016 0928   HCT 37.1 (L) 05/09/2016 0928   PLT 207 05/09/2016 0928   MCV 96.1 05/09/2016 0928   MCH 29.5 05/09/2016 0928   MCHC 30.7 05/09/2016 0928   RDW 16.1 (H) 05/09/2016 0928   LYMPHSABS 1.1 05/09/2016 0928   MONOABS 0.8 05/09/2016 0928   EOSABS 0.2 05/09/2016 0928   BASOSABS 0.1 05/09/2016 0928   CBG's (last 3):   Recent Labs  05/16/16 1629 05/16/16 2056 05/17/16 0657  GLUCAP 116* 136* 140*   LFT's Lab Results  Component Value Date   ALT 29 05/09/2016   AST 41 05/09/2016   ALKPHOS 94 05/09/2016   BILITOT 0.6 05/09/2016    Studies/Results: No  results found.   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 stroke Active Problems:   Cognitive deficit due to recent cerebrovascular accident (CVA)   Gait disturbance, post-stroke   CKD (chronic kidney disease)   Type 2 diabetes mellitus with circulatory disorder, without long-term current use of insulin (HCC)   Discharge Medications:   Medication List    STOP taking these medications   benazepril 40 MG tablet Commonly known as:  LOTENSIN   diazepam 5 MG tablet Commonly known as:  VALIUM   metFORMIN 1000 MG tablet Commonly known as:  GLUCOPHAGE     TAKE these medications   acetaminophen 500 MG tablet Commonly known as:  TYLENOL Take 500 mg by mouth 2 (two) times daily.   amiodarone 200 MG tablet Commonly known as:  PACERONE Take 200 mg by mouth 3 (three) times a week. On Monday, Wednesday & Friday   amLODipine 10 MG tablet Commonly known as:  NORVASC Take 1 tablet (10 mg total) by mouth daily. What changed:  medication strength  how much to take   aspirin 81 MG EC tablet Take 1 tablet (81 mg total) by mouth daily.   atorvastatin 40 MG tablet Commonly known as:  LIPITOR Take 1 tablet (40 mg total) by mouth daily at 6 PM.   cyanocobalamin  1000 MCG/ML injection Commonly known as:  (VITAMIN B-12) Inject 1,000 mcg into the muscle every 30 (thirty) days.   fluticasone 50 MCG/ACT nasal spray Commonly known as:  FLONASE Place 1 spray into the nose daily as needed for allergies or rhinitis.   metoprolol 50 MG tablet Commonly known as:  LOPRESSOR TAKE 1 TABLET BY MOUTH TWICE A DAY   omeprazole 20 MG capsule Commonly known as:  PRILOSEC Take 20 mg by mouth daily.   OXYGEN Inhale into the lungs at bedtime. CPAP with OXYGEN   pioglitazone 30 MG tablet Commonly known as:  ACTOS Take 30 mg by mouth daily.   senna-docusate 8.6-50 MG tablet Commonly known as:  Senokot-S Take 2 tablets by mouth 2 (two) times daily. For constipation    sertraline 100 MG tablet Commonly known as:  ZOLOFT Take 1 tablet by mouth daily.   XARELTO 20 MG Tabs tablet Generic drug:  rivaroxaban TAKE 1 TABLET (20 MG TOTAL) BY MOUTH DAILY WITH SUPPER.      Hospital Summary with Medical Problem List and Plan: 1. 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 DVT Prophylaxis/Anticoagulation: Pharmaceutical: Xarelto 3. Pain Management: N/A 4. Anxiety disorder/Mood: On Zoloft 100 mg daily as well as valium at bedtime (for years per wife). LCSW to follow for evaluation and support.  5. Neuropsych: This patient is capable of making decisions on hisown behalf. 6. Skin/Wound Care: routine pressure relief measures.  7. Fluids/Electrolytes/Nutrition: Maintain adequate nutrition and hydration status. Monitor I/O. Marland Kitchen Encourage fluid intake 8.T2DM: Was on metformin 1000 mg bid and actos 30 mg daily--Hgb A1c- 5.7. well controlled -CBG (last 3)   Recent Labs (last 2 labs)    Recent Labs  05/15/16 1638 05/15/16 2048 05/16/16 0648  GLUCAP 113* 120* 126*      9. A Fib: Monitor heart rate bid. Continue amiodarone--3 X weekly, metoprolol bid and Xarelto 10. Iron deficiency anemia/B 12 deficiency: Followed by Hem/Onc--Treated with iron infusion on outpatient basis. Receiving monthly B 12 supplement  11. OSA: continue home CPAP with 2 L oxygen at nights.  12. CHB: PPM in place. H/o orthostatic symptoms.  13. HTN:  BP Readings from Last 3 Encounters:  05/17/16 (!) 173/90  05/08/16 (!) 171/77  02/08/16 (!) 155/78            Will need close follow up as outpt for further adjustments 14. Chronic insomnia: Valium as prior admission.  15.  Mild hypoK alemia - s/p supplementation 16.  Constipation- improved on Senna 2 po BID   Length of stay, days: 9  Home today!  Follow-up Information    [name], MD .   Specialty:  Physical Medicine and Rehabilitation Why:   office will call you with follow up appointment Contact information: [place] [address] [phone]        Xu,Jindong, MD. Call today.   Specialty:  Neurology Why:  for follow up appointment. Contact information: [address] [place]  [address] [phone]        Tivis Ringer, MD .   Specialty:  Internal Medicine Why:  Amy from the office will call you on Monday to check on you.  She will give you a hospital follow up visit then. Contact information: [address] [place] [address] [place]. Asa Lente, MD 05/17/2016,  9:14 AM

## 2016-05-19 DIAGNOSIS — I69393 Ataxia following cerebral infarction: Secondary | ICD-10-CM | POA: Diagnosis not present

## 2016-05-19 DIAGNOSIS — I69315 Cognitive social or emotional deficit following cerebral infarction: Secondary | ICD-10-CM | POA: Diagnosis not present

## 2016-05-19 DIAGNOSIS — N189 Chronic kidney disease, unspecified: Secondary | ICD-10-CM | POA: Diagnosis not present

## 2016-05-19 DIAGNOSIS — I129 Hypertensive chronic kidney disease with stage 1 through stage 4 chronic kidney disease, or unspecified chronic kidney disease: Secondary | ICD-10-CM | POA: Diagnosis not present

## 2016-05-19 DIAGNOSIS — E1159 Type 2 diabetes mellitus with other circulatory complications: Secondary | ICD-10-CM | POA: Diagnosis not present

## 2016-05-19 DIAGNOSIS — E1122 Type 2 diabetes mellitus with diabetic chronic kidney disease: Secondary | ICD-10-CM | POA: Diagnosis not present

## 2016-05-21 ENCOUNTER — Encounter (HOSPITAL_COMMUNITY): Payer: Self-pay | Admitting: *Deleted

## 2016-05-21 ENCOUNTER — Inpatient Hospital Stay (HOSPITAL_COMMUNITY)
Admission: EM | Admit: 2016-05-21 | Discharge: 2016-05-24 | DRG: 378 | Disposition: A | Discharge status: Home Health | Payer: Medicare Other | Attending: Internal Medicine | Admitting: Internal Medicine

## 2016-05-21 DIAGNOSIS — E119 Type 2 diabetes mellitus without complications: Secondary | ICD-10-CM

## 2016-05-21 DIAGNOSIS — I69393 Ataxia following cerebral infarction: Secondary | ICD-10-CM | POA: Diagnosis not present

## 2016-05-21 DIAGNOSIS — D519 Vitamin B12 deficiency anemia, unspecified: Secondary | ICD-10-CM | POA: Diagnosis not present

## 2016-05-21 DIAGNOSIS — E1159 Type 2 diabetes mellitus with other circulatory complications: Secondary | ICD-10-CM | POA: Diagnosis not present

## 2016-05-21 DIAGNOSIS — D62 Acute posthemorrhagic anemia: Secondary | ICD-10-CM | POA: Diagnosis not present

## 2016-05-21 DIAGNOSIS — Z6830 Body mass index (BMI) 30.0-30.9, adult: Secondary | ICD-10-CM

## 2016-05-21 DIAGNOSIS — Z888 Allergy status to other drugs, medicaments and biological substances status: Secondary | ICD-10-CM

## 2016-05-21 DIAGNOSIS — Z7984 Long term (current) use of oral hypoglycemic drugs: Secondary | ICD-10-CM

## 2016-05-21 DIAGNOSIS — K921 Melena: Secondary | ICD-10-CM

## 2016-05-21 DIAGNOSIS — I4891 Unspecified atrial fibrillation: Secondary | ICD-10-CM | POA: Diagnosis present

## 2016-05-21 DIAGNOSIS — K317 Polyp of stomach and duodenum: Secondary | ICD-10-CM | POA: Diagnosis present

## 2016-05-21 DIAGNOSIS — Z7901 Long term (current) use of anticoagulants: Secondary | ICD-10-CM

## 2016-05-21 DIAGNOSIS — K219 Gastro-esophageal reflux disease without esophagitis: Secondary | ICD-10-CM | POA: Diagnosis present

## 2016-05-21 DIAGNOSIS — I119 Hypertensive heart disease without heart failure: Secondary | ICD-10-CM | POA: Diagnosis not present

## 2016-05-21 DIAGNOSIS — E785 Hyperlipidemia, unspecified: Secondary | ICD-10-CM | POA: Diagnosis present

## 2016-05-21 DIAGNOSIS — I69315 Cognitive social or emotional deficit following cerebral infarction: Secondary | ICD-10-CM | POA: Diagnosis not present

## 2016-05-21 DIAGNOSIS — K254 Chronic or unspecified gastric ulcer with hemorrhage: Secondary | ICD-10-CM | POA: Diagnosis not present

## 2016-05-21 DIAGNOSIS — I10 Essential (primary) hypertension: Secondary | ICD-10-CM | POA: Diagnosis present

## 2016-05-21 DIAGNOSIS — D120 Benign neoplasm of cecum: Secondary | ICD-10-CM | POA: Diagnosis present

## 2016-05-21 DIAGNOSIS — K922 Gastrointestinal hemorrhage, unspecified: Secondary | ICD-10-CM | POA: Diagnosis present

## 2016-05-21 DIAGNOSIS — Z8719 Personal history of other diseases of the digestive system: Secondary | ICD-10-CM

## 2016-05-21 DIAGNOSIS — K5730 Diverticulosis of large intestine without perforation or abscess without bleeding: Secondary | ICD-10-CM | POA: Diagnosis present

## 2016-05-21 DIAGNOSIS — R531 Weakness: Secondary | ICD-10-CM

## 2016-05-21 DIAGNOSIS — Z885 Allergy status to narcotic agent status: Secondary | ICD-10-CM

## 2016-05-21 DIAGNOSIS — Z950 Presence of cardiac pacemaker: Secondary | ICD-10-CM | POA: Diagnosis present

## 2016-05-21 DIAGNOSIS — N179 Acute kidney failure, unspecified: Secondary | ICD-10-CM | POA: Diagnosis not present

## 2016-05-21 DIAGNOSIS — I447 Left bundle-branch block, unspecified: Secondary | ICD-10-CM | POA: Diagnosis present

## 2016-05-21 DIAGNOSIS — K449 Diaphragmatic hernia without obstruction or gangrene: Secondary | ICD-10-CM | POA: Diagnosis present

## 2016-05-21 DIAGNOSIS — Z87891 Personal history of nicotine dependence: Secondary | ICD-10-CM

## 2016-05-21 DIAGNOSIS — Z7982 Long term (current) use of aspirin: Secondary | ICD-10-CM

## 2016-05-21 DIAGNOSIS — K648 Other hemorrhoids: Secondary | ICD-10-CM | POA: Diagnosis present

## 2016-05-21 DIAGNOSIS — E1122 Type 2 diabetes mellitus with diabetic chronic kidney disease: Secondary | ICD-10-CM | POA: Diagnosis not present

## 2016-05-21 DIAGNOSIS — I129 Hypertensive chronic kidney disease with stage 1 through stage 4 chronic kidney disease, or unspecified chronic kidney disease: Secondary | ICD-10-CM | POA: Diagnosis not present

## 2016-05-21 DIAGNOSIS — E118 Type 2 diabetes mellitus with unspecified complications: Secondary | ICD-10-CM

## 2016-05-21 DIAGNOSIS — N189 Chronic kidney disease, unspecified: Secondary | ICD-10-CM | POA: Diagnosis not present

## 2016-05-21 DIAGNOSIS — I480 Paroxysmal atrial fibrillation: Secondary | ICD-10-CM | POA: Diagnosis not present

## 2016-05-21 DIAGNOSIS — G4733 Obstructive sleep apnea (adult) (pediatric): Secondary | ICD-10-CM | POA: Diagnosis present

## 2016-05-21 DIAGNOSIS — D509 Iron deficiency anemia, unspecified: Secondary | ICD-10-CM | POA: Diagnosis present

## 2016-05-21 DIAGNOSIS — D125 Benign neoplasm of sigmoid colon: Secondary | ICD-10-CM | POA: Diagnosis present

## 2016-05-21 DIAGNOSIS — I638 Other cerebral infarction: Secondary | ICD-10-CM | POA: Diagnosis not present

## 2016-05-21 DIAGNOSIS — R0600 Dyspnea, unspecified: Secondary | ICD-10-CM | POA: Diagnosis not present

## 2016-05-21 DIAGNOSIS — I639 Cerebral infarction, unspecified: Secondary | ICD-10-CM | POA: Diagnosis present

## 2016-05-21 DIAGNOSIS — Z6828 Body mass index (BMI) 28.0-28.9, adult: Secondary | ICD-10-CM | POA: Diagnosis not present

## 2016-05-21 LAB — CBC
HEMATOCRIT: 23 % — AB (ref 39.0–52.0)
HEMOGLOBIN: 6.9 g/dL — AB (ref 13.0–17.0)
MCH: 29.5 pg (ref 26.0–34.0)
MCHC: 30 g/dL (ref 30.0–36.0)
MCV: 98.3 fL (ref 78.0–100.0)
Platelets: 205 10*3/uL (ref 150–400)
RBC: 2.34 MIL/uL — AB (ref 4.22–5.81)
RDW: 17 % — ABNORMAL HIGH (ref 11.5–15.5)
WBC: 6.7 10*3/uL (ref 4.0–10.5)

## 2016-05-21 LAB — DIFFERENTIAL
BASOS PCT: 0 %
Basophils Absolute: 0 10*3/uL (ref 0.0–0.1)
EOS ABS: 0.1 10*3/uL (ref 0.0–0.7)
EOS PCT: 2 %
Lymphocytes Relative: 21 %
Lymphs Abs: 1.4 10*3/uL (ref 0.7–4.0)
MONO ABS: 0.4 10*3/uL (ref 0.1–1.0)
MONOS PCT: 7 %
NEUTROS ABS: 4.4 10*3/uL (ref 1.7–7.7)
Neutrophils Relative %: 70 %

## 2016-05-21 LAB — POC OCCULT BLOOD, ED: Fecal Occult Bld: POSITIVE — AB

## 2016-05-21 LAB — COMPREHENSIVE METABOLIC PANEL
ALT: 15 U/L — ABNORMAL LOW (ref 17–63)
ANION GAP: 9 (ref 5–15)
AST: 21 U/L (ref 15–41)
Albumin: 3.2 g/dL — ABNORMAL LOW (ref 3.5–5.0)
Alkaline Phosphatase: 65 U/L (ref 38–126)
BUN: 28 mg/dL — ABNORMAL HIGH (ref 6–20)
CALCIUM: 8.9 mg/dL (ref 8.9–10.3)
CHLORIDE: 107 mmol/L (ref 101–111)
CO2: 25 mmol/L (ref 22–32)
Creatinine, Ser: 1.35 mg/dL — ABNORMAL HIGH (ref 0.61–1.24)
GFR calc non Af Amer: 47 mL/min — ABNORMAL LOW (ref >60)
GFR, EST AFRICAN AMERICAN: 54 mL/min — AB (ref >60)
Glucose, Bld: 119 mg/dL — ABNORMAL HIGH (ref 65–99)
POTASSIUM: 4.2 mmol/L (ref 3.5–5.1)
Sodium: 141 mmol/L (ref 135–145)
Total Bilirubin: 0.5 mg/dL (ref 0.3–1.2)
Total Protein: 6.1 g/dL — ABNORMAL LOW (ref 6.5–8.1)

## 2016-05-21 LAB — PREPARE RBC (CROSSMATCH)

## 2016-05-21 LAB — GLUCOSE, CAPILLARY: Glucose-Capillary: 228 mg/dL — ABNORMAL HIGH (ref 65–99)

## 2016-05-21 LAB — PROTIME-INR
INR: 1.32
PROTHROMBIN TIME: 16.5 s — AB (ref 11.4–15.2)

## 2016-05-21 LAB — APTT: APTT: 34 s (ref 24–36)

## 2016-05-21 MED ORDER — FAMOTIDINE IN NACL 20-0.9 MG/50ML-% IV SOLN: 20.0000 mg | Freq: Two times a day (BID) | INTRAVENOUS | Status: DC

## 2016-05-21 MED ORDER — SENNOSIDES-DOCUSATE SODIUM 8.6-50 MG PO TABS: 1.0000 | ORAL_TABLET | Freq: Every evening | ORAL | Status: DC | PRN

## 2016-05-21 MED ORDER — PANTOPRAZOLE SODIUM 40 MG IV SOLR
40.0000 mg | Freq: Two times a day (BID) | INTRAVENOUS | Status: DC
  Administered 2016-05-22: 40 mg via INTRAVENOUS
  Filled 2016-05-21 (×2): qty 40

## 2016-05-21 MED ORDER — ACETAMINOPHEN 650 MG RE SUPP: 650.0000 mg | Freq: Four times a day (QID) | RECTAL | Status: DC | PRN

## 2016-05-21 MED ORDER — SODIUM CHLORIDE 0.9 % IV SOLN
INTRAVENOUS | Status: DC
  Administered 2016-05-21 – 2016-05-24 (×3): via INTRAVENOUS

## 2016-05-21 MED ORDER — BISACODYL 10 MG RE SUPP: 10.0000 mg | Freq: Every day | RECTAL | Status: DC | PRN

## 2016-05-21 MED ORDER — AMIODARONE HCL 200 MG PO TABS
200.0000 mg | ORAL_TABLET | ORAL | Status: DC
  Administered 2016-05-23: 200 mg via ORAL
  Filled 2016-05-21: qty 1

## 2016-05-21 MED ORDER — ACETAMINOPHEN 500 MG PO TABS
500.0000 mg | ORAL_TABLET | Freq: Two times a day (BID) | ORAL | Status: DC
  Administered 2016-05-21 – 2016-05-24 (×5): 500 mg via ORAL
  Filled 2016-05-21 (×5): qty 1

## 2016-05-21 MED ORDER — SERTRALINE HCL 100 MG PO TABS
100.0000 mg | ORAL_TABLET | Freq: Every day | ORAL | Status: DC
  Administered 2016-05-22 – 2016-05-24 (×2): 100 mg via ORAL
  Filled 2016-05-21 (×2): qty 1

## 2016-05-21 MED ORDER — PANTOPRAZOLE SODIUM 40 MG IV SOLR
40.0000 mg | Freq: Once | INTRAVENOUS | Status: AC
  Administered 2016-05-21: 40 mg via INTRAVENOUS
  Filled 2016-05-21: qty 40

## 2016-05-21 MED ORDER — SODIUM CHLORIDE 0.9 % IV SOLN: Freq: Once | INTRAVENOUS | Status: DC

## 2016-05-21 MED ORDER — ACETAMINOPHEN 325 MG PO TABS: 650.0000 mg | ORAL_TABLET | Freq: Four times a day (QID) | ORAL | Status: DC | PRN

## 2016-05-21 MED ORDER — PANTOPRAZOLE SODIUM 40 MG IV SOLR: 40.0000 mg | Freq: Once | INTRAVENOUS | Status: DC

## 2016-05-21 MED ORDER — SODIUM CHLORIDE 0.9 % IV SOLN
Freq: Once | INTRAVENOUS | Status: AC
  Administered 2016-05-21: 15:00:00 via INTRAVENOUS

## 2016-05-21 MED ORDER — AMLODIPINE BESYLATE 10 MG PO TABS
10.0000 mg | ORAL_TABLET | Freq: Every day | ORAL | Status: DC
  Administered 2016-05-22 – 2016-05-24 (×2): 10 mg via ORAL
  Filled 2016-05-21 (×2): qty 1

## 2016-05-21 MED ORDER — ATORVASTATIN CALCIUM 40 MG PO TABS
40.0000 mg | ORAL_TABLET | Freq: Every day | ORAL | Status: DC
  Administered 2016-05-22 – 2016-05-23 (×2): 40 mg via ORAL
  Filled 2016-05-21 (×2): qty 1

## 2016-05-21 MED ORDER — INSULIN ASPART 100 UNIT/ML ~~LOC~~ SOLN
0.0000 [IU] | Freq: Three times a day (TID) | SUBCUTANEOUS | Status: DC
  Administered 2016-05-22 (×2): 1 [IU] via SUBCUTANEOUS
  Administered 2016-05-23: 2 [IU] via SUBCUTANEOUS
  Administered 2016-05-23: 3 [IU] via SUBCUTANEOUS
  Administered 2016-05-24: 1 [IU] via SUBCUTANEOUS

## 2016-05-21 MED ORDER — MAGNESIUM CITRATE PO SOLN: 1.0000 | Freq: Once | ORAL | Status: DC | PRN

## 2016-05-21 MED ORDER — ONDANSETRON HCL 4 MG/2ML IJ SOLN: 4.0000 mg | Freq: Four times a day (QID) | INTRAMUSCULAR | Status: DC | PRN

## 2016-05-21 MED ORDER — METOPROLOL TARTRATE 50 MG PO TABS
50.0000 mg | ORAL_TABLET | Freq: Two times a day (BID) | ORAL | Status: DC
  Administered 2016-05-22 – 2016-05-24 (×5): 50 mg via ORAL
  Filled 2016-05-21 (×5): qty 1

## 2016-05-21 MED ORDER — ONDANSETRON HCL 4 MG PO TABS: 4.0000 mg | ORAL_TABLET | Freq: Four times a day (QID) | ORAL | Status: DC | PRN

## 2016-05-21 MED ORDER — SODIUM CHLORIDE 0.9% FLUSH
3.0000 mL | Freq: Two times a day (BID) | INTRAVENOUS | Status: DC
Start: 2016-05-21 — End: 2016-05-24
  Administered 2016-05-21 – 2016-05-24 (×5): 3 mL via INTRAVENOUS

## 2016-05-21 NOTE — ED Notes (Signed)
Spoke to [employer] in the main lab. They have a blue top and will run the PTT and INR

## 2016-05-21 NOTE — ED Triage Notes (Signed)
Pt reports noticing dark stools yesterday. Pt reports feeling weak and had blood drawn and was told that his hemoglobin was 6. Pt noted to be pale in triage.

## 2016-05-21 NOTE — Consult Note (Signed)
[facility] Consult: 2:42 PM 05/21/2016  LOS: 0 days    Referring Provider: Dr Wyvonnia Dusky in ED  Primary Care Physician:  Tivis Ringer, MD Primary Gastroenterologist:  Dr. Carlean Purl    Reason for Consultation:  Hemoglobin 6.9, tarry stools.   HPI: Richard [name] is a 82 y.o. male.  Hx OSA. B12 deficiency. S/P cardiac pacemaker for complete heart block..  DM 2.  OSA.   History of iron deficiency anemia but not currently taking iron supplements. A. fib, on Xarelto. Admitted to Hospital 9/11 through 04/25/16 with vertigo.  Stroke team felt that he had sustained a small cerebellar infarct.  81 mg Aspirin was added to his medical regimen. He did not undergo MRI because of presence of cardiac pacemaker.  06/21/2015 EGD sooner for IDA. 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 Large hiatal hernia but no Cameron's erosions 06/2004 colonoscopy for FOBT positive, iron deficiency anemia. Revealed diverticulosis, hyperplastic colon polyps, internal/external hemorrhoids. 06/2004 EGD. Large hiatal hernia without Cameron's erosions.  Gastric polyp (pathology hyperplastic)  Around 6 PM last night he had the first abnormal stool which was dark and tarry. Previous stool earlier in the day and on previous days have been formed and brown. He had another similar-looking stools this morning.Marland Kitchen He had been feeling increasingly weak over the last several days but this was not acutely worse since the stools changed.. Patient went to his PMD today and hemoglobin measured at 6.9 and he was sent to the ED for evaluation and admission.  Hgb during recent admission was as low as 9.5 but 11.4 at discharge on 05/09/16. MCV consistently normal, currently 98.  Platelets, WBCs normal.  INR  normal at 1.3.  BUN is 28, creatinine 1.3. These were 20/1.3 on 05/16/2016.   Patient denies N/V, abdominal pain. Last dose of Xarelto was in the evening of 9/26, last aspirin dose was this morning 9/27.   2 units PRBCs ordered, the first has just been hung. Patient is not using any NSAIDs, has been compliant with the low-dose aspirin. Patient's appetite has been good. With the bleeding last night and again this morning, he has not been dizzy, weak, dyspneic.  Not having chest pain or palpitations. He denies recent falls, he is using his walker reliably for ongoing balance problems. He informs purpura on his arms easily but has no bleeding skin lesions, no nose bleeds. Normally has bowel movements are brown. Weight is stable. No dysphagia, no heartburn or indigestion. He takes one Prilosec daily.      Past Medical History:  Diagnosis Date  . Anxiety   . Arthritis   . Atrial fibrillation ([place])    a. Dx 03/2013, notes report atrial fibrillation/atrial flutter, placed on amiodarone. NSR in subsequent OV's.  . B12 deficiency anemia   . Chest pain    a. H/o CTA negative for PE 2012, normal cath 2005, normal nucs previously including 05/2012.  Marland Kitchen Complete heart block (HCC)    a. s/p Medtronic Adapta L model ADDRL 1 (serial  number [id number]) pacemaker.  Marland Kitchen GERD (gastroesophageal reflux disease)   . Hiatal hernia   . Hyperlipidemia   . Hypertension   . Iron deficiency anemia   . LBBB (left bundle branch block)   . Microcytic anemia   . Nephrolithiasis   . On home oxygen therapy    a. 2L w/CPAP at night  . Orthostatic hypotension   . OSA on CPAP    cpap  . [place] spotted fever ~ 1945  . Sinus drainage   . Skin cancer of lip   . Type II diabetes mellitus ([place])     Past Surgical History:  Procedure Laterality Date  . CARDIAC CATHETERIZATION     by Dr. Acie Fredrickson, January 24, 2004, that shows minimal coronary artery irregularities and normal left ventricular function  . CATARACT  EXTRACTION W/ INTRAOCULAR LENS  IMPLANT, BILATERAL Bilateral   . ESOPHAGOGASTRODUODENOSCOPY (EGD) WITH PROPOFOL N/A 06/21/2015   Procedure: ESOPHAGOGASTRODUODENOSCOPY (EGD) WITH PROPOFOL;  Surgeon: Gatha Mayer, MD;  Location: [facility] ENDOSCOPY;  Service: Endoscopy;  Laterality: N/A;  . INGUINAL HERNIA REPAIR Right   . LUMBAR DISC SURGERY  ~ 1993  . PERMANENT PACEMAKER INSERTION N/A 06/03/2014   MDT Adapta L implanted by Dr Rayann Heman for syncope and transient AV block  . SKIN CANCER EXCISION     "lower lip" (04/06/2013)  . TEMPORARY PACEMAKER INSERTION N/A 05/31/2014   Procedure: TEMPORARY WIRE;  Surgeon: Sinclair Grooms, MD;  Location: [facility] CATH LAB;  Service: Cardiovascular;  Laterality: N/A;  . VASECTOMY     Hx of     Prior to Admission medications   Medication Sig Start Date End Date Taking? Authorizing Provider  acetaminophen (TYLENOL) 500 MG tablet Take 500 mg by mouth 2 (two) times daily.     Historical Provider, MD  amiodarone (PACERONE) 200 MG tablet Take 200 mg by mouth 3 (three) times a week. On Monday, Wednesday & Friday    Historical Provider, MD  amLODipine (NORVASC) 10 MG tablet Take 1 tablet (10 mg total) by mouth daily. 05/16/16   Bary Leriche, PA-C  aspirin EC 81 MG EC tablet Take 1 tablet (81 mg total) by mouth daily. 05/16/16   Bary Leriche, PA-C  atorvastatin (LIPITOR) 40 MG tablet Take 1 tablet (40 mg total) by mouth daily at 6 PM. 05/16/16   Ivan Anchors Love, PA-C  cyanocobalamin (,VITAMIN B-12,) 1000 MCG/ML injection Inject 1,000 mcg into the muscle every 30 (thirty) days.    Historical Provider, MD  fluticasone (FLONASE) 50 MCG/ACT nasal spray Place 1 spray into the nose daily as needed for allergies or rhinitis.     Historical Provider, MD  metoprolol (LOPRESSOR) 50 MG tablet TAKE 1 TABLET BY MOUTH TWICE A DAY 07/06/15   Thayer Headings, MD  omeprazole (PRILOSEC) 20 MG capsule Take 20 mg by mouth daily.     Historical Provider, MD  OXYGEN Inhale into the lungs at bedtime. CPAP  with OXYGEN    Historical Provider, MD  pioglitazone (ACTOS) 30 MG tablet Take 30 mg by mouth daily.      Historical Provider, MD  senna-docusate (SENOKOT-S) 8.6-50 MG tablet Take 2 tablets by mouth 2 (two) times daily. For constipation 05/16/16   Ivan Anchors Love, PA-C  sertraline (ZOLOFT) 100 MG tablet Take 1 tablet by mouth daily. 08/21/14   Historical Provider, MD  XARELTO 20 MG TABS tablet TAKE 1 TABLET (20 MG TOTAL) BY MOUTH DAILY WITH SUPPER. 07/23/15   Wonda Cheng  Nahser, MD    Scheduled Meds:   Infusions: . sodium chloride     PRN Meds:    Allergies as of 05/21/2016 - Review Complete 05/21/2016  Allergen Reaction Noted  . Codeine Other (See Comments) 06/12/2011  . Citalopram Swelling and Rash 05/31/2014    Family History  Problem Relation Age of Onset  . Thyroid disease Mother     goiter  . Pneumonia Father   . Other      Parents both died of old age; other conditions not known  . Stroke Brother   . Breast cancer Sister     Social History   Social History  . Marital status: Married    Spouse name: N/A  . Number of children: 2  . Years of education: N/A   Occupational History  . retired    Social History Main Topics  . Smoking status: Former Smoker    Packs/day: 0.00    Years: 20.00    Types: Cigarettes  . Smokeless tobacco: Never Used     Comment: 04/06/2013 "quit smoking years ago; didn't smoke that much; probably 20 years; 1ppd"  . Alcohol use No  . Drug use: No  . Sexual activity: No   Other Topics Concern  . Not on file   Social History Narrative  . No narrative on file    REVIEW OF SYSTEMS: Constitutional:  Ongoing weakness, nothing new in the last 24 hours. ENT:  No nose bleeds Pulm:  No new dyspnea. No cough. CV:  No palpitations, no LE edema.  No chest pain. GU:  Positive for urinary urgency. No hematuria, no frequency GI:  Per HPI Heme:  Per HPI   Transfusions:  Patient recalls previous blood transfusions but it is been many years. I do  not see any records of blood transfusions dating back to the 1990s during an Epic review. Neuro:  Has suffered from repeated falls over the last year or so. Some blurry vision.  Lower extremity weakness, legs give way:  Occurring for last several months. Psych: Feels anxious and nervous. Some insomnia. Derm:  No itching, no rash or sores.  Endocrine:  No sweats or chills.  No polyuria or dysuria Immunization:  Reviewed. I do not see influenza vaccination since 04/2015 Travel:  None beyond local counties in last few months.    PHYSICAL EXAM: Vital signs in last 24 hours: Vitals:   05/21/16 1322 05/21/16 1419  BP: 142/66 153/68  Pulse: 63 65  Resp: 14 13  Temp:     Wt Readings from Last 3 Encounters:  05/21/16 110.7 kg (244 lb)  05/15/16 109.5 kg (241 lb 6.5 oz)  05/05/16 113.2 kg (249 lb 9 oz)    General: Pale, overweight, aged and somewhat frail appearing white male. He is alert and comfortable. Head:  No signs of head trauma. No asymmetry. No facial edema.  Eyes:  Conjunctiva is pale. No scleral icterus. EOMI. Ears:  HOH  Nose:  No congestion or discharge Mouth:  Full set of dentures in place, not removed. Mucous membranes are moist and clear. No oral pharyngeal lesions. Neck:  No JVD, no TMG. No masses. Neck was not auscultated. Lungs:  Somewhat diminished but clear bilaterally. No labored breathing or cough. Heart: RRR. S1, S2 audible. S3 gallop perceived. Abdomen:  Soft. NT, ND. Normal bowel sounds. No tinkling, tympanitic bowel sounds. No bruits. No masses, hernias or HSM.Marland Kitchen   Rectal: Deferred. Stool was black, soft/tarry and FOBT positive per ED provider exam.  Musc/Skeltl: No joint swelling or pain. Extremities:  No CCE.  Feet warm  Neurologic:  Patient is alert. Oriented 3. His response time and speech is a bit slow but accurate. He is easy to understand. He moves all 4 limbs. Did not test limb strength but no tremor or gross weakness apparent. Skin:  Purpura on the  forearms, not extensive. No sores or large hematomas. Tattoos:  None Nodes:  No cervical adenopathy.   Psych:  Calm, cooperative. Affect not depressed.  Intake/Output from previous day: No intake/output data recorded. Intake/Output this shift: No intake/output data recorded.  LAB RESULTS:  Recent Labs  05/21/16 1226  WBC 6.7  HGB 6.9*  HCT 23.0*  PLT 205   BMET Lab Results  Component Value Date   NA 141 05/21/2016   NA 138 05/16/2016   NA 139 05/11/2016   K 4.2 05/21/2016   K 4.1 05/16/2016   K 3.5 05/11/2016   CL 107 05/21/2016   CL 103 05/16/2016   CL 102 05/11/2016   CO2 25 05/21/2016   CO2 27 05/16/2016   CO2 29 05/11/2016   GLUCOSE 119 (H) 05/21/2016   GLUCOSE 159 (H) 05/16/2016   GLUCOSE 148 (H) 05/11/2016   BUN 28 (H) 05/21/2016   BUN 20 05/16/2016   BUN 27 (H) 05/11/2016   CREATININE 1.35 (H) 05/21/2016   CREATININE 1.36 (H) 05/16/2016   CREATININE 1.33 (H) 05/11/2016   CALCIUM 8.9 05/21/2016   CALCIUM 9.1 05/16/2016   CALCIUM 8.6 (L) 05/11/2016   LFT  Recent Labs  05/21/16 1226  PROT 6.1*  ALBUMIN 3.2*  AST 21  ALT 15*  ALKPHOS 65  BILITOT 0.5   PT/INR Lab Results  Component Value Date   INR 1.32 05/21/2016   INR 1.22 05/05/2016   INR 1.05 09/28/2014   Hepatitis Panel No results for input(s): HEPBSAG, HCVAB, HEPAIGM, HEPBIGM in the last 72 hours. C-Diff No components found for: CDIFF Lipase  No results found for: LIPASE  Drugs of Abuse     Component Value Date/Time   LABOPIA NONE DETECTED 05/05/2016 1118   COCAINSCRNUR NONE DETECTED 05/05/2016 1118   LABBENZ POSITIVE (A) 05/05/2016 1118   AMPHETMU NONE DETECTED 05/05/2016 1118   THCU NONE DETECTED 05/05/2016 1118   LABBARB NONE DETECTED 05/05/2016 1118     RADIOLOGY STUDIES: No results found.  ENDOSCOPIC STUDIES: See history of present illness  IMPRESSION:   *  Acute anemia associated with FOBT positive, melenic type stool. BUN is elevated. Suspect upper GI bleed.  History of iron deficiency anemia and FOBT positive stools in the past with history of both adenomatous and hyperplastic gastric polyps as well as hyper plastic colon polyps. By tomorrow morning he will have received 2 units of packed red blood cells.  *  Recent suspected small cerebellar infarct.  83 ASA added to chronic Xarelto in use for hx A fib.     PLAN:     *  Transfuse blood as ordered. Okay to have clears tonight but nothing by mouth after midnight as upper endoscopy will be arranged for tomorrow, ~ 2PM. Begin twice daily PPI IV. The pharmacy is take care of this because of because of restrictions on ordering IV Protonix.  Repeat CBC in the morning.     Azucena Freed  05/21/2016, 2:42 PM Pager: [phone]

## 2016-05-21 NOTE — ED Notes (Signed)
Report attempted to [facility] 

## 2016-05-21 NOTE — H&P (Signed)
History and Physical    Richard [name] [id number] DOB: 1931/03/19 DOA: 05/21/2016   PCP: Tivis Ringer, MD   Patient coming from:  Home  Chief Complaint: GI bleed   HPI: Richard [name] is a 82 y.o. male with medical history significant for HTN, HLD,  Afib on Xarelto, B-12 deficiency, Gerd, hiatal hernia, hyperlipidemia, hypertension, iron deficiency anemia followed and the cancer center, pacemaker placed due to CHB,orthostatic hypotension, OSA on CPAP,diabetes non-insulin-dependent, and recent admission from 9/11 till 9/16 for ataxia due to stroke, requiring Inpatient Rehab, now  presenting to he ED with increasing symptoms of weakness. He was seen by his PCP this morning, with Hb was 6.9 and was told to come to the ED for further workup . He reports black stools since last night, total of 3, without diarrhea.Of note, on discharge, ASA daily  was added to his Xarelto (night of 9/26) Denies abdominal pain, nausea or vomiting. Denies any dizziness. Denies any shortness of breath or cough. Denies any dysuria. Denies other bleeding issues such as hematuria, hematemesis or hemoptysis. He is recoivering well from stroke, with his motor skills at baseline. No confusion, headaches, vision changes or seizures noted  Last  Upper endoscopy    ED Course:  BP 166/75   Pulse 65   Temp 98.6 F (37 C) (Oral)   Resp 18   Ht 6\' 3"  (1.905 m)   Wt 110.7 kg (244 lb)   SpO2 95%   BMI 30.50 kg/m      Review of Systems: As per HPI otherwise 10 point review of systems negative.   Past Medical History:  Diagnosis Date  . Anxiety   . Arthritis   . Atrial fibrillation ([place])    a. Dx 03/2013, notes report atrial fibrillation/atrial flutter, placed on amiodarone. NSR in subsequent OV's.  . B12 deficiency anemia   . Chest pain    a. H/o CTA negative for PE 2012, normal cath 2005, normal nucs previously including 05/2012.  Marland Kitchen Complete heart block (HCC)    a. s/p Medtronic Adapta L model ADDRL 1  (serial number [id number]) pacemaker.  Marland Kitchen GERD (gastroesophageal reflux disease)   . Hiatal hernia   . Hyperlipidemia   . Hypertension   . Iron deficiency anemia   . LBBB (left bundle branch block)   . Microcytic anemia   . Nephrolithiasis   . On home oxygen therapy    a. 2L w/CPAP at night  . Orthostatic hypotension   . OSA on CPAP    cpap  . [place] spotted fever ~ 1945  . Sinus drainage   . Skin cancer of lip   . Type II diabetes mellitus ([place])     Past Surgical History:  Procedure Laterality Date  . CARDIAC CATHETERIZATION     by Dr. Acie Fredrickson, January 24, 2004, that shows minimal coronary artery irregularities and normal left ventricular function  . CATARACT EXTRACTION W/ INTRAOCULAR LENS  IMPLANT, BILATERAL Bilateral   . ESOPHAGOGASTRODUODENOSCOPY (EGD) WITH PROPOFOL N/A 06/21/2015   Procedure: ESOPHAGOGASTRODUODENOSCOPY (EGD) WITH PROPOFOL;  Surgeon: Gatha Mayer, MD;  Location: [facility];  Service: Endoscopy;  Laterality: N/A;  . INGUINAL HERNIA REPAIR Right   . LUMBAR DISC SURGERY  ~ 1993  . PERMANENT PACEMAKER INSERTION N/A 06/03/2014   MDT Adapta L implanted by Dr Rayann Heman for syncope and transient AV block  . SKIN CANCER EXCISION     "lower lip" (04/06/2013)  . TEMPORARY PACEMAKER INSERTION N/A 05/31/2014   Procedure:  TEMPORARY WIRE;  Surgeon: Sinclair Grooms, MD;  Location: [facility] CATH LAB;  Service: Cardiovascular;  Laterality: N/A;  . VASECTOMY     Hx of     Social History Social History   Social History  . Marital status: Married    Spouse name: N/A  . Number of children: 2  . Years of education: N/A   Occupational History  . retired    Social History Main Topics  . Smoking status: Former Smoker    Packs/day: 0.00    Years: 20.00    Types: Cigarettes  . Smokeless tobacco: Never Used     Comment: 04/06/2013 "quit smoking years ago; didn't smoke that much; probably 20 years; 1ppd"  . Alcohol use No  . Drug use: No  . Sexual activity: No    Other Topics Concern  . Not on file   Social History Narrative  . No narrative on file     Allergies  Allergen Reactions  . Codeine Other (See Comments)    Hallucinations   . Citalopram Swelling and Rash    Family History  Problem Relation Age of Onset  . Thyroid disease Mother     goiter  . Pneumonia Father   . Other      Parents both died of old age; other conditions not known  . Stroke Brother   . Breast cancer Sister       Prior to Admission medications   Medication Sig Start Date End Date Taking? Authorizing Provider  acetaminophen (TYLENOL) 500 MG tablet Take 500 mg by mouth 2 (two) times daily.     Historical Provider, MD  amiodarone (PACERONE) 200 MG tablet Take 200 mg by mouth 3 (three) times a week. On Monday, Wednesday & Friday    Historical Provider, MD  amLODipine (NORVASC) 10 MG tablet Take 1 tablet (10 mg total) by mouth daily. 05/16/16   Bary Leriche, PA-C  aspirin EC 81 MG EC tablet Take 1 tablet (81 mg total) by mouth daily. 05/16/16   Bary Leriche, PA-C  atorvastatin (LIPITOR) 40 MG tablet Take 1 tablet (40 mg total) by mouth daily at 6 PM. 05/16/16   Ivan Anchors Love, PA-C  cyanocobalamin (,VITAMIN B-12,) 1000 MCG/ML injection Inject 1,000 mcg into the muscle every 30 (thirty) days.    Historical Provider, MD  fluticasone (FLONASE) 50 MCG/ACT nasal spray Place 1 spray into the nose daily as needed for allergies or rhinitis.     Historical Provider, MD  metoprolol (LOPRESSOR) 50 MG tablet TAKE 1 TABLET BY MOUTH TWICE A DAY 07/06/15   Thayer Headings, MD  omeprazole (PRILOSEC) 20 MG capsule Take 20 mg by mouth daily.     Historical Provider, MD  OXYGEN Inhale into the lungs at bedtime. CPAP with OXYGEN    Historical Provider, MD  pioglitazone (ACTOS) 30 MG tablet Take 30 mg by mouth daily.      Historical Provider, MD  senna-docusate (SENOKOT-S) 8.6-50 MG tablet Take 2 tablets by mouth 2 (two) times daily. For constipation 05/16/16   Ivan Anchors Love, PA-C   sertraline (ZOLOFT) 100 MG tablet Take 1 tablet by mouth daily. 08/21/14   Historical Provider, MD  XARELTO 20 MG TABS tablet TAKE 1 TABLET (20 MG TOTAL) BY MOUTH DAILY WITH SUPPER. 07/23/15   Thayer Headings, MD    Physical Exam:    Vitals:   05/21/16 1700 05/21/16 1715 05/21/16 1730 05/21/16 1758  BP: 162/75 165/76 165/76 166/75  Pulse:  64 68 65 65  Resp: 18 18 16 18   Temp:    98.6 F (37 C)  TempSrc:    Oral  SpO2: 97% 94% 97% 95%  Weight:      Height:           Constitutional: NAD, calm, comfortable  Vitals:   05/21/16 1700 05/21/16 1715 05/21/16 1730 05/21/16 1758  BP: 162/75 165/76 165/76 166/75  Pulse: 64 68 65 65  Resp: 18 18 16 18   Temp:    98.6 F (37 C)  TempSrc:    Oral  SpO2: 97% 94% 97% 95%  Weight:      Height:       Eyes: PERRL, lids and conjunctivae normal ENMT: Mucous membranes are moist. Posterior pharynx clear of any exudate or lesions.Normal dentition.  Neck: normal, supple, no masses, no thyromegaly Respiratory: clear to auscultation bilaterally, no wheezing, no crackles. Normal respiratory effort. No accessory muscle use.  Cardiovascular:  Paced Regular rate and rhythm, no murmurs / rubs / gallops. No extremity edema. 2+ pedal pulses. No carotid bruits.  Abdomen: no tenderness, no masses palpated. No hepatosplenomegaly. Bowel sounds positive.  Musculoskeletal: no clubbing / cyanosis. No joint deformity upper and lower extremities. Good ROM, no contractures. Normal muscle tone.  Skin: no rashes, lesions, ulcers.  Neurologic: CN 2-12 grossly intact. Sensation intact, DTR normal. Strength 5/5 in all 4.  Psychiatric: Normal judgment and insight. Alert and oriented x 3. Normal mood.     Labs on Admission: I have personally reviewed following labs and imaging studies  CBC:  Recent Labs Lab 05/21/16 1226  WBC 6.7  NEUTROABS 4.4  HGB 6.9*  HCT 23.0*  MCV 98.3  PLT 99991111    Basic Metabolic Panel:  Recent Labs Lab 05/16/16 1044  05/21/16 1226  NA 138 141  K 4.1 4.2  CL 103 107  CO2 27 25  GLUCOSE 159* 119*  BUN 20 28*  CREATININE 1.36* 1.35*  CALCIUM 9.1 8.9    GFR: Estimated Creatinine Clearance (by C-G formula based on SCr of 1.35 mg/dL (H)) Male: 46 mL/min Male: 55.7 mL/min  Liver Function Tests:  Recent Labs Lab 05/21/16 1226  AST 21  ALT 15*  ALKPHOS 65  BILITOT 0.5  PROT 6.1*  ALBUMIN 3.2*   No results for input(s): LIPASE, AMYLASE in the last 168 hours. No results for input(s): AMMONIA in the last 168 hours.  Coagulation Profile:  Recent Labs Lab 05/21/16 1226  INR 1.32    Cardiac Enzymes: No results for input(s): CKTOTAL, CKMB, CKMBINDEX, TROPONINI in the last 168 hours.  BNP (last 3 results) No results for input(s): PROBNP in the last 8760 hours.  HbA1C: No results for input(s): HGBA1C in the last 72 hours.  CBG:  Recent Labs Lab 05/16/16 0648 05/16/16 1154 05/16/16 1629 05/16/16 2056 05/17/16 0657  GLUCAP 126* 91 116* 136* 140*    Lipid Profile: No results for input(s): CHOL, HDL, LDLCALC, TRIG, CHOLHDL, LDLDIRECT in the last 72 hours.  Thyroid Function Tests: No results for input(s): TSH, T4TOTAL, FREET4, T3FREE, THYROIDAB in the last 72 hours.  Anemia Panel: No results for input(s): VITAMINB12, FOLATE, FERRITIN, TIBC, IRON, RETICCTPCT in the last 72 hours.  Urine analysis:    Component Value Date/Time   COLORURINE YELLOW 05/10/2016 [place] 05/10/2016 1719   LABSPEC 1.012 05/10/2016 1719   PHURINE 5.0 05/10/2016 1719   GLUCOSEU NEGATIVE 05/10/2016 1719   HGBUR SMALL (A) 05/10/2016 1719   BILIRUBINUR NEGATIVE  05/10/2016 1719   [place] 05/10/2016 1719   PROTEINUR 100 (A) 05/10/2016 1719   UROBILINOGEN 0.2 03/30/2014 1400   NITRITE NEGATIVE 05/10/2016 1719   LEUKOCYTESUR TRACE (A) 05/10/2016 1719    Sepsis Labs: @LABRCNTIP (procalcitonin:4,lacticidven:4) )No results found for this or any previous visit (from the  past 240 hour(s)).   Radiological Exams on Admission: No results found.  EKG: Independently reviewed.  Assessment/Plan Active Problems:   GIB (gastrointestinal bleeding)   Benign essential HTN   Diabetes mellitus (HCC)   Atrial fibrillation (HCC)   GERD (gastroesophageal reflux disease)   Generalized weakness   Cerebellar stroke, acute (HCC)   Hyperlipidemia   Cardiac pacemaker in situ   Type 2 diabetes mellitus with circulatory disorder, without long-term current use of insulin (HCC)   Gastro Intestinal Bleed  Likely (higher)  Etiology is unknown at this time, but suspect ASA addition to his anticoagulation.  IV Protonix give current Hgb is 6.9   BUN is 28. Of note, patient has a History of anemia, BL in the 10s followed by Dr. Whitney Muse, now worsened to GIB. To receive 1 unit of blood  Admit to Med tele obs Transfuse for Hgb less than 8  IV protonix. IVF  Hold BPmeds NPO until eval by GI, then transition to liquid diet  Dr. Gastroenterology has been consulted. Hold NSAIDs Check H pylori    Hypertension BP 166/75   Pulse 65   Temp 98.6 F (37 C) (Oral)   Resp 18   Ht 6\' 3"  (1.905 m)   Wt 110.7 kg (244 lb)   SpO2 95%   BMI 30.50 kg/m  Controlled Hold  home anti-hypertensive medications, resume in am    Atrial Fibrillation on anticoagulation with Xarelto  Rate controlled Hold Xarelto due to GIB Continue meds  Type II Diabetes Current blood sugar level is 119 Lab Results  Component Value Date   HGBA1C 5.7 (H) 05/06/2016  Hold home oral diabetic medications.  SSI  NPO to transition to Heart healthy carb modified diet whrn ok with GI  Depression Continue Zoloft   Chronic kidney disease stage 3 baseline creatinine 1.3, no acute issues     Lab Results  Component Value Date   CREATININE 1.35 (H) 05/21/2016   CREATININE 1.36 (H) 05/16/2016   CREATININE 1.33 (H) 05/11/2016   IVF Repeat CMET in am  Hyperlipidemia Continue home  statins   Deconditioning OT/PT in view of recent stroke   DVT prophylaxis: SCD's Code Status:   Full Family Communication:  Discussed with patient wife   Disposition Plan: Expect patient to be discharged to home after condition improves Consults called:    GI Admission status:Tele  Obs   Aasia Peavler E, PA-C [facility]   If 7PM-7AM, please contact night-coverage [email or web address] Password [facility]  05/21/2016, 6:04 PM

## 2016-05-21 NOTE — ED Provider Notes (Signed)
[facility] DEPT Provider Note   CSN: [id number] Arrival date & time: 05/21/16  1216     History   Chief Complaint Chief Complaint  Patient presents with  . Rectal Bleeding    HPI TOD Richard [name] is a 82 y.o. male.  The history is provided by the patient and medical records. No language interpreter was used.  Rectal Bleeding  Associated symptoms: no abdominal pain, no fever and no vomiting    Richard [name] is a 82 y.o. male  with a PMH of afib on xarelto with pacemaker, HTN, HLD, DM, recently admitted after likely stroke on 9/11 who presents to the Emergency Department complaining of dark stools that he first noticed last night. Patient endorses associated weakness over the last several days as well. Upon discharge from hospital on 9/14, patient was started on ASA on top of his usual Xarelto. Took Xarelto last night, last took aspirin this morning. Patient saw his primary care provider this morning where blood work was drawn and he had a hemoglobin of 6.9. He was told to come to ED for further evaluation. Denies abdominal pain, nausea, vomiting. Followed by [facility].   Past Medical History:  Diagnosis Date  . Anxiety   . Arthritis   . Atrial fibrillation ([place])    a. Dx 03/2013, notes report atrial fibrillation/atrial flutter, placed on amiodarone. NSR in subsequent OV's.  . B12 deficiency anemia   . Chest pain    a. H/o CTA negative for PE 2012, normal cath 2005, normal nucs previously including 05/2012.  Marland Kitchen Complete heart block (HCC)    a. s/p Medtronic Adapta L model ADDRL 1 (serial number [id number]) pacemaker.  Marland Kitchen GERD (gastroesophageal reflux disease)   . Hiatal hernia   . Hyperlipidemia   . Hypertension   . Iron deficiency anemia   . LBBB (left bundle branch block)   . Microcytic anemia   . Nephrolithiasis   . On home oxygen therapy    a. 2L w/CPAP at night  . Orthostatic hypotension   . OSA on CPAP    cpap  . [place] spotted fever ~ 1945  . Sinus  drainage   . Skin cancer of lip   . Type II diabetes mellitus [facility])     Patient Active Problem List   Diagnosis Date Noted  . CKD (chronic kidney disease)   . Type 2 diabetes mellitus with circulatory disorder, without long-term current use of insulin ([place])   . Acute right PCA stroke ([place]) 05/08/2016  . Ataxia due to recent stroke   . Gait disturbance, post-stroke   . Cerebellar stroke, acute ([place]) 05/07/2016  . Hyperlipidemia   . Chronic anticoagulation   . Cardiac pacemaker in situ   . Acute blood loss anemia   . Cognitive deficits   . Cognitive deficit due to recent stroke   . Cognitive deficit due to recent cerebrovascular accident (CVA)   . Generalized weakness 05/05/2016  . Dizziness   . Absolute anemia 02/08/2016  . Weakness 02/01/2016  . Gastric polyp 06/21/2015  . Anemia, iron deficiency   . GERD (gastroesophageal reflux disease)   . B12 deficiency anemia   . Bradycardia with less than 30 beats per minute 05/31/2014  . Complete heart block ([place]) 05/31/2014  . Syncope 05/31/2014  . Anemia 05/31/2014  . Atrial fibrillation ([place]) 04/08/2013  . Diabetes mellitus ([place]) 04/06/2013  . Hiatal hernia 04/06/2013  . Benign essential HTN 06/13/2011  . OSA (obstructive sleep apnea) 06/13/2011  Past Surgical History:  Procedure Laterality Date  . CARDIAC CATHETERIZATION     by Dr. Acie Fredrickson, January 24, 2004, that shows minimal coronary artery irregularities and normal left ventricular function  . CATARACT EXTRACTION W/ INTRAOCULAR LENS  IMPLANT, BILATERAL Bilateral   . ESOPHAGOGASTRODUODENOSCOPY (EGD) WITH PROPOFOL N/A 06/21/2015   Procedure: ESOPHAGOGASTRODUODENOSCOPY (EGD) WITH PROPOFOL;  Surgeon: Gatha Mayer, MD;  Location: [facility];  Service: Endoscopy;  Laterality: N/A;  . INGUINAL HERNIA REPAIR Right   . LUMBAR DISC SURGERY  ~ 1993  . PERMANENT PACEMAKER INSERTION N/A 06/03/2014   MDT Adapta L implanted by Dr Rayann Heman for syncope and transient AV block  . SKIN  CANCER EXCISION     "lower lip" (04/06/2013)  . TEMPORARY PACEMAKER INSERTION N/A 05/31/2014   Procedure: TEMPORARY WIRE;  Surgeon: Sinclair Grooms, MD;  Location: [facility] CATH LAB;  Service: Cardiovascular;  Laterality: N/A;  . VASECTOMY     Hx of     OB History    No data available       Home Medications    Prior to Admission medications   Medication Sig Start Date End Date Taking? Authorizing Provider  acetaminophen (TYLENOL) 500 MG tablet Take 500 mg by mouth 2 (two) times daily.     Historical Provider, MD  amiodarone (PACERONE) 200 MG tablet Take 200 mg by mouth 3 (three) times a week. On Monday, Wednesday & Friday    Historical Provider, MD  amLODipine (NORVASC) 10 MG tablet Take 1 tablet (10 mg total) by mouth daily. 05/16/16   Bary Leriche, PA-C  aspirin EC 81 MG EC tablet Take 1 tablet (81 mg total) by mouth daily. 05/16/16   Bary Leriche, PA-C  atorvastatin (LIPITOR) 40 MG tablet Take 1 tablet (40 mg total) by mouth daily at 6 PM. 05/16/16   Ivan Anchors Love, PA-C  cyanocobalamin (,VITAMIN B-12,) 1000 MCG/ML injection Inject 1,000 mcg into the muscle every 30 (thirty) days.    Historical Provider, MD  fluticasone (FLONASE) 50 MCG/ACT nasal spray Place 1 spray into the nose daily as needed for allergies or rhinitis.     Historical Provider, MD  metoprolol (LOPRESSOR) 50 MG tablet TAKE 1 TABLET BY MOUTH TWICE A DAY 07/06/15   Thayer Headings, MD  omeprazole (PRILOSEC) 20 MG capsule Take 20 mg by mouth daily.     Historical Provider, MD  OXYGEN Inhale into the lungs at bedtime. CPAP with OXYGEN    Historical Provider, MD  pioglitazone (ACTOS) 30 MG tablet Take 30 mg by mouth daily.      Historical Provider, MD  senna-docusate (SENOKOT-S) 8.6-50 MG tablet Take 2 tablets by mouth 2 (two) times daily. For constipation 05/16/16   Ivan Anchors Love, PA-C  sertraline (ZOLOFT) 100 MG tablet Take 1 tablet by mouth daily. 08/21/14   Historical Provider, MD  XARELTO 20 MG TABS tablet TAKE 1 TABLET (20  MG TOTAL) BY MOUTH DAILY WITH SUPPER. 07/23/15   Thayer Headings, MD    Family History Family History  Problem Relation Age of Onset  . Thyroid disease Mother     goiter  . Pneumonia Father   . Other      Parents both died of old age; other conditions not known  . Stroke Brother   . Breast cancer Sister     Social History Social History  Substance Use Topics  . Smoking status: Former Smoker    Packs/day: 0.00    Years: 20.00  Types: Cigarettes  . Smokeless tobacco: Never Used     Comment: 04/06/2013 "quit smoking years ago; didn't smoke that much; probably 20 years; 1ppd"  . Alcohol use No     Allergies   Codeine and Citalopram   Review of Systems Review of Systems  Constitutional: Negative for fever.  HENT: Negative for congestion.   Eyes: Negative for visual disturbance.  Respiratory: Negative for cough and shortness of breath.   Cardiovascular: Negative.   Gastrointestinal: Positive for blood in stool and hematochezia. Negative for abdominal pain, nausea and vomiting.  Genitourinary: Negative for dysuria.  Musculoskeletal: Negative for back pain and neck pain.  Skin: Negative for color change.  Neurological: Positive for weakness. Negative for headaches.     Physical Exam Updated Vital Signs BP 153/68 (BP Location: Right Arm)   Pulse 65   Temp 97.9 F (36.6 C) (Oral)   Resp 13   Ht 6\' 3"  (1.905 m)   Wt 110.7 kg   SpO2 99%   BMI 30.50 kg/m   Physical Exam  Constitutional: He is oriented to person, place, and time. He appears well-developed and well-nourished. No distress.  HENT:  Head: Normocephalic and atraumatic.  Cardiovascular: Normal rate, regular rhythm and intact distal pulses.   Pulmonary/Chest: Effort normal and breath sounds normal. No respiratory distress. He has no wheezes. He has no rales.  Abdominal: Soft. Bowel sounds are normal. He exhibits no distension. There is no tenderness.  Genitourinary:  Genitourinary Comments: Dark tarry  stool on rectal exam.   Neurological: He is alert and oriented to person, place, and time.  Skin: Skin is warm and dry.  Nursing note and vitals reviewed.    ED Treatments / Results  Labs (all labs ordered are listed, but only abnormal results are displayed) Labs Reviewed  COMPREHENSIVE METABOLIC PANEL - Abnormal; Notable for the following:       Result Value   Glucose, Bld 119 (*)    BUN 28 (*)    Creatinine, Ser 1.35 (*)    Total Protein 6.1 (*)    Albumin 3.2 (*)    ALT 15 (*)    GFR calc non Af Amer 47 (*)    GFR calc Af Amer 54 (*)    All other components within normal limits  CBC - Abnormal; Notable for the following:    RBC 2.34 (*)    Hemoglobin 6.9 (*)    HCT 23.0 (*)    RDW 17.0 (*)    All other components within normal limits  PROTIME-INR - Abnormal; Notable for the following:    Prothrombin Time 16.5 (*)    All other components within normal limits  POC OCCULT BLOOD, ED - Abnormal; Notable for the following:    Fecal Occult Bld POSITIVE (*)    All other components within normal limits  APTT  DIFFERENTIAL  TYPE AND SCREEN  PREPARE RBC (CROSSMATCH)    EKG  EKG Interpretation  Date/Time:  Wednesday May 21 2016 13:19:46 EDT Ventricular Rate:  64 PR Interval:    QRS Duration: 169 QT Interval:  491 QTC Calculation: 507 R Axis:   10 Text Interpretation:  Sinus rhythm Prolonged PR interval Left bundle branch block No significant change was found Confirmed by Wyvonnia Dusky  MD, STEPHEN [id number]) on 05/21/2016 1:32:05 PM       Radiology No results found.  Procedures Procedures (including critical care time)  Medications Ordered in ED Medications  0.9 %  sodium chloride infusion (not administered)  pantoprazole (  PROTONIX) injection 40 mg (40 mg Intravenous Given 05/21/16 1420)     Initial Impression / Assessment and Plan / ED Course  I have reviewed the triage vital signs and the nursing notes.  Pertinent labs & imaging results that were available  during my care of the patient were reviewed by me and considered in my medical decision making (see chart for details).  Clinical Course   ALGENE GLAWE is a 82 y.o. male who was sent to ED by PCP for HGB of 6.9 and dark tarry stools that began yesterday associated with generalized weakness. Patient admitted for likely stroke on 9/11 while on Xarelto so ASA was added to regimen. Followed by Dr. Carlean Purl, [facility]. On exam, patient is hemodynamically stable.   Labs: + Hemoccult. Hemoglobin of 6.9. CMP with elevated BUN and creatinine.  Therapeutics: Protonix. Will transfuse 1U   Consults: 1:30 PM - [facility], PA Azucena Freed; consult to hospitalist who will admit.    Patient seen by and discussed with Dr. Wyvonnia Dusky who agrees with treatment plan.   Final Clinical Impressions(s) / ED Diagnoses   Final diagnoses:  None    New Prescriptions New Prescriptions   No medications on file     [facility] Sarabeth Benton, PA-C 05/21/16 [place], MD 05/21/16 2010

## 2016-05-21 NOTE — Progress Notes (Signed)
MD paged to clarify blood transfusion order. Order placed for 1 unit of blood to be transfused at 2137.

## 2016-05-21 NOTE — ED Notes (Signed)
Report called  

## 2016-05-22 ENCOUNTER — Observation Stay (HOSPITAL_COMMUNITY): Payer: Medicare Other | Admitting: Anesthesiology

## 2016-05-22 ENCOUNTER — Encounter (HOSPITAL_COMMUNITY)
Admission: EM | Disposition: A | Discharge status: Home Health | Payer: Self-pay | Source: Home / Self Care | Attending: Internal Medicine

## 2016-05-22 ENCOUNTER — Encounter (HOSPITAL_COMMUNITY): Payer: Self-pay | Admitting: *Deleted

## 2016-05-22 DIAGNOSIS — K921 Melena: Secondary | ICD-10-CM | POA: Diagnosis not present

## 2016-05-22 DIAGNOSIS — I4891 Unspecified atrial fibrillation: Secondary | ICD-10-CM | POA: Diagnosis present

## 2016-05-22 DIAGNOSIS — D519 Vitamin B12 deficiency anemia, unspecified: Secondary | ICD-10-CM | POA: Diagnosis present

## 2016-05-22 DIAGNOSIS — D125 Benign neoplasm of sigmoid colon: Secondary | ICD-10-CM | POA: Diagnosis not present

## 2016-05-22 DIAGNOSIS — I119 Hypertensive heart disease without heart failure: Secondary | ICD-10-CM | POA: Diagnosis not present

## 2016-05-22 DIAGNOSIS — Z87891 Personal history of nicotine dependence: Secondary | ICD-10-CM | POA: Diagnosis not present

## 2016-05-22 DIAGNOSIS — D122 Benign neoplasm of ascending colon: Secondary | ICD-10-CM | POA: Diagnosis not present

## 2016-05-22 DIAGNOSIS — D62 Acute posthemorrhagic anemia: Secondary | ICD-10-CM | POA: Diagnosis not present

## 2016-05-22 DIAGNOSIS — I10 Essential (primary) hypertension: Secondary | ICD-10-CM | POA: Diagnosis not present

## 2016-05-22 DIAGNOSIS — K648 Other hemorrhoids: Secondary | ICD-10-CM | POA: Diagnosis present

## 2016-05-22 DIAGNOSIS — E785 Hyperlipidemia, unspecified: Secondary | ICD-10-CM | POA: Diagnosis not present

## 2016-05-22 DIAGNOSIS — I482 Chronic atrial fibrillation: Secondary | ICD-10-CM | POA: Diagnosis not present

## 2016-05-22 DIAGNOSIS — G4733 Obstructive sleep apnea (adult) (pediatric): Secondary | ICD-10-CM | POA: Diagnosis present

## 2016-05-22 DIAGNOSIS — I447 Left bundle-branch block, unspecified: Secondary | ICD-10-CM | POA: Diagnosis present

## 2016-05-22 DIAGNOSIS — G4730 Sleep apnea, unspecified: Secondary | ICD-10-CM | POA: Diagnosis not present

## 2016-05-22 DIAGNOSIS — E1159 Type 2 diabetes mellitus with other circulatory complications: Secondary | ICD-10-CM | POA: Diagnosis not present

## 2016-05-22 DIAGNOSIS — K254 Chronic or unspecified gastric ulcer with hemorrhage: Secondary | ICD-10-CM | POA: Diagnosis not present

## 2016-05-22 DIAGNOSIS — Z6830 Body mass index (BMI) 30.0-30.9, adult: Secondary | ICD-10-CM | POA: Diagnosis not present

## 2016-05-22 DIAGNOSIS — Z950 Presence of cardiac pacemaker: Secondary | ICD-10-CM | POA: Diagnosis not present

## 2016-05-22 DIAGNOSIS — K922 Gastrointestinal hemorrhage, unspecified: Secondary | ICD-10-CM

## 2016-05-22 DIAGNOSIS — Z8719 Personal history of other diseases of the digestive system: Secondary | ICD-10-CM | POA: Diagnosis not present

## 2016-05-22 DIAGNOSIS — K219 Gastro-esophageal reflux disease without esophagitis: Secondary | ICD-10-CM | POA: Diagnosis not present

## 2016-05-22 DIAGNOSIS — D120 Benign neoplasm of cecum: Secondary | ICD-10-CM | POA: Diagnosis not present

## 2016-05-22 DIAGNOSIS — K317 Polyp of stomach and duodenum: Secondary | ICD-10-CM | POA: Diagnosis present

## 2016-05-22 DIAGNOSIS — D509 Iron deficiency anemia, unspecified: Secondary | ICD-10-CM | POA: Diagnosis not present

## 2016-05-22 DIAGNOSIS — K449 Diaphragmatic hernia without obstruction or gangrene: Secondary | ICD-10-CM | POA: Diagnosis present

## 2016-05-22 DIAGNOSIS — K5730 Diverticulosis of large intestine without perforation or abscess without bleeding: Secondary | ICD-10-CM | POA: Diagnosis present

## 2016-05-22 DIAGNOSIS — N179 Acute kidney failure, unspecified: Secondary | ICD-10-CM | POA: Diagnosis not present

## 2016-05-22 DIAGNOSIS — I129 Hypertensive chronic kidney disease with stage 1 through stage 4 chronic kidney disease, or unspecified chronic kidney disease: Secondary | ICD-10-CM | POA: Diagnosis not present

## 2016-05-22 DIAGNOSIS — I69393 Ataxia following cerebral infarction: Secondary | ICD-10-CM | POA: Diagnosis not present

## 2016-05-22 HISTORY — PX: ESOPHAGOGASTRODUODENOSCOPY: SHX5428

## 2016-05-22 LAB — GLUCOSE, CAPILLARY
Glucose-Capillary: 147 mg/dL — ABNORMAL HIGH (ref 65–99)
Glucose-Capillary: 123 mg/dL — ABNORMAL HIGH (ref 65–99)
Glucose-Capillary: 115 mg/dL — ABNORMAL HIGH (ref 65–99)
Glucose-Capillary: 146 mg/dL — ABNORMAL HIGH (ref 65–99)

## 2016-05-22 LAB — CBC
HCT: 25.5 % — ABNORMAL LOW (ref 39.0–52.0)
Hemoglobin: 8.1 g/dL — ABNORMAL LOW (ref 13.0–17.0)
MCH: 30 pg (ref 26.0–34.0)
MCHC: 31.8 g/dL (ref 30.0–36.0)
MCV: 94.4 fL (ref 78.0–100.0)
PLATELETS: 146 10*3/uL — AB (ref 150–400)
RBC: 2.7 MIL/uL — AB (ref 4.22–5.81)
RDW: 17.5 % — AB (ref 11.5–15.5)
WBC: 4.5 10*3/uL (ref 4.0–10.5)

## 2016-05-22 LAB — COMPREHENSIVE METABOLIC PANEL
ALT: 15 U/L — AB (ref 17–63)
AST: 20 U/L (ref 15–41)
Albumin: 3 g/dL — ABNORMAL LOW (ref 3.5–5.0)
Alkaline Phosphatase: 59 U/L (ref 38–126)
Anion gap: 8 (ref 5–15)
BILIRUBIN TOTAL: 0.9 mg/dL (ref 0.3–1.2)
BUN: 15 mg/dL (ref 6–20)
CALCIUM: 8.4 mg/dL — AB (ref 8.9–10.3)
CHLORIDE: 107 mmol/L (ref 101–111)
CO2: 25 mmol/L (ref 22–32)
CREATININE: 1.08 mg/dL (ref 0.61–1.24)
Glucose, Bld: 122 mg/dL — ABNORMAL HIGH (ref 65–99)
Potassium: 3.9 mmol/L (ref 3.5–5.1)
Sodium: 140 mmol/L (ref 135–145)
TOTAL PROTEIN: 6 g/dL — AB (ref 6.5–8.1)

## 2016-05-22 SURGERY — EGD (ESOPHAGOGASTRODUODENOSCOPY)
Anesthesia: Monitor Anesthesia Care

## 2016-05-22 MED ORDER — PROPOFOL 10 MG/ML IV BOLUS
INTRAVENOUS | Status: DC | PRN
  Administered 2016-05-22: 20 mg via INTRAVENOUS

## 2016-05-22 MED ORDER — SODIUM CHLORIDE 0.9 % IV SOLN: INTRAVENOUS | Status: DC

## 2016-05-22 MED ORDER — BUTAMBEN-TETRACAINE-BENZOCAINE 2-2-14 % EX AERO
INHALATION_SPRAY | CUTANEOUS | Status: DC | PRN
  Administered 2016-05-22: 2 via TOPICAL

## 2016-05-22 MED ORDER — POLYETHYLENE GLYCOL 3350 17 GM/SCOOP PO POWD: 1.0000 | Freq: Once | ORAL | Status: DC

## 2016-05-22 MED ORDER — SODIUM CHLORIDE 0.9 % IV SOLN
INTRAVENOUS | Status: DC
  Administered 2016-05-22: 18:00:00 via INTRAVENOUS

## 2016-05-22 MED ORDER — FLUTICASONE PROPIONATE 50 MCG/ACT NA SUSP
1.0000 | Freq: Every day | NASAL | Status: DC
  Administered 2016-05-24: 1 via NASAL
  Filled 2016-05-22: qty 16

## 2016-05-22 MED ORDER — LACTATED RINGERS IV SOLN
INTRAVENOUS | Status: DC
Start: 2016-05-22 — End: 2016-05-22
  Administered 2016-05-22: 1000 mL via INTRAVENOUS
  Administered 2016-05-22: 14:00:00 via INTRAVENOUS

## 2016-05-22 MED ORDER — LIDOCAINE 2% (20 MG/ML) 5 ML SYRINGE
INTRAMUSCULAR | Status: DC | PRN
  Administered 2016-05-22: 60 mg via INTRAVENOUS

## 2016-05-22 MED ORDER — MIDAZOLAM HCL 2 MG/2ML IJ SOLN: 0.5000 mg | Freq: Once | INTRAMUSCULAR | Status: DC | PRN

## 2016-05-22 MED ORDER — PROPOFOL 500 MG/50ML IV EMUL
INTRAVENOUS | Status: DC | PRN
  Administered 2016-05-22: 50 ug/kg/min via INTRAVENOUS

## 2016-05-22 MED ORDER — MEPERIDINE HCL 25 MG/ML IJ SOLN
6.2500 mg | INTRAMUSCULAR | Status: DC | PRN
Start: 2016-05-22 — End: 2016-05-23

## 2016-05-22 MED ORDER — METOCLOPRAMIDE HCL 5 MG/ML IJ SOLN
10.0000 mg | Freq: Once | INTRAMUSCULAR | Status: AC
Start: 2016-05-22 — End: 2016-05-22
  Administered 2016-05-22: 10 mg via INTRAVENOUS
  Filled 2016-05-22: qty 2

## 2016-05-22 MED ORDER — PROMETHAZINE HCL 25 MG/ML IJ SOLN
6.2500 mg | INTRAMUSCULAR | Status: DC | PRN
Start: 2016-05-22 — End: 2016-05-23

## 2016-05-22 MED ORDER — PANTOPRAZOLE SODIUM 40 MG PO TBEC
40.0000 mg | DELAYED_RELEASE_TABLET | Freq: Every day | ORAL | Status: DC
  Administered 2016-05-24: 40 mg via ORAL
  Filled 2016-05-22: qty 1

## 2016-05-22 MED ORDER — BISACODYL 10 MG RE SUPP
10.0000 mg | Freq: Once | RECTAL | Status: AC
  Administered 2016-05-22: 10 mg via RECTAL
  Filled 2016-05-22: qty 1

## 2016-05-22 MED ORDER — ISOSORBIDE MONONITRATE ER 30 MG PO TB24
15.0000 mg | ORAL_TABLET | Freq: Every day | ORAL | Status: DC
  Administered 2016-05-22: 15 mg via ORAL
  Filled 2016-05-22: qty 1

## 2016-05-22 MED ORDER — POLYETHYLENE GLYCOL 3350 17 GM/SCOOP PO POWD
1.0000 | Freq: Once | ORAL | Status: AC
  Administered 2016-05-22: 255 g via ORAL
  Filled 2016-05-22: qty 255

## 2016-05-22 MED ORDER — METOCLOPRAMIDE HCL 5 MG/ML IJ SOLN
10.0000 mg | Freq: Once | INTRAMUSCULAR | Status: AC
  Administered 2016-05-23: 10 mg via INTRAVENOUS
  Filled 2016-05-22: qty 2

## 2016-05-22 NOTE — Progress Notes (Signed)
  OT Cancellation Note  Patient Details Name: Richard [name] MRN: [id number] DOB: [birth year]   Cancelled Treatment:    Reason Eval/Treat Not Completed: Other (comment). Pt stating that he is having a procedure at 2 and wants to wait until after the procedure, "I haven't been able to eat anything and I'm weak as water." Pt also verbalizing that he has [place] therapies that just started prior to this admission. Will reattempt after procedure as schedule permits.   Clover Mealy OTR/L Pager: [phone]  05/22/2016, 12:08 PM

## 2016-05-22 NOTE — Anesthesia Preprocedure Evaluation (Signed)
Anesthesia Evaluation  Patient identified by MRN, date of birth, ID band Patient awake    Reviewed: Allergy & Precautions, NPO status , Patient's Chart, lab work & pertinent test results  Airway Mallampati: II  TM Distance: >3 FB Neck ROM: Full    Dental  (+) Edentulous Upper, Edentulous Lower   Pulmonary sleep apnea, Continuous Positive Airway Pressure Ventilation and Oxygen sleep apnea , COPD, former smoker,    breath sounds clear to auscultation       Cardiovascular hypertension, + dysrhythmias Atrial Fibrillation + pacemaker (for SSS/ complete heart block)  Rhythm:Regular Rate:Normal     Neuro/Psych CVA (uses walker), Residual Symptoms    GI/Hepatic GERD  Medicated and Controlled,  Endo/Other  diabetes (glu 123), Oral Hypoglycemic AgentsMorbid obesity  Renal/GU      Musculoskeletal   Abdominal (+) + obese,   Peds  Hematology  (+) Blood dyscrasia (Hb 8.1, Xarelto), ,   Anesthesia Other Findings   Reproductive/Obstetrics                             Anesthesia Physical Anesthesia Plan  ASA: III  Anesthesia Plan: MAC   Post-op Pain Management:    Induction: Intravenous  Airway Management Planned: Natural Airway and Nasal Cannula  Additional Equipment:   Intra-op Plan:   Post-operative Plan:   Informed Consent: I have reviewed the patients History and Physical, chart, labs and discussed the procedure including the risks, benefits and alternatives for the proposed anesthesia with the patient or authorized representative who has indicated his/her understanding and acceptance.   Consent reviewed with POA  Plan Discussed with: CRNA and Surgeon  Anesthesia Plan Comments: (Plan routine monitors, MAC)        Anesthesia Quick Evaluation

## 2016-05-22 NOTE — Transfer of Care (Signed)
Immediate Anesthesia Transfer of Care Note  Patient: Richard [name]  Procedure(s) Performed: Procedure(s): ESOPHAGOGASTRODUODENOSCOPY (EGD) (N/A)  Patient Location: PACU and Endoscopy Unit  Anesthesia Type:MAC  Level of Consciousness: awake, alert , oriented and patient cooperative  Airway & Oxygen Therapy: Patient Spontanous Breathing and Patient connected to nasal cannula oxygen  Post-op Assessment: Report given to RN, Post -op Vital signs reviewed and stable and Patient moving all extremities  Post vital signs: Reviewed and stable  Last Vitals:  Vitals:   05/22/16 0459 05/22/16 1328  BP: (!) 171/79 (!) 165/67  Pulse: 91   Resp: 20 13  Temp: 36.6 C 36.5 C    Last Pain:  Vitals:   05/22/16 1328  TempSrc: Oral  PainSc:          Complications: No apparent anesthesia complications

## 2016-05-22 NOTE — Progress Notes (Signed)
During blood transfusion pt IV started leaking for approximately 15 minutes. New IV started and blood transfusion completed.

## 2016-05-22 NOTE — Anesthesia Postprocedure Evaluation (Signed)
Anesthesia Post Note  Patient: Richard [name]  Procedure(s) Performed: Procedure(s) (LRB): ESOPHAGOGASTRODUODENOSCOPY (EGD) (N/A)  Patient location during evaluation: Endoscopy Anesthesia Type: MAC Level of consciousness: awake and alert, obtunded/minimal responses and oriented Pain management: pain level controlled Vital Signs Assessment: post-procedure vital signs reviewed and stable Respiratory status: spontaneous breathing, nonlabored ventilation and respiratory function stable Cardiovascular status: blood pressure returned to baseline and stable Postop Assessment: no signs of nausea or vomiting Anesthetic complications: no    Last Vitals:  Vitals:   05/22/16 1505 05/22/16 1510  BP: (!) 140/54   Pulse: 67 67  Resp: 13 16  Temp: 36.6 C     Last Pain:  Vitals:   05/22/16 1505  TempSrc: Oral  PainSc:                  Richard [name],E. Ghassan Coggeshall

## 2016-05-22 NOTE — Progress Notes (Signed)
Daily Rounding Note  05/22/2016, 8:39 AM  LOS: 0 days   SUBJECTIVE:   Chief complaint: tarry stool and weakness.  No further BMs, tarry or normal, since AM yesterday.  He is hungry.  Wants to be able to use his home Flonase nasal spray for nasal congestion.  The CPAP sometimes congests him.   OBJECTIVE:         Vital signs in last 24 hours:    Temp:  [97.5 F (36.4 C)-99 F (37.2 C)] 97.8 F (36.6 C) (09/28 0459) Pulse Rate:  [60-91] 91 (09/28 0459) Resp:  [11-26] 20 (09/28 0459) BP: (132-174)/(60-143) 171/79 (09/28 0459) SpO2:  [89 %-100 %] 94 % (09/28 0459) Weight:  [110.7 kg (244 lb)] 110.7 kg (244 lb) (09/27 1224) Last BM Date: 05/21/16 Filed Weights   05/21/16 1224  Weight: 110.7 kg (244 lb)   General: pleasant, chronically unwell and frail looking   Heart: RRR Chest: clear bil.  Good BS and no cough or dyspnea Abdomen: soft, active BS, NT, ND  Extremities: no CCE Neuro/Psych:  Pleasant, oriented x 3, alert, HOH.    Intake/Output from previous day: 09/27 0701 - 09/28 0700 In: 1562.5 [I.V.:567.5; Blood:995] Out: 1700 [Urine:1700]  Intake/Output this shift: Total I/O In: -  Out: 450 [Urine:450]  Lab Results:  Recent Labs  05/21/16 1226 05/22/16 0540  WBC 6.7 4.5  HGB 6.9* 8.1*  HCT 23.0* 25.5*  PLT 205 146*   BMET  Recent Labs  05/21/16 1226 05/22/16 0540  NA 141 140  K 4.2 3.9  CL 107 107  CO2 25 25  GLUCOSE 119* 122*  BUN 28* 15  CREATININE 1.35* 1.08  CALCIUM 8.9 8.4*   LFT  Recent Labs  05/21/16 1226 05/22/16 0540  PROT 6.1* 6.0*  ALBUMIN 3.2* 3.0*  AST 21 20  ALT 15* 15*  ALKPHOS 65 59  BILITOT 0.5 0.9   PT/INR  Recent Labs  05/21/16 1226  LABPROT 16.5*  INR 1.32   Hepatitis Panel No results for input(s): HEPBSAG, HCVAB, HEPAIGM, HEPBIGM in the last 72 hours.  Studies/Results: No results found.   Scheduled Meds: . sodium chloride   Intravenous Once  .  acetaminophen  500 mg Oral BID  . amiodarone  200 mg Oral Once per day on Mon Wed Fri  . amLODipine  10 mg Oral Daily  . atorvastatin  40 mg Oral q1800  . insulin aspart  0-9 Units Subcutaneous TID WC  . metoprolol  50 mg Oral BID  . pantoprazole (PROTONIX) IV  40 mg Intravenous Once  . pantoprazole (PROTONIX) IV  40 mg Intravenous Q12H  . sertraline  100 mg Oral Daily  . sodium chloride flush  3 mL Intravenous Q12H   Continuous Infusions: . sodium chloride 75 mL/hr at 05/21/16 1834   PRN Meds:.acetaminophen **OR** acetaminophen, bisacodyl, magnesium citrate, ondansetron **OR** ondansetron (ZOFRAN) IV, senna-docusate   ASSESMENT:   *  Acute anemia, FOBT positive, melenic type stool. Suspect upper GI bleed. History of iron deficiency anemia and FOBT positive. Hx of both adenomatous and hyperplastic gastric polyps as well as hyper plastic colon polyps. S/p PRBCs x 2.  Some blood leaked from IV site during transfusion so did not get 100% of that unit.  Hgb up appropriately  *  Azotemia, mild AKI.  Resolved.   *  Chronic xarelto, newer daily low dose ASA.  On hold.  *  OSA.  On nocturnal CPAP.  Nasal congestion complaint   PLAN   * EGD set for today at 2 PM.    * Hgb in AM  *  Ordered the Flonase spray.     Azucena Freed  05/22/2016, 8:39 AM Pager: [phone]

## 2016-05-22 NOTE — Progress Notes (Signed)
PROGRESS NOTE    Richard [name]  [id number] DOB: 1931/01/12 DOA: 05/21/2016 PCP: Tivis Ringer, MD   Brief Narrative:  Richard [name] is an 82 year old gentleman with a past medical history of CVA, A. fib, had been on Xarelto and aspirin presented to the emergency department 05/21/2016 with complaints of generalized weakness as well as having black stools. Labs showed hemoglobin of 6.9, previously 11.4 on 05/09/2016. He was typed and cross and transfuse with 2 units packed red blood cells with repeat hemoglobin 8.1. Upper GI bleed was suspected as GI was consulted, evaluated by Dr. Hilarie Fredrickson. On 05/22/2016 he underwent upper endoscopy, procedure performed by Dr. Hilarie Fredrickson, procedure showing normal esophagus, large hiatal hernia, nonbleeding erosive gastropathy. GI recommended avoiding NSAIDs and continue with daily proton pump inhibitor therapy.   Assessment & Plan:   Active Problems:   Benign essential HTN   Diabetes mellitus (HCC)   Atrial fibrillation (HCC)   GERD (gastroesophageal reflux disease)   Generalized weakness   Cerebellar stroke, acute (HCC)   Hyperlipidemia   Cardiac pacemaker in situ   Type 2 diabetes mellitus with circulatory disorder, without long-term current use of insulin (HCC)   GIB (gastrointestinal bleeding)   Diabetes mellitus with complication (HCC)   Upper GI bleed   Melena  1.  Upper GI bleed -Richard [name] is an 82 year old gentleman having a history of A. fib and CVA, had been on both aspirin and Xarelto, presented with black tarry stools, highly suggestive of upper GI bleed. -GI was consulted as he underwent EGD on 05/22/2016, procedure performed by Dr. Hilarie Fredrickson that revealed nonbleeding erosive gastropathy. -Will need to avoid NSAIDs therapy -Continue Protonix 40 mg by mouth daily  2.  Acute blood loss anemia -Richard [name] presented with symptomatic anemia as labs revealed hemoglobin of 6.9 -Likely precipitated by upper GI bleed, he was typed and crossed  and transfused with 2 units of packed red blood cells overnight with repeat hemoglobin 8.1 -On 05/14/2016 reports feeling much better, follow CBC  3.  History of CVA -He has a history of A. fib and had been anticoagulated with Xarelto -Presenting with symptomatic anemia having hemoglobin of 6.9 likely resulting from a GI bleed anticoagulation and antiplatelet therapy discontinued on admission -For now continue holding anticoagulation and repeat labs in a.m.  4.  Atrial fibrillation -He is currently in sinus rhythm with ventricular rates in the 60s -As mentioned above he presents with GI bleed and symptomatic anemia for which Xarelto was held on admission -Continue amiodarone 200 mg 3 times weekly and metoprolol 50 mg by mouth twice a day  5.  Hypertension -He is on metoprolol titrate 50 mg twice a day along with Norvasc 10 mg by mouth daily -Systolic blood pressures have been ranging from 140's-170's -Will add Imdur 15 mg PO q daily for tighter blood pressure control   DVT prophylaxis: SCD's Code Status: Full Code Family Communication: I spoke to his wife Disposition Plan:   Consultants:   GI  Procedures:   EGD Impression:       - Normal esophagus.                           - Large hiatal hernia.                           - Non-bleeding erosive gastropathy.                           -  A single, nonbleeding, gastric polyp at incisura.                            Resection not attempted.                           - A single, nonbleeding, gastric polyp in the                            antrum (previously biopsied in Oct 2016 and found                            to be adenoma without high grade dysplasia or                            cancer). Resection not attempted.                           - Normal examined duodenum.   Subjective: Reports feeling better after receiving blood transfusion   Objective: Vitals:   05/22/16 0459 05/22/16 1328 05/22/16 1505 05/22/16 1510  BP:  (!) 171/79 (!) 165/67 (!) 140/54   Pulse: 91  67 67  Resp: 20 13 13 16   Temp: 97.8 F (36.6 C) 97.7 F (36.5 C) 97.9 F (36.6 C)   TempSrc: Oral Oral Oral   SpO2: 94% 96% 99% 98%  Weight:      Height:        Intake/Output Summary (Last 24 hours) at 05/22/16 1610 Last data filed at 05/22/16 1452  Gross per 24 hour  Intake           1332.5 ml  Output             2200 ml  Net           -867.5 ml   Filed Weights   05/21/16 1224  Weight: 110.7 kg (244 lb)    Examination:  General exam: Appears calm and comfortable, no acute distress  Respiratory system: Clear to auscultation. Respiratory effort normal. Cardiovascular system: S1 & S2 heard, RRR. No JVD, murmurs, rubs, gallops or clicks. No pedal edema. Gastrointestinal system: Abdomen is nondistended, soft and nontender. No organomegaly or masses felt. Normal bowel sounds heard. Central nervous system: Alert and oriented. No focal neurological deficits. Extremities: Symmetric 5 x 5 power. Skin: No rashes, lesions or ulcers Psychiatry: Judgement and insight appear normal. Mood & affect appropriate.     Data Reviewed: I have personally reviewed following labs and imaging studies  CBC:  Recent Labs Lab 05/21/16 1226 05/22/16 0540  WBC 6.7 4.5  NEUTROABS 4.4  --   HGB 6.9* 8.1*  HCT 23.0* 25.5*  MCV 98.3 94.4  PLT 205 123456*   Basic Metabolic Panel:  Recent Labs Lab 05/16/16 1044 05/21/16 1226 05/22/16 0540  NA 138 141 140  K 4.1 4.2 3.9  CL 103 107 107  CO2 27 25 25   GLUCOSE 159* 119* 122*  BUN 20 28* 15  CREATININE 1.36* 1.35* 1.08  CALCIUM 9.1 8.9 8.4*   GFR: Estimated Creatinine Clearance (by C-G formula based on SCr of 1.08 mg/dL) Male: 57.5 mL/min Male: 69.6 mL/min Liver Function Tests:  Recent Labs Lab 05/21/16 1226 05/22/16 0540  AST 21 20  ALT  15* 15*  ALKPHOS 65 59  BILITOT 0.5 0.9  PROT 6.1* 6.0*  ALBUMIN 3.2* 3.0*   No results for input(s): LIPASE, AMYLASE in the last 168  hours. No results for input(s): AMMONIA in the last 168 hours. Coagulation Profile:  Recent Labs Lab 05/21/16 1226  INR 1.32   Cardiac Enzymes: No results for input(s): CKTOTAL, CKMB, CKMBINDEX, TROPONINI in the last 168 hours. BNP (last 3 results) No results for input(s): PROBNP in the last 8760 hours. HbA1C: No results for input(s): HGBA1C in the last 72 hours. CBG:  Recent Labs Lab 05/16/16 2056 05/17/16 0657 05/21/16 2110 05/22/16 0740 05/22/16 1201  GLUCAP 136* 140* 228* 147* 123*   Lipid Profile: No results for input(s): CHOL, HDL, LDLCALC, TRIG, CHOLHDL, LDLDIRECT in the last 72 hours. Thyroid Function Tests: No results for input(s): TSH, T4TOTAL, FREET4, T3FREE, THYROIDAB in the last 72 hours. Anemia Panel: No results for input(s): VITAMINB12, FOLATE, FERRITIN, TIBC, IRON, RETICCTPCT in the last 72 hours. Sepsis Labs: No results for input(s): PROCALCITON, LATICACIDVEN in the last 168 hours.  No results found for this or any previous visit (from the past 240 hour(s)).       Radiology Studies: No results found.      Scheduled Meds: . sodium chloride   Intravenous Once  . acetaminophen  500 mg Oral BID  . amiodarone  200 mg Oral Once per day on Mon Wed Fri  . amLODipine  10 mg Oral Daily  . atorvastatin  40 mg Oral q1800  . fluticasone  1 spray Each Nare Daily  . insulin aspart  0-9 Units Subcutaneous TID WC  . metoCLOPramide (REGLAN) injection  10 mg Intravenous Once   Followed by  . [START ON 05/23/2016] metoCLOPramide (REGLAN) injection  10 mg Intravenous Once  . metoprolol  50 mg Oral BID  . [START ON 05/23/2016] pantoprazole  40 mg Oral Q0600  . polyethylene glycol powder  1 Container Oral Once  . sertraline  100 mg Oral Daily  . sodium chloride flush  3 mL Intravenous Q12H   Continuous Infusions: . sodium chloride 75 mL/hr at 05/22/16 1026     LOS: 0 days    Time spent: 35 min    Kelvin Cellar, MD [facility] Pager  [phone]  If 7PM-7AM, please contact night-coverage [email or web address] Password [email or web address] 05/22/2016, 4:10 PM

## 2016-05-22 NOTE — Care Management Obs Status (Signed)
[place] NOTIFICATION   Patient Details  Name: Richard [name] MRN: [id number] Date of Birth: 1931-05-07   Medicare Observation Status Notification Given:  Yes    Norina Buzzard, RN 05/22/2016, 11:39 AM

## 2016-05-22 NOTE — Care Management Note (Signed)
Case Management Note  Patient Details  Name: Richard [name] MRN: [id number] Date of Birth: 1930/12/04  Subjective/Objective:                 Patient from home with wife, active with [facility] for [facility].    Action/Plan:  Will resume HH with [facility] at DC. Notified Butch Penny, liaison [facility],  of patient in obs status.   Expected Discharge Date:                  Expected Discharge Plan:  [place]  In-House Referral:     Discharge planning Services  CM Consult  Post Acute Care Choice:  [place], Resumption of Svcs/PTA Provider Choice offered to:  Patient  DME Arranged:  N/A DME Agency:  NA  HH Arranged:    [place] Agency:  NA  Status of Service:  In process, will continue to follow  If discussed at Long Length of Stay Meetings, dates discussed:    Additional Comments:  Carles Collet, RN 05/22/2016, 10:53 AM

## 2016-05-22 NOTE — Op Note (Signed)
[facility] Patient Name: Richard [name] Procedure Date : 05/22/2016 MRN: [id number] Attending MD: Jerene Bears , MD Date of Birth: 11-24-1930 CSN: [id number] Age: 82 Admit Type: Inpatient Procedure:                Upper GI endoscopy Indications:              Acute post hemorrhagic anemia, Melena Providers:                Lajuan Lines. Hilarie Fredrickson, MD, Dustin Flock RN, RN, Despina Pole Tech, Technician, Rebekah Chesterfield, CRNA Referring MD:             [facility] Medicines:                Monitored Anesthesia Care Complications:            No immediate complications. Estimated Blood Loss:     Estimated blood loss: none. Procedure:                Pre-Anesthesia Assessment:                           - Prior to the procedure, a History and Physical                            was performed, and patient medications and                            allergies were reviewed. The patient's tolerance of                            previous anesthesia was also reviewed. The risks                            and benefits of the procedure and the sedation                            options and risks were discussed with the patient.                            All questions were answered, and informed consent                            was obtained. Prior Anticoagulants: The patient has                            taken Xarelto (rivaroxaban), last dose was 2 days                            prior to procedure. ASA Grade Assessment: III - A                            patient with severe systemic disease. After  reviewing the risks and benefits, the patient was                            deemed in satisfactory condition to undergo the                            procedure.                           After obtaining informed consent, the endoscope was                            passed under direct vision. Throughout the     procedure, the patient's blood pressure, pulse, and                            oxygen saturations were monitored continuously. The                            [id number]) scope was introduced through the                            mouth, and advanced to the third part of duodenum.                            The upper GI endoscopy was accomplished without                            difficulty. The patient tolerated the procedure                            well. Scope In: Scope Out: Findings:      The examined esophagus was normal.      A large hiatal hernia was found. The proximal extent of the gastric       folds (end of tubular esophagus) was 35 cm from the incisors. The hiatal       narrowing was 44 cm from the incisors. There was no evidence of       Cameron's lesions.      A few, diminutive non-bleeding erosions were found in the cardia and in       the gastric fundus.      A single 4 mm sessile polyp with no bleeding and no stigmata of recent       bleeding was found at the incisura. Polypectomy was not attempted due to       recent GI bleeding.      A single 15 mm semi-sessile polyp with no bleeding and no stigmata of       recent bleeding was found in the gastric antrum (forceps used to       manipulate the polyp and ensure no erosion seen at the tip; none seen).       Polypectomy was not attempted due to recent GI bleeding.      The examined duodenum was normal. Impression:               - Normal esophagus.                           -  Large hiatal hernia.                           - Non-bleeding erosive gastropathy.                           - A single, nonbleeding, gastric polyp at incisura.                            Resection not attempted.                           - A single, nonbleeding, gastric polyp in the                            antrum (previously biopsied in Oct 2016 and found                            to be adenoma without high grade dysplasia or                             cancer). Resection not attempted.                           - Normal examined duodenum. Moderate Sedation:      N/A Recommendation:           - Return patient to hospital ward for ongoing care.                           - Avoid aspirin, ibuprofen, naproxen, or other                            non-steroidal anti-inflammatory drugs.                           - Daily PPI                           - Monitor Hgb closely                           - Consider colonoscopy while here                           - Put patient on a clear liquid diet starting today. Procedure Code(s):        --- Professional ---                           [phone], Esophagogastroduodenoscopy, flexible,                            transoral; diagnostic, including collection of                            specimen(s) by brushing or washing, when performed                            (  separate procedure) Diagnosis Code(s):        --- Professional ---                           K44.9, Diaphragmatic hernia without obstruction or                            gangrene                           K31.89, Other diseases of stomach and duodenum                           K31.7, Polyp of stomach and duodenum                           D62, Acute posthemorrhagic anemia                           K92.1, Melena (includes Hematochezia) CPT copyright 2016 American Medical Association. All rights reserved. The codes documented in this report are preliminary and upon coder review may  be revised to meet current compliance requirements. Jerene Bears, MD 05/22/2016 3:22:11 PM This report has been signed electronically. Number of Addenda: 0

## 2016-05-23 ENCOUNTER — Encounter (HOSPITAL_COMMUNITY)
Admission: EM | Disposition: A | Discharge status: Home Health | Payer: Self-pay | Source: Home / Self Care | Attending: Internal Medicine

## 2016-05-23 ENCOUNTER — Encounter: Payer: Self-pay | Admitting: Internal Medicine

## 2016-05-23 ENCOUNTER — Inpatient Hospital Stay (HOSPITAL_COMMUNITY): Payer: Medicare Other | Admitting: Certified Registered Nurse Anesthetist

## 2016-05-23 ENCOUNTER — Encounter (HOSPITAL_COMMUNITY): Payer: Self-pay | Admitting: *Deleted

## 2016-05-23 DIAGNOSIS — D125 Benign neoplasm of sigmoid colon: Secondary | ICD-10-CM

## 2016-05-23 DIAGNOSIS — D120 Benign neoplasm of cecum: Secondary | ICD-10-CM

## 2016-05-23 HISTORY — PX: COLONOSCOPY WITH PROPOFOL: SHX5780

## 2016-05-23 HISTORY — PX: GIVENS CAPSULE STUDY: SHX5432

## 2016-05-23 LAB — BASIC METABOLIC PANEL
Anion gap: 6 (ref 5–15)
BUN: 8 mg/dL (ref 6–20)
CO2: 28 mmol/L (ref 22–32)
Calcium: 8.8 mg/dL — ABNORMAL LOW (ref 8.9–10.3)
Chloride: 105 mmol/L (ref 101–111)
Creatinine, Ser: 1.03 mg/dL (ref 0.61–1.24)
GFR calc Af Amer: >60 mL/min (ref >60)
GFR calc non Af Amer: >60 mL/min (ref >60)
Glucose, Bld: 152 mg/dL — ABNORMAL HIGH (ref 65–99)
Potassium: 3.8 mmol/L (ref 3.5–5.1)
Sodium: 139 mmol/L (ref 135–145)

## 2016-05-23 LAB — CBC
HCT: 26.9 % — ABNORMAL LOW (ref 39.0–52.0)
HEMOGLOBIN: 8.4 g/dL — AB (ref 13.0–17.0)
MCH: 29.6 pg (ref 26.0–34.0)
MCHC: 31.2 g/dL (ref 30.0–36.0)
MCV: 94.7 fL (ref 78.0–100.0)
PLATELETS: 155 10*3/uL (ref 150–400)
RBC: 2.84 MIL/uL — AB (ref 4.22–5.81)
RDW: 17.4 % — AB (ref 11.5–15.5)
WBC: 4.4 10*3/uL (ref 4.0–10.5)

## 2016-05-23 LAB — GLUCOSE, CAPILLARY
GLUCOSE-CAPILLARY: 227 mg/dL — AB (ref 65–99)
Glucose-Capillary: 99 mg/dL (ref 65–99)
Glucose-Capillary: 166 mg/dL — ABNORMAL HIGH (ref 65–99)
Glucose-Capillary: 128 mg/dL — ABNORMAL HIGH (ref 65–99)

## 2016-05-23 SURGERY — COLONOSCOPY
Anesthesia: Monitor Anesthesia Care

## 2016-05-23 SURGERY — COLONOSCOPY WITH PROPOFOL
Anesthesia: Monitor Anesthesia Care

## 2016-05-23 MED ORDER — POLYETHYLENE GLYCOL 3350 17 GM/SCOOP PO POWD
0.5000 | Freq: Once | ORAL | Status: AC
  Administered 2016-05-23: 127.5 g via ORAL
  Filled 2016-05-23: qty 255

## 2016-05-23 MED ORDER — LACTATED RINGERS IV SOLN
INTRAVENOUS | Status: DC
  Administered 2016-05-23 (×2): via INTRAVENOUS

## 2016-05-23 MED ORDER — PROPOFOL 10 MG/ML IV BOLUS
INTRAVENOUS | Status: DC | PRN
  Administered 2016-05-23: 20 mg via INTRAVENOUS

## 2016-05-23 MED ORDER — POLYETHYLENE GLYCOL 3350 17 G PO PACK: 17.0000 g | PACK | Freq: Once | ORAL | Status: DC

## 2016-05-23 MED ORDER — ISOSORBIDE MONONITRATE ER 30 MG PO TB24
30.0000 mg | ORAL_TABLET | Freq: Every day | ORAL | Status: DC
  Administered 2016-05-24: 30 mg via ORAL
  Filled 2016-05-23: qty 1

## 2016-05-23 MED ORDER — POLYETHYLENE GLYCOL 3350 17 G PO PACK: 8.5000 g | PACK | Freq: Once | ORAL | Status: DC

## 2016-05-23 MED ORDER — METOCLOPRAMIDE HCL 5 MG/ML IJ SOLN
10.0000 mg | Freq: Once | INTRAMUSCULAR | Status: AC
  Administered 2016-05-23: 10 mg via INTRAVENOUS
  Filled 2016-05-23: qty 2

## 2016-05-23 MED ORDER — PROPOFOL 500 MG/50ML IV EMUL
INTRAVENOUS | Status: DC | PRN
  Administered 2016-05-23: 75 ug/kg/min via INTRAVENOUS

## 2016-05-23 NOTE — Anesthesia Preprocedure Evaluation (Addendum)
Anesthesia Evaluation  Patient identified by MRN, date of birth, ID band Patient awake    Reviewed: Allergy & Precautions, NPO status , Patient's Chart, lab work & pertinent test results  Airway Mallampati: II  TM Distance: >3 FB     Dental  (+) Edentulous Upper, Edentulous Lower, Dental Advisory Given   Pulmonary sleep apnea , former smoker,    breath sounds clear to auscultation       Cardiovascular hypertension, + dysrhythmias  Rhythm:Regular Rate:Normal     Neuro/Psych    GI/Hepatic Neg liver ROS, hiatal hernia, GERD  ,  Endo/Other  diabetes  Renal/GU Renal disease     Musculoskeletal   Abdominal   Peds  Hematology   Anesthesia Other Findings   Reproductive/Obstetrics                            Anesthesia Physical Anesthesia Plan  ASA: III  Anesthesia Plan: MAC   Post-op Pain Management:    Induction: Intravenous  Airway Management Planned: Simple Face Mask  Additional Equipment:   Intra-op Plan:   Post-operative Plan:   Informed Consent: I have reviewed the patients History and Physical, chart, labs and discussed the procedure including the risks, benefits and alternatives for the proposed anesthesia with the patient or authorized representative who has indicated his/her understanding and acceptance.   Dental advisory given  Plan Discussed with: CRNA, Anesthesiologist and Surgeon  Anesthesia Plan Comments:        Anesthesia Quick Evaluation

## 2016-05-23 NOTE — Anesthesia Postprocedure Evaluation (Signed)
Anesthesia Post Note  Patient: Richard [name]  Procedure(s) Performed: Procedure(s) (LRB): COLONOSCOPY WITH PROPOFOL (N/A) GIVENS CAPSULE STUDY (N/A)  Patient location during evaluation: Endoscopy Anesthesia Type: MAC Level of consciousness: awake Pain management: pain level controlled Vital Signs Assessment: post-procedure vital signs reviewed and stable Respiratory status: spontaneous breathing Cardiovascular status: stable Anesthetic complications: no    Last Vitals:  Vitals:   05/23/16 1140 05/23/16 1342  BP: (!) 176/75 (!) 169/77  Pulse: (!) 59 62  Resp: 16 17  Temp:  36.3 C    Last Pain:  Vitals:   05/23/16 1342  TempSrc: Axillary  PainSc:                  EDWARDS,Shania Bjelland

## 2016-05-23 NOTE — Transfer of Care (Signed)
Immediate Anesthesia Transfer of Care Note  Patient: Richard [name]  Procedure(s) Performed: Procedure(s): COLONOSCOPY WITH PROPOFOL (N/A)  Patient Location: Endoscopy Unit  Anesthesia Type:MAC  Level of Consciousness: awake, alert , oriented and patient cooperative  Airway & Oxygen Therapy: Patient Spontanous Breathing and Patient connected to face mask  Post-op Assessment: Report given to RN, Post -op Vital signs reviewed and stable and Patient moving all extremities X 4  Post vital signs: Reviewed and stable  Last Vitals:  Vitals:   05/23/16 0920 05/23/16 1120  BP: (!) 196/77 (!) 155/71  Pulse: 60 60  Resp: 14 19  Temp: 36.5 C     Last Pain:  Vitals:   05/23/16 0920  TempSrc: Oral  PainSc:          Complications: No apparent anesthesia complications

## 2016-05-23 NOTE — Progress Notes (Signed)
OT NOTE  Pt not available at this time for OT due to procedure. Will recheck on pt as schedule allows.  [place], [place]

## 2016-05-23 NOTE — Progress Notes (Signed)
PROGRESS NOTE    Richard [name]  [id number] DOB: 12-19-1930 DOA: 05/21/2016 PCP: Tivis Ringer, MD   Brief Narrative:  Richard [name] is an 82 year old gentleman with a past medical history of CVA, A. fib, had been on Xarelto and aspirin presented to the emergency department 05/21/2016 with complaints of generalized weakness as well as having black stools. Labs showed hemoglobin of 6.9, previously 11.4 on 05/09/2016. Richard [name] was typed and cross and transfuse with 2 units packed red blood cells with repeat hemoglobin 8.1. Upper GI bleed was suspected as GI was consulted, evaluated by Dr. Hilarie Fredrickson. On 05/22/2016 Richard [name] underwent upper endoscopy, procedure performed by Dr. Hilarie Fredrickson, procedure showing normal esophagus, large hiatal hernia, nonbleeding erosive gastropathy. GI recommended avoiding NSAIDs and continue with daily proton pump inhibitor therapy.   Assessment & Plan:   Active Problems:   Benign essential HTN   Diabetes mellitus (HCC)   Atrial fibrillation (HCC)   GERD (gastroesophageal reflux disease)   Generalized weakness   Cerebellar stroke, acute (HCC)   Hyperlipidemia   Cardiac pacemaker in situ   Type 2 diabetes mellitus with circulatory disorder, without long-term current use of insulin (HCC)   GIB (gastrointestinal bleeding)   Diabetes mellitus with complication (HCC)   Upper GI bleed   Melena   GI bleeding   Benign neoplasm of cecum   Benign neoplasm of sigmoid colon  1.  Upper GI bleed -Richard. [name] is an 82 year old gentleman having a history of A. fib and CVA, had been on both aspirin and Xarelto, presented with black tarry stools, highly suggestive of upper GI bleed. -GI was consulted as Richard [name] underwent EGD on 05/22/2016, procedure performed by Dr. Hilarie Fredrickson that revealed nonbleeding erosive gastropathy. -Colonoscopy performed on 05/23/2016 revealing 2 polyps with mild diverticulosis, did not show source of bleed. -Will need to avoid NSAIDs therapy -Continue Protonix 40 mg by  mouth daily -Patient was given video capsule  2.  Acute blood loss anemia -Richard. [name] presented with symptomatic anemia as labs revealed hemoglobin of 6.9 -Likely precipitated by upper GI bleed, Richard [name] was typed and crossed and transfused with 2 units of packed red blood cells overnight with repeat hemoglobin 8.1 -On 05/22/2016 reports feeling much better -Hemoglobin remained stable at 8.4.  3.  History of CVA -Richard [name] has a history of A. fib and had been anticoagulated with Xarelto -Presenting with symptomatic anemia having hemoglobin of 6.9 likely resulting from a GI bleed anticoagulation and antiplatelet therapy discontinued on admission -For now continue holding anticoagulation   4.  Atrial fibrillation -Richard [name] is currently in sinus rhythm with ventricular rates in the 60s -As mentioned above Richard [name] presents with GI bleed and symptomatic anemia for which Xarelto was held on admission -Continue amiodarone 200 mg 3 times weekly and metoprolol 50 mg by mouth twice a day  5.  Hypertension -Richard [name] is on metoprolol titrate 50 mg twice a day along with Norvasc 10 mg by mouth daily -Systolic blood pressures have been ranging from 140's-170's -Will add Imdur 15 mg PO q daily for tighter blood pressure control -On 05/23/2016 blood pressures remain elevated, will increase Imdur to 30 mg by mouth daily   DVT prophylaxis: SCD's Code Status: Full Code Family Communication: I spoke to multiple family members at bedside Disposition Plan:   Consultants:   GI  Procedures:    EGD Impression:       - Normal esophagus.                           -  Large hiatal hernia.                           - Non-bleeding erosive gastropathy.                           - A single, nonbleeding, gastric polyp at incisura.                            Resection not attempted.                           - A single, nonbleeding, gastric polyp in the                            antrum (previously biopsied in Oct 2016 and found                             to be adenoma without high grade dysplasia or                            cancer). Resection not attempted.                           - Normal examined duodenum.  Colonoscopy  Impression:       - The examined portion of the ileum was normal.                           - One 10 mm polyp in the cecum, removed with a hot                            snare. Resected and retrieved. Clip was placed for                            prophylaxis of post polypectomy bleeding given the                            expectation for eventual resumption of                            anticoagulation.                           - One 4 mm polyp in the sigmoid colon, removed with                            a cold snare. Complete resection. Polyp tissue not                            retrieved.                           - Mild diverticulosis in the sigmoid colon and in  the descending colon. There was no evidence of                            diverticular bleeding.                           - Internal hemorrhoids.                           - The examination was otherwise normal.  Subjective: Richard [name] is resting comfortably in bed, no distress, reports having ongoing dark stools  Objective: Vitals:   05/23/16 1120 05/23/16 1130 05/23/16 1140 05/23/16 1342  BP: (!) 155/71 (!) 181/65 (!) 176/75 (!) 169/77  Pulse: 60 62 (!) 59 62  Resp: 19 (!) 21 16 17   Temp:    97.3 F (36.3 C)  TempSrc:    Axillary  SpO2: 100% 98% 96% 96%  Weight:      Height:        Intake/Output Summary (Last 24 hours) at 05/23/16 1519 Last data filed at 05/23/16 1119  Gross per 24 hour  Intake              600 ml  Output             1775 ml  Net            -1175 ml   Filed Weights   05/21/16 1224  Weight: 110.7 kg (244 lb)    Examination:  General exam: Appears calm and comfortable, no acute distress  Respiratory system: Clear to auscultation. Respiratory effort  normal. Cardiovascular system: S1 & S2 heard, RRR. No JVD, murmurs, rubs, gallops or clicks. No pedal edema. Gastrointestinal system: Abdomen is nondistended, soft and nontender. No organomegaly or masses felt. Normal bowel sounds heard. Central nervous system: Alert and oriented. No focal neurological deficits. Extremities: Symmetric 5 x 5 power. Skin: No rashes, lesions or ulcers Psychiatry: Judgement and insight appear normal. Mood & affect appropriate.     Data Reviewed: I have personally reviewed following labs and imaging studies  CBC:  Recent Labs Lab 05/21/16 1226 05/22/16 0540 05/23/16 0530  WBC 6.7 4.5 4.4  NEUTROABS 4.4  --   --   HGB 6.9* 8.1* 8.4*  HCT 23.0* 25.5* 26.9*  MCV 98.3 94.4 94.7  PLT 205 146* [id number]   Basic Metabolic Panel:  Recent Labs Lab 05/21/16 1226 05/22/16 0540 05/23/16 0530  NA 141 140 139  K 4.2 3.9 3.8  CL 107 107 105  CO2 25 25 28   GLUCOSE 119* 122* 152*  BUN 28* 15 8  CREATININE 1.35* 1.08 1.03  CALCIUM 8.9 8.4* 8.8*   GFR: Estimated Creatinine Clearance (by C-G formula based on SCr of 1.03 mg/dL) Male: 60.3 mL/min Male: 73 mL/min Liver Function Tests:  Recent Labs Lab 05/21/16 1226 05/22/16 0540  AST 21 20  ALT 15* 15*  ALKPHOS 65 59  BILITOT 0.5 0.9  PROT 6.1* 6.0*  ALBUMIN 3.2* 3.0*   No results for input(s): LIPASE, AMYLASE in the last 168 hours. No results for input(s): AMMONIA in the last 168 hours. Coagulation Profile:  Recent Labs Lab 05/21/16 1226  INR 1.32   Cardiac Enzymes: No results for input(s): CKTOTAL, CKMB, CKMBINDEX, TROPONINI in the last 168 hours. BNP (last 3 results) No results for input(s): PROBNP in the last 8760 hours. HbA1C: No results for input(s): HGBA1C  in the last 72 hours. CBG:  Recent Labs Lab 05/22/16 1201 05/22/16 1725 05/22/16 2126 05/23/16 0758 05/23/16 1233  GLUCAP 123* 115* 146* 227* 99   Lipid Profile: No results for input(s): CHOL, HDL, LDLCALC, TRIG,  CHOLHDL, LDLDIRECT in the last 72 hours. Thyroid Function Tests: No results for input(s): TSH, T4TOTAL, FREET4, T3FREE, THYROIDAB in the last 72 hours. Anemia Panel: No results for input(s): VITAMINB12, FOLATE, FERRITIN, TIBC, IRON, RETICCTPCT in the last 72 hours. Sepsis Labs: No results for input(s): PROCALCITON, LATICACIDVEN in the last 168 hours.  No results found for this or any previous visit (from the past 240 hour(s)).       Radiology Studies: No results found.      Scheduled Meds: . sodium chloride   Intravenous Once  . acetaminophen  500 mg Oral BID  . amiodarone  200 mg Oral Once per day on Mon Wed Fri  . amLODipine  10 mg Oral Daily  . atorvastatin  40 mg Oral q1800  . fluticasone  1 spray Each Nare Daily  . insulin aspart  0-9 Units Subcutaneous TID WC  . isosorbide mononitrate  15 mg Oral Daily  . metoprolol  50 mg Oral BID  . pantoprazole  40 mg Oral Q0600  . sertraline  100 mg Oral Daily  . sodium chloride flush  3 mL Intravenous Q12H   Continuous Infusions: . sodium chloride 75 mL/hr at 05/22/16 1026  . lactated ringers 10 mL/hr at 05/23/16 0922     LOS: 1 day    Time spent: 37 min    Kelvin Cellar, MD [facility] Pager [phone]  If 7PM-7AM, please contact night-coverage [email or web address] Password [facility] 05/23/2016, 3:19 PM

## 2016-05-23 NOTE — Op Note (Signed)
[facility] Patient Name: Richard [name] Procedure Date : 05/23/2016 MRN: [id number] Attending MD: Jerene Bears , MD Date of Birth: [birth year] CSN: [id number] Age: 82 Admit Type: Inpatient Procedure:                Colonoscopy Indications:              Melena, Acute post hemorrhagic anemia, Iron                            deficiency anemia Providers:                Lajuan Lines. Hilarie Fredrickson, MD, Carolynn Comment, RN, Corliss Parish, Technician Referring MD:             [facility] Medicines:                Monitored Anesthesia Care Complications:            No immediate complications. Estimated Blood Loss:     Estimated blood loss: none. Procedure:                Pre-Anesthesia Assessment:                           - Prior to the procedure, a History and Physical                            was performed, and patient medications and                            allergies were reviewed. The patient's tolerance of                            previous anesthesia was also reviewed. The risks                            and benefits of the procedure and the sedation                            options and risks were discussed with the patient.                            All questions were answered, and informed consent                            was obtained. Prior Anticoagulants: The patient has                            taken Xarelto (rivaroxaban), last dose was 3 days                            prior to procedure. ASA Grade Assessment: III - A  patient with severe systemic disease. After                            reviewing the risks and benefits, the patient was                            deemed in satisfactory condition to undergo the                            procedure.                           After obtaining informed consent, the colonoscope                            was passed under direct vision. Throughout the                           procedure, the patient's blood pressure, pulse, and                            oxygen saturations were monitored continuously. The                            [id number] scope was introduced through                            the anus and advanced to the the terminal ileum.                            The colonoscopy was performed without difficulty.                            The patient tolerated the procedure well. The                            quality of the bowel preparation was good. The                            terminal ileum, ileocecal valve, appendiceal                            orifice, and rectum were photographed. Scope In: 10:40:14 AM Scope Out: 11:08:06 AM Scope Withdrawal Time: 0 hours 14 minutes 17 seconds  Total Procedure Duration: 0 hours 27 minutes 52 seconds  Findings:      The digital rectal exam was normal.      The terminal ileum appeared normal.      A 10 mm polyp was found in the cecum. The polyp was sessile. The polyp       was removed with a hot snare. Resection and retrieval were complete. To       prevent bleeding after the polypectomy, one hemostatic clip was       successfully placed.      A 4 mm polyp was found in the sigmoid colon. The polyp was sessile.  The       polyp was removed with a cold snare. Resection was complete, but the       polyp tissue was not retrieved.      Multiple medium-mouthed diverticula were found in the sigmoid colon and       descending colon. There was no evidence of diverticular bleeding.      Internal hemorrhoids were found during retroflexion. The hemorrhoids       were medium-sized.      The exam was otherwise without abnormality. Impression:               - The examined portion of the ileum was normal.                           - One 10 mm polyp in the cecum, removed with a hot                            snare. Resected and retrieved. Clip was placed for                             prophylaxis of post polypectomy bleeding given the                            expectation for eventual resumption of                            anticoagulation.                           - One 4 mm polyp in the sigmoid colon, removed with                            a cold snare. Complete resection. Polyp tissue not                            retrieved.                           - Mild diverticulosis in the sigmoid colon and in                            the descending colon. There was no evidence of                            diverticular bleeding.                           - Internal hemorrhoids.                           - The examination was otherwise normal. Moderate Sedation:      N/A Recommendation:           - Return patient to hospital ward for ongoing care.                           - Continue  other present medications.                           - Hold Xarelto (rivaroxaban) at prior dose for at                            least 7 days given polypectomy today.                           - To visualize the small bowel, perform video                            capsule endoscopy today. This study will likely be                            read early next week (the patient does not need to                            remain hospitalized for this report)                           - Monitor Hgb close and replace iron as needed.                           - Await pathology results.                           - No recommendation at this time regarding repeat                            colonoscopy due to age.                           - Resume previous diet based on video capsule                            protocol. Procedure Code(s):        --- Professional ---                           [phone], Colonoscopy, flexible; with removal of                            tumor(s), polyp(s), or other lesion(s) by snare                            technique Diagnosis Code(s):        --- Professional ---                            K64.8, Other hemorrhoids                           D12.0, Benign neoplasm of cecum  D12.5, Benign neoplasm of sigmoid colon                           K92.1, Melena (includes Hematochezia)                           D62, Acute posthemorrhagic anemia                           D50.9, Iron deficiency anemia, unspecified                           K57.30, Diverticulosis of large intestine without                            perforation or abscess without bleeding CPT copyright 2016 [employer]. All rights reserved. The codes documented in this report are preliminary and upon coder review may  be revised to meet current compliance requirements. Jerene Bears, MD 05/23/2016 11:19:11 AM This report has been signed electronically. Number of Addenda: 0

## 2016-05-23 NOTE — Anesthesia Procedure Notes (Signed)
Procedure Name: MAC Date/Time: 05/23/2016 10:34 AM Performed by: Carney Living Pre-anesthesia Checklist: Patient identified, Emergency Drugs available, Suction available, Patient being monitored and Timeout performed Patient Re-evaluated:Patient Re-evaluated prior to inductionOxygen Delivery Method: Simple face mask

## 2016-05-23 NOTE — Progress Notes (Signed)
Givens capsule ingested at [id number]; instructions given and understanding was stated by both patient and family member

## 2016-05-24 DIAGNOSIS — I482 Chronic atrial fibrillation: Secondary | ICD-10-CM

## 2016-05-24 LAB — CBC
HEMATOCRIT: 28.6 % — AB (ref 39.0–52.0)
HEMOGLOBIN: 8.7 g/dL — AB (ref 13.0–17.0)
MCH: 29.5 pg (ref 26.0–34.0)
MCHC: 30.4 g/dL (ref 30.0–36.0)
MCV: 96.9 fL (ref 78.0–100.0)
Platelets: 165 10*3/uL (ref 150–400)
RBC: 2.95 MIL/uL — ABNORMAL LOW (ref 4.22–5.81)
RDW: 17.2 % — AB (ref 11.5–15.5)
WBC: 4.5 10*3/uL (ref 4.0–10.5)

## 2016-05-24 LAB — BASIC METABOLIC PANEL
ANION GAP: 5 (ref 5–15)
BUN: 7 mg/dL (ref 6–20)
CALCIUM: 8.7 mg/dL — AB (ref 8.9–10.3)
CO2: 29 mmol/L (ref 22–32)
CREATININE: 1.1 mg/dL (ref 0.61–1.24)
Chloride: 105 mmol/L (ref 101–111)
GFR calc non Af Amer: >60 mL/min (ref >60)
Glucose, Bld: 142 mg/dL — ABNORMAL HIGH (ref 65–99)
Potassium: 3.7 mmol/L (ref 3.5–5.1)
SODIUM: 139 mmol/L (ref 135–145)

## 2016-05-24 LAB — GLUCOSE, CAPILLARY: GLUCOSE-CAPILLARY: 140 mg/dL — AB (ref 65–99)

## 2016-05-24 MED ORDER — ISOSORBIDE MONONITRATE ER 30 MG PO TB24: 30.0000 mg | ORAL_TABLET | Freq: Every day | ORAL | 1 refills | Status: DC

## 2016-05-24 MED ORDER — PANTOPRAZOLE SODIUM 40 MG PO TBEC: 40.0000 mg | DELAYED_RELEASE_TABLET | Freq: Every day | ORAL | 1 refills | Status: AC

## 2016-05-24 NOTE — Discharge Summary (Signed)
Physician Discharge Summary  EYOB WORM [id number] DOB: 06/14/1931 DOA: 05/21/2016  PCP: Tivis Ringer, MD  Admit date: 05/21/2016 Discharge date: 05/24/2016  Time spent: 35 minutes  Recommendations for Outpatient Follow-up:  1. Please follow up on CBC on hospital follow up visit, he was admitted for GI bleed, Xarelto and ASA stopped. He was transfused with PRBC's during this hospitalization. On 05/24/2016 had Hg of 8.7. He was instructed to not take ASA or Xarelto for 1 week.  2. Follow up on Capsule Endoscopy 3. Please follow up on blood pressures, he was hypertensive during this hospitalization for which Imdur was added to his regimen.    Discharge Diagnoses:  Active Problems:   Benign essential HTN   Diabetes mellitus (HCC)   Atrial fibrillation (HCC)   GERD (gastroesophageal reflux disease)   Generalized weakness   Cerebellar stroke, acute (HCC)   Hyperlipidemia   Cardiac pacemaker in situ   Type 2 diabetes mellitus with circulatory disorder, without long-term current use of insulin (HCC)   GIB (gastrointestinal bleeding)   Diabetes mellitus with complication (HCC)   Upper GI bleed   Melena   GI bleeding   Benign neoplasm of cecum   Benign neoplasm of sigmoid colon   Discharge Condition: Stable  Diet recommendation: Heart Healthy  Filed Weights   05/21/16 1224  Weight: 110.7 kg (244 lb)    History of present illness:  Richard [name] is a 82 y.o. 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 He was seen by his PCP this morning, with Hb was 6.9 and was told to come  to the ED for further workup . He reports black stools since last night, total of 3, without diarrhea.Of note, on discharge, ASA daily  was added to his Xarelto (night of 9/26) Denies abdominal pain, nausea or vomiting. Denies any dizziness. Denies any shortness of breath or cough. Denies any dysuria. Denies other bleeding issues such as hematuria, hematemesis or hemoptysis. He is recoivering well from stroke, with his motor skills at baseline. No confusion, headaches, vision changes or seizures noted  Last  Upper endoscopy   Hospital Course:  Richard [name] is an 82 year old gentleman with a past medical history of CVA, A. fib, had been on Xarelto and aspirin presented to the emergency department 05/21/2016 with complaints of generalized weakness as well as having black stools. Labs showed hemoglobin of 6.9, previously 11.4 on 05/09/2016. He was typed and cross and transfuse with 2 units packed red blood cells with repeat hemoglobin 8.1. Upper GI bleed was suspected as GI was consulted, evaluated by Richard. Hilarie [name]. On 05/22/2016 he underwent upper endoscopy, procedure performed by Richard. Hilarie [name], procedure showing normal esophagus, large hiatal hernia, nonbleeding erosive gastropathy. GI recommended avoiding NSAIDs and continue with daily proton pump inhibitor therapy.  1.  Upper GI bleed -Richard. [name] is an 82 year old gentleman having a history of A. fib and CVA, had been on both aspirin and Xarelto, presented with black tarry stools, highly suggestive of upper GI bleed. -GI was consulted as he underwent EGD on 05/22/2016, procedure performed by Richard. Hilarie [name] that revealed nonbleeding erosive gastropathy. -Colonoscopy performed on 05/23/2016 revealing 2 polyps with mild diverticulosis, did  not show source of bleed. -Will need to avoid NSAIDs therapy -Patient was given video capsule -CBC on 05/24/2016 showing Hg of 8.7  2.  Acute blood loss anemia -Richard. [name] presented with symptomatic anemia as labs revealed  hemoglobin of 6.9 -Likely precipitated by upper GI bleed, he was typed and crossed and transfused with 2 units of packed red blood cells overnight with repeat hemoglobin 8.1 -On 05/22/2016 reports feeling much better -Hemoglobin remained stable at 8.7  3.  History of CVA -He has a history of A. fib and had been anticoagulated with Xarelto -Presenting with symptomatic anemia having hemoglobin of 6.9 likely resulting from a GI bleed anticoagulation and antiplatelet therapy discontinued on admission -Plan to hold anticoagulation for 1 week as recommended by GI  4.  Atrial fibrillation -He is currently in sinus rhythm with ventricular rates in the 60s -As mentioned above he presents with GI bleed and symptomatic anemia for which Xarelto was held on admission -Continue amiodarone 200 mg 3 times weekly and metoprolol 50 mg by mouth twice a day  5.  Hypertension -He is on metoprolol titrate 50 mg twice a day along with Norvasc 10 mg by mouth daily -Systolic blood pressures have been ranging from 140's-170's -Will add Imdur 15 mg PO q daily for tighter blood pressure control -On 05/23/2016 blood pressures remain elevated, will increase Imdur to 30 mg by mouth daily   Procedures:  EGD Impression: - Normal esophagus. - Large hiatal hernia. - Non-bleeding erosive gastropathy. - A single, nonbleeding, gastric polyp at incisura.  Resection not attempted. - A single, nonbleeding, gastric polyp in the  antrum (previously biopsied in Oct 2016 and found  to be adenoma without high grade dysplasia or  cancer). Resection not attempted. - Normal examined duodenum.  Colonoscopy  Impression: - The examined portion of the ileum was  normal. - One 10 mm polyp in the cecum, removed with a hot  snare. Resected and retrieved. Clip was placed for  prophylaxis of post polypectomy bleeding given the  expectation for eventual resumption of  anticoagulation. - One 4 mm polyp in the sigmoid colon, removed with  a cold snare. Complete resection. Polyp tissue not  retrieved. - Mild diverticulosis in the sigmoid colon and in  the descending colon. There was no evidence of  diverticular bleeding. - Internal hemorrhoids. - The examination was otherwise normal.  Consultations:  GI  Discharge Exam: Vitals:   05/23/16 2302 05/24/16 0510  BP:  (!) 152/72  Pulse: 70 67  Resp: 18 18  Temp:  98.1 F (36.7 C)    General exam: Appears calm and comfortable, no acute distress  Respiratory system: Clear to auscultation. Respiratory effort normal. Cardiovascular system: S1 & S2 heard, RRR. No JVD, murmurs, rubs, gallops or clicks. No pedal edema. Gastrointestinal system: Abdomen is nondistended, soft and nontender. No organomegaly or masses felt. Normal bowel sounds heard. Central nervous system: Alert and oriented. No focal neurological deficits. Extremities: Symmetric 5 x 5 power. Skin: No rashes, lesions or ulcers Psychiatry: Judgement and insight appear normal. Mood & affect appropriate.   Discharge Instructions   Discharge Instructions    Call MD for:    Complete by:  As directed    Call MD for:  difficulty breathing, headache or visual disturbances    Complete by:  As directed    Call MD for:  extreme fatigue    Complete by:  As directed    Call MD for:  hives    Complete  by:  As  directed    Call MD for:  persistant dizziness or light-headedness    Complete by:  As directed    Call MD for:  persistant nausea and vomiting    Complete by:  As directed    Call MD for:  redness, tenderness, or signs of infection (pain, swelling, redness, odor or green/yellow discharge around incision site)    Complete by:  As directed    Call MD for:  severe uncontrolled pain    Complete by:  As directed    Call MD for:  temperature >100.4    Complete by:  As directed    Diet - low sodium heart healthy    Complete by:  As directed    Increase activity slowly    Complete by:  As directed      Current Discharge Medication List    START taking these medications   Details  isosorbide mononitrate (IMDUR) 30 MG 24 hr tablet Take 1 tablet (30 mg total) by mouth daily. Qty: 30 tablet, Refills: 1    pantoprazole (PROTONIX) 40 MG tablet Take 1 tablet (40 mg total) by mouth daily at 6 (six) AM. Qty: 30 tablet, Refills: 1      CONTINUE these medications which have NOT CHANGED   Details  acetaminophen (TYLENOL) 500 MG tablet Take 500 mg by mouth 2 (two) times daily.     amiodarone (PACERONE) 200 MG tablet Take 200 mg by mouth 3 (three) times a week. On Monday, Wednesday & Friday    amLODipine (NORVASC) 10 MG tablet Take 1 tablet (10 mg total) by mouth daily. Qty: 30 tablet, Refills: 0    atorvastatin (LIPITOR) 40 MG tablet Take 1 tablet (40 mg total) by mouth daily at 6 PM. Qty: 30 tablet, Refills: 0    cyanocobalamin (,VITAMIN B-12,) 1000 MCG/ML injection Inject 1,000 mcg into the muscle every 30 (thirty) days.    fluticasone (FLONASE) 50 MCG/ACT nasal spray Place 1 spray into the nose daily as needed for allergies or rhinitis.     metoprolol (LOPRESSOR) 50 MG tablet TAKE 1 TABLET BY MOUTH TWICE A DAY Qty: 60 tablet, Refills: 11    pioglitazone (ACTOS) 30 MG tablet Take 30 mg by mouth daily.      senna-docusate (SENOKOT-S) 8.6-50 MG tablet Take 2 tablets by mouth 2 (two)  times daily. For constipation Qty: 100 tablet, Refills: 0    sertraline (ZOLOFT) 100 MG tablet Take 1 tablet by mouth daily.      STOP taking these medications     aspirin EC 81 MG EC tablet      omeprazole (PRILOSEC) 20 MG capsule      OXYGEN      XARELTO 20 MG TABS tablet        Allergies  Allergen Reactions  . Codeine Other (See Comments)    Hallucinations   . Citalopram Swelling and Rash      The results of significant diagnostics from this hospitalization (including imaging, microbiology, ancillary and laboratory) are listed below for reference.    Significant Diagnostic Studies: Ct Angio Head W/cm &/or Wo Cm  Result Date: 05/05/2016 CLINICAL DATA:  82 y/o  M; acute dizziness and generalized weakness. EXAM: CT ANGIOGRAPHY HEAD AND NECK TECHNIQUE: Multidetector CT imaging of the head and neck was performed using the standard protocol during bolus administration of intravenous contrast. Multiplanar CT image reconstructions and MIPs were obtained to evaluate the vascular anatomy. Carotid stenosis measurements (when applicable) are obtained  utilizing NASCET criteria, using the distal internal carotid diameter as the denominator. CONTRAST:  75 cc Isovue 370 COMPARISON:  05/05/2016 CT head.  05/18/2013 MRI brain. FINDINGS: CTA NECK Aortic arch: Mild calcific atherosclerosis of the arch. Four vessel arch. Right carotid system: No occlusion, aneurysm, dissection, or significant stenosis is identified. Minimal mixed plaque at the bifurcation. Retropharyngeal course of common carotid artery. Left carotid system: No occlusion, aneurysm, dissection, or significant stenosis is identified. Minimal mixed plaque at the bifurcation. Retropharyngeal course of common carotid artery. Vertebral arteries:No occlusion, aneurysm, dissection, or significant stenosis is identified. Skeleton: Multilevel degenerative changes of the cervical spine most pronounced at the C5 through C7 levels. No high-grade  bony canal stenosis or foraminal narrowing. Other neck: Small thyroid nodules the largest in the right lower pole measuring 10 patent aerodigestive tract. Inflammatory calcifications of palatini tonsils. Fatty atrophy of the right parotid gland and calcifications probably represent sequelae of prior parotiditis. No lymphadenopathy or discrete cervical mass is identified. Left maxillary sinus aerosolized secretions, chronic inflammatory changes, and atelectasis. Otherwise visualized paranasal sinuses and mastoids are clear. Bilateral intra-ocular lens replacement. Partially visualized pacemaker. Upper chest: Peripheral reticular changes in the lung probably represents mild fibrosis. CTA HEAD Anterior circulation: Moderate calcific atherosclerosis of cavernous and paraclinoid internal carotid artery is bilateral middle cerebral arteries, anterior cerebral arteries, and internal carotid arteries are patent. Symmetric distal circulation. No occlusion, aneurysm, dissection, or significant stenosis is identified. Posterior circulation: Minimal calcification of the bilateral V4 segments. Bilateral vertebral arteries, the basilar artery, and posterior cerebral arteries are patent. Symmetric distal circulation. No occlusion, aneurysm, dissection, or significant stenosis is identified. Venous sinuses: No thrombus identified. Anatomic variants: Patent anterior communicating artery. Fetal left PCA. No right posterior communicating artery identified, hypoplastic or absent. Delayed phase: Small hyperdense focus within the right paramedian pons, dense on noncontrast CT, possibly mineralization related to old lacunar infarct. No definite abnormal enhancement. IMPRESSION: No occlusion, aneurysm, dissection, or significant stenosis of the circle of Willis or carotid and vertebral arteries of the neck is identified. Mild mixed plaque of the carotid bifurcations and intracranial calcific atherosclerosis. Electronically Signed   By:  Kristine Garbe M.D.   On: 05/05/2016 20:36   Dg Chest 2 View  Result Date: 05/05/2016 CLINICAL DATA:  Weakness, dizziness and left-sided chest pain. EXAM: CHEST  2 VIEW COMPARISON:  06/04/2014 FINDINGS: The pacer wires are stable. The heart is enlarged but unchanged. There is marked tortuosity, ectasia and calcification of the thoracic aorta which appears stable. Mild chronic bronchitic changes with suspected superimposed interstitial pulmonary edema with peribronchial thickening and increased interseptal lines peripherally (Kerley B-lines) small bilateral pleural effusions. Stable hiatal hernia. IMPRESSION: 1. Cardiac enlargement, interstitial edema and small effusions suggesting CHF. 2. Stable large hiatal hernia. 3. Stable right atrial and right ventricular pacer wires. Electronically Signed   By: Marijo Sanes M.D.   On: 05/05/2016 11:12   Ct Head Wo Contrast  Result Date: 05/08/2016 CLINICAL DATA:  Headache.  Prior stroke EXAM: CT HEAD WITHOUT CONTRAST TECHNIQUE: Contiguous axial images were obtained from the base of the skull through the vertex without intravenous contrast. COMPARISON:  Head CT 05/05/2016 FINDINGS: Brain: No mass lesion, intraparenchymal hemorrhage or extra-axial collection. No evidence of acute cortical infarct. There is confluent periventricular hypoattenuation compatible with chronic microvascular ischemia. Vascular: There is calcific atherosclerosis of the intracranial internal carotid and vertebral arteries. Skull: Normal visualized skull base, calvarium and extracranial soft tissues. Sinuses/Orbits: There is an atelectatic left maxillary sinus with internal frothy  secretions. The other paranasal sinuses are normal. No mastoid or middle ear effusion. Normal orbits. IMPRESSION: 1. No acute intracranial abnormality. 2. Chronic microvascular ischemia. Electronically Signed   By: Ulyses Jarred M.D.   On: 05/08/2016 05:32   Ct Head Wo Contrast  Result Date:  05/05/2016 CLINICAL DATA:  82 year old male with syncope, severe generalized weakness nausea and dizziness. Initial encounter. EXAM: CT HEAD WITHOUT CONTRAST TECHNIQUE: Contiguous axial images were obtained from the base of the skull through the vertex without intravenous contrast. COMPARISON:  Head CT without contrast 05/31/2014. Brain MRI 05/18/2013. FINDINGS: Brain: Chronic confluent cerebral white matter hypodensity bilaterally. New heterogeneity in the region of the left thalamus since 2015. No acute or chronic cortically based infarct identified. No midline shift, mass effect, or evidence of intracranial mass lesion. No ventriculomegaly. No acute intracranial hemorrhage identified. Vascular: Calcified atherosclerosis at the skull base. A degree of chronic intracranial artery dolichoectasia is again noted. No suspicious intracranial vascular hyperdensity. Skull: No acute osseous abnormality identified. Sinuses/Orbits: Visualized paranasal sinuses and mastoids are stable and well pneumatized. Other: No acute orbit or scalp soft tissue findings. IMPRESSION: 1. Evidence of progressed chronic small vessel disease in the left thalamus since 2015. Underlying chronic white matter disease. 2.  No acute intracranial abnormality identified. Electronically Signed   By: Genevie Ann M.D.   On: 05/05/2016 12:46   Ct Angio Neck W Or Wo Contrast  Result Date: 05/05/2016 CLINICAL DATA:  82 y/o  M; acute dizziness and generalized weakness. EXAM: CT ANGIOGRAPHY HEAD AND NECK TECHNIQUE: Multidetector CT imaging of the head and neck was performed using the standard protocol during bolus administration of intravenous contrast. Multiplanar CT image reconstructions and MIPs were obtained to evaluate the vascular anatomy. Carotid stenosis measurements (when applicable) are obtained utilizing NASCET criteria, using the distal internal carotid diameter as the denominator. CONTRAST:  75 cc Isovue 370 COMPARISON:  05/05/2016 CT head.   05/18/2013 MRI brain. FINDINGS: CTA NECK Aortic arch: Mild calcific atherosclerosis of the arch. Four vessel arch. Right carotid system: No occlusion, aneurysm, dissection, or significant stenosis is identified. Minimal mixed plaque at the bifurcation. Retropharyngeal course of common carotid artery. Left carotid system: No occlusion, aneurysm, dissection, or significant stenosis is identified. Minimal mixed plaque at the bifurcation. Retropharyngeal course of common carotid artery. Vertebral arteries:No occlusion, aneurysm, dissection, or significant stenosis is identified. Skeleton: Multilevel degenerative changes of the cervical spine most pronounced at the C5 through C7 levels. No high-grade bony canal stenosis or foraminal narrowing. Other neck: Small thyroid nodules the largest in the right lower pole measuring 10 patent aerodigestive tract. Inflammatory calcifications of palatini tonsils. Fatty atrophy of the right parotid gland and calcifications probably represent sequelae of prior parotiditis. No lymphadenopathy or discrete cervical mass is identified. Left maxillary sinus aerosolized secretions, chronic inflammatory changes, and atelectasis. Otherwise visualized paranasal sinuses and mastoids are clear. Bilateral intra-ocular lens replacement. Partially visualized pacemaker. Upper chest: Peripheral reticular changes in the lung probably represents mild fibrosis. CTA HEAD Anterior circulation: Moderate calcific atherosclerosis of cavernous and paraclinoid internal carotid artery is bilateral middle cerebral arteries, anterior cerebral arteries, and internal carotid arteries are patent. Symmetric distal circulation. No occlusion, aneurysm, dissection, or significant stenosis is identified. Posterior circulation: Minimal calcification of the bilateral V4 segments. Bilateral vertebral arteries, the basilar artery, and posterior cerebral arteries are patent. Symmetric distal circulation. No occlusion,  aneurysm, dissection, or significant stenosis is identified. Venous sinuses: No thrombus identified. Anatomic variants: Patent anterior communicating artery. Fetal left PCA. No  right posterior communicating artery identified, hypoplastic or absent. Delayed phase: Small hyperdense focus within the right paramedian pons, dense on noncontrast CT, possibly mineralization related to old lacunar infarct. No definite abnormal enhancement. IMPRESSION: No occlusion, aneurysm, dissection, or significant stenosis of the circle of Willis or carotid and vertebral arteries of the neck is identified. Mild mixed plaque of the carotid bifurcations and intracranial calcific atherosclerosis. Electronically Signed   By: Kristine Garbe M.D.   On: 05/05/2016 20:36    Microbiology: No results found for this or any previous visit (from the past 240 hour(s)).   Labs: Basic Metabolic Panel:  Recent Labs Lab 05/21/16 1226 05/22/16 0540 05/23/16 0530 05/24/16 0624  NA 141 140 139 139  K 4.2 3.9 3.8 3.7  CL 107 107 105 105  CO2 25 25 28 29   GLUCOSE 119* 122* 152* 142*  BUN 28* 15 8 7   CREATININE 1.35* 1.08 1.03 1.10  CALCIUM 8.9 8.4* 8.8* 8.7*   Liver Function Tests:  Recent Labs Lab 05/21/16 1226 05/22/16 0540  AST 21 20  ALT 15* 15*  ALKPHOS 65 59  BILITOT 0.5 0.9  PROT 6.1* 6.0*  ALBUMIN 3.2* 3.0*   No results for input(s): LIPASE, AMYLASE in the last 168 hours. No results for input(s): AMMONIA in the last 168 hours. CBC:  Recent Labs Lab 05/21/16 1226 05/22/16 0540 05/23/16 0530 05/24/16 0624  WBC 6.7 4.5 4.4 4.5  NEUTROABS 4.4  --   --   --   HGB 6.9* 8.1* 8.4* 8.7*  HCT 23.0* 25.5* 26.9* 28.6*  MCV 98.3 94.4 94.7 96.9  PLT 205 146* 155 165   Cardiac Enzymes: No results for input(s): CKTOTAL, CKMB, CKMBINDEX, TROPONINI in the last 168 hours. BNP: BNP (last 3 results) No results for input(s): BNP in the last 8760 hours.  ProBNP (last 3 results) No results for input(s):  PROBNP in the last 8760 hours.  CBG:  Recent Labs Lab 05/23/16 0758 05/23/16 1233 05/23/16 1626 05/23/16 2110 05/24/16 0800  GLUCAP 227* 99 166* 128* 140*       Signed:  Kelvin Cellar MD.  [facility] 05/24/2016, 10:42 AM

## 2016-05-24 NOTE — Progress Notes (Signed)
Video capsule study completed and images being downloaded Anticipate read on Monday Patient being discharged home. Recommend he remain on Xarelto 7 days given recent bleeding and polypectomy performed yesterday at time of colonoscopy. We will contact him with video capsule report and he will follow-up with Dr. Carlean Purl Hgb stable today to slightly better by chart check

## 2016-05-25 ENCOUNTER — Encounter (HOSPITAL_COMMUNITY): Payer: Self-pay | Admitting: Internal Medicine

## 2016-05-25 DIAGNOSIS — I639 Cerebral infarction, unspecified: Secondary | ICD-10-CM

## 2016-05-25 HISTORY — DX: Cerebral infarction, unspecified: I63.9

## 2016-05-25 LAB — TYPE AND SCREEN
ABO/RH(D): O POS
ANTIBODY SCREEN: NEGATIVE
UNIT DIVISION: 0
Unit division: 0
Unit division: 0

## 2016-05-26 ENCOUNTER — Telehealth: Payer: Self-pay

## 2016-05-26 ENCOUNTER — Telehealth: Payer: Self-pay | Admitting: Internal Medicine

## 2016-05-26 ENCOUNTER — Telehealth: Payer: Self-pay | Admitting: Cardiovascular Disease

## 2016-05-26 ENCOUNTER — Other Ambulatory Visit: Payer: Self-pay

## 2016-05-26 ENCOUNTER — Telehealth: Payer: Self-pay | Admitting: Physical Medicine & Rehabilitation

## 2016-05-26 DIAGNOSIS — D62 Acute posthemorrhagic anemia: Secondary | ICD-10-CM

## 2016-05-26 NOTE — Telephone Encounter (Signed)
Reviewed chart and per DC summary it was recommended that the pt not take Xarelto and ASA for 1 week.   I spoke with the pt's wife and she just received a phone call from Dr Vena Rua office and the pt has a bleed in small bowels.  The pt has to get a blood count tomorrow and then will need a procedure arranged with Dr Carlean Purl.  I advised that the pt remain off of Xarelto and ASA until cleared by GI. Wife agreed with plan.

## 2016-05-26 NOTE — Telephone Encounter (Signed)
Spoke with pts wife and she is aware and will await call regarding procedure with Dr. Carlean Purl. Pts wife states they will come tomorrow for CBC as he has an appt near our office that day. Wife knows to hold xarelto.

## 2016-05-26 NOTE — Telephone Encounter (Signed)
Orders given.  

## 2016-05-26 NOTE — Telephone Encounter (Signed)
-----   Message from Jerene Bears, MD sent at 05/26/2016  4:41 PM EDT ----- Regarding: VCE Please contact patient with VCE results.  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 Carlean Purl will consider push enteroscopy to try and reach and ablate these lesions 2. Hold Xarelto given recent polypectomy and active bleeding from small bowel 3. Will need Hgb checked, later this week reasonable  Printer upstairs not working. Amy can help with this. JMP

## 2016-05-26 NOTE — Telephone Encounter (Signed)
See additional phone note. 

## 2016-05-26 NOTE — Telephone Encounter (Signed)
New message    Pt wife verbalized that Dr. Purcell Mouton took pt off his medications and that she wants to know when he can resume his normal prescriptions.  Xarelto and Asprine

## 2016-05-26 NOTE — Telephone Encounter (Signed)
Richard [name] with [facility] needs to get verbal orders to re certify the patient for OT and PT.  Please call her at [phone] ext. [phone].

## 2016-05-27 ENCOUNTER — Other Ambulatory Visit (INDEPENDENT_AMBULATORY_CARE_PROVIDER_SITE_OTHER): Payer: Medicare Other

## 2016-05-27 ENCOUNTER — Encounter: Payer: Medicare Other | Attending: Physical Medicine & Rehabilitation

## 2016-05-27 ENCOUNTER — Encounter: Payer: Self-pay | Admitting: Physical Medicine & Rehabilitation

## 2016-05-27 ENCOUNTER — Encounter: Payer: Self-pay | Admitting: Internal Medicine

## 2016-05-27 ENCOUNTER — Ambulatory Visit (HOSPITAL_BASED_OUTPATIENT_CLINIC_OR_DEPARTMENT_OTHER): Payer: Medicare Other | Admitting: Physical Medicine & Rehabilitation

## 2016-05-27 VITALS — BP 114/63 | HR 72 | Ht 75.0 in | Wt 240.0 lb

## 2016-05-27 DIAGNOSIS — Z8349 Family history of other endocrine, nutritional and metabolic diseases: Secondary | ICD-10-CM | POA: Diagnosis not present

## 2016-05-27 DIAGNOSIS — I10 Essential (primary) hypertension: Secondary | ICD-10-CM | POA: Diagnosis not present

## 2016-05-27 DIAGNOSIS — R278 Other lack of coordination: Secondary | ICD-10-CM | POA: Diagnosis present

## 2016-05-27 DIAGNOSIS — D62 Acute posthemorrhagic anemia: Secondary | ICD-10-CM | POA: Diagnosis not present

## 2016-05-27 DIAGNOSIS — I69393 Ataxia following cerebral infarction: Secondary | ICD-10-CM | POA: Diagnosis not present

## 2016-05-27 DIAGNOSIS — R4189 Other symptoms and signs involving cognitive functions and awareness: Secondary | ICD-10-CM

## 2016-05-27 DIAGNOSIS — K219 Gastro-esophageal reflux disease without esophagitis: Secondary | ICD-10-CM | POA: Diagnosis not present

## 2016-05-27 DIAGNOSIS — K922 Gastrointestinal hemorrhage, unspecified: Secondary | ICD-10-CM | POA: Insufficient documentation

## 2016-05-27 DIAGNOSIS — E119 Type 2 diabetes mellitus without complications: Secondary | ICD-10-CM | POA: Insufficient documentation

## 2016-05-27 DIAGNOSIS — F419 Anxiety disorder, unspecified: Secondary | ICD-10-CM | POA: Diagnosis not present

## 2016-05-27 DIAGNOSIS — R269 Unspecified abnormalities of gait and mobility: Secondary | ICD-10-CM

## 2016-05-27 DIAGNOSIS — I4891 Unspecified atrial fibrillation: Secondary | ICD-10-CM | POA: Insufficient documentation

## 2016-05-27 DIAGNOSIS — I69311 Memory deficit following cerebral infarction: Secondary | ICD-10-CM | POA: Diagnosis not present

## 2016-05-27 DIAGNOSIS — E785 Hyperlipidemia, unspecified: Secondary | ICD-10-CM | POA: Insufficient documentation

## 2016-05-27 DIAGNOSIS — I69398 Other sequelae of cerebral infarction: Secondary | ICD-10-CM | POA: Insufficient documentation

## 2016-05-27 DIAGNOSIS — I639 Cerebral infarction, unspecified: Secondary | ICD-10-CM | POA: Diagnosis not present

## 2016-05-27 DIAGNOSIS — Z79899 Other long term (current) drug therapy: Secondary | ICD-10-CM | POA: Insufficient documentation

## 2016-05-27 DIAGNOSIS — I442 Atrioventricular block, complete: Secondary | ICD-10-CM | POA: Insufficient documentation

## 2016-05-27 DIAGNOSIS — Z950 Presence of cardiac pacemaker: Secondary | ICD-10-CM | POA: Diagnosis not present

## 2016-05-27 DIAGNOSIS — Z85828 Personal history of other malignant neoplasm of skin: Secondary | ICD-10-CM | POA: Insufficient documentation

## 2016-05-27 DIAGNOSIS — G4733 Obstructive sleep apnea (adult) (pediatric): Secondary | ICD-10-CM | POA: Insufficient documentation

## 2016-05-27 DIAGNOSIS — Z87891 Personal history of nicotine dependence: Secondary | ICD-10-CM | POA: Insufficient documentation

## 2016-05-27 DIAGNOSIS — Z823 Family history of stroke: Secondary | ICD-10-CM | POA: Insufficient documentation

## 2016-05-27 DIAGNOSIS — IMO0002 Reserved for concepts with insufficient information to code with codable children: Secondary | ICD-10-CM

## 2016-05-27 LAB — CBC WITH DIFFERENTIAL/PLATELET
Basophils Absolute: 0 10*3/uL (ref 0.0–0.1)
Basophils Relative: 0.5 % (ref 0.0–3.0)
EOS ABS: 0.2 10*3/uL (ref 0.0–0.7)
EOS PCT: 3 % (ref 0.0–5.0)
HCT: 28 % — ABNORMAL LOW (ref 39.0–52.0)
Hemoglobin: 9.3 g/dL — ABNORMAL LOW (ref 13.0–17.0)
LYMPHS ABS: 1.5 10*3/uL (ref 0.7–4.0)
Lymphocytes Relative: 20.4 % (ref 12.0–46.0)
MCHC: 33.4 g/dL (ref 30.0–36.0)
MCV: 90.9 fl (ref 78.0–100.0)
MONO ABS: 0.7 10*3/uL (ref 0.1–1.0)
Monocytes Relative: 9.9 % (ref 3.0–12.0)
NEUTROS PCT: 66.2 % (ref 43.0–77.0)
Neutro Abs: 5 10*3/uL (ref 1.4–7.7)
Platelets: 225 10*3/uL (ref 150.0–400.0)
RBC: 3.08 Mil/uL — AB (ref 4.22–5.81)
RDW: 17.5 % — ABNORMAL HIGH (ref 11.5–15.5)
WBC: 7.6 10*3/uL (ref 4.0–10.5)

## 2016-05-27 NOTE — Telephone Encounter (Signed)
Am I doing any scheduling for this gentleman?

## 2016-05-27 NOTE — Progress Notes (Signed)
Subjective:    Patient ID: Richard [name], male    DOB: 10-16-1930, 82 y.o.   MRN: [id number] Right UE and LE incoordination onset 05/05/16.  CT head neg, no MRI due to PPM Admit date: 05/08/2016 Discharge date: 05/17/2016 HPI  Walks with walker at home no falls.  Has had HA which occurred prior to CVA He was readmitted with recurrent gastrointestinal bleeding on 05/21/2016 until 05/24/2016. Hemoglobin dropped into the 6 range, and required transfusion Had outpatient EGD as well as capsule endoscopy reported, per wife Pt dressing and bathing mod I Wife assists with taking clothes out of closet Patient does not drive. He does not do housework Pain Inventory Average Pain 2 Pain Right Now 0 My pain is intermittent  In the last 24 hours, has pain interfered with the following? General activity 0 Relation with others 0 Enjoyment of life 0 What TIME of day is your pain at its worst? n/a Sleep (in general) NA  Pain is worse with: n/a Pain improves with: n/a Relief from Meds: 0  Mobility walk without assistance ability to climb steps?  yes do you drive?  yes Do you have any goals in this area?  yes  Function not employed: date last employed 1994  Neuro/Psych weakness  Prior Studies Any changes since last visit?  no  Physicians involved in your care Any changes since last visit?  no   Family History  Problem Relation Age of Onset  . Thyroid disease Mother     goiter  . Pneumonia Father   . Other      Parents both died of old age; other conditions not known  . Stroke Brother   . Breast cancer Sister    Social History   Social History  . Marital status: Married    Spouse name: N/A  . Number of children: 2  . Years of education: N/A   Occupational History  . retired    Social History Main Topics  . Smoking status: Former Smoker    Packs/day: 0.00    Years: 20.00    Types: Cigarettes  . Smokeless tobacco: Never Used     Comment: 04/06/2013 "quit smoking  years ago; didn't smoke that much; probably 20 years; 1ppd"  . Alcohol use No  . Drug use: No  . Sexual activity: No   Other Topics Concern  . None   Social History Narrative  . None   Past Surgical History:  Procedure Laterality Date  . CARDIAC CATHETERIZATION     by Dr. Acie Fredrickson, January 24, 2004, that shows minimal coronary artery irregularities and normal left ventricular function  . CATARACT EXTRACTION W/ INTRAOCULAR LENS  IMPLANT, BILATERAL Bilateral   . COLONOSCOPY WITH PROPOFOL N/A 05/23/2016   Procedure: COLONOSCOPY WITH PROPOFOL;  Surgeon: Jerene Bears, MD;  Location: [place];  Service: Gastroenterology;  Laterality: N/A;  . ESOPHAGOGASTRODUODENOSCOPY N/A 05/22/2016   Procedure: ESOPHAGOGASTRODUODENOSCOPY (EGD);  Surgeon: Jerene Bears, MD;  Location[facility] ENDOSCOPY;  Service: Endoscopy;  Laterality: N/A;  . ESOPHAGOGASTRODUODENOSCOPY (EGD) WITH PROPOFOL N/A 06/21/2015   Procedure: ESOPHAGOGASTRODUODENOSCOPY (EGD) WITH PROPOFOL;  Surgeon: Gatha Mayer, MD;  Location: [facility] ENDOSCOPY;  Service: Endoscopy;  Laterality: N/A;  . GIVENS CAPSULE STUDY N/A 05/23/2016   Procedure: GIVENS CAPSULE STUDY;  Surgeon: Jerene Bears, MD;  Location: [place];  Service: Gastroenterology;  Laterality: N/A;  . INGUINAL HERNIA REPAIR Right   . LUMBAR DISC SURGERY  ~ 1993  . PERMANENT PACEMAKER INSERTION N/A 06/03/2014  MDT Adapta L implanted by Dr Rayann Heman for syncope and transient AV block  . SKIN CANCER EXCISION     "lower lip" (04/06/2013)  . TEMPORARY PACEMAKER INSERTION N/A 05/31/2014   Procedure: TEMPORARY WIRE;  Surgeon: Sinclair Grooms, MD;  Location: [facility] CATH LAB;  Service: Cardiovascular;  Laterality: N/A;  . VASECTOMY     Hx of    Past Medical History:  Diagnosis Date  . Anxiety   . Arthritis   . Atrial fibrillation ([place])    a. Dx 03/2013, notes report atrial fibrillation/atrial flutter, placed on amiodarone. NSR in subsequent OV's.  . B12 deficiency anemia   . Chest pain    a.  H/o CTA negative for PE 2012, normal cath 2005, normal nucs previously including 05/2012.  Marland Kitchen Complete heart block (HCC)    a. s/p Medtronic Adapta L model ADDRL 1 (serial number [id number]) pacemaker.  Marland Kitchen GERD (gastroesophageal reflux disease)   . Hiatal hernia   . Hyperlipidemia   . Hypertension   . Iron deficiency anemia   . LBBB (left bundle branch block)   . Microcytic anemia   . Nephrolithiasis   . On home oxygen therapy    a. 2L w/CPAP at night  . On home oxygen therapy    "w/his CPAP at night; don't know how much" (05/21/2016)  . Orthostatic hypotension   . OSA on CPAP   . [place] spotted fever ~ 1945  . Sinus drainage   . Skin cancer of lip   . Type II diabetes mellitus (HCC)    BP 114/63   Pulse 72   Ht 6\' 3"  (1.905 m)   Wt 240 lb (108.9 kg)   SpO2 95%   BMI 30.00 kg/m   Opioid Risk Score:   Fall Risk Score:  `1  Depression screen PHQ 2/9  Depression screen PHQ 2/9 05/27/2016  Decreased Interest 0  Down, Depressed, Hopeless 0  PHQ - 2 Score 0  Altered sleeping 0  Tired, decreased energy 3  Change in appetite 0  Feeling bad or failure about yourself  0  Trouble concentrating 1  Moving slowly or fidgety/restless 0  Suicidal thoughts 0  PHQ-9 Score 4     Review of Systems  Constitutional: Positive for unexpected weight change.  Respiratory: Positive for apnea.   Gastrointestinal: Positive for constipation.  Endocrine:       High blood sugar  Neurological: Positive for weakness.  All other systems reviewed and are negative.      Objective:   Physical Exam  Constitutional: He is oriented to person, place, and time. He appears well-developed and well-nourished.  HENT:  Head: Normocephalic and atraumatic.  Eyes: Conjunctivae and EOM are normal. Pupils are equal, round, and reactive to light.  Neck: Normal range of motion.  Cardiovascular: Normal rate, regular rhythm and normal heart sounds.   No murmur heard. Pulmonary/Chest: Effort normal  and breath sounds normal. No respiratory distress. He has no wheezes.  Abdominal: Soft. Bowel sounds are normal. He exhibits no distension. There is no tenderness.  Neurological: He is alert and oriented to person, place, and time.  Psychiatric: He has a normal mood and affect. Thought content normal. His speech is delayed and slurred. He exhibits abnormal recent memory. He is inattentive.  Nursing note and vitals reviewed.         Assessment & Plan:  1.  Right cerebellar CVA . He has right sided dysmetria as well as truncal ataxia affecting his  gait. He has evidence of periventricular white matter changes, which are also causing chronic cognitive deficits with memory and attention. F/u with Neuro this Thursday  Home health PT and OT have been recertified but have not restarted since most recent hospitalization.  Recommend outpatient physical therapy to work on balance and ambulation, patient does not want to pursue this. However, he will walk with a walker at home. No driving  Physical medicine and rehabilitation follow-up on an as-needed basis. Wife has been instructed to call the office should the patient wish to attend outpatient physical therapy.  2. Diabetes. Patient will follow up with his primary care doctor on Monday, October 9. Patient's blood sugars are reported to be running around 130                 3. Gastrointestinal bleeding has a GI follow-up, either this week or next week . 4. Atrial fibrillation on amiodarone, off anticoagulants due to recurrent gastrointestinal bleeding

## 2016-05-27 NOTE — Patient Instructions (Signed)
Please call the office if you decide to pursue some outpatient physical therapy to work on balance and walking

## 2016-05-27 NOTE — Telephone Encounter (Signed)
Agree with note by Theodosia Quay, RN. Will hold xarelto and asa for a week. Will look for further guidance from GI

## 2016-05-28 ENCOUNTER — Other Ambulatory Visit: Payer: Self-pay

## 2016-05-28 DIAGNOSIS — D500 Iron deficiency anemia secondary to blood loss (chronic): Secondary | ICD-10-CM

## 2016-05-28 DIAGNOSIS — K5520 Angiodysplasia of colon without hemorrhage: Secondary | ICD-10-CM

## 2016-05-28 NOTE — Telephone Encounter (Signed)
Patient notified  He is scheduled for Monday 06/02/16 8:00.  He is aware to arrive at 6:30 and be NPO after midnight

## 2016-05-28 NOTE — Telephone Encounter (Signed)
Please try to find a MAC spot next week - for small bowel enteroscopy with APC Either hospital ok  thanks

## 2016-05-29 ENCOUNTER — Encounter: Payer: Self-pay | Admitting: Neurology

## 2016-05-29 ENCOUNTER — Ambulatory Visit (INDEPENDENT_AMBULATORY_CARE_PROVIDER_SITE_OTHER): Payer: Medicare Other | Admitting: Neurology

## 2016-05-29 VITALS — BP 119/60 | HR 60 | Ht 75.0 in | Wt 240.0 lb

## 2016-05-29 DIAGNOSIS — I639 Cerebral infarction, unspecified: Secondary | ICD-10-CM | POA: Diagnosis not present

## 2016-05-29 DIAGNOSIS — K922 Gastrointestinal hemorrhage, unspecified: Secondary | ICD-10-CM | POA: Diagnosis not present

## 2016-05-29 DIAGNOSIS — E1159 Type 2 diabetes mellitus with other circulatory complications: Secondary | ICD-10-CM

## 2016-05-29 DIAGNOSIS — I10 Essential (primary) hypertension: Secondary | ICD-10-CM

## 2016-05-29 DIAGNOSIS — I480 Paroxysmal atrial fibrillation: Secondary | ICD-10-CM | POA: Diagnosis not present

## 2016-05-29 NOTE — Patient Instructions (Addendum)
-   continue to be off Xarelto and ASA until you see Dr. Carlean Purl Monday - recommend to switch from Xarelto to Eliquis 5mg  twice a day as Eliquis may have lower risk of GI bleeding - recommend to continue ASA with eliquis for stroke prevention once able to start those medications. - continue follow up with Cardiology Dr. Johnsie Cancel and also discuss about switch from Xarelto to Eliquis - Follow up with your primary care physician for stroke risk factor modification. Recommend maintain blood pressure goal <130/80, diabetes with hemoglobin A1c goal below 7.0% and lipids with LDL cholesterol goal below 70 mg/dL.  - check BP and glucose at home - follow up in 3 months.

## 2016-05-30 ENCOUNTER — Encounter (HOSPITAL_COMMUNITY): Payer: Self-pay | Admitting: *Deleted

## 2016-05-30 NOTE — Progress Notes (Signed)
Jill endo aware unable to reach pt.

## 2016-05-30 NOTE — Progress Notes (Signed)
STROKE NEUROLOGY FOLLOW UP NOTE  NAME: Richard [name] DOB: 04-26-1931  REASON FOR VISIT: stroke follow up HISTORY FROM: wife and chart  Today we had the pleasure of seeing Richard [name] in follow-up at our Neurology Clinic. Pt was accompanied by wife.   History Summary Richard [name] is a 82 y.o. male with history of A. fib on Xarelto, complete heart block s/p pacemaker, HLD, HTN, OSA on CPAP, DM admitted on 05/05/16 for acute onset vertigo and diplopia. Symptoms improved on admission. CTA head and neck unremarkable and repeat CT negative. His stroke was considered as posterior circulation DWI negative stroke although afib as etiology can not excluded. EF 60-65%, LDL 97 and A1C 5.7. He was continued on Xarelto and added ASA 81mg  for further stroke prevention. lipitor 40mg  also added on discharge. He was discharged to CIR.   He was readmitted on 05/21/16 for anemia with generalized weakness. Hb down to 6.9 and he received 2 units PRBCs. EGD showed erosive gastritis but no bleeding source. Colonoscopy showed 2 polyps but no bleeding source. He was undergoing capsule endoscopy at small bowel showed likely a bleeding source and he was asked to follow up on 06/02/16 for further procedure with Dr. Carlean Purl. His Xarlelto and ASA were currently on hold.   Interval History During the interval time, the patient has been doing well from stroke standpoint. His diplopia and vertigo have been resolved. He is currently still has b/l LE ataxia and walking with cane with somehow unsteady gait, however, this is likely chronic. He is not on Xarelto or ASA due to GIB. Waiting for next week procedure. BP today 119/60.     REVIEW OF SYSTEMS: Full 14 system review of systems performed and notable only for those listed below and in HPI above, all others are negative:  Constitutional:   Cardiovascular:  Ear/Nose/Throat:  Hearing loss Skin:  Eyes:  Eye pain Respiratory:   Gastroitestinal:  Black  stool Genitourinary:  Hematology/Lymphatic:  Bruise/bleeding easily, anemia Endocrine:  Musculoskeletal:  Back pain, walking difficulty Allergy/Immunology:   Neurological:  HA Psychiatric:  Sleep: insomnia, apnea  The following represents the patient's updated allergies and side effects list: Allergies  Allergen Reactions  . Codeine Other (See Comments)    Hallucinations   . Citalopram Swelling and Rash    The neurologically relevant items on the patient's problem list were reviewed on today's visit.  Neurologic Examination  A problem focused neurological exam (12 or more points of the single system neurologic examination, vital signs counts as 1 point, cranial nerves count for 8 points) was performed.  Blood pressure 119/60, pulse 60, height 6\' 3"  (1.905 m), weight 240 lb (108.9 kg).  General - Well nourished, well developed, in no apparent distress.  Ophthalmologic - Fundi not visualized due to eye movement.  Cardiovascular - Regular rate and rhythm, not in afib.  Mental Status -  Level of arousal and orientation to time, place, and person were intact. Language including expression, naming, repetition, comprehension was assessed and found intact. Fund of Knowledge was assessed and was impaired.  Cranial Nerves II - XII - II - Visual field intact OU. III, IV, VI - Extraocular movements intact. V - Facial sensation intact bilaterally. VII - Facial movement intact bilaterally. VIII - Hard of hearing & vestibular intact bilaterally. X - Palate elevates symmetrically. XI - Chin turning & shoulder shrug intact bilaterally. XII - Tongue protrusion intact.  Motor Strength - The patient's strength was normal in  all extremities and pronator drift was absent.  Bulk was normal and fasciculations were absent.   Motor Tone - Muscle tone was assessed at the neck and appendages and was normal.  Reflexes - The patient's reflexes were 1+ in all extremities and he had no  pathological reflexes.  Sensory - Light touch, temperature/pinprick were assessed and were normal.    Coordination - The patient had normal movements in the hands, however, b/l ataxia on HTS test.  Tremor was absent.  Gait and Station - walk with cane, unsteady gait, en bloc turns.   Functional score  mRS = 2   0 - No symptoms.   1 - No significant disability. Able to carry out all usual activities, despite some symptoms.   2 - Slight disability. Able to look after own affairs without assistance, but unable to carry out all previous activities.   3 - Moderate disability. Requires some help, but able to walk unassisted.   4 - Moderately severe disability. Unable to attend to own bodily needs without assistance, and unable to walk unassisted.   5 - Severe disability. Requires constant nursing care and attention, bedridden, incontinent.   6 - Dead.   NIH Stroke Scale   Level Of Consciousness 0=Alert; keenly responsive 1=Not alert, but arousable by minor stimulation 2=Not alert, requires repeated stimulation 3=Responds only with reflex movements 0  LOC Questions to Month and Age 75=Answers both questions correctly 1=Answers one question correctly 2=Answers neither question correctly 0  LOC Commands      -Open/Close eyes     -Open/close grip 0=Performs both tasks correctly 1=Performs one task correctly 2=Performs neighter task correctly 0  Best Gaze 0=Normal 1=Partial gaze palsy 2=Forced deviation, or total gaze paresis 0  Visual 0=No visual loss 1=Partial hemianopia 2=Complete hemianopia 3=Bilateral hemianopia (blind including cortical blindness) 0  Facial Palsy 0=Normal symmetrical movement 1=Minor paralysis (asymmetry) 2=Partial paralysis (lower face) 3=Complete paralysis (upper and lower face) 0  Motor  0=No drift, limb holds posture for full 10 seconds 1=Drift, limb holds posture, no drift to bed 2=Some antigravity effort, cannot maintain posture, drifts to  bed 3=No effort against gravity, limb falls 4=No movement Right Arm 0     Leg 0    Left Arm 0     Leg 0  Limb Ataxia 0=Absent 1=Present in one limb 2=Present in two limbs 2  Sensory 0=Normal 1=Mild to moderate sensory loss 2=Severe to total sensory loss 0  Best Language 0=No aphasia, normal 1=Mild to moderate aphasia 2=Mute, global aphasia 3=Mute, global aphasia 0  Dysarthria 0=Normal 1=Mild to moderate 2=Severe, unintelligible or mute/anarthric 1  Extinction/Neglect 0=No abnormality 1=Extinction to bilateral simultaneous stimulation 2=Profound neglect 0  Total   3     Data reviewed: I personally reviewed the images and agree with the radiology interpretations.  Ct Angio Head And neck W/cm &/or Wo Cm 05/05/2016 IMPRESSION: No occlusion, aneurysm, dissection, or significant stenosis of the circle of Willis or carotid and vertebral arteries of the neck is identified. Mild mixed plaque of the carotid bifurcations and intracranial calcific atherosclerosis.   Ct Head Wo Contrast 05/05/2016 IMPRESSION: 1. Evidence of progressed chronic small vessel disease in the left thalamus since 2015. Underlying chronic white matter disease. 2.  No acute intracranial abnormality identified.   05/08/2016 1. No acute intracranial abnormality. 2. Chronic microvascular ischemia.  2D Echocardiogram  - Left ventricle: Abnormal septal motion The cavity size was normal. Wall thickness was increased in a pattern of mild LVH. Systolic function  was normal. The estimated ejection fraction was in the range of 60% to 65%. Wall motion was normal; there were no regional wall motion abnormalities. - Mitral valve: There was mild regurgitation. Valve area by continuity equation (using LVOT flow): 3.11 cm^2. - Left atrium: The atrium was moderately dilated. - Right atrium: ? calcified chiari network in RA. - Atrial septum: No defect or patent foramen ovale was identified.  EKG  sinus  rhythm  Component     Latest Ref Rng & Units 05/05/2016 05/06/2016  Cholesterol     0 - 200 mg/dL  136  Triglycerides     <150 mg/dL  70  HDL Cholesterol     >40 mg/dL  38 (L)  Total CHOL/HDL Ratio     RATIO  3.6  VLDL     0 - 40 mg/dL  14  LDL (calc)     0 - 99 mg/dL  84  Hemoglobin A1C     4.8 - 5.6 % 5.8 (H) 5.7 (H)  Mean Plasma Glucose     mg/dL 120 117    Assessment: As you may recall, he is a 82 y.o. Caucasian male with PMH of  A. fib on Xarelto, complete heart block s/p pacemaker, HLD, HTN, OSA on CPAP, DM admitted on 05/05/16 for acute onset vertigo and diplopia. Symptoms improved on admission. CTA head and neck unremarkable and repeat CT negative. His stroke was considered as posterior circulation DWI negative stroke although afib as etiology can not excluded. EF 60-65%, LDL 97 and A1C 5.7. He was continued on Xarelto and added ASA 81mg  and lipitor for further stroke prevention. He was readmitted on 05/21/16 for anemia with Hb down to 6.9 and he received 2 units PRBCs. EGD and colonoscopy negative for bleeding source. Capsule endoscopy at small bowel showed likely a bleeding source and he was put on 06/02/16 for further procedure with Dr. Carlean Purl. His Xarlelto and ASA were currently on hold. During the interval time, the patient has been doing well from stroke standpoint. His diplopia and vertigo have been resolved. He is currently still has b/l LE ataxia and walking with cane with somehow unsteady gait, however, this is likely chronic. Recommend to consider eliquis and ASA at the time of resuming antithrombotic therapy.  Plan:  - continue to be off Xarelto and ASA until you see Dr. Carlean Purl Monday - recommend to switch from Xarelto to Eliquis 5mg  twice a day at the time of resuming anticoagulation as Eliquis may have lower risk of GI bleeding - recommend to continue ASA with eliquis for stroke prevention once able to start those medications. - continue follow up with Cardiology Dr.  Johnsie Cancel and also discuss about switch from Xarelto to Eliquis - Follow up with your primary care physician for stroke risk factor modification. Recommend maintain blood pressure goal <130/80, diabetes with hemoglobin A1c goal below 7.0% and lipids with LDL cholesterol goal below 70 mg/dL.  - check BP and glucose at home - follow up in 3 months.   I spent more than 25 minutes of face to face time with the patient. Greater than 50% of time was spent in counseling and coordination of care. We discussed anticoagulation choices and follow up with GI and cardiology.  No orders of the defined types were placed in this encounter.   No orders of the defined types were placed in this encounter.   Patient Instructions  - continue to be off Xarelto and ASA until you see Dr. Carlean Purl Monday -  recommend to switch from Xarelto to Eliquis 5mg  twice a day as Eliquis may have lower risk of GI bleeding - recommend to continue ASA with eliquis for stroke prevention once able to start those medications. - continue follow up with Cardiology Dr. Johnsie Cancel and also discuss about switch from Xarelto to Eliquis - Follow up with your primary care physician for stroke risk factor modification. Recommend maintain blood pressure goal <130/80, diabetes with hemoglobin A1c goal below 7.0% and lipids with LDL cholesterol goal below 70 mg/dL.  - check BP and glucose at home - follow up in 3 months.    Rosalin Hawking, MD PhD [facility] [address] [place], [place] [id number] [phone]

## 2016-06-02 ENCOUNTER — Ambulatory Visit (HOSPITAL_COMMUNITY): Payer: Medicare Other | Admitting: Certified Registered"

## 2016-06-02 ENCOUNTER — Ambulatory Visit (HOSPITAL_COMMUNITY)
Admission: RE | Admit: 2016-06-02 | Discharge: 2016-06-02 | Disposition: A | Discharge status: Home / Self Care | Payer: Medicare Other | Source: Ambulatory Visit | Attending: Internal Medicine | Admitting: Internal Medicine

## 2016-06-02 ENCOUNTER — Encounter (HOSPITAL_COMMUNITY)
Admission: RE | Disposition: A | Discharge status: Home / Self Care | Payer: Self-pay | Source: Ambulatory Visit | Attending: Internal Medicine

## 2016-06-02 ENCOUNTER — Encounter (HOSPITAL_COMMUNITY): Payer: Self-pay | Admitting: *Deleted

## 2016-06-02 ENCOUNTER — Encounter: Payer: Self-pay | Admitting: Internal Medicine

## 2016-06-02 DIAGNOSIS — I129 Hypertensive chronic kidney disease with stage 1 through stage 4 chronic kidney disease, or unspecified chronic kidney disease: Secondary | ICD-10-CM | POA: Diagnosis not present

## 2016-06-02 DIAGNOSIS — D649 Anemia, unspecified: Secondary | ICD-10-CM | POA: Diagnosis not present

## 2016-06-02 DIAGNOSIS — I4892 Unspecified atrial flutter: Secondary | ICD-10-CM | POA: Insufficient documentation

## 2016-06-02 DIAGNOSIS — Z87891 Personal history of nicotine dependence: Secondary | ICD-10-CM | POA: Diagnosis not present

## 2016-06-02 DIAGNOSIS — Z85828 Personal history of other malignant neoplasm of skin: Secondary | ICD-10-CM | POA: Diagnosis not present

## 2016-06-02 DIAGNOSIS — G4733 Obstructive sleep apnea (adult) (pediatric): Secondary | ICD-10-CM | POA: Insufficient documentation

## 2016-06-02 DIAGNOSIS — I10 Essential (primary) hypertension: Secondary | ICD-10-CM | POA: Diagnosis not present

## 2016-06-02 DIAGNOSIS — D500 Iron deficiency anemia secondary to blood loss (chronic): Secondary | ICD-10-CM | POA: Insufficient documentation

## 2016-06-02 DIAGNOSIS — K317 Polyp of stomach and duodenum: Secondary | ICD-10-CM | POA: Diagnosis not present

## 2016-06-02 DIAGNOSIS — E119 Type 2 diabetes mellitus without complications: Secondary | ICD-10-CM | POA: Insufficient documentation

## 2016-06-02 DIAGNOSIS — K558 Other vascular disorders of intestine: Secondary | ICD-10-CM | POA: Diagnosis not present

## 2016-06-02 DIAGNOSIS — I447 Left bundle-branch block, unspecified: Secondary | ICD-10-CM | POA: Diagnosis not present

## 2016-06-02 DIAGNOSIS — I442 Atrioventricular block, complete: Secondary | ICD-10-CM | POA: Diagnosis not present

## 2016-06-02 DIAGNOSIS — Z9981 Dependence on supplemental oxygen: Secondary | ICD-10-CM | POA: Insufficient documentation

## 2016-06-02 DIAGNOSIS — Z8673 Personal history of transient ischemic attack (TIA), and cerebral infarction without residual deficits: Secondary | ICD-10-CM | POA: Insufficient documentation

## 2016-06-02 DIAGNOSIS — E785 Hyperlipidemia, unspecified: Secondary | ICD-10-CM | POA: Diagnosis not present

## 2016-06-02 DIAGNOSIS — M1990 Unspecified osteoarthritis, unspecified site: Secondary | ICD-10-CM | POA: Diagnosis not present

## 2016-06-02 DIAGNOSIS — Z87442 Personal history of urinary calculi: Secondary | ICD-10-CM | POA: Diagnosis not present

## 2016-06-02 DIAGNOSIS — I951 Orthostatic hypotension: Secondary | ICD-10-CM | POA: Diagnosis not present

## 2016-06-02 DIAGNOSIS — K921 Melena: Secondary | ICD-10-CM | POA: Insufficient documentation

## 2016-06-02 DIAGNOSIS — G4730 Sleep apnea, unspecified: Secondary | ICD-10-CM | POA: Diagnosis not present

## 2016-06-02 DIAGNOSIS — K2970 Gastritis, unspecified, without bleeding: Secondary | ICD-10-CM | POA: Diagnosis not present

## 2016-06-02 DIAGNOSIS — Z950 Presence of cardiac pacemaker: Secondary | ICD-10-CM | POA: Diagnosis not present

## 2016-06-02 DIAGNOSIS — K449 Diaphragmatic hernia without obstruction or gangrene: Secondary | ICD-10-CM | POA: Insufficient documentation

## 2016-06-02 DIAGNOSIS — F419 Anxiety disorder, unspecified: Secondary | ICD-10-CM | POA: Diagnosis not present

## 2016-06-02 DIAGNOSIS — Z803 Family history of malignant neoplasm of breast: Secondary | ICD-10-CM | POA: Diagnosis not present

## 2016-06-02 DIAGNOSIS — K5520 Angiodysplasia of colon without hemorrhage: Secondary | ICD-10-CM

## 2016-06-02 DIAGNOSIS — D519 Vitamin B12 deficiency anemia, unspecified: Secondary | ICD-10-CM | POA: Insufficient documentation

## 2016-06-02 DIAGNOSIS — K219 Gastro-esophageal reflux disease without esophagitis: Secondary | ICD-10-CM | POA: Insufficient documentation

## 2016-06-02 DIAGNOSIS — I4891 Unspecified atrial fibrillation: Secondary | ICD-10-CM | POA: Insufficient documentation

## 2016-06-02 HISTORY — PX: ENTEROSCOPY: SHX5533

## 2016-06-02 HISTORY — PX: HOT HEMOSTASIS: SHX5433

## 2016-06-02 LAB — GLUCOSE, CAPILLARY
GLUCOSE-CAPILLARY: 139 mg/dL — AB (ref 65–99)
Glucose-Capillary: 130 mg/dL — ABNORMAL HIGH (ref 65–99)

## 2016-06-02 SURGERY — ENTEROSCOPY
Anesthesia: Monitor Anesthesia Care

## 2016-06-02 MED ORDER — ONDANSETRON HCL 4 MG/2ML IJ SOLN
INTRAMUSCULAR | Status: DC | PRN
  Administered 2016-06-02: 4 mg via INTRAVENOUS

## 2016-06-02 MED ORDER — SODIUM CHLORIDE 0.9 % IV SOLN: INTRAVENOUS | Status: DC

## 2016-06-02 MED ORDER — ONDANSETRON HCL 4 MG/2ML IJ SOLN
INTRAMUSCULAR | Status: AC
  Filled 2016-06-02: qty 2

## 2016-06-02 MED ORDER — PROPOFOL 10 MG/ML IV BOLUS
INTRAVENOUS | Status: AC
  Filled 2016-06-02: qty 40

## 2016-06-02 MED ORDER — PROPOFOL 10 MG/ML IV BOLUS
INTRAVENOUS | Status: DC | PRN
  Administered 2016-06-02 (×12): 20 mg via INTRAVENOUS

## 2016-06-02 MED ORDER — LACTATED RINGERS IV SOLN
INTRAVENOUS | Status: DC
  Administered 2016-06-02: 08:00:00 via INTRAVENOUS
  Administered 2016-06-02: 1000 mL via INTRAVENOUS

## 2016-06-02 NOTE — Discharge Instructions (Addendum)
° °  I did not fine the bleeding source today. Possibilities are that I was far enough down but did not see it, it resolved on its own, or I did not get far enough down.  Options are to stay off blood thinners, go to another place with a longer scope, resume blood thinners and hope bleeding does not recur.  Will discuss with your other doctors.  Would also recommend you go back to Dr. Whitney Muse to get some more iron injections.  I appreciate the opportunity to care for you. Gatha Mayer, MD, FACG  YOU HAD AN ENDOSCOPIC PROCEDURE TODAY: Refer to the procedure report and other information in the discharge instructions given to you for any specific questions about what was found during the examination. If this information does not answer your questions, please call Dr. Celesta Aver office at [phone] to clarify.   YOU SHOULD EXPECT: Some feelings of bloating in the abdomen. Passage of more gas than usual. Walking can help get rid of the air that was put into your GI tract during the procedure and reduce the bloating. If you had a lower endoscopy (such as a colonoscopy or flexible sigmoidoscopy) you may notice spotting of blood in your stool or on the toilet paper. Some abdominal soreness may be present for a day or two, also.  DIET: Your first meal following the procedure should be a light meal and then it is ok to progress to your normal diet. A half-sandwich or bowl of soup is an example of a good first meal. Heavy or fried foods are harder to digest and may make you feel nauseous or bloated. Drink plenty of fluids but you should avoid alcoholic beverages for 24 hours.   ACTIVITY: Your care partner should take you home directly after the procedure. You should plan to take it easy, moving slowly for the rest of the day. You can resume normal activity the day after the procedure however YOU SHOULD NOT DRIVE, use power tools, machinery or perform tasks that involve climbing or major physical  exertion for 24 hours (because of the sedation medicines used during the test).   SYMPTOMS TO REPORT IMMEDIATELY: A gastroenterologist can be reached at any hour. Please call [phone]  for any of the following symptoms:  Following lower endoscopy (colonoscopy, flexible sigmoidoscopy) Excessive amounts of blood in the stool  Significant tenderness, worsening of abdominal pains  Swelling of the abdomen that is new, acute  Fever of 100 or higher  Following upper endoscopy (EGD, EUS, ERCP, esophageal dilation) Vomiting of blood or coffee ground material  New, significant abdominal pain  New, significant chest pain or pain under the shoulder blades  Painful or persistently difficult swallowing  New shortness of breath  Black, tarry-looking or red, bloody stools  FOLLOW UP:  If any biopsies were taken you will be contacted by phone or by letter within the next 1-3 weeks. Call [phone]  if you have not heard about the biopsies in 3 weeks.  Please also call with any specific questions about appointments or follow up tests.

## 2016-06-02 NOTE — H&P (Signed)
[facility] History and Physical   Primary Care Physician:  Tivis Ringer, MD   Reason for Procedure:   evaluate and treat GI bleeding - seen at small bowel capsule endoscopy  Plan:    Push enteroscopy and bleeding treatment     HPI: Richard [name] is a 82 y.o. male with recent stroke and treatment with Xarelto and Eliquis - he was recently admitted with GI beeding - negative EGD/colonoscopy. Had bleeding about 40 mins in on small bowel capsule endoscopy so is here to see if I can find and treat that area and resuke anticoag Tx.   Past Medical History:  Diagnosis Date  . Anxiety   . Arthritis   . Atrial fibrillation ([place])    a. Dx 03/2013, notes report atrial fibrillation/atrial flutter, placed on amiodarone. NSR in subsequent OV's.  . B12 deficiency anemia   . Chest pain    a. H/o CTA negative for PE 2012, normal cath 2005, normal nucs previously including 05/2012.  Marland Kitchen Complete heart block (HCC)    a. s/p Medtronic Adapta L model ADDRL 1 (serial number [id number]) pacemaker.  Marland Kitchen GERD (gastroesophageal reflux disease)   . Hiatal hernia   . Hyperlipidemia   . Hypertension   . Iron deficiency anemia   . LBBB (left bundle branch block)   . Microcytic anemia   . Nephrolithiasis   . On home oxygen therapy    a. 2L w/CPAP at night  . On home oxygen therapy    "w/his CPAP at night; don't know how much" (05/21/2016)  . Orthostatic hypotension   . OSA on CPAP   . [place] spotted fever ~ 1945  . Sinus drainage   . Skin cancer of lip   . Stroke ([place])   . Type II diabetes mellitus ([place])     Past Surgical History:  Procedure Laterality Date  . CARDIAC CATHETERIZATION     by Dr. Acie Fredrickson, January 24, 2004, that shows minimal coronary artery irregularities and normal left ventricular function  . CATARACT EXTRACTION W/ INTRAOCULAR LENS  IMPLANT, BILATERAL Bilateral   . COLONOSCOPY WITH PROPOFOL N/A 05/23/2016   Procedure: COLONOSCOPY WITH PROPOFOL;  Surgeon:  Jerene Bears, MD;  Location: [place];  Service: Gastroenterology;  Laterality: N/A;  . ESOPHAGOGASTRODUODENOSCOPY N/A 05/22/2016   Procedure: ESOPHAGOGASTRODUODENOSCOPY (EGD);  Surgeon: Jerene Bears, MD;  Location: [facility] ENDOSCOPY;  Service: Endoscopy;  Laterality: N/A;  . ESOPHAGOGASTRODUODENOSCOPY (EGD) WITH PROPOFOL N/A 06/21/2015   Procedure: ESOPHAGOGASTRODUODENOSCOPY (EGD) WITH PROPOFOL;  Surgeon: Gatha Mayer, MD;  Location: [facility] ENDOSCOPY;  Service: Endoscopy;  Laterality: N/A;  . GIVENS CAPSULE STUDY N/A 05/23/2016   Procedure: GIVENS CAPSULE STUDY;  Surgeon: Jerene Bears, MD;  Location: [place];  Service: Gastroenterology;  Laterality: N/A;  . INGUINAL HERNIA REPAIR Right   . LUMBAR DISC SURGERY  ~ 1993  . PERMANENT PACEMAKER INSERTION N/A 06/03/2014   MDT Adapta L implanted by Dr Rayann Heman for syncope and transient AV block  . SKIN CANCER EXCISION     "lower lip" (04/06/2013)  . TEMPORARY PACEMAKER INSERTION N/A 05/31/2014   Procedure: TEMPORARY WIRE;  Surgeon: Sinclair Grooms, MD;  Location: [facility] CATH LAB;  Service: Cardiovascular;  Laterality: N/A;  . VASECTOMY     Hx of     Prior to Admission medications   Medication Sig Start Date End Date Taking? Authorizing Provider  acetaminophen (TYLENOL) 500 MG tablet Take 500 mg by mouth 2 (two) times daily.  Yes Historical Provider, MD  amiodarone (PACERONE) 200 MG tablet Take 200 mg by mouth 3 (three) times a week. On Monday, Wednesday & Friday   Yes Historical Provider, MD  amLODipine (NORVASC) 10 MG tablet Take 1 tablet (10 mg total) by mouth daily. 05/16/16  Yes Ivan Anchors Love, PA-C  atorvastatin (LIPITOR) 40 MG tablet Take 1 tablet (40 mg total) by mouth daily at 6 PM. 05/16/16  Yes Ivan Anchors Love, PA-C  cyanocobalamin (,VITAMIN B-12,) 1000 MCG/ML injection Inject 1,000 mcg into the muscle every 30 (thirty) days.   Yes Historical Provider, MD  fluticasone (FLONASE) 50 MCG/ACT nasal spray Place 1 spray into the nose daily as needed  for allergies or rhinitis.    Yes Historical Provider, MD  isosorbide mononitrate (IMDUR) 30 MG 24 hr tablet Take 1 tablet (30 mg total) by mouth daily. 05/25/16  Yes Kelvin Cellar, MD  metoprolol (LOPRESSOR) 50 MG tablet TAKE 1 TABLET BY MOUTH TWICE A DAY 07/06/15  Yes Thayer Headings, MD  pantoprazole (PROTONIX) 40 MG tablet Take 1 tablet (40 mg total) by mouth daily at 6 (six) AM. 05/25/16  Yes Kelvin Cellar, MD  pioglitazone (ACTOS) 30 MG tablet Take 30 mg by mouth daily.     Yes Historical Provider, MD  sertraline (ZOLOFT) 100 MG tablet Take 1 tablet by mouth daily. 08/21/14  Yes Historical Provider, MD    Current Facility-Administered Medications  Medication Dose Route Frequency Provider Last Rate Last Dose  . 0.9 %  sodium chloride infusion   Intravenous Continuous Gatha Mayer, MD      . lactated ringers infusion   Intravenous Continuous Gatha Mayer, MD 10 mL/hr at 06/02/16 0704 1,000 mL at 06/02/16 0704    Allergies as of 05/28/2016 - Review Complete 05/27/2016  Allergen Reaction Noted  . Codeine Other (See Comments) 06/12/2011  . Citalopram Swelling and Rash 05/31/2014    Family History  Problem Relation Age of Onset  . Thyroid disease Mother     goiter  . Pneumonia Father   . Other      Parents both died of old age; other conditions not known  . Stroke Brother   . Breast cancer Sister     Social History   Social History  . Marital status: Married    Spouse name: N/A  . Number of children: 2  . Years of education: N/A   Occupational History  . retired    Social History Main Topics  . Smoking status: Former Smoker    Packs/day: 0.00    Years: 20.00    Types: Cigarettes  . Smokeless tobacco: Never Used     Comment: 04/06/2013 "quit smoking years ago; didn't smoke that much; probably 20 years; 1ppd"  . Alcohol use No  . Drug use: No  . Sexual activity: No   Other Topics Concern  . Not on file   Social History Narrative  . No narrative on file     Review of Systems: Positive for recent stroke - cerebellar All other review of systems negative except as mentioned in the HPI.  Physical Exam: Vital signs in last 24 hours: Temp:  [98.3 F (36.8 C)] 98.3 F (36.8 C) (10/09 0645) Pulse Rate:  [60] 60 (10/09 0645) Resp:  [10] 10 (10/09 0645) BP: (159)/(64) 159/64 (10/09 0645) SpO2:  [97 %] 97 % (10/09 0645) Weight:  [240 lb (108.9 kg)] 240 lb (108.9 kg) (10/09 0645)   General:   Alert,  Well-developed, well-nourished,  pleasant and cooperative in NAD Lungs:  Clear throughout to auscultation.   Heart:  Regular rate and rhythm; no murmurs, clicks, rubs,  or gallops. Abdomen:  Soft, nontender and nondistended. Normal bowel sounds.   Neuro/Psych:  Alert and cooperative. Normal mood and affect. A and O x 3   @Carl  Simonne Maffucci, MD, [facility] [phone] (pager) 06/02/2016 8:11 AM@

## 2016-06-02 NOTE — Op Note (Signed)
[facility] Patient Name: Richard [name] Procedure Date: 06/02/2016 MRN: [id number] Attending MD: Gatha Mayer , MD Date of Birth: 1931-07-09 CSN: [id number] Age: 82 Admit Type: Outpatient Procedure:                Small bowel enteroscopy Indications:              Iron deficiency anemia secondary to chronic blood                            loss, Melena Providers:                Gatha Mayer, MD, Hilma Favors, RN, Elspeth Cho Tech., Technician, Glenis Smoker, CRNA Referring MD:              Medicines:                Propofol per Anesthesia, Monitored Anesthesia Care Complications:            No immediate complications. Estimated Blood Loss:     Estimated blood loss: none. Procedure:                Pre-Anesthesia Assessment:                           - Prior to the procedure, a History and Physical                            was performed, and patient medications and                            allergies were reviewed. The patient's tolerance of                            previous anesthesia was also reviewed. The risks                            and benefits of the procedure and the sedation                            options and risks were discussed with the patient.                            All questions were answered, and informed consent                            was obtained. Prior Anticoagulants: The patient has                            taken no previous anticoagulant or antiplatelet                            agents. ASA Grade Assessment: III - A patient with  severe systemic disease. After reviewing the risks                            and benefits, the patient was deemed in                            satisfactory condition to undergo the procedure.                           After obtaining informed consent, the endoscope was                            passed under direct vision. Throughout the                            procedure, the patient's blood pressure, pulse, and                            oxygen saturations were monitored continuously. The                            [id number]) scope was introduced through                            the mouth and advanced to the proximal jejunum. The                            small bowel enteroscopy was accomplished without                            difficulty. The patient tolerated the procedure                            well. Scope In: Scope Out: Findings:      The examined esophagus was normal.      A large hiatal hernia was present.      Patchy moderately erythematous mucosa without bleeding was found in the       gastric body.      One small sessile polyp with no bleeding and no stigmata of recent       bleeding was found in the gastric antrum.      There was no evidence of significant pathology in the entire examined       duodenum.      There was no evidence of significant pathology in the proximal jejunum. Impression:               - Normal esophagus.                           - Large hiatal hernia.                           - Erythematous mucosa in the gastric body.                           - One gastric polyp.                           -  Normal examined duodenum.                           - The examined portion of the jejunum was normal.                           - No specimens collected.                           -                           THE SUSPECTED AVM THAT WAS SEEN BLEEDING ON CAPSULE                            ENDOSCOPY WAS NOT FOUND TODAY. ? NOT VISIBLE, NOT                            SEEN OR DEEPER THAN I WAS ABLE TO GO. Recommendation:           - Discharge patient to home (ambulatory).                           - Patient has a contact number available for                            emergencies. The signs and symptoms of potential                            delayed complications were discussed with  the                            patient. Return to normal activities tomorrow.                            Written discharge instructions were provided to the                            patient.                           - Resume previous diet. Procedure Code(s):        --- Professional ---                           [phone], Small intestinal endoscopy, enteroscopy                            beyond second portion of duodenum, not including                            ileum; diagnostic, including collection of                            specimen(s) by brushing or washing, when performed                            (  separate procedure) Diagnosis Code(s):        --- Professional ---                           K44.9, Diaphragmatic hernia without obstruction or                            gangrene                           K31.89, Other diseases of stomach and duodenum                           K31.7, Polyp of stomach and duodenum                           D50.0, Iron deficiency anemia secondary to blood                            loss (chronic)                           K92.1, Melena (includes Hematochezia) CPT copyright 2016 American Medical Association. All rights reserved. The codes documented in this report are preliminary and upon coder review may  be revised to meet current compliance requirements. Gatha Mayer, MD 06/02/2016 9:17:42 AM This report has been signed electronically. Number of Addenda: 0

## 2016-06-02 NOTE — Anesthesia Postprocedure Evaluation (Signed)
Anesthesia Post Note  Patient: Richard [name]  Procedure(s) Performed: Procedure(s) (LRB): ENTEROSCOPY (N/A) HOT HEMOSTASIS (ARGON PLASMA COAGULATION/BICAP) (N/A)  Patient location during evaluation: PACU Anesthesia Type: MAC Level of consciousness: awake and alert Pain management: pain level controlled Vital Signs Assessment: post-procedure vital signs reviewed and stable Respiratory status: spontaneous breathing, nonlabored ventilation, respiratory function stable and patient connected to nasal cannula oxygen Cardiovascular status: stable and blood pressure returned to baseline Anesthetic complications: no    Last Vitals:  Vitals:   06/02/16 0930 06/02/16 0940  BP: (!) 169/59 (!) 167/61  Pulse: 61 62  Resp: 17 15  Temp:      Last Pain:  Vitals:   06/02/16 0900  TempSrc: Oral                 Effie Berkshire

## 2016-06-02 NOTE — Transfer of Care (Signed)
Immediate Anesthesia Transfer of Care Note  Patient: [name]  Procedure(s) Performed: Procedure(s): ENTEROSCOPY (N/A) HOT HEMOSTASIS (ARGON PLASMA COAGULATION/BICAP) (N/A)  Patient Location: PACU and Endoscopy Unit  Anesthesia Type:MAC  Level of Consciousness: awake and alert   Airway & Oxygen Therapy: spont Rulo o2  Post-op Assessment: Report given to RN and Post -op Vital signs reviewed and stable  Post vital signs: Reviewed and stable  Last Vitals:  Vitals:   06/02/16 0645  BP: (!) 159/64  Pulse: 60  Resp: 10  Temp: 36.8 C    Last Pain:  Vitals:   06/02/16 0645  TempSrc: Oral         Complications: No apparent anesthesia complications

## 2016-06-02 NOTE — Anesthesia Preprocedure Evaluation (Signed)
Anesthesia Evaluation  Patient identified by MRN, date of birth, ID band Patient awake    Reviewed: Allergy & Precautions, NPO status , Patient's Chart, lab work & pertinent test results, reviewed documented beta blocker date and time   Airway Mallampati: III  TM Distance: >3 FB Neck ROM: Full    Dental  (+) Dental Advisory Given, Edentulous Upper, Edentulous Lower   Pulmonary sleep apnea and Continuous Positive Airway Pressure Ventilation , former smoker,    breath sounds clear to auscultation       Cardiovascular hypertension, Pt. on medications and Pt. on home beta blockers + dysrhythmias Atrial Fibrillation  Rhythm:Regular Rate:Normal     Neuro/Psych PSYCHIATRIC DISORDERS Anxiety  Neuromuscular disease CVA    GI/Hepatic Neg liver ROS, hiatal hernia, GERD  Medicated,  Endo/Other  diabetes, Type 2  Renal/GU CRFRenal disease  negative genitourinary   Musculoskeletal  (+) Arthritis , Osteoarthritis,    Abdominal   Peds negative pediatric ROS (+)  Hematology negative hematology ROS (+)   Anesthesia Other Findings - LBBB   Reproductive/Obstetrics negative OB ROS                             Anesthesia Physical Anesthesia Plan  ASA: III  Anesthesia Plan: MAC   Post-op Pain Management:    Induction: Intravenous  Airway Management Planned: Natural Airway  Additional Equipment:   Intra-op Plan:   Post-operative Plan:   Informed Consent: I have reviewed the patients History and Physical, chart, labs and discussed the procedure including the risks, benefits and alternatives for the proposed anesthesia with the patient or authorized representative who has indicated his/her understanding and acceptance.   Dental advisory given  Plan Discussed with: CRNA  Anesthesia Plan Comments:         Anesthesia Quick Evaluation

## 2016-06-03 ENCOUNTER — Encounter (HOSPITAL_COMMUNITY): Payer: Self-pay | Admitting: Internal Medicine

## 2016-06-03 ENCOUNTER — Telehealth: Payer: Self-pay

## 2016-06-03 NOTE — Telephone Encounter (Signed)
Patient and his wife notified they should hear from Dr. Elmarie Shiley office in the next few days.

## 2016-06-03 NOTE — Telephone Encounter (Signed)
-----   Message from Gatha Mayer, MD sent at 06/03/2016  2:37 PM EDT ----- Regarding: FW: anti-coag Tx - Fe defic and recent GI bleed Please just let patient and wife know should be hearing about starting Eliquis from Dr. Acie Fredrickson  Can you print and fax this train of messages to his PCP Dr. Berneta Sages also?  CEG ----- Message ----- From: Thayer Headings, MD Sent: 06/03/2016  12:55 PM To: Gatha Mayer, MD, Patsey Berthold, NP, # Subject: FW: anti-coag Tx - Fe defic and recent GI bl#  Yes, I have forwarded this message to my nurse .   We will prescribe Eliquis 5 mg BID.  I have sent a previous message to our EP nurse practitioner and we will see if he is a candidate for the Watchman device (which would eliminate the need for the Eliquis )    ----- Message ----- From: Gatha Mayer, MD Sent: 06/03/2016  10:11 AM To: Thayer Headings, MD, Rosalin Hawking, MD, # Subject: RE: anti-coag Tx - Fe defic and recent GI bl#  OK sounds like we have a consensus on starting Eliquis - not something I do a lot of - Phil would you be able to Rx it?  Glendell Docker ----- Message ----- From: Rosalin Hawking, MD Sent: 06/02/2016   5:18 PM To: Gatha Mayer, MD, Thayer Headings, MD, # Subject: RE: anti-coag Tx - Fe defic and recent GI bl#  Thanks, Dr. Carlean Purl, for letting me know. I am OK with eliquis monotherapy.   Jindong  ----- Message ----- From: Gatha Mayer, MD Sent: 06/02/2016  10:50 AM To: Thayer Headings, MD, Rosalin Hawking, MD, # Subject: anti-coag Tx - Fe defic and recent GI bleed    Dear Colleagues:  Mr. Mcclaren had a recent GI bleed after 81 mg ASA added to Xarelto following cerebellar stroke. We found a suspected bleeding AVM in jejunum on capsule endoscopy of small bowel but I could not see it to treat it at push enteroscopy today.  You can see my recommendations in the report in procedures section.  I favor monoTx retry with Xa inhibitor - ? Use Eliquis over Xarelto per prior neuro and cardiology  notes.   Phil, ? Is he a candidate for the atrial device that reduces emboli?  He also needs to get back to [facility] for parenteral fe  Glendell Docker

## 2016-06-04 DIAGNOSIS — Z23 Encounter for immunization: Secondary | ICD-10-CM | POA: Diagnosis not present

## 2016-06-04 DIAGNOSIS — D62 Acute posthemorrhagic anemia: Secondary | ICD-10-CM | POA: Diagnosis not present

## 2016-06-06 ENCOUNTER — Telehealth: Payer: Self-pay | Admitting: Cardiovascular Disease

## 2016-06-06 MED ORDER — APIXABAN 5 MG PO TABS: 5.0000 mg | ORAL_TABLET | Freq: Two times a day (BID) | ORAL | 6 refills | Status: DC

## 2016-06-06 NOTE — Telephone Encounter (Signed)
Chart reviewed and previous phone note from Dr. Acie Fredrickson states pt will be prescribed Elquis 5 mg twice daily.  I spoke with pt's wife and she has spoken with Dr.Gesner's office and is aware Eliquis will be ordered.  I told her I would send prescription to [facility] in [place]. Pt has appt with Dr. Acie Fredrickson on December 4,2017 and wife is asking if Dr. Acie Fredrickson wants to see pt sooner.  I told her I would forward to Dr. Acie Fredrickson and we would call her if he wanted to see pt sooner than planned.

## 2016-06-06 NOTE — Telephone Encounter (Signed)
Pt's wife called about medication ELOQIST That he need called in to [facility] [place].

## 2016-06-08 NOTE — Telephone Encounter (Signed)
He seems to be stable. Will keep appt in Dec.    Will see him sooner if he has any issues.

## 2016-06-09 ENCOUNTER — Telehealth: Payer: Self-pay

## 2016-06-09 NOTE — Telephone Encounter (Signed)
Prior auth for Eliquis 5 mg approved through  [employer].

## 2016-06-11 ENCOUNTER — Encounter: Payer: Self-pay | Admitting: Internal Medicine

## 2016-06-16 ENCOUNTER — Encounter: Payer: Self-pay | Admitting: Internal Medicine

## 2016-06-16 ENCOUNTER — Ambulatory Visit (INDEPENDENT_AMBULATORY_CARE_PROVIDER_SITE_OTHER): Payer: Medicare Other | Admitting: Internal Medicine

## 2016-06-16 VITALS — BP 130/62 | HR 61 | Ht 75.0 in | Wt 246.2 lb

## 2016-06-16 DIAGNOSIS — I639 Cerebral infarction, unspecified: Secondary | ICD-10-CM

## 2016-06-16 DIAGNOSIS — I480 Paroxysmal atrial fibrillation: Secondary | ICD-10-CM

## 2016-06-16 DIAGNOSIS — I442 Atrioventricular block, complete: Secondary | ICD-10-CM | POA: Diagnosis not present

## 2016-06-16 NOTE — Progress Notes (Signed)
Electrophysiology Office Note   Date:  06/16/2016   ID:  Richard [name], DOB 1930-09-16, MRN [id number]  PCP:  Tivis Ringer, MD  Cardiologist:  Dr Dagmar Hait Primary Electrophysiologist:  Thompson Grayer, MD    Chief Complaint  Patient presents with  . Atrial Fibrillation     History of Present Illness: Richard [name] is a 82 y.o. male who presents today for electrophysiology evaluation.   He has recently been hospitalized with acute blood loss anemia of unclear cause (presumed GI).  His Xarelto has been switched to eliquis.  He is making slow recovery.  Today, he denies symptoms of palpitations, chest pain, shortness of breath, orthopnea, PND, lower extremity edema, claudication, dizziness, presyncope, syncope, bleeding, or neurologic sequela. The patient is tolerating medications without difficulties and is otherwise without complaint today.    Past Medical History:  Diagnosis Date  . Anxiety   . Arthritis   . Atrial fibrillation ([place])    a. Dx 03/2013, notes report atrial fibrillation/atrial flutter, placed on amiodarone. NSR in subsequent OV's.  . B12 deficiency anemia   . Chest pain    a. H/o CTA negative for PE 2012, normal cath 2005, normal nucs previously including 05/2012.  Marland Kitchen Complete heart block (HCC)    a. s/p Medtronic Adapta L model ADDRL 1 (serial number [id number]) pacemaker.  Marland Kitchen GERD (gastroesophageal reflux disease)   . Hiatal hernia   . Hyperlipidemia   . Hypertension   . Iron deficiency anemia   . LBBB (left bundle branch block)   . Microcytic anemia   . Nephrolithiasis   . On home oxygen therapy    a. 2L w/CPAP at night  . On home oxygen therapy    "w/his CPAP at night; don't know how much" (05/21/2016)  . Orthostatic hypotension   . OSA on CPAP   . Rocky Mountain spotted fever ~ 1945  . Sinus drainage   . Skin cancer of lip   . Stroke ([place])   . Type II diabetes mellitus ([place])    Past Surgical History:  Procedure Laterality Date  . CARDIAC  CATHETERIZATION     by Dr. Acie Fredrickson, January 24, 2004, that shows minimal coronary artery irregularities and normal left ventricular function  . CATARACT EXTRACTION W/ INTRAOCULAR LENS  IMPLANT, BILATERAL Bilateral   . COLONOSCOPY WITH PROPOFOL N/A 05/23/2016   Procedure: COLONOSCOPY WITH PROPOFOL;  Surgeon: Jerene Bears, MD;  Location: [facility];  Service: Gastroenterology;  Laterality: N/A;  . ENTEROSCOPY N/A 06/02/2016   Procedure: ENTEROSCOPY;  Surgeon: Gatha Mayer, MD;  Location: [facility];  Service: Endoscopy;  Laterality: N/A;  . ESOPHAGOGASTRODUODENOSCOPY N/A 05/22/2016   Procedure: ESOPHAGOGASTRODUODENOSCOPY (EGD);  Surgeon: Jerene Bears, MD;  Location: [facility] ENDOSCOPY;  Service: Endoscopy;  Laterality: N/A;  . ESOPHAGOGASTRODUODENOSCOPY (EGD) WITH PROPOFOL N/A 06/21/2015   Procedure: ESOPHAGOGASTRODUODENOSCOPY (EGD) WITH PROPOFOL;  Surgeon: Gatha Mayer, MD;  Location: [facility]OS[facility];  Service: Endoscopy;  Laterality: N/A;  . GIVENS CAPSULE STUDY N/A 05/23/2016   Procedure: GIVENS CAPSULE STUDY;  Surgeon: Jerene Bears, MD;  Location: [place];  Service: Gastroenterology;  Laterality: N/A;  . HOT HEMOSTASIS N/A 06/02/2016   Procedure: HOT HEMOSTASIS (ARGON PLASMA COAGULATION/BICAP);  Surgeon: Gatha Mayer, MD;  Location: [facility];  Service: Endoscopy;  Laterality: N/A;  . INGUINAL HERNIA REPAIR Right   . LUMBAR DISC SURGERY  ~ 1993  . PERMANENT PACEMAKER INSERTION N/A 06/03/2014   MDT Adapta L implanted by Dr Rayann Heman for syncope  and transient AV block  . SKIN CANCER EXCISION     "lower lip" (04/06/2013)  . TEMPORARY PACEMAKER INSERTION N/A 05/31/2014   Procedure: TEMPORARY WIRE;  Surgeon: Sinclair Grooms, MD;  Location: [facility] CATH LAB;  Service: Cardiovascular;  Laterality: N/A;  . VASECTOMY     Hx of      Current Outpatient Prescriptions  Medication Sig Dispense Refill  . acetaminophen (TYLENOL) 500 MG tablet Take 500 mg by mouth 2 (two) times daily.     Marland Kitchen amiodarone  (PACERONE) 200 MG tablet Take 200 mg by mouth 3 (three) times a week. On Monday, Wednesday & Friday    . amLODipine (NORVASC) 10 MG tablet Take 1 tablet (10 mg total) by mouth daily. 30 tablet 0  . apixaban (ELIQUIS) 5 MG TABS tablet Take 1 tablet (5 mg total) by mouth 2 (two) times daily. 60 tablet 6  . atorvastatin (LIPITOR) 40 MG tablet Take 1 tablet (40 mg total) by mouth daily at 6 PM. 30 tablet 0  . cyanocobalamin (,VITAMIN B-12,) 1000 MCG/ML injection Inject 1,000 mcg into the muscle every 30 (thirty) days.    . diazepam (VALIUM) 5 MG tablet Take 1 tablet by mouth at bedtime.    . fluticasone (FLONASE) 50 MCG/ACT nasal spray Place 1 spray into the nose daily as needed for allergies or rhinitis.     Marland Kitchen isosorbide mononitrate (IMDUR) 30 MG 24 hr tablet Take 1 tablet (30 mg total) by mouth daily. 30 tablet 1  . metoprolol (LOPRESSOR) 50 MG tablet TAKE 1 TABLET BY MOUTH TWICE A DAY 60 tablet 11  . pantoprazole (PROTONIX) 40 MG tablet Take 1 tablet (40 mg total) by mouth daily at 6 (six) AM. 30 tablet 1  . pioglitazone (ACTOS) 30 MG tablet Take 30 mg by mouth daily.      . sertraline (ZOLOFT) 100 MG tablet Take 1 tablet by mouth daily.     No current facility-administered medications for this visit.     Allergies:   Codeine and Citalopram   Social History:  The patient  reports that he has quit smoking. His smoking use included Cigarettes. He smoked 0.00 packs per day for 20.00 years. He has never used smokeless tobacco. He reports that he does not drink alcohol or use drugs.   Family History:  The patient's family hx includes HTN (mother)   ROS:  Please see the history of present illness.   All other systems are reviewed and negative.    PHYSICAL EXAM: VS:  BP 130/62   Pulse 61   Ht 6\' 3"  (1.905 m)   Wt 246 lb 3.2 oz (111.7 kg)   BMI 30.77 kg/m  , BMI Body mass index is 30.77 kg/m. GEN: elderly and frail, in no acute distress  HEENT: normal  Neck: no JVD, carotid bruits, or  masses Cardiac: RRR  Respiratory:  clear to auscultation bilaterally, normal work of breathing GI: soft, nontender, nondistended, + BS MS: no deformity or atrophy  Skin: warm and dry,  device pocket is well healed, diffuse ecchymosis on his arms Neuro:  Strength and sensation are intact Psych: euthymic mood, full affect  EKG:  EKG is ordered today. The ekg ordered today shows sinus rhythm with LBBB   Device interrogation is reviewed today in detail.  See PaceArt for details.   Recent Labs: 05/22/2016: ALT 15 05/24/2016: BUN 7; Creatinine, Ser 1.10; Potassium 3.7; Sodium 139 05/27/2016: Hemoglobin 9.3; Platelets 225.0    Lipid Panel  Component Value Date/Time   CHOL 136 05/06/2016 0531   TRIG 70 05/06/2016 0531   HDL 38 (L) 05/06/2016 0531   CHOLHDL 3.6 05/06/2016 0531   VLDL 14 05/06/2016 0531   LDLCALC 84 05/06/2016 0531     Wt Readings from Last 3 Encounters:  06/16/16 246 lb 3.2 oz (111.7 kg)  06/02/16 240 lb (108.9 kg)  05/29/16 240 lb (108.9 kg)      Other studies Reviewed: Additional studies/ records that were reviewed today include: recent hospital records   ASSESSMENT AND PLAN:  1.  afib I have reviewed his pacemaker interrogations and chart going back to 2014 extensively today. He had afib documented in the hospital in 2014 with RVR for which he was placed on amiodarone and anticoagulation.  On review of his device interrogations, it does not appear that he has had any further afib despite amiodarone being weaned back to 3 days per week. His chads2vasc score is at least 6 and he has had a prior stroke.  I have concerns about discontinuing long term anticoagulation given prior stroke.  He is also at risks for GI bleeding. I agree with Dr Christophe Louis that we should consider Watchman LAAO device. Procedural risks for the Watchman implant have been reviewed with the patient including a 1% risk of stroke, 2% risk of perforation, 0.1% risk of device embolization.   Given the patient's poor candidacy for long-term oral anticoagulation, ability to tolerate short term oral anticoagulation, I have recommended the watchman left atrial appendage closure system.  TEE will be scheduled to review LAA anatomy.  The patient understands that the ability to implant Watchman is dependent on results of the TEE.  If patient is candidate for Watchman based on TEE results, we will schedule the procedure at the next available time. No changes are made today. Will obtain CBC at time of upcoming TEE.  2. Recent GI bleeding As above  3. Syncope, transient AV block resolved s/p PPM Normal pacemaker function See Pace Art report No changes today  4. Hypertensive cardiovascular disease Stable No change required today  Today, I have spent 40  minutes with the patient discussing risks of bleeding and stroke with anticoagulation for afib.  More than 50% of the visit time today was spent on this issue.    Current medicines are reviewed at length with the patient today.   The patient does not have concerns regarding his medicines.  The following changes were made today:  none  Signed, Thompson Grayer, MD  06/16/2016 5:59 PM     [place] [address] [place] [address] [phone] (office) [phone] (fax)

## 2016-06-16 NOTE — Patient Instructions (Addendum)
Medication Instructions:  Your physician recommends that you continue on your current medications as directed. Please refer to the Current Medication list given to you today.   Labwork: Your physician recommends that you return for lab work---will need a CBC day of TEE at the hospital    Testing/Procedures: Your physician has requested that you have a TEE. During a TEE, sound waves are used to create images of your heart. It provides your doctor with information about the size and shape of your heart and how well your heart's chambers and valves are working. In this test, a transducer is attached to the end of a flexible tube that's guided down your throat and into your esophagus (the tube leading from you mouth to your stomach) to get a more detailed image of your heart. You are not awake for the procedure. Please see the instruction sheet given to you today. For further information please visit [email or web address]  Please arrive at [facility] at 10:00am Do not eat or drink after midnight the night prior to the procedure Okay to take your medications with a sip of water the morning of the procedure Must have someone to drive you home after procedure        Follow-Up:  Chanetta Marshall, NP will call you after the TEE has been reviewed    Remote monitoring is used to monitor your Pacemaker  from home. This monitoring reduces the number of office visits required to check your device to one time per year. It allows [place] to keep an eye on the functioning of your device to ensure it is working properly. You are scheduled for a device check from home on 09/15/16. You may send your transmission at any time that day. If you have a wireless device, the transmission will be sent automatically. After your physician reviews your transmission, you will receive a postcard with your next transmission date.   Your physician wants you to follow-up in: 12  months with [place] will receive a reminder letter in the mail two months in advance. If you don't receive a letter, please call our office to schedule the follow-up appointment.

## 2016-06-17 ENCOUNTER — Other Ambulatory Visit: Payer: Self-pay | Admitting: Nurse Practitioner

## 2016-06-17 LAB — CUP PACEART INCLINIC DEVICE CHECK
Date Time Interrogation Session: 20171024164333
Implantable Lead Implant Date: 20151010
Implantable Lead Implant Date: 20151010
Implantable Lead Location: 753860
MDC IDC LEAD LOCATION: 753859

## 2016-06-19 DIAGNOSIS — E538 Deficiency of other specified B group vitamins: Secondary | ICD-10-CM | POA: Diagnosis not present

## 2016-06-22 DIAGNOSIS — N183 Chronic kidney disease, stage 3 (moderate): Secondary | ICD-10-CM | POA: Diagnosis not present

## 2016-06-22 DIAGNOSIS — I638 Other cerebral infarction: Secondary | ICD-10-CM | POA: Diagnosis not present

## 2016-06-22 DIAGNOSIS — D62 Acute posthemorrhagic anemia: Secondary | ICD-10-CM | POA: Diagnosis not present

## 2016-06-22 DIAGNOSIS — K922 Gastrointestinal hemorrhage, unspecified: Secondary | ICD-10-CM | POA: Diagnosis not present

## 2016-06-22 DIAGNOSIS — I480 Paroxysmal atrial fibrillation: Secondary | ICD-10-CM | POA: Diagnosis not present

## 2016-06-26 ENCOUNTER — Telehealth: Payer: Self-pay | Admitting: Internal Medicine

## 2016-06-26 NOTE — Telephone Encounter (Signed)
Forward to Dr Rayann Heman who ordered.

## 2016-06-26 NOTE — Telephone Encounter (Signed)
FYI

## 2016-06-26 NOTE — Telephone Encounter (Signed)
I spoke with patient's wife (on [place]) Dorian Pod.  She states patient asked her to call and let us know he will not be having procedure (TEE on 11/8). She states he "does not feel like it right now".  She states patient fell last Friday and is very sore. She states patient was without his cane and lost balance, caused him to fall to ground.  She states he has not been seen by healthcare provider for fall.  She states he will call back when ready to have procedure. I advised her of procedure importance, she voiced understanding and states she has tried to talk patient into having the procedure and he refuses.  She states she would like me to call and cancel the procedure for her. I did so.  FYI.

## 2016-06-26 NOTE — Telephone Encounter (Signed)
New message   Pt wife verbalized that pt wants to cancel 11/8 @ 10 at [facility] he said he does not feel up to having it don't just yet

## 2016-07-02 ENCOUNTER — Encounter (HOSPITAL_COMMUNITY): Payer: Self-pay

## 2016-07-02 ENCOUNTER — Ambulatory Visit (HOSPITAL_COMMUNITY): Admit: 2016-07-02 | Payer: Medicare Other | Admitting: Cardiology

## 2016-07-02 SURGERY — ECHOCARDIOGRAM, TRANSESOPHAGEAL
Anesthesia: Moderate Sedation

## 2016-07-07 ENCOUNTER — Other Ambulatory Visit: Payer: Self-pay | Admitting: Cardiovascular Disease

## 2016-07-08 DIAGNOSIS — Z6828 Body mass index (BMI) 28.0-28.9, adult: Secondary | ICD-10-CM | POA: Diagnosis not present

## 2016-07-08 DIAGNOSIS — D62 Acute posthemorrhagic anemia: Secondary | ICD-10-CM | POA: Diagnosis not present

## 2016-07-08 DIAGNOSIS — I480 Paroxysmal atrial fibrillation: Secondary | ICD-10-CM | POA: Diagnosis not present

## 2016-07-08 DIAGNOSIS — N183 Chronic kidney disease, stage 3 (moderate): Secondary | ICD-10-CM | POA: Diagnosis not present

## 2016-07-08 DIAGNOSIS — E1122 Type 2 diabetes mellitus with diabetic chronic kidney disease: Secondary | ICD-10-CM | POA: Diagnosis not present

## 2016-07-08 DIAGNOSIS — K922 Gastrointestinal hemorrhage, unspecified: Secondary | ICD-10-CM | POA: Diagnosis not present

## 2016-07-08 DIAGNOSIS — I638 Other cerebral infarction: Secondary | ICD-10-CM | POA: Diagnosis not present

## 2016-07-11 ENCOUNTER — Other Ambulatory Visit: Payer: Self-pay | Admitting: Nurse Practitioner

## 2016-07-11 MED ORDER — ISOSORBIDE MONONITRATE ER 30 MG PO TB24: 30.0000 mg | ORAL_TABLET | Freq: Every day | ORAL | 0 refills | Status: DC

## 2016-07-22 ENCOUNTER — Encounter: Payer: Self-pay | Admitting: Cardiovascular Disease

## 2016-07-28 ENCOUNTER — Ambulatory Visit: Payer: Medicare Other | Admitting: Cardiovascular Disease

## 2016-07-29 ENCOUNTER — Ambulatory Visit (INDEPENDENT_AMBULATORY_CARE_PROVIDER_SITE_OTHER): Payer: Medicare Other | Admitting: Cardiovascular Disease

## 2016-07-29 ENCOUNTER — Encounter: Payer: Self-pay | Admitting: Cardiovascular Disease

## 2016-07-29 VITALS — BP 102/52 | HR 60 | Ht 75.0 in | Wt 250.8 lb

## 2016-07-29 DIAGNOSIS — I480 Paroxysmal atrial fibrillation: Secondary | ICD-10-CM

## 2016-07-29 DIAGNOSIS — I10 Essential (primary) hypertension: Secondary | ICD-10-CM | POA: Diagnosis not present

## 2016-07-29 DIAGNOSIS — I639 Cerebral infarction, unspecified: Secondary | ICD-10-CM

## 2016-07-29 NOTE — Patient Instructions (Addendum)
Medication Instructions:   Your physician has recommended you make the following change in your medication:   1) STOP Imdur  --- If you need a refill on your cardiac medications before your next appointment, please call your pharmacy. ---  Labwork:  None ordered  Testing/Procedures:  None ordered  Follow-Up:  Your physician wants you to follow-up in: 6 months with Dr. Acie Fredrickson.  You will receive a reminder letter in the mail two months in advance. If you don't receive a letter, please call our office to schedule the follow-up appointment.   Any Other Special Instructions Will Be Listed Below (If Applicable).  Please keep track of your blood pressure.  If it begins to increase (top number above 140)  please call the office to discuss.   Thank you for choosing [facility]!!

## 2016-07-29 NOTE — Progress Notes (Signed)
Cardiology Office Note   Date:  07/29/2016   ID:  Richard [name], DOB 1930-10-22, MRN [id number]  PCP:  Tivis Ringer, MD  Cardiologist:   Mertie Moores, MD   Chief Complaint  Patient presents with  . Follow-up    HTN, atrial fib,     Problem List 1. Atrial fib 2. Essential Hypertension 3. Microcytic anemia  4. Pacer 5, diabetes Mellitus    Jan 17, 2015:  SHEIKH [name] is a 82 y.o. male who presents for  His atrial fib Having lots of problems with anemia.  No blood in stool Being set up to see a hematologist  Still eating salt frequently  Nov. 22, 2016:    Doing well from a cardiac standpoint. Having back pain . , had an injection in his back . Having problems with anemia   February 01, 2016:  Doing great from a cardiac standpoint Is falling frequently .   Is very weak Has been seen by EP - thought not to be related to bradycardia or hypotension .  Possibly dehydration Legs are wobbly  Discussed orthostasis - his symptoms do not sound like orthstasis   Dec. 5, 2017:  Doing well. No CP or dyspnea Still falling on occasion. Fell Thanksgiving night.   [place] off his riding lawnmower yesterday.  Is a poor candidate for chronic anticoagulation.  Is on Eliquis currently.   Was seen by Dr. Rayann Heman, was set up to begin eval for a Watchman.   He was scheduled to have a TEE  But was not able to go because of generalized weakness.   Past Medical History:  Diagnosis Date  . Anxiety   . Arthritis   . Atrial fibrillation ([place])    a. Dx 03/2013, notes report atrial fibrillation/atrial flutter, placed on amiodarone. NSR in subsequent OV's.  . B12 deficiency anemia   . Chest pain    a. H/o CTA negative for PE 2012, normal cath 2005, normal nucs previously including 05/2012.  Marland Kitchen Complete heart block (HCC)    a. s/p Medtronic Adapta L model ADDRL 1 (serial number [id number]) pacemaker.  Marland Kitchen GERD (gastroesophageal reflux disease)   . Hiatal hernia   . Hyperlipidemia   .  Hypertension   . Iron deficiency anemia   . LBBB (left bundle branch block)   . Microcytic anemia   . Nephrolithiasis   . On home oxygen therapy    a. 2L w/CPAP at night  . On home oxygen therapy    "w/his CPAP at night; don't know how much" (05/21/2016)  . Orthostatic hypotension   . OSA on CPAP   . Rocky Mountain spotted fever ~ 1945  . Sinus drainage   . Skin cancer of lip   . Stroke ([place])   . Type II diabetes mellitus ([place])     Past Surgical History:  Procedure Laterality Date  . CARDIAC CATHETERIZATION     by Dr. Acie Fredrickson, January 24, 2004, that shows minimal coronary artery irregularities and normal left ventricular function  . CATARACT EXTRACTION W/ INTRAOCULAR LENS  IMPLANT, BILATERAL Bilateral   . COLONOSCOPY WITH PROPOFOL N/A 05/23/2016   Procedure: COLONOSCOPY WITH PROPOFOL;  Surgeon: Jerene Bears, MD;  Location: [facility];  Service: Gastroenterology;  Laterality: N/A;  . ENTEROSCOPY N/A 06/02/2016   Procedure: ENTEROSCOPY;  Surgeon: Gatha Mayer, MD;  [facility];  Service: Endoscopy;  Laterality: N/A;  . ESOPHAGOGASTRODUODENOSCOPY N/A 05/22/2016   Procedure: ESOPHAGOGASTRODUODENOSCOPY (EGD);  Surgeon: Jerene Bears,  MD;  Location: [facility] ENDOSCOPY;  Service: Endoscopy;  Laterality: N/A;  . ESOPHAGOGASTRODUODENOSCOPY (EGD) WITH PROPOFOL N/A 06/21/2015   Procedure: ESOPHAGOGASTRODUODENOSCOPY (EGD) WITH PROPOFOL;  Surgeon: Gatha Mayer, MD;  [facility];  Service: Endoscopy;  Laterality: N/A;  . GIVENS CAPSULE STUDY N/A 05/23/2016   Procedure: GIVENS CAPSULE STUDY;  Surgeon: Jerene Bears, MD;  Location: [place];  Service: Gastroenterology;  Laterality: N/A;  . HOT HEMOSTASIS N/A 06/02/2016   Procedure: HOT HEMOSTASIS (ARGON PLASMA COAGULATION/BICAP);  Surgeon: Gatha Mayer, MD;  Location: [facility];  Service: Endoscopy;  Laterality: N/A;  . INGUINAL HERNIA REPAIR Right   . LUMBAR DISC SURGERY  ~ 1993  . PERMANENT PACEMAKER INSERTION N/A 06/03/2014     MDT Adapta L implanted by Dr Rayann Heman for syncope and transient AV block  . SKIN CANCER EXCISION     "lower lip" (04/06/2013)  . TEMPORARY PACEMAKER INSERTION N/A 05/31/2014   Procedure: TEMPORARY WIRE;  Surgeon: Sinclair Grooms, MD;  Location: [facility] CATH LAB;  Service: Cardiovascular;  Laterality: N/A;  . VASECTOMY     Hx of      Current Outpatient Prescriptions  Medication Sig Dispense Refill  . acetaminophen (TYLENOL) 500 MG tablet Take 500 mg by mouth 2 (two) times daily.     Marland Kitchen amiodarone (PACERONE) 200 MG tablet Take 200 mg by mouth 3 (three) times a week. On Monday, Wednesday & Friday    . amLODipine (NORVASC) 10 MG tablet Take 1 tablet (10 mg total) by mouth daily. 30 tablet 0  . apixaban (ELIQUIS) 5 MG TABS tablet Take 1 tablet (5 mg total) by mouth 2 (two) times daily. 60 tablet 6  . atorvastatin (LIPITOR) 40 MG tablet Take 1 tablet (40 mg total) by mouth daily at 6 PM. 30 tablet 0  . cyanocobalamin (,VITAMIN B-12,) 1000 MCG/ML injection Inject 1,000 mcg into the muscle every 30 (thirty) days.    . diazepam (VALIUM) 5 MG tablet Take 1 tablet by mouth at bedtime.    . fluticasone (FLONASE) 50 MCG/ACT nasal spray Place 1 spray into the nose daily as needed for allergies or rhinitis.     Marland Kitchen isosorbide mononitrate (IMDUR) 30 MG 24 hr tablet Take 1 tablet (30 mg total) by mouth daily. 30 tablet 0  . metoprolol (LOPRESSOR) 50 MG tablet TAKE 1 TABLET BY MOUTH TWICE A DAY 60 tablet 11  . pantoprazole (PROTONIX) 40 MG tablet Take 1 tablet (40 mg total) by mouth daily at 6 (six) AM. 30 tablet 1  . pioglitazone (ACTOS) 30 MG tablet Take 30 mg by mouth daily.      . sertraline (ZOLOFT) 100 MG tablet Take 1 tablet by mouth daily.     No current facility-administered medications for this visit.     Allergies:   Codeine and Citalopram    Social History:  The patient  reports that he has quit smoking. His smoking use included Cigarettes. He smoked 0.00 packs per day for 20.00 years. He has  never used smokeless tobacco. He reports that he does not drink alcohol or use drugs.   Family History:  The patient's family history includes Breast cancer in his sister; Pneumonia in his father; Stroke in his brother; Thyroid disease in his mother.    ROS:  Please see the history of present illness.    Review of Systems: Constitutional:  denies fever, chills, diaphoresis, appetite change and fatigue.  HEENT: denies photophobia, eye pain, redness, hearing loss, ear pain, congestion,  sore throat, rhinorrhea, sneezing, neck pain, neck stiffness and tinnitus.  Respiratory: denies SOB, DOE, cough, chest tightness, and wheezing.  Cardiovascular: denies chest pain, palpitations and leg swelling.  Gastrointestinal: denies nausea, vomiting, abdominal pain, diarrhea, constipation, blood in stool.  Genitourinary: denies dysuria, urgency, frequency, hematuria, flank pain and difficulty urinating.  Musculoskeletal: denies  myalgias, back pain, joint swelling, arthralgias and gait problem.   Skin: denies pallor, rash and wound.  Neurological: denies dizziness, seizures, syncope, weakness, light-headedness, numbness and headaches.   Hematological: denies adenopathy, easy bruising, personal or family bleeding history.  Psychiatric/ Behavioral: denies suicidal ideation, mood changes, confusion, nervousness, sleep disturbance and agitation.       All other systems are reviewed and negative.    PHYSICAL EXAM: VS:  BP (!) 102/52 (BP Location: Right Arm, Patient Position: Sitting, Cuff Size: Normal)   Pulse 60   Ht 6\' 3"  (1.905 m)   Wt 250 lb 12.8 oz (113.8 kg)   BMI 31.35 kg/m  , BMI Body mass index is 31.35 kg/m. GEN: Well nourished, well developed, in no acute distress  HEENT: normal  Neck: no JVD, carotid bruits, or masses Cardiac: RRR; no murmurs, rubs, or gallops,no edema  Respiratory:  clear to auscultation bilaterally, normal work of breathing GI: soft, nontender, nondistended, + BS MS:  no deformity or atrophy  Skin: warm and dry, no rash Neuro:  Strength and sensation are intact Psych: normal   EKG:  EKG is not ordered today.   Recent Labs: 05/22/2016: ALT 15 05/24/2016: BUN 7; Creatinine, Ser 1.10; Potassium 3.7; Sodium 139 05/27/2016: Hemoglobin 9.3; Platelets 225.0    Lipid Panel    Component Value Date/Time   CHOL 136 05/06/2016 0531   TRIG 70 05/06/2016 0531   HDL 38 (L) 05/06/2016 0531   CHOLHDL 3.6 05/06/2016 0531   VLDL 14 05/06/2016 0531   LDLCALC 84 05/06/2016 0531      Wt Readings from Last 3 Encounters:  07/29/16 250 lb 12.8 oz (113.8 kg)  06/16/16 246 lb 3.2 oz (111.7 kg)  06/02/16 240 lb (108.9 kg)      Other studies Reviewed: Additional studies/ records that were reviewed today include: . Review of the above records demonstrates:    ASSESSMENT AND PLAN:   1.   Frequent falls.  Mr. Orahood is having frequent falling. He states that his legs are very weak and he loses his balance very easily.    He's a fairly poor candidate for long-term and quite relation. We have him on Eliquis currently because he's in the process of deciding whether or not he wants to have a watchman procedure.  He'll call us back if he decides to have a watchman placed. He now walks with the assistance of a walker. His symptoms do not sound orthostatic but his blood pressure is low today. We will discontinue the isosorbide..  His knees are very weak. He will address these issues with his primary medical doctor.  2. Atrial fib- his rate is well-controlled. He now has a pacemaker. He is currently on Eliquis.   Was scheduled for a transesophageal echo as the initial step toward getting a watchman procedure but the patient was too weak to go to the hospital for his TEE.  He'll call us back when he feels better and will like to start that process again.  3. Essential Hypertension - blood pressure is a little on the low side. We'll discontinue the isosorbide.  4.  Pacer - followed by Dr. Rayann Heman  5, diabetes Mellitus  - followed by his medical doctor    Current medicines are reviewed at length with the patient today.  The patient does not have concerns regarding medicines.  The following changes have been made:  no change  Labs/ tests ordered today include:  No orders of the defined types were placed in this encounter.    Disposition:   FU with me in 6 months      Mertie Moores, MD  07/29/2016 9:43 AM    [facility] [place], [place], [place]  [address] Phone: [phone]; Fax: [phone]

## 2016-07-30 DIAGNOSIS — E538 Deficiency of other specified B group vitamins: Secondary | ICD-10-CM | POA: Diagnosis not present

## 2016-08-08 ENCOUNTER — Ambulatory Visit (HOSPITAL_COMMUNITY): Payer: Medicare Other | Admitting: Hematology & Oncology

## 2016-08-08 ENCOUNTER — Other Ambulatory Visit (HOSPITAL_COMMUNITY): Payer: Medicare Other

## 2016-09-03 ENCOUNTER — Ambulatory Visit: Payer: Medicare Other | Admitting: Neurology

## 2016-09-04 DIAGNOSIS — Z6829 Body mass index (BMI) 29.0-29.9, adult: Secondary | ICD-10-CM | POA: Diagnosis not present

## 2016-09-04 DIAGNOSIS — D62 Acute posthemorrhagic anemia: Secondary | ICD-10-CM | POA: Diagnosis not present

## 2016-09-04 DIAGNOSIS — N183 Chronic kidney disease, stage 3 (moderate): Secondary | ICD-10-CM | POA: Diagnosis not present

## 2016-09-04 DIAGNOSIS — E538 Deficiency of other specified B group vitamins: Secondary | ICD-10-CM | POA: Diagnosis not present

## 2016-09-04 DIAGNOSIS — K922 Gastrointestinal hemorrhage, unspecified: Secondary | ICD-10-CM | POA: Diagnosis not present

## 2016-09-04 DIAGNOSIS — M5380 Other specified dorsopathies, site unspecified: Secondary | ICD-10-CM | POA: Diagnosis not present

## 2016-09-04 DIAGNOSIS — R296 Repeated falls: Secondary | ICD-10-CM | POA: Diagnosis not present

## 2016-09-04 DIAGNOSIS — I10 Essential (primary) hypertension: Secondary | ICD-10-CM | POA: Diagnosis not present

## 2016-09-04 DIAGNOSIS — I638 Other cerebral infarction: Secondary | ICD-10-CM | POA: Diagnosis not present

## 2016-09-04 DIAGNOSIS — I480 Paroxysmal atrial fibrillation: Secondary | ICD-10-CM | POA: Diagnosis not present

## 2016-09-06 ENCOUNTER — Emergency Department (HOSPITAL_COMMUNITY)
Admission: EM | Admit: 2016-09-06 | Discharge: 2016-09-06 | Disposition: A | Discharge status: Home / Self Care | Payer: Medicare Other | Attending: Emergency Medicine | Admitting: Emergency Medicine

## 2016-09-06 ENCOUNTER — Encounter (HOSPITAL_COMMUNITY): Payer: Self-pay | Admitting: Emergency Medicine

## 2016-09-06 DIAGNOSIS — Z79899 Other long term (current) drug therapy: Secondary | ICD-10-CM | POA: Diagnosis not present

## 2016-09-06 DIAGNOSIS — Y939 Activity, unspecified: Secondary | ICD-10-CM | POA: Diagnosis not present

## 2016-09-06 DIAGNOSIS — Z85820 Personal history of malignant melanoma of skin: Secondary | ICD-10-CM | POA: Insufficient documentation

## 2016-09-06 DIAGNOSIS — Y999 Unspecified external cause status: Secondary | ICD-10-CM | POA: Insufficient documentation

## 2016-09-06 DIAGNOSIS — S51811A Laceration without foreign body of right forearm, initial encounter: Secondary | ICD-10-CM | POA: Insufficient documentation

## 2016-09-06 DIAGNOSIS — W19XXXA Unspecified fall, initial encounter: Secondary | ICD-10-CM | POA: Insufficient documentation

## 2016-09-06 DIAGNOSIS — I129 Hypertensive chronic kidney disease with stage 1 through stage 4 chronic kidney disease, or unspecified chronic kidney disease: Secondary | ICD-10-CM | POA: Diagnosis not present

## 2016-09-06 DIAGNOSIS — Z87891 Personal history of nicotine dependence: Secondary | ICD-10-CM | POA: Insufficient documentation

## 2016-09-06 DIAGNOSIS — E1122 Type 2 diabetes mellitus with diabetic chronic kidney disease: Secondary | ICD-10-CM | POA: Insufficient documentation

## 2016-09-06 DIAGNOSIS — Y929 Unspecified place or not applicable: Secondary | ICD-10-CM | POA: Insufficient documentation

## 2016-09-06 DIAGNOSIS — S59911A Unspecified injury of right forearm, initial encounter: Secondary | ICD-10-CM | POA: Diagnosis present

## 2016-09-06 DIAGNOSIS — N189 Chronic kidney disease, unspecified: Secondary | ICD-10-CM | POA: Diagnosis not present

## 2016-09-06 MED ORDER — CEPHALEXIN 500 MG PO CAPS: 500.0000 mg | ORAL_CAPSULE | Freq: Four times a day (QID) | ORAL | 0 refills | Status: DC

## 2016-09-06 NOTE — ED Notes (Signed)
Patient states that he is not having any pain at this time, just sore.

## 2016-09-06 NOTE — ED Provider Notes (Signed)
[place] DEPT Provider Note   CSN: [id number] Arrival date & time: 09/06/16  1552     History   Chief Complaint Chief Complaint  Patient presents with  . Fall  . Extremity Laceration    HPI Richard [name] is a 83 y.o. male.  HPI  Had mechanical fall 5 days ago - he had been undressing and fell to the floor between the bed and furniture - scraped the arm - the wound keeps bleeding and they came for that reason.  No fevers, no other sx - no other injuries.  Wife is concerned for infection b/c of ongoing bleeding.  On eliquis for afib.  Past Medical History:  Diagnosis Date  . Anxiety   . Arthritis   . Atrial fibrillation ([place])    a. Dx 03/2013, notes report atrial fibrillation/atrial flutter, placed on amiodarone. NSR in subsequent OV's.  . B12 deficiency anemia   . Chest pain    a. H/o CTA negative for PE 2012, normal cath 2005, normal nucs previously including 05/2012.  Marland Kitchen Complete heart block (HCC)    a. s/p Medtronic Adapta L model ADDRL 1 (serial number [id number]) pacemaker.  Marland Kitchen GERD (gastroesophageal reflux disease)   . Hiatal hernia   . Hyperlipidemia   . Hypertension   . Iron deficiency anemia   . LBBB (left bundle branch block)   . Microcytic anemia   . Nephrolithiasis   . On home oxygen therapy    a. 2L w/CPAP at night  . On home oxygen therapy    "w/his CPAP at night; don't know how much" (05/21/2016)  . Orthostatic hypotension   . OSA on CPAP   . Rocky Mountain spotted fever ~ 1945  . Sinus drainage   . Skin cancer of lip   . Stroke ([place])   . Type II diabetes mellitus [facility])     Patient Active Problem List   Diagnosis Date Noted  . AVM (arteriovenous malformation) of small bowel, acquired ([place])   . Abnormality of gait as late effect of stroke 05/27/2016  . Benign neoplasm of cecum   . Benign neoplasm of sigmoid colon   . GI bleeding 05/22/2016  . Melena   . GIB (gastrointestinal bleeding) 05/21/2016  . Diabetes mellitus with complication  ([place])   . Upper GI bleed   . CKD (chronic kidney disease)   . Type 2 diabetes mellitus with circulatory disorder, without long-term current use of insulin ([place])   . Acute right PCA stroke ([place]) 05/08/2016  . Ataxia due to recent stroke   . Gait disturbance, post-stroke   . Cerebellar stroke, acute ([place]) 05/07/2016  . Hyperlipidemia   . Chronic anticoagulation   . Cardiac pacemaker in situ   . Acute blood loss anemia   . Cognitive deficits   . Cognitive deficit due to recent stroke   . Stroke-related cognitive dysfunction ([place])   . Generalized weakness 05/05/2016  . Dizziness   . Absolute anemia 02/08/2016  . Weakness 02/01/2016  . Gastric polyp 06/21/2015  . Iron deficiency anemia due to chronic blood loss   . GERD (gastroesophageal reflux disease)   . B12 deficiency anemia   . Bradycardia with less than 30 beats per minute 05/31/2014  . Complete heart block ([place]) 05/31/2014  . Syncope 05/31/2014  . Anemia 05/31/2014  . Atrial fibrillation ([place]) 04/08/2013  . Diabetes mellitus ([place]) 04/06/2013  . Hiatal hernia 04/06/2013  . Essential hypertension 06/13/2011  . OSA (obstructive sleep apnea) 06/13/2011  Past Surgical History:  Procedure Laterality Date  . CARDIAC CATHETERIZATION     by Dr. Acie Fredrickson, January 24, 2004, that shows minimal coronary artery irregularities and normal left ventricular function  . CATARACT EXTRACTION W/ INTRAOCULAR LENS  IMPLANT, BILATERAL Bilateral   . COLONOSCOPY WITH PROPOFOL N/A 05/23/2016   Procedure: COLONOSCOPY WITH PROPOFOL;  Surgeon: Jerene Bears, MD;  Location: [place];  Service: Gastroenterology;  Laterality: N/A;  . ENTEROSCOPY N/A 06/02/2016   Procedure: ENTEROSCOPY;  Surgeon: Gatha Mayer, MD;  Location: [facility] ENDOSCOPY;  Service: Endoscopy;  Laterality: N/A;  . ESOPHAGOGASTRODUODENOSCOPY N/A 05/22/2016   Procedure: ESOPHAGOGASTRODUODENOSCOPY (EGD);  Surgeon: Jerene Bears, MD;  Location: [facility] ENDOSCOPY;  Service: Endoscopy;  Laterality: N/A;   . ESOPHAGOGASTRODUODENOSCOPY (EGD) WITH PROPOFOL N/A 06/21/2015   Procedure: ESOPHAGOGASTRODUODENOSCOPY (EGD) WITH PROPOFOL;  Surgeon: Gatha Mayer, MD;  Location: [facility] ENDOSCOPY;  Service: Endoscopy;  Laterality: N/A;  . GIVENS CAPSULE STUDY N/A 05/23/2016   Procedure: GIVENS CAPSULE STUDY;  Surgeon: Jerene Bears, MD;  Location: [place];  Service: Gastroenterology;  Laterality: N/A;  . HOT HEMOSTASIS N/A 06/02/2016   Procedure: HOT HEMOSTASIS (ARGON PLASMA COAGULATION/BICAP);  Surgeon: Gatha Mayer, MD;  Location: [facility];  Service: Endoscopy;  Laterality: N/A;  . INGUINAL HERNIA REPAIR Right   . LUMBAR DISC SURGERY  ~ 1993  . PERMANENT PACEMAKER INSERTION N/A 06/03/2014   MDT Adapta L implanted by Dr Rayann Heman for syncope and transient AV block  . SKIN CANCER EXCISION     "lower lip" (04/06/2013)  . TEMPORARY PACEMAKER INSERTION N/A 05/31/2014   Procedure: TEMPORARY WIRE;  Surgeon: Sinclair Grooms, MD;  Location: [facility] CATH LAB;  Service: Cardiovascular;  Laterality: N/A;  . VASECTOMY     Hx of     OB History    No data available       Home Medications    Prior to Admission medications   Medication Sig Start Date End Date Taking? Authorizing Provider  acetaminophen (TYLENOL) 500 MG tablet Take 500 mg by mouth 2 (two) times daily.    Yes Historical Provider, MD  amiodarone (PACERONE) 200 MG tablet Take 200 mg by mouth 3 (three) times a week. On Monday, Wednesday & Friday   Yes Historical Provider, MD  amLODipine (NORVASC) 10 MG tablet Take 1 tablet (10 mg total) by mouth daily. 05/16/16  Yes Ivan Anchors Love, PA-C  apixaban (ELIQUIS) 5 MG TABS tablet Take 1 tablet (5 mg total) by mouth 2 (two) times daily. 06/06/16  Yes Thayer Headings, MD  atorvastatin (LIPITOR) 40 MG tablet Take 1 tablet (40 mg total) by mouth daily at 6 PM. 05/16/16  Yes Ivan Anchors Love, PA-C  diazepam (VALIUM) 5 MG tablet Take 1 tablet by mouth at bedtime. 06/09/16  Yes Historical Provider, MD  fluticasone  (FLONASE) 50 MCG/ACT nasal spray Place 1 spray into the nose daily as needed for allergies or rhinitis.    Yes Historical Provider, MD  metoprolol (LOPRESSOR) 50 MG tablet TAKE 1 TABLET BY MOUTH TWICE A DAY 07/07/16  Yes Thayer Headings, MD  pantoprazole (PROTONIX) 40 MG tablet Take 1 tablet (40 mg total) by mouth daily at 6 (six) AM. 05/25/16  Yes Kelvin Cellar, MD  pioglitazone (ACTOS) 30 MG tablet Take 30 mg by mouth daily.     Yes Historical Provider, MD  sertraline (ZOLOFT) 100 MG tablet Take 1 tablet by mouth daily. 08/21/14  Yes Historical Provider, MD  cephALEXin (KEFLEX) 500 MG capsule  Take 1 capsule (500 mg total) by mouth 4 (four) times daily. 09/06/16   Noemi Chapel, MD  cyanocobalamin (,VITAMIN B-12,) 1000 MCG/ML injection Inject 1,000 mcg into the muscle every 30 (thirty) days.    Historical Provider, MD    Family History Family History  Problem Relation Age of Onset  . Thyroid disease Mother     goiter  . Pneumonia Father   . Other      Parents both died of old age; other conditions not known  . Stroke Brother   . Breast cancer Sister     Social History Social History  Substance Use Topics  . Smoking status: Former Smoker    Packs/day: 0.00    Years: 20.00    Types: Cigarettes  . Smokeless tobacco: Never Used     Comment: 04/06/2013 "quit smoking years ago; didn't smoke that much; probably 20 years; 1ppd"  . Alcohol use No     Allergies   Codeine and Citalopram   Review of Systems Review of Systems  All other systems reviewed and are negative.    Physical Exam Updated Vital Signs BP 172/72 (BP Location: Left Arm)   Pulse 68   Temp 97.4 F (36.3 C) (Oral)   Resp 20   Ht 6\' 3"  (1.905 m)   Wt 256 lb (116.1 kg)   SpO2 96%   BMI 32.00 kg/m   Physical Exam  Constitutional: He appears well-developed and well-nourished.  HENT:  Head: Normocephalic and atraumatic.  Eyes: Conjunctivae are normal. Right eye exhibits no discharge. Left eye exhibits no  discharge.  Cardiovascular: Normal rate.   Pulmonary/Chest: Effort normal. No respiratory distress.  Musculoskeletal: He exhibits no edema or deformity.  Normal flexor and extensor tendons and normal pulses at the wrist  Neurological: He is alert. Coordination normal.  Skin: Skin is warm and dry. No rash noted. He is not diaphoretic. No erythema.  Extensor surface of mid right forearm - 4 inches in length, irregular edges awith some mild bleedign / oozing.  Smaller skin tear over the right elbow, small middle purulent material, skin flap back and wound cleaned well, clean base. Another skin tear on the volar forearm  Psychiatric: He has a normal mood and affect.  Nursing note and vitals reviewed.    ED Treatments / Results  Labs (all labs ordered are listed, but only abnormal results are displayed) Labs Reviewed - No data to display   Radiology No results found.  Procedures Procedures (including critical care time)  Medications Ordered in ED Medications - No data to display   Initial Impression / Assessment and Plan / ED Course  I have reviewed the triage vital signs and the nursing notes.  Pertinent labs & imaging results that were available during my care of the patient were reviewed by me and considered in my medical decision making (see chart for details).  Clinical Course     Overall the patient is well-appearing, he does have some areas of skin tear, this is all senile purpura, there is no signs of lacerations in need for by mouth. He has a gentle oozing from the inferior aspect of the right forearm wound on the extensor surface. There is no other bleeding. I have debrided the elbow wound under blunt instrumentation, removed some necrotic tissue and irrigated this. All the was irrigated, we will clears quickly, so he will need to be seen in the outpatient setting for recheck or to the emergency department if he cannot be seen  by his physician. He agrees.  Quick clot  placed on wound after the wounds were cleaned and debrided - the pt is stable for d/c to f/u in 2 days as outpt.  Pt and family in agreement.  Final Clinical Impressions(s) / ED Diagnoses   Final diagnoses:  Skin tear of forearm without complication, right, initial encounter  Fall, initial encounter    New Prescriptions New Prescriptions   CEPHALEXIN (KEFLEX) 500 MG CAPSULE    Take 1 capsule (500 mg total) by mouth 4 (four) times daily.     Noemi Chapel, MD 09/06/16 2121

## 2016-09-06 NOTE — ED Notes (Signed)
EDP changing dressing and applying Quick Clot.

## 2016-09-06 NOTE — Discharge Instructions (Signed)
Return to the ER if you continue to have bleeding that is heavy, if you have increased pain or fevers or spreading redness.  You should change the dressings in 2 days - I would prefer that you do this in the doctors office or the ER - do not get the dressings wet.

## 2016-09-06 NOTE — ED Notes (Signed)
Physician at bedside.

## 2016-09-06 NOTE — ED Triage Notes (Signed)
Pt reports falling 2 days ago and tearing right forearm on carpet.  Had had issues with it continuing to bleed.  Is on anticoagulants due to stroke.

## 2016-09-08 ENCOUNTER — Emergency Department (HOSPITAL_COMMUNITY)
Admission: EM | Admit: 2016-09-08 | Discharge: 2016-09-08 | Disposition: A | Discharge status: Home / Self Care | Payer: Medicare Other | Attending: Emergency Medicine | Admitting: Emergency Medicine

## 2016-09-08 ENCOUNTER — Encounter (HOSPITAL_COMMUNITY): Payer: Self-pay | Admitting: Emergency Medicine

## 2016-09-08 DIAGNOSIS — E1122 Type 2 diabetes mellitus with diabetic chronic kidney disease: Secondary | ICD-10-CM | POA: Insufficient documentation

## 2016-09-08 DIAGNOSIS — Z4800 Encounter for change or removal of nonsurgical wound dressing: Secondary | ICD-10-CM | POA: Insufficient documentation

## 2016-09-08 DIAGNOSIS — Z79899 Other long term (current) drug therapy: Secondary | ICD-10-CM | POA: Diagnosis not present

## 2016-09-08 DIAGNOSIS — N189 Chronic kidney disease, unspecified: Secondary | ICD-10-CM | POA: Diagnosis not present

## 2016-09-08 DIAGNOSIS — Z85828 Personal history of other malignant neoplasm of skin: Secondary | ICD-10-CM | POA: Diagnosis not present

## 2016-09-08 DIAGNOSIS — Z87891 Personal history of nicotine dependence: Secondary | ICD-10-CM | POA: Insufficient documentation

## 2016-09-08 DIAGNOSIS — Z5189 Encounter for other specified aftercare: Secondary | ICD-10-CM

## 2016-09-08 DIAGNOSIS — Z4802 Encounter for removal of sutures: Secondary | ICD-10-CM | POA: Diagnosis not present

## 2016-09-08 DIAGNOSIS — I129 Hypertensive chronic kidney disease with stage 1 through stage 4 chronic kidney disease, or unspecified chronic kidney disease: Secondary | ICD-10-CM | POA: Diagnosis not present

## 2016-09-08 MED ORDER — BACITRACIN ZINC 500 UNIT/GM EX OINT
TOPICAL_OINTMENT | CUTANEOUS | Status: AC
  Filled 2016-09-08: qty 2.7

## 2016-09-08 MED ORDER — BACITRACIN ZINC 500 UNIT/GM EX OINT
TOPICAL_OINTMENT | CUTANEOUS | Status: AC
  Filled 2016-09-08: qty 0.9

## 2016-09-08 MED ORDER — BACITRACIN ZINC 500 UNIT/GM EX OINT
TOPICAL_OINTMENT | CUTANEOUS | Status: AC
  Filled 2016-09-08: qty 4.5

## 2016-09-08 NOTE — ED Notes (Signed)
This RN placed bacitracin on the dressing to loosen for removal.  Will redress wound with petrolatum gauze and kerlix.  Cleaning wound with sterile water and saline.

## 2016-09-08 NOTE — Discharge Instructions (Signed)
Have the dressing changed in 3 days.

## 2016-09-08 NOTE — ED Provider Notes (Signed)
[place] DEPT Provider Note   CSN: [id number] Arrival date & time: 09/08/16  1027   By signing my name below, I, Evelene Croon, attest that this documentation has been prepared under the direction and in the presence of Davonna Belling, MD . Electronically Signed: Evelene Croon, Scribe. 09/08/2016. 12:10 PM.  History   Chief Complaint Chief Complaint  Patient presents with  . Wound Check     The history is provided by the patient and medical records. No language interpreter was used.    HPI Comments:  Richard [name] is a 83 y.o. male who presents to the Emergency Department for a wound check. Pt was evaluated in the ED on 09/06/16 five days after initial injury as he had difficulty controlling the bleeding to the site of injury on the right forearm. Pt fell while undressing ~ 1 week ago and scraped the arm. During his last ED visit the wound was cleaned and debrided. Pt was discharged on Keflex and advised to follow up within the next several days for a wound check. Pt denies acute fevers and chills. He has no other complaints or symptoms at this time. Pt is on eliquis for AFIB.    Past Medical History:  Diagnosis Date  . Anxiety   . Arthritis   . Atrial fibrillation ([place])    a. Dx 03/2013, notes report atrial fibrillation/atrial flutter, placed on amiodarone. NSR in subsequent OV's.  . B12 deficiency anemia   . Chest pain    a. H/o CTA negative for PE 2012, normal cath 2005, normal nucs previously including 05/2012.  Marland Kitchen Complete heart block (HCC)    a. s/p Medtronic Adapta L model ADDRL 1 (serial number [id number]) pacemaker.  Marland Kitchen GERD (gastroesophageal reflux disease)   . Hiatal hernia   . Hyperlipidemia   . Hypertension   . Iron deficiency anemia   . LBBB (left bundle branch block)   . Microcytic anemia   . Nephrolithiasis   . On home oxygen therapy    a. 2L w/CPAP at night  . On home oxygen therapy    "w/his CPAP at night; don't know how much" (05/21/2016)  .  Orthostatic hypotension   . OSA on CPAP   . Rocky Mountain spotted fever ~ 1945  . Sinus drainage   . Skin cancer of lip   . Stroke ([place])   . Type II diabetes mellitus [facility])     Patient Active Problem List   Diagnosis Date Noted  . AVM (arteriovenous malformation) of small bowel, acquired ([place])   . Abnormality of gait as late effect of stroke 05/27/2016  . Benign neoplasm of cecum   . Benign neoplasm of sigmoid colon   . GI bleeding 05/22/2016  . Melena   . GIB (gastrointestinal bleeding) 05/21/2016  . Diabetes mellitus with complication ([place])   . Upper GI bleed   . CKD (chronic kidney disease)   . Type 2 diabetes mellitus with circulatory disorder, without long-term current use of insulin ([place])   . Acute right PCA stroke ([place]) 05/08/2016  . Ataxia due to recent stroke   . Gait disturbance, post-stroke   . Cerebellar stroke, acute ([place]) 05/07/2016  . Hyperlipidemia   . Chronic anticoagulation   . Cardiac pacemaker in situ   . Acute blood loss anemia   . Cognitive deficits   . Cognitive deficit due to recent stroke   . Stroke-related cognitive dysfunction ([place])   . Generalized weakness 05/05/2016  . Dizziness   .  Absolute anemia 02/08/2016  . Weakness 02/01/2016  . Gastric polyp 06/21/2015  . Iron deficiency anemia due to chronic blood loss   . GERD (gastroesophageal reflux disease)   . B12 deficiency anemia   . Bradycardia with less than 30 beats per minute 05/31/2014  . Complete heart block ([place]) 05/31/2014  . Syncope 05/31/2014  . Anemia 05/31/2014  . Atrial fibrillation ([place]) 04/08/2013  . Diabetes mellitus ([place]) 04/06/2013  . Hiatal hernia 04/06/2013  . Essential hypertension 06/13/2011  . OSA (obstructive sleep apnea) 06/13/2011    Past Surgical History:  Procedure Laterality Date  . CARDIAC CATHETERIZATION     by Dr. Acie Fredrickson, January 24, 2004, that shows minimal coronary artery irregularities and normal left ventricular function  . CATARACT EXTRACTION W/  INTRAOCULAR LENS  IMPLANT, BILATERAL Bilateral   . COLONOSCOPY WITH PROPOFOL N/A 05/23/2016   Procedure: COLONOSCOPY WITH PROPOFOL;  Surgeon: Jerene Bears, MD;  Location: [place];  Service: Gastroenterology;  Laterality: N/A;  . ENTEROSCOPY N/A 06/02/2016   Procedure: ENTEROSCOPY;  Surgeon: Gatha Mayer, MD;  Location: [facility] ENDOSCOPY;  Service: Endoscopy;  Laterality: N/A;  . ESOPHAGOGASTRODUODENOSCOPY N/A 05/22/2016   Procedure: ESOPHAGOGASTRODUODENOSCOPY (EGD);  Surgeon: Jerene Bears, MD;  Location[facility] ENDOSCOPY;  Service: Endoscopy;  Laterality: N/A;  . ESOPHAGOGASTRODUODENOSCOPY (EGD) WITH PROPOFOL N/A 06/21/2015   Procedure: ESOPHAGOGASTRODUODENOSCOPY (EGD) WITH PROPOFOL;  Surgeon: Gatha Mayer, MD;  Location: [facility] ENDOSCOPY;  Service: Endoscopy;  Laterality: N/A;  . GIVENS CAPSULE STUDY N/A 05/23/2016   Procedure: GIVENS CAPSULE STUDY;  Surgeon: Jerene Bears, MD;  Location: [place];  Service: Gastroenterology;  Laterality: N/A;  . HOT HEMOSTASIS N/A 06/02/2016   Procedure: HOT HEMOSTASIS (ARGON PLASMA COAGULATION/BICAP);  Surgeon: Gatha Mayer, MD;  Location: [facility] ENDOS[facility];  Service: Endoscopy;  Laterality: N/A;  . INGUINAL HERNIA REPAIR Right   . LUMBAR DISC SURGERY  ~ 1993  . PERMANENT PACEMAKER INSERTION N/A 06/03/2014   MDT Adapta L implanted by Dr Rayann Heman for syncope and transient AV block  . SKIN CANCER EXCISION     "lower lip" (04/06/2013)  . TEMPORARY PACEMAKER INSERTION N/A 05/31/2014   Procedure: TEMPORARY WIRE;  Surgeon: Sinclair Grooms, MD;  Location: [facility] CATH LAB;  Service: Cardiovascular;  Laterality: N/A;  . VASECTOMY     Hx of     OB History    No data available       Home Medications    Prior to Admission medications   Medication Sig Start Date End Date Taking? Authorizing Provider  acetaminophen (TYLENOL) 500 MG tablet Take 500 mg by mouth 2 (two) times daily.    Yes Historical Provider, MD  amiodarone (PACERONE) 200 MG tablet Take 200 mg by mouth 3  (three) times a week. On Monday, Wednesday & Friday   Yes Historical Provider, MD  amLODipine (NORVASC) 10 MG tablet Take 1 tablet (10 mg total) by mouth daily. 05/16/16  Yes Ivan Anchors Love, PA-C  apixaban (ELIQUIS) 5 MG TABS tablet Take 1 tablet (5 mg total) by mouth 2 (two) times daily. 06/06/16  Yes Thayer Headings, MD  atorvastatin (LIPITOR) 40 MG tablet Take 1 tablet (40 mg total) by mouth daily at 6 PM. 05/16/16  Yes Ivan Anchors Love, PA-C  cephALEXin (KEFLEX) 500 MG capsule Take 1 capsule (500 mg total) by mouth 4 (four) times daily. 09/06/16  Yes Noemi Chapel, MD  cyanocobalamin (,VITAMIN B-12,) 1000 MCG/ML injection Inject 1,000 mcg into the muscle every 30 (thirty) days.  Yes Historical Provider, MD  diazepam (VALIUM) 5 MG tablet Take 1 tablet by mouth at bedtime. 06/09/16  Yes Historical Provider, MD  fluticasone (FLONASE) 50 MCG/ACT nasal spray Place 1 spray into the nose daily as needed for allergies or rhinitis.    Yes Historical Provider, MD  metoprolol (LOPRESSOR) 50 MG tablet TAKE 1 TABLET BY MOUTH TWICE A DAY 07/07/16  Yes Thayer Headings, MD  pantoprazole (PROTONIX) 40 MG tablet Take 1 tablet (40 mg total) by mouth daily at 6 (six) AM. 05/25/16  Yes Kelvin Cellar, MD  pioglitazone (ACTOS) 30 MG tablet Take 30 mg by mouth daily.     Yes Historical Provider, MD  sertraline (ZOLOFT) 100 MG tablet Take 1 tablet by mouth daily. 08/21/14  Yes Historical Provider, MD    Family History Family History  Problem Relation Age of Onset  . Thyroid disease Mother     goiter  . Pneumonia Father   . Other      Parents both died of old age; other conditions not known  . Stroke Brother   . Breast cancer Sister     Social History Social History  Substance Use Topics  . Smoking status: Former Smoker    Packs/day: 0.00    Years: 20.00    Types: Cigarettes  . Smokeless tobacco: Never Used     Comment: 04/06/2013 "quit smoking years ago; didn't smoke that much; probably 20 years; 1ppd"  .  Alcohol use No     Allergies   Codeine and Citalopram   Review of Systems Review of Systems  Constitutional: Negative for chills and fever.  Skin: Positive for wound.    Physical Exam Updated Vital Signs BP 136/67   Pulse 64   Temp 97.4 F (36.3 C) (Oral)   Resp 16   Ht 6\' 3"  (1.905 m)   Wt 256 lb (116.1 kg)   SpO2 97%   BMI 32.00 kg/m   Physical Exam  Constitutional: He is oriented to person, place, and time. He appears well-developed and well-nourished.  HENT:  Head: Normocephalic.  Pulmonary/Chest: Effort normal.  Abdominal: He exhibits no distension.  Musculoskeletal: Normal range of motion.  Neurological: He is alert and oriented to person, place, and time.  Skin:  Patient with dressings on right forearm from previous skin tears. On the palmar aspect of the forearm there was some green discoloration of the dressing. Otherwise the wounds all looked good. Some difficulty removing dressings and required soaking in saline. Does not appear infected at this time.  Nursing note and vitals reviewed.    ED Treatments / Results  COORDINATION OF CARE:  11:59 AM Discussed treatment plan with pt at bedside and pt agreed to plan.  Labs (all labs ordered are listed, but only abnormal results are displayed) Labs Reviewed - No data to display  EKG  EKG Interpretation None       Radiology No results found.  Procedures Procedures (including critical care time)  Medications Ordered in ED Medications - No data to display   Initial Impression / Assessment and Plan / ED Course  I have reviewed the triage vital signs and the nursing notes.  Pertinent labs & imaging results that were available during my care of the patient were reviewed by me and considered in my medical decision making (see chart for details).  Clinical Course     Patient with dressings that needed rechecking removed and was somewhat adherent to the wound. Nonadherent dressings placed. Does not  appear to be infected. Will discharge home.  Final Clinical Impressions(s) / ED Diagnoses   Final diagnoses:  Visit for wound check    New Prescriptions Discharge Medication List as of 09/08/2016  2:45 PM     I personally performed the services described in this documentation, which was scribed in my presence. The recorded information has been reviewed and is accurate.       Davonna Belling, MD 09/10/16 [phone]

## 2016-09-08 NOTE — ED Triage Notes (Signed)
Patient states he fell last week and was treated here on Saturday when wound began to bleed uncontrollably. States physician requested patient to return today for wound recheck. Patient has bandage noted to right forearm with small amount of greenish colored drainage noted through bandage.

## 2016-09-15 ENCOUNTER — Ambulatory Visit (INDEPENDENT_AMBULATORY_CARE_PROVIDER_SITE_OTHER): Payer: Medicare Other | Admitting: *Deleted

## 2016-09-15 DIAGNOSIS — I442 Atrioventricular block, complete: Secondary | ICD-10-CM | POA: Diagnosis not present

## 2016-09-16 LAB — CUP PACEART REMOTE DEVICE CHECK
Battery Remaining Longevity: 138 mo
Brady Statistic AP VP Percent: 23 %
Brady Statistic AP VS Percent: 12 %
Brady Statistic AS VS Percent: 49 %
Date Time Interrogation Session: 20180122155433
Implantable Lead Implant Date: 20151010
Implantable Lead Location: 753860
Lead Channel Impedance Value: 386 Ohm
Lead Channel Impedance Value: 708 Ohm
Lead Channel Pacing Threshold Amplitude: 0.625 V
Lead Channel Setting Sensing Sensitivity: 5.6 mV
MDC IDC LEAD IMPLANT DT: 20151010
MDC IDC LEAD LOCATION: 753859
MDC IDC MSMT BATTERY IMPEDANCE: 139 Ohm
MDC IDC MSMT BATTERY VOLTAGE: 2.79 V
MDC IDC MSMT LEADCHNL RA PACING THRESHOLD AMPLITUDE: 0.5 V
MDC IDC MSMT LEADCHNL RA PACING THRESHOLD PULSEWIDTH: 0.4 ms
MDC IDC MSMT LEADCHNL RV PACING THRESHOLD PULSEWIDTH: 0.4 ms
MDC IDC PG IMPLANT DT: 20151010
MDC IDC SET LEADCHNL RA PACING AMPLITUDE: 2 V
MDC IDC SET LEADCHNL RV PACING AMPLITUDE: 2.5 V
MDC IDC SET LEADCHNL RV PACING PULSEWIDTH: 0.4 ms
MDC IDC STAT BRADY AS VP PERCENT: 17 %

## 2016-09-16 NOTE — Progress Notes (Signed)
Remote pacemaker transmission.   

## 2016-09-17 ENCOUNTER — Encounter: Payer: Self-pay | Admitting: Cardiology

## 2016-09-18 ENCOUNTER — Other Ambulatory Visit (HOSPITAL_COMMUNITY): Payer: Self-pay | Admitting: *Deleted

## 2016-09-19 ENCOUNTER — Ambulatory Visit (HOSPITAL_COMMUNITY)
Admission: RE | Admit: 2016-09-19 | Discharge: 2016-09-19 | Disposition: A | Discharge status: Home / Self Care | Payer: Medicare Other | Source: Ambulatory Visit | Attending: Internal Medicine | Admitting: Internal Medicine

## 2016-09-19 DIAGNOSIS — D649 Anemia, unspecified: Secondary | ICD-10-CM | POA: Diagnosis not present

## 2016-09-19 MED ORDER — SODIUM CHLORIDE 0.9 % IV SOLN
510.0000 mg | INTRAVENOUS | Status: DC
  Administered 2016-09-19: 510 mg via INTRAVENOUS
  Filled 2016-09-19: qty 17

## 2016-09-25 ENCOUNTER — Other Ambulatory Visit (HOSPITAL_COMMUNITY): Payer: Self-pay | Admitting: *Deleted

## 2016-09-26 ENCOUNTER — Ambulatory Visit (HOSPITAL_COMMUNITY)
Admission: RE | Admit: 2016-09-26 | Discharge: 2016-09-26 | Disposition: A | Discharge status: Home / Self Care | Payer: Medicare Other | Source: Ambulatory Visit | Attending: Internal Medicine | Admitting: Internal Medicine

## 2016-09-26 DIAGNOSIS — D649 Anemia, unspecified: Secondary | ICD-10-CM | POA: Diagnosis not present

## 2016-09-26 MED ORDER — SODIUM CHLORIDE 0.9 % IV SOLN
510.0000 mg | INTRAVENOUS | Status: AC
  Administered 2016-09-26: 510 mg via INTRAVENOUS
  Filled 2016-09-26: qty 17

## 2016-09-29 NOTE — Progress Notes (Signed)
Electrophysiology Office Note Date: 10/01/2016  ID:  Richard [name], DOB 1931/02/15, MRN [id number]  PCP: Tivis Ringer, MD Primary Cardiologist: Nahser Electrophysiologist: Allred  CC: Pacemaker follow-up  Richard [name] is a 83 y.o. male seen today for Dr Rayann Heman.  He presents today for routine electrophysiology followup.  He was last seen in the fall of 2017 at which time Watchman was discussed 2/2 GI bleeding and frequent falls.  He subsequently cancelled TEE 2/2 weakness and has not been strong enough to feel like rescheduling. He continues to have frequent falls.   He denies chest pain, palpitations, dyspnea, PND, orthopnea, nausea, vomiting, dizziness, syncope, edema, weight gain, or early satiety.  Device History: MDT dual chamber PPM implanted 2015 for complete heart block    Past Medical History:  Diagnosis Date  . Anxiety   . Arthritis   . Atrial fibrillation ([place])    a. Dx 03/2013, notes report atrial fibrillation/atrial flutter, placed on amiodarone. NSR in subsequent OV's.  . B12 deficiency anemia   . Chest pain    a. H/o CTA negative for PE 2012, normal cath 2005, normal nucs previously including 05/2012.  Marland Kitchen Complete heart block (HCC)    a. s/p Medtronic Adapta L model ADDRL 1 (serial number [id number]) pacemaker.  Marland Kitchen GERD (gastroesophageal reflux disease)   . Hiatal hernia   . Hyperlipidemia   . Hypertension   . Iron deficiency anemia   . LBBB (left bundle branch block)   . Microcytic anemia   . Nephrolithiasis   . On home oxygen therapy    a. 2L w/CPAP at night  . On home oxygen therapy    "w/his CPAP at night; don't know how much" (05/21/2016)  . Orthostatic hypotension   . OSA on CPAP   . Rocky Mountain spotted fever ~ 1945  . Sinus drainage   . Skin cancer of lip   . Stroke ([place])   . Type II diabetes mellitus ([place])    Past Surgical History:  Procedure Laterality Date  . CARDIAC CATHETERIZATION     by Dr. Acie Fredrickson, January 24, 2004, that shows  minimal coronary artery irregularities and normal left ventricular function  . CATARACT EXTRACTION W/ INTRAOCULAR LENS  IMPLANT, BILATERAL Bilateral   . COLONOSCOPY WITH PROPOFOL N/A 05/23/2016   Procedure: COLONOSCOPY WITH PROPOFOL;  Surgeon: Jerene Bears, MD;  Location: [place];  Service: Gastroenterology;  Laterality: N/A;  . ENTEROSCOPY N/A 06/02/2016   Procedure: ENTEROSCOPY;  Surgeon: Gatha Mayer, MD;  Location: [facility] ENDOSCOPY;  Service: Endoscopy;  Laterality: N/A;  . ESOPHAGOGASTRODUODENOSCOPY N/A 05/22/2016   Procedure: ESOPHAGOGASTRODUODENOSCOPY (EGD);  Surgeon: Jerene Bears, MD;  Location: [facility] ENDOSCOPY;  Service: Endoscopy;  Laterality: N/A;  . ESOPHAGOGASTRODUODENOSCOPY (EGD) WITH PROPOFOL N/A 06/21/2015   Procedure: ESOPHAGOGASTRODUODENOSCOPY (EGD) WITH PROPOFOL;  Surgeon: Gatha Mayer, MD;  Location[facility]OS[facility];  Service: Endoscopy;  Laterality: N/A;  . GIVENS CAPSULE STUDY N/A 05/23/2016   Procedure: GIVENS CAPSULE STUDY;  Surgeon: Jerene Bears, MD;  Location: [place];  Service: Gastroenterology;  Laterality: N/A;  . HOT HEMOSTASIS N/A 06/02/2016   Procedure: HOT HEMOSTASIS (ARGON PLASMA COAGULATION/BICAP);  Surgeon: Gatha Mayer, MD;  Location: [facility];  Service: Endoscopy;  Laterality: N/A;  . INGUINAL HERNIA REPAIR Right   . LUMBAR DISC SURGERY  ~ 1993  . PERMANENT PACEMAKER INSERTION N/A 06/03/2014   MDT Adapta L implanted by Dr Rayann Heman for syncope and transient AV block  . SKIN CANCER EXCISION     "  lower lip" (04/06/2013)  . TEMPORARY PACEMAKER INSERTION N/A 05/31/2014   Procedure: TEMPORARY WIRE;  Surgeon: Sinclair Grooms, MD;  Location: [facility] CATH LAB;  Service: Cardiovascular;  Laterality: N/A;  . VASECTOMY     Hx of     Current Outpatient Prescriptions  Medication Sig Dispense Refill  . acetaminophen (TYLENOL) 500 MG tablet Take 500 mg by mouth 2 (two) times daily.     Marland Kitchen amiodarone (PACERONE) 200 MG tablet Take 200 mg by mouth 3 (three) times a week.  On Monday, Wednesday & Friday    . amLODipine (NORVASC) 10 MG tablet Take 1 tablet (10 mg total) by mouth daily. 30 tablet 0  . apixaban (ELIQUIS) 5 MG TABS tablet Take 1 tablet (5 mg total) by mouth 2 (two) times daily. 60 tablet 6  . atorvastatin (LIPITOR) 40 MG tablet Take 1 tablet (40 mg total) by mouth daily at 6 PM. 30 tablet 0  . cyanocobalamin (,VITAMIN B-12,) 1000 MCG/ML injection Inject 1,000 mcg into the muscle every 30 (thirty) days.    . diazepam (VALIUM) 5 MG tablet Take 1 tablet by mouth at bedtime.    . fluticasone (FLONASE) 50 MCG/ACT nasal spray Place 1 spray into the nose daily as needed for allergies or rhinitis.     . metoprolol (LOPRESSOR) 50 MG tablet TAKE 1 TABLET BY MOUTH TWICE A DAY 60 tablet 11  . pantoprazole (PROTONIX) 40 MG tablet Take 1 tablet (40 mg total) by mouth daily at 6 (six) AM. 30 tablet 1  . pioglitazone (ACTOS) 30 MG tablet Take 30 mg by mouth daily.      . sertraline (ZOLOFT) 100 MG tablet Take 1 tablet by mouth daily.     No current facility-administered medications for this visit.     Allergies:   Codeine and Citalopram   Social History: Social History   Social History  . Marital status: Married    Spouse name: N/A  . Number of children: 2  . Years of education: N/A   Occupational History  . retired    Social History Main Topics  . Smoking status: Former Smoker    Packs/day: 0.00    Years: 20.00    Types: Cigarettes  . Smokeless tobacco: Never Used     Comment: 04/06/2013 "quit smoking years ago; didn't smoke that much; probably 20 years; 1ppd"  . Alcohol use No  . Drug use: No  . Sexual activity: No   Other Topics Concern  . Not on file   Social History Narrative  . No narrative on file    Family History: Family History  Problem Relation Age of Onset  . Thyroid disease Mother     goiter  . Pneumonia Father   . Other      Parents both died of old age; other conditions not known  . Stroke Brother   . Breast cancer  Sister      Review of Systems: All other systems reviewed and are otherwise negative except as noted above.   Physical Exam: VS:  BP 140/68   Pulse 68   Ht 6\' 3"  (1.905 m)   Wt 263 lb 6.4 oz (119.5 kg)   SpO2 93%   BMI 32.92 kg/m  , BMI Body mass index is 32.92 kg/m.  GEN- The patient is elderly and chronically ill appearing, alert and oriented x 3 today.   HEENT: normocephalic, atraumatic; sclera clear, conjunctiva pink; hearing intact; oropharynx clear; neck supple  Lungs- Clear to  ausculation bilaterally, normal work of breathing.  No wheezes, rales, rhonchi Heart- Regular rate and rhythm  GI- soft, non-tender, non-distended, bowel sounds present  Extremities- no clubbing, cyanosis, or edema  MS- no significant deformity or atrophy Skin- warm and dry, no rash or lesion; PPM pocket well healed Psych- euthymic mood, full affect Neuro- strength and sensation are intact  PPM Interrogation- reviewed in detail today,  See PACEART report  EKG:  EKG is not ordered today.  Recent Labs: 05/22/2016: ALT 15 05/24/2016: BUN 7; Creatinine, Ser 1.10; Potassium 3.7; Sodium 139 05/27/2016: Hemoglobin 9.3; Platelets 225.0   Wt Readings from Last 3 Encounters:  10/01/16 263 lb 6.4 oz (119.5 kg)  09/26/16 256 lb (116.1 kg)  09/08/16 256 lb (116.1 kg)     Other studies Reviewed: Additional studies/ records that were reviewed today include: Dr Acie Fredrickson and Dr Jackalyn Lombard office notes  Assessment and Plan:  1.  Complete heart block Normal PPM function See Pace Art report No changes today  2.  Paroxysmal atrial fibrillation Burden by device interrogation 0% Currently tolerating Eliquis CHADS2VASC is at least 6 With generalized weakness and advanced age, he is at increased risks for any procedure. I do not think that he is currently a good watchman candidate with co-morbidities.   Continue amiodarone 3 days/week. TSH, LFT's at next office visit   3.  HTN Stable No change required  today    Current medicines are reviewed at length with the patient today.   The patient does not have concerns regarding his medicines.  The following changes were made today:  none  Labs/ tests ordered today include: none No orders of the defined types were placed in this encounter.    Disposition:   Follow up with [facility], Dr Acie Fredrickson as scheduled, Dr Rayann Heman 1 year     Signed, Chanetta Marshall, NP 10/01/2016 9:36 AM  [address] [place] [address] [phone] (office) [phone] (fax)

## 2016-10-01 ENCOUNTER — Other Ambulatory Visit: Payer: Self-pay | Admitting: Cardiovascular Disease

## 2016-10-01 ENCOUNTER — Ambulatory Visit (INDEPENDENT_AMBULATORY_CARE_PROVIDER_SITE_OTHER): Payer: Medicare Other | Admitting: Nurse Practitioner

## 2016-10-01 ENCOUNTER — Encounter: Payer: Self-pay | Admitting: Nurse Practitioner

## 2016-10-01 VITALS — BP 140/68 | HR 68 | Ht 75.0 in | Wt 263.4 lb

## 2016-10-01 DIAGNOSIS — I270 Primary pulmonary hypertension: Secondary | ICD-10-CM | POA: Diagnosis not present

## 2016-10-01 DIAGNOSIS — I480 Paroxysmal atrial fibrillation: Secondary | ICD-10-CM | POA: Diagnosis not present

## 2016-10-01 DIAGNOSIS — I442 Atrioventricular block, complete: Secondary | ICD-10-CM

## 2016-10-01 NOTE — Patient Instructions (Signed)
Your physician recommends that you continue on your current medications as directed. Please refer to the Current Medication list given to you today.    Labwork: None ordered  Testing/Procedures: None ordered  Follow-Up: Remote monitoring is used to monitor your Pacemaker of ICD from home. This monitoring reduces the number of office visits required to check your device to one time per year. It allows [place] to keep an eye on the functioning of your device to ensure it is working properly. You are scheduled for a device check from home on 12/31/16. You may send your transmission at any time that day. If you have a wireless device, the transmission will be sent automatically. After your physician reviews your transmission, you will receive a postcard with your next transmission date.  Your physician wants you to follow-up in: 1 year with Dr.Allred You will receive a reminder letter in the mail two months in advance. If you don't receive a letter, please call our office to schedule the follow-up appointment.   Any Other Special Instructions Will Be Listed Below (If Applicable).     If you need a refill on your cardiac medications before your next appointment, please call your pharmacy.

## 2016-10-02 ENCOUNTER — Other Ambulatory Visit: Payer: Self-pay | Admitting: Cardiovascular Disease

## 2016-10-02 LAB — CUP PACEART INCLINIC DEVICE CHECK
Implantable Lead Implant Date: 20151010
Implantable Lead Implant Date: 20151010
Implantable Lead Location: 753860
Implantable Pulse Generator Implant Date: 20151010
MDC IDC LEAD LOCATION: 753859
MDC IDC SESS DTM: 20180208081916

## 2016-10-07 DIAGNOSIS — M5416 Radiculopathy, lumbar region: Secondary | ICD-10-CM | POA: Diagnosis not present

## 2016-10-07 DIAGNOSIS — E538 Deficiency of other specified B group vitamins: Secondary | ICD-10-CM | POA: Diagnosis not present

## 2016-10-13 ENCOUNTER — Emergency Department (HOSPITAL_COMMUNITY): Payer: Medicare Other

## 2016-10-13 ENCOUNTER — Encounter (HOSPITAL_COMMUNITY): Payer: Self-pay | Admitting: Emergency Medicine

## 2016-10-13 ENCOUNTER — Emergency Department (HOSPITAL_COMMUNITY)
Admission: EM | Admit: 2016-10-13 | Discharge: 2016-10-13 | Disposition: A | Discharge status: Home / Self Care | Payer: Medicare Other | Attending: Emergency Medicine | Admitting: Emergency Medicine

## 2016-10-13 DIAGNOSIS — E1159 Type 2 diabetes mellitus with other circulatory complications: Secondary | ICD-10-CM | POA: Insufficient documentation

## 2016-10-13 DIAGNOSIS — Z7901 Long term (current) use of anticoagulants: Secondary | ICD-10-CM | POA: Diagnosis not present

## 2016-10-13 DIAGNOSIS — I959 Hypotension, unspecified: Secondary | ICD-10-CM | POA: Diagnosis not present

## 2016-10-13 DIAGNOSIS — M545 Low back pain: Secondary | ICD-10-CM | POA: Diagnosis not present

## 2016-10-13 DIAGNOSIS — Z85819 Personal history of malignant neoplasm of unspecified site of lip, oral cavity, and pharynx: Secondary | ICD-10-CM | POA: Insufficient documentation

## 2016-10-13 DIAGNOSIS — Z8673 Personal history of transient ischemic attack (TIA), and cerebral infarction without residual deficits: Secondary | ICD-10-CM | POA: Diagnosis not present

## 2016-10-13 DIAGNOSIS — Y939 Activity, unspecified: Secondary | ICD-10-CM | POA: Insufficient documentation

## 2016-10-13 DIAGNOSIS — Z950 Presence of cardiac pacemaker: Secondary | ICD-10-CM | POA: Diagnosis not present

## 2016-10-13 DIAGNOSIS — I129 Hypertensive chronic kidney disease with stage 1 through stage 4 chronic kidney disease, or unspecified chronic kidney disease: Secondary | ICD-10-CM | POA: Insufficient documentation

## 2016-10-13 DIAGNOSIS — E1122 Type 2 diabetes mellitus with diabetic chronic kidney disease: Secondary | ICD-10-CM | POA: Insufficient documentation

## 2016-10-13 DIAGNOSIS — Z79899 Other long term (current) drug therapy: Secondary | ICD-10-CM | POA: Insufficient documentation

## 2016-10-13 DIAGNOSIS — W19XXXA Unspecified fall, initial encounter: Secondary | ICD-10-CM | POA: Diagnosis not present

## 2016-10-13 DIAGNOSIS — Y999 Unspecified external cause status: Secondary | ICD-10-CM | POA: Insufficient documentation

## 2016-10-13 DIAGNOSIS — M5489 Other dorsalgia: Secondary | ICD-10-CM | POA: Diagnosis not present

## 2016-10-13 DIAGNOSIS — G8929 Other chronic pain: Secondary | ICD-10-CM

## 2016-10-13 DIAGNOSIS — Z87891 Personal history of nicotine dependence: Secondary | ICD-10-CM | POA: Insufficient documentation

## 2016-10-13 DIAGNOSIS — Y929 Unspecified place or not applicable: Secondary | ICD-10-CM | POA: Insufficient documentation

## 2016-10-13 DIAGNOSIS — N189 Chronic kidney disease, unspecified: Secondary | ICD-10-CM | POA: Diagnosis not present

## 2016-10-13 DIAGNOSIS — Z7984 Long term (current) use of oral hypoglycemic drugs: Secondary | ICD-10-CM | POA: Insufficient documentation

## 2016-10-13 MED ORDER — ONDANSETRON 4 MG PO TBDP
4.0000 mg | ORAL_TABLET | Freq: Once | ORAL | Status: AC
  Administered 2016-10-13: 4 mg via ORAL
  Filled 2016-10-13: qty 1

## 2016-10-13 MED ORDER — HYDROCODONE-ACETAMINOPHEN 5-325 MG PO TABS: 2.0000 | ORAL_TABLET | ORAL | 0 refills | Status: DC | PRN

## 2016-10-13 MED ORDER — MORPHINE SULFATE (PF) 10 MG/ML IV SOLN
10.0000 mg | Freq: Once | INTRAVENOUS | Status: AC
  Administered 2016-10-13: 10 mg via INTRAMUSCULAR
  Filled 2016-10-13: qty 1

## 2016-10-13 NOTE — Discharge Instructions (Signed)
Take a dose of your stool softener every day that you take pain medication.

## 2016-10-13 NOTE — ED Triage Notes (Signed)
Per EMS- pt takes yearly injections for back pain. Pt sees [facility] for this. Had an injection last Tuesday. Thursday pt had a mechanical fall, did not hit his head but landed on abdomen. Pt reports since the fall he has had back pain 10/10 and is unable to even get up out of the bed. This is atypical for him.

## 2016-10-13 NOTE — ED Provider Notes (Signed)
[facility] DEPT Provider Note   CSN: [id number] Arrival date & time: 10/13/16  1107     History   Chief Complaint Chief Complaint  Patient presents with  . Back Pain    HPI Richard [name] is a 83 y.o. male. Chief complaint is back pain.  HPI:  Patient presents for evaluation of an exacerbation of back pain. He has chronic back pain and follows with Dr. Dossie Der. He gets injections into his back several times per year. He had some injections last Tuesday. 20 was day and Thursday his symptoms worsens. His back pain but does not have leg weakness. No fever. No bowel or bladder changes. He states that it was bad to the point that he stood up and his pain was so bad that he collapsed and fell for onto his hands. This was on Thursday. His pain has been worse since that time. He is on no regular pain medications at home for his back.  Past Medical History:  Diagnosis Date  . Anxiety   . Arthritis   . Atrial fibrillation ([place])    a. Dx 03/2013, notes report atrial fibrillation/atrial flutter, placed on amiodarone. NSR in subsequent OV's.  . B12 deficiency anemia   . Chest pain    a. H/o CTA negative for PE 2012, normal cath 2005, normal nucs previously including 05/2012.  Marland Kitchen Complete heart block (HCC)    a. s/p Medtronic Adapta L model ADDRL 1 (serial number [id number]) pacemaker.  Marland Kitchen GERD (gastroesophageal reflux disease)   . Hiatal hernia   . Hyperlipidemia   . Hypertension   . Iron deficiency anemia   . LBBB (left bundle branch block)   . Microcytic anemia   . Nephrolithiasis   . On home oxygen therapy    a. 2L w/CPAP at night  . On home oxygen therapy    "w/his CPAP at night; don't know how much" (05/21/2016)  . Orthostatic hypotension   . OSA on CPAP   . Rocky Mountain spotted fever ~ 1945  . Sinus drainage   . Skin cancer of lip   . Stroke ([place])   . Type II diabetes mellitus [facility])     Patient Active Problem List   Diagnosis Date Noted  . AVM (arteriovenous  malformation) of small bowel, acquired ([place])   . Abnormality of gait as late effect of stroke 05/27/2016  . Benign neoplasm of cecum   . Benign neoplasm of sigmoid colon   . GI bleeding 05/22/2016  . Melena   . GIB (gastrointestinal bleeding) 05/21/2016  . Diabetes mellitus with complication ([place])   . Upper GI bleed   . CKD (chronic kidney disease)   . Type 2 diabetes mellitus with circulatory disorder, without long-term current use of insulin ([place])   . Acute right PCA stroke ([place]) 05/08/2016  . Ataxia due to recent stroke   . Gait disturbance, post-stroke   . Cerebellar stroke, acute ([place]) 05/07/2016  . Hyperlipidemia   . Chronic anticoagulation   . Cardiac pacemaker in situ   . Acute blood loss anemia   . Cognitive deficits   . Cognitive deficit due to recent stroke   . Stroke-related cognitive dysfunction ([place])   . Generalized weakness 05/05/2016  . Dizziness   . Absolute anemia 02/08/2016  . Weakness 02/01/2016  . Gastric polyp 06/21/2015  . Iron deficiency anemia due to chronic blood loss   . GERD (gastroesophageal reflux disease)   . B12 deficiency anemia   . Bradycardia with  less than 30 beats per minute 05/31/2014  . Complete heart block ([place]) 05/31/2014  . Syncope 05/31/2014  . Anemia 05/31/2014  . Atrial fibrillation ([place]) 04/08/2013  . Diabetes mellitus ([place]) 04/06/2013  . Hiatal hernia 04/06/2013  . Essential hypertension 06/13/2011  . OSA (obstructive sleep apnea) 06/13/2011    Past Surgical History:  Procedure Laterality Date  . CARDIAC CATHETERIZATION     by Dr. Acie Fredrickson, January 24, 2004, that shows minimal coronary artery irregularities and normal left ventricular function  . CATARACT EXTRACTION W/ INTRAOCULAR LENS  IMPLANT, BILATERAL Bilateral   . COLONOSCOPY WITH PROPOFOL N/A 05/23/2016   Procedure: COLONOSCOPY WITH PROPOFOL;  Surgeon: Jerene Bears, MD;  Location: [facility];  Service: Gastroenterology;  Laterality: N/A;  . ENTEROSCOPY N/A 06/02/2016    Procedure: ENTEROSCOPY;  Surgeon: Gatha Mayer, MD;  [facility] ENDOSCOPY;  Service: Endoscopy;  Laterality: N/A;  . ESOPHAGOGASTRODUODENOSCOPY N/A 05/22/2016   Procedure: ESOPHAGOGASTRODUODENOSCOPY (EGD);  Surgeon: Jerene Bears, MD;  Location: [facility] ENDOSCOPY;  Service: Endoscopy;  Laterality: N/A;  . ESOPHAGOGASTRODUODENOSCOPY (EGD) WITH PROPOFOL N/A 06/21/2015   Procedure: ESOPHAGOGASTRODUODENOSCOPY (EGD) WITH PROPOFOL;  Surgeon: Gatha Mayer, MD;  Location[facility]OSCOPY;  Service: Endoscopy;  Laterality: N/A;  . GIVENS CAPSULE STUDY N/A 05/23/2016   Procedure: GIVENS CAPSULE STUDY;  Surgeon: Jerene Bears, MD;  Location: [facility];  Service: Gastroenterology;  Laterality: N/A;  . HOT HEMOSTASIS N/A 06/02/2016   Procedure: HOT HEMOSTASIS (ARGON PLASMA COAGULATION/BICAP);  Surgeon: Gatha Mayer, MD;  Location: [facility];  Service: Endoscopy;  Laterality: N/A;  . INGUINAL HERNIA REPAIR Right   . LUMBAR DISC SURGERY  ~ 1993  . PERMANENT PACEMAKER INSERTION N/A 06/03/2014   MDT Adapta L implanted by Dr Rayann Heman for syncope and transient AV block  . SKIN CANCER EXCISION     "lower lip" (04/06/2013)  . TEMPORARY PACEMAKER INSERTION N/A 05/31/2014   Procedure: TEMPORARY WIRE;  Surgeon: Sinclair Grooms, MD;  Location: [facility] CATH LAB;  Service: Cardiovascular;  Laterality: N/A;  . VASECTOMY     Hx of     OB History    No data available       Home Medications    Prior to Admission medications   Medication Sig Start Date End Date Taking? Authorizing Provider  acetaminophen (TYLENOL) 500 MG tablet Take 500 mg by mouth 2 (two) times daily.     Historical Provider, MD  amiodarone (PACERONE) 200 MG tablet TAKE 1 TABLET (200 MG TOTAL) BY MOUTH DAILY. 10/03/16   Thayer Headings, MD  amLODipine (NORVASC) 10 MG tablet Take 1 tablet (10 mg total) by mouth daily. 05/16/16   Bary Leriche, PA-C  apixaban (ELIQUIS) 5 MG TABS tablet Take 1 tablet (5 mg total) by mouth 2 (two) times daily. 06/06/16    Thayer Headings, MD  atorvastatin (LIPITOR) 40 MG tablet Take 1 tablet (40 mg total) by mouth daily at 6 PM. 05/16/16   Ivan Anchors Love, PA-C  cyanocobalamin (,VITAMIN B-12,) 1000 MCG/ML injection Inject 1,000 mcg into the muscle every 30 (thirty) days.    Historical Provider, MD  diazepam (VALIUM) 5 MG tablet Take 1 tablet by mouth at bedtime. 06/09/16   Historical Provider, MD  fluticasone (FLONASE) 50 MCG/ACT nasal spray Place 1 spray into the nose daily as needed for allergies or rhinitis.     Historical Provider, MD  HYDROcodone-acetaminophen (NORCO/VICODIN) 5-325 MG tablet Take 2 tablets by mouth every 4 (four) hours as needed. 10/13/16  Tanna Furry, MD  metoprolol (LOPRESSOR) 50 MG tablet TAKE 1 TABLET BY MOUTH TWICE A DAY 07/07/16   Thayer Headings, MD  pantoprazole (PROTONIX) 40 MG tablet Take 1 tablet (40 mg total) by mouth daily at 6 (six) AM. 05/25/16   Kelvin Cellar, MD  pioglitazone (ACTOS) 30 MG tablet Take 30 mg by mouth daily.      Historical Provider, MD  sertraline (ZOLOFT) 100 MG tablet Take 1 tablet by mouth daily. 08/21/14   Historical Provider, MD    Family History Family History  Problem Relation Age of Onset  . Thyroid disease Mother     goiter  . Pneumonia Father   . Other      Parents both died of old age; other conditions not known  . Stroke Brother   . Breast cancer Sister     Social History Social History  Substance Use Topics  . Smoking status: Former Smoker    Packs/day: 0.00    Years: 20.00    Types: Cigarettes  . Smokeless tobacco: Never Used     Comment: 04/06/2013 "quit smoking years ago; didn't smoke that much; probably 20 years; 1ppd"  . Alcohol use No     Allergies   Codeine and Citalopram   Review of Systems Review of Systems  Constitutional: Negative for appetite change, chills, diaphoresis, fatigue and fever.  HENT: Negative for mouth sores, sore throat and trouble swallowing.   Eyes: Negative for visual disturbance.  Respiratory:  Negative for cough, chest tightness, shortness of breath and wheezing.   Cardiovascular: Negative for chest pain.  Gastrointestinal: Negative for abdominal distention, abdominal pain, diarrhea, nausea and vomiting.  Endocrine: Negative for polydipsia, polyphagia and polyuria.  Genitourinary: Negative for dysuria, frequency and hematuria.  Musculoskeletal: Positive for back pain. Negative for gait problem.  Skin: Negative for color change, pallor and rash.  Neurological: Negative for dizziness, syncope, light-headedness and headaches.  Hematological: Does not bruise/bleed easily.  Psychiatric/Behavioral: Negative for behavioral problems and confusion.     Physical Exam Updated Vital Signs BP 127/70   Pulse 60   Temp 98.7 F (37.1 C) (Oral)   Resp 20   SpO2 90%   Physical Exam  Constitutional: He is oriented to person, place, and time. He appears well-developed and well-nourished. No distress.  HENT:  Head: Normocephalic.  Eyes: Conjunctivae are normal. Pupils are equal, round, and reactive to light. No scleral icterus.  Neck: Normal range of motion. Neck supple. No thyromegaly present.  Cardiovascular: Normal rate and regular rhythm.  Exam reveals no gallop and no friction rub.   No murmur heard. Pulmonary/Chest: Effort normal and breath sounds normal. No respiratory distress. He has no wheezes. He has no rales.  Abdominal: Soft. Bowel sounds are normal. He exhibits no distension. There is no tenderness. There is no rebound.  Musculoskeletal: Normal range of motion.       Back:  Tenderness seems to originate near the SI joints. This is where his Band-Aids are from his recent injections. Not fluctuant. Not erythematous. More pain with movement and minimal to no pain to palpate. No erythema or obvious a lytic changes of the skin.  Neurological: He is alert and oriented to person, place, and time.  Normal symmetric Strength to shoulder shrug, triceps, biceps, grip,wrist  flex/extend,and intrinsics  Norma lsymmetric sensation above and below clavicles, and to all distributions to UEs. Norma symmetric strength to flex/.extend hip and knees, dorsi/plantar flex ankles. Normal symmetric sensation to all distributions to LEs Patellar  and achilles reflexes 1-2+. Downgoing Babinski   Skin: Skin is warm and dry. No rash noted.  Psychiatric: He has a normal mood and affect. His behavior is normal.     ED Treatments / Results  Labs (all labs ordered are listed, but only abnormal results are displayed) Labs Reviewed - No data to display  EKG  EKG Interpretation None       Radiology Dg Lumbar Spine Complete  Result Date: 10/13/2016 CLINICAL DATA:  Chronic back pain.  Fall. EXAM: LUMBAR SPINE - COMPLETE 4+ VIEW COMPARISON:  CT lumbar spine 06/21/2010. FINDINGS: L1 and L2 mild moderate compression fractures are noted. Diffuse multilevel degenerative change. Diffuse osteopenia. Aortoiliac atherosclerotic vascular calcification. IMPRESSION: 1. L1 and L2 mild moderate compression fractures, these are new from prior CT lumbar spine 06/21/2010. 2.  Aortoiliac atherosclerotic vascular disease. Electronically Signed   By: Marcello Moores  Register   On: 10/13/2016 12:22    Procedures Procedures (including critical care time)  Medications Ordered in ED Medications  Morphine Sulfate (PF) SOLN 10 mg (10 mg Intramuscular Given 10/13/16 1145)  ondansetron (ZOFRAN-ODT) disintegrating tablet 4 mg (4 mg Oral Given 10/13/16 1145)     Initial Impression / Assessment and Plan / ED Course  I have reviewed the triage vital signs and the nursing notes.  Pertinent labs & imaging results that were available during my care of the patient were reviewed by me and considered in my medical decision making (see chart for details).  Clinical Course as of Oct 14 1303  Mon Oct 13, 2016  1247 DG Lumbar Spine Complete [MJ]  1247 DG Lumbar Spine Complete [MJ]    Clinical Course User Index [MJ]  Tanna Furry, MD    Exacerbation of chronic pain after fall. We'll obtain plain x-rays to rule out compression or other acute bony abnormality. May need simple pain control for short time.    Final Clinical Impressions(s) / ED Diagnoses   Final diagnoses:  Chronic bilateral low back pain without sciatica    New Prescriptions New Prescriptions   HYDROCODONE-ACETAMINOPHEN (NORCO/VICODIN) 5-325 MG TABLET    Take 2 tablets by mouth every 4 (four) hours as needed.     Tanna Furry, MD 10/13/16 [phone]

## 2016-10-13 NOTE — ED Notes (Signed)
Patient transported to X-ray 

## 2016-10-17 DIAGNOSIS — M5416 Radiculopathy, lumbar region: Secondary | ICD-10-CM | POA: Diagnosis not present

## 2016-10-19 ENCOUNTER — Encounter (HOSPITAL_COMMUNITY): Payer: Self-pay | Admitting: Emergency Medicine

## 2016-10-19 ENCOUNTER — Emergency Department (HOSPITAL_COMMUNITY)
Admission: EM | Admit: 2016-10-19 | Discharge: 2016-10-19 | Disposition: A | Discharge status: Home / Self Care | Payer: Medicare Other | Attending: Emergency Medicine | Admitting: Emergency Medicine

## 2016-10-19 DIAGNOSIS — Z85828 Personal history of other malignant neoplasm of skin: Secondary | ICD-10-CM | POA: Insufficient documentation

## 2016-10-19 DIAGNOSIS — I129 Hypertensive chronic kidney disease with stage 1 through stage 4 chronic kidney disease, or unspecified chronic kidney disease: Secondary | ICD-10-CM | POA: Insufficient documentation

## 2016-10-19 DIAGNOSIS — K5901 Slow transit constipation: Secondary | ICD-10-CM | POA: Insufficient documentation

## 2016-10-19 DIAGNOSIS — Z87891 Personal history of nicotine dependence: Secondary | ICD-10-CM | POA: Insufficient documentation

## 2016-10-19 DIAGNOSIS — N189 Chronic kidney disease, unspecified: Secondary | ICD-10-CM | POA: Insufficient documentation

## 2016-10-19 DIAGNOSIS — Z8673 Personal history of transient ischemic attack (TIA), and cerebral infarction without residual deficits: Secondary | ICD-10-CM | POA: Insufficient documentation

## 2016-10-19 DIAGNOSIS — K5900 Constipation, unspecified: Secondary | ICD-10-CM | POA: Diagnosis present

## 2016-10-19 DIAGNOSIS — Z950 Presence of cardiac pacemaker: Secondary | ICD-10-CM | POA: Insufficient documentation

## 2016-10-19 DIAGNOSIS — Z7901 Long term (current) use of anticoagulants: Secondary | ICD-10-CM | POA: Insufficient documentation

## 2016-10-19 DIAGNOSIS — E1122 Type 2 diabetes mellitus with diabetic chronic kidney disease: Secondary | ICD-10-CM | POA: Diagnosis not present

## 2016-10-19 MED ORDER — POLYETHYLENE GLYCOL 3350 17 GM/SCOOP PO POWD: ORAL | 0 refills | Status: DC

## 2016-10-19 MED ORDER — MINERAL OIL RE ENEM
1.0000 | ENEMA | Freq: Once | RECTAL | Status: AC
  Administered 2016-10-19: 1 via RECTAL
  Filled 2016-10-19: qty 1

## 2016-10-19 NOTE — ED Notes (Signed)
edp at bedside  

## 2016-10-19 NOTE — Discharge Instructions (Signed)
It is nice take care of you. We did disimpaction and enema to get some of the stool out. We gave you prescription for Miralax that you can take at home for bowel clean out. Mix 6 cups in 32 ounce of Gatorade and drink once. Then mix 3 cups in 16 ounce of fluid and drink twice a day until you have a bowel movement at least every other day. Please call a scheduled follow-up appointment with your primary care doctor. Your pain medicine (Norco) can make your constipation worse. Avoid taking it unless your back pain is unbearable. Please seek immediate care if you have severe abdominal pain, persistent vomiting, bleeding or other symptoms concerning to you.

## 2016-10-19 NOTE — ED Triage Notes (Signed)
Pt sts fell last Tuesday and has not had BM since then; pt sts abd pain but denies N/V

## 2016-10-19 NOTE — ED Provider Notes (Signed)
[facility] DEPT Provider Note   CSN: [id number] Arrival date & time: 10/19/16  1000     History   Chief Complaint Chief Complaint  Patient presents with  . Abdominal Pain  . Constipation    HPI Richard [name] is a 83 y.o. male.  HPI Richard [name] is a 83 yo male with With atrial fibrillation,  complete AV block status post pacemaker and diabetes who presents with constipation for 10 days. States that his last bowel movement was about 10 days ago. She denies urinary symptoms. Denies fever, chills, nausea, vomiting or abdominal pain. He tried senna and Colace at home without success. Patient was seen in ED about 6 days ago for back pain after fall and he was prescribed Norco.   Past Medical History:  Diagnosis Date  . Anxiety   . Arthritis   . Atrial fibrillation ([place])    a. Dx 03/2013, notes report atrial fibrillation/atrial flutter, placed on amiodarone. NSR in subsequent OV's.  . B12 deficiency anemia   . Chest pain    a. H/o CTA negative for PE 2012, normal cath 2005, normal nucs previously including 05/2012.  Marland Kitchen Complete heart block (HCC)    a. s/p Medtronic Adapta L model ADDRL 1 (serial number [id number]) pacemaker.  Marland Kitchen GERD (gastroesophageal reflux disease)   . Hiatal hernia   . Hyperlipidemia   . Hypertension   . Iron deficiency anemia   . LBBB (left bundle branch block)   . Microcytic anemia   . Nephrolithiasis   . On home oxygen therapy    a. 2L w/CPAP at night  . On home oxygen therapy    "w/his CPAP at night; don't know how much" (05/21/2016)  . Orthostatic hypotension   . OSA on CPAP   . Rocky Mountain spotted fever ~ 1945  . Sinus drainage   . Skin cancer of lip   . Stroke ([place])   . Type II diabetes mellitus [facility])     Patient Active Problem List   Diagnosis Date Noted  . AVM (arteriovenous malformation) of small bowel, acquired ([place])   . Abnormality of gait as late effect of stroke 05/27/2016  . Benign neoplasm of cecum   . Benign neoplasm  of sigmoid colon   . GI bleeding 05/22/2016  . Melena   . GIB (gastrointestinal bleeding) 05/21/2016  . Diabetes mellitus with complication ([place])   . Upper GI bleed   . CKD (chronic kidney disease)   . Type 2 diabetes mellitus with circulatory disorder, without long-term current use of insulin ([place])   . Acute right PCA stroke ([place]) 05/08/2016  . Ataxia due to recent stroke   . Gait disturbance, post-stroke   . Cerebellar stroke, acute ([place]) 05/07/2016  . Hyperlipidemia   . Chronic anticoagulation   . Cardiac pacemaker in situ   . Acute blood loss anemia   . Cognitive deficits   . Cognitive deficit due to recent stroke   . Stroke-related cognitive dysfunction ([place])   . Generalized weakness 05/05/2016  . Dizziness   . Absolute anemia 02/08/2016  . Weakness 02/01/2016  . Gastric polyp 06/21/2015  . Iron deficiency anemia due to chronic blood loss   . GERD (gastroesophageal reflux disease)   . B12 deficiency anemia   . Bradycardia with less than 30 beats per minute 05/31/2014  . Complete heart block ([place]) 05/31/2014  . Syncope 05/31/2014  . Anemia 05/31/2014  . Atrial fibrillation ([place]) 04/08/2013  . Diabetes mellitus ([place]) 04/06/2013  .  Hiatal hernia 04/06/2013  . Essential hypertension 06/13/2011  . OSA (obstructive sleep apnea) 06/13/2011    Past Surgical History:  Procedure Laterality Date  . CARDIAC CATHETERIZATION     by Dr. Acie Fredrickson, January 24, 2004, that shows minimal coronary artery irregularities and normal left ventricular function  . CATARACT EXTRACTION W/ INTRAOCULAR LENS  IMPLANT, BILATERAL Bilateral   . COLONOSCOPY WITH PROPOFOL N/A 05/23/2016   Procedure: COLONOSCOPY WITH PROPOFOL;  Surgeon: Jerene Bears, MD;  Location: [facility];  Service: Gastroenterology;  Laterality: N/A;  . ENTEROSCOPY N/A 06/02/2016   Procedure: ENTEROSCOPY;  Surgeon: Gatha Mayer, MD;  Location[facility] ENDOSCOPY;  Service: Endoscopy;  Laterality: N/A;  . ESOPHAGOGASTRODUODENOSCOPY N/A  05/22/2016   Procedure: ESOPHAGOGASTRODUODENOSCOPY (EGD);  Surgeon: Jerene Bears, MD;  [facility] ENDOSCOPY;  Service: Endoscopy;  Laterality: N/A;  . ESOPHAGOGASTRODUODENOSCOPY (EGD) WITH PROPOFOL N/A 06/21/2015   Procedure: ESOPHAGOGASTRODUODENOSCOPY (EGD) WITH PROPOFOL;  Surgeon: Gatha Mayer, MD;  [facility] ENDOSCOPY;  Service: Endoscopy;  Laterality: N/A;  . GIVENS CAPSULE STUDY N/A 05/23/2016   Procedure: GIVENS CAPSULE STUDY;  Surgeon: Jerene Bears, MD;  Location: [facility];  Service: Gastroenterology;  Laterality: N/A;  . HOT HEMOSTASIS N/A 06/02/2016   Procedure: HOT HEMOSTASIS (ARGON PLASMA COAGULATION/BICAP);  Surgeon: Gatha Mayer, MD;  Location: [facility];  Service: Endoscopy;  Laterality: N/A;  . INGUINAL HERNIA REPAIR Right   . LUMBAR DISC SURGERY  ~ 1993  . PERMANENT PACEMAKER INSERTION N/A 06/03/2014   MDT Adapta L implanted by Dr Rayann Heman for syncope and transient AV block  . SKIN CANCER EXCISION     "lower lip" (04/06/2013)  . TEMPORARY PACEMAKER INSERTION N/A 05/31/2014   Procedure: TEMPORARY WIRE;  Surgeon: Sinclair Grooms, MD;  Location: [facility] CATH LAB;  Service: Cardiovascular;  Laterality: N/A;  . VASECTOMY     Hx of     OB History    No data available       Home Medications    Prior to Admission medications   Medication Sig Start Date End Date Taking? Authorizing Provider  acetaminophen (TYLENOL) 500 MG tablet Take 500 mg by mouth 2 (two) times daily.    Yes Historical Provider, MD  amiodarone (PACERONE) 200 MG tablet TAKE 1 TABLET (200 MG TOTAL) BY MOUTH DAILY. 10/03/16  Yes Thayer Headings, MD  amLODipine (NORVASC) 10 MG tablet Take 1 tablet (10 mg total) by mouth daily. 05/16/16  Yes Ivan Anchors Love, PA-C  apixaban (ELIQUIS) 5 MG TABS tablet Take 1 tablet (5 mg total) by mouth 2 (two) times daily. 06/06/16  Yes Thayer Headings, MD  atorvastatin (LIPITOR) 40 MG tablet Take 1 tablet (40 mg total) by mouth daily at 6 PM. 05/16/16  Yes Ivan Anchors Love, PA-C    cyanocobalamin (,VITAMIN B-12,) 1000 MCG/ML injection Inject 1,000 mcg into the muscle every 30 (thirty) days.   Yes Historical Provider, MD  diazepam (VALIUM) 5 MG tablet Take 5 mg by mouth at bedtime as needed for anxiety.   Yes Historical Provider, MD  metoprolol (LOPRESSOR) 50 MG tablet TAKE 1 TABLET BY MOUTH TWICE A DAY 07/07/16  Yes Thayer Headings, MD  pantoprazole (PROTONIX) 40 MG tablet Take 1 tablet (40 mg total) by mouth daily at 6 (six) AM. Patient taking differently: Take 20 mg by mouth daily at 6 (six) AM.  05/25/16  Yes Kelvin Cellar, MD  pioglitazone (ACTOS) 30 MG tablet Take 30 mg by mouth daily.  Yes Historical Provider, MD  sertraline (ZOLOFT) 100 MG tablet Take 100 mg by mouth daily.  08/21/14  Yes Historical Provider, MD  traMADol (ULTRAM) 50 MG tablet Take 50 mg by mouth 4 (four) times daily as needed for moderate pain.  10/17/16  Yes Historical Provider, MD  fluticasone (FLONASE) 50 MCG/ACT nasal spray Place 1 spray into the nose daily as needed for allergies or rhinitis.     Historical Provider, MD  HYDROcodone-acetaminophen (NORCO/VICODIN) 5-325 MG tablet Take 2 tablets by mouth every 4 (four) hours as needed. Patient not taking: Reported on 10/19/2016 10/13/16   Tanna Furry, MD  HYDROcodone-acetaminophen (NORCO/VICODIN) 5-325 MG tablet Take 2 tablets by mouth every 4 (four) hours as needed. Patient not taking: Reported on 10/19/2016 10/13/16   Tanna Furry, MD  polyethylene glycol powder (GLYCOLAX/MIRALAX) powder Mix 6 cups in 32 ounce of Gatorade and drink once. Then mix 3 cups in 16 ounce of fluid and drink twice a day until you have a bowel movement at least every other day. 10/19/16   Mercy Riding, MD    Family History Family History  Problem Relation Age of Onset  . Thyroid disease Mother     goiter  . Pneumonia Father   . Other      Parents both died of old age; other conditions not known  . Stroke Brother   . Breast cancer Sister     Social History Social  History  Substance Use Topics  . Smoking status: Former Smoker    Packs/day: 0.00    Years: 20.00    Types: Cigarettes  . Smokeless tobacco: Never Used     Comment: 04/06/2013 "quit smoking years ago; didn't smoke that much; probably 20 years; 1ppd"  . Alcohol use No     Allergies   Codeine and Citalopram   Review of Systems Review of Systems  Constitutional: Negative for chills and fever.  HENT: Negative for ear pain and sore throat.   Eyes: Negative for pain and visual disturbance.  Respiratory: Negative for cough and shortness of breath.   Cardiovascular: Negative for chest pain and palpitations.  Gastrointestinal: Positive for constipation. Negative for abdominal pain and vomiting.  Genitourinary: Negative for dysuria and hematuria.  Musculoskeletal: Negative for arthralgias and back pain.  Skin: Negative for color change and rash.  Neurological: Negative for syncope and headaches.  All other systems reviewed and are negative.    Physical Exam Updated Vital Signs BP 117/55   Pulse (!) 59   Temp 98.1 F (36.7 C) (Oral)   Resp 18   SpO2 97%   Physical Exam GEN: elderly male, appears well, no apparent distress. Head: normocephalic and atraumatic  Eyes: conjunctiva without injection, sclera anicteric CVS: RRR, nl s1 & s2, no murmurs, no edema, radial pulses 2+ bilaterally, cap refills < 2 secs RESP: speaks in full sentence, no IWOB, good air movement bilaterally, CTAB GI: BS present & normal, soft, NTND, no guarding, no rebound, no mass MSK: no focal tenderness or notable swelling SKIN: Noted old ecchymosis over his left hip and thigh NEURO: alert and oiented appropriately, no gross defecits  PSYCH: euthymic mood with congruent affect  ED Treatments / Results  Labs (all labs ordered are listed, but only abnormal results are displayed) Labs Reviewed - No data to display  EKG  EKG Interpretation None       Radiology No results  found.  Procedures Procedures (including critical care time)  Medications Ordered in ED Medications  mineral  oil enema 1 enema (1 enema Rectal Given 10/19/16 1139)     Initial Impression / Assessment and Plan / ED Course  I have reviewed the triage vital signs and the nursing notes.  Pertinent labs & imaging results that were available during my care of the patient were reviewed by me and considered in my medical decision making (see chart for details).  Attempted disimpaction and removed three small hard and formed stools. Felt a bigger ball of stool proximally that I couldn't reach. Had small bowel movement after Fleet enema. Gave prescription for MiraLAX with specific instruction for bowel clean out. Recommended follow-up with her primary care doctor and discussed return precautions.   Final Clinical Impressions(s) / ED Diagnoses   Final diagnoses:  Slow transit constipation    New Prescriptions Current Discharge Medication List    START taking these medications   Details  polyethylene glycol powder (GLYCOLAX/MIRALAX) powder Mix 6 cups in 32 ounce of Gatorade and drink once. Then mix 3 cups in 16 ounce of fluid and drink twice a day until you have a bowel movement at least every other day. Qty: 527 g, Refills: 0         Mercy Riding, MD 10/19/16 1247    Leo Grosser, MD 10/20/16 1010

## 2016-10-19 NOTE — ED Notes (Signed)
Patient states he has not had a BM since last Tues. Fell Thurs. Patient was here in the ED Monday for the pain r/t the fall. Patient was given pain medicine then. Wife states she has given him colace and sennakot.

## 2016-10-19 NOTE — ED Notes (Signed)
Patient had small BM post enema.

## 2016-10-19 NOTE — ED Notes (Signed)
Gonfa MD at bedside to preform disimpaction.

## 2016-10-22 DIAGNOSIS — E1122 Type 2 diabetes mellitus with diabetic chronic kidney disease: Secondary | ICD-10-CM | POA: Diagnosis not present

## 2016-10-22 DIAGNOSIS — I5021 Acute systolic (congestive) heart failure: Secondary | ICD-10-CM | POA: Diagnosis not present

## 2016-10-22 DIAGNOSIS — Z6831 Body mass index (BMI) 31.0-31.9, adult: Secondary | ICD-10-CM | POA: Diagnosis not present

## 2016-10-22 DIAGNOSIS — R0901 Asphyxia: Secondary | ICD-10-CM | POA: Diagnosis not present

## 2016-10-22 DIAGNOSIS — K5900 Constipation, unspecified: Secondary | ICD-10-CM | POA: Diagnosis not present

## 2016-10-22 DIAGNOSIS — F419 Anxiety disorder, unspecified: Secondary | ICD-10-CM | POA: Diagnosis not present

## 2016-10-22 DIAGNOSIS — I638 Other cerebral infarction: Secondary | ICD-10-CM | POA: Diagnosis not present

## 2016-10-22 DIAGNOSIS — G4733 Obstructive sleep apnea (adult) (pediatric): Secondary | ICD-10-CM | POA: Diagnosis not present

## 2016-10-22 DIAGNOSIS — I480 Paroxysmal atrial fibrillation: Secondary | ICD-10-CM | POA: Diagnosis not present

## 2016-10-22 DIAGNOSIS — R413 Other amnesia: Secondary | ICD-10-CM | POA: Diagnosis not present

## 2016-10-22 DIAGNOSIS — D62 Acute posthemorrhagic anemia: Secondary | ICD-10-CM | POA: Diagnosis not present

## 2016-10-24 ENCOUNTER — Telehealth: Payer: Self-pay

## 2016-10-24 NOTE — Telephone Encounter (Signed)
Sent notes to scheduling 

## 2016-10-30 DIAGNOSIS — R0901 Asphyxia: Secondary | ICD-10-CM | POA: Diagnosis not present

## 2016-10-30 DIAGNOSIS — I5021 Acute systolic (congestive) heart failure: Secondary | ICD-10-CM | POA: Diagnosis not present

## 2016-10-30 DIAGNOSIS — K5900 Constipation, unspecified: Secondary | ICD-10-CM | POA: Diagnosis not present

## 2016-11-07 DIAGNOSIS — M5416 Radiculopathy, lumbar region: Secondary | ICD-10-CM | POA: Diagnosis not present

## 2016-11-10 ENCOUNTER — Telehealth (HOSPITAL_COMMUNITY): Payer: Self-pay

## 2016-11-10 ENCOUNTER — Other Ambulatory Visit: Payer: Self-pay | Admitting: Physical Medicine and Rehabilitation

## 2016-11-11 ENCOUNTER — Other Ambulatory Visit: Payer: Self-pay | Admitting: Physical Medicine and Rehabilitation

## 2016-11-11 ENCOUNTER — Ambulatory Visit
Admission: RE | Admit: 2016-11-11 | Discharge: 2016-11-11 | Disposition: A | Discharge status: Home / Self Care | Payer: Medicare Other | Source: Ambulatory Visit | Attending: Physical Medicine and Rehabilitation | Admitting: Physical Medicine and Rehabilitation

## 2016-11-11 DIAGNOSIS — M48061 Spinal stenosis, lumbar region without neurogenic claudication: Secondary | ICD-10-CM | POA: Diagnosis not present

## 2016-11-11 DIAGNOSIS — M545 Low back pain: Secondary | ICD-10-CM

## 2016-11-14 ENCOUNTER — Encounter (INDEPENDENT_AMBULATORY_CARE_PROVIDER_SITE_OTHER): Payer: Self-pay

## 2016-11-14 ENCOUNTER — Encounter: Payer: Self-pay | Admitting: Cardiovascular Disease

## 2016-11-14 ENCOUNTER — Ambulatory Visit (INDEPENDENT_AMBULATORY_CARE_PROVIDER_SITE_OTHER): Payer: Medicare Other | Admitting: Cardiovascular Disease

## 2016-11-14 VITALS — BP 136/64 | HR 66 | Ht 75.0 in | Wt 243.8 lb

## 2016-11-14 DIAGNOSIS — I5032 Chronic diastolic (congestive) heart failure: Secondary | ICD-10-CM | POA: Diagnosis not present

## 2016-11-14 DIAGNOSIS — I481 Persistent atrial fibrillation: Secondary | ICD-10-CM

## 2016-11-14 DIAGNOSIS — I4819 Other persistent atrial fibrillation: Secondary | ICD-10-CM

## 2016-11-14 MED ORDER — FUROSEMIDE 40 MG PO TABS: 40.0000 mg | ORAL_TABLET | Freq: Every day | ORAL | 11 refills | Status: DC

## 2016-11-14 MED ORDER — POTASSIUM CHLORIDE ER 10 MEQ PO TBCR: 10.0000 meq | EXTENDED_RELEASE_TABLET | Freq: Every day | ORAL | 11 refills | Status: DC

## 2016-11-14 NOTE — Progress Notes (Signed)
Cardiology Office Note   Date:  11/14/2016   ID:  Richard [name], DOB 05/02/1931, MRN [id number]  PCP:  Tivis Ringer, MD  Cardiologist:   Mertie Moores, MD   Chief Complaint  Patient presents with  . Hypertension  . Atrial Fibrillation    Problem List 1. Atrial fib 2. Essential Hypertension 3. Microcytic anemia  4. Pacer 5, diabetes Mellitus    Jan 17, 2015:  Richard [name] is a 83 y.o. male who presents for  His atrial fib Having lots of problems with anemia.  No blood in stool Being set up to see a hematologist  Still eating salt frequently  Nov. 22, 2016:    Doing well from a cardiac standpoint. Having back pain . , had an injection in his back . Having problems with anemia   February 01, 2016:  Doing great from a cardiac standpoint Is falling frequently .   Is very weak Has been seen by EP - thought not to be related to bradycardia or hypotension .  Possibly dehydration Legs are wobbly  Discussed orthostasis - his symptoms do not sound like orthstasis   Dec. 5, 2017:  Doing well. No CP or dyspnea Still falling on occasion. Fell Thanksgiving night.   [place] off his riding lawnmower yesterday.  Is a poor candidate for chronic anticoagulation.  Is on Eliquis currently.   Was seen by Dr. Rayann Heman, was set up to begin eval for a Watchman.   He was scheduled to have a TEE  But was not able to go because of generalized weakness.   November 14, 2016: Still very unsteady .  Fell several weeks ago , hurt his back  No CP or dyspnea.   Had some shortness of breath on one office visit with [facility].  Was started on Lasix PRN .  Echo in Sept.  - normal EF of 65% .   Sleep with home home O2 with his CPAP.    Past Medical History:  Diagnosis Date  . Anxiety   . Arthritis   . Atrial fibrillation ([place])    a. Dx 03/2013, notes report atrial fibrillation/atrial flutter, placed on amiodarone. NSR in subsequent OV's.  . B12 deficiency anemia   . Chest pain    a. H/o CTA  negative for PE 2012, normal cath 2005, normal nucs previously including 05/2012.  Marland Kitchen Complete heart block (HCC)    a. s/p Medtronic Adapta L model ADDRL 1 (serial number [id number]) pacemaker.  Marland Kitchen GERD (gastroesophageal reflux disease)   . Hiatal hernia   . Hyperlipidemia   . Hypertension   . Iron deficiency anemia   . LBBB (left bundle branch block)   . Microcytic anemia   . Nephrolithiasis   . On home oxygen therapy    a. 2L w/CPAP at night  . On home oxygen therapy    "w/his CPAP at night; don't know how much" (05/21/2016)  . Orthostatic hypotension   . OSA on CPAP   . Rocky Mountain spotted fever ~ 1945  . Sinus drainage   . Skin cancer of lip   . Stroke ([place])   . Type II diabetes mellitus ([place])     Past Surgical History:  Procedure Laterality Date  . CARDIAC CATHETERIZATION     by Dr. Acie Fredrickson, January 24, 2004, that shows minimal coronary artery irregularities and normal left ventricular function  . CATARACT EXTRACTION W/ INTRAOCULAR LENS  IMPLANT, BILATERAL Bilateral   . COLONOSCOPY WITH PROPOFOL N/A 05/23/2016  Procedure: COLONOSCOPY WITH PROPOFOL;  Surgeon: Jerene Bears, MD;  Location: [facility];  Service: Gastroenterology;  Laterality: N/A;  . ENTEROSCOPY N/A 06/02/2016   Procedure: ENTEROSCOPY;  Surgeon: Gatha Mayer, MD;  [facility] ENDOSCOPY;  Service: Endoscopy;  Laterality: N/A;  . ESOPHAGOGASTRODUODENOSCOPY N/A 05/22/2016   Procedure: ESOPHAGOGASTRODUODENOSCOPY (EGD);  Surgeon: Jerene Bears, MD;  Location: [facility] ENDOSCOPY;  Service: Endoscopy;  Laterality: N/A;  . ESOPHAGOGASTRODUODENOSCOPY (EGD) WITH PROPOFOL N/A 06/21/2015   Procedure: ESOPHAGOGASTRODUODENOSCOPY (EGD) WITH PROPOFOL;  Surgeon: Gatha Mayer, MD;  [facility] ENDOSCOPY;  Service: Endoscopy;  Laterality: N/A;  . GIVENS CAPSULE STUDY N/A 05/23/2016   Procedure: GIVENS CAPSULE STUDY;  Surgeon: Jerene Bears, MD;  Location: [facility];  Service: Gastroenterology;  Laterality: N/A;  . HOT HEMOSTASIS N/A  06/02/2016   Procedure: HOT HEMOSTASIS (ARGON PLASMA COAGULATION/BICAP);  Surgeon: Gatha Mayer, MD;  Location[facility];  Service: Endoscopy;  Laterality: N/A;  . INGUINAL HERNIA REPAIR Right   . LUMBAR DISC SURGERY  ~ 1993  . PERMANENT PACEMAKER INSERTION N/A 06/03/2014   MDT Adapta L implanted by Dr Rayann Heman for syncope and transient AV block  . SKIN CANCER EXCISION     "lower lip" (04/06/2013)  . TEMPORARY PACEMAKER INSERTION N/A 05/31/2014   Procedure: TEMPORARY WIRE;  Surgeon: Sinclair Grooms, MD;  Location: [facility] CATH LAB;  Service: Cardiovascular;  Laterality: N/A;  . VASECTOMY     Hx of      Current Outpatient Prescriptions  Medication Sig Dispense Refill  . acetaminophen (TYLENOL) 500 MG tablet Take 500 mg by mouth 2 (two) times daily.     Marland Kitchen amiodarone (PACERONE) 200 MG tablet TAKE 1 TABLET (200 MG TOTAL) BY MOUTH DAILY. 90 tablet 3  . apixaban (ELIQUIS) 5 MG TABS tablet Take 1 tablet (5 mg total) by mouth 2 (two) times daily. 60 tablet 6  . atorvastatin (LIPITOR) 40 MG tablet Take 1 tablet (40 mg total) by mouth daily at 6 PM. 30 tablet 0  . cyanocobalamin (,VITAMIN B-12,) 1000 MCG/ML injection Inject 1,000 mcg into the muscle every 30 (thirty) days.    . diazepam (VALIUM) 5 MG tablet Take 5 mg by mouth at bedtime as needed for anxiety.    . fluticasone (FLONASE) 50 MCG/ACT nasal spray Place 1 spray into the nose daily as needed for allergies or rhinitis.     . metoprolol (LOPRESSOR) 50 MG tablet TAKE 1 TABLET BY MOUTH TWICE A DAY 60 tablet 11  . pantoprazole (PROTONIX) 40 MG tablet Take 1 tablet (40 mg total) by mouth daily at 6 (six) AM. (Patient taking differently: Take 20 mg by mouth daily at 6 (six) AM. ) 30 tablet 1  . sertraline (ZOLOFT) 100 MG tablet Take 100 mg by mouth daily.     . traMADol (ULTRAM) 50 MG tablet Take 50 mg by mouth 4 (four) times daily as needed for moderate pain.      No current facility-administered medications for this visit.     Allergies:    Codeine and Citalopram    Social History:  The patient  reports that he has quit smoking. His smoking use included Cigarettes. He smoked 0.00 packs per day for 20.00 years. He has never used smokeless tobacco. He reports that he does not drink alcohol or use drugs.   Family History:  The patient's family history includes Breast cancer in his sister; Pneumonia in his father; Stroke in his brother; Thyroid disease in his mother.  ROS:  Please see the history of present illness.    Review of Systems: Constitutional:  denies fever, chills, diaphoresis, appetite change and fatigue.  HEENT: denies photophobia, eye pain, redness, hearing loss, ear pain, congestion, sore throat, rhinorrhea, sneezing, neck pain, neck stiffness and tinnitus.  Respiratory: denies SOB, DOE, cough, chest tightness, and wheezing.  Cardiovascular: denies chest pain, palpitations and leg swelling.  Gastrointestinal: denies nausea, vomiting, abdominal pain, diarrhea, constipation, blood in stool.  Genitourinary: denies dysuria, urgency, frequency, hematuria, flank pain and difficulty urinating.  Musculoskeletal: denies  myalgias, back pain, joint swelling, arthralgias and gait problem.   Skin: denies pallor, rash and wound.  Neurological: denies dizziness, seizures, syncope, weakness, light-headedness, numbness and headaches.   Hematological: denies adenopathy, easy bruising, personal or family bleeding history.  Psychiatric/ Behavioral: denies suicidal ideation, mood changes, confusion, nervousness, sleep disturbance and agitation.       All other systems are reviewed and negative.    PHYSICAL EXAM: VS:  BP 136/64 (BP Location: Right Arm, Patient Position: Sitting, Cuff Size: Normal)   Pulse 66   Ht 6\' 3"  (1.905 m)   Wt 243 lb 12.8 oz (110.6 kg)   SpO2 97%   BMI 30.47 kg/m  , BMI Body mass index is 30.47 kg/m. GEN: Well nourished, well developed, in no acute distress  HEENT: normal  Neck: no JVD, carotid  bruits, or masses Cardiac: RRR; no murmurs, rubs, or gallops,no edema  Respiratory:  clear to auscultation bilaterally, normal work of breathing GI: soft, nontender, nondistended, + BS MS: no deformity or atrophy  Skin: warm and dry, no rash Neuro:   Very weak,   Needed assistance getting up on the table  Has lots of back pain .  Psych: normal   EKG:  EKG is not ordered today.   Recent Labs: 05/22/2016: ALT 15 05/24/2016: BUN 7; Creatinine, Ser 1.10; Potassium 3.7; Sodium 139 05/27/2016: Hemoglobin 9.3; Platelets 225.0    Lipid Panel    Component Value Date/Time   CHOL 136 05/06/2016 0531   TRIG 70 05/06/2016 0531   HDL 38 (L) 05/06/2016 0531   CHOLHDL 3.6 05/06/2016 0531   VLDL 14 05/06/2016 0531   LDLCALC 84 05/06/2016 0531      Wt Readings from Last 3 Encounters:  11/14/16 243 lb 12.8 oz (110.6 kg)  10/01/16 263 lb 6.4 oz (119.5 kg)  09/26/16 256 lb (116.1 kg)      Other studies Reviewed: Additional studies/ records that were reviewed today include: . Review of the above records demonstrates:    ASSESSMENT AND PLAN:   1.   Frequent falls.  Mr. Dani is having frequent falling. He states that his legs are very weak and he loses his balance very easily.    2. Atrial fib- his rate is well-controlled. He now has a pacemaker. He is currently on Eliquis.    We had tried to arrange for a watchman procedure but he has not been strong enough to get through the eval. At this point, I would continue with very conservative management.   3. Essential Hypertension - blood pressure is a little on the low side. We'll discontinue the isosorbide.  4. Pacer - followed by Dr. Rayann Heman   5, diabetes Mellitus  - followed by his medical doctor    Current medicines are reviewed at length with the patient today.  The patient does not have concerns regarding medicines.  The following changes have been made:  no change  Labs/ tests ordered today  include:  No orders of the  defined types were placed in this encounter.    Disposition:   FU with me in 6 months      Mertie Moores, MD  11/14/2016 2:29 PM    [address], [place], [place]  [address] Phone: [phone]; Fax: [phone]

## 2016-11-14 NOTE — Patient Instructions (Signed)
Medication Instructions:  START Lasix (Furosemide) 40 mg once daily START Kdur (Potassium) 10 meq once daily   Labwork: Your physician recommends that you return for lab work in: 3 weeks for basic metabolic panel   Testing/Procedures: None Ordered   Follow-Up: Your physician recommends that you schedule a follow-up appointment in: 3 months with Dr. Acie Fredrickson   If you need a refill on your cardiac medications before your next appointment, please call your pharmacy.   Thank you for choosing [facility]! Christen Bame, RN [phone]

## 2016-11-16 DIAGNOSIS — I5032 Chronic diastolic (congestive) heart failure: Secondary | ICD-10-CM | POA: Insufficient documentation

## 2016-11-19 ENCOUNTER — Telehealth: Payer: Self-pay | Admitting: Cardiovascular Disease

## 2016-11-19 NOTE — Telephone Encounter (Signed)
New message     Richard [name] is scheduled next week for blood work she wants to have it done through his PCP , could you caller her with an order and then cancel the appt on 4/12

## 2016-11-20 ENCOUNTER — Telehealth (HOSPITAL_COMMUNITY): Payer: Self-pay

## 2016-11-20 NOTE — Telephone Encounter (Signed)
Left message for Richard [name], Dr. Danna Hefty CMA regarding getting bmet at their office on 4/12. I asked her to call back to confirm.

## 2016-11-20 NOTE — Telephone Encounter (Signed)
Called to schedule consult for compression fracture, left message for pt to return call. AW

## 2016-11-20 NOTE — Telephone Encounter (Signed)
Received call back from Amy at Dr. Danna Hefty office that they will get lab work at patient's appointment and will fax results to [place]. She states they do not need any documentation from Dr. Acie Fredrickson. I thanked her for the call.

## 2016-11-21 ENCOUNTER — Other Ambulatory Visit (HOSPITAL_COMMUNITY): Payer: Self-pay | Admitting: Interventional Radiology

## 2016-11-21 DIAGNOSIS — M4850XA Collapsed vertebra, not elsewhere classified, site unspecified, initial encounter for fracture: Principal | ICD-10-CM

## 2016-11-21 DIAGNOSIS — IMO0001 Reserved for inherently not codable concepts without codable children: Secondary | ICD-10-CM

## 2016-11-25 ENCOUNTER — Ambulatory Visit (HOSPITAL_COMMUNITY)
Admission: RE | Admit: 2016-11-25 | Discharge: 2016-11-25 | Disposition: A | Discharge status: Home / Self Care | Payer: Medicare Other | Source: Ambulatory Visit | Attending: Interventional Radiology | Admitting: Interventional Radiology

## 2016-11-25 ENCOUNTER — Other Ambulatory Visit (HOSPITAL_COMMUNITY): Payer: Self-pay | Admitting: Interventional Radiology

## 2016-11-25 DIAGNOSIS — IMO0001 Reserved for inherently not codable concepts without codable children: Secondary | ICD-10-CM

## 2016-11-25 DIAGNOSIS — M4850XA Collapsed vertebra, not elsewhere classified, site unspecified, initial encounter for fracture: Principal | ICD-10-CM

## 2016-11-25 DIAGNOSIS — M4856XA Collapsed vertebra, not elsewhere classified, lumbar region, initial encounter for fracture: Secondary | ICD-10-CM | POA: Diagnosis not present

## 2016-11-25 DIAGNOSIS — M545 Low back pain: Secondary | ICD-10-CM | POA: Diagnosis not present

## 2016-11-25 HISTORY — PX: IR RADIOLOGIST EVAL & MGMT: IMG5224

## 2016-11-26 ENCOUNTER — Encounter (HOSPITAL_COMMUNITY): Payer: Self-pay | Admitting: Interventional Radiology

## 2016-11-26 ENCOUNTER — Other Ambulatory Visit: Payer: Self-pay | Admitting: Radiology

## 2016-11-27 ENCOUNTER — Ambulatory Visit (HOSPITAL_COMMUNITY)
Admission: RE | Admit: 2016-11-27 | Discharge: 2016-11-27 | Disposition: A | Discharge status: Home / Self Care | Payer: Medicare Other | Source: Ambulatory Visit | Attending: Interventional Radiology | Admitting: Interventional Radiology

## 2016-11-27 ENCOUNTER — Other Ambulatory Visit: Payer: Self-pay | Admitting: Radiology

## 2016-11-27 ENCOUNTER — Encounter (HOSPITAL_COMMUNITY): Payer: Self-pay

## 2016-11-27 DIAGNOSIS — IMO0001 Reserved for inherently not codable concepts without codable children: Secondary | ICD-10-CM

## 2016-11-27 DIAGNOSIS — I447 Left bundle-branch block, unspecified: Secondary | ICD-10-CM | POA: Insufficient documentation

## 2016-11-27 DIAGNOSIS — Z8673 Personal history of transient ischemic attack (TIA), and cerebral infarction without residual deficits: Secondary | ICD-10-CM | POA: Diagnosis not present

## 2016-11-27 DIAGNOSIS — Y92009 Unspecified place in unspecified non-institutional (private) residence as the place of occurrence of the external cause: Secondary | ICD-10-CM | POA: Insufficient documentation

## 2016-11-27 DIAGNOSIS — I4891 Unspecified atrial fibrillation: Secondary | ICD-10-CM | POA: Insufficient documentation

## 2016-11-27 DIAGNOSIS — F419 Anxiety disorder, unspecified: Secondary | ICD-10-CM | POA: Insufficient documentation

## 2016-11-27 DIAGNOSIS — K449 Diaphragmatic hernia without obstruction or gangrene: Secondary | ICD-10-CM | POA: Insufficient documentation

## 2016-11-27 DIAGNOSIS — K219 Gastro-esophageal reflux disease without esophagitis: Secondary | ICD-10-CM | POA: Insufficient documentation

## 2016-11-27 DIAGNOSIS — Z9981 Dependence on supplemental oxygen: Secondary | ICD-10-CM | POA: Diagnosis not present

## 2016-11-27 DIAGNOSIS — E119 Type 2 diabetes mellitus without complications: Secondary | ICD-10-CM | POA: Diagnosis not present

## 2016-11-27 DIAGNOSIS — Z7901 Long term (current) use of anticoagulants: Secondary | ICD-10-CM | POA: Diagnosis not present

## 2016-11-27 DIAGNOSIS — I10 Essential (primary) hypertension: Secondary | ICD-10-CM | POA: Insufficient documentation

## 2016-11-27 DIAGNOSIS — M1990 Unspecified osteoarthritis, unspecified site: Secondary | ICD-10-CM | POA: Insufficient documentation

## 2016-11-27 DIAGNOSIS — I4892 Unspecified atrial flutter: Secondary | ICD-10-CM | POA: Diagnosis not present

## 2016-11-27 DIAGNOSIS — G4733 Obstructive sleep apnea (adult) (pediatric): Secondary | ICD-10-CM | POA: Insufficient documentation

## 2016-11-27 DIAGNOSIS — Z7951 Long term (current) use of inhaled steroids: Secondary | ICD-10-CM | POA: Diagnosis not present

## 2016-11-27 DIAGNOSIS — S32019A Unspecified fracture of first lumbar vertebra, initial encounter for closed fracture: Secondary | ICD-10-CM | POA: Diagnosis not present

## 2016-11-27 DIAGNOSIS — Z5309 Procedure and treatment not carried out because of other contraindication: Secondary | ICD-10-CM | POA: Diagnosis not present

## 2016-11-27 DIAGNOSIS — M4850XA Collapsed vertebra, not elsewhere classified, site unspecified, initial encounter for fracture: Secondary | ICD-10-CM

## 2016-11-27 DIAGNOSIS — W19XXXA Unspecified fall, initial encounter: Secondary | ICD-10-CM | POA: Diagnosis not present

## 2016-11-27 DIAGNOSIS — E785 Hyperlipidemia, unspecified: Secondary | ICD-10-CM | POA: Insufficient documentation

## 2016-11-27 DIAGNOSIS — M545 Low back pain: Secondary | ICD-10-CM | POA: Diagnosis not present

## 2016-11-27 DIAGNOSIS — Z87891 Personal history of nicotine dependence: Secondary | ICD-10-CM | POA: Insufficient documentation

## 2016-11-27 DIAGNOSIS — S32029A Unspecified fracture of second lumbar vertebra, initial encounter for closed fracture: Secondary | ICD-10-CM | POA: Diagnosis not present

## 2016-11-27 DIAGNOSIS — D519 Vitamin B12 deficiency anemia, unspecified: Secondary | ICD-10-CM | POA: Insufficient documentation

## 2016-11-27 DIAGNOSIS — M4856XA Collapsed vertebra, not elsewhere classified, lumbar region, initial encounter for fracture: Secondary | ICD-10-CM | POA: Diagnosis not present

## 2016-11-27 DIAGNOSIS — Z950 Presence of cardiac pacemaker: Secondary | ICD-10-CM | POA: Insufficient documentation

## 2016-11-27 LAB — APTT: aPTT: 35 seconds (ref 24–36)

## 2016-11-27 LAB — CBC
HCT: 34.4 % — ABNORMAL LOW (ref 39.0–52.0)
Hemoglobin: 10.6 g/dL — ABNORMAL LOW (ref 13.0–17.0)
MCH: 27.9 pg (ref 26.0–34.0)
MCHC: 30.8 g/dL (ref 30.0–36.0)
MCV: 90.5 fL (ref 78.0–100.0)
PLATELETS: 214 10*3/uL (ref 150–400)
RBC: 3.8 MIL/uL — ABNORMAL LOW (ref 4.22–5.81)
RDW: 18.6 % — ABNORMAL HIGH (ref 11.5–15.5)
WBC: 9.4 10*3/uL (ref 4.0–10.5)

## 2016-11-27 LAB — BASIC METABOLIC PANEL
Anion gap: 14 (ref 5–15)
BUN: 17 mg/dL (ref 6–20)
CALCIUM: 9.2 mg/dL (ref 8.9–10.3)
CO2: 28 mmol/L (ref 22–32)
CREATININE: 1.2 mg/dL (ref 0.61–1.24)
Chloride: 94 mmol/L — ABNORMAL LOW (ref 101–111)
GFR calc Af Amer: >60 mL/min (ref >60)
GFR calc non Af Amer: 54 mL/min — ABNORMAL LOW (ref >60)
GLUCOSE: 184 mg/dL — AB (ref 65–99)
Potassium: 4 mmol/L (ref 3.5–5.1)
Sodium: 136 mmol/L (ref 135–145)

## 2016-11-27 LAB — PROTIME-INR
INR: 1.23
PROTHROMBIN TIME: 15.6 s — AB (ref 11.4–15.2)

## 2016-11-27 MED ORDER — SODIUM CHLORIDE 0.9 % IV SOLN: INTRAVENOUS | Status: DC

## 2016-11-27 MED ORDER — SODIUM CHLORIDE 0.9 % IV SOLN: INTRAVENOUS | Status: AC

## 2016-11-27 MED ORDER — IOPAMIDOL (ISOVUE-300) INJECTION 61%
INTRAVENOUS | Status: AC
  Filled 2016-11-27: qty 50

## 2016-11-27 MED ORDER — GELATIN ABSORBABLE 12-7 MM EX MISC
CUTANEOUS | Status: AC
  Filled 2016-11-27: qty 1

## 2016-11-27 MED ORDER — BUPIVACAINE HCL (PF) 0.25 % IJ SOLN
INTRAMUSCULAR | Status: AC
  Filled 2016-11-27: qty 30

## 2016-11-27 MED ORDER — CEFAZOLIN SODIUM-DEXTROSE 2-4 GM/100ML-% IV SOLN: 2.0000 g | INTRAVENOUS | Status: DC

## 2016-11-27 MED ORDER — TOBRAMYCIN SULFATE 1.2 G IJ SOLR
INTRAMUSCULAR | Status: AC
  Filled 2016-11-27: qty 1.2

## 2016-11-27 NOTE — Progress Notes (Signed)
Patient ID: Richard [name], male   DOB: Jul 06, 1931, 83 y.o.   MRN: [id number]   Pt is scheduled today for Lumbar 1 and 2 KP/VP  Last dose Eliquis 4/3 Need off 48 hrs  Will reschedule to 4/6 am  Pt and wife aware and agreeable Continue to HOLD Eliquis

## 2016-11-27 NOTE — Progress Notes (Signed)
Procedure cancelled per Dr. Estanislado Pandy. Pt D/C home in wheelchair with wife. Awake and alert. In no distress.

## 2016-11-27 NOTE — H&P (Signed)
Chief Complaint: Patient was seen in consultation today for Lumbar 1 and 2 vertebroplasty/kyphoplasty at the request of Dr Mickel Duhamel  Referring Physician(s): Dr Mickel Duhamel  Supervising Physician: Luanne Bras  Patient Status: [facility] - Out-pt  History of Present Illness: Richard [name] is a 83 y.o. male   Pt fell at home 09/2016 Has had back pain since then Medication with little relief 8/10 pain scale  CT 11/11/16: IMPRESSION: 1. Acute/subacute compression fractures involving the superior endplates of L1 and L2. Associated height loss of up 55% at the L1 level, and 35% at the L2 level. Mild 2-3 mm bony retropulsion without significant canal stenosis. If there is a clinical desired to potentially perform vertebral augmentation, further evaluation with dedicated MRI could be performed to further establish acuity. 2. Moderate multilevel degenerative spondylolysis and facet arthrosis as detailed above. Please see above report for a full description of these findings. 3. Sequela of remote decompressive right hemi laminectomy at L2 and L3. 4. Moderate aorto bi-iliac atherosclerotic disease  Was referred to Dr Estanislado Pandy for evaluation and treatment Now scheduled for L1 and 2 KP/VP LD Eliquis 4/3   Past Medical History:  Diagnosis Date  . Anxiety   . Arthritis   . Atrial fibrillation ([place])    a. Dx 03/2013, notes report atrial fibrillation/atrial flutter, placed on amiodarone. NSR in subsequent OV's.  . B12 deficiency anemia   . Chest pain    a. H/o CTA negative for PE 2012, normal cath 2005, normal nucs previously including 05/2012.  Marland Kitchen Complete heart block (HCC)    a. s/p Medtronic Adapta L model ADDRL 1 (serial number [id number]) pacemaker.  Marland Kitchen GERD (gastroesophageal reflux disease)   . Hiatal hernia   . Hyperlipidemia   . Hypertension   . Iron deficiency anemia   . LBBB (left bundle branch block)   . Microcytic anemia   . Nephrolithiasis   . On home oxygen  therapy    a. 2L w/CPAP at night  . On home oxygen therapy    "w/his CPAP at night; don't know how much" (05/21/2016)  . Orthostatic hypotension   . OSA on CPAP   . [place] spotted fever ~ 1945  . Sinus drainage   . Skin cancer of lip   . Stroke ([place])   . Type II diabetes mellitus ([place])     Past Surgical History:  Procedure Laterality Date  . CARDIAC CATHETERIZATION     by Dr. Acie Fredrickson, January 24, 2004, that shows minimal coronary artery irregularities and normal left ventricular function  . CATARACT EXTRACTION W/ INTRAOCULAR LENS  IMPLANT, BILATERAL Bilateral   . COLONOSCOPY WITH PROPOFOL N/A 05/23/2016   Procedure: COLONOSCOPY WITH PROPOFOL;  Surgeon: Jerene Bears, MD;  Location: [place];  Service: Gastroenterology;  Laterality: N/A;  . ENTEROSCOPY N/A 06/02/2016   Procedure: ENTEROSCOPY;  Surgeon: Gatha Mayer, MD;  Location: [facility] ENDOSCOPY;  Service: Endoscopy;  Laterality: N/A;  . ESOPHAGOGASTRODUODENOSCOPY N/A 05/22/2016   Procedure: ESOPHAGOGASTRODUODENOSCOPY (EGD);  Surgeon: Jerene Bears, MD;  Location: [facility] ENDOSCOPY;  Service: Endoscopy;  Laterality: N/A;  . ESOPHAGOGASTRODUODENOSCOPY (EGD) WITH PROPOFOL N/A 06/21/2015   Procedure: ESOPHAGOGASTRODUODENOSCOPY (EGD) WITH PROPOFOL;  Surgeon: Gatha Mayer, MD;  Location: [facility] ENDOSCOPY;  Service: Endoscopy;  Laterality: N/A;  . GIVENS CAPSULE STUDY N/A 05/23/2016   Procedure: GIVENS CAPSULE STUDY;  Surgeon: Jerene Bears, MD;  Location: [place];  Service: Gastroenterology;  Laterality: N/A;  . HOT HEMOSTASIS N/A 06/02/2016  Procedure: HOT HEMOSTASIS (ARGON PLASMA COAGULATION/BICAP);  Surgeon: Gatha Mayer, MD;  Location: [facility];  Service: Endoscopy;  Laterality: N/A;  . INGUINAL HERNIA REPAIR Right   . IR RADIOLOGIST EVAL & MGMT  11/25/2016  . LUMBAR DISC SURGERY  ~ 1993  . PERMANENT PACEMAKER INSERTION N/A 06/03/2014   MDT Adapta L implanted by Dr Rayann Heman for syncope and transient AV block  . SKIN CANCER EXCISION       "lower lip" (04/06/2013)  . TEMPORARY PACEMAKER INSERTION N/A 05/31/2014   Procedure: TEMPORARY WIRE;  Surgeon: Sinclair Grooms, MD;  Location: [facility] CATH LAB;  Service: Cardiovascular;  Laterality: N/A;  . VASECTOMY     Hx of     Allergies: Codeine and Citalopram  Medications: Prior to Admission medications   Medication Sig Start Date End Date Taking? Authorizing Provider  acetaminophen (TYLENOL) 500 MG tablet Take 500-1,000 mg by mouth 2 (two) times daily.    Yes Historical Provider, MD  amiodarone (PACERONE) 200 MG tablet TAKE 1 TABLET (200 MG TOTAL) BY MOUTH DAILY. Patient taking differently: TAKE 1 TABLET (200 MG TOTAL) BY MOUTH ON MON, WED, AND FRI 10/03/16  Yes Thayer Headings, MD  apixaban (ELIQUIS) 5 MG TABS tablet Take 1 tablet (5 mg total) by mouth 2 (two) times daily. 06/06/16  Yes Thayer Headings, MD  atorvastatin (LIPITOR) 40 MG tablet Take 1 tablet (40 mg total) by mouth daily at 6 PM. 05/16/16  Yes Ivan Anchors Love, PA-C  cyanocobalamin (,VITAMIN B-12,) 1000 MCG/ML injection Inject 1,000 mcg into the muscle every 30 (thirty) days.   Yes Historical Provider, MD  diazepam (VALIUM) 5 MG tablet Take 5 mg by mouth at bedtime.    Yes Historical Provider, MD  furosemide (LASIX) 40 MG tablet Take 1 tablet (40 mg total) by mouth daily. 11/14/16 02/12/17 Yes Thayer Headings, MD  Menthol, Topical Analgesic, (ABSORBINE PAIN RELIEVING EX) Apply 1 application topically daily as needed (muscle pain).   Yes Historical Provider, MD  metoprolol (LOPRESSOR) 50 MG tablet TAKE 1 TABLET BY MOUTH TWICE A DAY 07/07/16  Yes Thayer Headings, MD  pantoprazole (PROTONIX) 40 MG tablet Take 1 tablet (40 mg total) by mouth daily at 6 (six) AM. Patient taking differently: Take 20 mg by mouth daily at 6 (six) AM.  05/25/16  Yes Kelvin Cellar, MD  potassium chloride (K-DUR) 10 MEQ tablet Take 1 tablet (10 mEq total) by mouth daily. 11/14/16 02/12/17 Yes Thayer Headings, MD  sertraline (ZOLOFT) 100 MG tablet Take 100  mg by mouth daily.  08/21/14  Yes Historical Provider, MD  traMADol (ULTRAM) 50 MG tablet Take 50 mg by mouth 4 (four) times daily as needed for moderate pain.  10/17/16  Yes Historical Provider, MD  fluticasone (FLONASE) 50 MCG/ACT nasal spray Place 1 spray into the nose daily as needed for allergies or rhinitis.     Historical Provider, MD     Family History  Problem Relation Age of Onset  . Thyroid disease Mother     goiter  . Pneumonia Father   . Other      Parents both died of old age; other conditions not known  . Stroke Brother   . Breast cancer Sister     Social History   Social History  . Marital status: Married    Spouse name: N/A  . Number of children: 2  . Years of education: N/A   Occupational History  . retired  Social History Main Topics  . Smoking status: Former Smoker    Packs/day: 0.00    Years: 20.00    Types: Cigarettes  . Smokeless tobacco: Never Used     Comment: 04/06/2013 "quit smoking years ago; didn't smoke that much; probably 20 years; 1ppd"  . Alcohol use No  . Drug use: No  . Sexual activity: No   Other Topics Concern  . None   Social History Narrative  . None    Review of Systems: A 12 point ROS discussed and pertinent positives are indicated in the HPI above.  All other systems are negative.  Review of Systems  Constitutional: Positive for activity change. Negative for appetite change, fatigue, fever and unexpected weight change.  HENT: Positive for hearing loss.   Respiratory: Negative for cough and shortness of breath.   Cardiovascular: Negative for chest pain.  Gastrointestinal: Negative for abdominal pain.  Musculoskeletal: Positive for back pain and gait problem.  Neurological: Positive for weakness.  Psychiatric/Behavioral: Negative for behavioral problems and confusion.    Vital Signs: BP 134/70   Pulse 66   Temp 97.9 F (36.6 C) (Oral)   Resp 18   Ht 6\' 3"  (1.905 m)   Wt 257 lb (116.6 kg)   SpO2 95%   BMI 32.12  kg/m   Physical Exam  Constitutional: He is oriented to person, place, and time.  Cardiovascular: Normal rate, regular rhythm and normal heart sounds.   Pulmonary/Chest: Effort normal and breath sounds normal. He has no wheezes.  Abdominal: Soft. Bowel sounds are normal. There is no tenderness.  Musculoskeletal: Normal range of motion.  Low back pain  Neurological: He is alert and oriented to person, place, and time.  Skin: Skin is warm and dry.  Psychiatric: He has a normal mood and affect. His behavior is normal. Judgment and thought content normal.  Nursing note and vitals reviewed.   Mallampati Score:  MD Evaluation Airway: WNL Heart: WNL Abdomen: WNL Chest/ Lungs: WNL ASA  Classification: 3 Mallampati/Airway Score: One  Imaging: Ct Lumbar Spine Wo Contrast  Result Date: 11/11/2016 CLINICAL DATA:  Initial evaluation for compression fractures. Low back pain. History of prior lumbar surgery. EXAM: CT LUMBAR SPINE WITHOUT CONTRAST TECHNIQUE: Multidetector CT imaging of the lumbar spine was performed without intravenous contrast administration. Multiplanar CT image reconstructions were also generated. COMPARISON:  Prior radiograph from 10/13/2016. FINDINGS: Segmentation: Normal segmentation. Lowest well-formed disc is labeled the L5-S1 level. Alignment: Straightening of the normal lumbar lordosis. No listhesis. Vertebrae: There is an acute/ subacute compression fracture involving the superior endplate of L1. Associated height loss of up to 55% with 3 mm bony retropulsion. Additional acute/subacute compression fracture involving the superior endplate of L2. Associated height loss of up to 35% with 2-3 mm bony retropulsion. Vertebral body heights otherwise maintained. No other fracture. No focal osseous lesions. Paraspinal and other soft tissues: Paraspinous soft tissues demonstrate no acute abnormality. Fatty atrophy noted within the lower paraspinous musculature. Moderate aorto bi-iliac  atherosclerotic disease. Few scattered renal cysts noted. Disc levels: T12-L1: 3 mm bony retropulsion related to the L1 superior endplate fracture. Mild facet hypertrophy. No significant stenosis. L1-2: 2-3 mm bony retropulsion related to the superior endplate L2 fracture. Mild diffuse disc bulge. Facet arthrosis. No significant canal or foraminal stenosis. L2-3: Diffuse degenerative discs bulge. Bilateral facet arthrosis. Patient is status post decompressive right hemi laminectomy. Release mild left lateral recess stenosis present. No significant canal narrowing. Foramina remain widely patent. L3-4: Diffuse degenerative disc bulge  with intervertebral disc space narrowing. Patient is status post remote right decompressive laminectomy. Right greater than left facet arthrosis. Moderate to severe right with mild to moderate left lateral recess stenosis. Moderate canal narrowing. Mild bilateral foraminal narrowing related to endplate disc osteophyte and facet disease, slightly worse on the right. L4-5: Diffuse degenerative disc bulge with intervertebral disc space narrowing. Associated annular calcification present posteriorly. Bilateral facet arthrosis. Probable mild to moderate bilateral lateral recess stenosis. No significant canal narrowing. Foramina widely patent. L5-S1: Diffuse degenerative disc bulge with disc desiccation. Prominent bulky osteophytic spurring present at the right anterolateral aspect of the L5-S1 disc space. Mild bilateral facet arthrosis. No significant canal stenosis. Mild bilateral foraminal narrowing. IMPRESSION: 1. Acute/subacute compression fractures involving the superior endplates of L1 and L2. Associated height loss of up 55% at the L1 level, and 35% at the L2 level. Mild 2-3 mm bony retropulsion without significant canal stenosis. If there is a clinical desired to potentially perform vertebral augmentation, further evaluation with dedicated MRI could be performed to further establish  acuity. 2. Moderate multilevel degenerative spondylolysis and facet arthrosis as detailed above. Please see above report for a full description of these findings. 3. Sequela of remote decompressive right hemi laminectomy at L2 and L3. 4. Moderate aorto bi-iliac atherosclerotic disease. Electronically Signed   By: Jeannine Boga M.D.   On: 11/11/2016 14:25   Ir Radiologist Eval & Mgmt  Result Date: 11/26/2016 EXAM: NEW PATIENT OFFICE VISIT CHIEF COMPLAINT: Severe low back pain secondary to compression fractures at L1 and L2. Current Pain Level: 1-10 HISTORY OF PRESENT ILLNESS: The patient is an 83 year old gentleman who has been referred for evaluation for treatment of severe low back pain secondary to compression fracture at L1 and L2. The patient is accompanied by his wife. The patient reports having developed an acute onset of severe low back pain following a fall in February of 2018. Since that time the pain has been excruciating and gradually getting worse. At the present time, the pain is constant being an 8 out of 10 after pain medications. The patient reports being almost completely debilitated because of the pain induced by these fractures. At the present time, the patient is dependent upon changing clothes and also some of the daily routine activities. Prior to this the patient was active, driving and actively engaged in the activities at home and outside. The patient describes this pain as being severe in the lumbar region with circumferential radiation into the hips. There is no radiation in a radicular manner into either of the legs. The patient denies any autonomic dysfunction of his bowel or bladder activities. The wife, however, states the patient had to be admitted into the hospital because of chronic constipation recently on account of the pain killers that were prescribed initially. Patient ambulates with a walker. The pain is worsened with him standing for long periods of time or stooping  or turning. Going to bed is extremely uncomfortable because of the turning motion. However, there is some relief with the patient being flat on his back. He denies recent chills, fever or rigors. He denies any symptoms of dysuria, hematuria or of frequency of micturition. His appetite is somewhat subdued and his weight accordingly decreased. The wife attributes this partially due to the patient having undergone diuresis recently with more intake of diuretics. Past Medical History: Depression, diabetes, high blood pressure, heart problems requiring a pacemaker and history of stroke in the past year. Surgical History: Degenerative lumbar disc disease, lumbar  radiculitis. Pain in the limb. Hernia repair, back surgery in 1992 and 1993, vasectomy and pacemaker placement in 2016. Medications: Sertraline, hydrochlorothiazide, K-dur orally, metoprolol, Eliquis, carvedilol, omeprazole, fluticasone propionate, diazepam, benazepril, amlodipine, ploglitazone, tramadol as needed. Allergies: Codeine causes hallucinations. Citalopram causes generalized rash and itching. Social History: Married lives with his wife. One son and one daughter alive and well. Patient denies drinking alcohol or use of cigarettes or illicit chemicals. Patient is on a diabetic diet. Family History: Mother deceased at age 76 lung issues. Father deceased age 59 heart disease, sister disease at age 27 carcinoma the breast, one sister deceased at birth, brother deceased age 70 of polio. REVIEW OF SYSTEMS: Negative for pathologic symptomatology unless as mentioned above. PHYSICAL EXAMINATION: Patient in obvious significant pain with difficulty turning or moving or speaking. Affect somewhat subdued appropriate to his clinical situation. Neurologically grossly nonlateralizing cranial nerve, motor, sensory or coordination difficulties. Station and gait not tested. The patient exhibited vague modest to moderate severe tenderness in the upper lumbar region.  ASSESSMENT AND PLAN: The patient's recent CT scan of the lumbosacral spine was reviewed. Again demonstrated is the compression deformity at L1 associated with mild 2-3 mm of retropulsion into the spinal canal. The L2 vertebral body also demonstrates a superior endplate compression though anteriorly associated with an osteophyte. There is also mild decrease of the posterior half of the vertebral body height at L5. Extensive degenerative facet arthropathy is noted at multiple levels most notably at L3-L4 on the right side with associated laminectomy defect. The option of vertebral body augmentation at L1 and L2 given the patient's severe debilitating pain. The procedure, the risks, the benefits, the alternatives were all reviewed in detail. Complications such as potential infection, leakage of the methylmethacrylate mixture and bleeding were all reviewed extensively in detail with both of them. The patient would like to proceed with treatment because of severe debilitating pain. They were informed that either a vertebroplasty or a balloon kyphoplasty would be performed at both or either of these levels depending on the imaging findings at the time of treatment. The patient would have to stop Eliquis for least 48 hours prior to the procedure. The patient's wife reports stoppage of Eliquis for at least 48 hours for joint injections with the referring physician. The patient's wife reports blood in his stool of a significant degree a few months ago. The patient had an extensive GI workup which revealed no obvious source. The patient, therefore, would not be started on aspirin during this time. They are fully aware that the patient would be left unprotected for 48 hours following stoppage of the Eliquis. The patient was asked to call should he have any concerns or questions. They both leave with good understanding and agreement with the above management plan. Electronically Signed   By: Luanne Bras M.D.   On:  11/25/2016 21:16    Labs:  CBC:  Recent Labs  05/23/16 0530 05/24/16 0624 05/27/16 1356 11/27/16 0715  WBC 4.4 4.5 7.6 9.4  HGB 8.4* 8.7* 9.3* 10.6*  HCT 26.9* 28.6* 28.0* 34.4*  PLT 155 165 225.0 214    COAGS:  Recent Labs  05/05/16 1634 05/21/16 1226 11/27/16 0715  INR 1.22 1.32 1.23  APTT 35 34 35    BMP:  Recent Labs  05/22/16 0540 05/23/16 0530 05/24/16 0624 11/27/16 0715  NA 140 139 139 136  K 3.9 3.8 3.7 4.0  CL 107 105 105 94*  CO2 25 28 29 28   GLUCOSE 122*  152* 142* 184*  BUN 15 8 7 17   CALCIUM 8.4* 8.8* 8.7* 9.2  CREATININE 1.08 1.03 1.10 1.20  GFRNONAA >60 >60 >60 54*  GFRAA >60 >60 >60 >60    LIVER FUNCTION TESTS:  Recent Labs  05/05/16 1035 05/09/16 0928 05/21/16 1226 05/22/16 0540  BILITOT 0.6 0.6 0.5 0.9  AST 32 41 21 20  ALT 22 29 15* 15*  ALKPHOS 77 94 65 59  PROT 7.3 7.7 6.1* 6.0*  ALBUMIN 3.5 3.6 3.2* 3.0*    TUMOR MARKERS: No results for input(s): AFPTM, CEA, CA199, CHROMGRNA in the last 8760 hours.  Assessment and Plan:  Lumbar 1 and 2 painful compression fractures Now scheduled for vertebroplasty/kyphoplasty Risks and Benefits discussed with the patient including, but not limited to education regarding the natural healing process of compression fractures without intervention, bleeding, infection, cement migration which may cause spinal cord damage, paralysis, pulmonary embolism or even death. All of the patient's questions were answered, patient is agreeable to proceed. Consent signed and in chart.  Thank you for this interesting consult.  I greatly enjoyed meeting Richard [name] and look forward to participating in their care.  A copy of this report was sent to the requesting provider on this date.  Electronically Signed: Andie Mungin A 11/27/2016, 8:17 AM   I spent a total of  30 Minutes   in face to face in clinical consultation, greater than 50% of which was counseling/coordinating care for L1/2  VP/KP

## 2016-11-28 ENCOUNTER — Other Ambulatory Visit (HOSPITAL_COMMUNITY): Payer: Self-pay | Admitting: Interventional Radiology

## 2016-11-28 ENCOUNTER — Ambulatory Visit (HOSPITAL_COMMUNITY)
Admission: RE | Admit: 2016-11-28 | Discharge: 2016-11-28 | Disposition: A | Discharge status: Home / Self Care | Payer: Medicare Other | Source: Ambulatory Visit | Attending: Interventional Radiology | Admitting: Interventional Radiology

## 2016-11-28 DIAGNOSIS — M4856XA Collapsed vertebra, not elsewhere classified, lumbar region, initial encounter for fracture: Secondary | ICD-10-CM | POA: Insufficient documentation

## 2016-11-28 DIAGNOSIS — M4850XA Collapsed vertebra, not elsewhere classified, site unspecified, initial encounter for fracture: Principal | ICD-10-CM

## 2016-11-28 DIAGNOSIS — M545 Low back pain: Secondary | ICD-10-CM | POA: Diagnosis not present

## 2016-11-28 DIAGNOSIS — IMO0001 Reserved for inherently not codable concepts without codable children: Secondary | ICD-10-CM

## 2016-11-28 HISTORY — PX: IR VERTEBROPLASTY LUMBAR BX INC UNI/BIL INC/INJECT/IMAGING: IMG5516

## 2016-11-28 HISTORY — PX: IR VERTEBROPLASTY EA ADDL (T&LS) BX INC UNI/BIL INC INJECT/IMAGING: IMG5517

## 2016-11-28 MED ORDER — HYDROMORPHONE HCL 1 MG/ML IJ SOLN
INTRAMUSCULAR | Status: AC
  Filled 2016-11-28: qty 1

## 2016-11-28 MED ORDER — GELATIN ABSORBABLE 12-7 MM EX MISC
CUTANEOUS | Status: AC | PRN
  Administered 2016-11-28: 1

## 2016-11-28 MED ORDER — GELATIN ABSORBABLE 12-7 MM EX MISC
CUTANEOUS | Status: AC
Start: 2016-11-28 — End: 2016-11-28
  Filled 2016-11-28: qty 1

## 2016-11-28 MED ORDER — TOBRAMYCIN SULFATE 1.2 G IJ SOLR
INTRAMUSCULAR | Status: AC
  Filled 2016-11-28: qty 1.2

## 2016-11-28 MED ORDER — TOBRAMYCIN SULFATE 1.2 G IJ SOLR
INTRAMUSCULAR | Status: AC | PRN
  Administered 2016-11-28: 1.2 g via TOPICAL

## 2016-11-28 MED ORDER — MIDAZOLAM HCL 2 MG/2ML IJ SOLN
INTRAMUSCULAR | Status: AC
Start: 2016-11-28 — End: 2016-11-28
  Filled 2016-11-28: qty 6

## 2016-11-28 MED ORDER — BUPIVACAINE HCL (PF) 0.25 % IJ SOLN
INTRAMUSCULAR | Status: AC
Start: 2016-11-28 — End: 2016-11-28
  Filled 2016-11-28: qty 30

## 2016-11-28 MED ORDER — SODIUM CHLORIDE 0.9 % IV SOLN: INTRAVENOUS | Status: DC

## 2016-11-28 MED ORDER — BUPIVACAINE HCL (PF) 0.25 % IJ SOLN
INTRAMUSCULAR | Status: AC | PRN
  Administered 2016-11-28: 20 mL

## 2016-11-28 MED ORDER — SODIUM CHLORIDE 0.9 % IV SOLN: INTRAVENOUS | Status: AC

## 2016-11-28 MED ORDER — FENTANYL CITRATE (PF) 100 MCG/2ML IJ SOLN
INTRAMUSCULAR | Status: AC
  Filled 2016-11-28: qty 4

## 2016-11-28 MED ORDER — FENTANYL CITRATE (PF) 100 MCG/2ML IJ SOLN
INTRAMUSCULAR | Status: AC | PRN
  Administered 2016-11-28 (×3): 25 ug via INTRAVENOUS

## 2016-11-28 MED ORDER — MIDAZOLAM HCL 2 MG/2ML IJ SOLN
INTRAMUSCULAR | Status: AC | PRN
  Administered 2016-11-28 (×3): 1 mg via INTRAVENOUS

## 2016-11-28 MED ORDER — IOPAMIDOL (ISOVUE-300) INJECTION 61%
INTRAVENOUS | Status: AC
Start: 2016-11-28 — End: 2016-11-28
  Administered 2016-11-28: 10 mL
  Filled 2016-11-28: qty 50

## 2016-11-28 MED ORDER — CEFAZOLIN SODIUM-DEXTROSE 2-4 GM/100ML-% IV SOLN
INTRAVENOUS | Status: AC
  Administered 2016-11-28: 09:00:00
  Filled 2016-11-28: qty 100

## 2016-11-28 MED ORDER — INSULIN ASPART 100 UNIT/ML ~~LOC~~ SOLN: 0.0000 [IU] | Freq: Three times a day (TID) | SUBCUTANEOUS | Status: DC

## 2016-11-28 NOTE — Progress Notes (Signed)
Patient ID: Richard [name], male   DOB: 1931/05/01, 83 y.o.   MRN: [id number]  Pt has returned today for L1 and L2 VP/KP LD Eliquis 4/3  Risks and Benefits discussed with the patient including, but not limited to education regarding the natural healing process of compression fractures without intervention, bleeding, infection, cement migration which may cause spinal cord damage, paralysis, pulmonary embolism or even death. All of the patient's questions were answered, patient is agreeable to proceed. Consent signed and in chart.

## 2016-11-28 NOTE — Sedation Documentation (Signed)
Patient is resting comfortably. 

## 2016-11-28 NOTE — Procedures (Signed)
S/P L1 and L2 VP

## 2016-11-28 NOTE — Discharge Instructions (Signed)
KYPHOPLASTY/VERTEBROPLASTY DISCHARGE INSTRUCTIONS  Medications: (check all that apply)     Resume all home medications as before procedure.                  Continue your pain medications as prescribed as needed.  Over the next 3-5 days, decrease your pain medication as tolerated.  Over the counter medications (i.e. Tylenol, ibuprofen, and aleve) may be substituted once severe/moderate pain symptoms have subsided.   Wound Care: - Bandages may be removed the day following your procedure.  You may get your incision wet once bandages are removed.  Bandaids may be used to cover the incisions until scab formation.  Topical ointments are optional.  - If you develop a fever greater than 101 degrees, have increased skin redness at the incision sites or pus-like oozing from incisions occurring within 1 week of the procedure, contact radiology at [phone] or [phone].  - Ice pack to back for 15-20 minutes 2-3 time per day for first 2-3 days post procedure.  The ice will expedite muscle healing and help with the pain from the incisions.   Activity: - Bedrest today with limited activity for 24 hours post procedure.  - No driving for 48 hours.  - Increase your activity as tolerated after bedrest (with assistance if necessary).  - Refrain from any strenuous activity or heavy lifting (greater than 10 lbs.).   Follow up: - Contact radiology at [phone] or [phone] if any questions/concerns.  - A physician assistant from radiology will contact you in approximately 1 week.  - If a biopsy was performed at the time of your procedure, your referring physician should receive the results in usually 2-3 days.        1.No stooping,bending,or lifting more than 10 lbs for 2 weeks. 2.Use walker to ambulate for 2 weeks. 3.No driving for 2 weeks.Marland Kitchen 4.RTC PRN in 2 weeks.

## 2016-11-28 NOTE — Sedation Documentation (Addendum)
dsg to low  back intact

## 2016-12-01 ENCOUNTER — Encounter (HOSPITAL_COMMUNITY): Payer: Self-pay | Admitting: Interventional Radiology

## 2016-12-01 ENCOUNTER — Telehealth: Payer: Self-pay | Admitting: Cardiovascular Disease

## 2016-12-01 NOTE — Telephone Encounter (Signed)
Richard [name] is calling in reference to Richard [name]  Blood work , that they want to coordinate with his family doctor.

## 2016-12-01 NOTE — Telephone Encounter (Signed)
Spoke with patient's wife who states they received a call about lab appointment on Thursday but they planned to get lab work at Dr. Danna Hefty office. I advised that I had spoken to Amy, Dr. Danna Hefty nurse and apologized for not cancelling the lab appointment here at our office. She thanked me for the call.

## 2016-12-04 ENCOUNTER — Other Ambulatory Visit: Payer: Medicare Other

## 2016-12-04 DIAGNOSIS — E538 Deficiency of other specified B group vitamins: Secondary | ICD-10-CM | POA: Diagnosis not present

## 2016-12-04 DIAGNOSIS — E038 Other specified hypothyroidism: Secondary | ICD-10-CM | POA: Diagnosis not present

## 2016-12-04 DIAGNOSIS — E1122 Type 2 diabetes mellitus with diabetic chronic kidney disease: Secondary | ICD-10-CM | POA: Diagnosis not present

## 2016-12-04 DIAGNOSIS — I10 Essential (primary) hypertension: Secondary | ICD-10-CM | POA: Diagnosis not present

## 2016-12-04 DIAGNOSIS — Z125 Encounter for screening for malignant neoplasm of prostate: Secondary | ICD-10-CM | POA: Diagnosis not present

## 2016-12-04 DIAGNOSIS — R8299 Other abnormal findings in urine: Secondary | ICD-10-CM | POA: Diagnosis not present

## 2016-12-05 ENCOUNTER — Telehealth: Payer: Self-pay | Admitting: Radiology

## 2016-12-05 NOTE — Telephone Encounter (Signed)
   Pt had Lumbar 1 and 2 VP/KP performed in IR 4/6  Pain has diminished Somewhat Still having to use Tramadol regularly  Pain mostly in low back  Mostly when lays down to sleep Has been able to walk easier; using walker daily Has been able to sleep better  No signs of redness or infection No fever No bleeding No palpable hematoma per wife.  Rec heat to area at night Continue Tramadol regularly Continue Tylenol as needed  Plan for 2 week follow up with Dr Estanislado Pandy Call [phone] if pt doesn't hear from scheduler

## 2016-12-10 ENCOUNTER — Other Ambulatory Visit (HOSPITAL_COMMUNITY): Payer: Self-pay | Admitting: Interventional Radiology

## 2016-12-10 DIAGNOSIS — M4850XA Collapsed vertebra, not elsewhere classified, site unspecified, initial encounter for fracture: Principal | ICD-10-CM

## 2016-12-10 DIAGNOSIS — IMO0001 Reserved for inherently not codable concepts without codable children: Secondary | ICD-10-CM

## 2016-12-11 ENCOUNTER — Encounter (HOSPITAL_COMMUNITY): Payer: Self-pay | Admitting: Emergency Medicine

## 2016-12-11 ENCOUNTER — Emergency Department (HOSPITAL_COMMUNITY): Payer: Medicare Other

## 2016-12-11 ENCOUNTER — Telehealth: Payer: Self-pay | Admitting: Cardiovascular Disease

## 2016-12-11 ENCOUNTER — Telehealth (HOSPITAL_COMMUNITY): Payer: Self-pay | Admitting: Radiology

## 2016-12-11 ENCOUNTER — Emergency Department (HOSPITAL_COMMUNITY)
Admission: EM | Admit: 2016-12-11 | Discharge: 2016-12-11 | Disposition: A | Discharge status: Home / Self Care | Payer: Medicare Other | Attending: Emergency Medicine | Admitting: Emergency Medicine

## 2016-12-11 DIAGNOSIS — Y929 Unspecified place or not applicable: Secondary | ICD-10-CM | POA: Insufficient documentation

## 2016-12-11 DIAGNOSIS — S22000A Wedge compression fracture of unspecified thoracic vertebra, initial encounter for closed fracture: Secondary | ICD-10-CM

## 2016-12-11 DIAGNOSIS — M545 Low back pain: Secondary | ICD-10-CM | POA: Diagnosis not present

## 2016-12-11 DIAGNOSIS — Z87891 Personal history of nicotine dependence: Secondary | ICD-10-CM | POA: Diagnosis not present

## 2016-12-11 DIAGNOSIS — Y999 Unspecified external cause status: Secondary | ICD-10-CM | POA: Diagnosis not present

## 2016-12-11 DIAGNOSIS — E1122 Type 2 diabetes mellitus with diabetic chronic kidney disease: Secondary | ICD-10-CM | POA: Diagnosis not present

## 2016-12-11 DIAGNOSIS — R05 Cough: Secondary | ICD-10-CM | POA: Diagnosis not present

## 2016-12-11 DIAGNOSIS — I5022 Chronic systolic (congestive) heart failure: Secondary | ICD-10-CM | POA: Diagnosis not present

## 2016-12-11 DIAGNOSIS — X58XXXA Exposure to other specified factors, initial encounter: Secondary | ICD-10-CM | POA: Insufficient documentation

## 2016-12-11 DIAGNOSIS — Y939 Activity, unspecified: Secondary | ICD-10-CM | POA: Insufficient documentation

## 2016-12-11 DIAGNOSIS — I130 Hypertensive heart and chronic kidney disease with heart failure and stage 1 through stage 4 chronic kidney disease, or unspecified chronic kidney disease: Secondary | ICD-10-CM | POA: Diagnosis not present

## 2016-12-11 DIAGNOSIS — M4854XA Collapsed vertebra, not elsewhere classified, thoracic region, initial encounter for fracture: Secondary | ICD-10-CM | POA: Diagnosis not present

## 2016-12-11 DIAGNOSIS — Z8673 Personal history of transient ischemic attack (TIA), and cerebral infarction without residual deficits: Secondary | ICD-10-CM | POA: Insufficient documentation

## 2016-12-11 DIAGNOSIS — N189 Chronic kidney disease, unspecified: Secondary | ICD-10-CM | POA: Diagnosis not present

## 2016-12-11 DIAGNOSIS — Z950 Presence of cardiac pacemaker: Secondary | ICD-10-CM | POA: Diagnosis not present

## 2016-12-11 DIAGNOSIS — S3992XA Unspecified injury of lower back, initial encounter: Secondary | ICD-10-CM | POA: Diagnosis not present

## 2016-12-11 DIAGNOSIS — Z79899 Other long term (current) drug therapy: Secondary | ICD-10-CM | POA: Insufficient documentation

## 2016-12-11 DIAGNOSIS — S299XXA Unspecified injury of thorax, initial encounter: Secondary | ICD-10-CM | POA: Diagnosis present

## 2016-12-11 LAB — COMPREHENSIVE METABOLIC PANEL
ALBUMIN: 2.9 g/dL — AB (ref 3.5–5.0)
ALK PHOS: 118 U/L (ref 38–126)
ALT: 19 U/L (ref 17–63)
ANION GAP: 11 (ref 5–15)
AST: 30 U/L (ref 15–41)
BUN: 28 mg/dL — ABNORMAL HIGH (ref 6–20)
CALCIUM: 9 mg/dL (ref 8.9–10.3)
CO2: 25 mmol/L (ref 22–32)
Chloride: 100 mmol/L — ABNORMAL LOW (ref 101–111)
Creatinine, Ser: 1.48 mg/dL — ABNORMAL HIGH (ref 0.61–1.24)
GFR calc Af Amer: 48 mL/min — ABNORMAL LOW (ref >60)
GFR, EST NON AFRICAN AMERICAN: 42 mL/min — AB (ref >60)
GLUCOSE: 169 mg/dL — AB (ref 65–99)
POTASSIUM: 3.9 mmol/L (ref 3.5–5.1)
Sodium: 136 mmol/L (ref 135–145)
TOTAL PROTEIN: 7.6 g/dL (ref 6.5–8.1)
Total Bilirubin: 1 mg/dL (ref 0.3–1.2)

## 2016-12-11 LAB — CBC
HEMATOCRIT: 33 % — AB (ref 39.0–52.0)
HEMOGLOBIN: 10.4 g/dL — AB (ref 13.0–17.0)
MCH: 27.6 pg (ref 26.0–34.0)
MCHC: 31.5 g/dL (ref 30.0–36.0)
MCV: 87.5 fL (ref 78.0–100.0)
Platelets: 279 10*3/uL (ref 150–400)
RBC: 3.77 MIL/uL — ABNORMAL LOW (ref 4.22–5.81)
RDW: 17.8 % — ABNORMAL HIGH (ref 11.5–15.5)
WBC: 9.9 10*3/uL (ref 4.0–10.5)

## 2016-12-11 LAB — URINALYSIS, ROUTINE W REFLEX MICROSCOPIC
Bacteria, UA: NONE SEEN
Bilirubin Urine: NEGATIVE
GLUCOSE, UA: NEGATIVE mg/dL
KETONES UR: NEGATIVE mg/dL
LEUKOCYTES UA: NEGATIVE
NITRITE: NEGATIVE
PH: 5 (ref 5.0–8.0)
Protein, ur: 30 mg/dL — AB
Specific Gravity, Urine: 1.016 (ref 1.005–1.030)
Squamous Epithelial / LPF: NONE SEEN

## 2016-12-11 LAB — LIPASE, BLOOD: Lipase: 34 U/L (ref 11–51)

## 2016-12-11 MED ORDER — SODIUM CHLORIDE 0.9 % IV BOLUS (SEPSIS)
500.0000 mL | Freq: Once | INTRAVENOUS | Status: AC
  Administered 2016-12-11: 500 mL via INTRAVENOUS

## 2016-12-11 MED ORDER — FUROSEMIDE 40 MG PO TABS: 40.0000 mg | ORAL_TABLET | ORAL | 11 refills | Status: DC

## 2016-12-11 MED ORDER — POTASSIUM CHLORIDE ER 10 MEQ PO TBCR: 10.0000 meq | EXTENDED_RELEASE_TABLET | ORAL | 11 refills | Status: DC

## 2016-12-11 NOTE — Telephone Encounter (Signed)
Spoke with patient's wife who states patient is very weak and has lost 29 lbs since 4/11. She states she believes her scales are accurate because she got normal weights on herself and their son. States patient had kyphoplasty last week and was advised to walk as frequently as possible around the house. She states it is very difficult to get him to walk and she has to have assistance to get him up. She states he has decreased appetite and blood sugar is unknown. Reports BP 105/53 mmHg, HR 79 bpm today. She states he does not urinate as much as he did previously. Per Dr. Acie Fredrickson, change Kdur and Lasix to every other day and call Dr. Dagmar Hait today for evaluation of profound weight loss. I advised her to call back if patient cannot get an appointment today or tomorrow. She verbalized understanding and agreement with plan and thanked me for the call.

## 2016-12-11 NOTE — Consult Note (Signed)
CC:  Chief Complaint  Patient presents with  . Back Pain    HPI: Richard [name] is a 83 y.o. male who presented to the ER for generalized weakness. In addition, he was also complaining of back pain.  History given by wife at bedside due to pt dementia. Back pain started Feb after a fall. Wife reports he suffered multiple compression fractures and was seeing interventional radiology for management. She reports he had a vertebroplasty/kyphoplasty on 4/6. No new trauma. She reports he is able to ambulate. Unsure if he is having bowel/bladder dysfunction.  PMH: Past Medical History:  Diagnosis Date  . Anxiety   . Arthritis   . Atrial fibrillation ([place])    a. Dx 03/2013, notes report atrial fibrillation/atrial flutter, placed on amiodarone. NSR in subsequent OV's.  . B12 deficiency anemia   . Chest pain    a. H/o CTA negative for PE 2012, normal cath 2005, normal nucs previously including 05/2012.  Marland Kitchen Complete heart block (HCC)    a. s/p Medtronic Adapta L model ADDRL 1 (serial number [id number]) pacemaker.  Marland Kitchen GERD (gastroesophageal reflux disease)   . Hiatal hernia   . Hyperlipidemia   . Hypertension   . Iron deficiency anemia   . LBBB (left bundle branch block)   . Microcytic anemia   . Nephrolithiasis   . On home oxygen therapy    a. 2L w/CPAP at night  . On home oxygen therapy    "w/his CPAP at night; don't know how much" (05/21/2016)  . Orthostatic hypotension   . OSA on CPAP   . Rocky Mountain spotted fever ~ 1945  . Sinus drainage   . Skin cancer of lip   . Stroke ([place])   . Type II diabetes mellitus (HCC)     PSH: Past Surgical History:  Procedure Laterality Date  . CARDIAC CATHETERIZATION     by Dr. Acie Fredrickson, January 24, 2004, that shows minimal coronary artery irregularities and normal left ventricular function  . CATARACT EXTRACTION W/ INTRAOCULAR LENS  IMPLANT, BILATERAL Bilateral   . COLONOSCOPY WITH PROPOFOL N/A 05/23/2016   Procedure: COLONOSCOPY WITH PROPOFOL;   Surgeon: Jerene Bears, MD;  Location: [place];  Service: Gastroenterology;  Laterality: N/A;  . ENTEROSCOPY N/A 06/02/2016   Procedure: ENTEROSCOPY;  Surgeon: Gatha Mayer, MD;  Location[facility] ENDOSCOPY;  Service: Endoscopy;  Laterality: N/A;  . ESOPHAGOGASTRODUODENOSCOPY N/A 05/22/2016   Procedure: ESOPHAGOGASTRODUODENOSCOPY (EGD);  Surgeon: Jerene Bears, MD;  Location: [facility] ENDOSCOPY;  Service: Endoscopy;  Laterality: N/A;  . ESOPHAGOGASTRODUODENOSCOPY (EGD) WITH PROPOFOL N/A 06/21/2015   Procedure: ESOPHAGOGASTRODUODENOSCOPY (EGD) WITH PROPOFOL;  Surgeon: Gatha Mayer, MD;  [facility]OS[facility];  Service: Endoscopy;  Laterality: N/A;  . GIVENS CAPSULE STUDY N/A 05/23/2016   Procedure: GIVENS CAPSULE STUDY;  Surgeon: Jerene Bears, MD;  Location: [place];  Service: Gastroenterology;  Laterality: N/A;  . HOT HEMOSTASIS N/A 06/02/2016   Procedure: HOT HEMOSTASIS (ARGON PLASMA COAGULATION/BICAP);  Surgeon: Gatha Mayer, MD;  Location: [facility];  Service: Endoscopy;  Laterality: N/A;  . INGUINAL HERNIA REPAIR Right   . IR RADIOLOGIST EVAL & MGMT  11/25/2016  . IR VERTEBROPLASTY EA ADDL (T&LS) BX INC UNI/BIL INC INJECT/IMAGING  11/28/2016  . IR VERTEBROPLASTY LUMBAR BX INC UNI/BIL INC/INJECT/IMAGING  11/28/2016  . LUMBAR DISC SURGERY  ~ 1993  . PERMANENT PACEMAKER INSERTION N/A 06/03/2014   MDT Adapta L implanted by Dr Rayann Heman for syncope and transient AV block  . SKIN CANCER  EXCISION     "lower lip" (04/06/2013)  . TEMPORARY PACEMAKER INSERTION N/A 05/31/2014   Procedure: TEMPORARY WIRE;  Surgeon: Sinclair Grooms, MD;  Location: [facility] CATH LAB;  Service: Cardiovascular;  Laterality: N/A;  . VASECTOMY     Hx of     SH: Social History  Substance Use Topics  . Smoking status: Former Smoker    Packs/day: 0.00    Years: 20.00    Types: Cigarettes  . Smokeless tobacco: Never Used     Comment: 04/06/2013 "quit smoking years ago; didn't smoke that much; probably 20 years; 1ppd"  . Alcohol  use No    MEDS: Prior to Admission medications   Medication Sig Start Date End Date Taking? Authorizing Provider  acetaminophen (TYLENOL) 500 MG tablet Take 500-1,000 mg by mouth 2 (two) times daily.    Yes Historical Provider, MD  amiodarone (PACERONE) 200 MG tablet TAKE 1 TABLET (200 MG TOTAL) BY MOUTH DAILY. Patient taking differently: TAKE 1 TABLET (200 MG TOTAL) BY MOUTH ON MON, WED, AND FRI 10/03/16  Yes Thayer Headings, MD  apixaban (ELIQUIS) 5 MG TABS tablet Take 1 tablet (5 mg total) by mouth 2 (two) times daily. 06/06/16  Yes Thayer Headings, MD  atorvastatin (LIPITOR) 40 MG tablet Take 1 tablet (40 mg total) by mouth daily at 6 PM. 05/16/16  Yes Ivan Anchors Love, PA-C  cyanocobalamin (,VITAMIN B-12,) 1000 MCG/ML injection Inject 1,000 mcg into the muscle every 30 (thirty) days.   Yes Historical Provider, MD  diazepam (VALIUM) 5 MG tablet Take 5 mg by mouth at bedtime.    Yes Historical Provider, MD  fluticasone (FLONASE) 50 MCG/ACT nasal spray Place 1 spray into the nose daily as needed for allergies or rhinitis.    Yes Historical Provider, MD  furosemide (LASIX) 40 MG tablet Take 1 tablet (40 mg total) by mouth every other day. 12/11/16 03/11/17 Yes Thayer Headings, MD  Menthol, Topical Analgesic, (ABSORBINE PAIN RELIEVING EX) Apply 1 application topically daily as needed (muscle pain).   Yes Historical Provider, MD  metoprolol (LOPRESSOR) 50 MG tablet TAKE 1 TABLET BY MOUTH TWICE A DAY 07/07/16  Yes Thayer Headings, MD  pantoprazole (PROTONIX) 40 MG tablet Take 1 tablet (40 mg total) by mouth daily at 6 (six) AM. Patient taking differently: Take 20 mg by mouth daily at 6 (six) AM.  05/25/16  Yes Kelvin Cellar, MD  potassium chloride (K-DUR) 10 MEQ tablet Take 1 tablet (10 mEq total) by mouth every other day. 12/11/16 03/11/17 Yes Thayer Headings, MD  sertraline (ZOLOFT) 100 MG tablet Take 100 mg by mouth daily.  08/21/14  Yes Historical Provider, MD  traMADol (ULTRAM) 50 MG tablet Take 50 mg  by mouth 4 (four) times daily as needed for moderate pain.  10/17/16  Yes Historical Provider, MD    ALLERGY: Allergies  Allergen Reactions  . Codeine Other (See Comments)    Hallucinations   . Citalopram Swelling and Rash    ROS: Unable to obtain due to dementia ROS  Vitals:   12/11/16 1900 12/11/16 2015  BP: (!) 147/71 (!) 147/72  Pulse: 72 70  Resp:  16  Temp:     General appearance: Laying on exam table. Agitated. Trying to get up to leave. Eyes:  Cardiovascular: Regular rate and rhythm without murmurs, rubs, gallops. No edema or variciosities. Distal pulses normal. Pulmonary: Clear to auscultation Musculoskeletal:     Muscle tone upper extremities: Normal  Muscle tone lower extremities: Normal    Motor exam: Upper Extremities Deltoid Bicep Tricep Grip  Right 5/5 5/5 5/5 5/5  Left 5/5 5/5 5/5 5/5   Lower Extremity IP Quad PF DF EHL  Right 5/5 5/5 5/5 5/5 5/5  Left 5/5 5/5 5/5 5/5 5/5   Neurological Awake, alert Oriented to person Follows simple commands CNII: Visual fields normal CNIII/IV/VI: EOMI CNV: Facial sensation normal CNVII: Symmetric, normal strength CNVIII: Grossly normal CNIX: Normal palate movement CNXI: Trap and SCM strength normal CN XII: Tongue protrusion normal Sensation grossly intact to LT  IMAGING: CT Lumbar spine IMPRESSION: 1. Interval approximately 35% comminuted T11 vertebral body compression fracture with patchy sclerosis and no bony retropulsion. 2. Stable L1 and L2 vertebral compression deformities with interval vertebroplasty material. 3. Degenerative changes, as described above.  IMPRESSION/PLAN - 83 y.o. male with T11 comminuted vertebral body compression fracture not seen on Xray from 2/19 or 3/20. No reported recent trauma that would have caused this.  He is neurologically intact with the exception of his dementia. No surgical intervention necessary for his compression fracture. Discussed with wife that he can f/u with  [place] outpt for monitoring, but prefers to see PCP and IR for follow up. Rec a TLSO brace for sx relief, but she declines at this time.

## 2016-12-11 NOTE — ED Triage Notes (Signed)
Pt sts lower back pain since fall 3 weeks ago; pt is poor historian but is alert to person and place; pt denies cough; pt sts eating well

## 2016-12-11 NOTE — ED Notes (Signed)
Pt and family stated they are ready to leave. Informed that EDP is still waiting to speak to neurosurgery. Pt and family voiced understanding.

## 2016-12-11 NOTE — Telephone Encounter (Signed)
New message      Calling to let the nurse know that pt's weight was 238 on 12-03-16.  Today it is 209lbs.  Wife states that pt is very weak.  He is currently taking furosemide 40mg  daily.  Please advise

## 2016-12-11 NOTE — ED Notes (Signed)
Repositioned pt with the assistance of Brenda(RN)

## 2016-12-11 NOTE — Telephone Encounter (Signed)
Pt's wife called to let us know that patient is in the ED with continued back pain, weight loss, as well as other issues. He had an appointment scheduled for follow-up with Dr. Estanislado Pandy scheduled for tomorrow (12/12/16). We have cancelled this appointment as of now. The patient will hopefully get new scans of his back during his time in the ER should a new compression fracture be suspected. An MRI w/o is the preferred imaging study for diagnosing these fractures. JM

## 2016-12-11 NOTE — ED Provider Notes (Signed)
[place] DEPT Provider Note   CSN: [id number] Arrival date & time: 12/11/16  1336     History   Chief Complaint Chief Complaint  Patient presents with  . Back Pain    HPI Richard [name] is a 83 y.o. male.  The history is provided by the patient, the spouse and medical records.  Weakness  Primary symptoms comment: Generalized weakness. This is a new problem. The current episode started more than 2 days ago. The problem has not changed since onset.There has been no fever. Associated symptoms include shortness of breath (chronic) and confusion (chronic). Pertinent negatives include no chest pain, no vomiting and no altered mental status. Associated medical issues include dementia and CVA.    Past Medical History:  Diagnosis Date  . Anxiety   . Arthritis   . Atrial fibrillation ([place])    a. Dx 03/2013, notes report atrial fibrillation/atrial flutter, placed on amiodarone. NSR in subsequent OV's.  . B12 deficiency anemia   . Chest pain    a. H/o CTA negative for PE 2012, normal cath 2005, normal nucs previously including 05/2012.  Marland Kitchen Complete heart block (HCC)    a. s/p Medtronic Adapta L model ADDRL 1 (serial number [id number]) pacemaker.  Marland Kitchen GERD (gastroesophageal reflux disease)   . Hiatal hernia   . Hyperlipidemia   . Hypertension   . Iron deficiency anemia   . LBBB (left bundle branch block)   . Microcytic anemia   . Nephrolithiasis   . On home oxygen therapy    a. 2L w/CPAP at night  . On home oxygen therapy    "w/his CPAP at night; don't know how much" (05/21/2016)  . Orthostatic hypotension   . OSA on CPAP   . Rocky Mountain spotted fever ~ 1945  . Sinus drainage   . Skin cancer of lip   . Stroke ([place])   . Type II diabetes mellitus [facility])     Patient Active Problem List   Diagnosis Date Noted  . Chronic diastolic CHF (congestive heart failure) ([place]) 11/16/2016  . AVM (arteriovenous malformation) of small bowel, acquired ([place])   . Abnormality of gait as  late effect of stroke 05/27/2016  . Benign neoplasm of cecum   . Benign neoplasm of sigmoid colon   . GI bleeding 05/22/2016  . Melena   . GIB (gastrointestinal bleeding) 05/21/2016  . Diabetes mellitus with complication ([place])   . Upper GI bleed   . CKD (chronic kidney disease)   . Type 2 diabetes mellitus with circulatory disorder, without long-term current use of insulin ([place])   . Acute right PCA stroke ([place]) 05/08/2016  . Ataxia due to recent stroke   . Gait disturbance, post-stroke   . Cerebellar stroke, acute ([place]) 05/07/2016  . Hyperlipidemia   . Chronic anticoagulation   . Cardiac pacemaker in situ   . Acute blood loss anemia   . Cognitive deficits   . Cognitive deficit due to recent stroke   . Stroke-related cognitive dysfunction   . Generalized weakness 05/05/2016  . Dizziness   . Absolute anemia 02/08/2016  . Weakness 02/01/2016  . Gastric polyp 06/21/2015  . Iron deficiency anemia due to chronic blood loss   . GERD (gastroesophageal reflux disease)   . B12 deficiency anemia   . Bradycardia with less than 30 beats per minute 05/31/2014  . Complete heart block ([place]) 05/31/2014  . Syncope 05/31/2014  . Anemia 05/31/2014  . Atrial fibrillation ([place]) 04/08/2013  . Diabetes mellitus ([place])  04/06/2013  . Hiatal hernia 04/06/2013  . Essential hypertension 06/13/2011  . OSA (obstructive sleep apnea) 06/13/2011    Past Surgical History:  Procedure Laterality Date  . CARDIAC CATHETERIZATION     by Dr. Acie Fredrickson, January 24, 2004, that shows minimal coronary artery irregularities and normal left ventricular function  . CATARACT EXTRACTION W/ INTRAOCULAR LENS  IMPLANT, BILATERAL Bilateral   . COLONOSCOPY WITH PROPOFOL N/A 05/23/2016   Procedure: COLONOSCOPY WITH PROPOFOL;  Surgeon: Jerene Bears, MD;  Location: [place];  Service: Gastroenterology;  Laterality: N/A;  . ENTEROSCOPY N/A 06/02/2016   Procedure: ENTEROSCOPY;  Surgeon: Gatha Mayer, MD;  [facility] ENDOSCOPY;   Service: Endoscopy;  Laterality: N/A;  . ESOPHAGOGASTRODUODENOSCOPY N/A 05/22/2016   Procedure: ESOPHAGOGASTRODUODENOSCOPY (EGD);  Surgeon: Jerene Bears, MD;  Location: [facility] ENDOSCOPY;  Service: Endoscopy;  Laterality: N/A;  . ESOPHAGOGASTRODUODENOSCOPY (EGD) WITH PROPOFOL N/A 06/21/2015   Procedure: ESOPHAGOGASTRODUODENOSCOPY (EGD) WITH PROPOFOL;  Surgeon: Gatha Mayer, MD;  [facility] ENDOSCOPY;  Service: Endoscopy;  Laterality: N/A;  . GIVENS CAPSULE STUDY N/A 05/23/2016   Procedure: GIVENS CAPSULE STUDY;  Surgeon: Jerene Bears, MD;  Location: [place];  Service: Gastroenterology;  Laterality: N/A;  . HOT HEMOSTASIS N/A 06/02/2016   Procedure: HOT HEMOSTASIS (ARGON PLASMA COAGULATION/BICAP);  Surgeon: Gatha Mayer, MD;  Location: [facility];  Service: Endoscopy;  Laterality: N/A;  . INGUINAL HERNIA REPAIR Right   . IR RADIOLOGIST EVAL & MGMT  11/25/2016  . IR VERTEBROPLASTY EA ADDL (T&LS) BX INC UNI/BIL INC INJECT/IMAGING  11/28/2016  . IR VERTEBROPLASTY LUMBAR BX INC UNI/BIL INC/INJECT/IMAGING  11/28/2016  . LUMBAR DISC SURGERY  ~ 1993  . PERMANENT PACEMAKER INSERTION N/A 06/03/2014   MDT Adapta L implanted by Dr Rayann Heman for syncope and transient AV block  . SKIN CANCER EXCISION     "lower lip" (04/06/2013)  . TEMPORARY PACEMAKER INSERTION N/A 05/31/2014   Procedure: TEMPORARY WIRE;  Surgeon: Sinclair Grooms, MD;  Location: [facility] CATH LAB;  Service: Cardiovascular;  Laterality: N/A;  . VASECTOMY     Hx of     OB History    No data available       Home Medications    Prior to Admission medications   Medication Sig Start Date End Date Taking? Authorizing Provider  acetaminophen (TYLENOL) 500 MG tablet Take 500-1,000 mg by mouth 2 (two) times daily.     Historical Provider, MD  amiodarone (PACERONE) 200 MG tablet TAKE 1 TABLET (200 MG TOTAL) BY MOUTH DAILY. Patient taking differently: TAKE 1 TABLET (200 MG TOTAL) BY MOUTH ON MON, WED, AND FRI 10/03/16   Thayer Headings, MD  apixaban  (ELIQUIS) 5 MG TABS tablet Take 1 tablet (5 mg total) by mouth 2 (two) times daily. 06/06/16   Thayer Headings, MD  atorvastatin (LIPITOR) 40 MG tablet Take 1 tablet (40 mg total) by mouth daily at 6 PM. 05/16/16   Ivan Anchors Love, PA-C  cyanocobalamin (,VITAMIN B-12,) 1000 MCG/ML injection Inject 1,000 mcg into the muscle every 30 (thirty) days.    Historical Provider, MD  diazepam (VALIUM) 5 MG tablet Take 5 mg by mouth at bedtime.     Historical Provider, MD  fluticasone (FLONASE) 50 MCG/ACT nasal spray Place 1 spray into the nose daily as needed for allergies or rhinitis.     Historical Provider, MD  furosemide (LASIX) 40 MG tablet Take 1 tablet (40 mg total) by mouth every other day. 12/11/16 03/11/17  Wonda Cheng  Nahser, MD  Menthol, Topical Analgesic, (ABSORBINE PAIN RELIEVING EX) Apply 1 application topically daily as needed (muscle pain).    Historical Provider, MD  metoprolol (LOPRESSOR) 50 MG tablet TAKE 1 TABLET BY MOUTH TWICE A DAY 07/07/16   Thayer Headings, MD  pantoprazole (PROTONIX) 40 MG tablet Take 1 tablet (40 mg total) by mouth daily at 6 (six) AM. Patient taking differently: Take 20 mg by mouth daily at 6 (six) AM.  05/25/16   Kelvin Cellar, MD  potassium chloride (K-DUR) 10 MEQ tablet Take 1 tablet (10 mEq total) by mouth every other day. 12/11/16 03/11/17  Thayer Headings, MD  sertraline (ZOLOFT) 100 MG tablet Take 100 mg by mouth daily.  08/21/14   Historical Provider, MD  traMADol (ULTRAM) 50 MG tablet Take 50 mg by mouth 4 (four) times daily as needed for moderate pain.  10/17/16   Historical Provider, MD    Family History Family History  Problem Relation Age of Onset  . Thyroid disease Mother     goiter  . Pneumonia Father   . Other      Parents both died of old age; other conditions not known  . Stroke Brother   . Breast cancer Sister     Social History Social History  Substance Use Topics  . Smoking status: Former Smoker    Packs/day: 0.00    Years: 20.00     Types: Cigarettes  . Smokeless tobacco: Never Used     Comment: 04/06/2013 "quit smoking years ago; didn't smoke that much; probably 20 years; 1ppd"  . Alcohol use No     Allergies   Codeine and Citalopram   Review of Systems Review of Systems  Constitutional: Negative for chills and fever.  HENT: Negative for ear pain and sore throat.   Eyes: Negative for pain and visual disturbance.  Respiratory: Positive for shortness of breath (chronic). Negative for cough.   Cardiovascular: Negative for chest pain and palpitations.  Gastrointestinal: Negative for abdominal pain and vomiting.  Genitourinary: Negative for dysuria and hematuria.  Musculoskeletal: Positive for back pain. Negative for arthralgias.  Skin: Negative for color change and rash.  Neurological: Positive for weakness. Negative for seizures and syncope.  Psychiatric/Behavioral: Positive for confusion (chronic).  All other systems reviewed and are negative.    Physical Exam Updated Vital Signs BP 105/66 (BP Location: Right Arm)   Pulse 78   Temp 97.6 F (36.4 C) (Oral)   Resp 18   SpO2 93%   Physical Exam  Constitutional: He appears well-developed and well-nourished.  HENT:  Head: Normocephalic and atraumatic.  Eyes: Conjunctivae are normal.  Neck: Neck supple.  Cardiovascular: Normal rate and regular rhythm.   No murmur heard. Pulmonary/Chest: Effort normal and breath sounds normal. No respiratory distress.  Abdominal: Soft. There is no tenderness.  Musculoskeletal: He exhibits no edema or deformity.  Neurological: He is alert.  Moving all 4ext, 5/5 strength in b/l LE, no focal sensory deficits; gait deferred  Skin: Skin is warm and dry.  Psychiatric: He has a normal mood and affect. His behavior is normal.  Nursing note and vitals reviewed.   ED Treatments / Results  Labs (all labs ordered are listed, but only abnormal results are displayed) Labs Reviewed  COMPREHENSIVE METABOLIC PANEL - Abnormal;  Notable for the following:       Result Value   Chloride 100 (*)    Glucose, Bld 169 (*)    BUN 28 (*)    Creatinine,  Ser 1.48 (*)    Albumin 2.9 (*)    GFR calc non Af Amer 42 (*)    GFR calc Af Amer 48 (*)    All other components within normal limits  CBC - Abnormal; Notable for the following:    RBC 3.77 (*)    Hemoglobin 10.4 (*)    HCT 33.0 (*)    RDW 17.8 (*)    All other components within normal limits  LIPASE, BLOOD  URINALYSIS, ROUTINE W REFLEX MICROSCOPIC    EKG  EKG Interpretation None       Radiology Dg Chest 2 View  Result Date: 12/11/2016 CLINICAL DATA:  Weight loss, cough and back pain. EXAM: CHEST  2 VIEW COMPARISON:  05/05/2016 FINDINGS: The heart is enlarged but stable. There is tortuosity, ectasia and calcification of the thoracic aorta. Ileum is enlarged stable. Stable hiatal hernia. No acute pulmonary findings. Chronic lung changes. The bony thorax is intact. IMPRESSION: Stable cardiac enlargement and tortuous calcified thoracic aorta. Large hiatal hernia with overlying atelectasis. No infiltrates, edema or effusions. Electronically Signed   By: Marijo Sanes M.D.   On: 12/11/2016 15:01    Procedures Procedures (including critical care time)  Medications Ordered in ED Medications - No data to display   Initial Impression / Assessment and Plan / ED Course  I have reviewed the triage vital signs and the nursing notes.  Pertinent labs & imaging results that were available during my care of the patient were reviewed by me and considered in my medical decision making (see chart for details).    Pt with h/o dementia, HTN, HLD, Afib (on Xarelto), GERD, hiatal hernia, anemia, PPM, orthostatic HoTN presents with weakness. Had a L1/2 vertebroplasty/kyphoplasty on 4/6 for fractures he presumably sustained during a fall in February. The wife says he's been told to walk frequently, but has been too weak to get out of bed on his own; she also notes that he's lost  29lbs in the last 8days. The Pt says he has increased pain in his back near his surgical site when he wakes up in the morning, but denies any new trauma to the area.  VS & exam as above. CXR stable chronic changes w/o. Infiltrates, edema, or effusions. Labs consistent with mild dehydration, but otherwise unremarkable. UA negative for infection.  Pt's PPM is incompatible w/MRI, so CT lumbar spine w/o ordered & shows interval 35% comminuted T11 VB compression fx w/patchy sclerosis not visible on XR from 10/13/16 or CT from 11/11/16.  [facility] consulted & evaluated the Pt in the ED; not recommending any acute intervention. Offered the Pt f/u at their clinic and a TLSO brace, but the wife declined; she would rather speak w/her PCP and get his opinion on how to proceed.  Explained all results to the Pt & wife. Will discharge the Pt home. Recommending follow-up with PCP. ED return precautions provided. Pt acknowledged understanding of, and concurrence with the plan. All questions answered to the wife's satisfaction. In stable condition at the time of discharge.  Final Clinical Impressions(s) / ED Diagnoses   Final diagnoses:  Compression fracture of body of thoracic vertebra [facility])    New Prescriptions New Prescriptions   No medications on file     Jenny Reichmann, MD 12/11/16 [place], MD 12/17/16 [phone]

## 2016-12-12 ENCOUNTER — Ambulatory Visit (HOSPITAL_COMMUNITY): Admission: RE | Admit: 2016-12-12 | Payer: Medicare Other | Source: Ambulatory Visit

## 2016-12-15 DIAGNOSIS — E538 Deficiency of other specified B group vitamins: Secondary | ICD-10-CM | POA: Diagnosis not present

## 2016-12-15 DIAGNOSIS — E1122 Type 2 diabetes mellitus with diabetic chronic kidney disease: Secondary | ICD-10-CM | POA: Diagnosis not present

## 2016-12-15 DIAGNOSIS — I480 Paroxysmal atrial fibrillation: Secondary | ICD-10-CM | POA: Diagnosis not present

## 2016-12-15 DIAGNOSIS — D62 Acute posthemorrhagic anemia: Secondary | ICD-10-CM | POA: Diagnosis not present

## 2016-12-15 DIAGNOSIS — Z1389 Encounter for screening for other disorder: Secondary | ICD-10-CM | POA: Diagnosis not present

## 2016-12-15 DIAGNOSIS — G4733 Obstructive sleep apnea (adult) (pediatric): Secondary | ICD-10-CM | POA: Diagnosis not present

## 2016-12-15 DIAGNOSIS — I5021 Acute systolic (congestive) heart failure: Secondary | ICD-10-CM | POA: Diagnosis not present

## 2016-12-15 DIAGNOSIS — E038 Other specified hypothyroidism: Secondary | ICD-10-CM | POA: Diagnosis not present

## 2016-12-15 DIAGNOSIS — R413 Other amnesia: Secondary | ICD-10-CM | POA: Diagnosis not present

## 2016-12-15 DIAGNOSIS — N183 Chronic kidney disease, stage 3 (moderate): Secondary | ICD-10-CM | POA: Diagnosis not present

## 2016-12-15 DIAGNOSIS — R419 Unspecified symptoms and signs involving cognitive functions and awareness: Secondary | ICD-10-CM | POA: Diagnosis not present

## 2016-12-15 DIAGNOSIS — Z0000 Encounter for general adult medical examination without abnormal findings: Secondary | ICD-10-CM | POA: Diagnosis not present

## 2016-12-16 ENCOUNTER — Telehealth (HOSPITAL_COMMUNITY): Payer: Self-pay

## 2016-12-16 NOTE — Telephone Encounter (Signed)
Left message for pt's wife to return call. AW

## 2016-12-22 ENCOUNTER — Telehealth (HOSPITAL_COMMUNITY): Payer: Self-pay

## 2016-12-22 NOTE — Telephone Encounter (Signed)
Called to schedule procedure, left message for wife to return call. AW

## 2016-12-30 ENCOUNTER — Other Ambulatory Visit: Payer: Self-pay | Admitting: Cardiovascular Disease

## 2016-12-30 NOTE — Telephone Encounter (Signed)
Request received for Eliquis 51m; pt is 83yrs old, Crea-1.48 on 12/11/16, Wt-110.6kg, age-83 yrs old, pt last seen by Dr. NAcie Fredricksonon 11/14/16. Dosing criteria met.

## 2016-12-31 ENCOUNTER — Ambulatory Visit (INDEPENDENT_AMBULATORY_CARE_PROVIDER_SITE_OTHER): Payer: Medicare Other | Admitting: *Deleted

## 2016-12-31 ENCOUNTER — Telehealth: Payer: Self-pay | Admitting: Cardiology

## 2016-12-31 DIAGNOSIS — I442 Atrioventricular block, complete: Secondary | ICD-10-CM | POA: Diagnosis not present

## 2016-12-31 NOTE — Telephone Encounter (Signed)
Confirmed remote transmission w/ pt wife.   

## 2016-12-31 NOTE — Progress Notes (Signed)
Remote pacemaker transmission.   

## 2017-01-02 ENCOUNTER — Encounter: Payer: Self-pay | Admitting: Cardiology

## 2017-01-02 LAB — CUP PACEART REMOTE DEVICE CHECK
Battery Impedance: 139 Ohm
Battery Voltage: 2.79 V
Brady Statistic AP VS Percent: 19 %
Brady Statistic AS VP Percent: 7 %
Brady Statistic AS VS Percent: 71 %
Date Time Interrogation Session: 20180509182557
Implantable Lead Implant Date: 20151010
Implantable Lead Implant Date: 20151010
Implantable Lead Location: 753860
Implantable Lead Model: 5076
Implantable Pulse Generator Implant Date: 20151010
Lead Channel Impedance Value: 816 Ohm
Lead Channel Pacing Threshold Pulse Width: 0.4 ms
Lead Channel Pacing Threshold Pulse Width: 0.4 ms
Lead Channel Sensing Intrinsic Amplitude: 0.7 mV
Lead Channel Sensing Intrinsic Amplitude: 16 mV
Lead Channel Setting Pacing Amplitude: 2 V
Lead Channel Setting Sensing Sensitivity: 5.6 mV
MDC IDC LEAD LOCATION: 753859
MDC IDC MSMT BATTERY REMAINING LONGEVITY: 145 mo
MDC IDC MSMT LEADCHNL RA IMPEDANCE VALUE: 419 Ohm
MDC IDC MSMT LEADCHNL RA PACING THRESHOLD AMPLITUDE: 0.5 V
MDC IDC MSMT LEADCHNL RV PACING THRESHOLD AMPLITUDE: 0.625 V
MDC IDC SET LEADCHNL RV PACING AMPLITUDE: 2.5 V
MDC IDC SET LEADCHNL RV PACING PULSEWIDTH: 0.4 ms
MDC IDC STAT BRADY AP VP PERCENT: 3 %

## 2017-01-07 DIAGNOSIS — S22000D Wedge compression fracture of unspecified thoracic vertebra, subsequent encounter for fracture with routine healing: Secondary | ICD-10-CM | POA: Diagnosis not present

## 2017-01-07 DIAGNOSIS — I10 Essential (primary) hypertension: Secondary | ICD-10-CM | POA: Diagnosis not present

## 2017-01-07 DIAGNOSIS — Z6830 Body mass index (BMI) 30.0-30.9, adult: Secondary | ICD-10-CM | POA: Diagnosis not present

## 2017-01-16 DIAGNOSIS — E538 Deficiency of other specified B group vitamins: Secondary | ICD-10-CM | POA: Diagnosis not present

## 2017-01-16 DIAGNOSIS — M4857XA Collapsed vertebra, not elsewhere classified, lumbosacral region, initial encounter for fracture: Secondary | ICD-10-CM | POA: Diagnosis not present

## 2017-01-16 DIAGNOSIS — Z6829 Body mass index (BMI) 29.0-29.9, adult: Secondary | ICD-10-CM | POA: Diagnosis not present

## 2017-01-16 DIAGNOSIS — M4854XA Collapsed vertebra, not elsewhere classified, thoracic region, initial encounter for fracture: Secondary | ICD-10-CM | POA: Diagnosis not present

## 2017-01-22 ENCOUNTER — Ambulatory Visit (HOSPITAL_COMMUNITY)
Admission: RE | Admit: 2017-01-22 | Discharge: 2017-01-22 | Disposition: A | Discharge status: Home / Self Care | Payer: Medicare Other | Source: Ambulatory Visit | Attending: Interventional Radiology | Admitting: Interventional Radiology

## 2017-01-22 DIAGNOSIS — M4850XA Collapsed vertebra, not elsewhere classified, site unspecified, initial encounter for fracture: Principal | ICD-10-CM

## 2017-01-22 DIAGNOSIS — M4854XA Collapsed vertebra, not elsewhere classified, thoracic region, initial encounter for fracture: Secondary | ICD-10-CM | POA: Diagnosis not present

## 2017-01-22 DIAGNOSIS — IMO0001 Reserved for inherently not codable concepts without codable children: Secondary | ICD-10-CM

## 2017-01-22 HISTORY — PX: IR RADIOLOGIST EVAL & MGMT: IMG5224

## 2017-01-26 ENCOUNTER — Encounter (HOSPITAL_COMMUNITY): Payer: Self-pay | Admitting: Interventional Radiology

## 2017-01-26 ENCOUNTER — Other Ambulatory Visit (HOSPITAL_COMMUNITY): Payer: Self-pay | Admitting: Interventional Radiology

## 2017-01-26 DIAGNOSIS — IMO0001 Reserved for inherently not codable concepts without codable children: Secondary | ICD-10-CM

## 2017-01-26 DIAGNOSIS — M4850XA Collapsed vertebra, not elsewhere classified, site unspecified, initial encounter for fracture: Principal | ICD-10-CM

## 2017-01-27 DIAGNOSIS — R8290 Unspecified abnormal findings in urine: Secondary | ICD-10-CM | POA: Diagnosis not present

## 2017-01-27 DIAGNOSIS — Z6829 Body mass index (BMI) 29.0-29.9, adult: Secondary | ICD-10-CM | POA: Diagnosis not present

## 2017-01-27 DIAGNOSIS — I10 Essential (primary) hypertension: Secondary | ICD-10-CM | POA: Diagnosis not present

## 2017-01-27 DIAGNOSIS — R410 Disorientation, unspecified: Secondary | ICD-10-CM | POA: Diagnosis not present

## 2017-01-27 DIAGNOSIS — E038 Other specified hypothyroidism: Secondary | ICD-10-CM | POA: Diagnosis not present

## 2017-01-27 DIAGNOSIS — I5021 Acute systolic (congestive) heart failure: Secondary | ICD-10-CM | POA: Diagnosis not present

## 2017-01-27 DIAGNOSIS — D6489 Other specified anemias: Secondary | ICD-10-CM | POA: Diagnosis not present

## 2017-01-27 DIAGNOSIS — I480 Paroxysmal atrial fibrillation: Secondary | ICD-10-CM | POA: Diagnosis not present

## 2017-01-27 DIAGNOSIS — E1122 Type 2 diabetes mellitus with diabetic chronic kidney disease: Secondary | ICD-10-CM | POA: Diagnosis not present

## 2017-01-27 DIAGNOSIS — R413 Other amnesia: Secondary | ICD-10-CM | POA: Diagnosis not present

## 2017-01-29 ENCOUNTER — Other Ambulatory Visit: Payer: Self-pay | Admitting: Radiology

## 2017-01-29 ENCOUNTER — Encounter: Payer: Self-pay | Admitting: Cardiovascular Disease

## 2017-01-29 ENCOUNTER — Ambulatory Visit (INDEPENDENT_AMBULATORY_CARE_PROVIDER_SITE_OTHER): Payer: Medicare Other | Admitting: Cardiovascular Disease

## 2017-01-29 VITALS — BP 122/76 | HR 87 | Ht 75.0 in | Wt 232.4 lb

## 2017-01-29 DIAGNOSIS — I5032 Chronic diastolic (congestive) heart failure: Secondary | ICD-10-CM

## 2017-01-29 DIAGNOSIS — I480 Paroxysmal atrial fibrillation: Secondary | ICD-10-CM | POA: Diagnosis not present

## 2017-01-29 MED ORDER — FUROSEMIDE 40 MG PO TABS: ORAL_TABLET | ORAL | 3 refills | Status: DC

## 2017-01-29 MED ORDER — POTASSIUM CHLORIDE ER 10 MEQ PO TBCR: EXTENDED_RELEASE_TABLET | ORAL | 3 refills | Status: DC

## 2017-01-29 NOTE — Progress Notes (Signed)
Cardiology Office Note   Date:  01/29/2017   ID:  Richard [name], DOB 1931/01/25, MRN [id number]  PCP:  Prince Solian, MD  Cardiologist:   Mertie Moores, MD   Chief Complaint  Patient presents with  . Follow-up    Problem List 1. Atrial fib 2. Essential Hypertension 3. Microcytic anemia  4. Pacer 5, diabetes Mellitus    Jan 17, 2015:  Richard [name] is a 83 y.o. male who presents for  His atrial fib Having lots of problems with anemia.  No blood in stool Being set up to see a hematologist  Still eating salt frequently  Nov. 22, 2016:    Doing well from a cardiac standpoint. Having back pain . , had an injection in his back . Having problems with anemia   February 01, 2016:  Doing great from a cardiac standpoint Is falling frequently .   Is very weak Has been seen by EP - thought not to be related to bradycardia or hypotension .  Possibly dehydration Legs are wobbly  Discussed orthostasis - his symptoms do not sound like orthstasis   Dec. 5, 2017:  Doing well. No CP or dyspnea Still falling on occasion. Fell Thanksgiving night.   [place] off his riding lawnmower yesterday.  Is a poor candidate for chronic anticoagulation.  Is on Eliquis currently.   Was seen by Dr. Rayann Heman, was set up to begin eval for a Watchman.   He was scheduled to have a TEE  But was not able to go because of generalized weakness.   November 14, 2016: Still very unsteady .  Fell several weeks ago , hurt his back  No CP or dyspnea.   Had some shortness of breath on one office visit with [facility].  Was started on Lasix PRN .  Echo in Sept.  - normal EF of 65% .   Sleep with home home O2 with his CPAP.   January 29, 2017  Richard [name] is seen again today  Is having more dementia. Has chronic diastolic CHF Was in the hospital in April 19 with possible volume depletion .    Past Medical History:  Diagnosis Date  . Anxiety   . Arthritis   . Atrial fibrillation ([place])    a. Dx 03/2013, notes  report atrial fibrillation/atrial flutter, placed on amiodarone. NSR in subsequent OV's.  . B12 deficiency anemia   . Chest pain    a. H/o CTA negative for PE 2012, normal cath 2005, normal nucs previously including 05/2012.  Marland Kitchen Complete heart block (HCC)    a. s/p Medtronic Adapta L model ADDRL 1 (serial number [id number]) pacemaker.  Marland Kitchen GERD (gastroesophageal reflux disease)   . Hiatal hernia   . Hyperlipidemia   . Hypertension   . Iron deficiency anemia   . LBBB (left bundle branch block)   . Microcytic anemia   . Nephrolithiasis   . On home oxygen therapy    a. 2L w/CPAP at night  . On home oxygen therapy    "w/his CPAP at night; don't know how much" (05/21/2016)  . Orthostatic hypotension   . OSA on CPAP   . [place] spotted fever ~ 1945  . Sinus drainage   . Skin cancer of lip   . Stroke ([place])   . Type II diabetes mellitus ([place])     Past Surgical History:  Procedure Laterality Date  . CARDIAC CATHETERIZATION     by Dr. Acie Fredrickson, January 24, 2004, that  shows minimal coronary artery irregularities and normal left ventricular function  . CATARACT EXTRACTION W/ INTRAOCULAR LENS  IMPLANT, BILATERAL Bilateral   . COLONOSCOPY WITH PROPOFOL N/A 05/23/2016   Procedure: COLONOSCOPY WITH PROPOFOL;  Surgeon: Jerene Bears, MD;  Location: [facility];  Service: Gastroenterology;  Laterality: N/A;  . ENTEROSCOPY N/A 06/02/2016   Procedure: ENTEROSCOPY;  Surgeon: Gatha Mayer, MD;  Location: [facility] ENDOSCOPY;  Service: Endoscopy;  Laterality: N/A;  . ESOPHAGOGASTRODUODENOSCOPY N/A 05/22/2016   Procedure: ESOPHAGOGASTRODUODENOSCOPY (EGD);  Surgeon: Jerene Bears, MD;  Location[facility] ENDOSCOPY;  Service: Endoscopy;  Laterality: N/A;  . ESOPHAGOGASTRODUODENOSCOPY (EGD) WITH PROPOFOL N/A 06/21/2015   Procedure: ESOPHAGOGASTRODUODENOSCOPY (EGD) WITH PROPOFOL;  Surgeon: Gatha Mayer, MD;  Location: [facility] ENDOSCOPY;  Service: Endoscopy;  Laterality: N/A;  . GIVENS CAPSULE STUDY N/A 05/23/2016    Procedure: GIVENS CAPSULE STUDY;  Surgeon: Jerene Bears, MD;  Location: [facility];  Service: Gastroenterology;  Laterality: N/A;  . HOT HEMOSTASIS N/A 06/02/2016   Procedure: HOT HEMOSTASIS (ARGON PLASMA COAGULATION/BICAP);  Surgeon: Gatha Mayer, MD;  Location: [facility];  Service: Endoscopy;  Laterality: N/A;  . INGUINAL HERNIA REPAIR Right   . IR RADIOLOGIST EVAL & MGMT  11/25/2016  . IR RADIOLOGIST EVAL & MGMT  01/22/2017  . IR VERTEBROPLASTY EA ADDL (T&LS) BX INC UNI/BIL INC INJECT/IMAGING  11/28/2016  . IR VERTEBROPLASTY LUMBAR BX INC UNI/BIL INC/INJECT/IMAGING  11/28/2016  . LUMBAR DISC SURGERY  ~ 1993  . PERMANENT PACEMAKER INSERTION N/A 06/03/2014   MDT Adapta L implanted by Dr Rayann Heman for syncope and transient AV block  . SKIN CANCER EXCISION     "lower lip" (04/06/2013)  . TEMPORARY PACEMAKER INSERTION N/A 05/31/2014   Procedure: TEMPORARY WIRE;  Surgeon: Sinclair Grooms, MD;  Location: [facility] CATH LAB;  Service: Cardiovascular;  Laterality: N/A;  . VASECTOMY     Hx of      Current Outpatient Prescriptions  Medication Sig Dispense Refill  . acetaminophen (TYLENOL) 500 MG tablet Take 500-1,000 mg by mouth 2 (two) times daily.     Marland Kitchen amiodarone (PACERONE) 200 MG tablet Take 200 mg by mouth 3 (three) times a week.    Marland Kitchen atorvastatin (LIPITOR) 40 MG tablet Take 1 tablet (40 mg total) by mouth daily at 6 PM. 30 tablet 0  . cyanocobalamin (,VITAMIN B-12,) 1000 MCG/ML injection Inject 1,000 mcg into the muscle every 30 (thirty) days.    . diazepam (VALIUM) 5 MG tablet Take 5 mg by mouth at bedtime.     . diclofenac (FLECTOR) 1.3 % PTCH Place 1 patch onto the skin 2 (two) times daily as needed.    Marland Kitchen ELIQUIS 5 MG TABS tablet TAKE 1 TABLET (5 MG TOTAL) BY MOUTH 2 (TWO) TIMES DAILY. 60 tablet 6  . fluticasone (FLONASE) 50 MCG/ACT nasal spray Place 1 spray into the nose daily as needed for allergies or rhinitis.     . furosemide (LASIX) 40 MG tablet Take 1 tablet (40 mg total) by mouth every  other day. 30 tablet 11  . MENTHOL, TOPICAL ANALGESIC, EX Apply 1 application topically daily as needed (muscle pain).    . metoprolol (LOPRESSOR) 50 MG tablet TAKE 1 TABLET BY MOUTH TWICE A DAY 60 tablet 11  . pantoprazole (PROTONIX) 40 MG tablet Take 1 tablet (40 mg total) by mouth daily at 6 (six) AM. 30 tablet 1  . potassium chloride (K-DUR) 10 MEQ tablet Take 1 tablet (10 mEq total) by mouth every other day.  30 tablet 11  . sertraline (ZOLOFT) 100 MG tablet Take 100 mg by mouth daily.     . traMADol (ULTRAM) 50 MG tablet Take 50 mg by mouth 4 (four) times daily as needed for moderate pain.      No current facility-administered medications for this visit.    Facility-Administered Medications Ordered in Other Visits  Medication Dose Route Frequency Provider Last Rate Last Dose  . 0.9 %  sodium chloride infusion   Intravenous Continuous Monia Sabal, PA-C        Allergies:   Codeine and Citalopram    Social History:  The patient  reports that he has quit smoking. His smoking use included Cigarettes. He smoked 0.00 packs per day for 20.00 years. He has never used smokeless tobacco. He reports that he does not drink alcohol or use drugs.   Family History:  The patient's family history includes Breast cancer in his sister; Pneumonia in his father; Stroke in his brother; Thyroid disease in his mother.    ROS:  Please see the history of present illness.    Review of Systems: Constitutional:  denies fever, chills, diaphoresis, appetite change and fatigue.  HEENT: denies photophobia, eye pain, redness, hearing loss, ear pain, congestion, sore throat, rhinorrhea, sneezing, neck pain, neck stiffness and tinnitus.  Respiratory: denies SOB, DOE, cough, chest tightness, and wheezing.  Cardiovascular: denies chest pain, palpitations and leg swelling.  Gastrointestinal: denies nausea, vomiting, abdominal pain, diarrhea, constipation, blood in stool.  Genitourinary: denies dysuria, urgency,  frequency, hematuria, flank pain and difficulty urinating.  Musculoskeletal: denies  myalgias, back pain, joint swelling, arthralgias and gait problem.   Skin: denies pallor, rash and wound.  Neurological: denies dizziness, seizures, syncope, weakness, light-headedness, numbness and headaches.   Hematological: denies adenopathy, easy bruising, personal or family bleeding history.  Psychiatric/ Behavioral: denies suicidal ideation, mood changes, confusion, nervousness, sleep disturbance and agitation.       All other systems are reviewed and negative.    PHYSICAL EXAM: VS:  BP 122/76   Pulse 87   Ht 6\' 3"  (1.905 m)   Wt 232 lb 6 oz (105.4 kg)   SpO2 93%   BMI 29.04 kg/m  , BMI Body mass index is 29.04 kg/m. GEN: Well nourished, well developed, in no acute distress  HEENT: normal  Neck: no JVD, carotid bruits, or masses Cardiac: RRR; no murmurs, rubs, or gallops,no edema  Respiratory:  clear to auscultation bilaterally, normal work of breathing GI: soft, nontender, nondistended, + BS MS: no deformity or atrophy  Skin: warm and dry, no rash Neuro:   Very weak,   Needed assistance getting up on the table  Has lots of back pain .  Psych: normal   EKG:  EKG is not ordered today.   Recent Labs: 12/11/2016: ALT 19; BUN 28; Creatinine, Ser 1.48; Hemoglobin 10.4; Platelets 279; Potassium 3.9; Sodium 136    Lipid Panel    Component Value Date/Time   CHOL 136 05/06/2016 0531   TRIG 70 05/06/2016 0531   HDL 38 (L) 05/06/2016 0531   CHOLHDL 3.6 05/06/2016 0531   VLDL 14 05/06/2016 0531   LDLCALC 84 05/06/2016 0531      Wt Readings from Last 3 Encounters:  01/29/17 232 lb 6 oz (105.4 kg)  11/28/16 257 lb (116.6 kg)  11/27/16 257 lb (116.6 kg)      Other studies Reviewed: Additional studies/ records that were reviewed today include: . Review of the above records demonstrates:  ASSESSMENT AND PLAN:   1.   Frequent falls.  Richard [name] is having frequent falling. He  states that his legs are very weak and he loses his balance very easily.    2. Atrial fib- his rate is well-controlled. He now has a pacemaker. He is currently on Eliquis.    We had tried to arrange for a watchman procedure but he has not been strong enough to get through the eval. At this point, I would continue with very conservative management.   3. Essential Hypertension - blood pressure is a little on the low side. We'll discontinue the isosorbide.  4. Pacer - followed by Dr. Rayann Heman   5, diabetes Mellitus  - followed by his medical doctor    Current medicines are reviewed at length with the patient today.  The patient does not have concerns regarding medicines.  The following changes have been made:  no change  Labs/ tests ordered today include:  No orders of the defined types were placed in this encounter.    Disposition:   FU with me in 6 months      Mertie Moores, MD  01/29/2017 [facility] [place], [place], [place]  [address] Phone: [phone]; Fax: [phone]

## 2017-01-29 NOTE — Patient Instructions (Signed)
Medication Instructions:  1) DECREASE Potassium 18mEq once daily on Monday and Thursday. 2) DECREASE Furosemide 40mg  to once daily on Monday and Thursday.  Labwork: None  Testing/Procedures: None  Follow-Up: Your physician wants you to follow-up in: 6 months with Dr. Acie Fredrickson.  You will receive a reminder letter in the mail two months in advance. If you don't receive a letter, please call our office to schedule the follow-up appointment.   Any Other Special Instructions Will Be Listed Below (If Applicable).     If you need a refill on your cardiac medications before your next appointment, please call your pharmacy.

## 2017-01-30 ENCOUNTER — Ambulatory Visit (HOSPITAL_COMMUNITY)
Admission: RE | Admit: 2017-01-30 | Discharge: 2017-01-30 | Disposition: A | Discharge status: Home / Self Care | Payer: Medicare Other | Source: Ambulatory Visit | Attending: Interventional Radiology | Admitting: Interventional Radiology

## 2017-01-30 ENCOUNTER — Other Ambulatory Visit (HOSPITAL_COMMUNITY): Payer: Self-pay | Admitting: Interventional Radiology

## 2017-01-30 DIAGNOSIS — G4733 Obstructive sleep apnea (adult) (pediatric): Secondary | ICD-10-CM | POA: Diagnosis not present

## 2017-01-30 DIAGNOSIS — I10 Essential (primary) hypertension: Secondary | ICD-10-CM | POA: Diagnosis not present

## 2017-01-30 DIAGNOSIS — Z8673 Personal history of transient ischemic attack (TIA), and cerebral infarction without residual deficits: Secondary | ICD-10-CM | POA: Insufficient documentation

## 2017-01-30 DIAGNOSIS — Z9981 Dependence on supplemental oxygen: Secondary | ICD-10-CM | POA: Diagnosis not present

## 2017-01-30 DIAGNOSIS — E785 Hyperlipidemia, unspecified: Secondary | ICD-10-CM | POA: Insufficient documentation

## 2017-01-30 DIAGNOSIS — Z950 Presence of cardiac pacemaker: Secondary | ICD-10-CM | POA: Diagnosis not present

## 2017-01-30 DIAGNOSIS — E119 Type 2 diabetes mellitus without complications: Secondary | ICD-10-CM | POA: Diagnosis not present

## 2017-01-30 DIAGNOSIS — IMO0001 Reserved for inherently not codable concepts without codable children: Secondary | ICD-10-CM

## 2017-01-30 DIAGNOSIS — Z7951 Long term (current) use of inhaled steroids: Secondary | ICD-10-CM | POA: Insufficient documentation

## 2017-01-30 DIAGNOSIS — I442 Atrioventricular block, complete: Secondary | ICD-10-CM | POA: Diagnosis not present

## 2017-01-30 DIAGNOSIS — Z87891 Personal history of nicotine dependence: Secondary | ICD-10-CM | POA: Insufficient documentation

## 2017-01-30 DIAGNOSIS — K219 Gastro-esophageal reflux disease without esophagitis: Secondary | ICD-10-CM | POA: Insufficient documentation

## 2017-01-30 DIAGNOSIS — I447 Left bundle-branch block, unspecified: Secondary | ICD-10-CM | POA: Diagnosis not present

## 2017-01-30 DIAGNOSIS — I4891 Unspecified atrial fibrillation: Secondary | ICD-10-CM | POA: Diagnosis not present

## 2017-01-30 DIAGNOSIS — I4892 Unspecified atrial flutter: Secondary | ICD-10-CM | POA: Insufficient documentation

## 2017-01-30 DIAGNOSIS — M4854XA Collapsed vertebra, not elsewhere classified, thoracic region, initial encounter for fracture: Secondary | ICD-10-CM | POA: Insufficient documentation

## 2017-01-30 DIAGNOSIS — M1990 Unspecified osteoarthritis, unspecified site: Secondary | ICD-10-CM | POA: Diagnosis not present

## 2017-01-30 DIAGNOSIS — M4850XA Collapsed vertebra, not elsewhere classified, site unspecified, initial encounter for fracture: Secondary | ICD-10-CM

## 2017-01-30 DIAGNOSIS — D519 Vitamin B12 deficiency anemia, unspecified: Secondary | ICD-10-CM | POA: Diagnosis not present

## 2017-01-30 DIAGNOSIS — F419 Anxiety disorder, unspecified: Secondary | ICD-10-CM | POA: Insufficient documentation

## 2017-01-30 DIAGNOSIS — Z7901 Long term (current) use of anticoagulants: Secondary | ICD-10-CM | POA: Insufficient documentation

## 2017-01-30 DIAGNOSIS — M546 Pain in thoracic spine: Secondary | ICD-10-CM | POA: Diagnosis not present

## 2017-01-30 HISTORY — PX: IR VERTEBROPLASTY CERV/THOR BX INC UNI/BIL INC/INJECT/IMAGING: IMG5515

## 2017-01-30 HISTORY — PX: IR VERTEBROPLASTY EA ADDL (T&LS) BX INC UNI/BIL INC INJECT/IMAGING: IMG5517

## 2017-01-30 LAB — BASIC METABOLIC PANEL
ANION GAP: 8 (ref 5–15)
BUN: 15 mg/dL (ref 6–20)
CALCIUM: 8.8 mg/dL — AB (ref 8.9–10.3)
CHLORIDE: 102 mmol/L (ref 101–111)
CO2: 26 mmol/L (ref 22–32)
Creatinine, Ser: 1.15 mg/dL (ref 0.61–1.24)
GFR calc non Af Amer: 57 mL/min — ABNORMAL LOW (ref >60)
GLUCOSE: 186 mg/dL — AB (ref 65–99)
POTASSIUM: 4 mmol/L (ref 3.5–5.1)
Sodium: 136 mmol/L (ref 135–145)

## 2017-01-30 LAB — GLUCOSE, CAPILLARY: Glucose-Capillary: 145 mg/dL — ABNORMAL HIGH (ref 65–99)

## 2017-01-30 LAB — CBC
HEMATOCRIT: 32 % — AB (ref 39.0–52.0)
HEMOGLOBIN: 9.6 g/dL — AB (ref 13.0–17.0)
MCH: 26.8 pg (ref 26.0–34.0)
MCHC: 30 g/dL (ref 30.0–36.0)
MCV: 89.4 fL (ref 78.0–100.0)
Platelets: 158 10*3/uL (ref 150–400)
RBC: 3.58 MIL/uL — ABNORMAL LOW (ref 4.22–5.81)
RDW: 17.9 % — ABNORMAL HIGH (ref 11.5–15.5)
WBC: 4.7 10*3/uL (ref 4.0–10.5)

## 2017-01-30 LAB — APTT: aPTT: 32 seconds (ref 24–36)

## 2017-01-30 LAB — PROTIME-INR
INR: 1.1
PROTHROMBIN TIME: 14.2 s (ref 11.4–15.2)

## 2017-01-30 MED ORDER — SODIUM CHLORIDE 0.9 % IV SOLN: INTRAVENOUS | Status: DC

## 2017-01-30 MED ORDER — MIDAZOLAM HCL 2 MG/2ML IJ SOLN
INTRAMUSCULAR | Status: AC | PRN
  Administered 2017-01-30 (×2): 1 mg via INTRAVENOUS

## 2017-01-30 MED ORDER — CEFAZOLIN SODIUM-DEXTROSE 2-4 GM/100ML-% IV SOLN
INTRAVENOUS | Status: AC
  Filled 2017-01-30: qty 100

## 2017-01-30 MED ORDER — MIDAZOLAM HCL 2 MG/2ML IJ SOLN
INTRAMUSCULAR | Status: AC
  Filled 2017-01-30: qty 2

## 2017-01-30 MED ORDER — TOBRAMYCIN SULFATE 1.2 G IJ SOLR
INTRAMUSCULAR | Status: AC | PRN
  Administered 2017-01-30: .0001 g via TOPICAL

## 2017-01-30 MED ORDER — BUPIVACAINE HCL (PF) 0.25 % IJ SOLN
INTRAMUSCULAR | Status: AC
  Filled 2017-01-30: qty 60

## 2017-01-30 MED ORDER — CEFAZOLIN SODIUM-DEXTROSE 2-4 GM/100ML-% IV SOLN
2.0000 g | Freq: Once | INTRAVENOUS | Status: AC
  Administered 2017-01-30: 2 g via INTRAVENOUS

## 2017-01-30 MED ORDER — HYDROMORPHONE HCL 1 MG/ML IJ SOLN
INTRAMUSCULAR | Status: AC
  Filled 2017-01-30: qty 1

## 2017-01-30 MED ORDER — SODIUM CHLORIDE 0.9 % IV SOLN: INTRAVENOUS | Status: AC

## 2017-01-30 MED ORDER — FENTANYL CITRATE (PF) 100 MCG/2ML IJ SOLN
INTRAMUSCULAR | Status: AC | PRN
  Administered 2017-01-30 (×2): 25 ug via INTRAVENOUS

## 2017-01-30 MED ORDER — TOBRAMYCIN SULFATE 1.2 G IJ SOLR
INTRAMUSCULAR | Status: AC
  Filled 2017-01-30: qty 1.2

## 2017-01-30 MED ORDER — GELATIN ABSORBABLE 12-7 MM EX MISC
CUTANEOUS | Status: AC
  Filled 2017-01-30: qty 1

## 2017-01-30 MED ORDER — FENTANYL CITRATE (PF) 100 MCG/2ML IJ SOLN
INTRAMUSCULAR | Status: AC
  Filled 2017-01-30: qty 2

## 2017-01-30 MED ORDER — BUPIVACAINE HCL (PF) 0.25 % IJ SOLN
INTRAMUSCULAR | Status: AC | PRN
  Administered 2017-01-30: 30 mL

## 2017-01-30 MED ORDER — IOPAMIDOL (ISOVUE-300) INJECTION 61%
INTRAVENOUS | Status: AC
  Administered 2017-01-30: 6 mL
  Filled 2017-01-30: qty 50

## 2017-01-30 NOTE — H&P (Signed)
Chief Complaint: Patient was seen in consultation today for T11/T12 compression fractures  Referring Physician(s): Ferne Reus, PA  Supervising Physician: Luanne Bras  Patient Status: [facility] - Out-pt  History of Present Illness: Richard [name] is a 83 y.o. male with past medical history of anxiety, arthritis, a fib, LBBB s/p pacemaker, RMSF, and DM2 who has had kyphoplasty for compression fractures at L1 and L2.  Patient presented to [facility] with back pain and weakness 12/11/16.  CT Lumbar Spine 12/11/16 showed: 1. Interval approximately 35% comminuted T11 vertebral body compression fracture with patchy sclerosis and no bony retropulsion. -T12-L1: Mild bony retropulsion without significant canal or foraminal stenosis. 2. Stable L1 and L2 vertebral compression deformities with interval vertebroplasty material. 3. Degenerative changes, as described above.  Patient unable to undergo MRI due to pacemaker.   Patient presents today for vertebroplasty/kyphoplasty of T11 and possibly T12. He has been NPO.  He last took his Elliquis 6/5 and has held since.  He presents today in his usual state of health.   Past Medical History:  Diagnosis Date  . Anxiety   . Arthritis   . Atrial fibrillation ([place])    a. Dx 03/2013, notes report atrial fibrillation/atrial flutter, placed on amiodarone. NSR in subsequent OV's.  . B12 deficiency anemia   . Chest pain    a. H/o CTA negative for PE 2012, normal cath 2005, normal nucs previously including 05/2012.  Marland Kitchen Complete heart block (HCC)    a. s/p Medtronic Adapta L model ADDRL 1 (serial number [id number]) pacemaker.  Marland Kitchen GERD (gastroesophageal reflux disease)   . Hiatal hernia   . Hyperlipidemia   . Hypertension   . Iron deficiency anemia   . LBBB (left bundle branch block)   . Microcytic anemia   . Nephrolithiasis   . On home oxygen therapy    a. 2L w/CPAP at night  . On home oxygen therapy    "w/his CPAP at night; don't know how  much" (05/21/2016)  . Orthostatic hypotension   . OSA on CPAP   . [place] spotted fever ~ 1945  . Sinus drainage   . Skin cancer of lip   . Stroke ([place])   . Type II diabetes mellitus ([place])     Past Surgical History:  Procedure Laterality Date  . CARDIAC CATHETERIZATION     by Dr. Acie Fredrickson, January 24, 2004, that shows minimal coronary artery irregularities and normal left ventricular function  . CATARACT EXTRACTION W/ INTRAOCULAR LENS  IMPLANT, BILATERAL Bilateral   . COLONOSCOPY WITH PROPOFOL N/A 05/23/2016   Procedure: COLONOSCOPY WITH PROPOFOL;  Surgeon: Jerene Bears, MD;  Location: [facility];  Service: Gastroenterology;  Laterality: N/A;  . ENTEROSCOPY N/A 06/02/2016   Procedure: ENTEROSCOPY;  Surgeon: Gatha Mayer, MD;  Location: [facility] ENDOSCOPY;  Service: Endoscopy;  Laterality: N/A;  . ESOPHAGOGASTRODUODENOSCOPY N/A 05/22/2016   Procedure: ESOPHAGOGASTRODUODENOSCOPY (EGD);  Surgeon: Jerene Bears, MD;  Location: [facility] ENDOSCOPY;  Service: Endoscopy;  Laterality: N/A;  . ESOPHAGOGASTRODUODENOSCOPY (EGD) WITH PROPOFOL N/A 06/21/2015   Procedure: ESOPHAGOGASTRODUODENOSCOPY (EGD) WITH PROPOFOL;  Surgeon: Gatha Mayer, MD;  Location: [facility] ENDOSCOPY;  Service: Endoscopy;  Laterality: N/A;  . GIVENS CAPSULE STUDY N/A 05/23/2016   Procedure: GIVENS CAPSULE STUDY;  Surgeon: Jerene Bears, MD;  Location: [place];  Service: Gastroenterology;  Laterality: N/A;  . HOT HEMOSTASIS N/A 06/02/2016   Procedure: HOT HEMOSTASIS (ARGON PLASMA COAGULATION/BICAP);  Surgeon: Gatha Mayer, MD;  Location: [facility] END[facility];  Service:  Endoscopy;  Laterality: N/A;  . INGUINAL HERNIA REPAIR Right   . IR RADIOLOGIST EVAL & MGMT  11/25/2016  . IR RADIOLOGIST EVAL & MGMT  01/22/2017  . IR VERTEBROPLASTY EA ADDL (T&LS) BX INC UNI/BIL INC INJECT/IMAGING  11/28/2016  . IR VERTEBROPLASTY LUMBAR BX INC UNI/BIL INC/INJECT/IMAGING  11/28/2016  . LUMBAR DISC SURGERY  ~ 1993  . PERMANENT PACEMAKER INSERTION N/A 06/03/2014   MDT  Adapta L implanted by Dr Rayann Heman for syncope and transient AV block  . SKIN CANCER EXCISION     "lower lip" (04/06/2013)  . TEMPORARY PACEMAKER INSERTION N/A 05/31/2014   Procedure: TEMPORARY WIRE;  Surgeon: Sinclair Grooms, MD;  Location: [facility] CATH LAB;  Service: Cardiovascular;  Laterality: N/A;  . VASECTOMY     Hx of     Allergies: Codeine and Citalopram  Medications: Prior to Admission medications   Medication Sig Start Date End Date Taking? Authorizing Provider  acetaminophen (TYLENOL) 500 MG tablet Take 500-1,000 mg by mouth 2 (two) times daily.    Yes [provider]  amiodarone (PACERONE) 200 MG tablet Take 200 mg by mouth 3 (three) times a week.   Yes [provider]  atorvastatin (LIPITOR) 40 MG tablet Take 1 tablet (40 mg total) by mouth daily at 6 PM. 05/16/16  Yes Love, Ivan Anchors, PA-C  cyanocobalamin (,VITAMIN B-12,) 1000 MCG/ML injection Inject 1,000 mcg into the muscle every 30 (thirty) days.   Yes [provider]  diazepam (VALIUM) 5 MG tablet Take 5 mg by mouth at bedtime.    Yes [provider]  diclofenac (FLECTOR) 1.3 % PTCH Place 1 patch onto the skin 2 (two) times daily as needed.   Yes [provider]  ELIQUIS 5 MG TABS tablet TAKE 1 TABLET (5 MG TOTAL) BY MOUTH 2 (TWO) TIMES DAILY. 12/30/16  Yes Nahser, Wonda Cheng, MD  furosemide (LASIX) 40 MG tablet Take one tablet daily on Monday and Thursday. 01/29/17  Yes Nahser, Wonda Cheng, MD  levothyroxine (SYNTHROID, LEVOTHROID) 25 MCG tablet Take 25 mcg by mouth daily before breakfast.   Yes [provider]  MENTHOL, TOPICAL ANALGESIC, EX Apply 1 application topically daily as needed (muscle pain).   Yes [provider]  metoprolol (LOPRESSOR) 50 MG tablet TAKE 1 TABLET BY MOUTH TWICE A DAY 07/07/16  Yes Nahser, Wonda Cheng, MD  pantoprazole (PROTONIX) 40 MG tablet Take 1 tablet (40 mg total) by mouth daily at 6 (six) AM. 05/25/16  Yes Kelvin Cellar, MD  potassium chloride  (K-DUR) 10 MEQ tablet Take one tablet by mouth daily on Monday and Thursday. 01/29/17  Yes Nahser, Wonda Cheng, MD  sertraline (ZOLOFT) 100 MG tablet Take 100 mg by mouth daily.  08/21/14  Yes [provider]  fluticasone (FLONASE) 50 MCG/ACT nasal spray Place 1 spray into the nose daily as needed for allergies or rhinitis.     [provider]     Family History  Problem Relation Age of Onset  . Thyroid disease Mother        goiter  . Pneumonia Father   . Other Unknown        Parents both died of old age; other conditions not known  . Stroke Brother   . Breast cancer Sister     Social History   Social History  . Marital status: Married    Spouse name: N/A  . Number of children: 2  . Years of education: N/A   Occupational History  .  retired    Social History Main Topics  . Smoking status: Former Smoker    Packs/day: 0.00    Years: 20.00    Types: Cigarettes  . Smokeless tobacco: Never Used     Comment: 04/06/2013 "quit smoking years ago; didn't smoke that much; probably 20 years; 1ppd"  . Alcohol use No  . Drug use: No  . Sexual activity: No   Other Topics Concern  . Not on file   Social History Narrative  . No narrative on file    Review of Systems  Constitutional: Negative for fatigue and fever.  Respiratory: Negative for cough and shortness of breath.   Cardiovascular: Negative for chest pain.  Gastrointestinal: Negative for abdominal pain.  Musculoskeletal: Positive for back pain.  Neurological: Positive for weakness.  Psychiatric/Behavioral: Negative for behavioral problems and confusion.    Vital Signs: BP (!) 154/77   Pulse (!) 59   Temp 97.5 F (36.4 C) (Oral)   Resp 18   Ht 6\' 3"  (1.905 m)   Wt 232 lb (105.2 kg)   SpO2 95%   BMI 29.00 kg/m   Physical Exam  Constitutional: He is oriented to person, place, and time. He appears well-developed.  Cardiovascular: Normal rate, regular rhythm and normal heart sounds.   Pulmonary/Chest:  Effort normal and breath sounds normal. No respiratory distress.  Abdominal: Soft.  Neurological: He is alert and oriented to person, place, and time.  Skin: Skin is warm and dry.  Psychiatric: He has a normal mood and affect. His behavior is normal. Judgment and thought content normal.  Nursing note and vitals reviewed.   Mallampati Score:  MD Evaluation Airway: WNL Heart: WNL Abdomen: WNL Chest/ Lungs: WNL ASA  Classification: 3 Mallampati/Airway Score: Two  Imaging: Ir Radiologist Eval & Mgmt  Result Date: 01/26/2017 EXAM: ESTABLISHED PATIENT OFFICE VISIT CHIEF COMPLAINT: New worsening low back pain due to compression fracture at T11 and possibly T12. Current Pain Level: 1-10 HISTORY OF PRESENT ILLNESS: The patient is an 83 year old right-handed gentleman who returns in follow-up accompanied by his wife. According to them, following his initial vertebral body augmentation at L1, and L2 on 11/27/2016, the patient had significant relief of his pain. However, there continued to be a baseline background pain. This apparently has become more noticeable and severe. This pain apparently is severe enough for the patient to be significantly impaired in terms of ambulation difficulties. The worsening of the pain resulted in a follow-up MRI scan of the thoracic spine which demonstrated a worsening fracture at T11, and also possibly the superior endplate at Y30. According to the patient, and the spouse, the patient is unable to stand or walk even with a walker. He describes this pain as a 7 out of 10 most severe when he turns, stoops or bends. He believes his pain is severe enough that he requires tramadol. However, this causes the patient to become confused with disorientation prompting the wife to stop the medication. He denies radiation of pain in the lower extremities or circumferentially. He denies autonomic dysfunction of his bowel or bladder activities. He denies recent chills, fever or rigors. His  appetite, however, has improved. His weight is steady. According to his wife, because of the pain, the patient is more or less limited to sitting at home. Denies any UTI symptoms of dysuria, hematuria or polyuria. Denies recent chest pain, shortness of breath or of productive cough or wheezing. He also denies any abdominal pain, constipation, diarrhea or bloody stools. His past  medical history, surgical history and family history as per recent history and physical. His present medications are: Tylenol, amiodarone, Lipitor, vitamin B12 injections, diazepam, Eliquis, Flonase nasal spray, furosemide, menthol topical analgesics, metoprolol, Protonix, potassium chloride, Zoloft, tramadol which has now been stopped. Allergies: Codeine and citalopram. REVIEW OF SYSTEMS: Negative unless as mentioned above. PHYSICAL EXAMINATION: Appears in obvious significant pain.  In moderate distress. Affect appropriate for patient's clinical situation. Neurologically, otherwise, intact. No lateralizing cranial nerve, motor or sensory abnormalities. Station and gait not tested. In terms of memory, the patient does exhibit difficulty with recent memory such as what he might have had for breakfast and also the dinner the night before. He is also disoriented to the month. The patient is significantly hard of hearing in both ears. The patient does not regularly wear his hearing aids. Clinically on palpation the patient demonstrated diffuse tenderness throughout the thoracic spine. No discrete focal areas of tenderness were elicited. ASSESSMENT AND PLAN: The recent MRI of the thoracic spine was reviewed with the patient and the patient's wife. Brought to their attention was the obvious fracture with worsening at T11. Also suspected is changes in the superior endplate region at N36 compared to the previous CT scan of the lumbosacral spine, and compared to the recent lumbosacral spine films that the patient had at an outside institution. The  option of vertebral body augmentation at T11 was presented to the patient and spouse. They are in agreement to proceed with the vertebral body augmentation at T11, and possibly T12 depending on the radiographic findings at the time of the treatment of the T11 vertebral body. The patient and spouse were informed that he would have to stop Eliquis for 48 hours prior to the procedure. In the meantime, he has been asked to ambulate the best he can using a walker and to avoid stooping or lifting as much as possible. Questions were answered to their satisfaction. They were asked to call should they have any concerns or questions. The patient and the wife leave with good understanding and agreement with the above management plan. Electronically Signed   By: Luanne Bras M.D.   On: 01/23/2017 18:34    Labs:  CBC:  Recent Labs  05/27/16 1356 11/27/16 0715 12/11/16 1349 01/30/17 0624  WBC 7.6 9.4 9.9 4.7  HGB 9.3* 10.6* 10.4* 9.6*  HCT 28.0* 34.4* 33.0* 32.0*  PLT 225.0 214 279 158    COAGS:  Recent Labs  05/05/16 1634 05/21/16 1226 11/27/16 0715 01/30/17 0624  INR 1.22 1.32 1.23 1.10  APTT 35 34 35 32    BMP:  Recent Labs  05/24/16 0624 11/27/16 0715 12/11/16 1349 01/30/17 0624  NA 139 136 136 136  K 3.7 4.0 3.9 4.0  CL 105 94* 100* 102  CO2 29 28 25 26   GLUCOSE 142* 184* 169* 186*  BUN 7 17 28* 15  CALCIUM 8.7* 9.2 9.0 8.8*  CREATININE 1.10 1.20 1.48* 1.15  GFRNONAA >60 54* 42* 57*  GFRAA >60 >60 48* >60    LIVER FUNCTION TESTS:  Recent Labs  05/09/16 0928 05/21/16 1226 05/22/16 0540 12/11/16 1349  BILITOT 0.6 0.5 0.9 1.0  AST 41 21 20 30   ALT 29 15* 15* 19  ALKPHOS 94 65 59 118  PROT 7.7 6.1* 6.0* 7.6  ALBUMIN 3.6 3.2* 3.0* 2.9*    TUMOR MARKERS: No results for input(s): AFPTM, CEA, CA199, CHROMGRNA in the last 8760 hours.  Assessment and Plan: Patient with back pain is found  to have compression fracture at T11, possibly T12 as well, as  demonstrated on recent CT imaging.  Patient has undergone kyphoplasty for L1 and L2 in the past with good relief.  He presents for procedure today.  He has been NPO and has appropriately held his blood thinners.  Risks and benefits discussed with the patient including, but not limited to education regarding the natural healing process of compression fractures without intervention, bleeding, infection, cement migration which may cause spinal cord damage, paralysis, pulmonary embolism or even death. All of the patient's questions were answered, patient is agreeable to proceed. Consent signed and in chart.   Thank you for this interesting consult.  I greatly enjoyed meeting Richard [name] and look forward to participating in their care.  A copy of this report was sent to the requesting provider on this date.  Electronically Signed: Docia Barrier, PA 01/30/2017, 8:01 AM   I spent a total of    15 Minutes in face to face in clinical consultation, greater than 50% of which was counseling/coordinating care for compression fractures

## 2017-01-30 NOTE — Sedation Documentation (Signed)
Patient is resting comfortably. 

## 2017-01-30 NOTE — Discharge Instructions (Addendum)
°Percutaneous Vertebroplasty, Care After °Refer to this sheet in the next few weeks. These instructions provide you with information on caring for yourself after your procedure. Your health care provider may also give you more specific instructions. Your treatment has been planned according to current medical practices, but problems sometimes occur. Call your health care provider if you have any problems or questions after your procedure. °What can I expect after the procedure? °· You may have discomfort in your back at the site of the procedure. °· You may have immediate relief of the back pain from the collapsed bones of the spine (compression fracture), or it may lessen over the next 2 days. °· Pain resulting from a percutaneous vertebroplasty will usually go away in 2-3 days. °Follow these instructions at home: °· Take pain medicine as directed by your health care provider. °· Keep the incision site dry and covered for 24 hours, or as told by your health care provider. Ask your health care provider when you may remove your bandage, as well as when you can bathe or shower. °· An ice pack can be placed on the incision site to help lessen pain and swelling after the procedure: °? Put ice in a plastic bag. °? Place a towel between your skin and the bag. °? Leave the ice on for 20 minutes, 2-3 times a day. °· Rest for 24 hours after the procedure or as told by your health care provider. °· Return to normal activity as told by your health care provider. °· Ask what type of stretching and strengthening exercises you should do after the procedure. °· Do not bend or lift anything heavy. Follow your health care provider's instructions about bending and lifting. °Contact a health care provider if: °· Your incision site becomes red, swollen, or tender to the touch. °· You have any type of bleeding or fluid leakage from the incision site. °· You become nauseous or vomit for more than 24 hours after the procedure. °· Your  back pain does not get better. °· You have a fever. °Get help right away if: °· You have sudden, severe back pain. °· You notice a change in your ability to control urination or bowel movements. °· You have tingling, numbness, or weakness in your legs or feet after the procedure that was not there before. °· You have sudden weakness in your arms or legs. °· You have shooting pain down your legs. °· You have chest pain, trouble breathing, or are short of breath. °· You feel dizzy or faint. °· You have changes in your speech or vision. °This information is not intended to replace advice given to you by your health care provider. Make sure you discuss any questions you have with your health care provider. °Document Released: 04/10/2011 Document Revised: 01/17/2016 Document Reviewed: 04/18/2013 °Elsevier Interactive Patient Education © 2017 [employer] ° ° °Balloon Kyphoplasty, Care After °Refer to this sheet in the next few weeks. These instructions provide you with information about caring for yourself after your procedure. Your health care provider may also give you more specific instructions. Your treatment has been planned according to current medical practices, but problems sometimes occur. Call your health care provider if you have any problems or questions after your procedure. °What can I expect after the procedure? °After your procedure, it is common to have back pain. °Follow these instructions at home: °Incision care °· Follow instructions from your health care provider about how to take care of your incisions. Make   sure you: °? Wash your hands with soap and water before you change your bandage (dressing). If soap and water are not available, use hand sanitizer. °? Change your dressing as told by your health care provider. °? Leave stitches (sutures), skin glue, or adhesive strips in place. These skin closures may need to be in place for 2 weeks or longer. If adhesive strip edges start to loosen and curl  up, you may trim the loose edges. Do not remove adhesive strips completely unless your health care provider tells you to do that. °· Check your incision area every day for signs of infection. Watch for: °? Redness, swelling, or pain. °? Fluid, blood, or pus. °· Keep your dressing dry until your health care provider says that it can be removed. °Activity ° °· Rest your back and avoid intense physical activity for as long as told by your health care provider. °· Return to your normal activities as told by your health care provider. Ask your health care provider what activities are safe for you. °· Do not lift anything that is heavier than 10 lb (4.5 kg). This is about the weight of a gallon of milk. You may need to avoid heavy lifting for several weeks. °General instructions °· Take over-the-counter and prescription medicines only as told by your health care provider. °· If directed, apply ice to the painful area: °? Put ice in a plastic bag. °? Place a towel between your skin and the bag. °? Leave the ice on for 20 minutes, 2-3 times per day. °· Do not use tobacco products, including cigarettes, chewing tobacco, or e-cigarettes. If you need help quitting, ask your health care provider. °· Keep all follow-up visits as told by your health care provider. This is important. °Contact a health care provider if: °· You have a fever. °· You have redness, swelling, or pain at the site of your incisions. °· You have fluid, blood, or pus coming from your incisions. °· You have pain that gets worse or does not get better with medicine. °· You develop numbness or weakness in any part of your body. °Get help right away if: °· You have chest pain. °· You have difficulty breathing. °· You cannot move your legs. °· You cannot control your bladder or bowel movements. °· You suddenly become weak or numb on one side of your body. °· You become very confused. °· You have trouble speaking or understanding, or both. °This information is  not intended to replace advice given to you by your health care provider. Make sure you discuss any questions you have with your health care provider. °Document Released: 05/02/2015 Document Revised: 01/17/2016 Document Reviewed: 12/04/2014 °Elsevier Interactive Patient Education © 2018 [employer] °Moderate Conscious Sedation, Adult, Care After °These instructions provide you with information about caring for yourself after your procedure. Your health care provider may also give you more specific instructions. Your treatment has been planned according to current medical practices, but problems sometimes occur. Call your health care provider if you have any problems or questions after your procedure. °What can I expect after the procedure? °After your procedure, it is common: °· To feel sleepy for several hours. °· To feel clumsy and have poor balance for several hours. °· To have poor judgment for several hours. °· To vomit if you eat too soon. ° °Follow these instructions at home: °For at least 24 hours after the procedure: ° °· Do not: °? Participate in activities where you could fall or   become injured. °? Drive. °? Use heavy machinery. °? Drink alcohol. °? Take sleeping pills or medicines that cause drowsiness. °? Make important decisions or sign legal documents. °? Take care of children on your own. °· Rest. °Eating and drinking °· Follow the diet recommended by your health care provider. °· If you vomit: °? Drink water, juice, or soup when you can drink without vomiting. °? Make sure you have little or no nausea before eating solid foods. °General instructions °· Have a responsible adult stay with you until you are awake and alert. °· Take over-the-counter and prescription medicines only as told by your health care provider. °· If you smoke, do not smoke without supervision. °· Keep all follow-up visits as told by your health care provider. This is important. °Contact a health care provider if: °· You keep  feeling nauseous or you keep vomiting. °· You feel light-headed. °· You develop a rash. °· You have a fever. °Get help right away if: °· You have trouble breathing. °This information is not intended to replace advice given to you by your health care provider. Make sure you discuss any questions you have with your health care provider. °Document Released: 06/01/2013 Document Revised: 01/14/2016 Document Reviewed: 12/01/2015 °Elsevier Interactive Patient Education © 2018 [employer] ° °

## 2017-01-30 NOTE — Procedures (Signed)
S/P T 11 and T 12 VP

## 2017-02-03 ENCOUNTER — Encounter (HOSPITAL_COMMUNITY): Payer: Self-pay | Admitting: Interventional Radiology

## 2017-02-13 DIAGNOSIS — R296 Repeated falls: Secondary | ICD-10-CM | POA: Diagnosis not present

## 2017-02-13 DIAGNOSIS — F418 Other specified anxiety disorders: Secondary | ICD-10-CM | POA: Diagnosis not present

## 2017-02-13 DIAGNOSIS — D62 Acute posthemorrhagic anemia: Secondary | ICD-10-CM | POA: Diagnosis not present

## 2017-02-13 DIAGNOSIS — I10 Essential (primary) hypertension: Secondary | ICD-10-CM | POA: Diagnosis not present

## 2017-02-13 DIAGNOSIS — I480 Paroxysmal atrial fibrillation: Secondary | ICD-10-CM | POA: Diagnosis not present

## 2017-02-13 DIAGNOSIS — R413 Other amnesia: Secondary | ICD-10-CM | POA: Diagnosis not present

## 2017-02-13 DIAGNOSIS — E538 Deficiency of other specified B group vitamins: Secondary | ICD-10-CM | POA: Diagnosis not present

## 2017-02-19 ENCOUNTER — Telehealth (HOSPITAL_COMMUNITY): Payer: Self-pay

## 2017-02-19 NOTE — Telephone Encounter (Signed)
Left message for pt's wife to return call. AW

## 2017-03-02 ENCOUNTER — Observation Stay (HOSPITAL_COMMUNITY)
Admission: EM | Admit: 2017-03-02 | Discharge: 2017-03-03 | Disposition: A | Discharge status: Home / Self Care | Payer: Medicare Other | Attending: Internal Medicine | Admitting: Internal Medicine

## 2017-03-02 ENCOUNTER — Encounter (HOSPITAL_COMMUNITY): Payer: Self-pay | Admitting: Emergency Medicine

## 2017-03-02 ENCOUNTER — Emergency Department (HOSPITAL_COMMUNITY): Payer: Medicare Other

## 2017-03-02 DIAGNOSIS — F0390 Unspecified dementia without behavioral disturbance: Secondary | ICD-10-CM | POA: Diagnosis not present

## 2017-03-02 DIAGNOSIS — D519 Vitamin B12 deficiency anemia, unspecified: Secondary | ICD-10-CM | POA: Diagnosis not present

## 2017-03-02 DIAGNOSIS — N189 Chronic kidney disease, unspecified: Secondary | ICD-10-CM | POA: Insufficient documentation

## 2017-03-02 DIAGNOSIS — I480 Paroxysmal atrial fibrillation: Secondary | ICD-10-CM | POA: Diagnosis not present

## 2017-03-02 DIAGNOSIS — D649 Anemia, unspecified: Secondary | ICD-10-CM | POA: Diagnosis not present

## 2017-03-02 DIAGNOSIS — S06300A Unspecified focal traumatic brain injury without loss of consciousness, initial encounter: Principal | ICD-10-CM | POA: Insufficient documentation

## 2017-03-02 DIAGNOSIS — W19XXXA Unspecified fall, initial encounter: Secondary | ICD-10-CM | POA: Diagnosis not present

## 2017-03-02 DIAGNOSIS — I447 Left bundle-branch block, unspecified: Secondary | ICD-10-CM | POA: Diagnosis not present

## 2017-03-02 DIAGNOSIS — I10 Essential (primary) hypertension: Secondary | ICD-10-CM | POA: Diagnosis present

## 2017-03-02 DIAGNOSIS — E119 Type 2 diabetes mellitus without complications: Secondary | ICD-10-CM

## 2017-03-02 DIAGNOSIS — I129 Hypertensive chronic kidney disease with stage 1 through stage 4 chronic kidney disease, or unspecified chronic kidney disease: Secondary | ICD-10-CM | POA: Insufficient documentation

## 2017-03-02 DIAGNOSIS — I4891 Unspecified atrial fibrillation: Secondary | ICD-10-CM | POA: Diagnosis not present

## 2017-03-02 DIAGNOSIS — K219 Gastro-esophageal reflux disease without esophagitis: Secondary | ICD-10-CM | POA: Insufficient documentation

## 2017-03-02 DIAGNOSIS — G4733 Obstructive sleep apnea (adult) (pediatric): Secondary | ICD-10-CM | POA: Diagnosis not present

## 2017-03-02 DIAGNOSIS — I619 Nontraumatic intracerebral hemorrhage, unspecified: Secondary | ICD-10-CM

## 2017-03-02 DIAGNOSIS — E1122 Type 2 diabetes mellitus with diabetic chronic kidney disease: Secondary | ICD-10-CM | POA: Diagnosis not present

## 2017-03-02 DIAGNOSIS — Z79899 Other long term (current) drug therapy: Secondary | ICD-10-CM | POA: Diagnosis not present

## 2017-03-02 DIAGNOSIS — E1159 Type 2 diabetes mellitus with other circulatory complications: Secondary | ICD-10-CM | POA: Diagnosis not present

## 2017-03-02 DIAGNOSIS — Z87891 Personal history of nicotine dependence: Secondary | ICD-10-CM | POA: Insufficient documentation

## 2017-03-02 DIAGNOSIS — I442 Atrioventricular block, complete: Secondary | ICD-10-CM | POA: Diagnosis not present

## 2017-03-02 DIAGNOSIS — R4189 Other symptoms and signs involving cognitive functions and awareness: Secondary | ICD-10-CM | POA: Diagnosis present

## 2017-03-02 DIAGNOSIS — E785 Hyperlipidemia, unspecified: Secondary | ICD-10-CM | POA: Insufficient documentation

## 2017-03-02 DIAGNOSIS — F419 Anxiety disorder, unspecified: Secondary | ICD-10-CM | POA: Insufficient documentation

## 2017-03-02 DIAGNOSIS — S0990XA Unspecified injury of head, initial encounter: Secondary | ICD-10-CM | POA: Diagnosis present

## 2017-03-02 DIAGNOSIS — S098XXA Other specified injuries of head, initial encounter: Secondary | ICD-10-CM | POA: Diagnosis not present

## 2017-03-02 DIAGNOSIS — S06360A Traumatic hemorrhage of cerebrum, unspecified, without loss of consciousness, initial encounter: Secondary | ICD-10-CM | POA: Diagnosis not present

## 2017-03-02 DIAGNOSIS — S0190XA Unspecified open wound of unspecified part of head, initial encounter: Secondary | ICD-10-CM | POA: Diagnosis not present

## 2017-03-02 DIAGNOSIS — Z8673 Personal history of transient ischemic attack (TIA), and cerebral infarction without residual deficits: Secondary | ICD-10-CM | POA: Insufficient documentation

## 2017-03-02 DIAGNOSIS — W1789XA Other fall from one level to another, initial encounter: Secondary | ICD-10-CM | POA: Insufficient documentation

## 2017-03-02 DIAGNOSIS — Z7901 Long term (current) use of anticoagulants: Secondary | ICD-10-CM | POA: Diagnosis not present

## 2017-03-02 DIAGNOSIS — Z950 Presence of cardiac pacemaker: Secondary | ICD-10-CM | POA: Diagnosis not present

## 2017-03-02 DIAGNOSIS — R061 Stridor: Secondary | ICD-10-CM | POA: Diagnosis not present

## 2017-03-02 DIAGNOSIS — I629 Nontraumatic intracranial hemorrhage, unspecified: Secondary | ICD-10-CM

## 2017-03-02 DIAGNOSIS — E876 Hypokalemia: Secondary | ICD-10-CM | POA: Diagnosis not present

## 2017-03-02 HISTORY — DX: Unspecified dementia, unspecified severity, without behavioral disturbance, psychotic disturbance, mood disturbance, and anxiety: F03.90

## 2017-03-02 HISTORY — DX: Presence of cardiac pacemaker: Z95.0

## 2017-03-02 LAB — PROTIME-INR
INR: 1.44
Prothrombin Time: 17.7 seconds — ABNORMAL HIGH (ref 11.4–15.2)

## 2017-03-02 LAB — CBC WITH DIFFERENTIAL/PLATELET
BASOS ABS: 0 10*3/uL (ref 0.0–0.1)
Basophils Relative: 0 %
Eosinophils Absolute: 0.1 10*3/uL (ref 0.0–0.7)
Eosinophils Relative: 2 %
HEMATOCRIT: 31.1 % — AB (ref 39.0–52.0)
Hemoglobin: 9.6 g/dL — ABNORMAL LOW (ref 13.0–17.0)
LYMPHS PCT: 15 %
Lymphs Abs: 0.9 10*3/uL (ref 0.7–4.0)
MCH: 27.3 pg (ref 26.0–34.0)
MCHC: 30.9 g/dL (ref 30.0–36.0)
MCV: 88.4 fL (ref 78.0–100.0)
MONO ABS: 0.5 10*3/uL (ref 0.1–1.0)
Monocytes Relative: 9 %
NEUTROS ABS: 4.4 10*3/uL (ref 1.7–7.7)
Neutrophils Relative %: 74 %
Platelets: 138 10*3/uL — ABNORMAL LOW (ref 150–400)
RBC: 3.52 MIL/uL — ABNORMAL LOW (ref 4.22–5.81)
RDW: 17.8 % — AB (ref 11.5–15.5)
WBC: 6 10*3/uL (ref 4.0–10.5)

## 2017-03-02 LAB — URINALYSIS, ROUTINE W REFLEX MICROSCOPIC
BILIRUBIN URINE: NEGATIVE
Bacteria, UA: NONE SEEN
Glucose, UA: NEGATIVE mg/dL
Ketones, ur: NEGATIVE mg/dL
LEUKOCYTES UA: NEGATIVE
NITRITE: NEGATIVE
PROTEIN: 30 mg/dL — AB
Specific Gravity, Urine: 1.006 (ref 1.005–1.030)
Squamous Epithelial / LPF: NONE SEEN
pH: 5 (ref 5.0–8.0)

## 2017-03-02 LAB — BASIC METABOLIC PANEL
ANION GAP: 9 (ref 5–15)
BUN: 13 mg/dL (ref 6–20)
CHLORIDE: 100 mmol/L — AB (ref 101–111)
CO2: 26 mmol/L (ref 22–32)
Calcium: 8.8 mg/dL — ABNORMAL LOW (ref 8.9–10.3)
Creatinine, Ser: 1.09 mg/dL (ref 0.61–1.24)
GFR calc Af Amer: >60 mL/min (ref >60)
GFR calc non Af Amer: >60 mL/min (ref >60)
GLUCOSE: 156 mg/dL — AB (ref 65–99)
POTASSIUM: 3.8 mmol/L (ref 3.5–5.1)
Sodium: 135 mmol/L (ref 135–145)

## 2017-03-02 MED ORDER — SODIUM CHLORIDE 0.9 % IV BOLUS (SEPSIS)
500.0000 mL | Freq: Once | INTRAVENOUS | Status: AC
  Administered 2017-03-02: 500 mL via INTRAVENOUS

## 2017-03-02 MED ORDER — CLONIDINE HCL 0.1 MG PO TABS
0.1000 mg | ORAL_TABLET | Freq: Once | ORAL | Status: AC
  Administered 2017-03-02: 0.1 mg via ORAL
  Filled 2017-03-02: qty 1

## 2017-03-02 MED ORDER — PROTHROMBIN COMPLEX CONC HUMAN 500 UNITS IV KIT
5000.0000 [IU] | PACK | Status: AC
  Administered 2017-03-03: 5000 [IU] via INTRAVENOUS
  Filled 2017-03-02: qty 200

## 2017-03-02 NOTE — ED Provider Notes (Addendum)
[facility] DEPT Provider Note   CSN: [id number] Arrival date & time: 03/02/17  1733     History   Chief Complaint Chief Complaint  Patient presents with  . Head Injury    HPI Richard [name] is a 83 y.o. male.  Level V caveat for mild dementia. Patient allegedly fell accidentally today while walking with his walker. He struck the top of his scalp. Wife reports slight confusion over the past 3-4 weeks. No specific fever, sweats, chills, cough, dysuria, chest pain, dyspnea. He is on Eliquis      Past Medical History:  Diagnosis Date  . Anxiety   . Arthritis   . Atrial fibrillation ([place])    a. Dx 03/2013, notes report atrial fibrillation/atrial flutter, placed on amiodarone. NSR in subsequent OV's.  . B12 deficiency anemia   . Chest pain    a. H/o CTA negative for PE 2012, normal cath 2005, normal nucs previously including 05/2012.  Marland Kitchen Complete heart block (HCC)    a. s/p Medtronic Adapta L model ADDRL 1 (serial number [id number]) pacemaker.  Marland Kitchen GERD (gastroesophageal reflux disease)   . Hiatal hernia   . Hyperlipidemia   . Hypertension   . Iron deficiency anemia   . LBBB (left bundle branch block)   . Microcytic anemia   . Nephrolithiasis   . On home oxygen therapy    a. 2L w/CPAP at night  . On home oxygen therapy    "w/his CPAP at night; don't know how much" (05/21/2016)  . Orthostatic hypotension   . OSA on CPAP   . [place] spotted fever ~ 1945  . Sinus drainage   . Skin cancer of lip   . Stroke ([place])   . Type II diabetes mellitus [facility])     Patient Active Problem List   Diagnosis Date Noted  . Chronic diastolic CHF (congestive heart failure) ([place]) 11/16/2016  . AVM (arteriovenous malformation) of small bowel, acquired ([place])   . Abnormality of gait as late effect of stroke 05/27/2016  . Benign neoplasm of cecum   . Benign neoplasm of sigmoid colon   . GI bleeding 05/22/2016  . Melena   . GIB (gastrointestinal bleeding) 05/21/2016  . Diabetes  mellitus with complication ([place])   . Upper GI bleed   . CKD (chronic kidney disease)   . Type 2 diabetes mellitus with circulatory disorder, without long-term current use of insulin ([place])   . Acute right PCA stroke ([place]) 05/08/2016  . Ataxia due to recent stroke   . Gait disturbance, post-stroke   . Cerebellar stroke, acute ([place]) 05/07/2016  . Hyperlipidemia   . Chronic anticoagulation   . Cardiac pacemaker in situ   . Acute blood loss anemia   . Cognitive deficits   . Cognitive deficit due to recent stroke   . Stroke-related cognitive dysfunction   . Generalized weakness 05/05/2016  . Dizziness   . Absolute anemia 02/08/2016  . Weakness 02/01/2016  . Gastric polyp 06/21/2015  . Iron deficiency anemia due to chronic blood loss   . GERD (gastroesophageal reflux disease)   . B12 deficiency anemia   . Bradycardia with less than 30 beats per minute 05/31/2014  . Complete heart block ([place]) 05/31/2014  . Syncope 05/31/2014  . Anemia 05/31/2014  . Atrial fibrillation ([place]) 04/08/2013  . Diabetes mellitus ([place]) 04/06/2013  . Hiatal hernia 04/06/2013  . Essential hypertension 06/13/2011  . OSA (obstructive sleep apnea) 06/13/2011    Past Surgical History:  Procedure Laterality  Date  . CARDIAC CATHETERIZATION     by Dr. Acie Fredrickson, January 24, 2004, that shows minimal coronary artery irregularities and normal left ventricular function  . CATARACT EXTRACTION W/ INTRAOCULAR LENS  IMPLANT, BILATERAL Bilateral   . COLONOSCOPY WITH PROPOFOL N/A 05/23/2016   Procedure: COLONOSCOPY WITH PROPOFOL;  Surgeon: Jerene Bears, MD;  Location: [facility];  Service: Gastroenterology;  Laterality: N/A;  . ENTEROSCOPY N/A 06/02/2016   Procedure: ENTEROSCOPY;  Surgeon: Gatha Mayer, MD;  Location: [facility] ENDOSCOPY;  Service: Endoscopy;  Laterality: N/A;  . ESOPHAGOGASTRODUODENOSCOPY N/A 05/22/2016   Procedure: ESOPHAGOGASTRODUODENOSCOPY (EGD);  Surgeon: Jerene Bears, MD;  Location[facility] ENDOSCOPY;  Service:  Endoscopy;  Laterality: N/A;  . ESOPHAGOGASTRODUODENOSCOPY (EGD) WITH PROPOFOL N/A 06/21/2015   Procedure: ESOPHAGOGASTRODUODENOSCOPY (EGD) WITH PROPOFOL;  Surgeon: Gatha Mayer, MD;  Location: [facility] ENDOSCOPY;  Service: Endoscopy;  Laterality: N/A;  . GIVENS CAPSULE STUDY N/A 05/23/2016   Procedure: GIVENS CAPSULE STUDY;  Surgeon: Jerene Bears, MD;  Location: [facility];  Service: Gastroenterology;  Laterality: N/A;  . HOT HEMOSTASIS N/A 06/02/2016   Procedure: HOT HEMOSTASIS (ARGON PLASMA COAGULATION/BICAP);  Surgeon: Gatha Mayer, MD;  Location: [facility] ENDOS[facility];  Service: Endoscopy;  Laterality: N/A;  . INGUINAL HERNIA REPAIR Right   . IR RADIOLOGIST EVAL & MGMT  11/25/2016  . IR RADIOLOGIST EVAL & MGMT  01/22/2017  . IR VERTEBROPLASTY CERV/THOR BX INC UNI/BIL INC/INJECT/IMAGING  01/30/2017  . IR VERTEBROPLASTY EA ADDL (T&LS) BX INC UNI/BIL INC INJECT/IMAGING  11/28/2016  . IR VERTEBROPLASTY EA ADDL (T&LS) BX INC UNI/BIL INC INJECT/IMAGING  01/30/2017  . IR VERTEBROPLASTY LUMBAR BX INC UNI/BIL INC/INJECT/IMAGING  11/28/2016  . LUMBAR DISC SURGERY  ~ 1993  . PERMANENT PACEMAKER INSERTION N/A 06/03/2014   MDT Adapta L implanted by Dr Rayann Heman for syncope and transient AV block  . SKIN CANCER EXCISION     "lower lip" (04/06/2013)  . TEMPORARY PACEMAKER INSERTION N/A 05/31/2014   Procedure: TEMPORARY WIRE;  Surgeon: Sinclair Grooms, MD;  Location: [facility] C[facility] LAB;  Service: Cardiovascular;  Laterality: N/A;  . VASECTOMY     Hx of     OB History    No data available       Home Medications    Prior to Admission medications   Medication Sig Start Date End Date Taking? Authorizing Provider  acetaminophen (TYLENOL) 500 MG tablet Take 500-1,000 mg by mouth 2 (two) times daily.    Yes [provider]  amiodarone (PACERONE) 200 MG tablet Take 200 mg by mouth 3 (three) times a week.   Yes [provider]  atorvastatin (LIPITOR) 40 MG tablet Take 1 tablet (40 mg total) by mouth daily at 6  PM. 05/16/16  Yes Love, Ivan Anchors, PA-C  cyanocobalamin (,VITAMIN B-12,) 1000 MCG/ML injection Inject 1,000 mcg into the muscle every 30 (thirty) days.   Yes [provider]  diazepam (VALIUM) 5 MG tablet Take 5 mg by mouth at bedtime.    Yes [provider]  ELIQUIS 5 MG TABS tablet TAKE 1 TABLET (5 MG TOTAL) BY MOUTH 2 (TWO) TIMES DAILY. 12/30/16  Yes Nahser, Wonda Cheng, MD  fluticasone (FLONASE) 50 MCG/ACT nasal spray Place 1 spray into the nose daily as needed for allergies or rhinitis.    Yes [provider]  furosemide (LASIX) 40 MG tablet Take one tablet daily on Monday and Thursday. 01/29/17  Yes Nahser, Wonda Cheng, MD  levothyroxine (SYNTHROID, LEVOTHROID) 25 MCG tablet Take 25 mcg by  mouth daily before breakfast.   Yes [provider]  MENTHOL, TOPICAL ANALGESIC, EX Apply 1 application topically daily as needed (muscle pain).   Yes [provider]  metoprolol (LOPRESSOR) 50 MG tablet TAKE 1 TABLET BY MOUTH TWICE A DAY 07/07/16  Yes Nahser, Wonda Cheng, MD  pantoprazole (PROTONIX) 40 MG tablet Take 1 tablet (40 mg total) by mouth daily at 6 (six) AM. 05/25/16  Yes Kelvin Cellar, MD  potassium chloride (K-DUR) 10 MEQ tablet Take one tablet by mouth daily on Monday and Thursday. 01/29/17  Yes Nahser, Wonda Cheng, MD  sertraline (ZOLOFT) 100 MG tablet Take 100 mg by mouth daily.  08/21/14  Yes [provider]    Family History Family History  Problem Relation Age of Onset  . Thyroid disease Mother        goiter  . Pneumonia Father   . Other Unknown        Parents both died of old age; other conditions not known  . Stroke Brother   . Breast cancer Sister     Social History Social History  Substance Use Topics  . Smoking status: Former Smoker    Packs/day: 0.00    Years: 20.00    Types: Cigarettes  . Smokeless tobacco: Never Used     Comment: 04/06/2013 "quit smoking years ago; didn't smoke that much; probably 20 years; 1ppd"  . Alcohol use  No     Allergies   Codeine and Citalopram   Review of Systems Review of Systems  Unable to perform ROS: Dementia (mild dementia)     Physical Exam Updated Vital Signs BP (!) 181/93   Pulse 76   Temp 98 F (36.7 C) (Oral)   Resp 19   Ht 6\' 3"  (1.905 m)   Wt 104.3 kg (230 lb)   SpO2 95%   BMI 28.75 kg/m   Physical Exam  Constitutional: He is oriented to person, place, and time. He appears well-developed and well-nourished.  HENT:  Head: Normocephalic.  Contusion, hematoma top of scalp posteriorally  Eyes: Conjunctivae are normal.  Neck: Neck supple.  Cardiovascular: Normal rate and regular rhythm.   Pulmonary/Chest: Effort normal and breath sounds normal.  Abdominal: Soft. Bowel sounds are normal.  Musculoskeletal: Normal range of motion.  Neurological: He is alert and oriented to person, place, and time.  Skin: Skin is warm and dry.  Psychiatric: He has a normal mood and affect. His behavior is normal.  Nursing note and vitals reviewed.    ED Treatments / Results  Labs (all labs ordered are listed, but only abnormal results are displayed) Labs Reviewed  CBC WITH DIFFERENTIAL/PLATELET - Abnormal; Notable for the following:       Result Value   RBC 3.52 (*)    Hemoglobin 9.6 (*)    HCT 31.1 (*)    RDW 17.8 (*)    Platelets 138 (*)    All other components within normal limits  BASIC METABOLIC PANEL - Abnormal; Notable for the following:    Chloride 100 (*)    Glucose, Bld 156 (*)    Calcium 8.8 (*)    All other components within normal limits  PROTIME-INR - Abnormal; Notable for the following:    Prothrombin Time 17.7 (*)    All other components within normal limits  URINALYSIS, ROUTINE W REFLEX MICROSCOPIC - Abnormal; Notable for the following:    Color, Urine STRAW (*)    Hgb urine dipstick MODERATE (*)    Protein, ur  30 (*)    All other components within normal limits    EKG  EKG Interpretation None       Radiology Ct Head Wo  Contrast  Result Date: 03/02/2017 CLINICAL DATA:  Lost balance, fell today. No loss of consciousness. History of dementia, hypertension, atrial fibrillation on anticoagulation, stroke. EXAM: CT HEAD WITHOUT CONTRAST TECHNIQUE: Contiguous axial images were obtained from the base of the skull through the vertex without intravenous contrast. COMPARISON:  CT HEAD May 08, 2016 FINDINGS: BRAIN: 9 x 9 mm LEFT temporal lobe subcortical white matter hematoma. 14 x 20 mm RIGHT occipital lobe cortical based hematoma with surrounding vasogenic edema. Ventricles and sulci are normal for patient's age. Confluent supratentorial white matter hypodensities. Old bilateral basal ganglia and thalamus lacunar infarcts. No acute large vascular territory infarcts. No abnormal extra-axial fluid collections. VASCULAR: Moderate calcific atherosclerosis of the carotid siphons. SKULL: No skull fracture. Small RIGHT parietal vertex scalp hematoma without subcutaneous gas or radiopaque foreign bodies. SINUSES/ORBITS: Atretic LEFT maxillary sinus compatible with chronic sinusitis, with air-fluid level. Mild general paranasal sinus mucosal thickening.The included ocular globes and orbital contents are non-suspicious. Status post bilateral ocular lens implants. OTHER: None. IMPRESSION: 1. 9 mm LEFT temporal and 14 mm RIGHT occipital lobe hematomas, in the setting of trauma and anticoagulation these could reflect hemorrhagic contusions though, sequelae of amyloid angiopathy or, hemorrhagic mass would have a similar appearance. Recommend 6-12 hour follow-up to verify stability of findings. 2. Severe chronic small vessel ischemic disease and old deep gray nuclei lacunar infarcts. 3. Small RIGHT vertex scalp hematoma without skull fracture. Critical Value/emergent results were called by telephone at the time of interpretation on 03/02/2017 at 8:03 pm to Dr. Nat Christen , who verbally acknowledged these results. Electronically Signed   By: Elon Alas M.D.   On: 03/02/2017 20:04    Procedures Procedures (including critical care time)  Medications Ordered in ED Medications  prothrombin complex conc human (KCENTRA) IVPB 3,650 Units (not administered)  sodium chloride 0.9 % bolus 500 mL (0 mLs Intravenous Stopped 03/02/17 2104)  cloNIDine (CATAPRES) tablet 0.1 mg (0.1 mg Oral Given 03/02/17 2131)     Initial Impression / Assessment and Plan / ED Course  I have reviewed the triage vital signs and the nursing notes.  Pertinent labs & imaging results that were available during my care of the patient were reviewed by me and considered in my medical decision making (see chart for details).    CRITICAL CARE Performed by: Nat Christen Total critical care time: 30 minutes Critical care time was exclusive of separately billable procedures and treating other patients. Critical care was necessary to treat or prevent imminent or life-threatening deterioration. Critical care was time spent personally by me on the following activities: development of treatment plan with patient and/or surrogate as well as nursing, discussions with consultants, evaluation of patient's response to treatment, examination of patient, obtaining history from patient or surrogate, ordering and performing treatments and interventions, ordering and review of laboratory studies, ordering and review of radiographic studies, pulse oximetry and re-evaluation of patient's condition.  Status post accidental fall this evening. CT head reveals a left temporal and right occipital hematoma/hemorrhage. Findings were discussed with the neurosurgeon on-call. Recommend admission to the hospital for observation. Patient is on Eliquis.  Discussed reversal agents with pharmacist on-call. He recommended Kcentra.  Discussed with hospitalist Dr. Lorin Mercy  Final Clinical Impressions(s) / ED Diagnoses   Final diagnoses:  Fall, initial encounter  Cerebral hemorrhage ([place])  New  Prescriptions New Prescriptions   No medications on file     Nat Christen, MD 03/02/17 [id number]    Nat Christen, MD 03/02/17 2306

## 2017-03-02 NOTE — ED Triage Notes (Signed)
Per EMS pt fell today and has a dime sized laceration to the top of his head.  Pt states that he lost his balance and denies any pain at this time except for chronic back pain.  Pt states that he is on blood thinners and wears oxygen at night 2-3 LPM  CBG 147 BP 160/108 HR 68

## 2017-03-02 NOTE — H&P (Signed)
History and Physical    Richard [name] TMA:[id number] DOB: 15-Jan-1931 DOA: 03/02/2017  PCP: Prince Solian, MD Consultants:  Nahser - cardiology; Deveshwar - rheumatology Patient coming from: [place] with wife; NOK: wife, [phone]  Chief Complaint: head injury from fall  HPI: Richard [name] is a 83 y.o. male with medical history significant of DM; CVA in 10/17; pacemaker placement for complete heart block;mild dementia; OSA on CPAP; HTN; HLD; and afib on Eliquis presenting after a fall.  The patient got up to go to the restroom.  He wasn't balanced on his walker, got tangled up and fell.  [place] over a chair, landed on his buttocks and then rolled over and the back of his head hit a piece of furniture.  No headache, no vision changes, no dizziness.  Unable to stand in the ER due to arthritis issues, not related to his head injury.  No h/o head trauma in the past.  Currently just tired and hungry.   ED Course: Mechanical fall with occipital superficial hematoma as well as left temporal and right occipital hematoma/hemorrhage.  Discussed with NP fro Dr. Weston Settle - observe with repeat CT in 6-12 hours.  On Eliquis - reversal with KCentra.  Review of Systems: As per HPI; otherwise review of systems reviewed and negative.   Ambulatory Status:  Ambulates with a walker  Past Medical History:  Diagnosis Date  . Anxiety   . Arthritis   . Atrial fibrillation ([place])    a. Dx 03/2013, notes report atrial fibrillation/atrial flutter, placed on amiodarone. NSR in subsequent OV's.  . B12 deficiency anemia   . Chest pain    a. H/o CTA negative for PE 2012, normal cath 2005, normal nucs previously including 05/2012.  Marland Kitchen Complete heart block (HCC)    a. s/p Medtronic Adapta L model ADDRL 1 (serial number [id number]) pacemaker.  . Dementia   . GERD (gastroesophageal reflux disease)   . Hiatal hernia   . Hyperlipidemia   . Hypertension   . Iron deficiency anemia   . LBBB (left bundle branch  block)   . Microcytic anemia   . Nephrolithiasis   . On home oxygen therapy    a. 2L w/CPAP at night  . Orthostatic hypotension   . OSA on CPAP    setting = 4  . Pacemaker   . [place] spotted fever ~ 1945  . Sinus drainage   . Skin cancer of lip   . Stroke ([place]) 05/2016  . Type II diabetes mellitus ([place])     Past Surgical History:  Procedure Laterality Date  . CARDIAC CATHETERIZATION     by Dr. Acie Fredrickson, January 24, 2004, that shows minimal coronary artery irregularities and normal left ventricular function  . CATARACT EXTRACTION W/ INTRAOCULAR LENS  IMPLANT, BILATERAL Bilateral   . COLONOSCOPY WITH PROPOFOL N/A 05/23/2016   Procedure: COLONOSCOPY WITH PROPOFOL;  Surgeon: Jerene Bears, MD;  Location: [facility];  Service: Gastroenterology;  Laterality: N/A;  . ENTEROSCOPY N/A 06/02/2016   Procedure: ENTEROSCOPY;  Surgeon: Gatha Mayer, MD;  Location: [facility] ENDOSCOPY;  Service: Endoscopy;  Laterality: N/A;  . ESOPHAGOGASTRODUODENOSCOPY N/A 05/22/2016   Procedure: ESOPHAGOGASTRODUODENOSCOPY (EGD);  Surgeon: Jerene Bears, MD;  Location: [facility] ENDOSCOPY;  Service: Endoscopy;  Laterality: N/A;  . ESOPHAGOGASTRODUODENOSCOPY (EGD) WITH PROPOFOL N/A 06/21/2015   Procedure: ESOPHAGOGASTRODUODENOSCOPY (EGD) WITH PROPOFOL;  Surgeon: Gatha Mayer, MD;  Location: [facility] ENDOSCOPY;  Service: Endoscopy;  Laterality: N/A;  . GIVENS CAPSULE STUDY N/A  05/23/2016   Procedure: GIVENS CAPSULE STUDY;  Surgeon: Jerene Bears, MD;  Location: [facility];  Service: Gastroenterology;  Laterality: N/A;  . HOT HEMOSTASIS N/A 06/02/2016   Procedure: HOT HEMOSTASIS (ARGON PLASMA COAGULATION/BICAP);  Surgeon: Gatha Mayer, MD;  Location: [facility];  Service: Endoscopy;  Laterality: N/A;  . INGUINAL HERNIA REPAIR Right   . IR RADIOLOGIST EVAL & MGMT  11/25/2016  . IR RADIOLOGIST EVAL & MGMT  01/22/2017  . IR VERTEBROPLASTY CERV/THOR BX INC UNI/BIL INC/INJECT/IMAGING  01/30/2017  . IR VERTEBROPLASTY EA ADDL (T&LS) BX  INC UNI/BIL INC INJECT/IMAGING  11/28/2016  . IR VERTEBROPLASTY EA ADDL (T&LS) BX INC UNI/BIL INC INJECT/IMAGING  01/30/2017  . IR VERTEBROPLASTY LUMBAR BX INC UNI/BIL INC/INJECT/IMAGING  11/28/2016  . LUMBAR DISC SURGERY  ~ 1993  . PERMANENT PACEMAKER INSERTION N/A 06/03/2014   MDT Adapta L implanted by Dr Rayann Heman for syncope and transient AV block  . SKIN CANCER EXCISION     "lower lip" (04/06/2013)  . TEMPORARY PACEMAKER INSERTION N/A 05/31/2014   Procedure: TEMPORARY WIRE;  Surgeon: Sinclair Grooms, MD;  Location: [facility] CATH LAB;  Service: Cardiovascular;  Laterality: N/A;  . VASECTOMY     Hx of     Social History   Social History  . Marital status: Married    Spouse name: N/A  . Number of children: 2  . Years of education: N/A   Occupational History  . retired    Social History Main Topics  . Smoking status: Former Smoker    Packs/day: 0.00    Years: 20.00    Types: Cigarettes  . Smokeless tobacco: Never Used     Comment: 04/06/2013 "quit smoking years ago; didn't smoke that much; probably 20 years; 1ppd"  . Alcohol use No  . Drug use: No  . Sexual activity: No   Other Topics Concern  . Not on file   Social History Narrative  . No narrative on file    Allergies  Allergen Reactions  . Codeine Other (See Comments)    Hallucinations   . Citalopram Swelling and Rash    Family History  Problem Relation Age of Onset  . Thyroid disease Mother        goiter  . Pneumonia Father   . Other Unknown        Parents both died of old age; other conditions not known  . Stroke Brother   . Breast cancer Sister     Prior to Admission medications   Medication Sig Start Date End Date Taking? Authorizing Provider  acetaminophen (TYLENOL) 500 MG tablet Take 500-1,000 mg by mouth 2 (two) times daily.    Yes [provider]  amiodarone (PACERONE) 200 MG tablet Take 200 mg by mouth 3 (three) times a week.   Yes [provider]  atorvastatin (LIPITOR) 40 MG tablet  Take 1 tablet (40 mg total) by mouth daily at 6 PM. 05/16/16  Yes Love, Ivan Anchors, PA-C  cyanocobalamin (,VITAMIN B-12,) 1000 MCG/ML injection Inject 1,000 mcg into the muscle every 30 (thirty) days.   Yes [provider]  diazepam (VALIUM) 5 MG tablet Take 5 mg by mouth at bedtime.    Yes [provider]  ELIQUIS 5 MG TABS tablet TAKE 1 TABLET (5 MG TOTAL) BY MOUTH 2 (TWO) TIMES DAILY. 12/30/16  Yes Nahser, Wonda Cheng, MD  fluticasone (FLONASE) 50 MCG/ACT nasal spray Place 1 spray into the nose daily as needed for allergies or rhinitis.  Yes [provider]  furosemide (LASIX) 40 MG tablet Take one tablet daily on Monday and Thursday. 01/29/17  Yes Nahser, Wonda Cheng, MD  levothyroxine (SYNTHROID, LEVOTHROID) 25 MCG tablet Take 25 mcg by mouth daily before breakfast.   Yes [provider]  MENTHOL, TOPICAL ANALGESIC, EX Apply 1 application topically daily as needed (muscle pain).   Yes [provider]  metoprolol (LOPRESSOR) 50 MG tablet TAKE 1 TABLET BY MOUTH TWICE A DAY 07/07/16  Yes Nahser, Wonda Cheng, MD  pantoprazole (PROTONIX) 40 MG tablet Take 1 tablet (40 mg total) by mouth daily at 6 (six) AM. 05/25/16  Yes Kelvin Cellar, MD  potassium chloride (K-DUR) 10 MEQ tablet Take one tablet by mouth daily on Monday and Thursday. 01/29/17  Yes Nahser, Wonda Cheng, MD  sertraline (ZOLOFT) 100 MG tablet Take 100 mg by mouth daily.  08/21/14  Yes [provider]    Physical Exam: Vitals:   03/02/17 2230 03/02/17 2300 03/02/17 2330 03/03/17 0007  BP: (!) 181/93 (!) 182/97 (!) 180/86 (!) 183/84  Pulse:  69 68 67  Resp: 19 15 18 19   Temp:      TempSrc:      SpO2: 95% 93% 92% 96%  Weight:      Height:         General: Appears calm and comfortable and is NAD Eyes:  PERRL, EOMI, normal lids, iris ENT:  Hard of hearing, normal lips & tongue, mmm Neck:  no LAD, masses or thyromegaly Cardiovascular:  RRR, no m/r/g. 2+ pitting LE edema, R>L.  Respiratory:   CTA bilaterally, no w/r/r. Normal respiratory effort. Abdomen:  soft, ntnd, NABS Skin:  Hematoma with hemostatic laceration along the crown of his head Musculoskeletal:  grossly normal tone BUE/BLE, good ROM, no bony abnormality Psychiatric:  grossly normal mood and affect, speech fluent and appropriate, AOx2 Neurologic:  CN 2-12 grossly intact, moves all extremities in coordinated fashion, sensation intact  Labs on Admission: I have personally reviewed following labs and imaging studies  CBC:  Recent Labs Lab 03/02/17 2029  WBC 6.0  NEUTROABS 4.4  HGB 9.6*  HCT 31.1*  MCV 88.4  PLT 195*   Basic Metabolic Panel:  Recent Labs Lab 03/02/17 2029  NA 135  K 3.8  CL 100*  CO2 26  GLUCOSE 156*  BUN 13  CREATININE 1.09  CALCIUM 8.8*   GFR: Estimated Creatinine Clearance: 65.9 mL/min (by C-G formula based on SCr of 1.09 mg/dL). Liver Function Tests: No results for input(s): AST, ALT, ALKPHOS, BILITOT, PROT, ALBUMIN in the last 168 hours. No results for input(s): LIPASE, AMYLASE in the last 168 hours. No results for input(s): AMMONIA in the last 168 hours. Coagulation Profile:  Recent Labs Lab 03/02/17 2029  INR 1.44   Cardiac Enzymes: No results for input(s): CKTOTAL, CKMB, CKMBINDEX, TROPONINI in the last 168 hours. BNP (last 3 results) No results for input(s): PROBNP in the last 8760 hours. HbA1C: No results for input(s): HGBA1C in the last 72 hours. CBG: No results for input(s): GLUCAP in the last 168 hours. Lipid Profile: No results for input(s): CHOL, HDL, LDLCALC, TRIG, CHOLHDL, LDLDIRECT in the last 72 hours. Thyroid Function Tests: No results for input(s): TSH, T4TOTAL, FREET4, T3FREE, THYROIDAB in the last 72 hours. Anemia Panel: No results for input(s): VITAMINB12, FOLATE, FERRITIN, TIBC, IRON, RETICCTPCT in the last 72 hours. Urine analysis:    Component Value Date/Time   COLORURINE STRAW (A) 03/02/2017 2032   APPEARANCEUR CLEAR 03/02/2017 2032  LABSPEC 1.006 03/02/2017 2032   PHURINE 5.0 03/02/2017 2032   GLUCOSEU NEGATIVE 03/02/2017 2032   HGBUR MODERATE (A) 03/02/2017 2032   BILIRUBINUR NEGATIVE 03/02/2017 2032   [place] NEGATIVE 03/02/2017 2032   PROTEINUR 30 (A) 03/02/2017 2032   UROBILINOGEN 0.2 03/30/2014 1400   NITRITE NEGATIVE 03/02/2017 2032   LEUKOCYTESUR NEGATIVE 03/02/2017 2032    Creatinine Clearance: Estimated Creatinine Clearance: 65.9 mL/min (by C-G formula based on SCr of 1.09 mg/dL).  Sepsis Labs: @LABRCNTIP (procalcitonin:4,lacticidven:4) )No results found for this or any previous visit (from the past 240 hour(s)).   Radiological Exams on Admission: Ct Head Wo Contrast  Result Date: 03/02/2017 CLINICAL DATA:  Lost balance, fell today. No loss of consciousness. History of dementia, hypertension, atrial fibrillation on anticoagulation, stroke. EXAM: CT HEAD WITHOUT CONTRAST TECHNIQUE: Contiguous axial images were obtained from the base of the skull through the vertex without intravenous contrast. COMPARISON:  CT HEAD May 08, 2016 FINDINGS: BRAIN: 9 x 9 mm LEFT temporal lobe subcortical white matter hematoma. 14 x 20 mm RIGHT occipital lobe cortical based hematoma with surrounding vasogenic edema. Ventricles and sulci are normal for patient's age. Confluent supratentorial white matter hypodensities. Old bilateral basal ganglia and thalamus lacunar infarcts. No acute large vascular territory infarcts. No abnormal extra-axial fluid collections. VASCULAR: Moderate calcific atherosclerosis of the carotid siphons. SKULL: No skull fracture. Small RIGHT parietal vertex scalp hematoma without subcutaneous gas or radiopaque foreign bodies. SINUSES/ORBITS: Atretic LEFT maxillary sinus compatible with chronic sinusitis, with air-fluid level. Mild general paranasal sinus mucosal thickening.The included ocular globes and orbital contents are non-suspicious. Status post bilateral ocular lens implants. OTHER: None. IMPRESSION:  1. 9 mm LEFT temporal and 14 mm RIGHT occipital lobe hematomas, in the setting of trauma and anticoagulation these could reflect hemorrhagic contusions though, sequelae of amyloid angiopathy or, hemorrhagic mass would have a similar appearance. Recommend 6-12 hour follow-up to verify stability of findings. 2. Severe chronic small vessel ischemic disease and old deep gray nuclei lacunar infarcts. 3. Small RIGHT vertex scalp hematoma without skull fracture. Critical Value/emergent results were called by telephone at the time of interpretation on 03/02/2017 at 8:03 pm to Dr. Nat Christen , who verbally acknowledged these results. Electronically Signed   By: Elon Alas M.D.   On: 03/02/2017 20:04    EKG: not done Assessment/Plan Principal Problem:   Head trauma Active Problems:   Essential hypertension   OSA (obstructive sleep apnea)   Diabetes mellitus (HCC)   Atrial fibrillation (HCC)   Anemia   Chronic anticoagulation   Cognitive deficits   Head trauma with intracranial hemorrhage from mechanical fall -Patient without apparent neuro deficits and no reported LOC -Currently with baseline mental status (has mild dementia) -Based on abnormal CT findings, patient was discussed with neurosurgery who recommends observation -Will transfer to [facility] for availability of neurosurgery if needed; patient and wife are in agreement with this plan -Order written to page neurosurgery for consult upon patient arrival -Will repeat STAT head CT upon arrival, 6-12 hours after initial -If stable, patient is likely appropriate for discharge -Will request PT consult prior to discharge  Afib on Eliquis -Based on head bleed, will attempt to reverse Eliquis with Eppie Gibson per pharmacy protocol -Will defer to neurosurg when to resume this medication -CHA2DS2-VASc score 7, approaching 10%/year stroke risk; needs to resume anticoagulation when able  OSA -Hold CPAP, as it is contraindicated in the setting of head  bleed  DM -Glucose 156 -Appears to be diet-controlled -Will cover with SSI sensitive  scale  HTN -Continue Lopressor  Anemia -Hgb 9.6, stable   DVT prophylaxis:  SCDs Code Status: Full - confirmed with patient/family Family Communication: Wife present throughout evaluation Disposition Plan:  Home once clinically improved Consults called: Neurosurgery  Admission status: It is my clinical opinion that referral for OBSERVATION is reasonable and necessary in this patient based on the above information provided. The aforementioned taken together are felt to place the patient at high risk for further clinical deterioration. However it is anticipated that the patient may be medically stable for discharge from the hospital within 24 to 48 hours.    Karmen Bongo MD [facility]  If 7PM-7AM, please contact night-coverage [email or web address] Password [email or web address]  03/03/2017, 12:44 AM

## 2017-03-03 ENCOUNTER — Observation Stay (HOSPITAL_COMMUNITY): Payer: Medicare Other

## 2017-03-03 ENCOUNTER — Encounter (HOSPITAL_COMMUNITY): Payer: Self-pay | Admitting: Emergency Medicine

## 2017-03-03 DIAGNOSIS — I629 Nontraumatic intracranial hemorrhage, unspecified: Secondary | ICD-10-CM | POA: Diagnosis not present

## 2017-03-03 DIAGNOSIS — R4189 Other symptoms and signs involving cognitive functions and awareness: Secondary | ICD-10-CM | POA: Diagnosis not present

## 2017-03-03 DIAGNOSIS — I10 Essential (primary) hypertension: Secondary | ICD-10-CM | POA: Diagnosis not present

## 2017-03-03 DIAGNOSIS — Z7901 Long term (current) use of anticoagulants: Secondary | ICD-10-CM

## 2017-03-03 DIAGNOSIS — S0990XD Unspecified injury of head, subsequent encounter: Secondary | ICD-10-CM | POA: Diagnosis not present

## 2017-03-03 DIAGNOSIS — S0990XA Unspecified injury of head, initial encounter: Secondary | ICD-10-CM | POA: Diagnosis not present

## 2017-03-03 DIAGNOSIS — I480 Paroxysmal atrial fibrillation: Secondary | ICD-10-CM

## 2017-03-03 DIAGNOSIS — E1159 Type 2 diabetes mellitus with other circulatory complications: Secondary | ICD-10-CM

## 2017-03-03 DIAGNOSIS — G4733 Obstructive sleep apnea (adult) (pediatric): Secondary | ICD-10-CM

## 2017-03-03 DIAGNOSIS — I619 Nontraumatic intracerebral hemorrhage, unspecified: Secondary | ICD-10-CM | POA: Diagnosis not present

## 2017-03-03 DIAGNOSIS — D649 Anemia, unspecified: Secondary | ICD-10-CM

## 2017-03-03 DIAGNOSIS — S06300A Unspecified focal traumatic brain injury without loss of consciousness, initial encounter: Secondary | ICD-10-CM | POA: Diagnosis not present

## 2017-03-03 LAB — CBC
HCT: 29 % — ABNORMAL LOW (ref 39.0–52.0)
Hemoglobin: 8.8 g/dL — ABNORMAL LOW (ref 13.0–17.0)
MCH: 26.9 pg (ref 26.0–34.0)
MCHC: 30.3 g/dL (ref 30.0–36.0)
MCV: 88.7 fL (ref 78.0–100.0)
PLATELETS: 127 10*3/uL — AB (ref 150–400)
RBC: 3.27 MIL/uL — ABNORMAL LOW (ref 4.22–5.81)
RDW: 17.7 % — AB (ref 11.5–15.5)
WBC: 5 10*3/uL (ref 4.0–10.5)

## 2017-03-03 LAB — BASIC METABOLIC PANEL
Anion gap: 9 (ref 5–15)
BUN: 9 mg/dL (ref 6–20)
CALCIUM: 8.5 mg/dL — AB (ref 8.9–10.3)
CO2: 25 mmol/L (ref 22–32)
Chloride: 102 mmol/L (ref 101–111)
Creatinine, Ser: 0.98 mg/dL (ref 0.61–1.24)
GFR calc Af Amer: >60 mL/min (ref >60)
GFR calc non Af Amer: >60 mL/min (ref >60)
GLUCOSE: 154 mg/dL — AB (ref 65–99)
Potassium: 3.4 mmol/L — ABNORMAL LOW (ref 3.5–5.1)
SODIUM: 136 mmol/L (ref 135–145)

## 2017-03-03 LAB — GLUCOSE, CAPILLARY
GLUCOSE-CAPILLARY: 124 mg/dL — AB (ref 65–99)
Glucose-Capillary: 153 mg/dL — ABNORMAL HIGH (ref 65–99)

## 2017-03-03 MED ORDER — DIAZEPAM 5 MG PO TABS
5.0000 mg | ORAL_TABLET | Freq: Every day | ORAL | Status: DC
  Administered 2017-03-03: 5 mg via ORAL
  Filled 2017-03-03: qty 1

## 2017-03-03 MED ORDER — ONDANSETRON HCL 4 MG PO TABS: 4.0000 mg | ORAL_TABLET | Freq: Four times a day (QID) | ORAL | Status: DC | PRN

## 2017-03-03 MED ORDER — POTASSIUM CHLORIDE CRYS ER 20 MEQ PO TBCR
40.0000 meq | EXTENDED_RELEASE_TABLET | Freq: Once | ORAL | Status: AC
  Administered 2017-03-03: 40 meq via ORAL
  Filled 2017-03-03: qty 2

## 2017-03-03 MED ORDER — DOCUSATE SODIUM 100 MG PO CAPS
100.0000 mg | ORAL_CAPSULE | Freq: Two times a day (BID) | ORAL | Status: DC
  Administered 2017-03-03 (×2): 100 mg via ORAL
  Filled 2017-03-03 (×2): qty 1

## 2017-03-03 MED ORDER — ACETAMINOPHEN 650 MG RE SUPP: 650.0000 mg | Freq: Four times a day (QID) | RECTAL | Status: DC | PRN

## 2017-03-03 MED ORDER — HYDRALAZINE HCL 25 MG PO TABS
25.0000 mg | ORAL_TABLET | Freq: Three times a day (TID) | ORAL | Status: DC
  Administered 2017-03-03 (×2): 25 mg via ORAL
  Filled 2017-03-03 (×2): qty 1

## 2017-03-03 MED ORDER — PANTOPRAZOLE SODIUM 40 MG PO TBEC
40.0000 mg | DELAYED_RELEASE_TABLET | Freq: Every day | ORAL | Status: DC
  Administered 2017-03-03: 40 mg via ORAL
  Filled 2017-03-03: qty 1

## 2017-03-03 MED ORDER — AMIODARONE HCL 200 MG PO TABS: 200.0000 mg | ORAL_TABLET | ORAL | Status: DC

## 2017-03-03 MED ORDER — ONDANSETRON HCL 4 MG/2ML IJ SOLN: 4.0000 mg | Freq: Four times a day (QID) | INTRAMUSCULAR | Status: DC | PRN

## 2017-03-03 MED ORDER — SERTRALINE HCL 100 MG PO TABS
100.0000 mg | ORAL_TABLET | Freq: Every day | ORAL | Status: DC
  Administered 2017-03-03: 100 mg via ORAL
  Filled 2017-03-03: qty 1

## 2017-03-03 MED ORDER — ACETAMINOPHEN 325 MG PO TABS
650.0000 mg | ORAL_TABLET | Freq: Four times a day (QID) | ORAL | Status: DC | PRN
  Administered 2017-03-03 (×2): 650 mg via ORAL
  Filled 2017-03-03 (×2): qty 2

## 2017-03-03 MED ORDER — METOPROLOL TARTRATE 50 MG PO TABS
50.0000 mg | ORAL_TABLET | Freq: Two times a day (BID) | ORAL | Status: DC
  Administered 2017-03-03 (×2): 50 mg via ORAL
  Filled 2017-03-03 (×2): qty 1

## 2017-03-03 MED ORDER — LEVOTHYROXINE SODIUM 25 MCG PO TABS
25.0000 ug | ORAL_TABLET | Freq: Every day | ORAL | Status: DC
  Administered 2017-03-03: 25 ug via ORAL
  Filled 2017-03-03: qty 1

## 2017-03-03 MED ORDER — ATORVASTATIN CALCIUM 40 MG PO TABS: 40.0000 mg | ORAL_TABLET | Freq: Every day | ORAL | Status: DC

## 2017-03-03 MED ORDER — INSULIN ASPART 100 UNIT/ML ~~LOC~~ SOLN
0.0000 [IU] | Freq: Three times a day (TID) | SUBCUTANEOUS | Status: DC
  Administered 2017-03-03: 2 [IU] via SUBCUTANEOUS
  Administered 2017-03-03: 1 [IU] via SUBCUTANEOUS

## 2017-03-03 NOTE — ED Notes (Signed)
Attempted to call report to [facility], was told nurse was busy, name and number taken and was told nurse would call me back for report.

## 2017-03-03 NOTE — Discharge Instructions (Signed)
Fall Prevention in Hospitals, Adult WHAT ARE SOME SAFETY TIPS FOR PREVENTING FALLS? If you or a loved one has to stay in the hospital, talk with the health care providers about the risk of falling. Find out which medicines or treatments can cause dizziness or affect balance. Make a plan with the health care providers to prevent falls. The plan may include these points:  Ask for help moving around, especially after surgery or when feeling unwell.  Have support available when getting up and moving around.  Wear nonskid footwear.  Use the safety straps on wheelchairs.  Ask for help to get objects that are out of reach.  Wear eyeglasses.  Remove all clutter from the floor and the sides of the bed.  Keep equipment and wires securely out of the way.  Keep the bed locked in the low position.  Keep the side rails up on the bed.  Keep the nurse call button within reach.  Keep the door open when no one else is in the room.  Have someone stay in the hospital with you or your loved one.  Ask for a bed alarm if you are not able to stay with your loved one who is at risk for getting up without help.  Ask if sleeping pills or other medicines that alter mental status are necessary.  WHAT INCREASES THE RISK FOR FALLS? Certain conditions and treatments may increase a patient's risk for falls in a hospital. These include:  Being in an unfamiliar environment.  Being on bed rest.  Having surgery.  Taking certain medicines, such as sleeping pills.  Having tubes in place, such as IV lines or catheters.  Additional risk factors for falls in a hospital include:  Having vision problems.  Having a change in thinking, feeling, or behavior (altered mental status).  Having trouble with balance.  Needing to use the toilet frequently.  Having fallen in the past three months.  Having low blood pressure when standing up quickly (orthostatic hypotension).  WHAT DOES THE HOSPITAL STAFF DO TO  HELP PREVENT ME OR MY LOVED ONE FROM FALLING? Hospitals have systems in place to prevent falls and accidents. Talk with the hospital staff about:  Doing an assessment to discuss fall risks and create a personalized plan to prevent falls.  Checking in regularly to see if help is needed for moving around and to assess any changes in fall risk.  Knowing where the nurse call button is and how to use it. Use this to call a nursing care provider any time.  Keeping personal items within reach. This includes eyeglasses, phones, and other electronic devices.  Following general safety guidelines when moving around.  Keeping the area around the bed free from clutter.  Removing unnecessary equipment or tubes to reduce the risk of tripping.  Using safety equipment, such as: ? Walkers, crutches, and other walking devices for support. ? Safety rails on the bed. ? Safety straps in the bed. ? A bed that can be lowered and locked to prevent movement. ? Handrails in the bathroom. ? Nonskid socks and shoes. ? Locking mechanisms to secure equipment in place. ? Lifting and transfer equipment.  WHAT CAN I DO TO HELP PREVENT A FALL?  Talk with health care providers about fall prevention.  Have a personalized fall prevention plan in place.  Do not try to move around if you feel off balance or ill.  Change position slowly.  Sit on the side of the bed before standing up.  Sit down and call for help if you feel dizzy or unsteady when standing.  Keep the hospital room clear of clutter.  WHEN SHOULD I ASK FOR HELP? Ask for help whenever you:  Are not sure if you are able to move around safely.  Feel dizzy or unsteady.  Are not comfortable helping your loved one move around or use the bathroom.  If you or a loved one falls, tell the hospital staff. This is important. This information is not intended to replace advice given to you by your health care provider. Make sure you discuss any questions  you have with your health care provider. Document Released: 08/08/2000 Document Revised: 01/08/2016 Document Reviewed: 05/24/2015 Elsevier Interactive Patient Education  2017 [place] Injury, Adult There are many types of head injuries. They can be as minor as a bump. Some head injuries can be worse. Worse injuries include:  A strong hit to the head that hurts the brain (concussion).  A bruise of the brain (contusion). This means there is bleeding in the brain that can cause swelling.  A cracked skull (skull fracture).  Bleeding in the brain that gathers, gets thick (makes a clot), and forms a bump (hematoma).  Most problems from a head injury come in the first 24 hours. However, you may still have side effects up to 7-10 days after your injury. It is important to watch your condition for any changes. Follow these instructions at home: Activity  Rest as much as possible.  Avoid activities that are hard or tiring.  Make sure you get enough sleep.  Limit activities that need a lot of thought or attention, such as: ? Watching TV. ? Playing memory games and puzzles. ? Job-related work or homework. ? Working on [employer], and texting.  Avoid activities that could cause another head injury until your doctor says it is okay. This includes playing sports.  Ask your doctor when it is safe for you to go back to your normal activities, such as work or school. Ask your doctor for a step-by-step plan for slowly going back to your normal activities.  Ask your doctor when you can drive, ride a bicycle, or use heavy machinery. Never do these activities if you are dizzy.  Lifestyle  Do not drink alcohol until your doctor says it is okay.  Avoid drug use.  If it is harder than usual to remember things, write them down.  If you are easily distracted, try to do one thing at a time.  Talk with family members or close friends when making important  decisions.  Tell your friends, family, a trusted coworker, and work Fre[employer] about your injury, symptoms, and limits (restrictions). Have them watch for any problems that are new or getting worse.  General instructions  Take over-the-counter and prescription medicines only as told by your doctor.  Have someone stay with you for 24 hours after your head injury. This person should watch you for any changes in your symptoms and be ready to get help.  Keep all follow-up visits as told by your doctor. This is important.  Prevention  Work on [employer]. This can help you avoid falls.  Wear a seatbelt when you are in a moving vehicle.  Wear a helmet when: ? Riding a bicycle. ? Skiing. ? Doing any other sport or activity that has a risk of injury.  Drink alcohol only in moderation.  Make your home safer by: ? Getting  rid of clutter from the floors and stairs, like things that can make you trip. ? Using grab bars in bathrooms and handrails by stairs. ? Placing non-slip mats on floors and in bathtubs. ? Putting more light in dim areas. Get help right away if:  You have: ? A very bad (severe) headache that is not helped by medicine. ? Trouble walking or weakness in your arms and legs. ? Clear or bloody fluid coming from your nose or ears. ? Changes in your seeing (vision). ? Jerky movements that you cannot control (seizure).  You throw up (vomit).  Your symptoms get worse.  You lose balance.  Your speech is slurred.  You pass out.  You are sleepier and have trouble staying awake.  The black centers of your eyes (pupils) change in size. These symptoms may be an emergency. Do not wait to see if the symptoms will go away. Get medical help right away. Call your local emergency services (911 in the [place] Do not drive yourself to the hospital. This information is not intended to replace advice given to you by your health care provider. Make sure you discuss any  questions you have with your health care provider. Document Released: 07/24/2008 Document Revised: 03/06/2016 Document Reviewed: 02/19/2016 Elsevier Interactive Patient Education  2017 [employer].

## 2017-03-03 NOTE — Care Management Obs Status (Signed)
[place] NOTIFICATION   Patient Details  Name: Richard [name] MRN: [id number] Date of Birth: 10/23/1930   Medicare Observation Status Notification Given:  Yes    Pollie Friar, RN 03/03/2017, 3:03 PM

## 2017-03-03 NOTE — Discharge Summary (Signed)
Physician Discharge Summary  Richard [name] QQV:[id number] DOB: [birth year] DOA: 03/02/2017  PCP: Prince Solian, MD  Admit date: 03/02/2017 Discharge date: 03/03/2017  Admitted From: Home Discharge disposition: Home   Recommendations for Outpatient Follow-Up:   1. Home health services set up. 2. Repeat CT scan in 2 weeks which was ordered. He will need to follow-up with Dr. Saintclair Halsted post repeat CT. 3. No blood thinners until repeat CT is completed.   Discharge Diagnosis:   Principal Problem:    Head trauma resulting in intracranial hemorrhage Active Problems:    Essential hypertension    OSA (obstructive sleep apnea)    Diabetes mellitus (HCC)    Atrial fibrillation (HCC)    Anemia    Chronic anticoagulation    Cognitive deficits    Hypokalemia  Discharge Condition: Stable.  Diet recommendation: Low sodium, heart healthy.  Carbohydrate-modified.  Regular.  Wound care: Local wound care only.   History of Present Illness:   Richard [name] is an 83 y.o. male with a PMH of diabetes, prior CVA, complete heart block status post pacemaker, mild dementia, OSA on CPAP, hypertension, hyperlipidemia, and atrial fibrillation on chronic blood thinners who presented to [facility] after suffering from a fall. CT showed occipital superficial hematoma, left temporal and right occipital hematoma/hemorrhage. Eliquis was reversed with KCentra.    Hospital Course by Problem:   Principal Problem:   Head trauma resulting in left temporal and right occipital hematoma/hemorrhage in the setting of chronic anticoagulation Eliquis was reversed with KCentra. There was some surrounding vasogenic edema. Repeat CT Showed stable findings. Discussed case with Dr. Saintclair Halsted of neurology who recommended repeat CT of the head in 2 weeks and follow-up with him after CT completed, no blood thinners until CT shows no further bleeding.  Active Problems:   Essential hypertension Resume prehospital medications  at discharge.    OSA (obstructive sleep apnea) Continue CPAP daily at bedtime.    Diabetes mellitus ([place]) Currently being managed with insulin sensitive SSI 3 times a day. Controlled.     Atrial fibrillation (HCC)/Chronic anticoagulation Blood thinners on hold secondary to brain hemorrhage. Continue amiodarone.    Anemia Monitor closely. Slight drop in hemoglobin noted over the past 24 hours.    Cognitive deficits Supportive care.    Hypokalemia Given 40 mEq of oral potassium on the date of discharge.   Medical Consultants:    Telephone consultation with Dr. Saintclair Halsted of neurosurgery.   Discharge Exam:   Vitals:   03/03/17 0513 03/03/17 1100  BP: (!) 169/78 140/79  Pulse: 62 60  Resp: 20 20  Temp: (!) 97.3 F (36.3 C) 98.1 F (36.7 C)   Vitals:   03/03/17 0007 03/03/17 0135 03/03/17 0513 03/03/17 1100  BP: (!) 183/84 (!) 169/80 (!) 169/78 140/79  Pulse: 67 70 62 60  Resp: 19 20 20 20   Temp:  97.8 F (36.6 C) (!) 97.3 F (36.3 C) 98.1 F (36.7 C)  TempSrc:  Oral Oral Oral  SpO2: 96% 97% 98% 98%  Weight:      Height:       General exam: Appears calm and comfortable. Sitting up in chair. Respiratory system: Clear to auscultation. Respiratory effort normal. Cardiovascular system: S1 & S2 heard, RRR. No JVD,  rubs, gallops or clicks. II/VI murmur                                                             .  Gastrointestinal system: Abdomen is nondistended, soft and nontender. No organomegaly or masses felt. Normal bowel sounds heard. Central nervous system: Alert and oriented. No focal neurological deficits. Extremities: No clubbing,  or cyanosis. No edema. Skin: Right posterior occipital scalp hematoma, BUE ecchymosis. Psychiatry: Judgement and insight appear normal. Mood & affect flat.    The results of significant diagnostics from this hospitalization (including imaging, microbiology, ancillary and laboratory) are listed below for reference.      Procedures and Diagnostic Studies:   CT of the head 03/02/17:9 mm left temporal and 14 mm right occipital lobe hematomas consistent with hemorrhagic contusions. Severe chronic small vessel ischemic disease. Small right scalp hematoma. No skull fractures.    Ct Head 03/03/17: No significant interval change, no new hemorrhage or midline shift.    Labs:   Basic Metabolic Panel:  Recent Labs Lab 03/02/17 2029 03/03/17 0339  NA 135 136  K 3.8 3.4*  CL 100* 102  CO2 26 25  GLUCOSE 156* 154*  BUN 13 9  CREATININE 1.09 0.98  CALCIUM 8.8* 8.5*   GFR Estimated Creatinine Clearance: 73.3 mL/min (by C-G formula based on SCr of 0.98 mg/dL). Liver Function Tests: No results for input(s): AST, ALT, ALKPHOS, BILITOT, PROT, ALBUMIN in the last 168 hours. No results for input(s): LIPASE, AMYLASE in the last 168 hours. No results for input(s): AMMONIA in the last 168 hours. Coagulation profile  Recent Labs Lab 03/02/17 2029  INR 1.44    CBC:  Recent Labs Lab 03/02/17 2029 03/03/17 0339  WBC 6.0 5.0  NEUTROABS 4.4  --   HGB 9.6* 8.8*  HCT 31.1* 29.0*  MCV 88.4 88.7  PLT 138* 127*   Cardiac Enzymes: No results for input(s): CKTOTAL, CKMB, CKMBINDEX, TROPONINI in the last 168 hours. BNP: Invalid input(s): POCBNP CBG:  Recent Labs Lab 03/03/17 0653 03/03/17 1142  GLUCAP 124* 153*   D-Dimer No results for input(s): DDIMER in the last 72 hours. Hgb A1c No results for input(s): HGBA1C in the last 72 hours. Lipid Profile No results for input(s): CHOL, HDL, LDLCALC, TRIG, CHOLHDL, LDLDIRECT in the last 72 hours. Thyroid function studies No results for input(s): TSH, T4TOTAL, T3FREE, THYROIDAB in the last 72 hours.  Invalid input(s): FREET3 Anemia work up No results for input(s): VITAMINB12, FOLATE, FERRITIN, TIBC, IRON, RETICCTPCT in the last 72 hours. Microbiology No results found for this or any previous visit (from the past 240 hour(s)).   Discharge  Instructions:   Discharge Instructions    Call MD for:  difficulty breathing, headache or visual disturbances    Complete by:  As directed    Call MD for:  persistant dizziness or light-headedness    Complete by:  As directed    Call MD for:  persistant nausea and vomiting    Complete by:  As directed    Call MD for:  severe uncontrolled pain    Complete by:  As directed    Diet - low sodium heart healthy    Complete by:  As directed    Discharge instructions    Complete by:  As directed    Stop Eliquis until you have a follow up CT of the head and get the results back from Dr. Saintclair Halsted, who wants to see you after you've had the CT scan.   Face-to-face encounter (required for Medicare/Medicaid patients)    Complete by:  As directed    I RAMA,CHRISTINA certify that this patient is under my care and that I, or  a nurse practitioner or physician's assistant working with me, had a face-to-face encounter that meets the physician face-to-face encounter requirements with this patient on 03/03/2017. The encounter with the patient was in whole, or in part for the following medical condition(s) which is the primary reason for home health care (List medical condition): Weakness, fall with head trauma. Needs PT/RN/Aide/SW to provide information about community resources.   The encounter with the patient was in whole, or in part, for the following medical condition, which is the primary reason for home health care:  head trauma, brain bleed, fall.   I certify that, based on my findings, the following services are medically necessary home health services:   Nursing Physical therapy     Reason for Medically Necessary Home Health Services:   Skilled Nursing- Changes in Medication/Medication Management Skilled Nursing- Skilled Assessment/Observation Skilled Nursing- Teaching of Disease Process/Symptom Management Therapy- Personnel officer, Public librarian Therapy- Instruction on use of Assistive  Device for Ambulation on all Surfaces Therapy- Instruction on Safe use of Assistive Devices for ADLs Therapy- Home Adaptation to Facilitate Safety Therapy- Therapeutic Exercises to Increase Strength and Endurance     My clinical findings support the need for the above services:  Unable to leave home safely without assistance and/or assistive device   Further, I certify that my clinical findings support that this patient is homebound due to:  Unable to leave home safely without assistance   Home Health    Complete by:  As directed    To provide the following care/treatments:   PT [place] work     Increase activity slowly    Complete by:  As directed      Allergies as of 03/03/2017      Reactions   Codeine Other (See Comments)   Hallucinations   Citalopram Swelling, Rash      Medication List    STOP taking these medications   ELIQUIS 5 MG Tabs tablet Generic drug:  apixaban     TAKE these medications   acetaminophen 500 MG tablet Commonly known as:  TYLENOL Take 500-1,000 mg by mouth 2 (two) times daily.   amiodarone 200 MG tablet Commonly known as:  PACERONE Take 200 mg by mouth 3 (three) times a week.   atorvastatin 40 MG tablet Commonly known as:  LIPITOR Take 1 tablet (40 mg total) by mouth daily at 6 PM.   cyanocobalamin 1000 MCG/ML injection Commonly known as:  (VITAMIN B-12) Inject 1,000 mcg into the muscle every 30 (thirty) days.   diazepam 5 MG tablet Commonly known as:  VALIUM Take 5 mg by mouth at bedtime.   fluticasone 50 MCG/ACT nasal spray Commonly known as:  FLONASE Place 1 spray into the nose daily as needed for allergies or rhinitis.   furosemide 40 MG tablet Commonly known as:  LASIX Take one tablet daily on Monday and Thursday.   levothyroxine 25 MCG tablet Commonly known as:  SYNTHROID, LEVOTHROID Take 25 mcg by mouth daily before breakfast.   MENTHOL (TOPICAL ANALGESIC) EX Apply 1 application topically daily as  needed (muscle pain).   metoprolol tartrate 50 MG tablet Commonly known as:  LOPRESSOR TAKE 1 TABLET BY MOUTH TWICE A DAY   pantoprazole 40 MG tablet Commonly known as:  PROTONIX Take 1 tablet (40 mg total) by mouth daily at 6 (six) AM.   potassium chloride 10 MEQ tablet Commonly known as:  K-DUR Take one tablet by mouth daily on Monday  and Thursday.   sertraline 100 MG tablet Commonly known as:  ZOLOFT Take 100 mg by mouth daily.      Follow-up Information    Kary Kos, MD. Schedule an appointment as soon as possible for a visit in 2 week(s).   Specialty:  Neurosurgery Why:  Make an appointment after you have your CT scan date/time. Contact information: [address] [place] [address] [phone]            Time coordinating discharge: 30 minutes.  Signed:  RAMA,CHRISTINA  Pager [phone] [facility] 03/03/2017, 2:25 PM

## 2017-03-03 NOTE — Progress Notes (Signed)
Discharge instructions reviewed with patient/fmaily. All questions answered at this time. Equipment sent with patient. Transport home by family.   Ave Filter, RN

## 2017-03-03 NOTE — Evaluation (Signed)
Physical Therapy Evaluation Patient Details Name: Richard [name] MRN: [id number] DOB: April 26, 1931 Today's Date: 03/03/2017   History of Present Illness  83 y.o. male admitted on 03/02/17 for fall with CT showing occipital superficial hematoma, left temporal and right occipital hematoma/hemorrhage.  Pt with significant PMH of DM2, stroke, pacemaker, orthostatic hypotension, micorcytic anemia, LBBB, HTN, dementia, A-fib, lumbar disc surgery, and vertebroplasty.  Clinical Impression  Pt is very unsteady on his feet requiring up to mod assist to keep from falling over to his left during gait with RW.  He is at a very high risk of falling.  He is most appropriate for SNF placement at discharge, but is under observatiion status and is ready for d/c.  In an effort to not have wife walking him much to the bathroom before he is more steady on his feet and before home services are in there.   PT to follow acutely for deficits listed below.       Follow Up Recommendations SNF;Supervision/Assistance - 24 hour    Equipment Recommendations  3in1 (PT)    Recommendations for Other Services   NA    Precautions / Restrictions Precautions Precautions: Fall Precaution Comments: pt with 5-6 falls in a 6 month period.       Mobility  Bed Mobility               General bed mobility comments: Pt was OOB in the recliner chair when we entered the room.   Transfers Overall transfer level: Needs assistance Equipment used: Rolling walker (2 wheeled) Transfers: Sit to/from Stand Sit to Stand: Mod assist         General transfer comment: Mod assist to support trunk during transition to stand.  Pt leaning to the left mildly and needed cues for upright posture.  No reports of lightheadedness in standing.   Ambulation/Gait Ambulation/Gait assistance: Mod assist;+2 physical assistance Ambulation Distance (Feet): 100 Feet Assistive device: Rolling walker (2 wheeled) Gait Pattern/deviations: Step-through  pattern;Staggering left;Drifts right/left;Trunk flexed Gait velocity: 1.56 ft/sec Gait velocity interpretation: <1.8 ft/sec, indicative of risk for recurrent falls General Gait Details: Pt needed assist at trunk for balance and support over ever fatigueing legs.  He started with a slight left lean and by the time we made it back to his room is was a significant left lateral lean and drifting left with his RW that required mod assist to maintain balance and help him steer his RW. He needed near constant cues to stand tall and stay inside his RW.  Wife reported that she cannot physically help him.    Stairs Stairs: Yes       General stair comments: I did not feel safe trying the stairs.  Wife reports that her brother will be at home to help them in the house.       Modified Rankin (Stroke Patients Only) Modified Rankin (Stroke Patients Only) Pre-Morbid Rankin Score: Moderately severe disability Modified Rankin: Moderately severe disability     Balance Overall balance assessment: History of Falls;Needs assistance Sitting-balance support: Feet supported;Bilateral upper extremity supported Sitting balance-Leahy Scale: Fair Sitting balance - Comments: Pt uses hands to bring trunk off of backrest of chair and keeps them there until he is ready to stand.  Postural control: Left lateral lean Standing balance support: Bilateral upper extremity supported Standing balance-Leahy Scale: Poor Standing balance comment: up to mod assist at trunk for balance with the RW.  Pertinent Vitals/Pain Pain Assessment: No/denies pain    Home Living Family/patient expects to be discharged to:: Private residence Living Arrangements: Spouse/significant other Available Help at Discharge: Family Type of Home: House Home Access: Stairs to enter Entrance Stairs-Rails: Right (pt uses both hands to pull up right rail) Entrance Stairs-Number of Steps: 2-3 Home Layout: One  level;Other (Comment) (with a small step up into the kitchen) Home Equipment: Walker - 2 wheels;Shower seat;Grab bars - tub/shower;Wheelchair - manual      Prior Function Level of Independence: Needs assistance   Gait / Transfers Assistance Needed: Pt walks with RW, wife reports she has a hard time getting him to stay inside of the RW.  He falls frequently.            Hand Dominance   Dominant Hand: Right    Extremity/Trunk Assessment   Upper Extremity Assessment Upper Extremity Assessment: Generalized weakness;RUE deficits/detail;LUE deficits/detail RUE Deficits / Details: pt with good, equal grip strength bil and good elbow flexion strength bil 4/5, shoulder flexion ROM WNL on the right and only a little over 90 degrees on the left against gravity.  Pt reports being right handed and left shoulder has not always worked as well.  LUE Deficits / Details: pt with good, equal grip strength bil and good elbow flexion strength bil 4/5, shoulder flexion ROM WNL on the right and only a little over 90 degrees on the left against gravity.  Pt reports being right handed and left shoulder has not always worked as well.     Lower Extremity Assessment Lower Extremity Assessment: RLE deficits/detail;LLE deficits/detail RLE Deficits / Details: 4/5 and equal bil seated MMT strength, however, functionally, he was initially lagging his left leg behind him.     Cervical / Trunk Assessment Cervical / Trunk Assessment: Kyphotic;Other exceptions Cervical / Trunk Exceptions: leaning left in sitting, leaning left in standing.   Communication   Communication: HOH;Other (comment) (garbeled/mumbling speech, poor fitting dentures. )  Cognition Arousal/Alertness: Awake/alert Behavior During Therapy: WFL for tasks assessed/performed Overall Cognitive Status: History of cognitive impairments - at baseline Area of Impairment: Orientation;Safety/judgement;Awareness;Problem solving                  Orientation Level: Disoriented to;Time ("November")       Safety/Judgement: Decreased awareness of safety;Decreased awareness of deficits Awareness: Intellectual Problem Solving: Difficulty sequencing;Requires verbal cues;Requires tactile cues General Comments: Pt not understanding why I don't think he is safe to go home, yet I was holding him up to prevent him from falling during gait.       General Comments General comments (skin integrity, edema, etc.): VSS.  I spoke at length with wife re: d/c home and strategies for staying safe until home services can get in to help her.          Assessment/Plan    PT Assessment Patient needs continued PT services  PT Problem List Decreased strength;Decreased activity tolerance;Decreased balance;Decreased mobility;Decreased cognition;Decreased knowledge of use of DME;Decreased safety awareness;Decreased knowledge of precautions;Pain       PT Treatment Interventions DME instruction;Gait training;Stair training;Functional mobility training;Therapeutic activities;Therapeutic exercise;Balance training;Neuromuscular re-education;Cognitive remediation;Patient/family education    PT Goals (Current goals can be found in the Care Plan section)  Acute Rehab PT Goals Patient Stated Goal: to go home today PT Goal Formulation: With patient/family Time For Goal Achievement: 03/17/17 Potential to Achieve Goals: Good    Frequency Min 3X/week   Barriers to discharge Decreased caregiver support wife cannot physically help him as  much as he needs right now.        AM-PAC PT "6 Clicks" Daily Activity  Outcome Measure Difficulty turning over in bed (including adjusting bedclothes, sheets and blankets)?: Total Difficulty moving from lying on back to sitting on the side of the bed? : Total Difficulty sitting down on and standing up from a chair with arms (e.g., wheelchair, bedside commode, etc,.)?: Total Help needed moving to and from a bed to chair  (including a wheelchair)?: A Little Help needed walking in hospital room?: A Lot Help needed climbing 3-5 steps with a railing? : A Lot 6 Click Score: 10    End of Session Equipment Utilized During Treatment: Gait belt Activity Tolerance: Patient limited by pain;Patient limited by fatigue Patient left: in chair;with call bell/phone within reach;with family/visitor present Nurse Communication: Mobility status PT Visit Diagnosis: Unsteadiness on feet (R26.81);Repeated falls (R29.6);Difficulty in walking, not elsewhere classified (R26.2);Pain Pain - Right/Left: Right Pain - part of body:  (low back)    Time: 1410-3013 PT Time Calculation (min) (ACUTE ONLY): 49 min   Charges:   PT Evaluation $PT Eval Moderate Complexity: 1 Procedure PT Treatments $Gait Training: 8-22 mins $Self Care/Home Management: 8-22   PT G Codes:   PT G-Codes **NOT FOR INPATIENT CLASS** Functional Assessment Tool Used: AM-PAC 6 Clicks Basic Mobility Functional Limitation: Mobility: Walking and moving around Mobility: Walking and Moving Around Current Status (H4388): At least 60 percent but less than 80 percent impaired, limited or restricted Mobility: Walking and Moving Around Goal Status [phone]): At least 40 percent but less than 60 percent impaired, limited or restricted     Brittany Amirault B. Kent, [place], DPT [phone]   03/03/2017, 6:43 PM

## 2017-03-03 NOTE — Care Management Note (Signed)
Case Management Note  Patient Details  Name: BUD KAESER MRN: [id number] Date of Birth: 06/23/1931  Subjective/Objective:                    Action/Plan: Patient discharging home with his wife and orders for [facility] services. CM met with the patient and his wife and provided a list of [place] agencies. They selected [facility]. Santiago Glad with [facility] notified and accepted the referral.  Pts wife to provide transportation home.   Expected Discharge Date:  03/03/17               Expected Discharge Plan:  [place]  In-House Referral:     Discharge planning Services  CM Consult  Post Acute Care Choice:  Home Health Choice offered to:     DME Arranged:    DME Agency:     HH Arranged:  RN, PT, Nurse's Aide, Social Work [employer] Agency:  [place]  Status of Service:  Completed, signed off  If discussed at [employer], dates discussed:    Additional Comments:  Pollie Friar, RN 03/03/2017, 3:29 PM

## 2017-03-05 DIAGNOSIS — S0003XD Contusion of scalp, subsequent encounter: Secondary | ICD-10-CM | POA: Diagnosis not present

## 2017-03-05 DIAGNOSIS — Z950 Presence of cardiac pacemaker: Secondary | ICD-10-CM | POA: Diagnosis not present

## 2017-03-05 DIAGNOSIS — E119 Type 2 diabetes mellitus without complications: Secondary | ICD-10-CM | POA: Diagnosis not present

## 2017-03-05 DIAGNOSIS — Z87891 Personal history of nicotine dependence: Secondary | ICD-10-CM | POA: Diagnosis not present

## 2017-03-05 DIAGNOSIS — I442 Atrioventricular block, complete: Secondary | ICD-10-CM | POA: Diagnosis not present

## 2017-03-05 DIAGNOSIS — F0390 Unspecified dementia without behavioral disturbance: Secondary | ICD-10-CM | POA: Diagnosis not present

## 2017-03-05 DIAGNOSIS — S06360D Traumatic hemorrhage of cerebrum, unspecified, without loss of consciousness, subsequent encounter: Secondary | ICD-10-CM | POA: Diagnosis not present

## 2017-03-05 DIAGNOSIS — G4733 Obstructive sleep apnea (adult) (pediatric): Secondary | ICD-10-CM | POA: Diagnosis not present

## 2017-03-05 DIAGNOSIS — Z85828 Personal history of other malignant neoplasm of skin: Secondary | ICD-10-CM | POA: Diagnosis not present

## 2017-03-05 DIAGNOSIS — I10 Essential (primary) hypertension: Secondary | ICD-10-CM | POA: Diagnosis not present

## 2017-03-05 DIAGNOSIS — W19XXXD Unspecified fall, subsequent encounter: Secondary | ICD-10-CM | POA: Diagnosis not present

## 2017-03-05 DIAGNOSIS — D510 Vitamin B12 deficiency anemia due to intrinsic factor deficiency: Secondary | ICD-10-CM | POA: Diagnosis not present

## 2017-03-05 DIAGNOSIS — I4891 Unspecified atrial fibrillation: Secondary | ICD-10-CM | POA: Diagnosis not present

## 2017-03-05 DIAGNOSIS — Z8673 Personal history of transient ischemic attack (TIA), and cerebral infarction without residual deficits: Secondary | ICD-10-CM | POA: Diagnosis not present

## 2017-03-06 DIAGNOSIS — S0003XD Contusion of scalp, subsequent encounter: Secondary | ICD-10-CM | POA: Diagnosis not present

## 2017-03-06 DIAGNOSIS — E119 Type 2 diabetes mellitus without complications: Secondary | ICD-10-CM | POA: Diagnosis not present

## 2017-03-06 DIAGNOSIS — I4891 Unspecified atrial fibrillation: Secondary | ICD-10-CM | POA: Diagnosis not present

## 2017-03-06 DIAGNOSIS — S06360D Traumatic hemorrhage of cerebrum, unspecified, without loss of consciousness, subsequent encounter: Secondary | ICD-10-CM | POA: Diagnosis not present

## 2017-03-06 DIAGNOSIS — F0390 Unspecified dementia without behavioral disturbance: Secondary | ICD-10-CM | POA: Diagnosis not present

## 2017-03-06 DIAGNOSIS — D510 Vitamin B12 deficiency anemia due to intrinsic factor deficiency: Secondary | ICD-10-CM | POA: Diagnosis not present

## 2017-03-09 DIAGNOSIS — I4891 Unspecified atrial fibrillation: Secondary | ICD-10-CM | POA: Diagnosis not present

## 2017-03-09 DIAGNOSIS — D510 Vitamin B12 deficiency anemia due to intrinsic factor deficiency: Secondary | ICD-10-CM | POA: Diagnosis not present

## 2017-03-09 DIAGNOSIS — S0003XD Contusion of scalp, subsequent encounter: Secondary | ICD-10-CM | POA: Diagnosis not present

## 2017-03-09 DIAGNOSIS — E119 Type 2 diabetes mellitus without complications: Secondary | ICD-10-CM | POA: Diagnosis not present

## 2017-03-09 DIAGNOSIS — F0390 Unspecified dementia without behavioral disturbance: Secondary | ICD-10-CM | POA: Diagnosis not present

## 2017-03-09 DIAGNOSIS — S06360D Traumatic hemorrhage of cerebrum, unspecified, without loss of consciousness, subsequent encounter: Secondary | ICD-10-CM | POA: Diagnosis not present

## 2017-03-10 DIAGNOSIS — S06360D Traumatic hemorrhage of cerebrum, unspecified, without loss of consciousness, subsequent encounter: Secondary | ICD-10-CM | POA: Diagnosis not present

## 2017-03-10 DIAGNOSIS — I4891 Unspecified atrial fibrillation: Secondary | ICD-10-CM | POA: Diagnosis not present

## 2017-03-10 DIAGNOSIS — F0390 Unspecified dementia without behavioral disturbance: Secondary | ICD-10-CM | POA: Diagnosis not present

## 2017-03-10 DIAGNOSIS — D510 Vitamin B12 deficiency anemia due to intrinsic factor deficiency: Secondary | ICD-10-CM | POA: Diagnosis not present

## 2017-03-10 DIAGNOSIS — E119 Type 2 diabetes mellitus without complications: Secondary | ICD-10-CM | POA: Diagnosis not present

## 2017-03-10 DIAGNOSIS — S0003XD Contusion of scalp, subsequent encounter: Secondary | ICD-10-CM | POA: Diagnosis not present

## 2017-03-11 ENCOUNTER — Other Ambulatory Visit (HOSPITAL_COMMUNITY)
Admission: AD | Admit: 2017-03-11 | Discharge: 2017-03-11 | Disposition: A | Discharge status: Home / Self Care | Payer: Medicare Other | Source: Skilled Nursing Facility | Attending: Internal Medicine | Admitting: Internal Medicine

## 2017-03-11 DIAGNOSIS — D510 Vitamin B12 deficiency anemia due to intrinsic factor deficiency: Secondary | ICD-10-CM | POA: Diagnosis not present

## 2017-03-11 DIAGNOSIS — N390 Urinary tract infection, site not specified: Secondary | ICD-10-CM | POA: Diagnosis not present

## 2017-03-11 DIAGNOSIS — F0390 Unspecified dementia without behavioral disturbance: Secondary | ICD-10-CM | POA: Diagnosis not present

## 2017-03-11 DIAGNOSIS — S0003XD Contusion of scalp, subsequent encounter: Secondary | ICD-10-CM | POA: Diagnosis not present

## 2017-03-11 DIAGNOSIS — I4891 Unspecified atrial fibrillation: Secondary | ICD-10-CM | POA: Diagnosis not present

## 2017-03-11 DIAGNOSIS — S06360D Traumatic hemorrhage of cerebrum, unspecified, without loss of consciousness, subsequent encounter: Secondary | ICD-10-CM | POA: Diagnosis not present

## 2017-03-11 DIAGNOSIS — E119 Type 2 diabetes mellitus without complications: Secondary | ICD-10-CM | POA: Diagnosis not present

## 2017-03-11 LAB — URINALYSIS, ROUTINE W REFLEX MICROSCOPIC
BILIRUBIN URINE: NEGATIVE
Glucose, UA: NEGATIVE mg/dL
KETONES UR: NEGATIVE mg/dL
LEUKOCYTES UA: NEGATIVE
Nitrite: NEGATIVE
PH: 5 (ref 5.0–8.0)
PROTEIN: 100 mg/dL — AB
SQUAMOUS EPITHELIAL / LPF: NONE SEEN
Specific Gravity, Urine: 1.016 (ref 1.005–1.030)

## 2017-03-13 DIAGNOSIS — S0003XD Contusion of scalp, subsequent encounter: Secondary | ICD-10-CM | POA: Diagnosis not present

## 2017-03-13 DIAGNOSIS — F0390 Unspecified dementia without behavioral disturbance: Secondary | ICD-10-CM | POA: Diagnosis not present

## 2017-03-13 DIAGNOSIS — E119 Type 2 diabetes mellitus without complications: Secondary | ICD-10-CM | POA: Diagnosis not present

## 2017-03-13 DIAGNOSIS — D510 Vitamin B12 deficiency anemia due to intrinsic factor deficiency: Secondary | ICD-10-CM | POA: Diagnosis not present

## 2017-03-13 DIAGNOSIS — I4891 Unspecified atrial fibrillation: Secondary | ICD-10-CM | POA: Diagnosis not present

## 2017-03-13 DIAGNOSIS — S06360D Traumatic hemorrhage of cerebrum, unspecified, without loss of consciousness, subsequent encounter: Secondary | ICD-10-CM | POA: Diagnosis not present

## 2017-03-13 LAB — URINE CULTURE

## 2017-03-16 DIAGNOSIS — D510 Vitamin B12 deficiency anemia due to intrinsic factor deficiency: Secondary | ICD-10-CM | POA: Diagnosis not present

## 2017-03-16 DIAGNOSIS — I4891 Unspecified atrial fibrillation: Secondary | ICD-10-CM | POA: Diagnosis not present

## 2017-03-16 DIAGNOSIS — S0003XD Contusion of scalp, subsequent encounter: Secondary | ICD-10-CM | POA: Diagnosis not present

## 2017-03-16 DIAGNOSIS — E119 Type 2 diabetes mellitus without complications: Secondary | ICD-10-CM | POA: Diagnosis not present

## 2017-03-16 DIAGNOSIS — S06360D Traumatic hemorrhage of cerebrum, unspecified, without loss of consciousness, subsequent encounter: Secondary | ICD-10-CM | POA: Diagnosis not present

## 2017-03-16 DIAGNOSIS — F0390 Unspecified dementia without behavioral disturbance: Secondary | ICD-10-CM | POA: Diagnosis not present

## 2017-03-17 ENCOUNTER — Ambulatory Visit (HOSPITAL_COMMUNITY)
Admission: RE | Admit: 2017-03-17 | Discharge: 2017-03-17 | Disposition: A | Discharge status: Home / Self Care | Payer: Medicare Other | Source: Ambulatory Visit | Attending: Internal Medicine | Admitting: Internal Medicine

## 2017-03-17 DIAGNOSIS — G319 Degenerative disease of nervous system, unspecified: Secondary | ICD-10-CM | POA: Diagnosis not present

## 2017-03-17 DIAGNOSIS — S06300A Unspecified focal traumatic brain injury without loss of consciousness, initial encounter: Secondary | ICD-10-CM | POA: Diagnosis not present

## 2017-03-17 DIAGNOSIS — I619 Nontraumatic intracerebral hemorrhage, unspecified: Secondary | ICD-10-CM | POA: Diagnosis not present

## 2017-03-17 DIAGNOSIS — X58XXXA Exposure to other specified factors, initial encounter: Secondary | ICD-10-CM | POA: Diagnosis not present

## 2017-03-18 DIAGNOSIS — E119 Type 2 diabetes mellitus without complications: Secondary | ICD-10-CM | POA: Diagnosis not present

## 2017-03-18 DIAGNOSIS — S06360D Traumatic hemorrhage of cerebrum, unspecified, without loss of consciousness, subsequent encounter: Secondary | ICD-10-CM | POA: Diagnosis not present

## 2017-03-18 DIAGNOSIS — F0390 Unspecified dementia without behavioral disturbance: Secondary | ICD-10-CM | POA: Diagnosis not present

## 2017-03-18 DIAGNOSIS — S0003XD Contusion of scalp, subsequent encounter: Secondary | ICD-10-CM | POA: Diagnosis not present

## 2017-03-18 DIAGNOSIS — D510 Vitamin B12 deficiency anemia due to intrinsic factor deficiency: Secondary | ICD-10-CM | POA: Diagnosis not present

## 2017-03-18 DIAGNOSIS — I4891 Unspecified atrial fibrillation: Secondary | ICD-10-CM | POA: Diagnosis not present

## 2017-03-20 DIAGNOSIS — E119 Type 2 diabetes mellitus without complications: Secondary | ICD-10-CM | POA: Diagnosis not present

## 2017-03-20 DIAGNOSIS — F0390 Unspecified dementia without behavioral disturbance: Secondary | ICD-10-CM | POA: Diagnosis not present

## 2017-03-20 DIAGNOSIS — S0003XD Contusion of scalp, subsequent encounter: Secondary | ICD-10-CM | POA: Diagnosis not present

## 2017-03-20 DIAGNOSIS — I4891 Unspecified atrial fibrillation: Secondary | ICD-10-CM | POA: Diagnosis not present

## 2017-03-20 DIAGNOSIS — S06360D Traumatic hemorrhage of cerebrum, unspecified, without loss of consciousness, subsequent encounter: Secondary | ICD-10-CM | POA: Diagnosis not present

## 2017-03-20 DIAGNOSIS — D510 Vitamin B12 deficiency anemia due to intrinsic factor deficiency: Secondary | ICD-10-CM | POA: Diagnosis not present

## 2017-03-23 DIAGNOSIS — E119 Type 2 diabetes mellitus without complications: Secondary | ICD-10-CM | POA: Diagnosis not present

## 2017-03-23 DIAGNOSIS — D510 Vitamin B12 deficiency anemia due to intrinsic factor deficiency: Secondary | ICD-10-CM | POA: Diagnosis not present

## 2017-03-23 DIAGNOSIS — S06360D Traumatic hemorrhage of cerebrum, unspecified, without loss of consciousness, subsequent encounter: Secondary | ICD-10-CM | POA: Diagnosis not present

## 2017-03-23 DIAGNOSIS — T1490XA Injury, unspecified, initial encounter: Secondary | ICD-10-CM | POA: Diagnosis not present

## 2017-03-23 DIAGNOSIS — F0390 Unspecified dementia without behavioral disturbance: Secondary | ICD-10-CM | POA: Diagnosis not present

## 2017-03-23 DIAGNOSIS — I4891 Unspecified atrial fibrillation: Secondary | ICD-10-CM | POA: Diagnosis not present

## 2017-03-23 DIAGNOSIS — S0003XD Contusion of scalp, subsequent encounter: Secondary | ICD-10-CM | POA: Diagnosis not present

## 2017-03-24 DIAGNOSIS — D510 Vitamin B12 deficiency anemia due to intrinsic factor deficiency: Secondary | ICD-10-CM | POA: Diagnosis not present

## 2017-03-24 DIAGNOSIS — S0003XD Contusion of scalp, subsequent encounter: Secondary | ICD-10-CM | POA: Diagnosis not present

## 2017-03-24 DIAGNOSIS — S06360D Traumatic hemorrhage of cerebrum, unspecified, without loss of consciousness, subsequent encounter: Secondary | ICD-10-CM | POA: Diagnosis not present

## 2017-03-24 DIAGNOSIS — I4891 Unspecified atrial fibrillation: Secondary | ICD-10-CM | POA: Diagnosis not present

## 2017-03-24 DIAGNOSIS — E119 Type 2 diabetes mellitus without complications: Secondary | ICD-10-CM | POA: Diagnosis not present

## 2017-03-24 DIAGNOSIS — F0390 Unspecified dementia without behavioral disturbance: Secondary | ICD-10-CM | POA: Diagnosis not present

## 2017-03-25 DIAGNOSIS — S0003XD Contusion of scalp, subsequent encounter: Secondary | ICD-10-CM | POA: Diagnosis not present

## 2017-03-25 DIAGNOSIS — D510 Vitamin B12 deficiency anemia due to intrinsic factor deficiency: Secondary | ICD-10-CM | POA: Diagnosis not present

## 2017-03-25 DIAGNOSIS — E119 Type 2 diabetes mellitus without complications: Secondary | ICD-10-CM | POA: Diagnosis not present

## 2017-03-25 DIAGNOSIS — F0390 Unspecified dementia without behavioral disturbance: Secondary | ICD-10-CM | POA: Diagnosis not present

## 2017-03-25 DIAGNOSIS — I4891 Unspecified atrial fibrillation: Secondary | ICD-10-CM | POA: Diagnosis not present

## 2017-03-25 DIAGNOSIS — S06360D Traumatic hemorrhage of cerebrum, unspecified, without loss of consciousness, subsequent encounter: Secondary | ICD-10-CM | POA: Diagnosis not present

## 2017-03-26 DIAGNOSIS — S06360D Traumatic hemorrhage of cerebrum, unspecified, without loss of consciousness, subsequent encounter: Secondary | ICD-10-CM | POA: Diagnosis not present

## 2017-03-26 DIAGNOSIS — D510 Vitamin B12 deficiency anemia due to intrinsic factor deficiency: Secondary | ICD-10-CM | POA: Diagnosis not present

## 2017-03-26 DIAGNOSIS — S0003XD Contusion of scalp, subsequent encounter: Secondary | ICD-10-CM | POA: Diagnosis not present

## 2017-03-26 DIAGNOSIS — F0390 Unspecified dementia without behavioral disturbance: Secondary | ICD-10-CM | POA: Diagnosis not present

## 2017-03-26 DIAGNOSIS — E119 Type 2 diabetes mellitus without complications: Secondary | ICD-10-CM | POA: Diagnosis not present

## 2017-03-26 DIAGNOSIS — I4891 Unspecified atrial fibrillation: Secondary | ICD-10-CM | POA: Diagnosis not present

## 2017-03-27 DIAGNOSIS — S06341A Traumatic hemorrhage of right cerebrum with loss of consciousness of 30 minutes or less, initial encounter: Secondary | ICD-10-CM | POA: Diagnosis not present

## 2017-03-27 DIAGNOSIS — F0390 Unspecified dementia without behavioral disturbance: Secondary | ICD-10-CM | POA: Diagnosis not present

## 2017-03-30 DIAGNOSIS — D510 Vitamin B12 deficiency anemia due to intrinsic factor deficiency: Secondary | ICD-10-CM | POA: Diagnosis not present

## 2017-03-30 DIAGNOSIS — S06360D Traumatic hemorrhage of cerebrum, unspecified, without loss of consciousness, subsequent encounter: Secondary | ICD-10-CM | POA: Diagnosis not present

## 2017-03-30 DIAGNOSIS — I4891 Unspecified atrial fibrillation: Secondary | ICD-10-CM | POA: Diagnosis not present

## 2017-03-30 DIAGNOSIS — S0003XD Contusion of scalp, subsequent encounter: Secondary | ICD-10-CM | POA: Diagnosis not present

## 2017-03-30 DIAGNOSIS — F0390 Unspecified dementia without behavioral disturbance: Secondary | ICD-10-CM | POA: Diagnosis not present

## 2017-03-30 DIAGNOSIS — E119 Type 2 diabetes mellitus without complications: Secondary | ICD-10-CM | POA: Diagnosis not present

## 2017-03-31 DIAGNOSIS — D510 Vitamin B12 deficiency anemia due to intrinsic factor deficiency: Secondary | ICD-10-CM | POA: Diagnosis not present

## 2017-03-31 DIAGNOSIS — S0003XD Contusion of scalp, subsequent encounter: Secondary | ICD-10-CM | POA: Diagnosis not present

## 2017-03-31 DIAGNOSIS — E119 Type 2 diabetes mellitus without complications: Secondary | ICD-10-CM | POA: Diagnosis not present

## 2017-03-31 DIAGNOSIS — S06360D Traumatic hemorrhage of cerebrum, unspecified, without loss of consciousness, subsequent encounter: Secondary | ICD-10-CM | POA: Diagnosis not present

## 2017-03-31 DIAGNOSIS — F0390 Unspecified dementia without behavioral disturbance: Secondary | ICD-10-CM | POA: Diagnosis not present

## 2017-03-31 DIAGNOSIS — I4891 Unspecified atrial fibrillation: Secondary | ICD-10-CM | POA: Diagnosis not present

## 2017-04-01 ENCOUNTER — Encounter: Payer: Medicare Other | Admitting: *Deleted

## 2017-04-02 ENCOUNTER — Encounter: Payer: Self-pay | Admitting: Cardiology

## 2017-04-02 DIAGNOSIS — F418 Other specified anxiety disorders: Secondary | ICD-10-CM | POA: Diagnosis not present

## 2017-04-02 DIAGNOSIS — I480 Paroxysmal atrial fibrillation: Secondary | ICD-10-CM | POA: Diagnosis not present

## 2017-04-02 DIAGNOSIS — Z7901 Long term (current) use of anticoagulants: Secondary | ICD-10-CM | POA: Diagnosis not present

## 2017-04-02 DIAGNOSIS — I5021 Acute systolic (congestive) heart failure: Secondary | ICD-10-CM | POA: Diagnosis not present

## 2017-04-02 DIAGNOSIS — Z6828 Body mass index (BMI) 28.0-28.9, adult: Secondary | ICD-10-CM | POA: Diagnosis not present

## 2017-04-02 DIAGNOSIS — E1122 Type 2 diabetes mellitus with diabetic chronic kidney disease: Secondary | ICD-10-CM | POA: Diagnosis not present

## 2017-04-02 DIAGNOSIS — I4891 Unspecified atrial fibrillation: Secondary | ICD-10-CM | POA: Diagnosis not present

## 2017-04-02 DIAGNOSIS — I629 Nontraumatic intracranial hemorrhage, unspecified: Secondary | ICD-10-CM | POA: Diagnosis not present

## 2017-04-02 DIAGNOSIS — D62 Acute posthemorrhagic anemia: Secondary | ICD-10-CM | POA: Diagnosis not present

## 2017-04-02 DIAGNOSIS — F0390 Unspecified dementia without behavioral disturbance: Secondary | ICD-10-CM | POA: Diagnosis not present

## 2017-04-02 DIAGNOSIS — S06360D Traumatic hemorrhage of cerebrum, unspecified, without loss of consciousness, subsequent encounter: Secondary | ICD-10-CM | POA: Diagnosis not present

## 2017-04-02 DIAGNOSIS — E119 Type 2 diabetes mellitus without complications: Secondary | ICD-10-CM | POA: Diagnosis not present

## 2017-04-02 DIAGNOSIS — R413 Other amnesia: Secondary | ICD-10-CM | POA: Diagnosis not present

## 2017-04-02 DIAGNOSIS — D510 Vitamin B12 deficiency anemia due to intrinsic factor deficiency: Secondary | ICD-10-CM | POA: Diagnosis not present

## 2017-04-02 DIAGNOSIS — E538 Deficiency of other specified B group vitamins: Secondary | ICD-10-CM | POA: Diagnosis not present

## 2017-04-02 DIAGNOSIS — R296 Repeated falls: Secondary | ICD-10-CM | POA: Diagnosis not present

## 2017-04-02 DIAGNOSIS — I10 Essential (primary) hypertension: Secondary | ICD-10-CM | POA: Diagnosis not present

## 2017-04-02 DIAGNOSIS — S0003XD Contusion of scalp, subsequent encounter: Secondary | ICD-10-CM | POA: Diagnosis not present

## 2017-04-03 ENCOUNTER — Ambulatory Visit (INDEPENDENT_AMBULATORY_CARE_PROVIDER_SITE_OTHER): Payer: Medicare Other | Admitting: *Deleted

## 2017-04-03 DIAGNOSIS — I4891 Unspecified atrial fibrillation: Secondary | ICD-10-CM | POA: Diagnosis not present

## 2017-04-03 DIAGNOSIS — S06360D Traumatic hemorrhage of cerebrum, unspecified, without loss of consciousness, subsequent encounter: Secondary | ICD-10-CM | POA: Diagnosis not present

## 2017-04-03 DIAGNOSIS — I442 Atrioventricular block, complete: Secondary | ICD-10-CM | POA: Diagnosis not present

## 2017-04-03 DIAGNOSIS — F0390 Unspecified dementia without behavioral disturbance: Secondary | ICD-10-CM | POA: Diagnosis not present

## 2017-04-03 DIAGNOSIS — S0003XD Contusion of scalp, subsequent encounter: Secondary | ICD-10-CM | POA: Diagnosis not present

## 2017-04-03 DIAGNOSIS — E119 Type 2 diabetes mellitus without complications: Secondary | ICD-10-CM | POA: Diagnosis not present

## 2017-04-03 DIAGNOSIS — D510 Vitamin B12 deficiency anemia due to intrinsic factor deficiency: Secondary | ICD-10-CM | POA: Diagnosis not present

## 2017-04-08 ENCOUNTER — Telehealth: Payer: Self-pay | Admitting: Internal Medicine

## 2017-04-08 DIAGNOSIS — F0390 Unspecified dementia without behavioral disturbance: Secondary | ICD-10-CM | POA: Diagnosis not present

## 2017-04-08 DIAGNOSIS — D510 Vitamin B12 deficiency anemia due to intrinsic factor deficiency: Secondary | ICD-10-CM | POA: Diagnosis not present

## 2017-04-08 DIAGNOSIS — E119 Type 2 diabetes mellitus without complications: Secondary | ICD-10-CM | POA: Diagnosis not present

## 2017-04-08 DIAGNOSIS — S06360D Traumatic hemorrhage of cerebrum, unspecified, without loss of consciousness, subsequent encounter: Secondary | ICD-10-CM | POA: Diagnosis not present

## 2017-04-08 DIAGNOSIS — I4891 Unspecified atrial fibrillation: Secondary | ICD-10-CM | POA: Diagnosis not present

## 2017-04-08 DIAGNOSIS — S0003XD Contusion of scalp, subsequent encounter: Secondary | ICD-10-CM | POA: Diagnosis not present

## 2017-04-08 NOTE — Telephone Encounter (Signed)
New message      Calling to see if we received his remote device check

## 2017-04-08 NOTE — Telephone Encounter (Signed)
Spoke with Richard [name] informed her that transmission was received

## 2017-04-12 ENCOUNTER — Emergency Department (HOSPITAL_COMMUNITY)
Admission: EM | Admit: 2017-04-12 | Discharge: 2017-04-12 | Disposition: A | Discharge status: Home / Self Care | Payer: Medicare Other | Attending: Emergency Medicine | Admitting: Emergency Medicine

## 2017-04-12 ENCOUNTER — Encounter (HOSPITAL_COMMUNITY): Payer: Self-pay | Admitting: Emergency Medicine

## 2017-04-12 DIAGNOSIS — R195 Other fecal abnormalities: Secondary | ICD-10-CM | POA: Diagnosis not present

## 2017-04-12 DIAGNOSIS — Z8673 Personal history of transient ischemic attack (TIA), and cerebral infarction without residual deficits: Secondary | ICD-10-CM | POA: Insufficient documentation

## 2017-04-12 DIAGNOSIS — N189 Chronic kidney disease, unspecified: Secondary | ICD-10-CM | POA: Insufficient documentation

## 2017-04-12 DIAGNOSIS — I130 Hypertensive heart and chronic kidney disease with heart failure and stage 1 through stage 4 chronic kidney disease, or unspecified chronic kidney disease: Secondary | ICD-10-CM | POA: Insufficient documentation

## 2017-04-12 DIAGNOSIS — F419 Anxiety disorder, unspecified: Secondary | ICD-10-CM | POA: Diagnosis not present

## 2017-04-12 DIAGNOSIS — Z950 Presence of cardiac pacemaker: Secondary | ICD-10-CM | POA: Insufficient documentation

## 2017-04-12 DIAGNOSIS — Z9852 Vasectomy status: Secondary | ICD-10-CM | POA: Insufficient documentation

## 2017-04-12 DIAGNOSIS — Z85828 Personal history of other malignant neoplasm of skin: Secondary | ICD-10-CM | POA: Diagnosis not present

## 2017-04-12 DIAGNOSIS — I5032 Chronic diastolic (congestive) heart failure: Secondary | ICD-10-CM | POA: Insufficient documentation

## 2017-04-12 DIAGNOSIS — Z79899 Other long term (current) drug therapy: Secondary | ICD-10-CM | POA: Diagnosis not present

## 2017-04-12 DIAGNOSIS — Z87891 Personal history of nicotine dependence: Secondary | ICD-10-CM | POA: Diagnosis not present

## 2017-04-12 DIAGNOSIS — E1122 Type 2 diabetes mellitus with diabetic chronic kidney disease: Secondary | ICD-10-CM | POA: Insufficient documentation

## 2017-04-12 DIAGNOSIS — K921 Melena: Secondary | ICD-10-CM | POA: Diagnosis not present

## 2017-04-12 LAB — COMPREHENSIVE METABOLIC PANEL
ALBUMIN: 2.8 g/dL — AB (ref 3.5–5.0)
ALK PHOS: 106 U/L (ref 38–126)
ALT: 30 U/L (ref 17–63)
ANION GAP: 7 (ref 5–15)
AST: 44 U/L — ABNORMAL HIGH (ref 15–41)
BUN: 15 mg/dL (ref 6–20)
CALCIUM: 8.4 mg/dL — AB (ref 8.9–10.3)
CO2: 23 mmol/L (ref 22–32)
Chloride: 103 mmol/L (ref 101–111)
Creatinine, Ser: 1.07 mg/dL (ref 0.61–1.24)
GFR calc Af Amer: >60 mL/min (ref >60)
GFR calc non Af Amer: >60 mL/min (ref >60)
GLUCOSE: 177 mg/dL — AB (ref 65–99)
Potassium: 3.4 mmol/L — ABNORMAL LOW (ref 3.5–5.1)
SODIUM: 133 mmol/L — AB (ref 135–145)
Total Bilirubin: 0.6 mg/dL (ref 0.3–1.2)
Total Protein: 7 g/dL (ref 6.5–8.1)

## 2017-04-12 LAB — CBC
HEMATOCRIT: 32.1 % — AB (ref 39.0–52.0)
HEMOGLOBIN: 9.9 g/dL — AB (ref 13.0–17.0)
MCH: 27 pg (ref 26.0–34.0)
MCHC: 30.8 g/dL (ref 30.0–36.0)
MCV: 87.7 fL (ref 78.0–100.0)
Platelets: 174 10*3/uL (ref 150–400)
RBC: 3.66 MIL/uL — ABNORMAL LOW (ref 4.22–5.81)
RDW: 17.5 % — ABNORMAL HIGH (ref 11.5–15.5)
WBC: 5.7 10*3/uL (ref 4.0–10.5)

## 2017-04-12 LAB — TYPE AND SCREEN
ABO/RH(D): O POS
Antibody Screen: NEGATIVE

## 2017-04-12 NOTE — ED Triage Notes (Signed)
The patient was started on iron last Thursday and this morning at 0400 he had a BM that was dark.  The patient's wife said she called hid MD and was told it could be the iron or blood.  The patient's MD told them to go to UC or the ED.  The patient denies pain or any other symptoms.

## 2017-04-12 NOTE — ED Notes (Signed)
ED Provider at bedside. 

## 2017-04-12 NOTE — ED Provider Notes (Signed)
[facility] DEPT Provider Note   CSN: [id number] Arrival date & time: 04/12/17  1102     History   Chief Complaint Chief Complaint  Patient presents with  . Melena    HPI Richard [name] is a 83 y.o. male. Chief complaint is black stools, recently started on iron for anemia.  HPI: Richard [name] is 66. He was found to be anemic earlier this year. According to he and his wife, he underwent extensive evaluation without etiology. He was placed on iron recently by his primary care physician. He states any noticeable black stool last night, and again this morning. No bloody stools. He states he feels "fine". No weakness, no orthostasis. No chest pain or dyspnea.  Past Medical History:  Diagnosis Date  . Anxiety   . Arthritis   . Atrial fibrillation ([place])    a. Dx 03/2013, notes report atrial fibrillation/atrial flutter, placed on amiodarone. NSR in subsequent OV's.  . B12 deficiency anemia   . Chest pain    a. H/o CTA negative for PE 2012, normal cath 2005, normal nucs previously including 05/2012.  Marland Kitchen Complete heart block (HCC)    a. s/p Medtronic Adapta L model ADDRL 1 (serial number [id number]) pacemaker.  . Dementia   . GERD (gastroesophageal reflux disease)   . Hiatal hernia   . Hyperlipidemia   . Hypertension   . Iron deficiency anemia   . LBBB (left bundle branch block)   . Microcytic anemia   . Nephrolithiasis   . On home oxygen therapy    a. 2L w/CPAP at night  . Orthostatic hypotension   . OSA on CPAP    setting = 4  . Pacemaker   . [place] spotted fever ~ 1945  . Sinus drainage   . Skin cancer of lip   . Stroke ([place]) 05/2016  . Type II diabetes mellitus [facility])     Patient Active Problem List   Diagnosis Date Noted  . Intracranial hemorrhage ([place]) 03/03/2017  . Head trauma 03/02/2017  . Chronic diastolic CHF (congestive heart failure) ([place]) 11/16/2016  . AVM (arteriovenous malformation) of small bowel, acquired ([place])   . Abnormality of gait as  late effect of stroke 05/27/2016  . Benign neoplasm of cecum   . Benign neoplasm of sigmoid colon   . GI bleeding 05/22/2016  . Melena   . GIB (gastrointestinal bleeding) 05/21/2016  . Diabetes mellitus with complication ([place])   . Upper GI bleed   . CKD (chronic kidney disease)   . Type 2 diabetes mellitus with circulatory disorder, without long-term current use of insulin ([place])   . Acute right PCA stroke ([place]) 05/08/2016  . Ataxia due to recent stroke   . Gait disturbance, post-stroke   . Cerebellar stroke, acute ([place]) 05/07/2016  . Hyperlipidemia   . Chronic anticoagulation   . Cardiac pacemaker in situ   . Acute blood loss anemia   . Cognitive deficits   . Cognitive deficit due to recent stroke   . Stroke-related cognitive dysfunction   . Generalized weakness 05/05/2016  . Dizziness   . Absolute anemia 02/08/2016  . Weakness 02/01/2016  . Gastric polyp 06/21/2015  . Iron deficiency anemia due to chronic blood loss   . GERD (gastroesophageal reflux disease)   . B12 deficiency anemia   . Bradycardia with less than 30 beats per minute 05/31/2014  . Complete heart block ([place]) 05/31/2014  . Syncope 05/31/2014  . Anemia 05/31/2014  . Atrial fibrillation ([place]) 04/08/2013  .  Diabetes mellitus ([place]) 04/06/2013  . Hiatal hernia 04/06/2013  . Essential hypertension 06/13/2011  . OSA (obstructive sleep apnea) 06/13/2011    Past Surgical History:  Procedure Laterality Date  . CARDIAC CATHETERIZATION     by Dr. Acie Fredrickson, January 24, 2004, that shows minimal coronary artery irregularities and normal left ventricular function  . CATARACT EXTRACTION W/ INTRAOCULAR LENS  IMPLANT, BILATERAL Bilateral   . COLONOSCOPY WITH PROPOFOL N/A 05/23/2016   Procedure: COLONOSCOPY WITH PROPOFOL;  Surgeon: Jerene Bears, MD;  Location: [facility];  Service: Gastroenterology;  Laterality: N/A;  . ENTEROSCOPY N/A 06/02/2016   Procedure: ENTEROSCOPY;  Surgeon: Gatha Mayer, MD;  Location: [facility] ENDOSCOPY;   Service: Endoscopy;  Laterality: N/A;  . ESOPHAGOGASTRODUODENOSCOPY N/A 05/22/2016   Procedure: ESOPHAGOGASTRODUODENOSCOPY (EGD);  Surgeon: Jerene Bears, MD;  Location[facility] ENDOSCOPY;  Service: Endoscopy;  Laterality: N/A;  . ESOPHAGOGASTRODUODENOSCOPY (EGD) WITH PROPOFOL N/A 06/21/2015   Procedure: ESOPHAGOGASTRODUODENOSCOPY (EGD) WITH PROPOFOL;  Surgeon: Gatha Mayer, MD;  Location: [facility] ENDOSCOPY;  Service: Endoscopy;  Laterality: N/A;  . GIVENS CAPSULE STUDY N/A 05/23/2016   Procedure: GIVENS CAPSULE STUDY;  Surgeon: Jerene Bears, MD;  Location: [facility];  Service: Gastroenterology;  Laterality: N/A;  . HOT HEMOSTASIS N/A 06/02/2016   Procedure: HOT HEMOSTASIS (ARGON PLASMA COAGULATION/BICAP);  Surgeon: Gatha Mayer, MD;  Location: [facility];  Service: Endoscopy;  Laterality: N/A;  . INGUINAL HERNIA REPAIR Right   . IR RADIOLOGIST EVAL & MGMT  11/25/2016  . IR RADIOLOGIST EVAL & MGMT  01/22/2017  . IR VERTEBROPLASTY CERV/THOR BX INC UNI/BIL INC/INJECT/IMAGING  01/30/2017  . IR VERTEBROPLASTY EA ADDL (T&LS) BX INC UNI/BIL INC INJECT/IMAGING  11/28/2016  . IR VERTEBROPLASTY EA ADDL (T&LS) BX INC UNI/BIL INC INJECT/IMAGING  01/30/2017  . IR VERTEBROPLASTY LUMBAR BX INC UNI/BIL INC/INJECT/IMAGING  11/28/2016  . LUMBAR DISC SURGERY  ~ 1993  . PERMANENT PACEMAKER INSERTION N/A 06/03/2014   MDT Adapta L implanted by Dr Rayann Heman for syncope and transient AV block  . SKIN CANCER EXCISION     "lower lip" (04/06/2013)  . TEMPORARY PACEMAKER INSERTION N/A 05/31/2014   Procedure: TEMPORARY WIRE;  Surgeon: Sinclair Grooms, MD;  [facility] C[facility] LAB;  Service: Cardiovascular;  Laterality: N/A;  . VASECTOMY     Hx of     OB History    No data available       Home Medications    Prior to Admission medications   Medication Sig Start Date End Date Taking? Authorizing Provider  acetaminophen (TYLENOL) 500 MG tablet Take 500 mg by mouth 2 (two) times daily.    Yes [provider]  amiodarone  (PACERONE) 200 MG tablet Take 200 mg by mouth every Monday, Wednesday, and Friday.    Yes [provider]  atorvastatin (LIPITOR) 40 MG tablet Take 1 tablet (40 mg total) by mouth daily at 6 PM. 05/16/16  Yes Love, Ivan Anchors, PA-C  cyanocobalamin (,VITAMIN B-12,) 1000 MCG/ML injection Inject 1,000 mcg into the muscle every 30 (thirty) days.   Yes [provider]  diazepam (VALIUM) 5 MG tablet Take 5 mg by mouth at bedtime.    Yes [provider]  DULoxetine (CYMBALTA) 60 MG capsule Take 60 mg by mouth every evening.   Yes [provider]  ferrous sulfate 325 (65 FE) MG tablet Take 325 mg by mouth daily with breakfast.   Yes [provider]  fluticasone (FLONASE) 50 MCG/ACT nasal spray Place 1 spray into the  nose daily as needed for allergies or rhinitis.    Yes [provider]  levothyroxine (SYNTHROID, LEVOTHROID) 25 MCG tablet Take 25 mcg by mouth daily before breakfast.   Yes [provider]  MENTHOL, TOPICAL ANALGESIC, EX Apply 1 application topically daily as needed (muscle pain).   Yes [provider]  metoprolol (LOPRESSOR) 50 MG tablet TAKE 1 TABLET BY MOUTH TWICE A DAY Patient taking differently: Take 50 mg by mouth twice daily 07/07/16  Yes Nahser, Wonda Cheng, MD  pantoprazole (PROTONIX) 40 MG tablet Take 1 tablet (40 mg total) by mouth daily at 6 (six) AM. 05/25/16  Yes Kelvin Cellar, MD  sertraline (ZOLOFT) 50 MG tablet Take 50 mg by mouth daily.  08/21/14  Yes [provider]  furosemide (LASIX) 40 MG tablet Take one tablet daily on Monday and Thursday. Patient not taking: Reported on 04/12/2017 01/29/17   Nahser, Wonda Cheng, MD  potassium chloride (K-DUR) 10 MEQ tablet Take one tablet by mouth daily on Monday and Thursday. Patient not taking: Reported on 04/12/2017 01/29/17   Nahser, Wonda Cheng, MD    Family History Family History  Problem Relation Age of Onset  . Thyroid disease Mother        goiter  .  Pneumonia Father   . Other Unknown        Parents both died of old age; other conditions not known  . Stroke Brother   . Breast cancer Sister     Social History Social History  Substance Use Topics  . Smoking status: Former Smoker    Packs/day: 0.00    Years: 20.00    Types: Cigarettes  . Smokeless tobacco: Never Used     Comment: 04/06/2013 "quit smoking years ago; didn't smoke that much; probably 20 years; 1ppd"  . Alcohol use No     Allergies   Codeine and Citalopram   Review of Systems Review of Systems  Constitutional: Negative for appetite change, chills, diaphoresis, fatigue and fever.  HENT: Negative for mouth sores, sore throat and trouble swallowing.   Eyes: Negative for visual disturbance.  Respiratory: Negative for cough, chest tightness, shortness of breath and wheezing.   Cardiovascular: Negative for chest pain.  Gastrointestinal: Negative for abdominal distention, abdominal pain, diarrhea, nausea and vomiting.       Black stools  Endocrine: Negative for polydipsia, polyphagia and polyuria.  Genitourinary: Negative for dysuria, frequency and hematuria.  Musculoskeletal: Negative for gait problem.  Skin: Negative for color change, pallor and rash.  Neurological: Negative for dizziness, syncope, light-headedness and headaches.  Hematological: Does not bruise/bleed easily.  Psychiatric/Behavioral: Negative for behavioral problems and confusion.     Physical Exam Updated Vital Signs BP (!) 174/87 (BP Location: Right Arm)   Pulse 66   Temp 97.8 F (36.6 C) (Oral)   Resp (!) 23   Ht 6\' 2"  (1.88 m)   Wt 98.4 kg (217 lb)   SpO2 96%   BMI 27.86 kg/m   Physical Exam  Constitutional: He is oriented to person, place, and time. He appears well-developed and well-nourished. No distress.  HENT:  Head: Normocephalic.  Eyes: Pupils are equal, round, and reactive to light. Conjunctivae are normal. No scleral icterus.  Neck: Normal range of motion. Neck supple.  No thyromegaly present.  Cardiovascular: Normal rate and regular rhythm.  Exam reveals no gallop and no friction rub.   No murmur heard. Pulmonary/Chest: Effort normal and breath sounds normal. No respiratory distress. He has no wheezes. He has  no rales.  Abdominal: Soft. Bowel sounds are normal. He exhibits no distension. There is no tenderness. There is no rebound.  Musculoskeletal: Normal range of motion.  Neurological: He is alert and oriented to person, place, and time.  Skin: Skin is warm and dry. No rash noted.  Psychiatric: He has a normal mood and affect. His behavior is normal.     ED Treatments / Results  Labs (all labs ordered are listed, but only abnormal results are displayed) Labs Reviewed  COMPREHENSIVE METABOLIC PANEL - Abnormal; Notable for the following:       Result Value   Sodium 133 (*)    Potassium 3.4 (*)    Glucose, Bld 177 (*)    Calcium 8.4 (*)    Albumin 2.8 (*)    AST 44 (*)    All other components within normal limits  CBC - Abnormal; Notable for the following:    RBC 3.66 (*)    Hemoglobin 9.9 (*)    HCT 32.1 (*)    RDW 17.5 (*)    All other components within normal limits  POC OCCULT BLOOD, ED  TYPE AND SCREEN    EKG  EKG Interpretation None       Radiology No results found.  Procedures Procedures (including critical care time)  Medications Ordered in ED Medications - No data to display   Initial Impression / Assessment and Plan / ED Course  I have reviewed the triage vital signs and the nursing notes.  Pertinent labs & imaging results that were available during my care of the patient were reviewed by me and considered in my medical decision making (see chart for details).     Guaic negative dark stool. Hb 9.9. Pt was 10.0 last week.  Final Clinical Impressions(s) / ED Diagnoses   Final diagnoses:  Dark stools    Reassured. DC. PCP follow up   New Prescriptions New Prescriptions   No medications on file       Tanna Furry, MD 04/12/17 1347

## 2017-04-14 DIAGNOSIS — S06360D Traumatic hemorrhage of cerebrum, unspecified, without loss of consciousness, subsequent encounter: Secondary | ICD-10-CM | POA: Diagnosis not present

## 2017-04-14 DIAGNOSIS — I4891 Unspecified atrial fibrillation: Secondary | ICD-10-CM | POA: Diagnosis not present

## 2017-04-14 DIAGNOSIS — S0003XD Contusion of scalp, subsequent encounter: Secondary | ICD-10-CM | POA: Diagnosis not present

## 2017-04-14 DIAGNOSIS — F0390 Unspecified dementia without behavioral disturbance: Secondary | ICD-10-CM | POA: Diagnosis not present

## 2017-04-14 DIAGNOSIS — D510 Vitamin B12 deficiency anemia due to intrinsic factor deficiency: Secondary | ICD-10-CM | POA: Diagnosis not present

## 2017-04-14 DIAGNOSIS — E119 Type 2 diabetes mellitus without complications: Secondary | ICD-10-CM | POA: Diagnosis not present

## 2017-04-16 NOTE — Progress Notes (Signed)
Pacemaker remote transmission

## 2017-04-17 ENCOUNTER — Encounter: Payer: Self-pay | Admitting: Cardiology

## 2017-04-21 DIAGNOSIS — S0003XD Contusion of scalp, subsequent encounter: Secondary | ICD-10-CM | POA: Diagnosis not present

## 2017-04-21 DIAGNOSIS — I4891 Unspecified atrial fibrillation: Secondary | ICD-10-CM | POA: Diagnosis not present

## 2017-04-21 DIAGNOSIS — E119 Type 2 diabetes mellitus without complications: Secondary | ICD-10-CM | POA: Diagnosis not present

## 2017-04-21 DIAGNOSIS — D510 Vitamin B12 deficiency anemia due to intrinsic factor deficiency: Secondary | ICD-10-CM | POA: Diagnosis not present

## 2017-04-21 DIAGNOSIS — F0390 Unspecified dementia without behavioral disturbance: Secondary | ICD-10-CM | POA: Diagnosis not present

## 2017-04-21 DIAGNOSIS — S06360D Traumatic hemorrhage of cerebrum, unspecified, without loss of consciousness, subsequent encounter: Secondary | ICD-10-CM | POA: Diagnosis not present

## 2017-04-21 LAB — CUP PACEART REMOTE DEVICE CHECK
Battery Impedance: 139 Ohm
Battery Voltage: 2.79 V
Brady Statistic AS VS Percent: 79 %
Date Time Interrogation Session: 20180810140712
Implantable Lead Location: 753859
Implantable Lead Model: 5076
Lead Channel Impedance Value: 673 Ohm
Lead Channel Pacing Threshold Pulse Width: 0.4 ms
Lead Channel Setting Pacing Amplitude: 2.5 V
Lead Channel Setting Pacing Pulse Width: 0.4 ms
Lead Channel Setting Sensing Sensitivity: 5.6 mV
MDC IDC LEAD IMPLANT DT: 20151010
MDC IDC LEAD IMPLANT DT: 20151010
MDC IDC LEAD LOCATION: 753860
MDC IDC MSMT BATTERY REMAINING LONGEVITY: 146 mo
MDC IDC MSMT LEADCHNL RA IMPEDANCE VALUE: 442 Ohm
MDC IDC MSMT LEADCHNL RA PACING THRESHOLD AMPLITUDE: 0.625 V
MDC IDC MSMT LEADCHNL RA PACING THRESHOLD PULSEWIDTH: 0.4 ms
MDC IDC MSMT LEADCHNL RV PACING THRESHOLD AMPLITUDE: 0.5 V
MDC IDC PG IMPLANT DT: 20151010
MDC IDC SET LEADCHNL RA PACING AMPLITUDE: 2 V
MDC IDC STAT BRADY AP VP PERCENT: 2 %
MDC IDC STAT BRADY AP VS PERCENT: 15 %
MDC IDC STAT BRADY AS VP PERCENT: 5 %

## 2017-04-23 DIAGNOSIS — S06360D Traumatic hemorrhage of cerebrum, unspecified, without loss of consciousness, subsequent encounter: Secondary | ICD-10-CM | POA: Diagnosis not present

## 2017-04-23 DIAGNOSIS — E119 Type 2 diabetes mellitus without complications: Secondary | ICD-10-CM | POA: Diagnosis not present

## 2017-04-23 DIAGNOSIS — F0390 Unspecified dementia without behavioral disturbance: Secondary | ICD-10-CM | POA: Diagnosis not present

## 2017-04-23 DIAGNOSIS — D510 Vitamin B12 deficiency anemia due to intrinsic factor deficiency: Secondary | ICD-10-CM | POA: Diagnosis not present

## 2017-04-23 DIAGNOSIS — S0003XD Contusion of scalp, subsequent encounter: Secondary | ICD-10-CM | POA: Diagnosis not present

## 2017-04-23 DIAGNOSIS — I4891 Unspecified atrial fibrillation: Secondary | ICD-10-CM | POA: Diagnosis not present

## 2017-04-24 DIAGNOSIS — J189 Pneumonia, unspecified organism: Secondary | ICD-10-CM | POA: Diagnosis not present

## 2017-04-24 DIAGNOSIS — F329 Major depressive disorder, single episode, unspecified: Secondary | ICD-10-CM | POA: Diagnosis not present

## 2017-04-24 DIAGNOSIS — R413 Other amnesia: Secondary | ICD-10-CM | POA: Diagnosis not present

## 2017-04-24 DIAGNOSIS — Z6827 Body mass index (BMI) 27.0-27.9, adult: Secondary | ICD-10-CM | POA: Diagnosis not present

## 2017-04-24 DIAGNOSIS — I509 Heart failure, unspecified: Secondary | ICD-10-CM | POA: Diagnosis not present

## 2017-04-29 DIAGNOSIS — E119 Type 2 diabetes mellitus without complications: Secondary | ICD-10-CM | POA: Diagnosis not present

## 2017-04-29 DIAGNOSIS — S0003XD Contusion of scalp, subsequent encounter: Secondary | ICD-10-CM | POA: Diagnosis not present

## 2017-04-29 DIAGNOSIS — S06360D Traumatic hemorrhage of cerebrum, unspecified, without loss of consciousness, subsequent encounter: Secondary | ICD-10-CM | POA: Diagnosis not present

## 2017-04-29 DIAGNOSIS — F0390 Unspecified dementia without behavioral disturbance: Secondary | ICD-10-CM | POA: Diagnosis not present

## 2017-04-29 DIAGNOSIS — D510 Vitamin B12 deficiency anemia due to intrinsic factor deficiency: Secondary | ICD-10-CM | POA: Diagnosis not present

## 2017-04-29 DIAGNOSIS — I4891 Unspecified atrial fibrillation: Secondary | ICD-10-CM | POA: Diagnosis not present

## 2017-05-13 DIAGNOSIS — R634 Abnormal weight loss: Secondary | ICD-10-CM | POA: Diagnosis not present

## 2017-05-13 DIAGNOSIS — M4857XA Collapsed vertebra, not elsewhere classified, lumbosacral region, initial encounter for fracture: Secondary | ICD-10-CM | POA: Diagnosis not present

## 2017-05-13 DIAGNOSIS — I638 Other cerebral infarction: Secondary | ICD-10-CM | POA: Diagnosis not present

## 2017-05-13 DIAGNOSIS — I509 Heart failure, unspecified: Secondary | ICD-10-CM | POA: Diagnosis not present

## 2017-05-13 DIAGNOSIS — I480 Paroxysmal atrial fibrillation: Secondary | ICD-10-CM | POA: Diagnosis not present

## 2017-05-13 DIAGNOSIS — F0390 Unspecified dementia without behavioral disturbance: Secondary | ICD-10-CM | POA: Diagnosis not present

## 2017-05-13 DIAGNOSIS — R8290 Unspecified abnormal findings in urine: Secondary | ICD-10-CM | POA: Diagnosis not present

## 2017-05-13 DIAGNOSIS — E11621 Type 2 diabetes mellitus with foot ulcer: Secondary | ICD-10-CM | POA: Diagnosis not present

## 2017-05-13 DIAGNOSIS — R410 Disorientation, unspecified: Secondary | ICD-10-CM | POA: Diagnosis not present

## 2017-05-13 DIAGNOSIS — F329 Major depressive disorder, single episode, unspecified: Secondary | ICD-10-CM | POA: Diagnosis not present

## 2017-05-13 DIAGNOSIS — M4854XA Collapsed vertebra, not elsewhere classified, thoracic region, initial encounter for fracture: Secondary | ICD-10-CM | POA: Diagnosis not present

## 2017-05-13 DIAGNOSIS — Z6826 Body mass index (BMI) 26.0-26.9, adult: Secondary | ICD-10-CM | POA: Diagnosis not present

## 2017-05-16 DIAGNOSIS — S59912A Unspecified injury of left forearm, initial encounter: Secondary | ICD-10-CM | POA: Diagnosis not present

## 2017-05-22 ENCOUNTER — Emergency Department (HOSPITAL_COMMUNITY): Payer: Medicare Other

## 2017-05-22 ENCOUNTER — Encounter (HOSPITAL_COMMUNITY): Payer: Self-pay | Admitting: Emergency Medicine

## 2017-05-22 ENCOUNTER — Inpatient Hospital Stay (HOSPITAL_COMMUNITY)
Admission: EM | Admit: 2017-05-22 | Discharge: 2017-05-25 | DRG: 291 | Disposition: A | Discharge status: Home Health | Payer: Medicare Other | Attending: Internal Medicine | Admitting: Internal Medicine

## 2017-05-22 DIAGNOSIS — E1159 Type 2 diabetes mellitus with other circulatory complications: Secondary | ICD-10-CM

## 2017-05-22 DIAGNOSIS — Z961 Presence of intraocular lens: Secondary | ICD-10-CM | POA: Diagnosis present

## 2017-05-22 DIAGNOSIS — F0390 Unspecified dementia without behavioral disturbance: Secondary | ICD-10-CM | POA: Diagnosis present

## 2017-05-22 DIAGNOSIS — Z9841 Cataract extraction status, right eye: Secondary | ICD-10-CM | POA: Diagnosis not present

## 2017-05-22 DIAGNOSIS — D509 Iron deficiency anemia, unspecified: Secondary | ICD-10-CM | POA: Diagnosis present

## 2017-05-22 DIAGNOSIS — Z87891 Personal history of nicotine dependence: Secondary | ICD-10-CM

## 2017-05-22 DIAGNOSIS — I10 Essential (primary) hypertension: Secondary | ICD-10-CM

## 2017-05-22 DIAGNOSIS — Z823 Family history of stroke: Secondary | ICD-10-CM

## 2017-05-22 DIAGNOSIS — I442 Atrioventricular block, complete: Secondary | ICD-10-CM | POA: Diagnosis not present

## 2017-05-22 DIAGNOSIS — G4733 Obstructive sleep apnea (adult) (pediatric): Secondary | ICD-10-CM | POA: Diagnosis present

## 2017-05-22 DIAGNOSIS — K5520 Angiodysplasia of colon without hemorrhage: Secondary | ICD-10-CM | POA: Diagnosis present

## 2017-05-22 DIAGNOSIS — I5033 Acute on chronic diastolic (congestive) heart failure: Secondary | ICD-10-CM | POA: Diagnosis present

## 2017-05-22 DIAGNOSIS — N183 Chronic kidney disease, stage 3 unspecified: Secondary | ICD-10-CM

## 2017-05-22 DIAGNOSIS — F329 Major depressive disorder, single episode, unspecified: Secondary | ICD-10-CM | POA: Diagnosis present

## 2017-05-22 DIAGNOSIS — R0602 Shortness of breath: Secondary | ICD-10-CM | POA: Diagnosis not present

## 2017-05-22 DIAGNOSIS — Z950 Presence of cardiac pacemaker: Secondary | ICD-10-CM

## 2017-05-22 DIAGNOSIS — I480 Paroxysmal atrial fibrillation: Secondary | ICD-10-CM | POA: Diagnosis present

## 2017-05-22 DIAGNOSIS — I5031 Acute diastolic (congestive) heart failure: Secondary | ICD-10-CM | POA: Diagnosis present

## 2017-05-22 DIAGNOSIS — E785 Hyperlipidemia, unspecified: Secondary | ICD-10-CM | POA: Diagnosis present

## 2017-05-22 DIAGNOSIS — Z9181 History of falling: Secondary | ICD-10-CM

## 2017-05-22 DIAGNOSIS — I130 Hypertensive heart and chronic kidney disease with heart failure and stage 1 through stage 4 chronic kidney disease, or unspecified chronic kidney disease: Secondary | ICD-10-CM | POA: Diagnosis not present

## 2017-05-22 DIAGNOSIS — F419 Anxiety disorder, unspecified: Secondary | ICD-10-CM | POA: Diagnosis present

## 2017-05-22 DIAGNOSIS — R531 Weakness: Secondary | ICD-10-CM | POA: Diagnosis present

## 2017-05-22 DIAGNOSIS — E1122 Type 2 diabetes mellitus with diabetic chronic kidney disease: Secondary | ICD-10-CM | POA: Diagnosis present

## 2017-05-22 DIAGNOSIS — Z9842 Cataract extraction status, left eye: Secondary | ICD-10-CM

## 2017-05-22 DIAGNOSIS — R05 Cough: Secondary | ICD-10-CM | POA: Diagnosis not present

## 2017-05-22 DIAGNOSIS — Z9981 Dependence on supplemental oxygen: Secondary | ICD-10-CM

## 2017-05-22 DIAGNOSIS — K219 Gastro-esophageal reflux disease without esophagitis: Secondary | ICD-10-CM | POA: Diagnosis present

## 2017-05-22 DIAGNOSIS — D519 Vitamin B12 deficiency anemia, unspecified: Secondary | ICD-10-CM | POA: Diagnosis present

## 2017-05-22 DIAGNOSIS — Z8349 Family history of other endocrine, nutritional and metabolic diseases: Secondary | ICD-10-CM

## 2017-05-22 DIAGNOSIS — R404 Transient alteration of awareness: Secondary | ICD-10-CM | POA: Diagnosis not present

## 2017-05-22 DIAGNOSIS — I447 Left bundle-branch block, unspecified: Secondary | ICD-10-CM | POA: Diagnosis present

## 2017-05-22 DIAGNOSIS — Z888 Allergy status to other drugs, medicaments and biological substances status: Secondary | ICD-10-CM

## 2017-05-22 DIAGNOSIS — R296 Repeated falls: Secondary | ICD-10-CM | POA: Diagnosis present

## 2017-05-22 DIAGNOSIS — J9601 Acute respiratory failure with hypoxia: Secondary | ICD-10-CM | POA: Diagnosis present

## 2017-05-22 DIAGNOSIS — Z87442 Personal history of urinary calculi: Secondary | ICD-10-CM

## 2017-05-22 DIAGNOSIS — Z885 Allergy status to narcotic agent status: Secondary | ICD-10-CM

## 2017-05-22 DIAGNOSIS — I638 Other cerebral infarction: Secondary | ICD-10-CM | POA: Diagnosis not present

## 2017-05-22 DIAGNOSIS — Z79899 Other long term (current) drug therapy: Secondary | ICD-10-CM

## 2017-05-22 DIAGNOSIS — Z8679 Personal history of other diseases of the circulatory system: Secondary | ICD-10-CM

## 2017-05-22 DIAGNOSIS — J810 Acute pulmonary edema: Secondary | ICD-10-CM

## 2017-05-22 DIAGNOSIS — Z8673 Personal history of transient ischemic attack (TIA), and cerebral infarction without residual deficits: Secondary | ICD-10-CM

## 2017-05-22 HISTORY — DX: Malignant (primary) neoplasm, unspecified: C80.1

## 2017-05-22 LAB — TROPONIN I: Troponin I: 0.03 ng/mL (ref <0.03)

## 2017-05-22 LAB — COMPREHENSIVE METABOLIC PANEL
ALT: 42 U/L (ref 17–63)
ANION GAP: 8 (ref 5–15)
AST: 54 U/L — ABNORMAL HIGH (ref 15–41)
Albumin: 2.5 g/dL — ABNORMAL LOW (ref 3.5–5.0)
Alkaline Phosphatase: 106 U/L (ref 38–126)
BUN: 19 mg/dL (ref 6–20)
CHLORIDE: 100 mmol/L — AB (ref 101–111)
CO2: 30 mmol/L (ref 22–32)
Calcium: 8.5 mg/dL — ABNORMAL LOW (ref 8.9–10.3)
Creatinine, Ser: 0.92 mg/dL (ref 0.61–1.24)
GFR calc non Af Amer: >60 mL/min (ref >60)
Glucose, Bld: 128 mg/dL — ABNORMAL HIGH (ref 65–99)
Potassium: 3.9 mmol/L (ref 3.5–5.1)
SODIUM: 138 mmol/L (ref 135–145)
Total Bilirubin: 0.7 mg/dL (ref 0.3–1.2)
Total Protein: 6.7 g/dL (ref 6.5–8.1)

## 2017-05-22 LAB — URINALYSIS, ROUTINE W REFLEX MICROSCOPIC
BILIRUBIN URINE: NEGATIVE
Bacteria, UA: NONE SEEN
Glucose, UA: NEGATIVE mg/dL
Ketones, ur: NEGATIVE mg/dL
Leukocytes, UA: NEGATIVE
NITRITE: NEGATIVE
Protein, ur: NEGATIVE mg/dL
SPECIFIC GRAVITY, URINE: 1.005 (ref 1.005–1.030)
pH: 6 (ref 5.0–8.0)

## 2017-05-22 LAB — TSH: TSH: 3.021 u[IU]/mL (ref 0.350–4.500)

## 2017-05-22 LAB — CBC WITH DIFFERENTIAL/PLATELET
Basophils Absolute: 0 10*3/uL (ref 0.0–0.1)
Basophils Relative: 0 %
Eosinophils Absolute: 0.1 10*3/uL (ref 0.0–0.7)
Eosinophils Relative: 1 %
HEMATOCRIT: 31.6 % — AB (ref 39.0–52.0)
HEMOGLOBIN: 9.9 g/dL — AB (ref 13.0–17.0)
LYMPHS ABS: 0.9 10*3/uL (ref 0.7–4.0)
LYMPHS PCT: 18 %
MCH: 27.8 pg (ref 26.0–34.0)
MCHC: 31.3 g/dL (ref 30.0–36.0)
MCV: 88.8 fL (ref 78.0–100.0)
MONOS PCT: 11 %
Monocytes Absolute: 0.5 10*3/uL (ref 0.1–1.0)
NEUTROS ABS: 3.5 10*3/uL (ref 1.7–7.7)
NEUTROS PCT: 70 %
Platelets: 152 10*3/uL (ref 150–400)
RBC: 3.56 MIL/uL — ABNORMAL LOW (ref 4.22–5.81)
RDW: 17.9 % — ABNORMAL HIGH (ref 11.5–15.5)
WBC: 5 10*3/uL (ref 4.0–10.5)

## 2017-05-22 LAB — GLUCOSE, CAPILLARY: GLUCOSE-CAPILLARY: 156 mg/dL — AB (ref 65–99)

## 2017-05-22 LAB — VITAMIN B12: Vitamin B-12: 1104 pg/mL — ABNORMAL HIGH (ref 180–914)

## 2017-05-22 LAB — I-STAT CG4 LACTIC ACID, ED: LACTIC ACID, VENOUS: 1.07 mmol/L (ref 0.5–1.9)

## 2017-05-22 LAB — BRAIN NATRIURETIC PEPTIDE: B NATRIURETIC PEPTIDE 5: 1188 pg/mL — AB (ref 0.0–100.0)

## 2017-05-22 MED ORDER — ONDANSETRON HCL 4 MG/2ML IJ SOLN: 4.0000 mg | Freq: Four times a day (QID) | INTRAMUSCULAR | Status: DC | PRN

## 2017-05-22 MED ORDER — ENOXAPARIN SODIUM 40 MG/0.4ML ~~LOC~~ SOLN
40.0000 mg | SUBCUTANEOUS | Status: DC
  Administered 2017-05-22 – 2017-05-24 (×3): 40 mg via SUBCUTANEOUS
  Filled 2017-05-22 (×3): qty 0.4

## 2017-05-22 MED ORDER — AMIODARONE HCL 200 MG PO TABS
200.0000 mg | ORAL_TABLET | ORAL | Status: DC
  Administered 2017-05-25: 200 mg via ORAL
  Filled 2017-05-22 (×3): qty 1

## 2017-05-22 MED ORDER — POTASSIUM CHLORIDE CRYS ER 20 MEQ PO TBCR
20.0000 meq | EXTENDED_RELEASE_TABLET | Freq: Every day | ORAL | Status: DC
  Administered 2017-05-23 – 2017-05-25 (×3): 20 meq via ORAL
  Filled 2017-05-22 (×3): qty 1

## 2017-05-22 MED ORDER — DULOXETINE HCL 60 MG PO CPEP
60.0000 mg | ORAL_CAPSULE | Freq: Every evening | ORAL | Status: DC
  Administered 2017-05-23 – 2017-05-24 (×2): 60 mg via ORAL
  Filled 2017-05-22 (×2): qty 1

## 2017-05-22 MED ORDER — METOPROLOL TARTRATE 50 MG PO TABS
50.0000 mg | ORAL_TABLET | Freq: Two times a day (BID) | ORAL | Status: DC
  Administered 2017-05-22 – 2017-05-25 (×6): 50 mg via ORAL
  Filled 2017-05-22 (×6): qty 1

## 2017-05-22 MED ORDER — FUROSEMIDE 10 MG/ML IJ SOLN
40.0000 mg | Freq: Two times a day (BID) | INTRAMUSCULAR | Status: AC
  Administered 2017-05-22 – 2017-05-24 (×4): 40 mg via INTRAVENOUS
  Filled 2017-05-22 (×5): qty 4

## 2017-05-22 MED ORDER — SODIUM CHLORIDE 0.9% FLUSH: 3.0000 mL | INTRAVENOUS | Status: DC | PRN

## 2017-05-22 MED ORDER — DIAZEPAM 5 MG PO TABS
5.0000 mg | ORAL_TABLET | Freq: Every day | ORAL | Status: DC
  Administered 2017-05-22 – 2017-05-24 (×3): 5 mg via ORAL
  Filled 2017-05-22 (×3): qty 1

## 2017-05-22 MED ORDER — SODIUM CHLORIDE 0.9 % IV SOLN: 250.0000 mL | INTRAVENOUS | Status: DC | PRN

## 2017-05-22 MED ORDER — FERROUS SULFATE 325 (65 FE) MG PO TABS
325.0000 mg | ORAL_TABLET | Freq: Every day | ORAL | Status: DC
  Administered 2017-05-23 – 2017-05-25 (×3): 325 mg via ORAL
  Filled 2017-05-22 (×3): qty 1

## 2017-05-22 MED ORDER — LISINOPRIL 5 MG PO TABS
2.5000 mg | ORAL_TABLET | Freq: Every day | ORAL | Status: DC
  Administered 2017-05-23 – 2017-05-25 (×3): 2.5 mg via ORAL
  Filled 2017-05-22 (×3): qty 1

## 2017-05-22 MED ORDER — CYANOCOBALAMIN 1000 MCG/ML IJ SOLN
1000.0000 ug | INTRAMUSCULAR | Status: DC
  Filled 2017-05-22: qty 1

## 2017-05-22 MED ORDER — LEVOTHYROXINE SODIUM 25 MCG PO TABS
25.0000 ug | ORAL_TABLET | Freq: Every day | ORAL | Status: DC
  Administered 2017-05-23 – 2017-05-25 (×3): 25 ug via ORAL
  Filled 2017-05-22 (×3): qty 1

## 2017-05-22 MED ORDER — FLUTICASONE PROPIONATE 50 MCG/ACT NA SUSP
1.0000 | Freq: Every day | NASAL | Status: DC
  Filled 2017-05-22: qty 16

## 2017-05-22 MED ORDER — SODIUM CHLORIDE 0.9% FLUSH
3.0000 mL | Freq: Two times a day (BID) | INTRAVENOUS | Status: DC
  Administered 2017-05-22 – 2017-05-24 (×5): 3 mL via INTRAVENOUS

## 2017-05-22 MED ORDER — FUROSEMIDE 10 MG/ML IJ SOLN
40.0000 mg | INTRAMUSCULAR | Status: AC
  Administered 2017-05-22: 40 mg via INTRAVENOUS
  Filled 2017-05-22: qty 4

## 2017-05-22 MED ORDER — ATORVASTATIN CALCIUM 40 MG PO TABS
40.0000 mg | ORAL_TABLET | Freq: Every day | ORAL | Status: DC
  Administered 2017-05-22 – 2017-05-24 (×3): 40 mg via ORAL
  Filled 2017-05-22 (×3): qty 1

## 2017-05-22 MED ORDER — ACETAMINOPHEN 325 MG PO TABS
650.0000 mg | ORAL_TABLET | ORAL | Status: DC | PRN
  Administered 2017-05-23 – 2017-05-24 (×2): 650 mg via ORAL
  Filled 2017-05-22 (×2): qty 2

## 2017-05-22 MED ORDER — PANTOPRAZOLE SODIUM 40 MG PO TBEC
40.0000 mg | DELAYED_RELEASE_TABLET | Freq: Every day | ORAL | Status: DC
  Administered 2017-05-23 – 2017-05-25 (×3): 40 mg via ORAL
  Filled 2017-05-22 (×4): qty 1

## 2017-05-22 NOTE — ED Triage Notes (Signed)
Per EMS they were called out due low O2 saturation of 80 %.  Pt denies SOB and pain.  Pt is now being treated for respiratory infection and currently on antibiotics.  Wife states that he normally gets up and ambulates with a walker and has not been able to do so.  Pt is currently in mid 40s on room air.  Pt has a pacemaker.

## 2017-05-22 NOTE — ED Notes (Addendum)
Report given to [facility], pt going to room 307

## 2017-05-22 NOTE — ED Notes (Signed)
CRITICAL VALUE ALERT  Critical Value:  Troponin 0.03  Date & Time Notied:  05/22/2017 1421   Provider Notified: Sabra Heck  Orders Received/Actions taken: None given at this time

## 2017-05-22 NOTE — H&P (Signed)
History and Physical  Richard [name] MHD:[id number] DOB: 03-Jun-1931 DOA: 05/22/2017   PCP: Prince Solian, MD   Patient coming from: Home  Chief Complaint: dyspnea  HPI:  Richard [name] is a 83 y.o. male with medical history of diabetes mellitus, stroke, complete heart block status post permanent pacemaker, hypertension, hyperlipidemia, defibrillation, obstructive sleep apnea presented with a one-day history of forcing shortness of breath. The patient is a poor historian secondary to his dementia. All of this history is obtained from the medical record and speaking with the patient's spouse at the bedside. Apparently, the patient has had increasing generalized weakness and shortness of breath with exertion for the better part of the last 2 weeks. However, on the morning of 05/22/2017, the patient's spouse had trouble getting him out of bed secondary to lethargy and generalized weakness. In addition, the patient complained of increasing shortness of breath. EMS was activated. Notably, the patient was admitted to the hospital from 01/31/2017 through 02/01/2017 for a fall resulting in a head trauma. The patient had a left temporal and right occipital hematoma. The patient received KCentra at that time to reverse his NOAC. Since that time, the patient has continued to have falling episodes with his most recent fall 3 weeks prior to this admission. The patient had a follow-up CT of the head 2 weeks after his discharged in July. On 03/17/2017, repeat CT of the head revealed decreasing size of his right occipital and left temporal hemorrhage with no new focal hemorrhage. The patient has followed up with his primary care provider and notified his cardiologist of stopping his NOAC. Decision was made to keep the patient off of his apixaban. The patient denies any fevers, chills, chest pain, nausea, vomiting, diarrhea, abdominal pain, dysuria, hematuria. The patient was afebrile and hemodynamically  stable in the emergency department. He had an oxygen saturation of 85% on room air. His oxygenation saturation improved to 96% on 2 L. BMP and CBC were unremarkable. The patient was given furosemide 40 mg IV 1. EKG shows sinus rhythm with LBBB. Chest x-ray showed pulmonary edema.  Assessment/Plan: Acute on chronic diastolic CHF -daily weights -accurate I/O's -fluid restrict -Echo -continue IV Lasix -05/06/2016 Echo EF 60-65%, no WMA  Atrial fibrillation -Presently in sinus rhythm -The patient is not a good candidate for anticoagulation at this point secondary to his frequent falls and recent head trauma resulting in bleeding -In addition, the patient has had a history of small bowel AVMs -Continue amiodarone and metoprolol tartrate -his cardiologist was trying to arrange for Watchman procedure, but pt has been too weak to go through eval -Continue amiodarone  Acute respiratory failure with hypoxia -Secondary to CHF -Stable on 2 L -And oxygen for saturation greater than 92%  CKD stage 3 -baseline creatinine 0.9-1.2 -monitor with diuresis  Essential hypertension -Continue metoprolol tartrate -Anticipate improvement with diuresis  Generalized weakness -UA and culture -B12 -TSH -PT eval  Hyperlipidemia -Continue statin  Diabetes mellitus type 2 -diet controlled  Depression/anxiety -Continue Zoloft at home dose of Valium  OSA -he is compliant with CPAP at thome      Past Medical History:  Diagnosis Date  . Anxiety   . Arthritis   . Atrial fibrillation ([place])    a. Dx 03/2013, notes report atrial fibrillation/atrial flutter, placed on amiodarone. NSR in subsequent OV's.  . B12 deficiency anemia   . Cancer ([place])   . Chest pain    a. H/o CTA negative for PE 2012,  normal cath 2005, normal nucs previously including 05/2012.  Marland Kitchen Complete heart block ([facility])    a. s/p Medtronic Adapta L model ADDRL 1 (serial number [id number]) pacemaker.  . Dementia   . GERD  (gastroesophageal reflux disease)   . Hiatal hernia   . Hyperlipidemia   . Hypertension   . Iron deficiency anemia   . LBBB (left bundle branch block)   . Microcytic anemia   . Nephrolithiasis   . On home oxygen therapy    a. 2L w/CPAP at night  . Orthostatic hypotension   . OSA on CPAP    setting = 4  . Pacemaker   . [place] spotted fever ~ 1945  . Sinus drainage   . Stroke ([place]) 05/2016  . Type II diabetes mellitus ([place])    Past Surgical History:  Procedure Laterality Date  . CARDIAC CATHETERIZATION     by Dr. Acie Fredrickson, January 24, 2004, that shows minimal coronary artery irregularities and normal left ventricular function  . CATARACT EXTRACTION W/ INTRAOCULAR LENS  IMPLANT, BILATERAL Bilateral   . COLONOSCOPY WITH PROPOFOL N/A 05/23/2016   Procedure: COLONOSCOPY WITH PROPOFOL;  Surgeon: Jerene Bears, MD;  Location: [facility];  Service: Gastroenterology;  Laterality: N/A;  . ENTEROSCOPY N/A 06/02/2016   Procedure: ENTEROSCOPY;  Surgeon: Gatha Mayer, MD;  Location: [facility] ENDOSCOPY;  Service: Endoscopy;  Laterality: N/A;  . ESOPHAGOGASTRODUODENOSCOPY N/A 05/22/2016   Procedure: ESOPHAGOGASTRODUODENOSCOPY (EGD);  Surgeon: Jerene Bears, MD;  Location[facility] ENDOSCOPY;  Service: Endoscopy;  Laterality: N/A;  . ESOPHAGOGASTRODUODENOSCOPY (EGD) WITH PROPOFOL N/A 06/21/2015   Procedure: ESOPHAGOGASTRODUODENOSCOPY (EGD) WITH PROPOFOL;  Surgeon: Gatha Mayer, MD;  Location: [facility] ENDOSCOPY;  Service: Endoscopy;  Laterality: N/A;  . GIVENS CAPSULE STUDY N/A 05/23/2016   Procedure: GIVENS CAPSULE STUDY;  Surgeon: Jerene Bears, MD;  Location: [facility];  Service: Gastroenterology;  Laterality: N/A;  . HOT HEMOSTASIS N/A 06/02/2016   Procedure: HOT HEMOSTASIS (ARGON PLASMA COAGULATION/BICAP);  Surgeon: Gatha Mayer, MD;  Location: [facility];  Service: Endoscopy;  Laterality: N/A;  . INGUINAL HERNIA REPAIR Right   . IR RADIOLOGIST EVAL & MGMT  11/25/2016  . IR RADIOLOGIST EVAL & MGMT   01/22/2017  . IR VERTEBROPLASTY CERV/THOR BX INC UNI/BIL INC/INJECT/IMAGING  01/30/2017  . IR VERTEBROPLASTY EA ADDL (T&LS) BX INC UNI/BIL INC INJECT/IMAGING  11/28/2016  . IR VERTEBROPLASTY EA ADDL (T&LS) BX INC UNI/BIL INC INJECT/IMAGING  01/30/2017  . IR VERTEBROPLASTY LUMBAR BX INC UNI/BIL INC/INJECT/IMAGING  11/28/2016  . LUMBAR DISC SURGERY  ~ 1993  . PERMANENT PACEMAKER INSERTION N/A 06/03/2014   MDT Adapta L implanted by Dr Rayann Heman for syncope and transient AV block  . SKIN CANCER EXCISION     "lower lip" (04/06/2013)  . TEMPORARY PACEMAKER INSERTION N/A 05/31/2014   Procedure: TEMPORARY WIRE;  Surgeon: Sinclair Grooms, MD;  Location[facility] CATH LAB;  Service: Cardiovascular;  Laterality: N/A;  . VASECTOMY     Hx of    Social History:  reports that he has quit smoking. His smoking use included Cigarettes. He smoked 0.00 packs per day for 20.00 years. He has never used smokeless tobacco. He reports that he does not drink alcohol or use drugs.   Family History  Problem Relation Age of Onset  . Thyroid disease Mother        goiter  . Pneumonia Father   . Other Unknown        Parents both died of old age; other  conditions not known  . Stroke Brother   . Breast cancer Sister      Allergies  Allergen Reactions  . Codeine Other (See Comments)    Hallucinations   . Citalopram Swelling and Rash     Prior to Admission medications   Medication Sig Start Date End Date Taking? Authorizing Provider  acetaminophen (TYLENOL) 500 MG tablet Take 500 mg by mouth 2 (two) times daily.    Yes [provider]  amiodarone (PACERONE) 200 MG tablet Take 200 mg by mouth every Monday, Wednesday, and Friday.    Yes [provider]  atorvastatin (LIPITOR) 40 MG tablet Take 1 tablet (40 mg total) by mouth daily at 6 PM. 05/16/16  Yes Love, Ivan Anchors, PA-C  cyanocobalamin (,VITAMIN B-12,) 1000 MCG/ML injection Inject 1,000 mcg into the muscle every 30 (thirty) days.   Yes [provider]  diazepam (VALIUM) 5 MG tablet Take 5 mg by mouth at bedtime.    Yes [provider]  divalproex (DEPAKOTE) 250 MG DR tablet Take 1 tablet by mouth 2 (two) times daily. 05/13/17  Yes [provider]  DULoxetine (CYMBALTA) 60 MG capsule Take 60 mg by mouth every evening.   Yes [provider]  ferrous sulfate 325 (65 FE) MG tablet Take 325 mg by mouth daily with breakfast.   Yes [provider]  furosemide (LASIX) 40 MG tablet Take one tablet daily on Monday and Thursday. Patient taking differently: Take 20 mg every other day. 01/29/17  Yes Nahser, Wonda Cheng, MD  levothyroxine (SYNTHROID, LEVOTHROID) 25 MCG tablet Take 25 mcg by mouth daily before breakfast.   Yes [provider]  metoprolol (LOPRESSOR) 50 MG tablet TAKE 1 TABLET BY MOUTH TWICE A DAY Patient taking differently: Take 50 mg by mouth twice daily 07/07/16  Yes Nahser, Wonda Cheng, MD  pantoprazole (PROTONIX) 40 MG tablet Take 1 tablet (40 mg total) by mouth daily at 6 (six) AM. 05/25/16  Yes Kelvin Cellar, MD  potassium chloride (K-DUR) 10 MEQ tablet Take one tablet by mouth daily on Monday and Thursday. Patient taking differently: Take one tablet by mouth every other day. 01/29/17  Yes Nahser, Wonda Cheng, MD  fluticasone (FLONASE) 50 MCG/ACT nasal spray Place 1 spray into the nose daily as needed for allergies or rhinitis.     [provider]  MENTHOL, TOPICAL ANALGESIC, EX Apply 1 application topically daily as needed (muscle pain).    [provider]    Review of Systems:  Unobtainable secondary to patient's dementia  Physical Exam: Vitals:   05/22/17 1330 05/22/17 1400 05/22/17 1430 05/22/17 1519  BP: (!) 171/104 (!) 178/96 (!) 173/138 (!) 181/96  Pulse: 82 82 87 91  Resp: (!) 27 (!) 24 19 (!) 27  Temp:      TempSrc:      SpO2: 97% 96% 97% 96%  Weight:      Height:       General:  A&O x 1, NAD, nontoxic, pleasant/cooperative Head/Eye: No conjunctival hemorrhage,  no icterus, [place]/AT, No nystagmus ENT:  No icterus,  No thrush, good dentition, no pharyngeal exudate Neck:  No masses, no lymphadenpathy, no bruits CV:  RRR, no rub, no gallop, no S3 Lung:  Bibasilar crackles. No wheezing. Good air movement. Abdomen: soft/NT, +BS, nondistended, no peritoneal signs Ext: No cyanosis, No rashes, No petechiae, No lymphangitis, 2 +# edema Neuro: CNII-XII intact, strength 4/5 in bilateral upper and lower extremities, no dysmetria  Labs on Admission:  Basic Metabolic Panel:  Recent Labs Lab 05/22/17 1130  NA 138  K 3.9  CL 100*  CO2 30  GLUCOSE 128*  BUN 19  CREATININE 0.92  CALCIUM 8.5*   Liver Function Tests:  Recent Labs Lab 05/22/17 1130  AST 54*  ALT 42  ALKPHOS 106  BILITOT 0.7  PROT 6.7  ALBUMIN 2.5*   No results for input(s): LIPASE, AMYLASE in the last 168 hours. No results for input(s): AMMONIA in the last 168 hours. CBC:  Recent Labs Lab 05/22/17 1130  WBC 5.0  NEUTROABS 3.5  HGB 9.9*  HCT 31.6*  MCV 88.8  PLT 152   Coagulation Profile: No results for input(s): INR, PROTIME in the last 168 hours. Cardiac Enzymes:  Recent Labs Lab 05/22/17 1130  TROPONINI 0.03*   BNP: Invalid input(s): POCBNP CBG: No results for input(s): GLUCAP in the last 168 hours. Urine analysis:    Component Value Date/Time   COLORURINE COLORLESS (A) 05/22/2017 1130   APPEARANCEUR CLEAR 05/22/2017 1130   LABSPEC 1.005 05/22/2017 1130   PHURINE 6.0 05/22/2017 1130   GLUCOSEU NEGATIVE 05/22/2017 1130   HGBUR SMALL (A) 05/22/2017 1130   BILIRUBINUR NEGATIVE 05/22/2017 1130   KETONESUR NEGATIVE 05/22/2017 1130   PROTEINUR NEGATIVE 05/22/2017 1130   UROBILINOGEN 0.2 03/30/2014 1400   NITRITE NEGATIVE 05/22/2017 1130   LEUKOCYTESUR NEGATIVE 05/22/2017 1130   Sepsis Labs: @LABRCNTIP (procalcitonin:4,lacticidven:4) ) Recent Results (from the past 240 hour(s))  Blood Culture (routine x 2)     Status: None (Preliminary result)    Collection Time: 05/22/17 11:30 AM  Result Value Ref Range Status   Specimen Description BLOOD RIGHT FOREARM  Final   Special Requests   Final    BOTTLES DRAWN AEROBIC AND ANAEROBIC Blood Culture adequate volume   Culture PENDING  Incomplete   Report Status PENDING  Incomplete     Radiological Exams on Admission: Dg Chest Port 1 View  Result Date: 05/22/2017 CLINICAL DATA:  Cough.  Shortness of breath . EXAM: PORTABLE CHEST 1 VIEW COMPARISON:  12/11/2016. FINDINGS: Cardiac pacer with lead tips in right atrium and right ventricle. Cardiomegaly with pulmonary vascular prominence and bilateral interstitial prominence. Small bilateral pleural effusions. Findings consistent with congestive heart failure. IMPRESSION: Congestive heart failure with bilateral pulmonary interstitial edema small bilateral pleural effusions. Electronically Signed   By: Marcello Moores  Register   On: 05/22/2017 11:57    EKG: Independently reviewed. Sinus LBBB    Time spent:60 minutes Code Status:   FULL Family Communication:  Spouse update at bedside Disposition Plan: expect 2-3 day hospitalization Consults called: none DVT Prophylaxis: [place] Lovenox  Corneshia Hines, DO  [facility] Pager [phone]  If 7PM-7AM, please contact night-coverage [email or web address] Password [facility] 05/22/2017, 5:34 PM

## 2017-05-22 NOTE — ED Provider Notes (Signed)
[facility] DEPT Provider Note   CSN: [id number] Arrival date & time: 05/22/17  1110     History   Chief Complaint Chief Complaint  Patient presents with  . Shortness of Breath    HPI Richard [name] is a 83 y.o. male.  HPI  The patient is a chronically ill 83 year old male with a known history of atrial fibrillation, history of complete heart block status post pacemaker placement, history of high blood pressure hyperlipidemia and dementia. He presents with his wife today with a complaint of increased work of breathing and hypoxia - the pt has dementia and has had some progressive difficulty with ambulation, progressive difficulty with oxygenation and has been told that he may need oxygen around-the-clock but at this time only uses it at night with his CPAP machine. He had some difficulty getting out of bed this morning, he was unable to get out and walk, he was placed back in the bed and when his oxygen was noted to be 84% paramedics were called. According to the spouse the patient has had some difficulty walking this week, it is been a gradual decline over the last couple of weeks. There has been no fevers coughing or diarrhea. She states that sometimes she will look up from what she is doing and he is walking around the house with his walker stating that he wants to go home. She drives him around the block a couple of times a day come back to the house and he stops asking to go home. He has not been able to be redirectable recently.  Past Medical History:  Diagnosis Date  . Anxiety   . Arthritis   . Atrial fibrillation ([place])    a. Dx 03/2013, notes report atrial fibrillation/atrial flutter, placed on amiodarone. NSR in subsequent OV's.  . B12 deficiency anemia   . Chest pain    a. H/o CTA negative for PE 2012, normal cath 2005, normal nucs previously including 05/2012.  Marland Kitchen Complete heart block (HCC)    a. s/p Medtronic Adapta L model ADDRL 1 (serial number [id number]) pacemaker.   . Dementia   . GERD (gastroesophageal reflux disease)   . Hiatal hernia   . Hyperlipidemia   . Hypertension   . Iron deficiency anemia   . LBBB (left bundle branch block)   . Microcytic anemia   . Nephrolithiasis   . On home oxygen therapy    a. 2L w/CPAP at night  . Orthostatic hypotension   . OSA on CPAP    setting = 4  . Pacemaker   . [place] spotted fever ~ 1945  . Sinus drainage   . Skin cancer of lip   . Stroke ([place]) 05/2016  . Type II diabetes mellitus [facility])     Patient Active Problem List   Diagnosis Date Noted  . Intracranial hemorrhage ([place]) 03/03/2017  . Head trauma 03/02/2017  . Chronic diastolic CHF (congestive heart failure) ([place]) 11/16/2016  . AVM (arteriovenous malformation) of small bowel, acquired ([place])   . Abnormality of gait as late effect of stroke 05/27/2016  . Benign neoplasm of cecum   . Benign neoplasm of sigmoid colon   . GI bleeding 05/22/2016  . Melena   . GIB (gastrointestinal bleeding) 05/21/2016  . Diabetes mellitus with complication ([place])   . Upper GI bleed   . CKD (chronic kidney disease)   . Type 2 diabetes mellitus with circulatory disorder, without long-term current use of insulin ([place])   . Acute  right PCA stroke ([place]) 05/08/2016  . Ataxia due to recent stroke   . Gait disturbance, post-stroke   . Cerebellar stroke, acute ([place]) 05/07/2016  . Hyperlipidemia   . Chronic anticoagulation   . Cardiac pacemaker in situ   . Acute blood loss anemia   . Cognitive deficits   . Cognitive deficit due to recent stroke   . Stroke-related cognitive dysfunction   . Generalized weakness 05/05/2016  . Dizziness   . Absolute anemia 02/08/2016  . Weakness 02/01/2016  . Gastric polyp 06/21/2015  . Iron deficiency anemia due to chronic blood loss   . GERD (gastroesophageal reflux disease)   . B12 deficiency anemia   . Bradycardia with less than 30 beats per minute 05/31/2014  . Complete heart block ([place]) 05/31/2014  . Syncope  05/31/2014  . Anemia 05/31/2014  . Atrial fibrillation ([place]) 04/08/2013  . Diabetes mellitus ([place]) 04/06/2013  . Hiatal hernia 04/06/2013  . Essential hypertension 06/13/2011  . OSA (obstructive sleep apnea) 06/13/2011    Past Surgical History:  Procedure Laterality Date  . CARDIAC CATHETERIZATION     by Dr. Acie Fredrickson, January 24, 2004, that shows minimal coronary artery irregularities and normal left ventricular function  . CATARACT EXTRACTION W/ INTRAOCULAR LENS  IMPLANT, BILATERAL Bilateral   . COLONOSCOPY WITH PROPOFOL N/A 05/23/2016   Procedure: COLONOSCOPY WITH PROPOFOL;  Surgeon: Jerene Bears, MD;  Location: [facility];  Service: Gastroenterology;  Laterality: N/A;  . ENTEROSCOPY N/A 06/02/2016   Procedure: ENTEROSCOPY;  Surgeon: Gatha Mayer, MD;  Location: [facility] ENDOSCOPY;  Service: Endoscopy;  Laterality: N/A;  . ESOPHAGOGASTRODUODENOSCOPY N/A 05/22/2016   Procedure: ESOPHAGOGASTRODUODENOSCOPY (EGD);  Surgeon: Jerene Bears, MD;  Location: [facility] ENDOSCOPY;  Service: Endoscopy;  Laterality: N/A;  . ESOPHAGOGASTRODUODENOSCOPY (EGD) WITH PROPOFOL N/A 06/21/2015   Procedure: ESOPHAGOGASTRODUODENOSCOPY (EGD) WITH PROPOFOL;  Surgeon: Gatha Mayer, MD;  Location: [facility] ENDOSCOPY;  Service: Endoscopy;  Laterality: N/A;  . GIVENS CAPSULE STUDY N/A 05/23/2016   Procedure: GIVENS CAPSULE STUDY;  Surgeon: Jerene Bears, MD;  Location: [facility];  Service: Gastroenterology;  Laterality: N/A;  . HOT HEMOSTASIS N/A 06/02/2016   Procedure: HOT HEMOSTASIS (ARGON PLASMA COAGULATION/BICAP);  Surgeon: Gatha Mayer, MD;  Location: [facility] ENDOSCOPY;  Service: Endoscopy;  Laterality: N/A;  . INGUINAL HERNIA REPAIR Right   . IR RADIOLOGIST EVAL & MGMT  11/25/2016  . IR RADIOLOGIST EVAL & MGMT  01/22/2017  . IR VERTEBROPLASTY CERV/THOR BX INC UNI/BIL INC/INJECT/IMAGING  01/30/2017  . IR VERTEBROPLASTY EA ADDL (T&LS) BX INC UNI/BIL INC INJECT/IMAGING  11/28/2016  . IR VERTEBROPLASTY EA ADDL (T&LS) BX INC UNI/BIL INC  INJECT/IMAGING  01/30/2017  . IR VERTEBROPLASTY LUMBAR BX INC UNI/BIL INC/INJECT/IMAGING  11/28/2016  . LUMBAR DISC SURGERY  ~ 1993  . PERMANENT PACEMAKER INSERTION N/A 06/03/2014   MDT Adapta L implanted by Dr Rayann Heman for syncope and transient AV block  . SKIN CANCER EXCISION     "lower lip" (04/06/2013)  . TEMPORARY PACEMAKER INSERTION N/A 05/31/2014   Procedure: TEMPORARY WIRE;  Surgeon: Sinclair Grooms, MD;  Location: [facility] CATH LAB;  Service: Cardiovascular;  Laterality: N/A;  . VASECTOMY     Hx of     OB History    No data available       Home Medications    Prior to Admission medications   Medication Sig Start Date End Date Taking? Authorizing Provider  acetaminophen (TYLENOL) 500 MG tablet Take 500 mg by mouth 2 (two) times daily.  [provider]  amiodarone (PACERONE) 200 MG tablet Take 200 mg by mouth every Monday, Wednesday, and Friday.     [provider]  atorvastatin (LIPITOR) 40 MG tablet Take 1 tablet (40 mg total) by mouth daily at 6 PM. 05/16/16   Love, Ivan Anchors, PA-C  cyanocobalamin (,VITAMIN B-12,) 1000 MCG/ML injection Inject 1,000 mcg into the muscle every 30 (thirty) days.    [provider]  diazepam (VALIUM) 5 MG tablet Take 5 mg by mouth at bedtime.     [provider]  DULoxetine (CYMBALTA) 60 MG capsule Take 60 mg by mouth every evening.    [provider]  ferrous sulfate 325 (65 FE) MG tablet Take 325 mg by mouth daily with breakfast.    [provider]  fluticasone (FLONASE) 50 MCG/ACT nasal spray Place 1 spray into the nose daily as needed for allergies or rhinitis.     [provider]  furosemide (LASIX) 40 MG tablet Take one tablet daily on Monday and Thursday. Patient not taking: Reported on 04/12/2017 01/29/17   Nahser, Wonda Cheng, MD  levothyroxine (SYNTHROID, LEVOTHROID) 25 MCG tablet Take 25 mcg by mouth daily before breakfast.    [provider]  MENTHOL, TOPICAL ANALGESIC, EX Apply  1 application topically daily as needed (muscle pain).    [provider]  metoprolol (LOPRESSOR) 50 MG tablet TAKE 1 TABLET BY MOUTH TWICE A DAY Patient taking differently: Take 50 mg by mouth twice daily 07/07/16   Nahser, Wonda Cheng, MD  pantoprazole (PROTONIX) 40 MG tablet Take 1 tablet (40 mg total) by mouth daily at 6 (six) AM. 05/25/16   Kelvin Cellar, MD  potassium chloride (K-DUR) 10 MEQ tablet Take one tablet by mouth daily on Monday and Thursday. Patient not taking: Reported on 04/12/2017 01/29/17   Nahser, Wonda Cheng, MD  sertraline (ZOLOFT) 50 MG tablet Take 50 mg by mouth daily.  08/21/14   [provider]    Family History Family History  Problem Relation Age of Onset  . Thyroid disease Mother        goiter  . Pneumonia Father   . Other Unknown        Parents both died of old age; other conditions not known  . Stroke Brother   . Breast cancer Sister     Social History Social History  Substance Use Topics  . Smoking status: Former Smoker    Packs/day: 0.00    Years: 20.00    Types: Cigarettes  . Smokeless tobacco: Never Used     Comment: 04/06/2013 "quit smoking years ago; didn't smoke that much; probably 20 years; 1ppd"  . Alcohol use No     Allergies   Codeine and Citalopram   Review of Systems Review of Systems  Unable to perform ROS: Dementia     Physical Exam Updated Vital Signs BP (!) 163/86 (BP Location: Left Arm)   Pulse 87   Temp 97.8 F (36.6 C) (Oral)   Resp (!) 26   Ht 6\' 1"  (1.854 m)   Wt 97.5 kg (215 lb)   SpO2 94%   BMI 28.37 kg/m   Physical Exam  Constitutional: He appears well-developed and well-nourished. He appears distressed ( mild).  HENT:  Head: Normocephalic and atraumatic.  Mouth/Throat: Oropharynx is clear and moist. No oropharyngeal exudate.  Eyes: Pupils are equal, round, and reactive to light. Conjunctivae and EOM are normal. Right eye exhibits no discharge. Left eye exhibits no discharge. No scleral  icterus.  Neck: Normal range of motion. Neck supple. No JVD present. No thyromegaly present.  Cardiovascular: Normal rate, regular rhythm, normal heart sounds and intact distal pulses.  Exam reveals no gallop and no friction rub.   No murmur heard. Pulmonary/Chest: Effort normal. No respiratory distress. He has no wheezes. He has rales ( at the bases bilaterally).  Abdominal: Soft. Bowel sounds are normal. He exhibits no distension and no mass. There is no tenderness.  Musculoskeletal: Normal range of motion. He exhibits edema ( mild bilateral foot edema). He exhibits no tenderness.  Cant sit up in bed due to pain in back and headache and stifneess  Lymphadenopathy:    He has no cervical adenopathy.  Neurological: He is alert. Coordination normal.  There is mild tachypnea - sats of 84% on ra.   Speech significantly slurred - follows basic commands  Skin: Skin is warm and dry. No rash noted. No erythema.  No rasehes  Psychiatric: He has a normal mood and affect. His behavior is normal.  Nursing note and vitals reviewed.   ED Treatments / Results  Labs (all labs ordered are listed, but only abnormal results are displayed) Labs Reviewed  CULTURE, BLOOD (ROUTINE X 2)  CULTURE, BLOOD (ROUTINE X 2)  COMPREHENSIVE METABOLIC PANEL  CBC WITH DIFFERENTIAL/PLATELET  URINALYSIS, ROUTINE W REFLEX MICROSCOPIC  BRAIN NATRIURETIC PEPTIDE  I-STAT CG4 LACTIC ACID, ED    EKG  EKG Interpretation  Date/Time:  Friday May 22 2017 11:15:38 EDT Ventricular Rate:  87 PR Interval:    QRS Duration: 179 QT Interval:  451 QTC Calculation: 543 R Axis:   81 Text Interpretation:  Sinus or ectopic atrial rhythm Short PR interval IVCD, consider atypical LBBB Since last tracing rate faster Confirmed by Noemi Chapel [phone]) on 05/22/2017 11:50:16 AM       Radiology No results found.  Procedures Procedures (including critical care time)  Medications Ordered in ED Medications - No data to  display   Initial Impression / Assessment and Plan / ED Course  I have reviewed the triage vital signs and the nursing notes.  Pertinent labs & imaging results that were available during my care of the patient were reviewed by me and considered in my medical decision making (see chart for details).    The patient does appear to have some mild increased work of breathing, sats are 84-90% depending on respiratory effort, he does have rales, chest x-ray pending to evaluate for pneumonia, sepsis workup to ensue, mental status seems baseline. Spouse states that he had a prior stroke about one year ago but because of pacemaker cannot have MRI.  The patient has pulmonary edema on the x-ray, he is hypertensive to 170/100 and has a BNP over 1000. Compared to his prior echocardiograms this appears to be new.  Will admit the patient for congestive heart year, discussed with Dr. Carles Collet who is in agreement.  Final Clinical Impressions(s) / ED Diagnoses   Final diagnoses:  Acute pulmonary edema ([place])    New Prescriptions New Prescriptions   No medications on file     Noemi Chapel, MD 05/22/17 1344

## 2017-05-23 ENCOUNTER — Inpatient Hospital Stay (HOSPITAL_COMMUNITY): Payer: Medicare Other

## 2017-05-23 LAB — BASIC METABOLIC PANEL
Anion gap: 10 (ref 5–15)
BUN: 18 mg/dL (ref 6–20)
CALCIUM: 8.7 mg/dL — AB (ref 8.9–10.3)
CO2: 34 mmol/L — ABNORMAL HIGH (ref 22–32)
Chloride: 97 mmol/L — ABNORMAL LOW (ref 101–111)
Creatinine, Ser: 0.93 mg/dL (ref 0.61–1.24)
GFR calc Af Amer: >60 mL/min (ref >60)
GLUCOSE: 125 mg/dL — AB (ref 65–99)
Potassium: 3.7 mmol/L (ref 3.5–5.1)
SODIUM: 141 mmol/L (ref 135–145)

## 2017-05-23 LAB — ECHOCARDIOGRAM COMPLETE
Height: 73 in
Weight: 3358.05 oz

## 2017-05-23 NOTE — Progress Notes (Signed)
*  PRELIMINARY RESULTS* Echocardiogram 2D Echocardiogram has been performed.  Richard [name] 05/23/2017, 1:46 PM

## 2017-05-23 NOTE — Progress Notes (Signed)
PROGRESS NOTE  Richard [name] ZTI:[id number] DOB: Jul 02, 1931 DOA: 05/22/2017 PCP: Prince Solian, MD  Brief History:  83 y.o. male with medical history of diabetes mellitus, stroke, complete heart block status post permanent pacemaker, hypertension, hyperlipidemia, defibrillation, obstructive sleep apnea presented with a one-day history of forcing shortness of breath. The patient is a poor historian secondary to his dementia. All of this history is obtained from the medical record and speaking with the patient's spouse at the bedside. Apparently, the patient has had increasing generalized weakness and shortness of breath with exertion for the better part of the last 2 weeks. However, on the morning of 05/22/2017, the patient's spouse had trouble getting him out of bed secondary to lethargy and generalized weakness. In addition, the patient complained of increasing shortness of breath. EMS was activated. Notably, the patient was admitted to the hospital from 01/31/2017 through 02/01/2017 for a fall resulting in a head trauma. The patient had a left temporal and right occipital hematoma. The patient received KCentra at that time to reverse his NOAC. Since that time, the patient has continued to have falling episodes with his most recent fall 3 weeks prior to this admission. The patient had a follow-up CT of the head 2 weeks after his discharged in July. On 03/17/2017, repeat CT of the head revealed decreasing size of his right occipital and left temporal hemorrhage with no new focal hemorrhage. The patient has followed up with his primary care provider and notified his cardiologist of stopping his NOAC. Decision was made to keep the patient off of his apixaban.  Assessment/Plan: Acute on chronic diastolic CHF -daily weights -accurate I/O's -fluid restrict -Echo--pending -continue IV Lasix 40 IV bid -NEG 6 lbs -I/Os not accurate -05/06/2016 Echo EF 60-65%, no WMA  Atrial  fibrillation -Presently in sinus rhythm -The patient is not a good candidate for anticoagulation at this point secondary to his frequent falls and recent head trauma resulting in bleeding -In addition, the patient has had a history of small bowel AVMs -Continue amiodarone and metoprolol tartrate -his cardiologist was trying to arrange for Watchman procedure, but pt has been too weak to go through eval -Continue amiodarone  Acute respiratory failure with hypoxia -Secondary to CHF -Stable on 2 L-->96% -And oxygen for saturation greater than 92%  CKD stage 3 -baseline creatinine 0.9-1.2 -monitor with diuresis  Essential hypertension -Continue metoprolol tartrate -Anticipate improvement with diuresis  Generalized weakness -UA--no pyuria -B12--1104 -TSH--2.021 -PT eval  Hyperlipidemia -Continue statin  Diabetes mellitus type 2 -diet controlled  Depression/anxiety -Continue Zoloft at home dose of Valium  OSA -he is compliant with CPAP at thome   Disposition Plan:   Home 9/30 or 10/1 Family Communication:   Spouse updated at bedside 9/29  Consultants:  none  Code Status:  FULL   DVT Prophylaxis:   [place] Lovenox   Procedures: As Listed in Progress Note Above  Antibiotics: None    Subjective: Patient denies fevers, chills, headache, chest pain, dyspnea, nausea, vomiting, diarrhea, abdominal pain, dysuria, hematuria, hematochezia, and melena.   Objective: Vitals:   05/22/17 2120 05/22/17 2124 05/23/17 0537 05/23/17 0911  BP:   (!) 153/85   Pulse: 90 91 74   Resp: 20 20 18    Temp:   (!) 97.5 F (36.4 C)   TempSrc:   Axillary   SpO2: 95% 96% 96% 93%  Weight:   95.2 kg (209 lb 14.1 oz)   Height:  Intake/Output Summary (Last 24 hours) at 05/23/17 1246 Last data filed at 05/23/17 1227  Gross per 24 hour  Intake              480 ml  Output                0 ml  Net              480 ml   Weight change:  Exam:   General:  Pt is alert,  follows commands appropriately, not in acute distress  HEENT: No icterus, No thrush, No neck mass, [place]/AT  Cardiovascular: RRR, S1/S2, no rubs, no gallops  Respiratory: bibasilar crackles, no wheeze  Abdomen: Soft/+BS, non tender, non distended, no guarding  Extremities: 1+ LE edema, No lymphangitis, No petechiae, No rashes, no synovitis   Data Reviewed: I have personally reviewed following labs and imaging studies Basic Metabolic Panel:  Recent Labs Lab 05/22/17 1130 05/23/17 0825  NA 138 141  K 3.9 3.7  CL 100* 97*  CO2 30 34*  GLUCOSE 128* 125*  BUN 19 18  CREATININE 0.92 0.93  CALCIUM 8.5* 8.7*   Liver Function Tests:  Recent Labs Lab 05/22/17 1130  AST 54*  ALT 42  ALKPHOS 106  BILITOT 0.7  PROT 6.7  ALBUMIN 2.5*   No results for input(s): LIPASE, AMYLASE in the last 168 hours. No results for input(s): AMMONIA in the last 168 hours. Coagulation Profile: No results for input(s): INR, PROTIME in the last 168 hours. CBC:  Recent Labs Lab 05/22/17 1130  WBC 5.0  NEUTROABS 3.5  HGB 9.9*  HCT 31.6*  MCV 88.8  PLT 152   Cardiac Enzymes:  Recent Labs Lab 05/22/17 1130  TROPONINI 0.03*   BNP: Invalid input(s): POCBNP CBG:  Recent Labs Lab 05/22/17 1957  GLUCAP 156*   HbA1C: No results for input(s): HGBA1C in the last 72 hours. Urine analysis:    Component Value Date/Time   COLORURINE COLORLESS (A) 05/22/2017 1130   APPEARANCEUR CLEAR 05/22/2017 1130   LABSPEC 1.005 05/22/2017 1130   PHURINE 6.0 05/22/2017 1130   GLUCOSEU NEGATIVE 05/22/2017 1130   HGBUR SMALL (A) 05/22/2017 1130   BILIRUBINUR NEGATIVE 05/22/2017 1130   KETONESUR NEGATIVE 05/22/2017 1130   PROTEINUR NEGATIVE 05/22/2017 1130   UROBILINOGEN 0.2 03/30/2014 1400   NITRITE NEGATIVE 05/22/2017 1130   LEUKOCYTESUR NEGATIVE 05/22/2017 1130   Sepsis Labs: @LABRCNTIP (procalcitonin:4,lacticidven:4) ) Recent Results (from the past 240 hour(s))  Blood Culture (routine x 2)      Status: None (Preliminary result)   Collection Time: 05/22/17 11:30 AM  Result Value Ref Range Status   Specimen Description BLOOD RIGHT FOREARM  Final   Special Requests   Final    BOTTLES DRAWN AEROBIC AND ANAEROBIC Blood Culture adequate volume   Culture NO GROWTH < 24 HOURS  Final   Report Status PENDING  Incomplete  Blood Culture (routine x 2)     Status: None (Preliminary result)   Collection Time: 05/22/17  5:48 PM  Result Value Ref Range Status   Specimen Description BLOOD LEFT HAND  Final   Special Requests   Final    BOTTLES DRAWN AEROBIC ONLY Blood Culture results may not be optimal due to an inadequate volume of blood received in culture bottles   Culture NO GROWTH < 12 HOURS  Final   Report Status PENDING  Incomplete     Scheduled Meds: . amiodarone  200 mg Oral Q M,W,F  . atorvastatin  40  mg Oral q1800  . cyanocobalamin  1,000 mcg Intramuscular Q30 days  . diazepam  5 mg Oral QHS  . DULoxetine  60 mg Oral QPM  . enoxaparin (LOVENOX) injection  40 mg Subcutaneous Q24H  . ferrous sulfate  325 mg Oral Q breakfast  . fluticasone  1 spray Each Nare Daily  . furosemide  40 mg Intravenous BID  . levothyroxine  25 mcg Oral QAC breakfast  . lisinopril  2.5 mg Oral Daily  . metoprolol tartrate  50 mg Oral BID  . pantoprazole  40 mg Oral Q0600  . potassium chloride  20 mEq Oral Daily  . sodium chloride flush  3 mL Intravenous Q12H   Continuous Infusions: . sodium chloride      Procedures/Studies: Dg Chest Port 1 View  Result Date: 05/22/2017 CLINICAL DATA:  Cough.  Shortness of breath . EXAM: PORTABLE CHEST 1 VIEW COMPARISON:  12/11/2016. FINDINGS: Cardiac pacer with lead tips in right atrium and right ventricle. Cardiomegaly with pulmonary vascular prominence and bilateral interstitial prominence. Small bilateral pleural effusions. Findings consistent with congestive heart failure. IMPRESSION: Congestive heart failure with bilateral pulmonary interstitial edema small  bilateral pleural effusions. Electronically Signed   By: Marcello Moores  Register   On: 05/22/2017 11:57    Reginald Mangels, DO  [facility] Pager [phone]  If 7PM-7AM, please contact night-coverage [email or web address] Password [email or web address] 05/23/2017, 12:46 PM   LOS: 1 day

## 2017-05-24 LAB — BASIC METABOLIC PANEL
ANION GAP: 10 (ref 5–15)
BUN: 26 mg/dL — AB (ref 6–20)
CHLORIDE: 97 mmol/L — AB (ref 101–111)
CO2: 30 mmol/L (ref 22–32)
CREATININE: 1.27 mg/dL — AB (ref 0.61–1.24)
Calcium: 8.4 mg/dL — ABNORMAL LOW (ref 8.9–10.3)
GFR calc Af Amer: 58 mL/min — ABNORMAL LOW (ref >60)
GFR, EST NON AFRICAN AMERICAN: 50 mL/min — AB (ref >60)
GLUCOSE: 152 mg/dL — AB (ref 65–99)
POTASSIUM: 3.5 mmol/L (ref 3.5–5.1)
Sodium: 137 mmol/L (ref 135–145)

## 2017-05-24 LAB — URINE CULTURE: Culture: <10000 — AB

## 2017-05-24 LAB — MAGNESIUM: Magnesium: 1.5 mg/dL — ABNORMAL LOW (ref 1.7–2.4)

## 2017-05-24 MED ORDER — FUROSEMIDE 40 MG PO TABS: 40.0000 mg | ORAL_TABLET | Freq: Every day | ORAL | Status: DC

## 2017-05-24 MED ORDER — MAGNESIUM SULFATE 2 GM/50ML IV SOLN
2.0000 g | Freq: Once | INTRAVENOUS | Status: AC
  Administered 2017-05-24: 2 g via INTRAVENOUS
  Filled 2017-05-24: qty 50

## 2017-05-24 MED ORDER — FUROSEMIDE 40 MG PO TABS
40.0000 mg | ORAL_TABLET | Freq: Every day | ORAL | Status: DC
  Administered 2017-05-25: 40 mg via ORAL
  Filled 2017-05-24: qty 1

## 2017-05-24 MED ORDER — LISINOPRIL 2.5 MG PO TABS: 2.5000 mg | ORAL_TABLET | Freq: Every day | ORAL | 0 refills | Status: DC

## 2017-05-24 NOTE — Progress Notes (Signed)
PROGRESS NOTE  Richard [name] PJA:[id number] DOB: [birth year] DOA: 05/22/2017 PCP: Prince Solian, MD  Brief History:  83 y.o.malewith medical history of diabetes mellitus, stroke, complete heart block status post permanent pacemaker, hypertension, hyperlipidemia, defibrillation, obstructive sleep apnea presented with a one-day history of forcing shortness of breath. The patient is a poor historian secondary to his dementia. All of this history is obtained from the medical record and speaking with the patient's spouse at the bedside. Apparently, the patient has had increasing generalized weakness and shortness of breath with exertion for the better part of the last 2 weeks. However, on the morning of 05/22/2017, the patient's spouse had trouble getting him out of bed secondary to lethargy and generalized weakness. In addition, the patient complained of increasing shortness of breath. EMS was activated. Notably, the patient was admitted to the hospital from 01/31/2017 through 02/01/2017 for a fall resulting in a head trauma. The patient had a left temporal and right occipital hematoma. The patient received KCentra at that time to reverse his NOAC.Since that time, the patient has continued to have falling episodes with his most recent fall 3 weeks prior to this admission. The patient had a follow-up CT of the head 2 weeks after his discharged in July. On 03/17/2017, repeat CT of the head revealed decreasing size of his right occipital and left temporal hemorrhage with no new focal hemorrhage. The patient has followed up with his primary care provider and notified his cardiologist of stopping his NOAC. Decision was made to keep the patient off of his apixaban.  Assessment/Plan: Acute on chronic diastolic CHF -daily weights -accurate I/O's -fluid restrict -Echo--pending -continue IV Lasix 40 IV bid -NEG 6 lbs -I/Os not accurate -05/06/2016 Echo EF 60-65%, no WMA  Atrial  fibrillation -Presently in sinus rhythm -The patient is not a good candidate for anticoagulation at this point secondary to his frequent falls and recent head trauma resulting in bleeding -In addition, the patient has had a history of small bowel AVMs -Continue amiodarone and metoprolol tartrate -his cardiologist was trying to arrange for Watchman procedure, but pt has been too weak to go through eval -Continue amiodarone  Acute respiratory failure with hypoxia -Secondary to CHF -Stable on 2 L>>>weaned to RA -And oxygen for saturation greater than 92%  CKD stage 3 -baseline creatinine 0.9-1.2 -monitor with diuresis  Essential hypertension -Continue metoprolol tartrate -improvement with diuresis  Generalized weakness -UA--no pyuria -B12--1104 -TSH--2.021 -PT eval  Hyperlipidemia -Continue statin  Diabetes mellitus type 2 -diet controlled  Depression/anxiety -Continue Zoloft at home dose of Valium  OSA -he is compliant with CPAP at [facility]   Disposition Plan:   Home 10/01 or 10/02 Family Communication:   Spouse updated at bedside 9/30  Consultants:  none  Code Status:  FULL   DVT Prophylaxis:   Benson Lovenox   Procedures: As Listed in Progress Note Above  Antibiotics: None    Subjective: Patient denies fevers, chills, headache, chest pain, dyspnea, nausea, vomiting, diarrhea, abdominal pain, dysuria, hematuria, hematochezia, and melena.   Objective: Vitals:   05/23/17 2014 05/23/17 2041 05/24/17 0533 05/24/17 1300  BP: (!) 154/84  (!) 152/76 136/73  Pulse: 77 78 73 85  Resp: 18 20 20 19   Temp: 97.7 F (36.5 C)  97.6 F (36.4 C) 98.4 F (36.9 C)  TempSrc: Oral  Oral Oral  SpO2: 95% 98% 97% 93%  Weight:   96 kg (211 lb 10.3 oz)  Height:        Intake/Output Summary (Last 24 hours) at 05/24/17 1544 Last data filed at 05/24/17 1300  Gross per 24 hour  Intake              840 ml  Output              400 ml  Net              440  ml   Weight change: -1.523 kg (-3 lb 5.7 oz) Exam:   General:  Pt is alert, follows commands appropriately, not in acute distress  HEENT: No icterus, No thrush, No neck mass, [place]/AT  Cardiovascular: IRRR, S1/S2, no rubs, no gallops  Respiratory: bibasilar crackles, no wheeze  Abdomen: Soft/+BS, non tender, non distended, no guarding  Extremities: 1 +LE  edema, No lymphangitis, No petechiae, No rashes, no synovitis   Data Reviewed: I have personally reviewed following labs and imaging studies Basic Metabolic Panel:  Recent Labs Lab 05/22/17 1130 05/23/17 0825 05/24/17 0537  NA 138 141 137  K 3.9 3.7 3.5  CL 100* 97* 97*  CO2 30 34* 30  GLUCOSE 128* 125* 152*  BUN 19 18 26*  CREATININE 0.92 0.93 1.27*  CALCIUM 8.5* 8.7* 8.4*  MG  --   --  1.5*   Liver Function Tests:  Recent Labs Lab 05/22/17 1130  AST 54*  ALT 42  ALKPHOS 106  BILITOT 0.7  PROT 6.7  ALBUMIN 2.5*   No results for input(s): LIPASE, AMYLASE in the last 168 hours. No results for input(s): AMMONIA in the last 168 hours. Coagulation Profile: No results for input(s): INR, PROTIME in the last 168 hours. CBC:  Recent Labs Lab 05/22/17 1130  WBC 5.0  NEUTROABS 3.5  HGB 9.9*  HCT 31.6*  MCV 88.8  PLT 152   Cardiac Enzymes:  Recent Labs Lab 05/22/17 1130  TROPONINI 0.03*   BNP: Invalid input(s): POCBNP CBG:  Recent Labs Lab 05/22/17 1957  GLUCAP 156*   HbA1C: No results for input(s): HGBA1C in the last 72 hours. Urine analysis:    Component Value Date/Time   COLORURINE COLORLESS (A) 05/22/2017 1130   APPEARANCEUR CLEAR 05/22/2017 1130   LABSPEC 1.005 05/22/2017 1130   PHURINE 6.0 05/22/2017 1130   GLUCOSEU NEGATIVE 05/22/2017 1130   HGBUR SMALL (A) 05/22/2017 1130   BILIRUBINUR NEGATIVE 05/22/2017 1130   KETONESUR NEGATIVE 05/22/2017 1130   PROTEINUR NEGATIVE 05/22/2017 1130   UROBILINOGEN 0.2 03/30/2014 1400   NITRITE NEGATIVE 05/22/2017 1130   LEUKOCYTESUR NEGATIVE  05/22/2017 1130   Sepsis Labs: @LABRCNTIP (procalcitonin:4,lacticidven:4) ) Recent Results (from the past 240 hour(s))  Blood Culture (routine x 2)     Status: None (Preliminary result)   Collection Time: 05/22/17 11:30 AM  Result Value Ref Range Status   Specimen Description BLOOD RIGHT FOREARM  Final   Special Requests   Final    BOTTLES DRAWN AEROBIC AND ANAEROBIC Blood Culture adequate volume   Culture NO GROWTH 2 DAYS  Final   Report Status PENDING  Incomplete  Culture, Urine     Status: Abnormal   Collection Time: 05/22/17 11:30 AM  Result Value Ref Range Status   Specimen Description URINE, CLEAN CATCH  Final   Special Requests NONE  Final   Culture (A)  Final    <10,000 COLONIES/mL INSIGNIFICANT GROWTH Performed at [facility], [place] [address], [place], [place] [address]    Report Status 05/24/2017 FINAL  Final  Blood  Culture (routine x 2)     Status: None (Preliminary result)   Collection Time: 05/22/17  5:48 PM  Result Value Ref Range Status   Specimen Description BLOOD LEFT HAND  Final   Special Requests   Final    BOTTLES DRAWN AEROBIC ONLY Blood Culture results may not be optimal due to an inadequate volume of blood received in culture bottles   Culture NO GROWTH 2 DAYS  Final   Report Status PENDING  Incomplete     Scheduled Meds: . amiodarone  200 mg Oral Q M,W,F  . atorvastatin  40 mg Oral q1800  . cyanocobalamin  1,000 mcg Intramuscular Q30 days  . diazepam  5 mg Oral QHS  . DULoxetine  60 mg Oral QPM  . enoxaparin (LOVENOX) injection  40 mg Subcutaneous Q24H  . ferrous sulfate  325 mg Oral Q breakfast  . fluticasone  1 spray Each Nare Daily  . furosemide  40 mg Intravenous BID  . levothyroxine  25 mcg Oral QAC breakfast  . lisinopril  2.5 mg Oral Daily  . metoprolol tartrate  50 mg Oral BID  . pantoprazole  40 mg Oral Q0600  . potassium chloride  20 mEq Oral Daily  . sodium chloride flush  3 mL Intravenous Q12H   Continuous Infusions: .  sodium chloride      Procedures/Studies: Dg Chest Port 1 View  Result Date: 05/22/2017 CLINICAL DATA:  Cough.  Shortness of breath . EXAM: PORTABLE CHEST 1 VIEW COMPARISON:  12/11/2016. FINDINGS: Cardiac pacer with lead tips in right atrium and right ventricle. Cardiomegaly with pulmonary vascular prominence and bilateral interstitial prominence. Small bilateral pleural effusions. Findings consistent with congestive heart failure. IMPRESSION: Congestive heart failure with bilateral pulmonary interstitial edema small bilateral pleural effusions. Electronically Signed   By: Marcello Moores  Register   On: 05/22/2017 11:57    Amada Hallisey, DO  [facility] Pager [phone]  If 7PM-7AM, please contact night-coverage [email or web address] Password [email or web address] 05/24/2017, 3:44 PM   LOS: 2 days

## 2017-05-24 NOTE — Discharge Summary (Signed)
Physician Discharge Summary  Richard [name] NWG:[id number] DOB: June 10, 1931 DOA: 05/22/2017  PCP: Prince Solian, MD  Admit date: 05/22/2017 Discharge date: 05/25/17  Admitted From: Home Disposition:  SNF  Recommendations for Outpatient Follow-up:  1. Follow up with PCP in 1-2 weeks 2. Please obtain BMP in one week    Discharge Condition: Stable CODE STATUS:FULL Diet recommendation: Heart Healthy   Brief/Interim Summary: 83 y.o.malewith medical history of diabetes mellitus, stroke, complete heart block status post permanent pacemaker, hypertension, hyperlipidemia, defibrillation, obstructive sleep apnea presented with a one-day history of forcing shortness of breath. The patient is a poor historian secondary to his dementia. All of this history is obtained from the medical record and speaking with the patient's spouse at the bedside. Apparently, the patient has had increasing generalized weakness and shortness of breath with exertion for the better part of the last 2 weeks. However, on the morning of 05/22/2017, the patient's spouse had trouble getting him out of bed secondary to lethargy and generalized weakness. In addition, the patient complained of increasing shortness of breath. EMS was activated. Notably, the patient was admitted to the hospital from 01/31/2017 through 02/01/2017 for a fall resulting in a head trauma. The patient had a left temporal and right occipital hematoma. The patient received KCentra at that time to reverse his NOAC.Since that time, the patient has continued to have falling episodes with his most recent fall 3 weeks prior to this admission. The patient had a follow-up CT of the head 2 weeks after his discharged in July. On 03/17/2017, repeat CT of the head revealed decreasing size of his right occipital and left temporal hemorrhage with no new focal hemorrhage. The patient has followed up with his primary care provider and notified his cardiologist of stopping  his NOAC. Decision was made to keep the patient off of his apixaban.  Discharge Diagnoses:   Acute on chronic diastolic CHF -daily weights -fluid restrict -Echo--pending -continue IV Lasix 40 IV bid-->discharge with lasix 40 mg po daily -NEG 6 lbs--discharge weight 212 -I/Os not accurate -05/23/2017 Echo EF 50-55%, grade 1 DD, no WMA  Paroxysmal Atrial fibrillation -Presently in sinus rhythm -The patient is not a good candidate for anticoagulation at this point secondary to his frequent falls and recent head trauma resulting in bleeding -In addition, the patient has had a history of small bowel AVMs -Continue amiodarone and metoprolol tartrate -his cardiologist was trying to arrange for Watchman procedure, but pt has been too weak to go through eval -Continue amiodarone -start ASA -03/17/2017, repeat CT of the head revealed decreasing size of his right occipital and left temporal hemorrhage with no new focal hemorrhage. T  Acute respiratory failure with hypoxia -Secondary to CHF -Stable on 2 L>>>weaned to RA--95-96% -And oxygen for saturation greater than 92% -continue CPAP at night time  CKD stage 3 -baseline creatinine 0.9-1.2 -monitor with diuresis -serum creatinine 1.36 on day of d/c  Essential hypertension -Continue metoprolol tartrate and lisinopril -improvement with diuresis  Generalized weakness -UA--no pyuria -B12--1104 -TSH--2.021 -PT eval  Hyperlipidemia -Continue statin  Diabetes mellitus type 2 -diet controlled  Depression/anxiety -Continue Zoloft at home dose of Valium  OSA -he is compliant with CPAP at [facility]  Discharge Instructions  Discharge Instructions    Diet - low sodium heart healthy    Complete by:  As directed    Increase activity slowly    Complete by:  As directed      Allergies as of 05/25/2017  Reactions   Codeine Other (See Comments)   Hallucinations   Citalopram Swelling, Rash      Medication List      TAKE these medications   acetaminophen 500 MG tablet Commonly known as:  TYLENOL Take 500 mg by mouth 2 (two) times daily.   amiodarone 200 MG tablet Commonly known as:  PACERONE Take 200 mg by mouth every Monday, Wednesday, and Friday.   aspirin EC 81 MG tablet Take 1 tablet (81 mg total) by mouth daily.   atorvastatin 40 MG tablet Commonly known as:  LIPITOR Take 1 tablet (40 mg total) by mouth daily at 6 PM.   cyanocobalamin 1000 MCG/ML injection Commonly known as:  (VITAMIN B-12) Inject 1,000 mcg into the muscle every 30 (thirty) days.   diazepam 5 MG tablet Commonly known as:  VALIUM Take 0.5 tablets (2.5 mg total) by mouth at bedtime. What changed:  how much to take   divalproex 250 MG DR tablet Commonly known as:  DEPAKOTE Take 1 tablet by mouth 2 (two) times daily.   DULoxetine 60 MG capsule Commonly known as:  CYMBALTA Take 60 mg by mouth every evening.   ferrous sulfate 325 (65 FE) MG tablet Take 325 mg by mouth daily with breakfast.   fluticasone 50 MCG/ACT nasal spray Commonly known as:  FLONASE Place 1 spray into the nose daily as needed for allergies or rhinitis.   furosemide 40 MG tablet Commonly known as:  LASIX Take 1 tablet (40 mg total) by mouth daily. What changed:  how much to take  how to take this  when to take this  additional instructions   levothyroxine 25 MCG tablet Commonly known as:  SYNTHROID, LEVOTHROID Take 25 mcg by mouth daily before breakfast.   lisinopril 2.5 MG tablet Commonly known as:  PRINIVIL,ZESTRIL Take 1 tablet (2.5 mg total) by mouth daily.   MENTHOL (TOPICAL ANALGESIC) EX Apply 1 application topically daily as needed (muscle pain).   metoprolol tartrate 50 MG tablet Commonly known as:  LOPRESSOR TAKE 1 TABLET BY MOUTH TWICE A DAY What changed:  See the new instructions.   pantoprazole 40 MG tablet Commonly known as:  PROTONIX Take 1 tablet (40 mg total) by mouth daily at 6 (six) AM.   potassium  chloride 10 MEQ tablet Commonly known as:  K-DUR Take 1 tablet (10 mEq total) by mouth daily. What changed:  how much to take  how to take this  when to take this  additional instructions            Discharge Care Instructions        Start     Ordered   05/26/17 0000  aspirin EC 81 MG tablet  Daily     05/25/17 0856   05/25/17 0000  lisinopril (PRINIVIL,ZESTRIL) 2.5 MG tablet  Daily     05/24/17 1732   05/25/17 0000  furosemide (LASIX) 40 MG tablet  Daily     05/25/17 0853   05/25/17 0000  potassium chloride (K-DUR) 10 MEQ tablet  Daily     05/25/17 0853   05/25/17 0000  Increase activity slowly     05/25/17 0853   05/25/17 0000  Diet - low sodium heart healthy     05/25/17 0853   05/25/17 0000  diazepam (VALIUM) 5 MG tablet  Daily at bedtime     05/25/17 0857     Follow-up Information    Avva, Ravisankar, MD Follow up in 1 week(s).   Specialty:  Internal Medicine Contact information: [place] [address] [phone]          Allergies  Allergen Reactions  . Codeine Other (See Comments)    Hallucinations   . Citalopram Swelling and Rash    Consultations:  none   Procedures/Studies: Dg Chest Port 1 View  Result Date: 05/22/2017 CLINICAL DATA:  Cough.  Shortness of breath . EXAM: PORTABLE CHEST 1 VIEW COMPARISON:  12/11/2016. FINDINGS: Cardiac pacer with lead tips in right atrium and right ventricle. Cardiomegaly with pulmonary vascular prominence and bilateral interstitial prominence. Small bilateral pleural effusions. Findings consistent with congestive heart failure. IMPRESSION: Congestive heart failure with bilateral pulmonary interstitial edema small bilateral pleural effusions. Electronically Signed   By: Marcello Moores  Register   On: 05/22/2017 11:57        Discharge Exam: Vitals:   05/25/17 0532 05/25/17 0757  BP: (!) 143/67 (!) 160/64  Pulse: 78 68  Resp: 18 20  Temp: 98.3 F (36.8 C) 97.6 F (36.4 C)  SpO2: 98% 96%    Vitals:   05/24/17 1951 05/24/17 2025 05/25/17 0532 05/25/17 0757  BP: 134/70  (!) 143/67 (!) 160/64  Pulse: 86 83 78 68  Resp: 16 20 18 20   Temp: 98.4 F (36.9 C)  98.3 F (36.8 C) 97.6 F (36.4 C)  TempSrc: Oral  Oral Oral  SpO2: 94% 94% 98% 96%  Weight:   96.2 kg (212 lb 1.3 oz)   Height:        General: Pt is alert, awake, not in acute distress Cardiovascular: RRR, S1/S2 +, no rubs, no gallops Respiratory: bibasilar crackles Abdominal: Soft, NT, ND, bowel sounds + Extremities: trace edema, no cyanosis   The results of significant diagnostics from this hospitalization (including imaging, microbiology, ancillary and laboratory) are listed below for reference.    Significant Diagnostic Studies: Dg Chest Port 1 View  Result Date: 05/22/2017 CLINICAL DATA:  Cough.  Shortness of breath . EXAM: PORTABLE CHEST 1 VIEW COMPARISON:  12/11/2016. FINDINGS: Cardiac pacer with lead tips in right atrium and right ventricle. Cardiomegaly with pulmonary vascular prominence and bilateral interstitial prominence. Small bilateral pleural effusions. Findings consistent with congestive heart failure. IMPRESSION: Congestive heart failure with bilateral pulmonary interstitial edema small bilateral pleural effusions. Electronically Signed   By: Marcello Moores  Register   On: 05/22/2017 11:57     Microbiology: Recent Results (from the past 240 hour(s))  Blood Culture (routine x 2)     Status: None (Preliminary result)   Collection Time: 05/22/17 11:30 AM  Result Value Ref Range Status   Specimen Description BLOOD RIGHT FOREARM  Final   Special Requests   Final    BOTTLES DRAWN AEROBIC AND ANAEROBIC Blood Culture adequate volume   Culture NO GROWTH 3 DAYS  Final   Report Status PENDING  Incomplete  Culture, Urine     Status: Abnormal   Collection Time: 05/22/17 11:30 AM  Result Value Ref Range Status   Specimen Description URINE, CLEAN CATCH  Final   Special Requests NONE  Final   Culture (A)   Final    <10,000 COLONIES/mL INSIGNIFICANT GROWTH Performed at [facility], [address], [place], [place] [address]    Report Status 05/24/2017 FINAL  Final  Blood Culture (routine x 2)     Status: None (Preliminary result)   Collection Time: 05/22/17  5:48 PM  Result Value Ref Range Status   Specimen Description BLOOD LEFT HAND  Final   Special Requests  Final    BOTTLES DRAWN AEROBIC ONLY Blood Culture results may not be optimal due to an inadequate volume of blood received in culture bottles   Culture NO GROWTH 3 DAYS  Final   Report Status PENDING  Incomplete     Labs: Basic Metabolic Panel:  Recent Labs Lab 05/22/17 1130 05/23/17 0825 05/24/17 0537 05/25/17 0625  NA 138 141 137 139  K 3.9 3.7 3.5 3.3*  CL 100* 97* 97* 101  CO2 30 34* 30 30  GLUCOSE 128* 125* 152* 129*  BUN 19 18 26* 33*  CREATININE 0.92 0.93 1.27* 1.36*  CALCIUM 8.5* 8.7* 8.4* 8.4*  MG  --   --  1.5* 1.8   Liver Function Tests:  Recent Labs Lab 05/22/17 1130  AST 54*  ALT 42  ALKPHOS 106  BILITOT 0.7  PROT 6.7  ALBUMIN 2.5*   No results for input(s): LIPASE, AMYLASE in the last 168 hours. No results for input(s): AMMONIA in the last 168 hours. CBC:  Recent Labs Lab 05/22/17 1130  WBC 5.0  NEUTROABS 3.5  HGB 9.9*  HCT 31.6*  MCV 88.8  PLT 152   Cardiac Enzymes:  Recent Labs Lab 05/22/17 1130  TROPONINI 0.03*   BNP: Invalid input(s): POCBNP CBG:  Recent Labs Lab 05/22/17 1957  GLUCAP 156*    Time coordinating discharge:  Greater than 30 minutes  Signed:  Aaryana Betke, DO [facility] Pager: [phone] 05/25/2017, 8:57 AM

## 2017-05-25 ENCOUNTER — Encounter (HOSPITAL_COMMUNITY): Payer: Self-pay

## 2017-05-25 LAB — BASIC METABOLIC PANEL
Anion gap: 8 (ref 5–15)
BUN: 33 mg/dL — AB (ref 6–20)
CALCIUM: 8.4 mg/dL — AB (ref 8.9–10.3)
CO2: 30 mmol/L (ref 22–32)
Chloride: 101 mmol/L (ref 101–111)
Creatinine, Ser: 1.36 mg/dL — ABNORMAL HIGH (ref 0.61–1.24)
GFR calc Af Amer: 53 mL/min — ABNORMAL LOW (ref >60)
GFR, EST NON AFRICAN AMERICAN: 46 mL/min — AB (ref >60)
Glucose, Bld: 129 mg/dL — ABNORMAL HIGH (ref 65–99)
POTASSIUM: 3.3 mmol/L — AB (ref 3.5–5.1)
SODIUM: 139 mmol/L (ref 135–145)

## 2017-05-25 LAB — MAGNESIUM: MAGNESIUM: 1.8 mg/dL (ref 1.7–2.4)

## 2017-05-25 MED ORDER — ASPIRIN EC 81 MG PO TBEC: 81.0000 mg | DELAYED_RELEASE_TABLET | Freq: Every day | ORAL | 1 refills | Status: AC

## 2017-05-25 MED ORDER — FUROSEMIDE 40 MG PO TABS: 40.0000 mg | ORAL_TABLET | Freq: Every day | ORAL | 1 refills | Status: DC

## 2017-05-25 MED ORDER — POTASSIUM CHLORIDE ER 10 MEQ PO TBCR: 10.0000 meq | EXTENDED_RELEASE_TABLET | Freq: Every day | ORAL | 1 refills | Status: DC

## 2017-05-25 MED ORDER — DIAZEPAM 5 MG PO TABS: 2.5000 mg | ORAL_TABLET | Freq: Every day | ORAL | 0 refills | Status: AC

## 2017-05-25 NOTE — Evaluation (Signed)
Physical Therapy Evaluation Patient Details Name: Richard [name] MRN: [id number] DOB: [birth year] Today's Date: 05/25/2017   History of Present Illness  Richard [name] is a 83 y.o. male with medical history of diabetes mellitus, stroke, complete heart block status post permanent pacemaker, hypertension, hyperlipidemia, defibrillation, obstructive sleep apnea presented with a one-day history of forcing shortness of breath. The patient is a poor historian secondary to his dementia. All of this history is obtained from the medical record and speaking with the patient's spouse at the bedside. Apparently, the patient has had increasing generalized weakness and shortness of breath with exertion for the better part of the last 2 weeks. However, on the morning of 05/22/2017, the patient's spouse had trouble getting him out of bed secondary to lethargy and generalized weakness. In addition, the patient complained of increasing shortness of breath. EMS was activated. Notably, the patient was admitted to the hospital from 01/31/2017 through 02/01/2017 for a fall resulting in a head trauma. The patient had a left temporal and right occipital hematoma. The patient received KCentra at that time to reverse his NOAC. Since that time, the patient has continued to have falling episodes with his most recent fall 3 weeks prior to this admission. The patient had a follow-up CT of the head 2 weeks after his discharged in July. On 03/17/2017, repeat CT of the head revealed decreasing size of his right occipital and left temporal hemorrhage with no new focal hemorrhage. The patient has followed up with his primary care provider and notified his cardiologist of stopping his NOAC.    Clinical Impression  Patient limited for functional mobility as stated below secondary to BLE weakness, fatigue and poor standing balance. Patient able to ambulate with slow labored cadence to bathroom for a bowel movement and tolerated sitting up in  chair afterwards, but to fatigued to attempt ambulation out of room.  Patient will benefit from continued physical therapy in hospital and recommended venue below to increase strength, balance, endurance for safe ADLs and gait.    Follow Up Recommendations SNF;Supervision/Assistance - 24 hour    Equipment Recommendations  None recommended by PT    Recommendations for Other Services       Precautions / Restrictions Precautions Precautions: Fall Restrictions Weight Bearing Restrictions: No      Mobility  Bed Mobility Overal bed mobility: Needs Assistance Bed Mobility: Supine to Sit;Sit to Supine     Supine to sit: Min guard Sit to supine: Min guard      Transfers Overall transfer level: Needs assistance Equipment used: Rolling walker (2 wheeled) Transfers: Sit to/from [facility] Sit to Stand: Min assist Stand pivot transfers: Min assist          Ambulation/Gait Ambulation/Gait assistance: Min assist Ambulation Distance (Feet): 12 Feet Assistive device: Rolling walker (2 wheeled) Gait Pattern/deviations: Decreased step length - right;Decreased step length - left;Decreased stride length   Gait velocity interpretation: Below normal speed for age/gender General Gait Details: slow labored unsteady cadence with difficulty making turns  Science writer    Modified Rankin (Stroke Patients Only)       Balance Overall balance assessment: Needs assistance Sitting-balance support: Feet supported Sitting balance-Leahy Scale: Fair     Standing balance support: Bilateral upper extremity supported;During functional activity Standing balance-Leahy Scale: Poor  Pertinent Vitals/Pain Pain Assessment: Faces Pain Score: 2  Pain Location: low back Pain Descriptors / Indicators: Discomfort Pain Intervention(s): Limited activity within patient's tolerance;Monitored during session     Home Living Family/patient expects to be discharged to:: Private residence Living Arrangements: Spouse/significant other Available Help at Discharge: Family Type of Home: House Home Access: Ramped entrance     Home Layout: One level Home Equipment: Environmental consultant - 2 wheels;Cane - single point;Wheelchair - power;Bedside commode;Hospital bed;Shower seat      Prior Function Level of Independence: Needs assistance   Gait / Transfers Assistance Needed: Ambulates with RW and spouse assisting  ADL's / Homemaking Assistance Needed: assisted by spouse        Hand Dominance   Dominant Hand: Right    Extremity/Trunk Assessment   Upper Extremity Assessment Upper Extremity Assessment: Generalized weakness    Lower Extremity Assessment Lower Extremity Assessment: Generalized weakness    Cervical / Trunk Assessment Cervical / Trunk Assessment: Kyphotic  Communication   Communication: HOH  Cognition Arousal/Alertness: Awake/alert Behavior During Therapy: WFL for tasks assessed/performed Overall Cognitive Status: Within Functional Limits for tasks assessed                                        General Comments      Exercises     Assessment/Plan    PT Assessment Patient needs continued PT services  PT Problem List Decreased strength;Decreased activity tolerance;Decreased balance;Decreased mobility       PT Treatment Interventions Gait training;Stair training;Functional mobility training;Therapeutic activities;Therapeutic exercise;Patient/family education    PT Goals (Current goals can be found in the Care Plan section)  Acute Rehab PT Goals Patient Stated Goal: Return home at [facility] after rehab PT Goal Formulation: With patient/family Time For Goal Achievement: 06/01/17 Potential to Achieve Goals: Good    Frequency Min 3X/week   Barriers to discharge        Co-evaluation               AM-PAC PT "6 Clicks" Daily Activity  Outcome Measure  Difficulty turning over in bed (including adjusting bedclothes, sheets and blankets)?: A Little Difficulty moving from lying on back to sitting on the side of the bed? : A Little Difficulty sitting down on and standing up from a chair with arms (e.g., wheelchair, bedside commode, etc,.)?: A Little Help needed moving to and from a bed to chair (including a wheelchair)?: A Little Help needed walking in hospital room?: A Lot Help needed climbing 3-5 steps with a railing? : A Lot 6 Click Score: 16    End of Session Equipment Utilized During Treatment: Gait belt Activity Tolerance: Patient limited by fatigue Patient left: in chair;with call bell/phone within reach;with family/visitor present Nurse Communication: Mobility status PT Visit Diagnosis: Unsteadiness on feet (R26.81);Other abnormalities of gait and mobility (R26.89);Muscle weakness (generalized) (M62.81)    Time: 4270-6237 PT Time Calculation (min) (ACUTE ONLY): 31 min   Charges:   PT Evaluation $PT Eval Low Complexity: 1 Low PT Treatments $Therapeutic Activity: 23-37 mins   PT G Codes:   PT G-Codes **NOT FOR INPATIENT CLASS** Functional Assessment Tool Used: AM-PAC 6 Clicks Basic Mobility Functional Limitation: Mobility: Walking and moving around Mobility: Walking and Moving Around Current Status (S2831): At least 40 percent but less than 60 percent impaired, limited or restricted Mobility: Walking and Moving Around Goal Status [phone]): At least 40 percent but less than  60 percent impaired, limited or restricted Mobility: Walking and Moving Around Discharge Status [phone]): At least 40 percent but less than 60 percent impaired, limited or restricted    1:11 PM, 05/25/17 Lonell Grandchild, MPT Physical Therapist with [facility] [phone] office [phone] mobile phone

## 2017-05-25 NOTE — NC FL2 (Signed)
[place] LEVEL OF CARE SCREENING TOOL     IDENTIFICATION  Patient Name: Richard [name] Birthdate: [birth year] Sex: male Admission Date (Current Location): 05/22/2017  [facility] and [place] Number:  [employer] and Address:  [place] [address], [place]      Provider Number: [id number]  Attending Physician Name and Address:  Orson Eva, MD  Relative Name and Phone Number:       Current Level of Care: Hospital Recommended Level of Care: [place] Prior Approval Number:    Date Approved/Denied:   PASRR Number:    Discharge Plan: Home    Current Diagnoses: Patient Active Problem List   Diagnosis Date Noted  . Acute on chronic diastolic CHF (congestive heart failure) ([place]) 05/22/2017  . CKD (chronic kidney disease) stage 3, GFR 30-59 ml/min (HCC) 05/22/2017  . Acute respiratory failure with hypoxia ([place]) 05/22/2017  . Intracranial hemorrhage ([place]) 03/03/2017  . Head trauma 03/02/2017  . Chronic diastolic CHF (congestive heart failure) ([place]) 11/16/2016  . AVM (arteriovenous malformation) of small bowel, acquired ([place])   . Abnormality of gait as late effect of stroke 05/27/2016  . Benign neoplasm of cecum   . Benign neoplasm of sigmoid colon   . GI bleeding 05/22/2016  . Melena   . GIB (gastrointestinal bleeding) 05/21/2016  . Diabetes mellitus with complication ([place])   . Upper GI bleed   . CKD (chronic kidney disease)   . Type 2 diabetes mellitus with circulatory disorder, without long-term current use of insulin ([place])   . Acute right PCA stroke ([place]) 05/08/2016  . Ataxia due to recent stroke   . Gait disturbance, post-stroke   . Cerebellar stroke, acute ([place]) 05/07/2016  . Hyperlipidemia   . Chronic anticoagulation   . Cardiac pacemaker in situ   . Acute blood loss anemia   . Cognitive deficits   . Cognitive deficit due to recent stroke   . Stroke-related cognitive dysfunction   . Generalized  weakness 05/05/2016  . Dizziness   . Absolute anemia 02/08/2016  . Weakness 02/01/2016  . Gastric polyp 06/21/2015  . Iron deficiency anemia due to chronic blood loss   . GERD (gastroesophageal reflux disease)   . B12 deficiency anemia   . Bradycardia with less than 30 beats per minute 05/31/2014  . Complete heart block ([place]) 05/31/2014  . Syncope 05/31/2014  . Anemia 05/31/2014  . Atrial fibrillation ([place]) 04/08/2013  . Diabetes mellitus ([place]) 04/06/2013  . Hiatal hernia 04/06/2013  . Essential hypertension 06/13/2011  . OSA (obstructive sleep apnea) 06/13/2011    Orientation RESPIRATION BLADDER Height & Weight     Self, Place  O2 (2L) Continent Weight: 212 lb 1.3 oz (96.2 kg) Height:  6\' 1"  (185.4 cm)  BEHAVIORAL SYMPTOMS/MOOD NEUROLOGICAL BOWEL NUTRITION STATUS      Continent Diet (low sodium/heart healthy)  AMBULATORY STATUS COMMUNICATION OF NEEDS Skin   Limited Assist Verbally Normal                       Personal Care Assistance Level of Assistance  Bathing, Feeding, Dressing Bathing Assistance: Limited assistance Feeding assistance: Independent Dressing Assistance: Limited assistance     Functional Limitations Info  Sight, Hearing, Speech Sight Info: Adequate Hearing Info: Adequate Speech Info: Adequate    SPECIAL CARE FACTORS FREQUENCY  PT (By licensed PT)     PT Frequency: 5x/week  Contractures Contractures Info: Not present    Additional Factors Info  Code Status, Allergies, Psychotropic Code Status Info: Full code Allergies Info: Codeine, Citalopram Psychotropic Info: Valium, Zoloft         Current Medications (05/25/2017):  This is the current hospital active medication list Current Facility-Administered Medications  Medication Dose Route Frequency Provider Last Rate Last Dose  . 0.9 %  sodium chloride infusion  250 mL Intravenous PRN Tat, David, MD      . acetaminophen (TYLENOL) tablet 650 mg  650 mg Oral Q4H PRN Orson Eva, MD   650 mg at 05/24/17 1124  . amiodarone (PACERONE) tablet 200 mg  200 mg Oral Q M,W,F Orson Eva, MD   200 mg at 05/25/17 0905  . atorvastatin (LIPITOR) tablet 40 mg  40 mg Oral q1800 Orson Eva, MD   40 mg at 05/24/17 1837  . cyanocobalamin ((VITAMIN B-12)) injection 1,000 mcg  1,000 mcg Intramuscular Q30 days Tat, David, MD      . diazepam (VALIUM) tablet 5 mg  5 mg Oral Benay Pike, MD   5 mg at 05/24/17 2014  . DULoxetine (CYMBALTA) DR capsule 60 mg  60 mg Oral QPM Orson Eva, MD   60 mg at 05/24/17 1837  . enoxaparin (LOVENOX) injection 40 mg  40 mg Subcutaneous Q24H Tat, Shanon Brow, MD   40 mg at 05/24/17 2014  . ferrous sulfate tablet 325 mg  325 mg Oral Q breakfast Tat, Shanon Brow, MD   325 mg at 05/25/17 0901  . fluticasone (FLONASE) 50 MCG/ACT nasal spray 1 spray  1 spray Each Nare Daily Tat, David, MD      . furosemide (LASIX) tablet 40 mg  40 mg Oral Daily Tat, David, MD   40 mg at 05/25/17 0905  . levothyroxine (SYNTHROID, LEVOTHROID) tablet 25 mcg  25 mcg Oral QAC breakfast Tat, Shanon Brow, MD   25 mcg at 05/25/17 0902  . lisinopril (PRINIVIL,ZESTRIL) tablet 2.5 mg  2.5 mg Oral Daily Tat, David, MD   2.5 mg at 05/25/17 0905  . metoprolol tartrate (LOPRESSOR) tablet 50 mg  50 mg Oral BID Orson Eva, MD   50 mg at 05/25/17 0905  . ondansetron (ZOFRAN) injection 4 mg  4 mg Intravenous Q6H PRN Tat, David, MD      . pantoprazole (PROTONIX) EC tablet 40 mg  40 mg Oral [id number] Orson Eva, MD   40 mg at 05/25/17 0902  . potassium chloride SA (K-DUR,KLOR-CON) CR tablet 20 mEq  20 mEq Oral Daily Tat, David, MD   20 mEq at 05/25/17 0906  . sodium chloride flush (NS) 0.9 % injection 3 mL  3 mL Intravenous Therisa Doyne, MD   3 mL at 05/24/17 2014  . sodium chloride flush (NS) 0.9 % injection 3 mL  3 mL Intravenous PRN Tat, Shanon Brow, MD       Facility-Administered Medications Ordered in Other Encounters  Medication Dose Route Frequency Provider Last Rate Last Dose  . 0.9 %  sodium chloride infusion    Intravenous Continuous Monia Sabal, PA-C         Discharge Medications: Please see discharge summary for a list of discharge medications.  Relevant Imaging Results:  Relevant Lab Results:   Additional Information SSN [id number], [place], [place]

## 2017-05-25 NOTE — Clinical Social Work Note (Signed)
Patient will be going home with HHPT at discharge. Spouse only wanted [facility] and [facility] was unable to make a bed offer.  At baseline, patient's wife provides assistance with all ADL's including ambulation with a walker. Patient is able to feed himself and shave himself with minimal assistance. Patient's wife assist patient with dressing, ambulation, bathing, and toileting.   LCSW signing off.     Payton Moder, [place]dene Pugh, LCSW

## 2017-05-25 NOTE — Care Management Note (Signed)
Case Management Note  Patient Details  Name: Richard [name] MRN: [id number] Date of Birth: 04/22/1931     Expected Discharge Date:  05/25/17               Expected Discharge Plan:  [place]  In-House Referral:  Clinical Social Work  Discharge planning Services  CM Consult  Post Acute Care Choice:    Choice offered to:     DME Arranged:    DME Agency:     HH Arranged:    [place] Agency:     Status of Service:  Completed, signed off  If discussed at [employer] of [employer], dates discussed:    Additional Comments: CM consulted for Heart failure. Per chart patient discharging today, recommended for SNF. CSW consulted to workup SNF referral. CM will sign off.  Mykah Shin, Chauncey Reading, RN 05/25/2017, 11:31 AM

## 2017-05-25 NOTE — Care Management Note (Signed)
Case Management Note  Patient Details  Name: SAIR FAULCON MRN: [id number] Date of Birth: 1930/10/12    Expected Discharge Date:  05/25/17               Expected Discharge Plan:  Skilled Nursing Facility  In-House Referral:  Clinical Social Work  Discharge planning Services  CM Consult  Post Acute Care Choice:    Choice offered to:     DME Arranged:    DME Agency:     HH Arranged:   PT, aide [place] Agency:   [place]  Status of Service:  Completed, signed off  If discussed at [employer] of Stay Meetings, dates discussed:    Additional Comments: Patient's wife has elected to take patient home with home health PT and aide services. Would like [facility]. Brad of [facility] notified and will obtain orders from chart. Wife aware [facility] has 48 hours to initiate services. Wife requests EMS transport home.  Lameisha Schuenemann, Chauncey Reading, RN 05/25/2017, 2:37 PM

## 2017-05-26 DIAGNOSIS — Z7951 Long term (current) use of inhaled steroids: Secondary | ICD-10-CM | POA: Diagnosis not present

## 2017-05-26 DIAGNOSIS — Z87891 Personal history of nicotine dependence: Secondary | ICD-10-CM | POA: Diagnosis not present

## 2017-05-26 DIAGNOSIS — E1122 Type 2 diabetes mellitus with diabetic chronic kidney disease: Secondary | ICD-10-CM | POA: Diagnosis not present

## 2017-05-26 DIAGNOSIS — G4733 Obstructive sleep apnea (adult) (pediatric): Secondary | ICD-10-CM | POA: Diagnosis not present

## 2017-05-26 DIAGNOSIS — Z7982 Long term (current) use of aspirin: Secondary | ICD-10-CM | POA: Diagnosis not present

## 2017-05-26 DIAGNOSIS — S50902A Unspecified superficial injury of left elbow, initial encounter: Secondary | ICD-10-CM | POA: Diagnosis not present

## 2017-05-26 DIAGNOSIS — S40921A Unspecified superficial injury of right upper arm, initial encounter: Secondary | ICD-10-CM | POA: Diagnosis not present

## 2017-05-26 DIAGNOSIS — F0390 Unspecified dementia without behavioral disturbance: Secondary | ICD-10-CM | POA: Diagnosis not present

## 2017-05-26 DIAGNOSIS — I480 Paroxysmal atrial fibrillation: Secondary | ICD-10-CM | POA: Diagnosis not present

## 2017-05-26 DIAGNOSIS — L89312 Pressure ulcer of right buttock, stage 2: Secondary | ICD-10-CM | POA: Diagnosis not present

## 2017-05-26 DIAGNOSIS — I5033 Acute on chronic diastolic (congestive) heart failure: Secondary | ICD-10-CM | POA: Diagnosis not present

## 2017-05-26 DIAGNOSIS — Z950 Presence of cardiac pacemaker: Secondary | ICD-10-CM | POA: Diagnosis not present

## 2017-05-26 DIAGNOSIS — S50911A Unspecified superficial injury of right forearm, initial encounter: Secondary | ICD-10-CM | POA: Diagnosis not present

## 2017-05-26 DIAGNOSIS — I130 Hypertensive heart and chronic kidney disease with heart failure and stage 1 through stage 4 chronic kidney disease, or unspecified chronic kidney disease: Secondary | ICD-10-CM | POA: Diagnosis not present

## 2017-05-26 DIAGNOSIS — Z9981 Dependence on supplemental oxygen: Secondary | ICD-10-CM | POA: Diagnosis not present

## 2017-05-26 DIAGNOSIS — Z8673 Personal history of transient ischemic attack (TIA), and cerebral infarction without residual deficits: Secondary | ICD-10-CM | POA: Diagnosis not present

## 2017-05-26 DIAGNOSIS — N183 Chronic kidney disease, stage 3 (moderate): Secondary | ICD-10-CM | POA: Diagnosis not present

## 2017-05-26 DIAGNOSIS — F418 Other specified anxiety disorders: Secondary | ICD-10-CM | POA: Diagnosis not present

## 2017-05-26 DIAGNOSIS — Z9181 History of falling: Secondary | ICD-10-CM | POA: Diagnosis not present

## 2017-05-27 DIAGNOSIS — L89312 Pressure ulcer of right buttock, stage 2: Secondary | ICD-10-CM | POA: Diagnosis not present

## 2017-05-27 DIAGNOSIS — E1122 Type 2 diabetes mellitus with diabetic chronic kidney disease: Secondary | ICD-10-CM | POA: Diagnosis not present

## 2017-05-27 DIAGNOSIS — I480 Paroxysmal atrial fibrillation: Secondary | ICD-10-CM | POA: Diagnosis not present

## 2017-05-27 DIAGNOSIS — I130 Hypertensive heart and chronic kidney disease with heart failure and stage 1 through stage 4 chronic kidney disease, or unspecified chronic kidney disease: Secondary | ICD-10-CM | POA: Diagnosis not present

## 2017-05-27 DIAGNOSIS — N183 Chronic kidney disease, stage 3 (moderate): Secondary | ICD-10-CM | POA: Diagnosis not present

## 2017-05-27 DIAGNOSIS — I5033 Acute on chronic diastolic (congestive) heart failure: Secondary | ICD-10-CM | POA: Diagnosis not present

## 2017-05-27 LAB — CULTURE, BLOOD (ROUTINE X 2)
Culture: NO GROWTH
Culture: NO GROWTH
SPECIAL REQUESTS: ADEQUATE

## 2017-05-28 DIAGNOSIS — E1122 Type 2 diabetes mellitus with diabetic chronic kidney disease: Secondary | ICD-10-CM | POA: Diagnosis not present

## 2017-05-28 DIAGNOSIS — I480 Paroxysmal atrial fibrillation: Secondary | ICD-10-CM | POA: Diagnosis not present

## 2017-05-28 DIAGNOSIS — N183 Chronic kidney disease, stage 3 (moderate): Secondary | ICD-10-CM | POA: Diagnosis not present

## 2017-05-28 DIAGNOSIS — I130 Hypertensive heart and chronic kidney disease with heart failure and stage 1 through stage 4 chronic kidney disease, or unspecified chronic kidney disease: Secondary | ICD-10-CM | POA: Diagnosis not present

## 2017-05-28 DIAGNOSIS — I5033 Acute on chronic diastolic (congestive) heart failure: Secondary | ICD-10-CM | POA: Diagnosis not present

## 2017-05-28 DIAGNOSIS — L89312 Pressure ulcer of right buttock, stage 2: Secondary | ICD-10-CM | POA: Diagnosis not present

## 2017-05-29 DIAGNOSIS — E1122 Type 2 diabetes mellitus with diabetic chronic kidney disease: Secondary | ICD-10-CM | POA: Diagnosis not present

## 2017-05-29 DIAGNOSIS — I130 Hypertensive heart and chronic kidney disease with heart failure and stage 1 through stage 4 chronic kidney disease, or unspecified chronic kidney disease: Secondary | ICD-10-CM | POA: Diagnosis not present

## 2017-05-29 DIAGNOSIS — I5033 Acute on chronic diastolic (congestive) heart failure: Secondary | ICD-10-CM | POA: Diagnosis not present

## 2017-05-29 DIAGNOSIS — L89312 Pressure ulcer of right buttock, stage 2: Secondary | ICD-10-CM | POA: Diagnosis not present

## 2017-05-29 DIAGNOSIS — I480 Paroxysmal atrial fibrillation: Secondary | ICD-10-CM | POA: Diagnosis not present

## 2017-05-29 DIAGNOSIS — N183 Chronic kidney disease, stage 3 (moderate): Secondary | ICD-10-CM | POA: Diagnosis not present

## 2017-06-01 DIAGNOSIS — I5021 Acute systolic (congestive) heart failure: Secondary | ICD-10-CM | POA: Diagnosis not present

## 2017-06-01 DIAGNOSIS — L89312 Pressure ulcer of right buttock, stage 2: Secondary | ICD-10-CM | POA: Diagnosis not present

## 2017-06-01 DIAGNOSIS — Z0000 Encounter for general adult medical examination without abnormal findings: Secondary | ICD-10-CM | POA: Diagnosis not present

## 2017-06-01 DIAGNOSIS — N183 Chronic kidney disease, stage 3 (moderate): Secondary | ICD-10-CM | POA: Diagnosis not present

## 2017-06-01 DIAGNOSIS — I480 Paroxysmal atrial fibrillation: Secondary | ICD-10-CM | POA: Diagnosis not present

## 2017-06-01 DIAGNOSIS — E1122 Type 2 diabetes mellitus with diabetic chronic kidney disease: Secondary | ICD-10-CM | POA: Diagnosis not present

## 2017-06-01 DIAGNOSIS — I5033 Acute on chronic diastolic (congestive) heart failure: Secondary | ICD-10-CM | POA: Diagnosis not present

## 2017-06-01 DIAGNOSIS — I130 Hypertensive heart and chronic kidney disease with heart failure and stage 1 through stage 4 chronic kidney disease, or unspecified chronic kidney disease: Secondary | ICD-10-CM | POA: Diagnosis not present

## 2017-06-02 DIAGNOSIS — N183 Chronic kidney disease, stage 3 (moderate): Secondary | ICD-10-CM | POA: Diagnosis not present

## 2017-06-02 DIAGNOSIS — E1122 Type 2 diabetes mellitus with diabetic chronic kidney disease: Secondary | ICD-10-CM | POA: Diagnosis not present

## 2017-06-02 DIAGNOSIS — I130 Hypertensive heart and chronic kidney disease with heart failure and stage 1 through stage 4 chronic kidney disease, or unspecified chronic kidney disease: Secondary | ICD-10-CM | POA: Diagnosis not present

## 2017-06-02 DIAGNOSIS — I480 Paroxysmal atrial fibrillation: Secondary | ICD-10-CM | POA: Diagnosis not present

## 2017-06-02 DIAGNOSIS — L89312 Pressure ulcer of right buttock, stage 2: Secondary | ICD-10-CM | POA: Diagnosis not present

## 2017-06-02 DIAGNOSIS — I5033 Acute on chronic diastolic (congestive) heart failure: Secondary | ICD-10-CM | POA: Diagnosis not present

## 2017-06-03 DIAGNOSIS — N183 Chronic kidney disease, stage 3 (moderate): Secondary | ICD-10-CM | POA: Diagnosis not present

## 2017-06-03 DIAGNOSIS — E1122 Type 2 diabetes mellitus with diabetic chronic kidney disease: Secondary | ICD-10-CM | POA: Diagnosis not present

## 2017-06-03 DIAGNOSIS — L89312 Pressure ulcer of right buttock, stage 2: Secondary | ICD-10-CM | POA: Diagnosis not present

## 2017-06-03 DIAGNOSIS — I480 Paroxysmal atrial fibrillation: Secondary | ICD-10-CM | POA: Diagnosis not present

## 2017-06-03 DIAGNOSIS — I130 Hypertensive heart and chronic kidney disease with heart failure and stage 1 through stage 4 chronic kidney disease, or unspecified chronic kidney disease: Secondary | ICD-10-CM | POA: Diagnosis not present

## 2017-06-03 DIAGNOSIS — I5033 Acute on chronic diastolic (congestive) heart failure: Secondary | ICD-10-CM | POA: Diagnosis not present

## 2017-06-04 DIAGNOSIS — I130 Hypertensive heart and chronic kidney disease with heart failure and stage 1 through stage 4 chronic kidney disease, or unspecified chronic kidney disease: Secondary | ICD-10-CM | POA: Diagnosis not present

## 2017-06-04 DIAGNOSIS — I5033 Acute on chronic diastolic (congestive) heart failure: Secondary | ICD-10-CM | POA: Diagnosis not present

## 2017-06-04 DIAGNOSIS — I480 Paroxysmal atrial fibrillation: Secondary | ICD-10-CM | POA: Diagnosis not present

## 2017-06-04 DIAGNOSIS — N183 Chronic kidney disease, stage 3 (moderate): Secondary | ICD-10-CM | POA: Diagnosis not present

## 2017-06-04 DIAGNOSIS — E1122 Type 2 diabetes mellitus with diabetic chronic kidney disease: Secondary | ICD-10-CM | POA: Diagnosis not present

## 2017-06-04 DIAGNOSIS — L89312 Pressure ulcer of right buttock, stage 2: Secondary | ICD-10-CM | POA: Diagnosis not present

## 2017-06-08 DIAGNOSIS — I5033 Acute on chronic diastolic (congestive) heart failure: Secondary | ICD-10-CM | POA: Diagnosis not present

## 2017-06-08 DIAGNOSIS — L89312 Pressure ulcer of right buttock, stage 2: Secondary | ICD-10-CM | POA: Diagnosis not present

## 2017-06-08 DIAGNOSIS — E1122 Type 2 diabetes mellitus with diabetic chronic kidney disease: Secondary | ICD-10-CM | POA: Diagnosis not present

## 2017-06-08 DIAGNOSIS — N183 Chronic kidney disease, stage 3 (moderate): Secondary | ICD-10-CM | POA: Diagnosis not present

## 2017-06-08 DIAGNOSIS — I130 Hypertensive heart and chronic kidney disease with heart failure and stage 1 through stage 4 chronic kidney disease, or unspecified chronic kidney disease: Secondary | ICD-10-CM | POA: Diagnosis not present

## 2017-06-08 DIAGNOSIS — I480 Paroxysmal atrial fibrillation: Secondary | ICD-10-CM | POA: Diagnosis not present

## 2017-06-09 DIAGNOSIS — I130 Hypertensive heart and chronic kidney disease with heart failure and stage 1 through stage 4 chronic kidney disease, or unspecified chronic kidney disease: Secondary | ICD-10-CM | POA: Diagnosis not present

## 2017-06-09 DIAGNOSIS — I5033 Acute on chronic diastolic (congestive) heart failure: Secondary | ICD-10-CM | POA: Diagnosis not present

## 2017-06-09 DIAGNOSIS — E1122 Type 2 diabetes mellitus with diabetic chronic kidney disease: Secondary | ICD-10-CM | POA: Diagnosis not present

## 2017-06-09 DIAGNOSIS — I480 Paroxysmal atrial fibrillation: Secondary | ICD-10-CM | POA: Diagnosis not present

## 2017-06-09 DIAGNOSIS — L89312 Pressure ulcer of right buttock, stage 2: Secondary | ICD-10-CM | POA: Diagnosis not present

## 2017-06-09 DIAGNOSIS — N183 Chronic kidney disease, stage 3 (moderate): Secondary | ICD-10-CM | POA: Diagnosis not present

## 2017-06-10 DIAGNOSIS — L89312 Pressure ulcer of right buttock, stage 2: Secondary | ICD-10-CM | POA: Diagnosis not present

## 2017-06-10 DIAGNOSIS — I130 Hypertensive heart and chronic kidney disease with heart failure and stage 1 through stage 4 chronic kidney disease, or unspecified chronic kidney disease: Secondary | ICD-10-CM | POA: Diagnosis not present

## 2017-06-10 DIAGNOSIS — I480 Paroxysmal atrial fibrillation: Secondary | ICD-10-CM | POA: Diagnosis not present

## 2017-06-10 DIAGNOSIS — N183 Chronic kidney disease, stage 3 (moderate): Secondary | ICD-10-CM | POA: Diagnosis not present

## 2017-06-10 DIAGNOSIS — E1122 Type 2 diabetes mellitus with diabetic chronic kidney disease: Secondary | ICD-10-CM | POA: Diagnosis not present

## 2017-06-10 DIAGNOSIS — I5033 Acute on chronic diastolic (congestive) heart failure: Secondary | ICD-10-CM | POA: Diagnosis not present

## 2017-06-11 DIAGNOSIS — I5033 Acute on chronic diastolic (congestive) heart failure: Secondary | ICD-10-CM | POA: Diagnosis not present

## 2017-06-11 DIAGNOSIS — E1122 Type 2 diabetes mellitus with diabetic chronic kidney disease: Secondary | ICD-10-CM | POA: Diagnosis not present

## 2017-06-11 DIAGNOSIS — N183 Chronic kidney disease, stage 3 (moderate): Secondary | ICD-10-CM | POA: Diagnosis not present

## 2017-06-11 DIAGNOSIS — I130 Hypertensive heart and chronic kidney disease with heart failure and stage 1 through stage 4 chronic kidney disease, or unspecified chronic kidney disease: Secondary | ICD-10-CM | POA: Diagnosis not present

## 2017-06-11 DIAGNOSIS — I480 Paroxysmal atrial fibrillation: Secondary | ICD-10-CM | POA: Diagnosis not present

## 2017-06-11 DIAGNOSIS — L89312 Pressure ulcer of right buttock, stage 2: Secondary | ICD-10-CM | POA: Diagnosis not present

## 2017-06-16 DIAGNOSIS — E1122 Type 2 diabetes mellitus with diabetic chronic kidney disease: Secondary | ICD-10-CM | POA: Diagnosis not present

## 2017-06-16 DIAGNOSIS — N183 Chronic kidney disease, stage 3 (moderate): Secondary | ICD-10-CM | POA: Diagnosis not present

## 2017-06-16 DIAGNOSIS — I5033 Acute on chronic diastolic (congestive) heart failure: Secondary | ICD-10-CM | POA: Diagnosis not present

## 2017-06-16 DIAGNOSIS — L89312 Pressure ulcer of right buttock, stage 2: Secondary | ICD-10-CM | POA: Diagnosis not present

## 2017-06-16 DIAGNOSIS — I480 Paroxysmal atrial fibrillation: Secondary | ICD-10-CM | POA: Diagnosis not present

## 2017-06-16 DIAGNOSIS — I130 Hypertensive heart and chronic kidney disease with heart failure and stage 1 through stage 4 chronic kidney disease, or unspecified chronic kidney disease: Secondary | ICD-10-CM | POA: Diagnosis not present

## 2017-06-18 DIAGNOSIS — N183 Chronic kidney disease, stage 3 (moderate): Secondary | ICD-10-CM | POA: Diagnosis not present

## 2017-06-18 DIAGNOSIS — I130 Hypertensive heart and chronic kidney disease with heart failure and stage 1 through stage 4 chronic kidney disease, or unspecified chronic kidney disease: Secondary | ICD-10-CM | POA: Diagnosis not present

## 2017-06-18 DIAGNOSIS — L89312 Pressure ulcer of right buttock, stage 2: Secondary | ICD-10-CM | POA: Diagnosis not present

## 2017-06-18 DIAGNOSIS — I480 Paroxysmal atrial fibrillation: Secondary | ICD-10-CM | POA: Diagnosis not present

## 2017-06-18 DIAGNOSIS — E1122 Type 2 diabetes mellitus with diabetic chronic kidney disease: Secondary | ICD-10-CM | POA: Diagnosis not present

## 2017-06-18 DIAGNOSIS — I5033 Acute on chronic diastolic (congestive) heart failure: Secondary | ICD-10-CM | POA: Diagnosis not present

## 2017-06-19 DIAGNOSIS — E1122 Type 2 diabetes mellitus with diabetic chronic kidney disease: Secondary | ICD-10-CM | POA: Diagnosis not present

## 2017-06-19 DIAGNOSIS — I480 Paroxysmal atrial fibrillation: Secondary | ICD-10-CM | POA: Diagnosis not present

## 2017-06-19 DIAGNOSIS — I130 Hypertensive heart and chronic kidney disease with heart failure and stage 1 through stage 4 chronic kidney disease, or unspecified chronic kidney disease: Secondary | ICD-10-CM | POA: Diagnosis not present

## 2017-06-19 DIAGNOSIS — I5033 Acute on chronic diastolic (congestive) heart failure: Secondary | ICD-10-CM | POA: Diagnosis not present

## 2017-06-19 DIAGNOSIS — L89312 Pressure ulcer of right buttock, stage 2: Secondary | ICD-10-CM | POA: Diagnosis not present

## 2017-06-19 DIAGNOSIS — N183 Chronic kidney disease, stage 3 (moderate): Secondary | ICD-10-CM | POA: Diagnosis not present

## 2017-06-22 DIAGNOSIS — E1122 Type 2 diabetes mellitus with diabetic chronic kidney disease: Secondary | ICD-10-CM | POA: Diagnosis not present

## 2017-06-22 DIAGNOSIS — I130 Hypertensive heart and chronic kidney disease with heart failure and stage 1 through stage 4 chronic kidney disease, or unspecified chronic kidney disease: Secondary | ICD-10-CM | POA: Diagnosis not present

## 2017-06-22 DIAGNOSIS — L89312 Pressure ulcer of right buttock, stage 2: Secondary | ICD-10-CM | POA: Diagnosis not present

## 2017-06-22 DIAGNOSIS — I5033 Acute on chronic diastolic (congestive) heart failure: Secondary | ICD-10-CM | POA: Diagnosis not present

## 2017-06-22 DIAGNOSIS — N183 Chronic kidney disease, stage 3 (moderate): Secondary | ICD-10-CM | POA: Diagnosis not present

## 2017-06-22 DIAGNOSIS — I480 Paroxysmal atrial fibrillation: Secondary | ICD-10-CM | POA: Diagnosis not present

## 2017-06-23 DIAGNOSIS — E1122 Type 2 diabetes mellitus with diabetic chronic kidney disease: Secondary | ICD-10-CM | POA: Diagnosis not present

## 2017-06-23 DIAGNOSIS — I5021 Acute systolic (congestive) heart failure: Secondary | ICD-10-CM | POA: Diagnosis not present

## 2017-06-23 DIAGNOSIS — J9601 Acute respiratory failure with hypoxia: Secondary | ICD-10-CM | POA: Diagnosis not present

## 2017-06-23 DIAGNOSIS — E7849 Other hyperlipidemia: Secondary | ICD-10-CM | POA: Diagnosis not present

## 2017-06-23 DIAGNOSIS — R531 Weakness: Secondary | ICD-10-CM | POA: Diagnosis not present

## 2017-06-23 DIAGNOSIS — I10 Essential (primary) hypertension: Secondary | ICD-10-CM | POA: Diagnosis not present

## 2017-06-23 DIAGNOSIS — I480 Paroxysmal atrial fibrillation: Secondary | ICD-10-CM | POA: Diagnosis not present

## 2017-06-23 DIAGNOSIS — G4733 Obstructive sleep apnea (adult) (pediatric): Secondary | ICD-10-CM | POA: Diagnosis not present

## 2017-06-23 DIAGNOSIS — N183 Chronic kidney disease, stage 3 (moderate): Secondary | ICD-10-CM | POA: Diagnosis not present

## 2017-06-23 DIAGNOSIS — F3289 Other specified depressive episodes: Secondary | ICD-10-CM | POA: Diagnosis not present

## 2017-06-24 DIAGNOSIS — E1122 Type 2 diabetes mellitus with diabetic chronic kidney disease: Secondary | ICD-10-CM | POA: Diagnosis not present

## 2017-06-24 DIAGNOSIS — N183 Chronic kidney disease, stage 3 (moderate): Secondary | ICD-10-CM | POA: Diagnosis not present

## 2017-06-24 DIAGNOSIS — I5033 Acute on chronic diastolic (congestive) heart failure: Secondary | ICD-10-CM | POA: Diagnosis not present

## 2017-06-24 DIAGNOSIS — I130 Hypertensive heart and chronic kidney disease with heart failure and stage 1 through stage 4 chronic kidney disease, or unspecified chronic kidney disease: Secondary | ICD-10-CM | POA: Diagnosis not present

## 2017-06-24 DIAGNOSIS — L89312 Pressure ulcer of right buttock, stage 2: Secondary | ICD-10-CM | POA: Diagnosis not present

## 2017-06-24 DIAGNOSIS — I480 Paroxysmal atrial fibrillation: Secondary | ICD-10-CM | POA: Diagnosis not present

## 2017-06-25 DIAGNOSIS — L89312 Pressure ulcer of right buttock, stage 2: Secondary | ICD-10-CM | POA: Diagnosis not present

## 2017-06-25 DIAGNOSIS — E1122 Type 2 diabetes mellitus with diabetic chronic kidney disease: Secondary | ICD-10-CM | POA: Diagnosis not present

## 2017-06-25 DIAGNOSIS — I130 Hypertensive heart and chronic kidney disease with heart failure and stage 1 through stage 4 chronic kidney disease, or unspecified chronic kidney disease: Secondary | ICD-10-CM | POA: Diagnosis not present

## 2017-06-25 DIAGNOSIS — I5033 Acute on chronic diastolic (congestive) heart failure: Secondary | ICD-10-CM | POA: Diagnosis not present

## 2017-06-25 DIAGNOSIS — N183 Chronic kidney disease, stage 3 (moderate): Secondary | ICD-10-CM | POA: Diagnosis not present

## 2017-06-25 DIAGNOSIS — I480 Paroxysmal atrial fibrillation: Secondary | ICD-10-CM | POA: Diagnosis not present

## 2017-06-29 DIAGNOSIS — L89312 Pressure ulcer of right buttock, stage 2: Secondary | ICD-10-CM | POA: Diagnosis not present

## 2017-06-29 DIAGNOSIS — I5033 Acute on chronic diastolic (congestive) heart failure: Secondary | ICD-10-CM | POA: Diagnosis not present

## 2017-06-29 DIAGNOSIS — E1122 Type 2 diabetes mellitus with diabetic chronic kidney disease: Secondary | ICD-10-CM | POA: Diagnosis not present

## 2017-06-29 DIAGNOSIS — I130 Hypertensive heart and chronic kidney disease with heart failure and stage 1 through stage 4 chronic kidney disease, or unspecified chronic kidney disease: Secondary | ICD-10-CM | POA: Diagnosis not present

## 2017-06-29 DIAGNOSIS — N183 Chronic kidney disease, stage 3 (moderate): Secondary | ICD-10-CM | POA: Diagnosis not present

## 2017-06-29 DIAGNOSIS — I480 Paroxysmal atrial fibrillation: Secondary | ICD-10-CM | POA: Diagnosis not present

## 2017-07-02 DIAGNOSIS — I509 Heart failure, unspecified: Secondary | ICD-10-CM | POA: Diagnosis not present

## 2017-07-02 DIAGNOSIS — J309 Allergic rhinitis, unspecified: Secondary | ICD-10-CM | POA: Diagnosis not present

## 2017-07-02 DIAGNOSIS — L89312 Pressure ulcer of right buttock, stage 2: Secondary | ICD-10-CM | POA: Diagnosis not present

## 2017-07-02 DIAGNOSIS — I480 Paroxysmal atrial fibrillation: Secondary | ICD-10-CM | POA: Diagnosis not present

## 2017-07-02 DIAGNOSIS — N183 Chronic kidney disease, stage 3 (moderate): Secondary | ICD-10-CM | POA: Diagnosis not present

## 2017-07-02 DIAGNOSIS — I130 Hypertensive heart and chronic kidney disease with heart failure and stage 1 through stage 4 chronic kidney disease, or unspecified chronic kidney disease: Secondary | ICD-10-CM | POA: Diagnosis not present

## 2017-07-02 DIAGNOSIS — I5033 Acute on chronic diastolic (congestive) heart failure: Secondary | ICD-10-CM | POA: Diagnosis not present

## 2017-07-02 DIAGNOSIS — I10 Essential (primary) hypertension: Secondary | ICD-10-CM | POA: Diagnosis not present

## 2017-07-02 DIAGNOSIS — F0390 Unspecified dementia without behavioral disturbance: Secondary | ICD-10-CM | POA: Diagnosis not present

## 2017-07-02 DIAGNOSIS — E1122 Type 2 diabetes mellitus with diabetic chronic kidney disease: Secondary | ICD-10-CM | POA: Diagnosis not present

## 2017-07-02 DIAGNOSIS — I5021 Acute systolic (congestive) heart failure: Secondary | ICD-10-CM | POA: Diagnosis not present

## 2017-07-02 DIAGNOSIS — Z6825 Body mass index (BMI) 25.0-25.9, adult: Secondary | ICD-10-CM | POA: Diagnosis not present

## 2017-07-06 ENCOUNTER — Other Ambulatory Visit: Payer: Self-pay | Admitting: *Deleted

## 2017-07-06 DIAGNOSIS — N183 Chronic kidney disease, stage 3 (moderate): Secondary | ICD-10-CM | POA: Diagnosis not present

## 2017-07-06 DIAGNOSIS — I5033 Acute on chronic diastolic (congestive) heart failure: Secondary | ICD-10-CM | POA: Diagnosis not present

## 2017-07-06 DIAGNOSIS — L89312 Pressure ulcer of right buttock, stage 2: Secondary | ICD-10-CM | POA: Diagnosis not present

## 2017-07-06 DIAGNOSIS — I130 Hypertensive heart and chronic kidney disease with heart failure and stage 1 through stage 4 chronic kidney disease, or unspecified chronic kidney disease: Secondary | ICD-10-CM | POA: Diagnosis not present

## 2017-07-06 DIAGNOSIS — E1122 Type 2 diabetes mellitus with diabetic chronic kidney disease: Secondary | ICD-10-CM | POA: Diagnosis not present

## 2017-07-06 DIAGNOSIS — I480 Paroxysmal atrial fibrillation: Secondary | ICD-10-CM | POA: Diagnosis not present

## 2017-07-06 MED ORDER — METOPROLOL TARTRATE 50 MG PO TABS: ORAL_TABLET | ORAL | 1 refills | Status: DC

## 2017-07-15 ENCOUNTER — Telehealth: Payer: Self-pay | Admitting: Cardiology

## 2017-07-15 ENCOUNTER — Telehealth: Payer: Self-pay | Admitting: Cardiovascular Disease

## 2017-07-15 ENCOUNTER — Ambulatory Visit (INDEPENDENT_AMBULATORY_CARE_PROVIDER_SITE_OTHER): Payer: Medicare Other | Admitting: *Deleted

## 2017-07-15 DIAGNOSIS — L89312 Pressure ulcer of right buttock, stage 2: Secondary | ICD-10-CM | POA: Diagnosis not present

## 2017-07-15 DIAGNOSIS — E1122 Type 2 diabetes mellitus with diabetic chronic kidney disease: Secondary | ICD-10-CM | POA: Diagnosis not present

## 2017-07-15 DIAGNOSIS — I442 Atrioventricular block, complete: Secondary | ICD-10-CM | POA: Diagnosis not present

## 2017-07-15 DIAGNOSIS — I5033 Acute on chronic diastolic (congestive) heart failure: Secondary | ICD-10-CM | POA: Diagnosis not present

## 2017-07-15 DIAGNOSIS — I480 Paroxysmal atrial fibrillation: Secondary | ICD-10-CM | POA: Diagnosis not present

## 2017-07-15 DIAGNOSIS — I5042 Chronic combined systolic (congestive) and diastolic (congestive) heart failure: Secondary | ICD-10-CM

## 2017-07-15 DIAGNOSIS — I130 Hypertensive heart and chronic kidney disease with heart failure and stage 1 through stage 4 chronic kidney disease, or unspecified chronic kidney disease: Secondary | ICD-10-CM | POA: Diagnosis not present

## 2017-07-15 DIAGNOSIS — N183 Chronic kidney disease, stage 3 (moderate): Secondary | ICD-10-CM | POA: Diagnosis not present

## 2017-07-15 MED ORDER — POTASSIUM CHLORIDE ER 10 MEQ PO TBCR: 20.0000 meq | EXTENDED_RELEASE_TABLET | Freq: Two times a day (BID) | ORAL | 3 refills | Status: DC

## 2017-07-15 MED ORDER — FUROSEMIDE 40 MG PO TABS: 40.0000 mg | ORAL_TABLET | Freq: Two times a day (BID) | ORAL | 3 refills | Status: DC

## 2017-07-15 NOTE — Progress Notes (Signed)
Remote pacemaker transmission.   

## 2017-07-15 NOTE — Telephone Encounter (Signed)
Spoke with Larene Beach, home health nurse who is currently at patient's home. She states she was called to the patient's house by his wife for patient's increased SOB, and decreased O2 sats in the 80%. Larene Beach states the wife had put the patient's CPAP on him and his O2 sat had improved to the 90's%. She states he is maintaining low 90's % on RA. She states patient has fine crackles in bases of bilateral lungs. She reports increased ankle measurements and weight gain of 2 lbs. She states his breathing is labored at times if he is slumped down, but improves when he sits up. Patient's last echo from September 2018 shows EF of 50-55%. Patient is currently taking lasix 40 mg daily. Dr. Acie Fredrickson, who is in the office, advised that patient may increase lasix to 40 mg twice daily. His last bmet shows hypokalemia so he advised patient needs to increase kdur to 20 meq twice daily. He asked me to schedule the patient with APP next week and get bmet at that time. I scheduled the patient with Ellen Henri, PA on Tuesday 11/27. The wife verbalized understanding and agreement from the background. Larene Beach thanked me for our help.

## 2017-07-15 NOTE — Telephone Encounter (Signed)
Larene Beach calling with [facility], would like to speak with you about patient's O2 levels.

## 2017-07-15 NOTE — Telephone Encounter (Signed)
Confirmed remote transmission w/ pt wife.   

## 2017-07-21 ENCOUNTER — Ambulatory Visit (INDEPENDENT_AMBULATORY_CARE_PROVIDER_SITE_OTHER): Payer: Medicare Other | Admitting: Cardiology

## 2017-07-21 ENCOUNTER — Encounter: Payer: Self-pay | Admitting: Cardiology

## 2017-07-21 ENCOUNTER — Ambulatory Visit
Admission: RE | Admit: 2017-07-21 | Discharge: 2017-07-21 | Disposition: A | Discharge status: Home / Self Care | Payer: Medicare Other | Source: Ambulatory Visit | Attending: Cardiology | Admitting: Cardiology

## 2017-07-21 ENCOUNTER — Encounter (INDEPENDENT_AMBULATORY_CARE_PROVIDER_SITE_OTHER): Payer: Self-pay

## 2017-07-21 VITALS — BP 132/82 | HR 64 | Ht 75.0 in | Wt 199.5 lb

## 2017-07-21 DIAGNOSIS — R0609 Other forms of dyspnea: Secondary | ICD-10-CM

## 2017-07-21 DIAGNOSIS — I442 Atrioventricular block, complete: Secondary | ICD-10-CM | POA: Diagnosis not present

## 2017-07-21 DIAGNOSIS — I480 Paroxysmal atrial fibrillation: Secondary | ICD-10-CM

## 2017-07-21 DIAGNOSIS — R0902 Hypoxemia: Secondary | ICD-10-CM

## 2017-07-21 DIAGNOSIS — I5042 Chronic combined systolic (congestive) and diastolic (congestive) heart failure: Secondary | ICD-10-CM | POA: Diagnosis not present

## 2017-07-21 DIAGNOSIS — R05 Cough: Secondary | ICD-10-CM | POA: Diagnosis not present

## 2017-07-21 NOTE — Progress Notes (Signed)
07/21/2017 Richard [name]   26-Oct-1930  [id number]  Primary Physician Prince Solian, MD Primary Cardiologist: Dr. Acie Fredrickson  Electrophysiologist: Dr. Rayann Heman   Reason for Visit/CC: Dyspnea and Hypoxia   HPI:  Richard [name] is a 83 y.o. male, followed by Dr. Acie Fredrickson, who is being seen today for evaluation for dyspnea and hypoxia.   He has a h/o chronic diastolic HF, atrial fibrillation no longer on a/c given advanced age, fragility and high fall/bleed risk. He was referred for consideration for a Watchman's device, however was felt to be too weak for the surgery. He also has CHB s/p PPM, followed by Dr. Rayann Heman. Additional PMH includes H/o stroke, OSA on CPAP therapy, HTN, HLD, T2DM and hypothyroidism. Most recent echocardiogram 04/2017 showed normal LVEF at 50-55% and G1DD. He lives at home with his wife and has a care taker.  His wife called the office on 07/15/17 with complaints of resting dyspnea, LEE and weight gain. No wheezing. A home health nurse checked his O2 stats and he was hypoxic in the 33s. Lung exam, per report, also was + for faint crackles. He was placed on supplemental O2 via [place] (baseline is nocturnal O2 at night only w/ CPAP). Breathing improved. He was given instruction by Dr. Cathie Olden, via phone, to increase his home lasix from 40 mg once daily to 40 mg BID and to increase supplemental potassium to 20 mEq BID.   He presents back to clinic today for f/u. He is here with his wife and caretaker. They noted that weight has been trending downward and his lower extremity edema has resolved, however he continues to have exertional dyspnea with short ambulation around his house and has been unable to come off of supplemental O2, since it was started 1 week ago. He continues to be hypoxic on RA with O2 sats still dropping in the 80s.  In clinic, he appears comfortable with supplemental O2. RRR. No increased WOB. He denies cough, fever and chills. No weight gain. BP is stable on higher  dose of Lasix. He denies melena, however his wife notes his stools this week have been green in color. He is on daily Fe supplementation for IDA.     Current Meds  Medication Sig  . acetaminophen (TYLENOL) 500 MG tablet Take 500 mg by mouth 2 (two) times daily.   Marland Kitchen amiodarone (PACERONE) 200 MG tablet Take 200 mg by mouth every Monday, Wednesday, and Friday.   Marland Kitchen aspirin EC 81 MG tablet Take 1 tablet (81 mg total) by mouth daily.  Marland Kitchen atorvastatin (LIPITOR) 40 MG tablet Take 1 tablet (40 mg total) by mouth daily at 6 PM.  . cyanocobalamin (,VITAMIN B-12,) 1000 MCG/ML injection Inject 1,000 mcg into the muscle every 30 (thirty) days.  . diazepam (VALIUM) 5 MG tablet Take 0.5 tablets (2.5 mg total) by mouth at bedtime.  . divalproex (DEPAKOTE) 250 MG DR tablet Take 1 tablet by mouth 2 (two) times daily.  . DULoxetine (CYMBALTA) 60 MG capsule Take 60 mg by mouth every evening.  . ferrous sulfate 325 (65 FE) MG tablet Take 325 mg by mouth daily with breakfast.  . fluticasone (FLONASE) 50 MCG/ACT nasal spray Place 1 spray into the nose daily as needed for allergies or rhinitis.   . furosemide (LASIX) 40 MG tablet Take 1 tablet (40 mg total) by mouth 2 (two) times daily.  Marland Kitchen levothyroxine (SYNTHROID, LEVOTHROID) 25 MCG tablet Take 25 mcg by mouth daily before breakfast.  . lisinopril (PRINIVIL,ZESTRIL) 2.5  MG tablet Take 1 tablet (2.5 mg total) by mouth daily.  . metoprolol tartrate (LOPRESSOR) 50 MG tablet Take 50 mg by mouth twice daily  . pantoprazole (PROTONIX) 40 MG tablet Take 1 tablet (40 mg total) by mouth daily at 6 (six) AM.  . potassium chloride (K-DUR) 10 MEQ tablet Take 2 tablets (20 mEq total) by mouth 2 (two) times daily.   Allergies  Allergen Reactions  . Codeine Other (See Comments)    Hallucinations   . Citalopram Swelling and Rash   Past Medical History:  Diagnosis Date  . Anxiety   . Arthritis   . Atrial fibrillation ([place])    a. Dx 03/2013, notes report atrial  fibrillation/atrial flutter, placed on amiodarone. NSR in subsequent OV's.  . B12 deficiency anemia   . Cancer ([place])   . Chest pain    a. H/o CTA negative for PE 2012, normal cath 2005, normal nucs previously including 05/2012.  Marland Kitchen Complete heart block (HCC)    a. s/p Medtronic Adapta L model ADDRL 1 (serial number [id number]) pacemaker.  . Dementia   . GERD (gastroesophageal reflux disease)   . Hiatal hernia   . Hyperlipidemia   . Hypertension   . Iron deficiency anemia   . LBBB (left bundle branch block)   . Microcytic anemia   . Nephrolithiasis   . On home oxygen therapy    a. 2L w/CPAP at night  . Orthostatic hypotension   . OSA on CPAP    setting = 4  . Pacemaker   . [place] spotted fever ~ 1945  . Sinus drainage   . Stroke ([place]) 05/2016  . Type II diabetes mellitus (HCC)    Family History  Problem Relation Age of Onset  . Thyroid disease Mother        goiter  . Pneumonia Father   . Other Unknown        Parents both died of old age; other conditions not known  . Stroke Brother   . Breast cancer Sister    Past Surgical History:  Procedure Laterality Date  . CARDIAC CATHETERIZATION     by Dr. Acie Fredrickson, January 24, 2004, that shows minimal coronary artery irregularities and normal left ventricular function  . CATARACT EXTRACTION W/ INTRAOCULAR LENS  IMPLANT, BILATERAL Bilateral   . COLONOSCOPY WITH PROPOFOL N/A 05/23/2016   Procedure: COLONOSCOPY WITH PROPOFOL;  Surgeon: Jerene Bears, MD;  Location: [facility];  Service: Gastroenterology;  Laterality: N/A;  . ENTEROSCOPY N/A 06/02/2016   Procedure: ENTEROSCOPY;  Surgeon: Gatha Mayer, MD;  Location: [facility];  Service: Endoscopy;  Laterality: N/A;  . ESOPHAGOGASTRODUODENOSCOPY N/A 05/22/2016   Procedure: ESOPHAGOGASTRODUODENOSCOPY (EGD);  Surgeon: Jerene Bears, MD;  Location: [facility]OSCOPY;  Service: Endoscopy;  Laterality: N/A;  . ESOPHAGOGASTRODUODENOSCOPY (EGD) WITH PROPOFOL N/A 06/21/2015   Procedure:  ESOPHAGOGASTRODUODENOSCOPY (EGD) WITH PROPOFOL;  Surgeon: Gatha Mayer, MD;  Location: [facility] ENDOSCOPY;  Service: Endoscopy;  Laterality: N/A;  . GIVENS CAPSULE STUDY N/A 05/23/2016   Procedure: GIVENS CAPSULE STUDY;  Surgeon: Jerene Bears, MD;  Location: [place];  Service: Gastroenterology;  Laterality: N/A;  . HOT HEMOSTASIS N/A 06/02/2016   Procedure: HOT HEMOSTASIS (ARGON PLASMA COAGULATION/BICAP);  Surgeon: Gatha Mayer, MD;  Location: [facility];  Service: Endoscopy;  Laterality: N/A;  . INGUINAL HERNIA REPAIR Right   . IR RADIOLOGIST EVAL & MGMT  11/25/2016  . IR RADIOLOGIST EVAL & MGMT  01/22/2017  . IR VERTEBROPLASTY CERV/THOR BX INC  UNI/BIL INC/INJECT/IMAGING  01/30/2017  . IR VERTEBROPLASTY EA ADDL (T&LS) BX INC UNI/BIL INC INJECT/IMAGING  11/28/2016  . IR VERTEBROPLASTY EA ADDL (T&LS) BX INC UNI/BIL INC INJECT/IMAGING  01/30/2017  . IR VERTEBROPLASTY LUMBAR BX INC UNI/BIL INC/INJECT/IMAGING  11/28/2016  . LUMBAR DISC SURGERY  ~ 1993  . PERMANENT PACEMAKER INSERTION N/A 06/03/2014   MDT Adapta L implanted by Dr Rayann Heman for syncope and transient AV block  . SKIN CANCER EXCISION     "lower lip" (04/06/2013)  . TEMPORARY PACEMAKER INSERTION N/A 05/31/2014   Procedure: TEMPORARY WIRE;  Surgeon: Sinclair Grooms, MD;  Location: [facility] CATH LAB;  Service: Cardiovascular;  Laterality: N/A;  . VASECTOMY     Hx of    Social History   Socioeconomic History  . Marital status: Married    Spouse name: Not on file  . Number of children: 2  . Years of education: Not on file  . Highest education level: Not on file  Social Needs  . Financial resource strain: Not on file  . Food insecurity - worry: Not on file  . Food insecurity - inability: Not on file  . Transportation needs - medical: Not on file  . Transportation needs - non-medical: Not on file  Occupational History  . Occupation: retired  Tobacco Use  . Smoking status: Former Smoker    Packs/day: 0.00    Years: 20.00    Pack years: 0.00     Types: Cigarettes  . Smokeless tobacco: Never Used  . Tobacco comment: 04/06/2013 "quit smoking years ago; didn't smoke that much; probably 20 years; 1ppd"  Substance and Sexual Activity  . Alcohol use: No  . Drug use: No  . Sexual activity: No  Other Topics Concern  . Not on file  Social History Narrative  . Not on file     Review of Systems: General: negative for chills, fever, night sweats or weight changes.  Cardiovascular: negative for chest pain, dyspnea on exertion, edema, orthopnea, palpitations, paroxysmal nocturnal dyspnea or shortness of breath Dermatological: negative for rash Respiratory: negative for cough or wheezing Urologic: negative for hematuria Abdominal: negative for nausea, vomiting, diarrhea, bright red blood per rectum, melena, or hematemesis Neurologic: negative for visual changes, syncope, or dizziness All other systems reviewed and are otherwise negative except as noted above.   Physical Exam:  Blood pressure 132/82, pulse 64, height 6\' 3"  (1.905 m), weight 199 lb 8 oz (90.5 kg), SpO2 (!) 88 %.  General appearance: alert, cooperative and no distress Neck: no carotid bruit and no JVD Lungs: decreased BS bilaterally at the bases, L>R Heart: regular rate and rhythm, S1, S2 normal, no murmur, click, rub or gallop Extremities: extremities normal, atraumatic, no cyanosis or edema Pulses: 2+ and symmetric Skin: Skin color, texture, turgor normal. No rashes or lesions Neurologic: Grossly normal  EKG not performed -- personally reviewed   ASSESSMENT AND PLAN:   1. Exertional Dyspnea and Hypoxia: O2 sats are stable w/ supplemental O2, however drop in the 80s on RA. Pt is symptomatic w/ exertional dyspnea. No resting dyspnea. Also denies CP. He has had improvement with increased dose of Lasix, in regards to weight loss and improvement in LEE. His lung exam is abnormal with diminished lung sounds bilaterally, L>R. Other than dyspnea, no other clinical features  c/w PNA. No cough, fever or chills. Previous CXR from 04/2017 reviewed. He was noted to have small bilaterally pleural effusions at the time. We will repeat another CXR to insure no  interval change/ worsening effusions. Continue Lasix 40 mg BID for now. Will also check CBC and CMP, as well as BNP.   2. Acute on Chronic Diastolic HF: likely acute diastolic CHF contributing to recent symptoms, however will obtain f/u CXR to ensure no significant pleural effusions as well. We will check a BNP level today to help guide further diuresis. His LEE has resolved and weight is trending downward. BP is stable on higher dose of Lasix. We will check a BMP today to ensure renal function and electrolytes are stable. For now, continue Lasix 40 mg BID and Kdur 20 mEq BID until results return.   3. Atrial Fibrillation: RRR on exam. HR is controlled. He is no longer on a/c given given advanced age, fragility and high fall/bleed risk. He was referred for consideration for a Watchman's device, however was felt to be too weak for the surgery.  4. CHB: has PPM. Followed by Dr. Rayann Heman.    Follow-Up: Pt is stable on supplemental O2 at this time. He is stable enough for continued outpatient management for now. We will provide recs regarding medication adjustments, based on results of laboratory work and CXR. He was instructed to keep his f/u with Dr. Acie Fredrickson on 08/07/17.    Ladoris Gene, MHS  [facility] 07/21/2017 11:40 AM

## 2017-07-21 NOTE — Patient Instructions (Addendum)
Medication Instructions: Your physician recommends that you continue on your current medications as directed. Please refer to the Current Medication list given to you today.  Labwork: Your physician has recommended that you have lab work today: CMET, CBC, BNP   Procedures/Testing: Your physician recommends that you have a chest x-ray. A chest x-ray takes a picture of the organs and structures inside the chest, including the heart, lungs, and blood vessels. This test can show several things, including, whether the heart is enlarges; whether fluid is building up in the lungs  Chest X-ray Instructions:     1. You may have this done at the [facility], located in the         [place] on the 1st floor.    2. You do no have to have an appointment.    3. [place], [place] [address]        [phone]        Monday - Friday  8:00 am - 5:00 pm  Follow-Up: Your physician recommends that you keep your scheduled follow-up appointment in December with Dr. Acie Fredrickson.     If you need a refill on your cardiac medications before your next appointment, please call your pharmacy.

## 2017-07-22 ENCOUNTER — Telehealth: Payer: Self-pay | Admitting: Cardiovascular Disease

## 2017-07-22 ENCOUNTER — Telehealth: Payer: Self-pay | Admitting: *Deleted

## 2017-07-22 DIAGNOSIS — I5033 Acute on chronic diastolic (congestive) heart failure: Secondary | ICD-10-CM | POA: Diagnosis not present

## 2017-07-22 DIAGNOSIS — N183 Chronic kidney disease, stage 3 (moderate): Secondary | ICD-10-CM | POA: Diagnosis not present

## 2017-07-22 DIAGNOSIS — E1122 Type 2 diabetes mellitus with diabetic chronic kidney disease: Secondary | ICD-10-CM | POA: Diagnosis not present

## 2017-07-22 DIAGNOSIS — I130 Hypertensive heart and chronic kidney disease with heart failure and stage 1 through stage 4 chronic kidney disease, or unspecified chronic kidney disease: Secondary | ICD-10-CM | POA: Diagnosis not present

## 2017-07-22 DIAGNOSIS — L89312 Pressure ulcer of right buttock, stage 2: Secondary | ICD-10-CM | POA: Diagnosis not present

## 2017-07-22 DIAGNOSIS — Z79899 Other long term (current) drug therapy: Secondary | ICD-10-CM

## 2017-07-22 DIAGNOSIS — I480 Paroxysmal atrial fibrillation: Secondary | ICD-10-CM | POA: Diagnosis not present

## 2017-07-22 LAB — COMPREHENSIVE METABOLIC PANEL
ALBUMIN: 3.2 g/dL — AB (ref 3.5–4.7)
ALK PHOS: 141 IU/L — AB (ref 39–117)
ALT: 21 IU/L (ref 0–44)
AST: 30 IU/L (ref 0–40)
Albumin/Globulin Ratio: 0.8 — ABNORMAL LOW (ref 1.2–2.2)
BILIRUBIN TOTAL: 0.4 mg/dL (ref 0.0–1.2)
BUN / CREAT RATIO: 20 (ref 10–24)
BUN: 29 mg/dL — ABNORMAL HIGH (ref 8–27)
CHLORIDE: 98 mmol/L (ref 96–106)
CO2: 27 mmol/L (ref 20–29)
Calcium: 9 mg/dL (ref 8.6–10.2)
Creatinine, Ser: 1.45 mg/dL — ABNORMAL HIGH (ref 0.76–1.27)
GFR calc non Af Amer: 44 mL/min/{1.73_m2} — ABNORMAL LOW (ref >59)
GFR, EST AFRICAN AMERICAN: 51 mL/min/{1.73_m2} — AB (ref >59)
GLOBULIN, TOTAL: 3.9 g/dL (ref 1.5–4.5)
Glucose: 132 mg/dL — ABNORMAL HIGH (ref 65–99)
POTASSIUM: 5.1 mmol/L (ref 3.5–5.2)
Sodium: 141 mmol/L (ref 134–144)
Total Protein: 7.1 g/dL (ref 6.0–8.5)

## 2017-07-22 LAB — CBC WITH DIFFERENTIAL/PLATELET
BASOS: 1 %
Basophils Absolute: 0.1 10*3/uL (ref 0.0–0.2)
EOS (ABSOLUTE): 0.4 10*3/uL (ref 0.0–0.4)
Eos: 6 %
Hematocrit: 31 % — ABNORMAL LOW (ref 37.5–51.0)
Hemoglobin: 9.7 g/dL — ABNORMAL LOW (ref 13.0–17.7)
Immature Grans (Abs): 0 10*3/uL (ref 0.0–0.1)
Immature Granulocytes: 0 %
Lymphocytes Absolute: 1.3 10*3/uL (ref 0.7–3.1)
Lymphs: 18 %
MCH: 28.9 pg (ref 26.6–33.0)
MCHC: 31.3 g/dL — ABNORMAL LOW (ref 31.5–35.7)
MCV: 92 fL (ref 79–97)
MONOS ABS: 0.8 10*3/uL (ref 0.1–0.9)
Monocytes: 11 %
NEUTROS ABS: 4.6 10*3/uL (ref 1.4–7.0)
NEUTROS PCT: 64 %
PLATELETS: 266 10*3/uL (ref 150–379)
RBC: 3.36 x10E6/uL — ABNORMAL LOW (ref 4.14–5.80)
RDW: 18.2 % — AB (ref 12.3–15.4)
WBC: 7.2 10*3/uL (ref 3.4–10.8)

## 2017-07-22 LAB — PRO B NATRIURETIC PEPTIDE: NT-Pro BNP: 13733 pg/mL — ABNORMAL HIGH (ref 0–486)

## 2017-07-22 MED ORDER — FUROSEMIDE 40 MG PO TABS: 60.0000 mg | ORAL_TABLET | Freq: Two times a day (BID) | ORAL | 3 refills | Status: DC

## 2017-07-22 NOTE — Telephone Encounter (Signed)
See result note.  

## 2017-07-22 NOTE — Telephone Encounter (Signed)
New message    Larene Beach with [facility] calling (Phone [phone])  to request order for labs to be done at the patients home next week instead of patient coming into office. Please call.

## 2017-07-22 NOTE — Telephone Encounter (Signed)
Nurse Larene Beach from [facility] called. She is seen pt next week for home visit. nurse wants to know if she can draw pt's blood work in his home instead of pt   coming to this office to have it done . Larene Beach is aware that it will be fine. Labs needed to done is BMP, pro BNP, Alkaline phosphate. Larene Beach verbalized understanding.

## 2017-07-24 ENCOUNTER — Encounter: Payer: Self-pay | Admitting: Cardiology

## 2017-07-25 DIAGNOSIS — F418 Other specified anxiety disorders: Secondary | ICD-10-CM | POA: Diagnosis not present

## 2017-07-25 DIAGNOSIS — Z9981 Dependence on supplemental oxygen: Secondary | ICD-10-CM | POA: Diagnosis not present

## 2017-07-25 DIAGNOSIS — I480 Paroxysmal atrial fibrillation: Secondary | ICD-10-CM | POA: Diagnosis not present

## 2017-07-25 DIAGNOSIS — N183 Chronic kidney disease, stage 3 (moderate): Secondary | ICD-10-CM | POA: Diagnosis not present

## 2017-07-25 DIAGNOSIS — Z950 Presence of cardiac pacemaker: Secondary | ICD-10-CM | POA: Diagnosis not present

## 2017-07-25 DIAGNOSIS — G4733 Obstructive sleep apnea (adult) (pediatric): Secondary | ICD-10-CM | POA: Diagnosis not present

## 2017-07-25 DIAGNOSIS — F0390 Unspecified dementia without behavioral disturbance: Secondary | ICD-10-CM | POA: Diagnosis not present

## 2017-07-25 DIAGNOSIS — E1122 Type 2 diabetes mellitus with diabetic chronic kidney disease: Secondary | ICD-10-CM | POA: Diagnosis not present

## 2017-07-25 DIAGNOSIS — Z8673 Personal history of transient ischemic attack (TIA), and cerebral infarction without residual deficits: Secondary | ICD-10-CM | POA: Diagnosis not present

## 2017-07-25 DIAGNOSIS — I5033 Acute on chronic diastolic (congestive) heart failure: Secondary | ICD-10-CM | POA: Diagnosis not present

## 2017-07-25 DIAGNOSIS — Z7951 Long term (current) use of inhaled steroids: Secondary | ICD-10-CM | POA: Diagnosis not present

## 2017-07-25 DIAGNOSIS — Z7982 Long term (current) use of aspirin: Secondary | ICD-10-CM | POA: Diagnosis not present

## 2017-07-25 DIAGNOSIS — Z87891 Personal history of nicotine dependence: Secondary | ICD-10-CM | POA: Diagnosis not present

## 2017-07-25 DIAGNOSIS — Z9181 History of falling: Secondary | ICD-10-CM | POA: Diagnosis not present

## 2017-07-25 DIAGNOSIS — I130 Hypertensive heart and chronic kidney disease with heart failure and stage 1 through stage 4 chronic kidney disease, or unspecified chronic kidney disease: Secondary | ICD-10-CM | POA: Diagnosis not present

## 2017-07-28 ENCOUNTER — Other Ambulatory Visit: Payer: Medicare Other

## 2017-07-28 DIAGNOSIS — I130 Hypertensive heart and chronic kidney disease with heart failure and stage 1 through stage 4 chronic kidney disease, or unspecified chronic kidney disease: Secondary | ICD-10-CM | POA: Diagnosis not present

## 2017-07-28 DIAGNOSIS — I509 Heart failure, unspecified: Secondary | ICD-10-CM | POA: Diagnosis not present

## 2017-07-28 DIAGNOSIS — I5033 Acute on chronic diastolic (congestive) heart failure: Secondary | ICD-10-CM | POA: Diagnosis not present

## 2017-07-28 DIAGNOSIS — F0390 Unspecified dementia without behavioral disturbance: Secondary | ICD-10-CM | POA: Diagnosis not present

## 2017-07-28 DIAGNOSIS — E119 Type 2 diabetes mellitus without complications: Secondary | ICD-10-CM | POA: Diagnosis not present

## 2017-07-28 DIAGNOSIS — I480 Paroxysmal atrial fibrillation: Secondary | ICD-10-CM | POA: Diagnosis not present

## 2017-07-28 DIAGNOSIS — E1122 Type 2 diabetes mellitus with diabetic chronic kidney disease: Secondary | ICD-10-CM | POA: Diagnosis not present

## 2017-07-28 DIAGNOSIS — N183 Chronic kidney disease, stage 3 (moderate): Secondary | ICD-10-CM | POA: Diagnosis not present

## 2017-07-29 DIAGNOSIS — E1122 Type 2 diabetes mellitus with diabetic chronic kidney disease: Secondary | ICD-10-CM | POA: Diagnosis not present

## 2017-07-29 DIAGNOSIS — N183 Chronic kidney disease, stage 3 (moderate): Secondary | ICD-10-CM | POA: Diagnosis not present

## 2017-07-29 DIAGNOSIS — F0390 Unspecified dementia without behavioral disturbance: Secondary | ICD-10-CM | POA: Diagnosis not present

## 2017-07-29 DIAGNOSIS — I5033 Acute on chronic diastolic (congestive) heart failure: Secondary | ICD-10-CM | POA: Diagnosis not present

## 2017-07-29 DIAGNOSIS — I130 Hypertensive heart and chronic kidney disease with heart failure and stage 1 through stage 4 chronic kidney disease, or unspecified chronic kidney disease: Secondary | ICD-10-CM | POA: Diagnosis not present

## 2017-07-29 DIAGNOSIS — I480 Paroxysmal atrial fibrillation: Secondary | ICD-10-CM | POA: Diagnosis not present

## 2017-07-30 ENCOUNTER — Encounter: Payer: Self-pay | Admitting: Cardiovascular Disease

## 2017-07-30 ENCOUNTER — Ambulatory Visit (INDEPENDENT_AMBULATORY_CARE_PROVIDER_SITE_OTHER): Payer: Medicare Other | Admitting: Cardiovascular Disease

## 2017-07-30 ENCOUNTER — Encounter (INDEPENDENT_AMBULATORY_CARE_PROVIDER_SITE_OTHER): Payer: Self-pay

## 2017-07-30 VITALS — BP 110/58 | HR 66 | Ht 74.0 in | Wt 192.4 lb

## 2017-07-30 DIAGNOSIS — I5032 Chronic diastolic (congestive) heart failure: Secondary | ICD-10-CM | POA: Diagnosis not present

## 2017-07-30 MED ORDER — FUROSEMIDE 40 MG PO TABS: 60.0000 mg | ORAL_TABLET | Freq: Every day | ORAL | 3 refills | Status: DC

## 2017-07-30 MED ORDER — POTASSIUM CHLORIDE ER 10 MEQ PO TBCR: 20.0000 meq | EXTENDED_RELEASE_TABLET | Freq: Every day | ORAL | 3 refills | Status: DC

## 2017-07-30 NOTE — Progress Notes (Signed)
Cardiology Office Note   Date:  07/30/2017   ID:  LENNIS KORB, DOB 03-29-1931, MRN [id number]  PCP:  Prince Solian, MD  Cardiologist:   Mertie Moores, MD   Chief Complaint  Patient presents with  . Follow-up    Chronic diastolic CHF, PAF     Problem List 1. Atrial fib 2. Essential Hypertension 3. Microcytic anemia  4. Pacer 5, diabetes Mellitus    Jan 17, 2015:  LIBAN GUEDES is a 83 y.o. male who presents for  His atrial fib Having lots of problems with anemia.  No blood in stool Being set up to see a hematologist  Still eating salt frequently  Nov. 22, 2016:    Doing well from a cardiac standpoint. Having back pain . , had an injection in his back . Having problems with anemia   February 01, 2016:  Doing great from a cardiac standpoint Is falling frequently .   Is very weak Has been seen by EP - thought not to be related to bradycardia or hypotension .  Possibly dehydration Legs are wobbly  Discussed orthostasis - his symptoms do not sound like orthstasis   Dec. 5, 2017:  Doing well. No CP or dyspnea Still falling on occasion. Fell Thanksgiving night.   [place] off his riding lawnmower yesterday.  Is a poor candidate for chronic anticoagulation.  Is on Eliquis currently.   Was seen by Dr. Rayann Heman, was set up to begin eval for a Watchman.   He was scheduled to have a TEE  But was not able to go because of generalized weakness.   November 14, 2016: Still very unsteady .  Fell several weeks ago , hurt his back  No CP or dyspnea.   Had some shortness of breath on one office visit with Avva.  Was started on Lasix PRN .  Echo in Sept.  - normal EF of 65% .   Sleep with home home O2 with his CPAP.   January 29, 2017  Mr. Rabbani is seen again today  Is having more dementia. Has chronic diastolic CHF Was in the hospital in April 19 with possible volume depletion .   July 30, 2017: Seen with wife, Primary MD is Dr. Dagmar Hait  Mr. Gutridge is seen today for  follow-up visit.  Examined in the wheelchair. He was hospitalized from September 28 - September 30 for congestive heart failure and generalized weakness. Is very sleepy .   Has had rales according to home health nurses.   Labs drawn by home health yesterday include the following Creatinine equals 1.6, BUN equals 51, potassium is 5.7.  ProBNP is 6252.  He has been having dark stool.  It should be noted that he is on iron. Eats breakfast fairly well but then does not eat all that well  Has a sallow appearance   Past Medical History:  Diagnosis Date  . Anxiety   . Arthritis   . Atrial fibrillation ([place])    a. Dx 03/2013, notes report atrial fibrillation/atrial flutter, placed on amiodarone. NSR in subsequent OV's.  . B12 deficiency anemia   . Cancer ([place])   . Chest pain    a. H/o CTA negative for PE 2012, normal cath 2005, normal nucs previously including 05/2012.  Marland Kitchen Complete heart block (H[facility])    a. s/p Medtronic Adapta L model ADDRL 1 (serial number [id number]) pacemaker.  . Dementia   . GERD (gastroesophageal reflux disease)   . Hiatal hernia   .  Hyperlipidemia   . Hypertension   . Iron deficiency anemia   . LBBB (left bundle branch block)   . Microcytic anemia   . Nephrolithiasis   . On home oxygen therapy    a. 2L w/CPAP at night  . Orthostatic hypotension   . OSA on CPAP    setting = 4  . Pacemaker   . [place] spotted fever ~ 1945  . Sinus drainage   . Stroke ([place]) 05/2016  . Type II diabetes mellitus ([place])     Past Surgical History:  Procedure Laterality Date  . CARDIAC CATHETERIZATION     by Dr. Acie Fredrickson, January 24, 2004, that shows minimal coronary artery irregularities and normal left ventricular function  . CATARACT EXTRACTION W/ INTRAOCULAR LENS  IMPLANT, BILATERAL Bilateral   . COLONOSCOPY WITH PROPOFOL N/A 05/23/2016   Procedure: COLONOSCOPY WITH PROPOFOL;  Surgeon: Jerene Bears, MD;  Location: [facility];  Service: Gastroenterology;  Laterality: N/A;    . ENTEROSCOPY N/A 06/02/2016   Procedure: ENTEROSCOPY;  Surgeon: Gatha Mayer, MD;  Location: [facility] ENDOSCOPY;  Service: Endoscopy;  Laterality: N/A;  . ESOPHAGOGASTRODUODENOSCOPY N/A 05/22/2016   Procedure: ESOPHAGOGASTRODUODENOSCOPY (EGD);  Surgeon: Jerene Bears, MD;  Location: [facility] ENDOSCOPY;  Service: Endoscopy;  Laterality: N/A;  . ESOPHAGOGASTRODUODENOSCOPY (EGD) WITH PROPOFOL N/A 06/21/2015   Procedure: ESOPHAGOGASTRODUODENOSCOPY (EGD) WITH PROPOFOL;  Surgeon: Gatha Mayer, MD;  Location: [facility] ENDOSCOPY;  Service: Endoscopy;  Laterality: N/A;  . GIVENS CAPSULE STUDY N/A 05/23/2016   Procedure: GIVENS CAPSULE STUDY;  Surgeon: Jerene Bears, MD;  Location: [facility];  Service: Gastroenterology;  Laterality: N/A;  . HOT HEMOSTASIS N/A 06/02/2016   Procedure: HOT HEMOSTASIS (ARGON PLASMA COAGULATION/BICAP);  Surgeon: Gatha Mayer, MD;  Location: [facility] ENDOS[facility];  Service: Endoscopy;  Laterality: N/A;  . INGUINAL HERNIA REPAIR Right   . IR RADIOLOGIST EVAL & MGMT  11/25/2016  . IR RADIOLOGIST EVAL & MGMT  01/22/2017  . IR VERTEBROPLASTY CERV/THOR BX INC UNI/BIL INC/INJECT/IMAGING  01/30/2017  . IR VERTEBROPLASTY EA ADDL (T&LS) BX INC UNI/BIL INC INJECT/IMAGING  11/28/2016  . IR VERTEBROPLASTY EA ADDL (T&LS) BX INC UNI/BIL INC INJECT/IMAGING  01/30/2017  . IR VERTEBROPLASTY LUMBAR BX INC UNI/BIL INC/INJECT/IMAGING  11/28/2016  . LUMBAR DISC SURGERY  ~ 1993  . PERMANENT PACEMAKER INSERTION N/A 06/03/2014   MDT Adapta L implanted by Dr Rayann Heman for syncope and transient AV block  . SKIN CANCER EXCISION     "lower lip" (04/06/2013)  . TEMPORARY PACEMAKER INSERTION N/A 05/31/2014   Procedure: TEMPORARY WIRE;  Surgeon: Sinclair Grooms, MD;  Location: [facility] CATH LAB;  Service: Cardiovascular;  Laterality: N/A;  . VASECTOMY     Hx of      Current Outpatient Medications  Medication Sig Dispense Refill  . acetaminophen (TYLENOL) 500 MG tablet Take 500 mg by mouth 2 (two) times daily.     Marland Kitchen amiodarone (PACERONE)  200 MG tablet Take 200 mg by mouth every Monday, Wednesday, and Friday.     Marland Kitchen aspirin EC 81 MG tablet Take 1 tablet (81 mg total) by mouth daily. 30 tablet 1  . atorvastatin (LIPITOR) 40 MG tablet Take 1 tablet (40 mg total) by mouth daily at 6 PM. 30 tablet 0  . cyanocobalamin (,VITAMIN B-12,) 1000 MCG/ML injection Inject 1,000 mcg into the muscle every 30 (thirty) days.    . diazepam (VALIUM) 5 MG tablet Take 0.5 tablets (2.5 mg total) by mouth at bedtime. 5 tablet 0  .  divalproex (DEPAKOTE) 250 MG DR tablet Take 1 tablet by mouth 2 (two) times daily.    . DULoxetine (CYMBALTA) 60 MG capsule Take 60 mg by mouth every evening.    . ferrous sulfate 325 (65 FE) MG tablet Take 325 mg by mouth daily with breakfast.    . fluticasone (FLONASE) 50 MCG/ACT nasal spray Place 1 spray into the nose daily as needed for allergies or rhinitis.     . furosemide (LASIX) 40 MG tablet Take 1.5 tablets (60 mg total) by mouth 2 (two) times daily. 270 tablet 3  . levothyroxine (SYNTHROID, LEVOTHROID) 25 MCG tablet Take 25 mcg by mouth daily before breakfast.    . lisinopril (PRINIVIL,ZESTRIL) 2.5 MG tablet Take 1 tablet (2.5 mg total) by mouth daily. 30 tablet 0  . metoprolol tartrate (LOPRESSOR) 50 MG tablet Take 50 mg by mouth twice daily 180 tablet 1  . pantoprazole (PROTONIX) 40 MG tablet Take 1 tablet (40 mg total) by mouth daily at 6 (six) AM. 30 tablet 1  . potassium chloride (K-DUR) 10 MEQ tablet Take 2 tablets (20 mEq total) by mouth 2 (two) times daily. 360 tablet 3   No current facility-administered medications for this visit.    Facility-Administered Medications Ordered in Other Visits  Medication Dose Route Frequency Provider Last Rate Last Dose  . 0.9 %  sodium chloride infusion   Intravenous Continuous Monia Sabal, PA-C        Allergies:   Codeine and Citalopram    Social History:  The patient  reports that he has quit smoking. His smoking use included cigarettes. He smoked 0.00 packs per day  for 20.00 years. he has never used smokeless tobacco. He reports that he does not drink alcohol or use drugs.   Family History:  The patient's family history includes Breast cancer in his sister; Other in his unknown relative; Pneumonia in his father; Stroke in his brother; Thyroid disease in his mother.    ROS:  Please see the history of present illness.     All other systems are reviewed and negative.   Physical Exam: Blood pressure (!) 110/58, pulse 66, height 6\' 2"  (1.88 m), weight 192 lb 6.4 oz (87.3 kg), SpO2 93 %.  GEN: Obese elderly gentleman.  Looks to be chronically ill. HEENT: Normal NECK: No JVD; No carotid bruits LYMPHATICS: No lymphadenopathy CARDIAC: Regular rate S1-S2. RESPIRATORY:  Clear to auscultation without rales, wheezing or rhonchi  ABDOMEN: Moderately obese MUSCULOSKELETAL:  No edema; No deformity  SKIN: His skin looks yellowish. NEUROLOGIC: Generally weak.  Has at least mild to moderate dementia.   EKG:      Recent Labs: 05/22/2017: B Natriuretic Peptide 1,188.0; TSH 3.021 05/25/2017: Magnesium 1.8 07/21/2017: ALT 21; BUN 29; Creatinine, Ser 1.45; Hemoglobin 9.7; NT-Pro BNP 13,733; Platelets 266; Potassium 5.1; Sodium 141    Lipid Panel    Component Value Date/Time   CHOL 136 05/06/2016 0531   TRIG 70 05/06/2016 0531   HDL 38 (L) 05/06/2016 0531   CHOLHDL 3.6 05/06/2016 0531   VLDL 14 05/06/2016 0531   LDLCALC 84 05/06/2016 0531      Wt Readings from Last 3 Encounters:  07/30/17 192 lb 6.4 oz (87.3 kg)  07/21/17 199 lb 8 oz (90.5 kg)  05/25/17 212 lb 15.4 oz (96.6 kg)      Other studies Reviewed: Additional studies/ records that were reviewed today include: . Review of the above records demonstrates:    ASSESSMENT AND PLAN:  1.  Chronic diastolic congestive heart failure.  Ms. Blomquist health overall continues to gradually decline.  He is very weak.  He has rales in his lungs which I suspect are more due to atelectasis but certainly he  may have some worsening congestive heart failure. Recent labs show that he is developed some renal insufficiency with a BUN of 51 and creatinine of 1.6.  I think the current Lasix dose of 60 mg twice a day is too much.  We will need to decrease his Lasix to 60 mg once a day with potassium chloride 20 mEq once a day.  We will recheck a basic metabolic profile in 2-3 weeks.  We started the discussion that this might be about as good as he gets.  More that if he declines any further than will need to be placed in a nursing home.  We might eventually need to consider hospice.  2.     Frequent falls.   He spends a lot of time in a wheelchair.  He needs assistance in ambulating. He had a fall this past summer resulting in some head trauma.  3.   Atrial fib-    Not on anticoagulation  3. Essential Hypertension -  .  4. Pacer - followed by Dr. Rayann Heman   5, diabetes Mellitus  - followed by his medical doctor    Current medicines are reviewed at length with the patient today.  The patient does not have concerns regarding medicines.  The following changes have been made:  no change  Labs/ tests ordered today include:  No orders of the defined types were placed in this encounter.    Disposition:   FU with me in 6 months      Mertie Moores, MD  07/30/2017 1:52 PM    [facility] [address], [place], [place]  [address] Phone: [phone]; Fax: [phone]

## 2017-07-30 NOTE — Patient Instructions (Signed)
Medication Instructions:  Your physician has recommended you make the following change in your medication:  DECREASE Lasix (Furosemide) to 60 mg daily DECREASE Kdur (Potassium) to 20 meq daily STOP Lisinopril   Labwork: TODAY - basic metabolic panel, CBC, liver panel  Your physician recommends that you return for lab work in: 2-3 weeks for basic metabolic panel at our office or Dr. Danna Hefty office   Testing/Procedures: None Ordered   Follow-Up: Your physician recommends that you schedule a follow-up appointment in: 3 months with Dr. Acie Fredrickson   If you need a refill on your cardiac medications before your next appointment, please call your pharmacy.   Thank you for choosing [facility]! Christen Bame, RN [phone]

## 2017-07-31 LAB — CBC WITH DIFFERENTIAL/PLATELET
Basophils Absolute: 0 10*3/uL (ref 0.0–0.2)
Basos: 1 %
EOS (ABSOLUTE): 0.5 10*3/uL — ABNORMAL HIGH (ref 0.0–0.4)
EOS: 7 %
HEMATOCRIT: 32.5 % — AB (ref 37.5–51.0)
Hemoglobin: 10.2 g/dL — ABNORMAL LOW (ref 13.0–17.7)
IMMATURE GRANULOCYTES: 0 %
Immature Grans (Abs): 0 10*3/uL (ref 0.0–0.1)
LYMPHS: 19 %
Lymphocytes Absolute: 1.3 10*3/uL (ref 0.7–3.1)
MCH: 28.7 pg (ref 26.6–33.0)
MCHC: 31.4 g/dL — ABNORMAL LOW (ref 31.5–35.7)
MCV: 91 fL (ref 79–97)
MONOCYTES: 12 %
MONOS ABS: 0.8 10*3/uL (ref 0.1–0.9)
NEUTROS PCT: 61 %
Neutrophils Absolute: 4.3 10*3/uL (ref 1.4–7.0)
Platelets: 241 10*3/uL (ref 150–379)
RBC: 3.56 x10E6/uL — AB (ref 4.14–5.80)
RDW: 17.8 % — AB (ref 12.3–15.4)
WBC: 6.9 10*3/uL (ref 3.4–10.8)

## 2017-07-31 LAB — HEPATIC FUNCTION PANEL
ALBUMIN: 3.2 g/dL — AB (ref 3.5–4.7)
ALT: 22 IU/L (ref 0–44)
AST: 34 IU/L (ref 0–40)
Alkaline Phosphatase: 118 IU/L — ABNORMAL HIGH (ref 39–117)
BILIRUBIN TOTAL: 0.3 mg/dL (ref 0.0–1.2)
Bilirubin, Direct: 0.16 mg/dL (ref 0.00–0.40)
Total Protein: 7.2 g/dL (ref 6.0–8.5)

## 2017-07-31 LAB — BASIC METABOLIC PANEL
BUN/Creatinine Ratio: 34 — ABNORMAL HIGH (ref 10–24)
BUN: 53 mg/dL — AB (ref 8–27)
CALCIUM: 8.8 mg/dL (ref 8.6–10.2)
CO2: 29 mmol/L (ref 20–29)
CREATININE: 1.58 mg/dL — AB (ref 0.76–1.27)
Chloride: 98 mmol/L (ref 96–106)
GFR calc Af Amer: 46 mL/min/{1.73_m2} — ABNORMAL LOW (ref >59)
GFR calc non Af Amer: 40 mL/min/{1.73_m2} — ABNORMAL LOW (ref >59)
GLUCOSE: 149 mg/dL — AB (ref 65–99)
Potassium: 5.7 mmol/L — ABNORMAL HIGH (ref 3.5–5.2)
SODIUM: 141 mmol/L (ref 134–144)

## 2017-08-04 LAB — CUP PACEART REMOTE DEVICE CHECK
Battery Remaining Longevity: 141 mo
Battery Voltage: 2.79 V
Brady Statistic AP VP Percent: 1 %
Brady Statistic AS VP Percent: 6 %
Date Time Interrogation Session: 20181121203635
Implantable Lead Implant Date: 20151010
Implantable Lead Model: 5092
Implantable Pulse Generator Implant Date: 20151010
Lead Channel Impedance Value: 449 Ohm
Lead Channel Pacing Threshold Amplitude: 0.625 V
Lead Channel Pacing Threshold Amplitude: 0.625 V
Lead Channel Pacing Threshold Pulse Width: 0.4 ms
Lead Channel Setting Pacing Amplitude: 2 V
MDC IDC LEAD IMPLANT DT: 20151010
MDC IDC LEAD LOCATION: 753859
MDC IDC LEAD LOCATION: 753860
MDC IDC MSMT BATTERY IMPEDANCE: 163 Ohm
MDC IDC MSMT LEADCHNL RV IMPEDANCE VALUE: 569 Ohm
MDC IDC MSMT LEADCHNL RV PACING THRESHOLD PULSEWIDTH: 0.4 ms
MDC IDC SET LEADCHNL RV PACING AMPLITUDE: 2.5 V
MDC IDC SET LEADCHNL RV PACING PULSEWIDTH: 0.4 ms
MDC IDC SET LEADCHNL RV SENSING SENSITIVITY: 5.6 mV
MDC IDC STAT BRADY AP VS PERCENT: 9 %
MDC IDC STAT BRADY AS VS PERCENT: 84 %

## 2017-08-05 ENCOUNTER — Telehealth: Payer: Self-pay | Admitting: Physician Assistant

## 2017-08-05 DIAGNOSIS — R310 Gross hematuria: Secondary | ICD-10-CM | POA: Diagnosis not present

## 2017-08-05 DIAGNOSIS — I509 Heart failure, unspecified: Secondary | ICD-10-CM | POA: Diagnosis not present

## 2017-08-05 DIAGNOSIS — N390 Urinary tract infection, site not specified: Secondary | ICD-10-CM | POA: Diagnosis not present

## 2017-08-05 DIAGNOSIS — Z6824 Body mass index (BMI) 24.0-24.9, adult: Secondary | ICD-10-CM | POA: Diagnosis not present

## 2017-08-05 NOTE — Telephone Encounter (Signed)
Paged by answering service. Patient has passed blood in urine with clots this morning. No other symptoms. Not on anticoagulation. Advised to follow up with PCP.  I will review with Dr. Acie Fredrickson regarding ASA 81mg  qd.

## 2017-08-05 NOTE — Telephone Encounter (Signed)
Agree He needs to discuss with his primary MD

## 2017-08-06 ENCOUNTER — Telehealth: Payer: Self-pay | Admitting: Internal Medicine

## 2017-08-06 DIAGNOSIS — N183 Chronic kidney disease, stage 3 (moderate): Secondary | ICD-10-CM | POA: Diagnosis not present

## 2017-08-06 DIAGNOSIS — E1122 Type 2 diabetes mellitus with diabetic chronic kidney disease: Secondary | ICD-10-CM | POA: Diagnosis not present

## 2017-08-06 DIAGNOSIS — I480 Paroxysmal atrial fibrillation: Secondary | ICD-10-CM | POA: Diagnosis not present

## 2017-08-06 DIAGNOSIS — F0390 Unspecified dementia without behavioral disturbance: Secondary | ICD-10-CM | POA: Diagnosis not present

## 2017-08-06 DIAGNOSIS — I130 Hypertensive heart and chronic kidney disease with heart failure and stage 1 through stage 4 chronic kidney disease, or unspecified chronic kidney disease: Secondary | ICD-10-CM | POA: Diagnosis not present

## 2017-08-06 DIAGNOSIS — I5033 Acute on chronic diastolic (congestive) heart failure: Secondary | ICD-10-CM | POA: Diagnosis not present

## 2017-08-06 NOTE — Telephone Encounter (Signed)
Per Dr End have pt go back to 60 mg twice daily on Furosemide and come in next week and see app.No appts available with app .Per pt's wife has appt on Wednesday with Dr Dagmar Hait  and labs are to be done at that time to check kidney's  Will forward to Dr Acie Fredrickson for review and recommendations ./cy

## 2017-08-06 NOTE — Telephone Encounter (Signed)
Spoke with pt's wife and pt has swelling ,sob with walking ,and 5 lb weight gain . Per pt's wife  Pt had recent labs done at Dr Avva's office awaiting results to get most recent kidney values Lasix was just reduced at last office visit  Based on abnormal readings ./cy

## 2017-08-06 NOTE — Telephone Encounter (Signed)
Will have Dr. Dagmar Hait assess the patient and draw labs.  I will be happy to discuss the case with Dr. Dagmar Hait on Wednesday

## 2017-08-06 NOTE — Telephone Encounter (Signed)
New Message    Pt c/o swelling: STAT is pt has developed SOB within 24 hours  1) How much weight have you gained and in what time span? In a 3 day time span the pt has gain 5lbs If swelling, where is the swelling located? Some swelling in both ankles  2) Are you currently taking a fluid pill? lasik 60mg   3) Are you currently SOB? Yes when he walks 4) Do you have a log of your daily weights (if so, list)?12/11 197lbs 12/13 202lbs  5)   Have you gained 3 pounds in a day or 5 pounds in a week? Yes 5 in 3days        Have you traveled recently? No

## 2017-08-07 ENCOUNTER — Ambulatory Visit: Payer: Medicare Other | Admitting: Cardiovascular Disease

## 2017-08-11 DIAGNOSIS — N183 Chronic kidney disease, stage 3 (moderate): Secondary | ICD-10-CM | POA: Diagnosis not present

## 2017-08-11 DIAGNOSIS — I130 Hypertensive heart and chronic kidney disease with heart failure and stage 1 through stage 4 chronic kidney disease, or unspecified chronic kidney disease: Secondary | ICD-10-CM | POA: Diagnosis not present

## 2017-08-11 DIAGNOSIS — F0390 Unspecified dementia without behavioral disturbance: Secondary | ICD-10-CM | POA: Diagnosis not present

## 2017-08-11 DIAGNOSIS — E1122 Type 2 diabetes mellitus with diabetic chronic kidney disease: Secondary | ICD-10-CM | POA: Diagnosis not present

## 2017-08-11 DIAGNOSIS — I480 Paroxysmal atrial fibrillation: Secondary | ICD-10-CM | POA: Diagnosis not present

## 2017-08-11 DIAGNOSIS — I5033 Acute on chronic diastolic (congestive) heart failure: Secondary | ICD-10-CM | POA: Diagnosis not present

## 2017-08-12 ENCOUNTER — Inpatient Hospital Stay (HOSPITAL_COMMUNITY)
Admission: EM | Admit: 2017-08-12 | Discharge: 2017-08-15 | DRG: 312 | Disposition: A | Discharge status: Home Health | Payer: Medicare Other | Attending: Family Medicine | Admitting: Family Medicine

## 2017-08-12 ENCOUNTER — Ambulatory Visit (HOSPITAL_COMMUNITY): Admit: 2017-08-12 | Payer: Medicare Other | Admitting: Cardiovascular Disease

## 2017-08-12 ENCOUNTER — Encounter (HOSPITAL_COMMUNITY): Payer: Self-pay | Admitting: Emergency Medicine

## 2017-08-12 ENCOUNTER — Emergency Department (HOSPITAL_COMMUNITY): Payer: Medicare Other

## 2017-08-12 ENCOUNTER — Encounter (HOSPITAL_COMMUNITY): Payer: Self-pay

## 2017-08-12 ENCOUNTER — Other Ambulatory Visit: Payer: Self-pay

## 2017-08-12 DIAGNOSIS — Z961 Presence of intraocular lens: Secondary | ICD-10-CM | POA: Diagnosis present

## 2017-08-12 DIAGNOSIS — R55 Syncope and collapse: Principal | ICD-10-CM | POA: Diagnosis present

## 2017-08-12 DIAGNOSIS — N183 Chronic kidney disease, stage 3 (moderate): Secondary | ICD-10-CM | POA: Diagnosis present

## 2017-08-12 DIAGNOSIS — I509 Heart failure, unspecified: Secondary | ICD-10-CM | POA: Diagnosis not present

## 2017-08-12 DIAGNOSIS — R001 Bradycardia, unspecified: Secondary | ICD-10-CM | POA: Diagnosis present

## 2017-08-12 DIAGNOSIS — Z9841 Cataract extraction status, right eye: Secondary | ICD-10-CM

## 2017-08-12 DIAGNOSIS — F419 Anxiety disorder, unspecified: Secondary | ICD-10-CM | POA: Diagnosis present

## 2017-08-12 DIAGNOSIS — I951 Orthostatic hypotension: Secondary | ICD-10-CM | POA: Diagnosis present

## 2017-08-12 DIAGNOSIS — E785 Hyperlipidemia, unspecified: Secondary | ICD-10-CM | POA: Diagnosis present

## 2017-08-12 DIAGNOSIS — J9601 Acute respiratory failure with hypoxia: Secondary | ICD-10-CM | POA: Diagnosis not present

## 2017-08-12 DIAGNOSIS — Z9842 Cataract extraction status, left eye: Secondary | ICD-10-CM

## 2017-08-12 DIAGNOSIS — K558 Other vascular disorders of intestine: Secondary | ICD-10-CM | POA: Diagnosis not present

## 2017-08-12 DIAGNOSIS — Z79899 Other long term (current) drug therapy: Secondary | ICD-10-CM

## 2017-08-12 DIAGNOSIS — Z87891 Personal history of nicotine dependence: Secondary | ICD-10-CM

## 2017-08-12 DIAGNOSIS — E860 Dehydration: Secondary | ICD-10-CM | POA: Diagnosis present

## 2017-08-12 DIAGNOSIS — I130 Hypertensive heart and chronic kidney disease with heart failure and stage 1 through stage 4 chronic kidney disease, or unspecified chronic kidney disease: Secondary | ICD-10-CM | POA: Diagnosis present

## 2017-08-12 DIAGNOSIS — R296 Repeated falls: Secondary | ICD-10-CM | POA: Diagnosis not present

## 2017-08-12 DIAGNOSIS — E1122 Type 2 diabetes mellitus with diabetic chronic kidney disease: Secondary | ICD-10-CM | POA: Diagnosis present

## 2017-08-12 DIAGNOSIS — D509 Iron deficiency anemia, unspecified: Secondary | ICD-10-CM | POA: Diagnosis present

## 2017-08-12 DIAGNOSIS — F0390 Unspecified dementia without behavioral disturbance: Secondary | ICD-10-CM | POA: Diagnosis not present

## 2017-08-12 DIAGNOSIS — E038 Other specified hypothyroidism: Secondary | ICD-10-CM | POA: Diagnosis not present

## 2017-08-12 DIAGNOSIS — D631 Anemia in chronic kidney disease: Secondary | ICD-10-CM | POA: Diagnosis present

## 2017-08-12 DIAGNOSIS — R0901 Asphyxia: Secondary | ICD-10-CM | POA: Diagnosis not present

## 2017-08-12 DIAGNOSIS — Z950 Presence of cardiac pacemaker: Secondary | ICD-10-CM | POA: Diagnosis not present

## 2017-08-12 DIAGNOSIS — I69398 Other sequelae of cerebral infarction: Secondary | ICD-10-CM

## 2017-08-12 DIAGNOSIS — I480 Paroxysmal atrial fibrillation: Secondary | ICD-10-CM | POA: Diagnosis not present

## 2017-08-12 DIAGNOSIS — Z7982 Long term (current) use of aspirin: Secondary | ICD-10-CM

## 2017-08-12 DIAGNOSIS — Z8673 Personal history of transient ischemic attack (TIA), and cerebral infarction without residual deficits: Secondary | ICD-10-CM

## 2017-08-12 DIAGNOSIS — R269 Unspecified abnormalities of gait and mobility: Secondary | ICD-10-CM

## 2017-08-12 DIAGNOSIS — K5520 Angiodysplasia of colon without hemorrhage: Secondary | ICD-10-CM | POA: Diagnosis present

## 2017-08-12 DIAGNOSIS — Z85828 Personal history of other malignant neoplasm of skin: Secondary | ICD-10-CM

## 2017-08-12 DIAGNOSIS — D649 Anemia, unspecified: Secondary | ICD-10-CM | POA: Diagnosis present

## 2017-08-12 DIAGNOSIS — Z9981 Dependence on supplemental oxygen: Secondary | ICD-10-CM

## 2017-08-12 DIAGNOSIS — N189 Chronic kidney disease, unspecified: Secondary | ICD-10-CM | POA: Diagnosis not present

## 2017-08-12 DIAGNOSIS — I4891 Unspecified atrial fibrillation: Secondary | ICD-10-CM | POA: Diagnosis present

## 2017-08-12 DIAGNOSIS — R310 Gross hematuria: Secondary | ICD-10-CM | POA: Diagnosis not present

## 2017-08-12 DIAGNOSIS — I5032 Chronic diastolic (congestive) heart failure: Secondary | ICD-10-CM | POA: Diagnosis present

## 2017-08-12 DIAGNOSIS — N179 Acute kidney failure, unspecified: Secondary | ICD-10-CM | POA: Diagnosis not present

## 2017-08-12 DIAGNOSIS — M1990 Unspecified osteoarthritis, unspecified site: Secondary | ICD-10-CM | POA: Diagnosis present

## 2017-08-12 DIAGNOSIS — G4733 Obstructive sleep apnea (adult) (pediatric): Secondary | ICD-10-CM | POA: Diagnosis present

## 2017-08-12 DIAGNOSIS — R0602 Shortness of breath: Secondary | ICD-10-CM | POA: Diagnosis not present

## 2017-08-12 DIAGNOSIS — R079 Chest pain, unspecified: Secondary | ICD-10-CM | POA: Diagnosis not present

## 2017-08-12 DIAGNOSIS — R4189 Other symptoms and signs involving cognitive functions and awareness: Secondary | ICD-10-CM | POA: Diagnosis present

## 2017-08-12 DIAGNOSIS — R42 Dizziness and giddiness: Secondary | ICD-10-CM | POA: Diagnosis not present

## 2017-08-12 DIAGNOSIS — R404 Transient alteration of awareness: Secondary | ICD-10-CM | POA: Diagnosis not present

## 2017-08-12 LAB — CBC
HCT: 24.9 % — ABNORMAL LOW (ref 39.0–52.0)
Hemoglobin: 7.8 g/dL — ABNORMAL LOW (ref 13.0–17.0)
MCH: 29.3 pg (ref 26.0–34.0)
MCHC: 31.3 g/dL (ref 30.0–36.0)
MCV: 93.6 fL (ref 78.0–100.0)
PLATELETS: 190 10*3/uL (ref 150–400)
RBC: 2.66 MIL/uL — AB (ref 4.22–5.81)
RDW: 18.7 % — AB (ref 11.5–15.5)
WBC: 5.8 10*3/uL (ref 4.0–10.5)

## 2017-08-12 LAB — I-STAT CHEM 8, ED
BUN: 44 mg/dL — ABNORMAL HIGH (ref 6–20)
CALCIUM ION: 1.16 mmol/L (ref 1.15–1.40)
CHLORIDE: 101 mmol/L (ref 101–111)
Creatinine, Ser: 2.4 mg/dL — ABNORMAL HIGH (ref 0.61–1.24)
GLUCOSE: 108 mg/dL — AB (ref 65–99)
HCT: 24 % — ABNORMAL LOW (ref 39.0–52.0)
HEMOGLOBIN: 8.2 g/dL — AB (ref 13.0–17.0)
Potassium: 5.2 mmol/L — ABNORMAL HIGH (ref 3.5–5.1)
SODIUM: 138 mmol/L (ref 135–145)
TCO2: 28 mmol/L (ref 22–32)

## 2017-08-12 LAB — RETICULOCYTES
RBC.: 2.45 MIL/uL — ABNORMAL LOW (ref 4.22–5.81)
Retic Count, Absolute: 66.2 10*3/uL (ref 19.0–186.0)
Retic Ct Pct: 2.7 % (ref 0.4–3.1)

## 2017-08-12 LAB — TROPONIN I: TROPONIN I: 0.03 ng/mL — AB (ref <0.03)

## 2017-08-12 LAB — BRAIN NATRIURETIC PEPTIDE: B NATRIURETIC PEPTIDE 5: 392 pg/mL — AB (ref 0.0–100.0)

## 2017-08-12 LAB — PREPARE RBC (CROSSMATCH)

## 2017-08-12 SURGERY — LEFT HEART CATH AND CORONARY ANGIOGRAPHY
Anesthesia: LOCAL

## 2017-08-12 MED ORDER — DIAZEPAM 5 MG PO TABS
2.5000 mg | ORAL_TABLET | Freq: Every day | ORAL | Status: DC
  Administered 2017-08-12 – 2017-08-14 (×3): 2.5 mg via ORAL
  Filled 2017-08-12 (×3): qty 1

## 2017-08-12 MED ORDER — CYANOCOBALAMIN 1000 MCG/ML IJ SOLN: 1000.0000 ug | INTRAMUSCULAR | Status: DC

## 2017-08-12 MED ORDER — SULFAMETHOXAZOLE-TRIMETHOPRIM 800-160 MG PO TABS
1.0000 | ORAL_TABLET | Freq: Two times a day (BID) | ORAL | Status: DC
  Administered 2017-08-12: 1 via ORAL
  Filled 2017-08-12 (×2): qty 1

## 2017-08-12 MED ORDER — FERROUS SULFATE 325 (65 FE) MG PO TABS
325.0000 mg | ORAL_TABLET | Freq: Every day | ORAL | Status: DC
  Administered 2017-08-13 – 2017-08-15 (×3): 325 mg via ORAL
  Filled 2017-08-12 (×3): qty 1

## 2017-08-12 MED ORDER — LEVOTHYROXINE SODIUM 25 MCG PO TABS
25.0000 ug | ORAL_TABLET | Freq: Every day | ORAL | Status: DC
  Administered 2017-08-13 – 2017-08-15 (×3): 25 ug via ORAL
  Filled 2017-08-12 (×3): qty 1

## 2017-08-12 MED ORDER — ATORVASTATIN CALCIUM 40 MG PO TABS
40.0000 mg | ORAL_TABLET | Freq: Every day | ORAL | Status: DC
  Administered 2017-08-13 – 2017-08-14 (×2): 40 mg via ORAL
  Filled 2017-08-12 (×2): qty 1

## 2017-08-12 MED ORDER — SODIUM CHLORIDE 0.9 % IV SOLN
Freq: Once | INTRAVENOUS | Status: AC
  Administered 2017-08-13: 01:00:00 via INTRAVENOUS

## 2017-08-12 MED ORDER — SODIUM CHLORIDE 0.9 % IV SOLN: 250.0000 mL | INTRAVENOUS | Status: DC | PRN

## 2017-08-12 MED ORDER — SODIUM CHLORIDE 0.9% FLUSH: 3.0000 mL | INTRAVENOUS | Status: DC | PRN

## 2017-08-12 MED ORDER — SODIUM CHLORIDE 0.9 % IV BOLUS (SEPSIS)
500.0000 mL | Freq: Once | INTRAVENOUS | Status: AC
  Administered 2017-08-12: 500 mL via INTRAVENOUS

## 2017-08-12 MED ORDER — POTASSIUM CHLORIDE ER 10 MEQ PO TBCR: 20.0000 meq | EXTENDED_RELEASE_TABLET | Freq: Every day | ORAL | Status: DC

## 2017-08-12 MED ORDER — ACETAMINOPHEN 500 MG PO TABS
500.0000 mg | ORAL_TABLET | Freq: Two times a day (BID) | ORAL | Status: DC
  Administered 2017-08-12 – 2017-08-15 (×6): 500 mg via ORAL
  Filled 2017-08-12 (×6): qty 1

## 2017-08-12 MED ORDER — SODIUM CHLORIDE 0.9% FLUSH
3.0000 mL | Freq: Two times a day (BID) | INTRAVENOUS | Status: DC
  Administered 2017-08-12 – 2017-08-15 (×5): 3 mL via INTRAVENOUS

## 2017-08-12 MED ORDER — PANTOPRAZOLE SODIUM 40 MG PO TBEC
40.0000 mg | DELAYED_RELEASE_TABLET | Freq: Every day | ORAL | Status: DC
  Administered 2017-08-13 – 2017-08-15 (×3): 40 mg via ORAL
  Filled 2017-08-12 (×3): qty 1

## 2017-08-12 MED ORDER — DIVALPROEX SODIUM 250 MG PO DR TAB
250.0000 mg | DELAYED_RELEASE_TABLET | Freq: Two times a day (BID) | ORAL | Status: DC
  Administered 2017-08-12 – 2017-08-15 (×6): 250 mg via ORAL
  Filled 2017-08-12 (×6): qty 1

## 2017-08-12 MED ORDER — AMIODARONE HCL 200 MG PO TABS
200.0000 mg | ORAL_TABLET | ORAL | Status: DC
  Administered 2017-08-14: 200 mg via ORAL
  Filled 2017-08-12: qty 1

## 2017-08-12 MED ORDER — ASPIRIN EC 81 MG PO TBEC
81.0000 mg | DELAYED_RELEASE_TABLET | Freq: Every day | ORAL | Status: DC
  Administered 2017-08-13 – 2017-08-15 (×3): 81 mg via ORAL
  Filled 2017-08-12 (×3): qty 1

## 2017-08-12 MED ORDER — DULOXETINE HCL 60 MG PO CPEP
60.0000 mg | ORAL_CAPSULE | Freq: Every evening | ORAL | Status: DC
  Administered 2017-08-12 – 2017-08-14 (×3): 60 mg via ORAL
  Filled 2017-08-12 (×3): qty 1

## 2017-08-12 NOTE — Progress Notes (Signed)
This was a page for code stemi. He was an 83 year old male caucasian. No family present . Patient very talkative and appreciative of chaplain's visit. Patient passed on to the night chaplain from chaplain's office after missing each other through lifts. Chaplain provided compassionate presence.  [place].

## 2017-08-12 NOTE — ED Notes (Addendum)
Pt unable to complete orthostatic vitals. Upon standing, pt began to list to the left, and unable to remain standing. This EMT assisted pt back sitting. RN notified.

## 2017-08-12 NOTE — ED Notes (Signed)
Attempted Report 

## 2017-08-12 NOTE — ED Provider Notes (Signed)
[facility] Provider Note   CSN: [id number] Arrival date & time: 08/12/17  1650     History   Chief Complaint Chief Complaint  Patient presents with  . Loss of Consciousness    HPI Richard [name] is a 83 y.o. male.  HPI  83 year old male with history of A. fib not on anticoagulation, CHF, sick sinus syndrome status post pacemaker placement comes in from primary care doctor visit with chief complaint of syncope.  Patient reports that over the past 2 or 3 weeks he has had increased amount of shortness of breath with exertion.  Patient denies any associated chest pain.  Patient also does not have any orthopnea-like symptoms.  Patient does indicate that lately he has been having some dizziness when he gets up.  Patient has history of orthostatics, and today while at the primary care doctor's clinic, when patient was asked to get up he got dizzy and fainted.  Family also reports that patient has had increased falls over the past few days.  Clinic note indicates that patient's heart rate dropped in the 40s and O2 sats dropped in the 80s prior to him fainting.  Past Medical History:  Diagnosis Date  . Anxiety   . Arthritis   . Atrial fibrillation ([place])    a. Dx 03/2013, notes report atrial fibrillation/atrial flutter, placed on amiodarone. NSR in subsequent OV's.  . B12 deficiency anemia   . Cancer ([place])   . Chest pain    a. H/o CTA negative for PE 2012, normal cath 2005, normal nucs previously including 05/2012.  Marland Kitchen Complete heart block (HCC)    a. s/p Medtronic Adapta L model ADDRL 1 (serial number [id number]) pacemaker.  . Dementia   . GERD (gastroesophageal reflux disease)   . Hiatal hernia   . Hyperlipidemia   . Hypertension   . Iron deficiency anemia   . LBBB (left bundle branch block)   . Microcytic anemia   . Nephrolithiasis   . On home oxygen therapy    a. 2L w/CPAP at night  . Orthostatic hypotension   . OSA on CPAP    setting = 4    . Pacemaker   . [place] spotted fever ~ 1945  . Sinus drainage   . Stroke ([place]) 05/2016  . Type II diabetes mellitus [facility])     Patient Active Problem List   Diagnosis Date Noted  . Acute on chronic diastolic CHF (congestive heart failure) ([place]) 05/22/2017  . CKD (chronic kidney disease) stage 3, GFR 30-59 ml/min (HCC) 05/22/2017  . Acute respiratory failure with hypoxia ([place]) 05/22/2017  . Intracranial hemorrhage ([place]) 03/03/2017  . Head trauma 03/02/2017  . Chronic diastolic CHF (congestive heart failure) ([place]) 11/16/2016  . AVM (arteriovenous malformation) of small bowel, acquired ([place])   . Abnormality of gait as late effect of stroke 05/27/2016  . Benign neoplasm of cecum   . Benign neoplasm of sigmoid colon   . GI bleeding 05/22/2016  . Melena   . GIB (gastrointestinal bleeding) 05/21/2016  . Diabetes mellitus with complication ([place])   . Upper GI bleed   . CKD (chronic kidney disease)   . Type 2 diabetes mellitus with circulatory disorder, without long-term current use of insulin ([place])   . Acute right PCA stroke ([place]) 05/08/2016  . Ataxia due to recent stroke   . Gait disturbance, post-stroke   . Cerebellar stroke, acute ([place]) 05/07/2016  . Hyperlipidemia   . Chronic anticoagulation   .  Cardiac pacemaker in situ   . Acute blood loss anemia   . Cognitive deficits   . Cognitive deficit due to recent stroke   . Stroke-related cognitive dysfunction   . Generalized weakness 05/05/2016  . Dizziness   . Absolute anemia 02/08/2016  . Weakness 02/01/2016  . Gastric polyp 06/21/2015  . Iron deficiency anemia due to chronic blood loss   . GERD (gastroesophageal reflux disease)   . B12 deficiency anemia   . Bradycardia with less than 30 beats per minute 05/31/2014  . Complete heart block ([place]) 05/31/2014  . Syncope 05/31/2014  . Anemia 05/31/2014  . Atrial fibrillation ([place]) 04/08/2013  . Diabetes mellitus ([place]) 04/06/2013  . Hiatal hernia 04/06/2013  . Essential  hypertension 06/13/2011  . OSA (obstructive sleep apnea) 06/13/2011    Past Surgical History:  Procedure Laterality Date  . CARDIAC CATHETERIZATION     by Dr. Acie Fredrickson, January 24, 2004, that shows minimal coronary artery irregularities and normal left ventricular function  . CATARACT EXTRACTION W/ INTRAOCULAR LENS  IMPLANT, BILATERAL Bilateral   . COLONOSCOPY WITH PROPOFOL N/A 05/23/2016   Procedure: COLONOSCOPY WITH PROPOFOL;  Surgeon: Jerene Bears, MD;  Location: [facility];  Service: Gastroenterology;  Laterality: N/A;  . ENTEROSCOPY N/A 06/02/2016   Procedure: ENTEROSCOPY;  Surgeon: Gatha Mayer, MD;  Location: [facility] ENDOSCOPY;  Service: Endoscopy;  Laterality: N/A;  . ESOPHAGOGASTRODUODENOSCOPY N/A 05/22/2016   Procedure: ESOPHAGOGASTRODUODENOSCOPY (EGD);  Surgeon: Jerene Bears, MD;  Location: [facility] ENDOSCOPY;  Service: Endoscopy;  Laterality: N/A;  . ESOPHAGOGASTRODUODENOSCOPY (EGD) WITH PROPOFOL N/A 06/21/2015   Procedure: ESOPHAGOGASTRODUODENOSCOPY (EGD) WITH PROPOFOL;  Surgeon: Gatha Mayer, MD;  Location: [facility] ENDOSCOPY;  Service: Endoscopy;  Laterality: N/A;  . GIVENS CAPSULE STUDY N/A 05/23/2016   Procedure: GIVENS CAPSULE STUDY;  Surgeon: Jerene Bears, MD;  Location: [facility];  Service: Gastroenterology;  Laterality: N/A;  . HOT HEMOSTASIS N/A 06/02/2016   Procedure: HOT HEMOSTASIS (ARGON PLASMA COAGULATION/BICAP);  Surgeon: Gatha Mayer, MD;  Location: [facility];  Service: Endoscopy;  Laterality: N/A;  . INGUINAL HERNIA REPAIR Right   . IR RADIOLOGIST EVAL & MGMT  11/25/2016  . IR RADIOLOGIST EVAL & MGMT  01/22/2017  . IR VERTEBROPLASTY CERV/THOR BX INC UNI/BIL INC/INJECT/IMAGING  01/30/2017  . IR VERTEBROPLASTY EA ADDL (T&LS) BX INC UNI/BIL INC INJECT/IMAGING  11/28/2016  . IR VERTEBROPLASTY EA ADDL (T&LS) BX INC UNI/BIL INC INJECT/IMAGING  01/30/2017  . IR VERTEBROPLASTY LUMBAR BX INC UNI/BIL INC/INJECT/IMAGING  11/28/2016  . LUMBAR DISC SURGERY  ~ 1993  . PERMANENT PACEMAKER INSERTION  N/A 06/03/2014   MDT Adapta L implanted by Dr Rayann Heman for syncope and transient AV block  . SKIN CANCER EXCISION     "lower lip" (04/06/2013)  . TEMPORARY PACEMAKER INSERTION N/A 05/31/2014   Procedure: TEMPORARY WIRE;  Surgeon: Sinclair Grooms, MD;  Location: [facility] CATH LAB;  Service: Cardiovascular;  Laterality: N/A;  . VASECTOMY     Hx of     OB History    No data available       Home Medications    Prior to Admission medications   Medication Sig Start Date End Date Taking? Authorizing Provider  acetaminophen (TYLENOL) 500 MG tablet Take 500 mg by mouth 2 (two) times daily.    Yes [provider]  amiodarone (PACERONE) 200 MG tablet Take 200 mg by mouth every Monday, Wednesday, and Friday.    Yes [provider]  aspirin EC 81 MG tablet Take 1  tablet (81 mg total) by mouth daily. 05/26/17  Yes Tat, Shanon Brow, MD  atorvastatin (LIPITOR) 40 MG tablet Take 1 tablet (40 mg total) by mouth daily at 6 PM. 05/16/16  Yes Love, Ivan Anchors, PA-C  cyanocobalamin (,VITAMIN B-12,) 1000 MCG/ML injection Inject 1,000 mcg into the muscle every 30 (thirty) days.   Yes [provider]  diazepam (VALIUM) 5 MG tablet Take 0.5 tablets (2.5 mg total) by mouth at bedtime. 05/25/17  Yes Tat, Shanon Brow, MD  divalproex (DEPAKOTE) 250 MG DR tablet Take 1 tablet by mouth 2 (two) times daily. 05/13/17  Yes [provider]  DULoxetine (CYMBALTA) 60 MG capsule Take 60 mg by mouth every evening.   Yes [provider]  ferrous sulfate 325 (65 FE) MG tablet Take 325 mg by mouth daily with breakfast.   Yes [provider]  fluticasone (FLONASE) 50 MCG/ACT nasal spray Place 1 spray into the nose daily as needed for allergies or rhinitis.    Yes [provider]  furosemide (LASIX) 40 MG tablet Take 1.5 tablets (60 mg total) by mouth daily. 07/30/17 10/28/17 Yes Nahser, Wonda Cheng, MD  levothyroxine (SYNTHROID, LEVOTHROID) 25 MCG tablet Take 25 mcg by mouth daily before breakfast.    Yes [provider]  metoprolol tartrate (LOPRESSOR) 50 MG tablet Take 50 mg by mouth twice daily 07/06/17  Yes Nahser, Wonda Cheng, MD  pantoprazole (PROTONIX) 40 MG tablet Take 1 tablet (40 mg total) by mouth daily at 6 (six) AM. 05/25/16  Yes Kelvin Cellar, MD  potassium chloride (K-DUR) 10 MEQ tablet Take 2 tablets (20 mEq total) by mouth daily. 07/30/17 10/28/17 Yes Nahser, Wonda Cheng, MD  sulfamethoxazole-trimethoprim (BACTRIM DS,SEPTRA DS) 800-160 MG tablet Take 1 tablet by mouth 2 (two) times daily. Started 08/05/17 for 10 days 08/05/17  Yes [provider]    Family History Family History  Problem Relation Age of Onset  . Thyroid disease Mother        goiter  . Pneumonia Father   . Other Unknown        Parents both died of old age; other conditions not known  . Stroke Brother   . Breast cancer Sister     Social History Social History   Tobacco Use  . Smoking status: Former Smoker    Packs/day: 0.00    Years: 20.00    Pack years: 0.00    Types: Cigarettes  . Smokeless tobacco: Never Used  . Tobacco comment: 04/06/2013 "quit smoking years ago; didn't smoke that much; probably 20 years; 1ppd"  Substance Use Topics  . Alcohol use: No  . Drug use: No     Allergies   Codeine and Citalopram   Review of Systems Review of Systems  Constitutional: Positive for activity change.  Respiratory: Positive for shortness of breath.   Cardiovascular: Negative for chest pain.  Gastrointestinal: Negative for nausea.  Skin: Positive for rash.  Neurological: Positive for syncope.  Hematological: Does not bruise/bleed easily.  All other systems reviewed and are negative.    Physical Exam Updated Vital Signs BP (!) 151/67   Pulse 68   Temp 97.9 F (36.6 C) (Oral)   Resp 18   Ht 6\' 3"  (1.905 m)   Wt 90.1 kg (198 lb 10.2 oz)   SpO2 99%   BMI 24.83 kg/m   Physical Exam  Constitutional: He is oriented to person, place, and time. He appears well-developed.    HENT:  Head: Atraumatic.  Neck: Neck supple.  Cardiovascular: Normal rate and regular rhythm.  Pulmonary/Chest: Effort normal. He has rales.  Bibasilar rales, right worse than left  Musculoskeletal: He exhibits no edema or tenderness.  Neurological: He is alert and oriented to person, place, and time.  Skin: Skin is warm.  Nursing note and vitals reviewed.    ED Treatments / Results  Labs (all labs ordered are listed, but only abnormal results are displayed) Labs Reviewed  CBC - Abnormal; Notable for the following components:      Result Value   RBC 2.66 (*)    Hemoglobin 7.8 (*)    HCT 24.9 (*)    RDW 18.7 (*)    All other components within normal limits  BRAIN NATRIURETIC PEPTIDE - Abnormal; Notable for the following components:   B Natriuretic Peptide 392.0 (*)    All other components within normal limits  TROPONIN I - Abnormal; Notable for the following components:   Troponin I 0.03 (*)    All other components within normal limits  IRON AND TIBC - Abnormal; Notable for the following components:   Iron 33 (*)    Saturation Ratios 12 (*)    All other components within normal limits  RETICULOCYTES - Abnormal; Notable for the following components:   RBC. 2.45 (*)    All other components within normal limits  I-STAT CHEM 8, ED - Abnormal; Notable for the following components:   Potassium 5.2 (*)    BUN 44 (*)    Creatinine, Ser 2.40 (*)    Glucose, Bld 108 (*)    Hemoglobin 8.2 (*)    HCT 24.0 (*)    All other components within normal limits  VITAMIN B12  FOLATE  FERRITIN  TROPONIN I  TROPONIN I  TROPONIN I  BASIC METABOLIC PANEL  CBC  TYPE AND SCREEN  PREPARE RBC (CROSSMATCH)    EKG  EKG Interpretation  Date/Time:  Wednesday August 12 2017 17:00:24 EST Ventricular Rate:  60 PR Interval:    QRS Duration: 177 QT Interval:  512 QTC Calculation: 512 R Axis:   -18 Text Interpretation:  Sinus rhythm Left bundle branch block No acute changes No  significant change since last tracing Confirmed by Varney Biles ([id number]) on 08/12/2017 5:31:42 PM       Radiology Dg Chest Port 1 View  Result Date: 08/12/2017 CLINICAL DATA:  Shortness of breath with exertion. Ongoing for 2-3 weeks. No chest pain. EXAM: PORTABLE CHEST 1 VIEW COMPARISON:  07/21/2017 FINDINGS: There is mild bilateral chronic interstitial thickening. There is no focal parenchymal opacity. There is no pleural effusion or pneumothorax. There is stable cardiomegaly. There is a dual lead cardiac pacemaker. The osseous structures are unremarkable. IMPRESSION: No active disease. Electronically Signed   By: Kathreen Devoid   On: 08/12/2017 17:48    Procedures Procedures (including critical care time)  Medications Ordered in ED Medications  acetaminophen (TYLENOL) tablet 500 mg (500 mg Oral Given 08/12/17 2354)  cyanocobalamin ((VITAMIN B-12)) injection 1,000 mcg (not administered)  atorvastatin (LIPITOR) tablet 40 mg (not administered)  pantoprazole (PROTONIX) EC tablet 40 mg (not administered)  amiodarone (PACERONE) tablet 200 mg (not administered)  levothyroxine (SYNTHROID, LEVOTHROID) tablet 25 mcg (not administered)  DULoxetine (CYMBALTA) DR capsule 60 mg (60 mg Oral Given 08/12/17 2354)  ferrous sulfate tablet 325 mg (not administered)  divalproex (DEPAKOTE) DR tablet 250 mg (250 mg Oral Given 08/12/17 2354)  aspirin EC tablet 81 mg (not administered)  diazepam (VALIUM) tablet 2.5 mg (2.5 mg Oral Given 08/12/17  2354)  sulfamethoxazole-trimethoprim (BACTRIM DS,SEPTRA DS) 800-160 MG per tablet 1 tablet (1 tablet Oral Given 08/12/17 2354)  0.9 %  sodium chloride infusion (not administered)  sodium chloride flush (NS) 0.9 % injection 3 mL (3 mLs Intravenous Given 08/12/17 2355)  sodium chloride flush (NS) 0.9 % injection 3 mL (not administered)  0.9 %  sodium chloride infusion (not administered)  sodium chloride 0.9 % bolus 500 mL (0 mLs Intravenous Stopped 08/12/17 2129)      Initial Impression / Assessment and Plan / ED Course  I have reviewed the triage vital signs and the nursing notes.  Pertinent labs & imaging results that were available during my care of the patient were reviewed by me and considered in my medical decision making (see chart for details).     DDx includes: Orthostatic hypotension Stroke Vertebral artery dissection/stenosis Dysrhythmia PE Vasovagal/neurocardiogenic syncope Aortic stenosis Valvular disorder/Cardiomyopathy Anemia  Patient comes in with chief complaint of syncope.  It appears that he has history of orthostatic hypotension, and the syncopal episode was preceded by patient getting up.  Therefore likely the syncope was orthostatic in nature.  However, patient reports that he has had increased dyspnea on exertion over the past few days and has advanced heart failure -so we will need to do a CHF workup.  Patient might need admission to the hospital for optimization.  Pacemaker interrogation will be initiated.    Final Clinical Impressions(s) / ED Diagnoses   Final diagnoses:  Syncope and collapse    ED Discharge Orders    None       Varney Biles, MD 08/13/17 [phone]

## 2017-08-12 NOTE — ED Notes (Signed)
Pt denies syncopal episodes, st's he felt like he was going to pass out when he stood up but sat back down before he did.  Pt has had increased shortness of breath over past 2 weeks.  Pt denies any chest pain at this time.  Wife at bedside

## 2017-08-12 NOTE — H&P (Signed)
History and Physical    DEWARREN LEDBETTER EAV:[id number] DOB: 03/11/1931 DOA: 08/12/2017  PCP: Prince Solian, MD  Patient coming from:  pcp office  Chief Complaint:  Passed out  HPI: Richard [name] is a 83 y.o. male with medical history significant of CHF, CKD, afib, pacemaker went to follow up with his pcp today for several exacerbations of his chf .   His lasix has been decreased then increased again due to issues with both his kidneys and heart.  When his lasix was decreased he started to swell, so it was then increased again per wife over a week ago.  The swelling has improved.  Over the last week and half he has passed out several times at home.  When he gets up to move around he gets weak and dizzy and passes out.  No fevers.  No n/v/d.  He has no peripheral swelling now.  Today he went to see his PCP and he got up to get on the weight scale and he got dizzy and passed out at the office.  His HR went down to the 30s, his bp went down and his oxygen sats went down.  Pt feels fine now.  Denies any chest pain.  No sob.  No recent fevers.  Referred for admission for his syncope.  Review of Systems: As per HPI otherwise 10 point review of systems negative.   Past Medical History:  Diagnosis Date  . Anxiety   . Arthritis   . Atrial fibrillation ([place])    a. Dx 03/2013, notes report atrial fibrillation/atrial flutter, placed on amiodarone. NSR in subsequent OV's.  . B12 deficiency anemia   . Cancer ([place])   . Chest pain    a. H/o CTA negative for PE 2012, normal cath 2005, normal nucs previously including 05/2012.  Marland Kitchen Complete heart block (HCC)    a. s/p Medtronic Adapta L model ADDRL 1 (serial number [id number]) pacemaker.  . Dementia   . GERD (gastroesophageal reflux disease)   . Hiatal hernia   . Hyperlipidemia   . Hypertension   . Iron deficiency anemia   . LBBB (left bundle branch block)   . Microcytic anemia   . Nephrolithiasis   . On home oxygen therapy    a. 2L w/CPAP at  night  . Orthostatic hypotension   . OSA on CPAP    setting = 4  . Pacemaker   . [place] spotted fever ~ 1945  . Sinus drainage   . Stroke ([place]) 05/2016  . Type II diabetes mellitus ([place])     Past Surgical History:  Procedure Laterality Date  . CARDIAC CATHETERIZATION     by Dr. Acie Fredrickson, January 24, 2004, that shows minimal coronary artery irregularities and normal left ventricular function  . CATARACT EXTRACTION W/ INTRAOCULAR LENS  IMPLANT, BILATERAL Bilateral   . COLONOSCOPY WITH PROPOFOL N/A 05/23/2016   Procedure: COLONOSCOPY WITH PROPOFOL;  Surgeon: Jerene Bears, MD;  Location: [place];  Service: Gastroenterology;  Laterality: N/A;  . ENTEROSCOPY N/A 06/02/2016   Procedure: ENTEROSCOPY;  Surgeon: Gatha Mayer, MD;  Location: [facility] ENDOSCOPY;  Service: Endoscopy;  Laterality: N/A;  . ESOPHAGOGASTRODUODENOSCOPY N/A 05/22/2016   Procedure: ESOPHAGOGASTRODUODENOSCOPY (EGD);  Surgeon: Jerene Bears, MD;  Location: [facility] ENDOSCOPY;  Service: Endoscopy;  Laterality: N/A;  . ESOPHAGOGASTRODUODENOSCOPY (EGD) WITH PROPOFOL N/A 06/21/2015   Procedure: ESOPHAGOGASTRODUODENOSCOPY (EGD) WITH PROPOFOL;  Surgeon: Gatha Mayer, MD;  Location: [facility] ENDOSCOPY;  Service: Endoscopy;  Laterality: N/A;  .  GIVENS CAPSULE STUDY N/A 05/23/2016   Procedure: GIVENS CAPSULE STUDY;  Surgeon: Jerene Bears, MD;  Location: [place];  Service: Gastroenterology;  Laterality: N/A;  . HOT HEMOSTASIS N/A 06/02/2016   Procedure: HOT HEMOSTASIS (ARGON PLASMA COAGULATION/BICAP);  Surgeon: Gatha Mayer, MD;  Location: [facility];  Service: Endoscopy;  Laterality: N/A;  . INGUINAL HERNIA REPAIR Right   . IR RADIOLOGIST EVAL & MGMT  11/25/2016  . IR RADIOLOGIST EVAL & MGMT  01/22/2017  . IR VERTEBROPLASTY CERV/THOR BX INC UNI/BIL INC/INJECT/IMAGING  01/30/2017  . IR VERTEBROPLASTY EA ADDL (T&LS) BX INC UNI/BIL INC INJECT/IMAGING  11/28/2016  . IR VERTEBROPLASTY EA ADDL (T&LS) BX INC UNI/BIL INC INJECT/IMAGING  01/30/2017  .  IR VERTEBROPLASTY LUMBAR BX INC UNI/BIL INC/INJECT/IMAGING  11/28/2016  . LUMBAR DISC SURGERY  ~ 1993  . PERMANENT PACEMAKER INSERTION N/A 06/03/2014   MDT Adapta L implanted by Dr Rayann Heman for syncope and transient AV block  . SKIN CANCER EXCISION     "lower lip" (04/06/2013)  . TEMPORARY PACEMAKER INSERTION N/A 05/31/2014   Procedure: TEMPORARY WIRE;  Surgeon: Sinclair Grooms, MD;  Location: [facility] CATH LAB;  Service: Cardiovascular;  Laterality: N/A;  . VASECTOMY     Hx of      reports that he has quit smoking. His smoking use included cigarettes. He smoked 0.00 packs per day for 20.00 years. he has never used smokeless tobacco. He reports that he does not drink alcohol or use drugs.  Allergies  Allergen Reactions  . Codeine Other (See Comments)    Hallucinations   . Citalopram Swelling and Rash    Family History  Problem Relation Age of Onset  . Thyroid disease Mother        goiter  . Pneumonia Father   . Other Unknown        Parents both died of old age; other conditions not known  . Stroke Brother   . Breast cancer Sister     Prior to Admission medications   Medication Sig Start Date End Date Taking? Authorizing Provider  acetaminophen (TYLENOL) 500 MG tablet Take 500 mg by mouth 2 (two) times daily.    Yes [provider]  amiodarone (PACERONE) 200 MG tablet Take 200 mg by mouth every Monday, Wednesday, and Friday.    Yes [provider]  aspirin EC 81 MG tablet Take 1 tablet (81 mg total) by mouth daily. 05/26/17  Yes Tat, Shanon Brow, MD  atorvastatin (LIPITOR) 40 MG tablet Take 1 tablet (40 mg total) by mouth daily at 6 PM. 05/16/16  Yes Love, Ivan Anchors, PA-C  cyanocobalamin (,VITAMIN B-12,) 1000 MCG/ML injection Inject 1,000 mcg into the muscle every 30 (thirty) days.   Yes [provider]  diazepam (VALIUM) 5 MG tablet Take 0.5 tablets (2.5 mg total) by mouth at bedtime. 05/25/17  Yes Tat, Shanon Brow, MD  divalproex (DEPAKOTE) 250 MG DR tablet Take 1 tablet by  mouth 2 (two) times daily. 05/13/17  Yes [provider]  DULoxetine (CYMBALTA) 60 MG capsule Take 60 mg by mouth every evening.   Yes [provider]  ferrous sulfate 325 (65 FE) MG tablet Take 325 mg by mouth daily with breakfast.   Yes [provider]  fluticasone (FLONASE) 50 MCG/ACT nasal spray Place 1 spray into the nose daily as needed for allergies or rhinitis.    Yes [provider]  furosemide (LASIX) 40 MG tablet Take 1.5 tablets (60 mg total) by mouth daily. 07/30/17  10/28/17 Yes Nahser, Wonda Cheng, MD  levothyroxine (SYNTHROID, LEVOTHROID) 25 MCG tablet Take 25 mcg by mouth daily before breakfast.   Yes [provider]  metoprolol tartrate (LOPRESSOR) 50 MG tablet Take 50 mg by mouth twice daily 07/06/17  Yes Nahser, Wonda Cheng, MD  pantoprazole (PROTONIX) 40 MG tablet Take 1 tablet (40 mg total) by mouth daily at 6 (six) AM. 05/25/16  Yes Kelvin Cellar, MD  potassium chloride (K-DUR) 10 MEQ tablet Take 2 tablets (20 mEq total) by mouth daily. 07/30/17 10/28/17 Yes Nahser, Wonda Cheng, MD  sulfamethoxazole-trimethoprim (BACTRIM DS,SEPTRA DS) 800-160 MG tablet Take 1 tablet by mouth 2 (two) times daily. Started 08/05/17 for 10 days 08/05/17  Yes [provider]    Physical Exam: Vitals:   08/12/17 1657 08/12/17 1658  BP:  127/66  Pulse:  60  Resp:  18  Temp:  (!) 97.2 F (36.2 C)  TempSrc:  Temporal  SpO2:  98%  Height: 6\' 2"  (1.88 m)       Constitutional: NAD, calm, comfortable Vitals:   08/12/17 1657 08/12/17 1658  BP:  127/66  Pulse:  60  Resp:  18  Temp:  (!) 97.2 F (36.2 C)  TempSrc:  Temporal  SpO2:  98%  Height: 6\' 2"  (1.88 m)    Eyes: PERRL, lids and conjunctivae normal ENMT: Mucous membranes are moist. Posterior pharynx clear of any exudate or lesions.Normal dentition.  Neck: normal, supple, no masses, no thyromegaly Respiratory: clear to auscultation bilaterally, no wheezing, no crackles. Normal respiratory  effort. No accessory muscle use.  Cardiovascular: Regular rate and rhythm, no murmurs / rubs / gallops. No extremity edema. 2+ pedal pulses. No carotid bruits.  Abdomen: no tenderness, no masses palpated. No hepatosplenomegaly. Bowel sounds positive.  Musculoskeletal: no clubbing / cyanosis. No joint deformity upper and lower extremities. Good ROM, no contractures. Normal muscle tone.  Skin: no rashes, lesions, ulcers. No induration Neurologic: CN 2-12 grossly intact. Sensation intact, DTR normal. Strength 5/5 in all 4.  Psychiatric: Normal judgment and insight. Alert and oriented x 3. Normal mood.    Labs on Admission: I have personally reviewed following labs and imaging studies  CBC: Recent Labs  Lab 08/12/17 1737 08/12/17 1800  WBC 5.8  --   HGB 7.8* 8.2*  HCT 24.9* 24.0*  MCV 93.6  --   PLT 190  --    Basic Metabolic Panel: Recent Labs  Lab 08/12/17 1800  NA 138  K 5.2*  CL 101  GLUCOSE 108*  BUN 44*  CREATININE 2.40*   GFR: Estimated Creatinine Clearance: 26.6 mL/min (A) (by C-G formula based on SCr of 2.4 mg/dL (H)). Liver Function Tests: No results for input(s): AST, ALT, ALKPHOS, BILITOT, PROT, ALBUMIN in the last 168 hours. No results for input(s): LIPASE, AMYLASE in the last 168 hours. No results for input(s): AMMONIA in the last 168 hours. Coagulation Profile: No results for input(s): INR, PROTIME in the last 168 hours. Cardiac Enzymes: Recent Labs  Lab 08/12/17 1737  TROPONINI 0.03*   BNP (last 3 results) Recent Labs    07/21/17 1235  PROBNP 13,733*   HbA1C: No results for input(s): HGBA1C in the last 72 hours. CBG: No results for input(s): GLUCAP in the last 168 hours. Lipid Profile: No results for input(s): CHOL, HDL, LDLCALC, TRIG, CHOLHDL, LDLDIRECT in the last 72 hours. Thyroid Function Tests: No results for input(s): TSH, T4TOTAL, FREET4, T3FREE, THYROIDAB in the last 72 hours. Anemia Panel: No results for input(s): VITAMINB12,  FOLATE,  FERRITIN, TIBC, IRON, RETICCTPCT in the last 72 hours. Urine analysis:    Component Value Date/Time   COLORURINE COLORLESS (A) 05/22/2017 1130   APPEARANCEUR CLEAR 05/22/2017 1130   LABSPEC 1.005 05/22/2017 1130   PHURINE 6.0 05/22/2017 1130   GLUCOSEU NEGATIVE 05/22/2017 1130   HGBUR SMALL (A) 05/22/2017 1130   BILIRUBINUR NEGATIVE 05/22/2017 1130   KETONESUR NEGATIVE 05/22/2017 1130   PROTEINUR NEGATIVE 05/22/2017 1130   UROBILINOGEN 0.2 03/30/2014 1400   NITRITE NEGATIVE 05/22/2017 1130   LEUKOCYTESUR NEGATIVE 05/22/2017 1130   Sepsis Labs: !!!!!!!!!!!!!!!!!!!!!!!!!!!!!!!!!!!!!!!!!!!! @LABRCNTIP (procalcitonin:4,lacticidven:4) )No results found for this or any previous visit (from the past 240 hour(s)).   Radiological Exams on Admission: Dg Chest Port 1 View  Result Date: 08/12/2017 CLINICAL DATA:  Shortness of breath with exertion. Ongoing for 2-3 weeks. No chest pain. EXAM: PORTABLE CHEST 1 VIEW COMPARISON:  07/21/2017 FINDINGS: There is mild bilateral chronic interstitial thickening. There is no focal parenchymal opacity. There is no pleural effusion or pneumothorax. There is stable cardiomegaly. There is a dual lead cardiac pacemaker. The osseous structures are unremarkable. IMPRESSION: No active disease. Electronically Signed   By: Kathreen Devoid   On: 08/12/2017 17:48    EKG: Independently reviewed.  LBBB Old chart reviewed Case discussed with edp  Assessment/Plan 83 yo male with syncopal episodes c/w orthostatic syncope  Principal Problem:   Syncope- pt sounds like he is orthostatic and was in PCP office today.  Has a drop in his hbg to 7.8, was over 10 last month.  Denies any bleeding but has h/o bleeding and has been off anticoagulatation for this reason.  Ck cardiac echo.  Heme stools.  Transfuse 1 unit of prbc.  Hold lasix and bblocker for now.  Sounds like his fluid status is very tenuous, will transfuse prbc and not give a lot of ivf for now.  No focal neurological  deficits.  Active Problems:   Atrial fibrillation ([place])- nsr now   Bradycardia with less than 30 beats per minute- this is resolved   Anemia- transfuse one unit.  Ck anemia panel.  Ck heme stool   Cardiac pacemaker in situ- noted   Cognitive deficits- noted, seems mild   CKD (chronic kidney disease)- little bump up to 2.4 (1.8)   Abnormality of gait as late effect of stroke- noted   AVM (arteriovenous malformation) of small bowel, acquired ([place])- noted   Chronic diastolic CHF (congestive heart failure) ([place])- stable , holding lasix and bblocker due to above    DVT prophylaxis:  scds Code Status:  full Family Communication:  wife Disposition Plan:  Per day team Consults called:  none Admission status:  observation   Richard [name] A MD [facility]  If 7PM-7AM, please contact night-coverage [email or web address] Password [facility]  08/12/2017, 7:27 PM

## 2017-08-12 NOTE — ED Notes (Signed)
Dr. Shanon Brow in to assess pt at this time for admission.  Trop results called from lab, Dr. Kathrynn Humble made aware

## 2017-08-12 NOTE — ED Triage Notes (Signed)
Per EMS, pt sent from [facility], for syncope and abnormal EKG. Pt feels lightheaded upon standing, has had multiple falls at home. EMS vitals: BP: 128/66, HR-66, CBG-133. Dr. [place] at the bedside.

## 2017-08-13 ENCOUNTER — Observation Stay (HOSPITAL_COMMUNITY): Payer: Medicare Other

## 2017-08-13 ENCOUNTER — Inpatient Hospital Stay (HOSPITAL_COMMUNITY): Payer: Medicare Other

## 2017-08-13 DIAGNOSIS — I130 Hypertensive heart and chronic kidney disease with heart failure and stage 1 through stage 4 chronic kidney disease, or unspecified chronic kidney disease: Secondary | ICD-10-CM | POA: Diagnosis present

## 2017-08-13 DIAGNOSIS — D649 Anemia, unspecified: Secondary | ICD-10-CM | POA: Diagnosis not present

## 2017-08-13 DIAGNOSIS — R55 Syncope and collapse: Secondary | ICD-10-CM

## 2017-08-13 DIAGNOSIS — E860 Dehydration: Secondary | ICD-10-CM | POA: Diagnosis present

## 2017-08-13 DIAGNOSIS — Z9981 Dependence on supplemental oxygen: Secondary | ICD-10-CM | POA: Diagnosis not present

## 2017-08-13 DIAGNOSIS — Z7982 Long term (current) use of aspirin: Secondary | ICD-10-CM | POA: Diagnosis not present

## 2017-08-13 DIAGNOSIS — N183 Chronic kidney disease, stage 3 (moderate): Secondary | ICD-10-CM | POA: Diagnosis present

## 2017-08-13 DIAGNOSIS — Z9841 Cataract extraction status, right eye: Secondary | ICD-10-CM | POA: Diagnosis not present

## 2017-08-13 DIAGNOSIS — F419 Anxiety disorder, unspecified: Secondary | ICD-10-CM | POA: Diagnosis present

## 2017-08-13 DIAGNOSIS — Z85828 Personal history of other malignant neoplasm of skin: Secondary | ICD-10-CM | POA: Diagnosis not present

## 2017-08-13 DIAGNOSIS — I480 Paroxysmal atrial fibrillation: Secondary | ICD-10-CM | POA: Diagnosis not present

## 2017-08-13 DIAGNOSIS — N179 Acute kidney failure, unspecified: Secondary | ICD-10-CM | POA: Diagnosis present

## 2017-08-13 DIAGNOSIS — D509 Iron deficiency anemia, unspecified: Secondary | ICD-10-CM | POA: Diagnosis not present

## 2017-08-13 DIAGNOSIS — G4733 Obstructive sleep apnea (adult) (pediatric): Secondary | ICD-10-CM | POA: Diagnosis present

## 2017-08-13 DIAGNOSIS — Z950 Presence of cardiac pacemaker: Secondary | ICD-10-CM | POA: Diagnosis not present

## 2017-08-13 DIAGNOSIS — F0390 Unspecified dementia without behavioral disturbance: Secondary | ICD-10-CM | POA: Diagnosis present

## 2017-08-13 DIAGNOSIS — Z961 Presence of intraocular lens: Secondary | ICD-10-CM | POA: Diagnosis present

## 2017-08-13 DIAGNOSIS — M1990 Unspecified osteoarthritis, unspecified site: Secondary | ICD-10-CM | POA: Diagnosis present

## 2017-08-13 DIAGNOSIS — Z8673 Personal history of transient ischemic attack (TIA), and cerebral infarction without residual deficits: Secondary | ICD-10-CM | POA: Diagnosis not present

## 2017-08-13 DIAGNOSIS — Z79899 Other long term (current) drug therapy: Secondary | ICD-10-CM | POA: Diagnosis not present

## 2017-08-13 DIAGNOSIS — E785 Hyperlipidemia, unspecified: Secondary | ICD-10-CM | POA: Diagnosis present

## 2017-08-13 DIAGNOSIS — Z87891 Personal history of nicotine dependence: Secondary | ICD-10-CM | POA: Diagnosis not present

## 2017-08-13 DIAGNOSIS — I5032 Chronic diastolic (congestive) heart failure: Secondary | ICD-10-CM | POA: Diagnosis not present

## 2017-08-13 DIAGNOSIS — Z9842 Cataract extraction status, left eye: Secondary | ICD-10-CM | POA: Diagnosis not present

## 2017-08-13 DIAGNOSIS — E1122 Type 2 diabetes mellitus with diabetic chronic kidney disease: Secondary | ICD-10-CM | POA: Diagnosis present

## 2017-08-13 LAB — BASIC METABOLIC PANEL
ANION GAP: 9 (ref 5–15)
BUN: 45 mg/dL — AB (ref 6–20)
CHLORIDE: 104 mmol/L (ref 101–111)
CO2: 25 mmol/L (ref 22–32)
Calcium: 8.2 mg/dL — ABNORMAL LOW (ref 8.9–10.3)
Creatinine, Ser: 2.45 mg/dL — ABNORMAL HIGH (ref 0.61–1.24)
GFR calc Af Amer: 26 mL/min — ABNORMAL LOW (ref >60)
GFR calc non Af Amer: 23 mL/min — ABNORMAL LOW (ref >60)
GLUCOSE: 95 mg/dL (ref 65–99)
POTASSIUM: 4.8 mmol/L (ref 3.5–5.1)
SODIUM: 138 mmol/L (ref 135–145)

## 2017-08-13 LAB — TROPONIN I
Troponin I: <0.03 ng/mL (ref <0.03)
Troponin I: <0.03 ng/mL (ref <0.03)
Troponin I: <0.03 ng/mL (ref <0.03)

## 2017-08-13 LAB — ECHOCARDIOGRAM COMPLETE
Height: 75 in
Weight: 3195.79 oz

## 2017-08-13 LAB — VITAMIN B12: Vitamin B-12: 522 pg/mL (ref 180–914)

## 2017-08-13 LAB — CBC
HCT: 26.6 % — ABNORMAL LOW (ref 39.0–52.0)
HEMOGLOBIN: 8.3 g/dL — AB (ref 13.0–17.0)
MCH: 27.6 pg (ref 26.0–34.0)
MCHC: 31.2 g/dL (ref 30.0–36.0)
MCV: 88.4 fL (ref 78.0–100.0)
Platelets: 126 10*3/uL — ABNORMAL LOW (ref 150–400)
RBC: 3.01 MIL/uL — ABNORMAL LOW (ref 4.22–5.81)
RDW: 20.8 % — AB (ref 11.5–15.5)
WBC: 4.6 10*3/uL (ref 4.0–10.5)

## 2017-08-13 LAB — FERRITIN: FERRITIN: 162 ng/mL (ref 24–336)

## 2017-08-13 LAB — IRON AND TIBC
IRON: 33 ug/dL — AB (ref 45–182)
Saturation Ratios: 12 % — ABNORMAL LOW (ref 17.9–39.5)
TIBC: 283 ug/dL (ref 250–450)
UIBC: 250 ug/dL

## 2017-08-13 LAB — FOLATE: Folate: 15.9 ng/mL (ref >5.9)

## 2017-08-13 MED ORDER — SODIUM CHLORIDE 0.9 % IV SOLN
INTRAVENOUS | Status: DC
  Administered 2017-08-13 – 2017-08-14 (×2): via INTRAVENOUS

## 2017-08-13 NOTE — Progress Notes (Signed)
Echocardiogram 2D Echocardiogram has been performed.  Richard [name] 08/13/2017, 3:02 PM

## 2017-08-13 NOTE — Consult Note (Signed)
   [facility] CM Inpatient Consult   08/13/2017  BECKY BERBERIAN [birth year] [id number]  Patient screened for high risk for re-admission in [place] [facility].  Patient is in the [place] of the [place] Management services under patient's Medicare plan.  Met with the patient at the bedside and caregiver at the bedside. Patient with but not exclusive to HX of HF exacerbation, Atrial Fib, Stoke, Chronic Kidney disease.  Patient is currently awaiting testing. Patient gave permission to speak in front of caregiver about [place] Management services.  Introduced patient to benefits of [facility] Care Management.  Patient states he has had home health care in the past.  He is on oxygen and uses a CPAP at night, uses [facility]. Currently feels like his needs are met.  Patient accepted a brochure and states, "I will let my wife look over this and she will see if I need anything."  Please place a [facility] Care Management consult or for questions contact:   Natividad Brood, RN BSN [facility] Liaison  [phone] business mobile phone Toll free office [phone]

## 2017-08-13 NOTE — Progress Notes (Signed)
[facility] PROGRESS NOTE  Richard [name] FYB:[id number] DOB: 01-29-1931 DOA: 08/12/2017 PCP: Prince Solian, MD  Brief summary   83 y.o. male with medical history significant of DM, HTN, Stroke, dCHF, CKD, afib (not on anticoagulation due to age/bleeding) pacemaker went to follow up with his pcp today for several exacerbations of his chf .   His lasix has been decreased then increased again due to issues with both his kidneys and heart.  When his lasix was decreased he started to swell, so it was then increased again per wife over a week ago.  The swelling has improved.  Over the last week and half he has passed out several times at home.  When he gets up to move around he gets weak and dizzy and passes out.  No fevers.  No n/v/d.  He has no peripheral swelling now.  Today he went to see his PCP and he got up to get on the weight scale and he got dizzy and passed out at the office.  His HR went down to the 30s, his bp went down and his oxygen sats went down.  Pt feels fine now.  Denies any chest pain.  No sob.  No recent fevers.  Referred for admission for his syncope.    Assessment/Plan:  Syncope. Unclear etiology. Questionable due to over diuresis/hypovolemia vs cardiogenic (reported bradycardia). No orthostatics available at this time. Neuro exam in non focal. BP is stable  -will obtain orthostatics, obtain pacemaker interrogation,  Pend echo., obtain carotid US. Obtain pt eval.  AKI. On CKD II. Likely over diuresed. Admission creatinine 2.4. Previously around 1.2-1.3. Give 0.5 liter gentle iv fluid. Recheck labs AM. Hold bactrim   Anemia. No s/s acute bleeding. AoCD/renal. Tfsed 1 unit. Recheck labs AM. Started iron   afib (not on anticoagulation due to age/bleeding) pacemaker. Reported bradycardia. Consulted for pacemaker interrogation ordered   Chronic dHF. Recent exacerbation, received increased lasix dose as outpatient. Currently, no s/s of fluid overload. Will hold lasix  today, restart AM   Code Status: full Family Communication: d/w patient, his wife. RN (indicate person spoken with, relationship, and if by phone, the number) Disposition Plan: pend improvement, work up.    Consultants:  none  Procedures:  Pend echo  Antibiotics:  none (indicate start date, and stop date if known)  HPI/Subjective: Alert. Mild confusion. No focal symptoms. Afebrile. No new syncope overnight   Objective: Vitals:   08/13/17 0106 08/13/17 0431  BP: (!) 151/62 (!) 150/63  Pulse: 71 73  Resp: 18 18  Temp: 98.1 F (36.7 C) (!) 97.4 F (36.3 C)  SpO2: 100% 99%    Intake/Output Summary (Last 24 hours) at 08/13/2017 0904 Last data filed at 08/13/2017 0850 Gross per 24 hour  Intake 935 ml  Output 701 ml  Net 234 ml   Filed Weights   08/12/17 2149 08/13/17 0438  Weight: 90.1 kg (198 lb 10.2 oz) 90.6 kg (199 lb 11.8 oz)    Exam:   General:  No distress, alert   Cardiovascular: s1,s2 rrr  Respiratory: no wheezing. Few crackles LL  Abdomen: soft, nt, nd   Musculoskeletal: no leg edema    Data Reviewed: Basic Metabolic Panel: Recent Labs  Lab 08/12/17 1800 08/13/17 0500  NA 138 138  K 5.2* 4.8  CL 101 104  CO2  --  25  GLUCOSE 108* 95  BUN 44* 45*  CREATININE 2.40* 2.45*  CALCIUM  --  8.2*   Liver Function Tests:  No results for input(s): AST, ALT, ALKPHOS, BILITOT, PROT, ALBUMIN in the last 168 hours. No results for input(s): LIPASE, AMYLASE in the last 168 hours. No results for input(s): AMMONIA in the last 168 hours. CBC: Recent Labs  Lab 08/12/17 1737 08/12/17 1800 08/13/17 0500  WBC 5.8  --  4.6  HGB 7.8* 8.2* 8.3*  HCT 24.9* 24.0* 26.6*  MCV 93.6  --  88.4  PLT 190  --  126*   Cardiac Enzymes: Recent Labs  Lab 08/12/17 1737 08/12/17 2302 08/13/17 0500  TROPONINI 0.03* <0.03 <0.03   BNP (last 3 results) Recent Labs    05/22/17 1130 08/12/17 1741  BNP 1,188.0* 392.0*    ProBNP (last 3 results) Recent Labs     07/21/17 1235  PROBNP 13,733*    CBG: No results for input(s): GLUCAP in the last 168 hours.  No results found for this or any previous visit (from the past 240 hour(s)).   Studies: Dg Chest Port 1 View  Result Date: 08/12/2017 CLINICAL DATA:  Shortness of breath with exertion. Ongoing for 2-3 weeks. No chest pain. EXAM: PORTABLE CHEST 1 VIEW COMPARISON:  07/21/2017 FINDINGS: There is mild bilateral chronic interstitial thickening. There is no focal parenchymal opacity. There is no pleural effusion or pneumothorax. There is stable cardiomegaly. There is a dual lead cardiac pacemaker. The osseous structures are unremarkable. IMPRESSION: No active disease. Electronically Signed   By: Kathreen Devoid   On: 08/12/2017 17:48    Scheduled Meds: . acetaminophen  500 mg Oral BID  . [START ON 08/14/2017] amiodarone  200 mg Oral Q M,W,F  . aspirin EC  81 mg Oral Daily  . atorvastatin  40 mg Oral q1800  . [START ON 08/25/2017] cyanocobalamin  1,000 mcg Intramuscular Q30 days  . diazepam  2.5 mg Oral QHS  . divalproex  250 mg Oral BID  . DULoxetine  60 mg Oral QPM  . ferrous sulfate  325 mg Oral Q breakfast  . levothyroxine  25 mcg Oral QAC breakfast  . pantoprazole  40 mg Oral Q0600  . sodium chloride flush  3 mL Intravenous Q12H  . sulfamethoxazole-trimethoprim  1 tablet Oral BID   Continuous Infusions: . sodium chloride      Principal Problem:   Syncope Active Problems:   Atrial fibrillation (HCC)   Bradycardia with less than 30 beats per minute   Anemia   Cardiac pacemaker in situ   Cognitive deficits   CKD (chronic kidney disease)   Abnormality of gait as late effect of stroke   AVM (arteriovenous malformation) of small bowel, acquired ([place])   Chronic diastolic CHF (congestive heart failure) ([place])    Time spent: >35 minutes     Kinnie Feil  [facility] Pager [phone]. If 7PM-7AM, please contact night-coverage at [email or web address], password [facility] 08/13/2017,  9:04 AM  LOS: 0 days

## 2017-08-13 NOTE — Progress Notes (Signed)
Carotid duplex prelim: 1-39% ICA stenosis.  Dorna Mallet Eunice, RDMS, RVT   

## 2017-08-13 NOTE — Plan of Care (Signed)
Patient stable, did have significant drop in blood pressure with sitting and standing.  Patient pleasantly confused, wants to go home.  Was able to stand up and walk with walker with nurse however did become very weak with standing for any period of time.  Wife at bedside.

## 2017-08-13 NOTE — Evaluation (Signed)
Physical Therapy Evaluation Patient Details Name: KIYON FIDALGO MRN: [id number] DOB: [birth year] Today's Date: 08/13/2017   History of Present Illness  Pt is an 83 y.o. male with PMH significant for CHF, CKD, a-fib, pacemaker referred by PCP for admission due to syncope as pt has reportedly passed out several times at home the past week; found to have low Hgb and transfused 1 unit prbc. Other PMH includes OSA on CPAP, CVA, DM, GERD, anxiety, arthritis. Of note, admitted 2 months ago for worsening weakness and DOE.    Clinical Impression  Pt presents with history of falls, generalized weakness, decreased activity tolerance, and an overall decrease in functional mobility secondary to above. PTA, pt amb with RW, requiring assist from wife for mobility and ADLs; recently finished working with HHPT/OT since last hospital admission. Today, pt stood and transferred to chair with RN, requiring min guard but no physical assist. After this transfer, pt declining further mobility evaluation with PT despite max encouragement. Pt is ready to return home. Recommend continued HHPT/OT services to maximize independence upon d/c, as he is currently dependent on wife for assist; pt may refuse these services. Will continue to follow acutely to address established goals.     Follow Up Recommendations Home health PT;Supervision/Assistance - 24 hour    Equipment Recommendations  None recommended by PT    Recommendations for Other Services OT consult     Precautions / Restrictions Precautions Precautions: Fall Restrictions Weight Bearing Restrictions: No      Mobility  Bed Mobility Overal bed mobility: Needs Assistance Bed Mobility: Supine to Sit     Supine to sit: HOB elevated;Min guard        Transfers Overall transfer level: Needs assistance Equipment used: None Transfers: Sit to/from [employer] Transfers Sit to Stand: Min guard Stand pivot transfers: Min guard           Ambulation/Gait             General Gait Details: Pt declining amb beyond transfer to chair  Stairs            Wheelchair Mobility    Modified Rankin (Stroke Patients Only)       Balance Overall balance assessment: Needs assistance Sitting-balance support: No upper extremity supported Sitting balance-Leahy Scale: Fair     Standing balance support: No upper extremity supported Standing balance-Leahy Scale: Fair                               Pertinent Vitals/Pain Pain Assessment: No/denies pain    Home Living Family/patient expects to be discharged to:: Private residence Living Arrangements: Spouse/significant other Available Help at Discharge: Family;Available 24 hours/day Type of Home: House Home Access: Ramped entrance     Home Layout: One level Home Equipment: Walker - 2 wheels;Cane - single point;Bedside commode;Hospital bed;Shower seat;Wheelchair - power      Prior Function Level of Independence: Needs assistance   Gait / Transfers Assistance Needed: Ambulates with RW and spouse assisting  ADL's / Homemaking Assistance Needed: assisted by spouse for all ADLs. Has home aide come 2 days/wk to assist as well        Hand Dominance        Extremity/Trunk Assessment   Upper Extremity Assessment Upper Extremity Assessment: Generalized weakness    Lower Extremity Assessment Lower Extremity Assessment: Generalized weakness       Communication   Communication: HOH  Cognition Arousal/Alertness: Awake/alert Behavior During  Therapy: WFL for tasks assessed/performed Overall Cognitive Status: History of cognitive impairments - at baseline                                        General Comments General comments (skin integrity, edema, etc.): Wife present during session    Exercises     Assessment/Plan    PT Assessment Patient needs continued PT services  PT Problem List Decreased strength;Decreased activity  tolerance;Decreased balance;Decreased mobility;Decreased knowledge of use of DME;Cardiopulmonary status limiting activity       PT Treatment Interventions DME instruction;Gait training;Stair training;Functional mobility training;Therapeutic activities;Therapeutic exercise;Balance training;Patient/family education    PT Goals (Current goals can be found in the Care Plan section)  Acute Rehab PT Goals Patient Stated Goal: Return home PT Goal Formulation: With patient Time For Goal Achievement: 08/27/17 Potential to Achieve Goals: Good    Frequency Min 3X/week   Barriers to discharge        Co-evaluation               AM-PAC PT "6 Clicks" Daily Activity  Outcome Measure Difficulty turning over in bed (including adjusting bedclothes, sheets and blankets)?: A Little Difficulty moving from lying on back to sitting on the side of the bed? : A Little Difficulty sitting down on and standing up from a chair with arms (e.g., wheelchair, bedside commode, etc,.)?: A Little Help needed moving to and from a bed to chair (including a wheelchair)?: A Little Help needed walking in hospital room?: A Little Help needed climbing 3-5 steps with a railing? : A Lot 6 Click Score: 17    End of Session   Activity Tolerance: Patient tolerated treatment well;Other (comment)(Limited by not wanting to amb) Patient left: in chair;with call bell/phone within reach;with family/visitor present Nurse Communication: Mobility status PT Visit Diagnosis: Other abnormalities of gait and mobility (R26.89);History of falling (Z91.81)    Time: 1025-8527 PT Time Calculation (min) (ACUTE ONLY): 23 min   Charges:   PT Evaluation $PT Eval Moderate Complexity: 1 Mod PT Treatments $Self Care/Home Management: 8-22   PT G Codes:   PT G-Codes **NOT FOR INPATIENT CLASS** Functional Assessment Tool Used: AM-PAC 6 Clicks Basic Mobility Functional Limitation: Mobility: Walking and moving around Mobility: Walking  and Moving Around Current Status (P8242): At least 40 percent but less than 60 percent impaired, limited or restricted Mobility: Walking and Moving Around Goal Status [phone]): At least 1 percent but less than 20 percent impaired, limited or restricted   Mabeline Caras, PT, DPT Acute Rehab Services  Pager: [place] 08/13/2017, 9:08 AM

## 2017-08-13 NOTE — Progress Notes (Signed)
Initial Nutrition Assessment  DOCUMENTATION CODES:   Not applicable  INTERVENTION:   -Glucerna Shake po TID, each supplement provides 220 kcal and 10 grams of protein  NUTRITION DIAGNOSIS:   Inadequate oral intake related to decreased appetite as evidenced by meal completion < 50%.  GOAL:   Patient will meet greater than or equal to 90% of their needs  MONITOR:   PO intake, Supplement acceptance, Labs, Weight trends, Skin, I & O's  REASON FOR ASSESSMENT:   Malnutrition Screening Tool    ASSESSMENT:   Richard [name] is a 83 y.o. male with medical history significant of CHF, CKD, afib, pacemaker went to follow up with his pcp today for several exacerbations of his chf .   Pt admitted with syncopal episode consistent with orthostatic syncope.   Pt unavailable at times of visits. Pt in with multiple members of healthcare team. Unable to complete Nutrition-Focused physical exam at this time.   Chart reviewed. Noted that pt has experienced a 14.2% wt loss over the past 4 months, which is significant for time frame.   Noted fair appetite; meal completion 50%. Pt would benefit from addition of supplements to promote nutritional adequacy.   Labs reviewed: Glucose: 108.   Diet Order:  Diet 2 gram sodium Room service appropriate? Yes; Fluid consistency: Thin  EDUCATION NEEDS:   No education needs have been identified at this time  Skin:  Skin Assessment: Reviewed RN Assessment  Last BM:  08/13/17  Height:   Ht Readings from Last 1 Encounters:  08/12/17 6\' 3"  (1.905 m)    Weight:   Wt Readings from Last 1 Encounters:  08/13/17 199 lb 11.8 oz (90.6 kg)    Ideal Body Weight:  89.1 kg  BMI:  Body mass index is 24.97 kg/m.  Estimated Nutritional Needs:   Kcal:  1950-2100  Protein:  90-105 grams  Fluid:  1.9-2.1 L    Naethan Bracewell A. Jimmye Norman, RD, LDN, CDE Pager: [phone] After hours Pager: [phone]

## 2017-08-14 DIAGNOSIS — R55 Syncope and collapse: Principal | ICD-10-CM

## 2017-08-14 DIAGNOSIS — D649 Anemia, unspecified: Secondary | ICD-10-CM

## 2017-08-14 DIAGNOSIS — I480 Paroxysmal atrial fibrillation: Secondary | ICD-10-CM

## 2017-08-14 DIAGNOSIS — I5032 Chronic diastolic (congestive) heart failure: Secondary | ICD-10-CM

## 2017-08-14 LAB — BASIC METABOLIC PANEL
Anion gap: 8 (ref 5–15)
BUN: 33 mg/dL — AB (ref 6–20)
CALCIUM: 8.1 mg/dL — AB (ref 8.9–10.3)
CHLORIDE: 105 mmol/L (ref 101–111)
CO2: 23 mmol/L (ref 22–32)
CREATININE: 1.89 mg/dL — AB (ref 0.61–1.24)
GFR calc Af Amer: 36 mL/min — ABNORMAL LOW (ref >60)
GFR calc non Af Amer: 31 mL/min — ABNORMAL LOW (ref >60)
Glucose, Bld: 112 mg/dL — ABNORMAL HIGH (ref 65–99)
Potassium: 4.4 mmol/L (ref 3.5–5.1)
SODIUM: 136 mmol/L (ref 135–145)

## 2017-08-14 LAB — TYPE AND SCREEN
ABO/RH(D): O POS
ANTIBODY SCREEN: NEGATIVE
Unit division: 0

## 2017-08-14 LAB — CBC
HCT: 24.1 % — ABNORMAL LOW (ref 39.0–52.0)
Hemoglobin: 7.6 g/dL — ABNORMAL LOW (ref 13.0–17.0)
MCH: 27.8 pg (ref 26.0–34.0)
MCHC: 31.5 g/dL (ref 30.0–36.0)
MCV: 88.3 fL (ref 78.0–100.0)
PLATELETS: 114 10*3/uL — AB (ref 150–400)
RBC: 2.73 MIL/uL — ABNORMAL LOW (ref 4.22–5.81)
RDW: 21.6 % — AB (ref 11.5–15.5)
WBC: 4.3 10*3/uL (ref 4.0–10.5)

## 2017-08-14 LAB — BPAM RBC
BLOOD PRODUCT EXPIRATION DATE: 201901102359
ISSUE DATE / TIME: 201812200035
UNIT TYPE AND RH: 5100

## 2017-08-14 MED ORDER — FUROSEMIDE 40 MG PO TABS
40.0000 mg | ORAL_TABLET | Freq: Two times a day (BID) | ORAL | Status: DC
  Administered 2017-08-15: 40 mg via ORAL
  Filled 2017-08-14: qty 1

## 2017-08-14 MED ORDER — METOPROLOL TARTRATE 12.5 MG HALF TABLET: 12.5000 mg | ORAL_TABLET | Freq: Two times a day (BID) | ORAL | Status: DC

## 2017-08-14 MED ORDER — POTASSIUM CHLORIDE CRYS ER 10 MEQ PO TBCR
10.0000 meq | EXTENDED_RELEASE_TABLET | Freq: Two times a day (BID) | ORAL | Status: DC
Start: 2017-08-15 — End: 2017-08-15
  Administered 2017-08-15: 10 meq via ORAL
  Filled 2017-08-14: qty 1

## 2017-08-14 MED ORDER — SODIUM CHLORIDE 0.9 % IV BOLUS (SEPSIS)
500.0000 mL | Freq: Once | INTRAVENOUS | Status: AC
  Administered 2017-08-14: 500 mL via INTRAVENOUS

## 2017-08-14 NOTE — Progress Notes (Signed)
Physical Therapy Treatment Patient Details Name: Richard [name] MRN: [id number] DOB: Aug 12, 1931 Today's Date: 08/14/2017    History of Present Illness Pt is an 83 y.o. male with PMH significant for CHF, CKD, a-fib, pacemaker referred by PCP for admission due to syncope as pt has reportedly passed out several times at home the past week; found to have low Hgb and transfused 1 unit prbc. Other PMH includes OSA on CPAP, CVA, DM, GERD, anxiety, arthritis. Of note, admitted 2 months ago for worsening weakness and DOE.    PT Comments    Patient continues to make very slow progress with mobility.  This am, continues to have documented orthostatic hypotension.  And note drop in Hgb to 7.6.  Somewhat concerned about wife's ability to assist husband at home.  If progress does not improve, may need to consider SNF for continued therapy to facilitate safe d/c home.      Follow Up Recommendations  Supervision/Assistance - 24 hour;Home health PT;SNF (If progress does not occur more rapidly)     Equipment Recommendations  None recommended by PT    Recommendations for Other Services       Precautions / Restrictions Precautions Precautions: Fall Precaution Comments: watch BP Restrictions Weight Bearing Restrictions: No    Mobility  Bed Mobility Overal bed mobility: Needs Assistance Bed Mobility: Supine to Sit;Sit to Supine     Supine to sit: Min guard Sit to supine: Min guard   General bed mobility comments: Min guard for safety only.  Patient sat EOB and performed a few LE ex.  Family entered and patient wanted to return to supine.  No dizziness noted in sitting.  Transfers                 General transfer comment: Declined  Ambulation/Gait                 Stairs            Wheelchair Mobility    Modified Rankin (Stroke Patients Only)       Balance     Sitting balance-Leahy Scale: Fair                                      Cognition  Arousal/Alertness: Awake/alert Behavior During Therapy: WFL for tasks assessed/performed Overall Cognitive Status: Impaired/Different from baseline Area of Impairment: Awareness;Attention;Memory                   Current Attention Level: Selective Memory: Decreased short-term memory     Awareness: Emergent          Exercises General Exercises - Lower Extremity Ankle Circles/Pumps: AROM;Both;10 reps;Seated Long Arc Quad: AROM;AAROM;Both;5 reps;Seated    General Comments        Pertinent Vitals/Pain Pain Assessment: No/denies pain    Home Living                      Prior Function            PT Goals (current goals can now be found in the care plan section) Acute Rehab PT Goals Patient Stated Goal: Return home Progress towards PT goals: Progressing toward goals(Very slow)    Frequency    Min 3X/week      PT Plan Current plan remains appropriate    Co-evaluation  AM-PAC PT "6 Clicks" Daily Activity  Outcome Measure  Difficulty turning over in bed (including adjusting bedclothes, sheets and blankets)?: None Difficulty moving from lying on back to sitting on the side of the bed? : None Difficulty sitting down on and standing up from a chair with arms (e.g., wheelchair, bedside commode, etc,.)?: A Little Help needed moving to and from a bed to chair (including a wheelchair)?: A Little Help needed walking in hospital room?: A Little Help needed climbing 3-5 steps with a railing? : A Lot 6 Click Score: 19    End of Session Equipment Utilized During Treatment: Oxygen Activity Tolerance: Patient tolerated treatment well(with minimal activity only) Patient left: in bed;with call bell/phone within reach;with family/visitor present Nurse Communication: Mobility status(Requesting soup - unable to eat dinner) PT Visit Diagnosis: Unsteadiness on feet (R26.81);Other abnormalities of gait and mobility (R26.89);Muscle weakness  (generalized) (M62.81)     Time: 2500-3704 PT Time Calculation (min) (ACUTE ONLY): 10 min  Charges:  $Therapeutic Activity: 8-22 mins                    G Codes:       Carita Pian. Sanjuana Kava, [facility] Acute Rehab Services Pager [place] 08/14/2017, 6:26 PM

## 2017-08-14 NOTE — Progress Notes (Signed)
Occupational Therapy Evaluation Patient Details Name: Richard [name] MRN: [id number] DOB: [birth year] Today's Date: 08/14/2017    History of Present Illness Pt is an 83 y.o. male with PMH significant for CHF, CKD, a-fib, pacemaker referred by PCP for admission due to syncope as pt has reportedly passed out several times at home the past week; found to have low Hgb and transfused 1 unit prbc. Other PMH includes OSA on CPAP, CVA, DM, GERD, anxiety, arthritis. Of note, admitted 2 months ago for worsening weakness and DOE.   Clinical Impression   PTA, pt was ambulating with RW and assistance from wife and had assistance with ADL as needed. Pt/wife state that pt has become progressively weaker in the last week and has had several falls/syncopal episodes at home. Pt able to complete bed - BSC transfers with BP ranging from  142/75 (HR 113) supine to 90/75  (HR 139) end of session. Dyspnea 2/4 but no complaints of dizziness. Pt states "I just feel worn out". Concerned of pt's risk of falls, decline in functional level of independence and ability for wife to care for pt at home. Feel pt would do best in  home environment with initial 24/7 assistance of home health company; however, if that service not available, feel pt would benefit from rehab at [facility]. Will follow acutely to facilitate safe DC to next venue of care.     Follow Up Recommendations  SNF vs Home health OT;Supervision/Assistance - 24 hour - pending progress   Equipment Recommendations  None recommended by OT    Recommendations for Other Services Other (comment)(Palliative Medicine)     Precautions / Restrictions Precautions Precautions: Fall Precaution Comments: watch BP      Mobility Bed Mobility Overal bed mobility: Needs Assistance Bed Mobility: Supine to Sit;Sit to Supine     Supine to sit: HOB elevated;Min guard Sit to supine: Min guard;HOB elevated      Transfers Overall transfer level: Needs assistance    Transfers: Sit to/from [employer] Sit to Stand: Min assist Stand pivot transfers: Min assist       General transfer comment: SOB wtih trasnfers    Balance Overall balance assessment: Needs assistance;History of Falls Sitting-balance support: Feet supported Sitting balance-Leahy Scale: Fair     Standing balance support: During functional activity Standing balance-Leahy Scale: Poor                             ADL either performed or assessed with clinical judgement   ADL Overall ADL's : Needs assistance/impaired Eating/Feeding: Set up   Grooming: Minimal assistance;Sitting   Upper Body Bathing: Minimal assistance;Sitting   Lower Body Bathing: Moderate assistance;Sit to/from stand   Upper Body Dressing : Moderate assistance;Sitting   Lower Body Dressing: Maximal assistance;Sit to/from stand   Toilet Transfer: Minimal assistance;BSC;Stand-pivot   Toileting- Water quality scientist and Hygiene: Moderate assistance;Sit to/from stand       Functional mobility during ADLs: Minimal assistance;Cueing for safety General ADL Comments: BP drop but not complaining of being dizzy. SOB noted with minimal activity     Vision Baseline Vision/History: Wears glasses       Perception     Praxis      Pertinent Vitals/Pain Pain Assessment: No/denies pain     Hand Dominance Right   Extremity/Trunk Assessment Upper Extremity Assessment Upper Extremity Assessment: Generalized weakness   Lower Extremity Assessment Lower Extremity Assessment: Defer to PT evaluation   Cervical / Trunk  Assessment Cervical / Trunk Assessment: Kyphotic   Communication Communication Communication: HOH   Cognition Arousal/Alertness: Awake/alert Behavior During Therapy: WFL for tasks assessed/performed Overall Cognitive Status: Impaired/Different from baseline Area of Impairment: Awareness;Attention;Memory                   Current Attention Level:  Selective Memory: Decreased short-term memory     Awareness: Emergent   General Comments: Wife states "he is confused at times but getting better"   General Comments  see vitals flowsheet    Exercises Exercises: Other exercises Other Exercises Other Exercises: encouraged bed level exercises   Shoulder Instructions      Home Living Family/patient expects to be discharged to:: Private residence Living Arrangements: Spouse/significant other Available Help at Discharge: Family;Available 24 hours/day Type of Home: House Home Access: Ramped entrance     Home Layout: One level     Bathroom Shower/Tub: Occupational psychologist: Handicapped height Bathroom Accessibility: Yes How Accessible: Accessible via walker(sideways) Home Equipment: Walker - 2 wheels;Cane - single point;Bedside commode;Hospital bed;Shower seat;Wheelchair - power          Prior Functioning/Environment Level of Independence: Needs assistance  Gait / Transfers Assistance Needed: Ambulates with RW and spouse assisting ADL's / Homemaking Assistance Needed: assisted by spouse for all ADLs. Has home aide come 2 days/wk to assist as well(this status is for last week. Before that, pt did his own AD)            OT Problem List: Decreased strength;Decreased activity tolerance;Impaired balance (sitting and/or standing);Decreased safety awareness;Decreased knowledge of use of DME or AE;Cardiopulmonary status limiting activity      OT Treatment/Interventions: Self-care/ADL training;Therapeutic exercise;Energy conservation;DME and/or AE instruction;Therapeutic activities;Cognitive remediation/compensation;Patient/family education    OT Goals(Current goals can be found in the care plan section) Acute Rehab OT Goals Patient Stated Goal: Return home OT Goal Formulation: With patient/family Time For Goal Achievement: 08/28/17 Potential to Achieve Goals: Good  OT Frequency: Min 2X/week   Barriers to D/C:             Co-evaluation              AM-PAC PT "6 Clicks" Daily Activity     Outcome Measure Help from another person eating meals?: A Little Help from another person taking care of personal grooming?: A Little Help from another person toileting, which includes using toliet, bedpan, or urinal?: A Little Help from another person bathing (including washing, rinsing, drying)?: A Lot Help from another person to put on and taking off regular upper body clothing?: A Little Help from another person to put on and taking off regular lower body clothing?: A Lot 6 Click Score: 16   End of Session Equipment Utilized During Treatment: Gait belt;Oxygen(2L) Nurse Communication: Mobility status;Other (comment)(vitals)  Activity Tolerance: Patient tolerated treatment well Patient left: in bed;with call bell/phone within reach;with bed alarm set;with family/visitor present  OT Visit Diagnosis: Unsteadiness on feet (R26.81);Muscle weakness (generalized) (M62.81);History of falling (Z91.81);Other symptoms and signs involving cognitive function                Time: 5364-6803 OT Time Calculation (min): 31 min Charges:  OT General Charges $OT Visit: 1 Visit OT Evaluation $OT Eval Moderate Complexity: 1 Mod OT Treatments $Self Care/Home Management : 8-22 mins G-Codes:     [facility], OT/L  [phone] 08/14/2017  Eleanora Guinyard,[place] 08/14/2017, 9:50 AM

## 2017-08-14 NOTE — Progress Notes (Signed)
PROGRESS NOTE [facility]   Richard [name]   BPZ:[id number] DOB: Jan 26, 1931  DOA: 08/12/2017 PCP: Prince Solian, MD   Brief Narrative:  Richard [name] is 83 y.o.malewith medical history significant ofDM, HTN, Stroke, dCHF, CKD, afib (not on anticoagulation due to age/bleeding) pacemaker went to follow up with his pcp today for several exacerbations of his chf . His lasix has been decreased then increased again due to issues with both his kidneys and heart. When his lasix was decreased he started to swell, so it was then increased again per wife over a week ago. The swelling has improved. Over the last week and half he has passed out several times at home. When he gets up to move around he gets weak and dizzy and passes out. No fevers. No n/v/d. He has no peripheral swelling now. Today he went to see his PCP and he got up to get on the weight scale and he got dizzy and passed out at the office. His HR went down to the 30s, his bp went down and his oxygen sats went down. Pt feels fine now. Denies any chest pain. No sob. No recent fevers. Referred for admission for his syncope  Subjective: Patient seen and examined, his Hgb drop slight today. He report feeling better today. No acute events overnight   Assessment & Plan: Near syncope  Related to orthostatics. ? Due to diuretics, hypoperfusion and anemia  Improved with hydration, will need to let BP run a lil higher  Card consulted and adjusted metoprolol to 12.5 mg BID, d/ced lisinopril  Continue IVF hydration as needed   Anemia Iron deficiency  Was transfused 1 units PRB Hgb drop slight today, may be hemodilution No overt bleeding  Check CBC in AM  If Hgb < 7 transfuse   Chronic diastolic CHF  Seems to be compensated  Holding Lasix for now  Cards recommendations appreciated   PAF - Rate controlled  Patient with frequents fall, so not A/C  Continue Amio for rate control   AKI on CKD stage III  Renal  function improve with hydration Need to balance out overload and kidney function  Continue monitor renal function   HTN   BP stable  As per above   DVT prophylaxis: SCD's  Code Status: Full Code  Family Communication: Wife at bedside  Disposition Plan: Home when medically stable   Consultants:   Cardiology   Procedures:   None   Antimicrobials:  None     Objective: Vitals:   08/14/17 0716 08/14/17 0719 08/14/17 0900 08/14/17 1142  BP: 123/61 (!) 78/53 (!) 142/75 (!) 151/73  Pulse: 82 (!) 129 (!) 113 (!) 103  Resp:      Temp:      TempSrc:      SpO2:   94% 95%  Weight:      Height:        Intake/Output Summary (Last 24 hours) at 08/14/2017 1713 Last data filed at 08/14/2017 1330 Gross per 24 hour  Intake 1440 ml  Output 1201 ml  Net 239 ml   Filed Weights   08/12/17 2149 08/13/17 0438 08/14/17 0100  Weight: 90.1 kg (198 lb 10.2 oz) 90.6 kg (199 lb 11.8 oz) 92.1 kg (203 lb 0.7 oz)    Examination:  General: Pt is alert, awake, not in acute distress Cardiovascular: RRR, E4/M3  + systolic murmur  Respiratory: CTA bilaterally, no wheezing, no rhonchi Abdominal: Soft, NT, ND, bowel sounds + Extremities: trace edema, no  cyanosis   Data Reviewed: I have personally reviewed following labs and imaging studies  CBC: Recent Labs  Lab 08/12/17 1737 08/12/17 1800 08/13/17 0500 08/14/17 0612  WBC 5.8  --  4.6 4.3  HGB 7.8* 8.2* 8.3* 7.6*  HCT 24.9* 24.0* 26.6* 24.1*  MCV 93.6  --  88.4 88.3  PLT 190  --  126* 295*   Basic Metabolic Panel: Recent Labs  Lab 08/12/17 1800 08/13/17 0500 08/14/17 0612  NA 138 138 136  K 5.2* 4.8 4.4  CL 101 104 105  CO2  --  25 23  GLUCOSE 108* 95 112*  BUN 44* 45* 33*  CREATININE 2.40* 2.45* 1.89*  CALCIUM  --  8.2* 8.1*   GFR: Estimated Creatinine Clearance: 34.8 mL/min (A) (by C-G formula based on SCr of 1.89 mg/dL (H)). Liver Function Tests: No results for input(s): AST, ALT, ALKPHOS, BILITOT, PROT, ALBUMIN  in the last 168 hours. No results for input(s): LIPASE, AMYLASE in the last 168 hours. No results for input(s): AMMONIA in the last 168 hours. Coagulation Profile: No results for input(s): INR, PROTIME in the last 168 hours. Cardiac Enzymes: Recent Labs  Lab 08/12/17 1737 08/12/17 2302 08/13/17 0500 08/13/17 1126  TROPONINI 0.03* <0.03 <0.03 <0.03   BNP (last 3 results) Recent Labs    07/21/17 1235  PROBNP 13,733*   HbA1C: No results for input(s): HGBA1C in the last 72 hours. CBG: No results for input(s): GLUCAP in the last 168 hours. Lipid Profile: No results for input(s): CHOL, HDL, LDLCALC, TRIG, CHOLHDL, LDLDIRECT in the last 72 hours. Thyroid Function Tests: No results for input(s): TSH, T4TOTAL, FREET4, T3FREE, THYROIDAB in the last 72 hours. Anemia Panel: Recent Labs    08/12/17 2302  VITAMINB12 522  FOLATE 15.9  FERRITIN 162  TIBC 283  IRON 33*  RETICCTPCT 2.7   Sepsis Labs: No results for input(s): PROCALCITON, LATICACIDVEN in the last 168 hours.  No results found for this or any previous visit (from the past 240 hour(s)).    Radiology Studies: Dg Chest Port 1 View  Result Date: 08/12/2017 CLINICAL DATA:  Shortness of breath with exertion. Ongoing for 2-3 weeks. No chest pain. EXAM: PORTABLE CHEST 1 VIEW COMPARISON:  07/21/2017 FINDINGS: There is mild bilateral chronic interstitial thickening. There is no focal parenchymal opacity. There is no pleural effusion or pneumothorax. There is stable cardiomegaly. There is a dual lead cardiac pacemaker. The osseous structures are unremarkable. IMPRESSION: No active disease. Electronically Signed   By: Kathreen Devoid   On: 08/12/2017 17:48      Scheduled Meds: . acetaminophen  500 mg Oral BID  . amiodarone  200 mg Oral Q M,W,F  . aspirin EC  81 mg Oral Daily  . atorvastatin  40 mg Oral q1800  . [START ON 08/25/2017] cyanocobalamin  1,000 mcg Intramuscular Q30 days  . diazepam  2.5 mg Oral QHS  . divalproex   250 mg Oral BID  . DULoxetine  60 mg Oral QPM  . ferrous sulfate  325 mg Oral Q breakfast  . [START ON 08/15/2017] furosemide  40 mg Oral BID  . levothyroxine  25 mcg Oral QAC breakfast  . [START ON 08/15/2017] metoprolol tartrate  12.5 mg Oral BID  . pantoprazole  40 mg Oral Q0600  . [START ON 08/15/2017] potassium chloride  10 mEq Oral BID  . sodium chloride flush  3 mL Intravenous Q12H   Continuous Infusions: . sodium chloride       LOS:  1 day    Time spent: Total of 25 minutes spent with pt, greater than 50% of which was spent in discussion of  treatment, counseling and coordination of care    Chipper Oman, MD Pager: Text Page via [email or web address]   If 7PM-7AM, please contact night-coverage [email or web address] 08/14/2017, 5:13 PM

## 2017-08-14 NOTE — Consult Note (Signed)
Cardiology Consultation:   Patient ID: Richard [name]; [id number]; 14-Jul-1931   Admit date: 08/12/2017 Date of Consult: 08/14/2017  Primary Care Provider: Prince Solian, MD Primary Cardiologist: Dr. Acie Fredrickson  Patient Profile:   Richard [name] is a 83 y.o. male with a hx of chronic diastolic HF, atrial fibrillation no longer on a/c given advanced age, fragility and high fall/bleed risk (not felt good candidate for Watchman due weak), CHB s/p PPM, followed by Dr. Rayann Heman, H/o stroke, OSA on CPAP therapy, HTN, HLD, T2DM and hypothyroidism who is being seen today for the evaluation of near syncope at the request of Dr. Patrecia Pour.   Last seen by Dr. Acie Fredrickson 08/05/17--> may be need of palliative/hospice care.   History of Present Illness:   Mr. Mcmichen recently has several CHF exacerbation requiring up and down titration of lasix due to kidney function and volume status. Patient does complains of intermittent dizziness when standing up or walking. No LOC. He was at PCP office 08/12/17 and felt very weak and almost passed out when trying to get weight and sent to ER. Wife at bedside denies LOC. He has increased amount of shortness of breath for the past few weeks. No chest pain.   He was transfused 1unit of PRBC for Hgb of 7.8 on presentation. That improved to 8.3 now again trended down to 7.6 today. IT was 10.2 on 12.6/18.   Scr of 2.4 on admission that improved to 1.89 today with hydration.   Echo this admission showed LVEF of 45-50%, grade 3DD, severe dilated LA. (echo 04/2017 with LVEF of 50-55%, grade 1 DD).   Past Medical History:  Diagnosis Date  . Anxiety   . Arthritis   . Atrial fibrillation ([place])    a. Dx 03/2013, notes report atrial fibrillation/atrial flutter, placed on amiodarone. NSR in subsequent OV's.  . B12 deficiency anemia   . Cancer ([place])   . Chest pain    a. H/o CTA negative for PE 2012, normal cath 2005, normal nucs previously including 05/2012.  Marland Kitchen Complete heart  block ([facility])    a. s/p Medtronic Adapta L model ADDRL 1 (serial number [id number]) pacemaker.  . Dementia   . GERD (gastroesophageal reflux disease)   . Hiatal hernia   . Hyperlipidemia   . Hypertension   . Iron deficiency anemia   . LBBB (left bundle branch block)   . Microcytic anemia   . Nephrolithiasis   . On home oxygen therapy    a. 2L w/CPAP at night  . Orthostatic hypotension   . OSA on CPAP    setting = 4  . Pacemaker   . [place] spotted fever ~ 1945  . Sinus drainage   . Stroke ([place]) 05/2016  . Type II diabetes mellitus ([place])     Past Surgical History:  Procedure Laterality Date  . CARDIAC CATHETERIZATION     by Dr. Acie Fredrickson, January 24, 2004, that shows minimal coronary artery irregularities and normal left ventricular function  . CATARACT EXTRACTION W/ INTRAOCULAR LENS  IMPLANT, BILATERAL Bilateral   . COLONOSCOPY WITH PROPOFOL N/A 05/23/2016   Procedure: COLONOSCOPY WITH PROPOFOL;  Surgeon: Jerene Bears, MD;  Location: [place];  Service: Gastroenterology;  Laterality: N/A;  . ENTEROSCOPY N/A 06/02/2016   Procedure: ENTEROSCOPY;  Surgeon: Gatha Mayer, MD;  Location: [facility] ENDOSCOPY;  Service: Endoscopy;  Laterality: N/A;  . ESOPHAGOGASTRODUODENOSCOPY N/A 05/22/2016   Procedure: ESOPHAGOGASTRODUODENOSCOPY (EGD);  Surgeon: Jerene Bears, MD;  Location: [facility] ENDOSCOPY;  Service: Endoscopy;  Laterality: N/A;  . ESOPHAGOGASTRODUODENOSCOPY (EGD) WITH PROPOFOL N/A 06/21/2015   Procedure: ESOPHAGOGASTRODUODENOSCOPY (EGD) WITH PROPOFOL;  Surgeon: Gatha Mayer, MD;  Location: [facility] ENDOSCOPY;  Service: Endoscopy;  Laterality: N/A;  . GIVENS CAPSULE STUDY N/A 05/23/2016   Procedure: GIVENS CAPSULE STUDY;  Surgeon: Jerene Bears, MD;  Location: [place];  Service: Gastroenterology;  Laterality: N/A;  . HOT HEMOSTASIS N/A 06/02/2016   Procedure: HOT HEMOSTASIS (ARGON PLASMA COAGULATION/BICAP);  Surgeon: Gatha Mayer, MD;  Location: [facility] ENDOS[facility];  Service: Endoscopy;   Laterality: N/A;  . INGUINAL HERNIA REPAIR Right   . IR RADIOLOGIST EVAL & MGMT  11/25/2016  . IR RADIOLOGIST EVAL & MGMT  01/22/2017  . IR VERTEBROPLASTY CERV/THOR BX INC UNI/BIL INC/INJECT/IMAGING  01/30/2017  . IR VERTEBROPLASTY EA ADDL (T&LS) BX INC UNI/BIL INC INJECT/IMAGING  11/28/2016  . IR VERTEBROPLASTY EA ADDL (T&LS) BX INC UNI/BIL INC INJECT/IMAGING  01/30/2017  . IR VERTEBROPLASTY LUMBAR BX INC UNI/BIL INC/INJECT/IMAGING  11/28/2016  . LUMBAR DISC SURGERY  ~ 1993  . PERMANENT PACEMAKER INSERTION N/A 06/03/2014   MDT Adapta L implanted by Dr Rayann Heman for syncope and transient AV block  . SKIN CANCER EXCISION     "lower lip" (04/06/2013)  . TEMPORARY PACEMAKER INSERTION N/A 05/31/2014   Procedure: TEMPORARY WIRE;  Surgeon: Sinclair Grooms, MD;  Location: [facility] CATH LAB;  Service: Cardiovascular;  Laterality: N/A;  . VASECTOMY     Hx of      Inpatient Medications: Scheduled Meds: . acetaminophen  500 mg Oral BID  . amiodarone  200 mg Oral Q M,W,F  . aspirin EC  81 mg Oral Daily  . atorvastatin  40 mg Oral q1800  . [START ON 08/25/2017] cyanocobalamin  1,000 mcg Intramuscular Q30 days  . diazepam  2.5 mg Oral QHS  . divalproex  250 mg Oral BID  . DULoxetine  60 mg Oral QPM  . ferrous sulfate  325 mg Oral Q breakfast  . levothyroxine  25 mcg Oral QAC breakfast  . pantoprazole  40 mg Oral Q0600  . sodium chloride flush  3 mL Intravenous Q12H   Continuous Infusions: . sodium chloride     PRN Meds: sodium chloride, sodium chloride flush  Allergies:    Allergies  Allergen Reactions  . Codeine Other (See Comments)    Hallucinations   . Citalopram Swelling and Rash    Social History:   Social History   Socioeconomic History  . Marital status: Married    Spouse name: Not on file  . Number of children: 2  . Years of education: Not on file  . Highest education level: Not on file  Social Needs  . Financial resource strain: Not on file  . Food insecurity - worry: Not on file  .  Food insecurity - inability: Not on file  . Transportation needs - medical: Not on file  . Transportation needs - non-medical: Not on file  Occupational History  . Occupation: retired  Tobacco Use  . Smoking status: Former Smoker    Packs/day: 0.00    Years: 20.00    Pack years: 0.00    Types: Cigarettes  . Smokeless tobacco: Never Used  . Tobacco comment: 04/06/2013 "quit smoking years ago; didn't smoke that much; probably 20 years; 1ppd"  Substance and Sexual Activity  . Alcohol use: No  . Drug use: No  . Sexual activity: No  Other Topics Concern  . Not on file  Social History Narrative  .  Not on file    Family History:    Family History  Problem Relation Age of Onset  . Thyroid disease Mother        goiter  . Pneumonia Father   . Other Unknown        Parents both died of old age; other conditions not known  . Stroke Brother   . Breast cancer Sister      ROS:  Please see the history of present illness.  ROS All other ROS reviewed and negative.     Physical Exam/Data:   Vitals:   08/14/17 0716 08/14/17 0719 08/14/17 0900 08/14/17 1142  BP: 123/61 (!) 78/53 (!) 142/75 (!) 151/73  Pulse: 82 (!) 129 (!) 113 (!) 103  Resp:      Temp:      TempSrc:      SpO2:   94% 95%  Weight:      Height:        Intake/Output Summary (Last 24 hours) at 08/14/2017 1419 Last data filed at 08/14/2017 0943 Gross per 24 hour  Intake 1440 ml  Output 901 ml  Net 539 ml   Filed Weights   08/12/17 2149 08/13/17 0438 08/14/17 0100  Weight: 198 lb 10.2 oz (90.1 kg) 199 lb 11.8 oz (90.6 kg) 203 lb 0.7 oz (92.1 kg)   Body mass index is 25.38 kg/m.  General:  Chronically ill appearing pale male in no acute distress HEENT: normal Lymph: no adenopathy Neck: +  JVD Endocrine:  No thryomegaly Vascular: No carotid bruits; FA pulses 2+ bilaterally without bruits  Cardiac:  normal S1, S2; RRR; no murmur  Lungs:  Faint bibasilar rales Abd: soft, nontender, no hepatomegaly  Ext: Trace  edema Musculoskeletal:  No deformities, BUE and BLE strength normal and equal Skin: warm and dry , multiple bruise  Neuro:  No focal abnormalities noted Psych:  Lethargic and drawsy   EKG:  The EKG was personally reviewed and demonstrates:  Sinus rhythm with LBBB Telemetry:  Telemetry was personally reviewed and demonstrates:  Paced rhythm  Relevant CV Studies: Echo 08/13/17 Study Conclusions  - Left ventricle: The cavity size was normal. There was severe   hypertrophy of the septum with otherwise moderate concentric   hypertrophy. Systolic function was mildly reduced. The estimated   ejection fraction was in the range of 45% to 50%. Hypokinesis of   the anteroseptal, inferior, and inferoseptal myocardium. Doppler   parameters are consistent with a reversible restrictive pattern,   indicative of decreased left ventricular diastolic compliance   and/or increased left atrial pressure (grade 3 diastolic   dysfunction). Doppler parameters are consistent with high   ventricular filling pressure. - Ventricular septum: Septal motion showed abnormal function and   dyssynergy consistent with ventricular pacing. - Aortic valve: Transvalvular velocity was within the normal range.   There was no stenosis. There was no regurgitation. - Mitral valve: Transvalvular velocity was within the normal range.   There was no evidence for stenosis. There was no regurgitation. - Left atrium: The atrium was severely dilated. - Right ventricle: The cavity size was normal. Wall thickness was   normal. - Tricuspid valve: There was no regurgitation.  Laboratory Data:  Chemistry Recent Labs  Lab 08/12/17 1800 08/13/17 0500 08/14/17 0612  NA 138 138 136  K 5.2* 4.8 4.4  CL 101 104 105  CO2  --  25 23  GLUCOSE 108* 95 112*  BUN 44* 45* 33*  CREATININE 2.40* 2.45* 1.89*  CALCIUM  --  8.2* 8.1*  GFRNONAA  --  23* 31*  GFRAA  --  26* 36*  ANIONGAP  --  9 8    No results for input(s): PROT,  ALBUMIN, AST, ALT, ALKPHOS, BILITOT in the last 168 hours. Hematology Recent Labs  Lab 08/12/17 1737 08/12/17 1800 08/12/17 2302 08/13/17 0500 08/14/17 0612  WBC 5.8  --   --  4.6 4.3  RBC 2.66*  --  2.45* 3.01* 2.73*  HGB 7.8* 8.2*  --  8.3* 7.6*  HCT 24.9* 24.0*  --  26.6* 24.1*  MCV 93.6  --   --  88.4 88.3  MCH 29.3  --   --  27.6 27.8  MCHC 31.3  --   --  31.2 31.5  RDW 18.7*  --   --  20.8* 21.6*  PLT 190  --   --  126* 114*   Cardiac Enzymes Recent Labs  Lab 08/12/17 1737 08/12/17 2302 08/13/17 0500 08/13/17 1126  TROPONINI 0.03* <0.03 <0.03 <0.03   No results for input(s): TROPIPOC in the last 168 hours.  BNP Recent Labs  Lab 08/12/17 1741  BNP 392.0*    DDimer No results for input(s): DDIMER in the last 168 hours.  Radiology/Studies:  Dg Chest Port 1 View  Result Date: 08/12/2017 CLINICAL DATA:  Shortness of breath with exertion. Ongoing for 2-3 weeks. No chest pain. EXAM: PORTABLE CHEST 1 VIEW COMPARISON:  07/21/2017 FINDINGS: There is mild bilateral chronic interstitial thickening. There is no focal parenchymal opacity. There is no pleural effusion or pneumothorax. There is stable cardiomegaly. There is a dual lead cardiac pacemaker. The osseous structures are unremarkable. IMPRESSION: No active disease. Electronically Signed   By: Kathreen Devoid   On: 08/12/2017 17:48    Assessment and Plan:   1. Orthostatic hypotension - His symptoms is due to orthostatic and over diuresis. He was given hydration.    Orthostatic VS for the past 24 hrs:  BP- Lying Pulse- Lying BP- Sitting Pulse- Sitting BP- Standing at 0 minutes Pulse- Standing at 0 minutes  08/14/17 0900 142/75 113 128/59 122 90/73 139  08/14/17 0719 - - - - (!) 78/53 129  08/14/17 0716 - - 123/61 82 - -  08/14/17 0715 169/79 104 - - - -  08/13/17 2022 - - - - 93/68 137  08/13/17 2020 - - 100/63 111 - -   2. Chronic diastolic CHF - Now severe diastolic dysfunction compared to mild few months ago.  Watch volume status closely. Concern for over hydration.   3. Acute Anemia - s/p 1 units of PRBCS on 08/12/17. Hgb again trending down. It was 10.2 on 07/30/17.   Consider palliative/hospice care.   For questions or updates, please contact [place] Please consult [email or web address] for contact info under Cardiology/STEMI.   Jarrett Soho, [place]  08/14/2017 2:19 PM   As above, patient seen and examined.  Briefly he is an 83 year old male with past medical history of chronic diastolic congestive heart failure, paroxysmal atrial fibrillation not on anticoagulation because of frequent falls, prior pacemaker, prior stroke, hypertension, hyperlipidemia, diabetes mellitus for evaluation of near syncope.  Patient has difficulties with orthostatic hypotension.  He was seen in the office recently by Dr. Acie Fredrickson and felt to be somewhat dehydrated.  His Lasix was decreased from 60 mg twice daily to 60 mg daily.  However he developed increasing edema and dyspnea and Lasix was increased to 60 mg twice daily again.  Since that time he has had problems  with dizziness with standing and near syncope.  He denies increased dyspnea, chest pain or pedal edema.  He has been admitted and cardiology asked to evaluate. Electrocardiogram shows sinus rhythm with left bundle branch block. Hemoglobin 7.6.  BUN now 33 with creatinine 1.89.  Echo shows EF 45-50.   1 near syncope-clearly related to orthostasis.  His symptoms have improved with holding his diuretics.  We will need to balance volume status with symptoms of orthostasis.  I would allow his blood pressure to run higher in the lying position to avoid worsening orthostatic symptoms with standing.  Discontinue lisinopril long-term.  Decrease metoprolol to 12.5 mg twice daily.  2 chronic diastolic congestive heart failure-would resume Lasix 40 mg twice daily tomorrow morning.  He will need close follow-up for both congestive heart failure symptoms and  orthostatic symptoms.  3 prior pacemaker  4 paroxysmal atrial fibrillation-chadsvasc 7.  However he falls frequently and risk of anticoagulation outweighs benefit.  Continue amiodarone to maintain sinus rhythm.  5 chronic stage III kidney disease-he will need potassium and renal function checked 1 week after discharge.  6 hypertension-as outlined above we will allow his blood pressure to run higher to avoid orthostatic symptoms.  Kirk Ruths, MD

## 2017-08-14 NOTE — Plan of Care (Signed)
  Progressing Clinical Measurements: Ability to maintain clinical measurements within normal limits will improve 08/14/2017 0025 - Progressing by Ruben Im, RN Will remain free from infection 08/14/2017 0025 - Progressing by Ruben Im, RN Activity: Risk for activity intolerance will decrease 08/14/2017 0025 - Progressing by Ruben Im, RN Coping: Level of anxiety will decrease 08/14/2017 0025 - Progressing by Ruben Im, RN Elimination: Will not experience complications related to bowel motility 08/14/2017 0025 - Progressing by Ruben Im, RN Will not experience complications related to urinary retention 08/14/2017 0025 - Progressing by Ruben Im, RN Pain Managment: General experience of comfort will improve 08/14/2017 0025 - Progressing by Ruben Im, RN Safety: Ability to remain free from injury will improve 08/14/2017 0025 - Progressing by Ruben Im, RN

## 2017-08-14 NOTE — Care Management Note (Addendum)
Case Management Note  Patient Details  Name: Richard [name] MRN: [id number] Date of Birth: 01-02-1931  Subjective/Objective:   CHF     Action/Plan: In to speak with Pt, wife at bedside.  Prior to admission Pt lived at home with wife.  Pt PCP Is Avva, Ravisankar.  Pt uses [facility] in [place], [place].  Wife denies inability to afford medications/Food.  Wife assist's Pt with ADL's.  Wife states they have a personal care assistant that they use for: shopping/medical appointments (Once or Twice a week).  Pt's wife has used [facility] in the past and if Pt requires [facility] post-discharge she would prefer to use [facility] again. Home DME equipment includes: Walker (2 wheels), Cane, bedside commode, hospital bed, shower seat, scale and wheelchair.   NCM will continue to follow for discharge needs.            Expected Discharge Plan:  [place]  Discharge planning Services  CM Consult  Status of Service:  In process, will continue to follow  Montel Culver, BSN, RN Case Manager-Orientation. [phone] 08/14/2017, 3:49 PM

## 2017-08-15 DIAGNOSIS — D509 Iron deficiency anemia, unspecified: Secondary | ICD-10-CM

## 2017-08-15 LAB — CBC WITH DIFFERENTIAL/PLATELET
BASOS ABS: 0 10*3/uL (ref 0.0–0.1)
Basophils Relative: 0 %
Eosinophils Absolute: 0.3 10*3/uL (ref 0.0–0.7)
Eosinophils Relative: 6 %
HEMATOCRIT: 28.9 % — AB (ref 39.0–52.0)
HEMOGLOBIN: 8.8 g/dL — AB (ref 13.0–17.0)
LYMPHS ABS: 0.8 10*3/uL (ref 0.7–4.0)
LYMPHS PCT: 17 %
MCH: 27.8 pg (ref 26.0–34.0)
MCHC: 30.4 g/dL (ref 30.0–36.0)
MCV: 91.2 fL (ref 78.0–100.0)
Monocytes Absolute: 0.5 10*3/uL (ref 0.1–1.0)
Monocytes Relative: 9 %
NEUTROS ABS: 3.4 10*3/uL (ref 1.7–7.7)
Neutrophils Relative %: 68 %
PLATELETS: 121 10*3/uL — AB (ref 150–400)
RBC: 3.17 MIL/uL — AB (ref 4.22–5.81)
RDW: 21.5 % — ABNORMAL HIGH (ref 11.5–15.5)
WBC: 5 10*3/uL (ref 4.0–10.5)

## 2017-08-15 LAB — BASIC METABOLIC PANEL
ANION GAP: 7 (ref 5–15)
BUN: 20 mg/dL (ref 6–20)
CHLORIDE: 106 mmol/L (ref 101–111)
CO2: 23 mmol/L (ref 22–32)
Calcium: 8 mg/dL — ABNORMAL LOW (ref 8.9–10.3)
Creatinine, Ser: 1.51 mg/dL — ABNORMAL HIGH (ref 0.61–1.24)
GFR calc Af Amer: 47 mL/min — ABNORMAL LOW (ref >60)
GFR, EST NON AFRICAN AMERICAN: 41 mL/min — AB (ref >60)
Glucose, Bld: 187 mg/dL — ABNORMAL HIGH (ref 65–99)
POTASSIUM: 4.2 mmol/L (ref 3.5–5.1)
SODIUM: 136 mmol/L (ref 135–145)

## 2017-08-15 MED ORDER — METOPROLOL TARTRATE 25 MG PO TABS
25.0000 mg | ORAL_TABLET | Freq: Two times a day (BID) | ORAL | Status: DC
  Administered 2017-08-15: 25 mg via ORAL
  Filled 2017-08-15: qty 1

## 2017-08-15 MED ORDER — FUROSEMIDE 40 MG PO TABS: 40.0000 mg | ORAL_TABLET | Freq: Every day | ORAL | 0 refills | Status: DC

## 2017-08-15 MED ORDER — METOPROLOL TARTRATE 25 MG PO TABS: 25.0000 mg | ORAL_TABLET | Freq: Two times a day (BID) | ORAL | 0 refills | Status: DC

## 2017-08-15 NOTE — Progress Notes (Signed)
Progress Note  Patient Name: MARLEN MOLLICA Date of Encounter: 08/15/2017  Primary Cardiologist: Dr Acie Fredrickson  Subjective   No chest pain or increased dyspnea; no dizziness   Inpatient Medications    Scheduled Meds: . acetaminophen  500 mg Oral BID  . amiodarone  200 mg Oral Q M,W,F  . aspirin EC  81 mg Oral Daily  . atorvastatin  40 mg Oral q1800  . [START ON 08/25/2017] cyanocobalamin  1,000 mcg Intramuscular Q30 days  . diazepam  2.5 mg Oral QHS  . divalproex  250 mg Oral BID  . DULoxetine  60 mg Oral QPM  . ferrous sulfate  325 mg Oral Q breakfast  . furosemide  40 mg Oral BID  . levothyroxine  25 mcg Oral QAC breakfast  . metoprolol tartrate  12.5 mg Oral BID  . pantoprazole  40 mg Oral Q0600  . potassium chloride  10 mEq Oral BID  . sodium chloride flush  3 mL Intravenous Q12H   Continuous Infusions: . sodium chloride     PRN Meds: sodium chloride, sodium chloride flush   Vital Signs    Vitals:   08/14/17 0900 08/14/17 1142 08/14/17 1952 08/15/17 0418  BP: (!) 142/75 (!) 151/73 (!) 180/88 (!) 179/89  Pulse: (!) 113 (!) 103 (!) 110 (!) 114  Resp:   20 20  Temp:   98.5 F (36.9 C) (!) 97.5 F (36.4 C)  TempSrc:   Oral Oral  SpO2: 94% 95% 98% 95%  Weight:    203 lb 14.8 oz (92.5 kg)  Height:        Intake/Output Summary (Last 24 hours) at 08/15/2017 0852 Last data filed at 08/15/2017 0452 Gross per 24 hour  Intake 240 ml  Output 850 ml  Net -610 ml   Filed Weights   08/13/17 0438 08/14/17 0100 08/15/17 0418  Weight: 199 lb 11.8 oz (90.6 kg) 203 lb 0.7 oz (92.1 kg) 203 lb 14.8 oz (92.5 kg)    Telemetry    V pacing- Personally Reviewed  Physical Exam   GEN: No acute distress.  WD. Chronically ill appearing Neck: No JVD Cardiac: RRR Respiratory: mild rhonchi GI: Soft, nontender, non-distended  MS: No edema Neuro:  Nonfocal  Psych: Normal affect   Labs    Chemistry Recent Labs  Lab 08/12/17 1800 08/13/17 0500 08/14/17 0612  NA 138  138 136  K 5.2* 4.8 4.4  CL 101 104 105  CO2  --  25 23  GLUCOSE 108* 95 112*  BUN 44* 45* 33*  CREATININE 2.40* 2.45* 1.89*  CALCIUM  --  8.2* 8.1*  GFRNONAA  --  23* 31*  GFRAA  --  26* 36*  ANIONGAP  --  9 8     Hematology Recent Labs  Lab 08/12/17 1737 08/12/17 1800 08/12/17 2302 08/13/17 0500 08/14/17 0612  WBC 5.8  --   --  4.6 4.3  RBC 2.66*  --  2.45* 3.01* 2.73*  HGB 7.8* 8.2*  --  8.3* 7.6*  HCT 24.9* 24.0*  --  26.6* 24.1*  MCV 93.6  --   --  88.4 88.3  MCH 29.3  --   --  27.6 27.8  MCHC 31.3  --   --  31.2 31.5  RDW 18.7*  --   --  20.8* 21.6*  PLT 190  --   --  126* 114*    Cardiac Enzymes Recent Labs  Lab 08/12/17 1737 08/12/17 2302 08/13/17 0500 08/13/17 1126  TROPONINI 0.03* <0.03 <0.03 <0.03    BNP Recent Labs  Lab 08/12/17 1741  BNP 392.0*    Patient Profile     83 year old male with past medical history of chronic diastolic congestive heart failure, paroxysmal atrial fibrillation not on anticoagulation because of frequent falls, prior pacemaker, prior stroke, hypertension, hyperlipidemia, diabetes mellitus with orthostasis and near syncope.  Echo shows EF 45-50.     Assessment & Plan    1 near syncope-orthostatic mediated. His symptoms have improved with holding his diuretics and reducing BP meds.  We will need to balance volume status with symptoms of orthostasis.  I would allow his blood pressure to run higher in the lying position to avoid worsening orthostatic symptoms with standing.  Discontinue lisinopril long-term. Metoprolol decreased to 12.5 mg BID initially but more tachycardic this AM; increase to 25 mg BID at DC.  2 chronic diastolic congestive heart failure-will resume Lasix 40 mg twice daily. Take additional 40 mg daily for weight gain of 2-3 lbs.  He will need close follow-up for both congestive heart failure symptoms and orthostatic symptoms.  3 prior pacemaker  4 paroxysmal atrial fibrillation-chadsvasc 7.  However  he falls frequently and risk of anticoagulation outweighs benefit.  Continue amiodarone to maintain sinus rhythm.  5 chronic stage III kidney disease-check BMET one week following DC.  6 hypertension-as outlined above we will allow his blood pressure to run higher to avoid orthostatic symptoms.  Pt can be DCed from a cardiac standpoint and schedule TOC appt one week with APP and then FU with Dr Acie Fredrickson 8-12 weeks.  For questions or updates, please contact [place] Please consult [email or web address] for contact info under Cardiology/STEMI.      Signed, Kirk Ruths, MD  08/15/2017, 8:52 AM

## 2017-08-15 NOTE — Progress Notes (Signed)
Pt is wean of oxygengen, saturating at 95% on room air.

## 2017-08-15 NOTE — Discharge Summary (Signed)
Physician Discharge Summary  Richard [name]  OIN:[id number]  DOB: [birth year]  DOA: 08/12/2017 PCP: Prince Solian, MD  Admit date: 08/12/2017 Discharge date: 08/15/2017  Admitted From: Home  Disposition: Home   Recommendations for Outpatient Follow-up:  1. Follow up with PCP in 1-2 weeks 2. Follow up with cardiology in 1 weeks  3. Please obtain BMP/CBC in one week to monitor Cr and Hgb   Home Health: PT/RN Aid  Equipment/Devices: O2   Discharge Condition: Good  CODE STATUS: Full Code  Diet recommendation: Heart Healthy   Brief/Interim Summary: For full details see H&P/progress note but in brief, Richard [name] is a 83 year old male with medical history significant for diabetes mellitus, hypertension, stroke, heart failure, CKD, A. fib and pacemaker who presented to the emergency department after being seen by his PCP and while getting on that weight scale got dizzy and passed out.  Syncopal episode has been occurring prior to admission after Lasix dose was increased for fluid overload.  Patient has been having trouble with fluid overload and kidney disease having to go back and forth.  Patient was admitted with working diagnosis of syncope, felt to be secondary to dehydration  from Lasix.  Upon ED evaluation he was found to be anemic with hemoglobin of 7.3 for which he was transfused 1 unit of PRBCs.  Orthostatic vitals were positive.  Patient was treated with IV fluid, cardiology was consulted and blood pressure medications were adjusted.  He was also found to be in acute renal failure with creatinine of 2.45 which improved after IV fluid.  Patient was evaluated by physical therapy who recommended home health PT.  Patient clinically improving and stable for discharge to follow-up with PCP.  Subjective: Patient seen and examined, he wants to go home.  He denies any chest pain, dizziness, shortness of breath and palpitation.  Doing well when he sit and stand up, not feeling dizzy.   Renal function improved, hemoglobin stable.  Tolerating diet well.  Discussed with family possibility of patient going to SNF family declined as patient does not wishes to be on a nursing facility.  Discharge Diagnoses/Hospital Course:  Near syncope - Orthostatic from dehydration  Related to orthostatics. ? Due to diuretics, hypoperfusion and anemia  Improved with hydration, Cardiology consulted and recommended to let the blood pressure 100 or higher, metoprolol was decreased to 25 twice daily. Lasix initially were held to allow hydration, will be resumed upon discharge at 40 mg daily Echocardiogram shows EF of 45-50% hypokinesis of the inferior wall and grade 3 diastolic dysfunction Carotid Dopplers showed mild bilateral stenosis 1-39% at the level of ICA  Anemia Iron deficiency  Was transfused 1 units PRB Hemoglobin stable upon discharge after transfusion No overt bleeding  Check CBC 1 week Continue iron supplement  Chronic diastolic CHF  Seems to be compensated  Resume Lasix, dose lowered to 40 mg daily Close follow-up with cardiology in 1 week  PAF - Rate controlled  Patient with frequents fall, so not A/C  Continue Amio for rate control   AKI on CKD stage III  Renal function improve with hydration Need to balance out overload and kidney function  Close monitoring of her creatinine as an outpatient  HTN   BP stable  As per above   All other chronic medical condition were stable during the hospitalization.  Patient was seen by physical therapy, home health PT On the day of the discharge the patient's vitals were stable, and no other acute medical  condition were reported by patient. the patient was felt safe to be discharge to home  Discharge Instructions  You were cared for by a hospitalist during your hospital stay. If you have any questions about your discharge medications or the care you received while you were in the hospital after you are discharged, you can  call the unit and asked to speak with the hospitalist on call if the hospitalist that took care of you is not available. Once you are discharged, your primary care physician will handle any further medical issues. Please note that NO REFILLS for any discharge medications will be authorized once you are discharged, as it is imperative that you return to your primary care physician (or establish a relationship with a primary care physician if you do not have one) for your aftercare needs so that they can reassess your need for medications and monitor your lab values.  Discharge Instructions    Call MD for:  difficulty breathing, headache or visual disturbances   Complete by:  As directed    Call MD for:  extreme fatigue   Complete by:  As directed    Call MD for:  hives   Complete by:  As directed    Call MD for:  persistant dizziness or light-headedness   Complete by:  As directed    Call MD for:  persistant nausea and vomiting   Complete by:  As directed    Call MD for:  redness, tenderness, or signs of infection (pain, swelling, redness, odor or green/yellow discharge around incision site)   Complete by:  As directed    Call MD for:  severe uncontrolled pain   Complete by:  As directed    Call MD for:  temperature >100.4   Complete by:  As directed    Diet - low sodium heart healthy   Complete by:  As directed    Increase activity slowly   Complete by:  As directed      Allergies as of 08/15/2017      Reactions   Codeine Other (See Comments)   Hallucinations   Citalopram Swelling, Rash      Medication List    STOP taking these medications   sulfamethoxazole-trimethoprim 800-160 MG tablet Commonly known as:  BACTRIM DS,SEPTRA DS     TAKE these medications   acetaminophen 500 MG tablet Commonly known as:  TYLENOL Take 500 mg by mouth 2 (two) times daily.   amiodarone 200 MG tablet Commonly known as:  PACERONE Take 200 mg by mouth every Monday, Wednesday, and Friday.    aspirin EC 81 MG tablet Take 1 tablet (81 mg total) by mouth daily.   atorvastatin 40 MG tablet Commonly known as:  LIPITOR Take 1 tablet (40 mg total) by mouth daily at 6 PM.   cyanocobalamin 1000 MCG/ML injection Commonly known as:  (VITAMIN B-12) Inject 1,000 mcg into the muscle every 30 (thirty) days.   diazepam 5 MG tablet Commonly known as:  VALIUM Take 0.5 tablets (2.5 mg total) by mouth at bedtime.   divalproex 250 MG DR tablet Commonly known as:  DEPAKOTE Take 1 tablet by mouth 2 (two) times daily.   DULoxetine 60 MG capsule Commonly known as:  CYMBALTA Take 60 mg by mouth every evening.   ferrous sulfate 325 (65 FE) MG tablet Take 325 mg by mouth daily with breakfast.   fluticasone 50 MCG/ACT nasal spray Commonly known as:  FLONASE Place 1 spray into the nose daily as  needed for allergies or rhinitis.   furosemide 40 MG tablet Commonly known as:  LASIX Take 1 tablet (40 mg total) by mouth daily. What changed:  how much to take   levothyroxine 25 MCG tablet Commonly known as:  SYNTHROID, LEVOTHROID Take 25 mcg by mouth daily before breakfast.   metoprolol tartrate 25 MG tablet Commonly known as:  LOPRESSOR Take 1 tablet (25 mg total) by mouth 2 (two) times daily. What changed:    medication strength  how much to take  how to take this  when to take this  additional instructions   pantoprazole 40 MG tablet Commonly known as:  PROTONIX Take 1 tablet (40 mg total) by mouth daily at 6 (six) AM.   potassium chloride 10 MEQ tablet Commonly known as:  K-DUR Take 2 tablets (20 mEq total) by mouth daily.            Durable Medical Equipment  (From admission, onward)        Start     Ordered   08/15/17 1512  DME Oxygen  Once    Question Answer Comment  Mode or (Route) Nasal cannula   Liters per Minute 2   Frequency Continuous (stationary and portable oxygen unit needed)   Oxygen delivery system Gas      08/15/17 1526     Follow-up  Information    Nahser, Wonda Cheng, MD Follow up.   Specialty:  Cardiology Why:  The office will call you to arrange a 1 week visit with one of Dr. Elmarie Shiley PA or NPs Contact information: [address] [place] [address] [phone]        Prince Solian, MD. Schedule an appointment as soon as possible for a visit in 1 week(s).   Specialty:  Internal Medicine Why:  Hospital follow up  Contact information: [address] [place] [address] [phone]          Allergies  Allergen Reactions  . Codeine Other (See Comments)    Hallucinations   . Citalopram Swelling and Rash    Consultations:  Cardiology   Procedures/Studies: Dg Chest 2 View  Result Date: 07/22/2017 CLINICAL DATA:  Hypoxia, cough and shortness of breath. EXAM: CHEST  2 VIEW COMPARISON:  Chest radiograph May 02, 2017 FINDINGS: Cardiac silhouette is mildly enlarged and unchanged. Calcified aortic knob. Moderate hiatal hernia with air-fluid level. Pulmonary vascular congestion. Bibasilar strandy densities. Trace LEFT pleural effusion. No pneumothorax. Dual lead LEFT cardiac pacemaker in situ. No pneumothorax. Osteopenia. Multilevel thoracolumbar vertebroplasty. Likely old LEFT rib fracture. IMPRESSION: Stable cardiomegaly with pulmonary vascular congestion. Bibasilar atelectasis and small LEFT pleural effusion. Moderate hiatal hernia. Aortic Atherosclerosis (ICD10-I70.0). Electronically Signed   By: Elon Alas M.D.   On: 07/22/2017 03:35   Dg Chest Port 1 View  Result Date: 08/12/2017 CLINICAL DATA:  Shortness of breath with exertion. Ongoing for 2-3 weeks. No chest pain. EXAM: PORTABLE CHEST 1 VIEW COMPARISON:  07/21/2017 FINDINGS: There is mild bilateral chronic interstitial thickening. There is no focal parenchymal opacity. There is no pleural effusion or pneumothorax. There is stable cardiomegaly. There is a dual lead cardiac pacemaker. The osseous structures are  unremarkable. IMPRESSION: No active disease. Electronically Signed   By: Kathreen Devoid   On: 08/12/2017 17:48   Echo Carotid Doppler   Discharge Exam: Vitals:   08/15/17 0418 08/15/17 1054  BP: (!) 179/89 (!) 150/93  Pulse: (!) 114 (!) 108  Resp: 20   Temp: (!) 97.5 F (  36.4 C)   SpO2: 95% 100%   Vitals:   08/14/17 1142 08/14/17 1952 08/15/17 0418 08/15/17 1054  BP: (!) 151/73 (!) 180/88 (!) 179/89 (!) 150/93  Pulse: (!) 103 (!) 110 (!) 114 (!) 108  Resp:  20 20   Temp:  98.5 F (36.9 C) (!) 97.5 F (36.4 C)   TempSrc:  Oral Oral   SpO2: 95% 98% 95% 100%  Weight:   92.5 kg (203 lb 14.8 oz)   Height:       General: NAD Cardiovascular: RRR, S1/S2 +, no rubs, no gallops Respiratory: Good air entry, no wheezing no crackles Abdominal: Soft, NT, ND, bowel sounds + Extremities: Trace bilateral lower extremity edema   The results of significant diagnostics from this hospitalization (including imaging, microbiology, ancillary and laboratory) are listed below for reference.     Microbiology: No results found for this or any previous visit (from the past 240 hour(s)).   Labs: BNP (last 3 results) Recent Labs    05/22/17 1130 08/12/17 1741  BNP 1,188.0* 767.3*   Basic Metabolic Panel: Recent Labs  Lab 08/12/17 1800 08/13/17 0500 08/14/17 0612 08/15/17 1005  NA 138 138 136 136  K 5.2* 4.8 4.4 4.2  CL 101 104 105 106  CO2  --  25 23 23   GLUCOSE 108* 95 112* 187*  BUN 44* 45* 33* 20  CREATININE 2.40* 2.45* 1.89* 1.51*  CALCIUM  --  8.2* 8.1* 8.0*   Liver Function Tests: No results for input(s): AST, ALT, ALKPHOS, BILITOT, PROT, ALBUMIN in the last 168 hours. No results for input(s): LIPASE, AMYLASE in the last 168 hours. No results for input(s): AMMONIA in the last 168 hours. CBC: Recent Labs  Lab 08/12/17 1737 08/12/17 1800 08/13/17 0500 08/14/17 0612 08/15/17 1005  WBC 5.8  --  4.6 4.3 5.0  NEUTROABS  --   --   --   --  3.4  HGB 7.8* 8.2* 8.3* 7.6*  8.8*  HCT 24.9* 24.0* 26.6* 24.1* 28.9*  MCV 93.6  --  88.4 88.3 91.2  PLT 190  --  126* 114* 121*   Cardiac Enzymes: Recent Labs  Lab 08/12/17 1737 08/12/17 2302 08/13/17 0500 08/13/17 1126  TROPONINI 0.03* <0.03 <0.03 <0.03   BNP: Invalid input(s): POCBNP CBG: No results for input(s): GLUCAP in the last 168 hours. D-Dimer No results for input(s): DDIMER in the last 72 hours. Hgb A1c No results for input(s): HGBA1C in the last 72 hours. Lipid Profile No results for input(s): CHOL, HDL, LDLCALC, TRIG, CHOLHDL, LDLDIRECT in the last 72 hours. Thyroid function studies No results for input(s): TSH, T4TOTAL, T3FREE, THYROIDAB in the last 72 hours.  Invalid input(s): FREET3 Anemia work up Recent Labs    08/12/17 2302  VITAMINB12 522  FOLATE 15.9  FERRITIN 162  TIBC 283  IRON 33*  RETICCTPCT 2.7   Urinalysis    Component Value Date/Time   COLORURINE COLORLESS (A) 05/22/2017 1130   APPEARANCEUR CLEAR 05/22/2017 1130   LABSPEC 1.005 05/22/2017 1130   PHURINE 6.0 05/22/2017 1130   GLUCOSEU NEGATIVE 05/22/2017 1130   HGBUR SMALL (A) 05/22/2017 1130   BILIRUBINUR NEGATIVE 05/22/2017 1130   KETONESUR NEGATIVE 05/22/2017 1130   PROTEINUR NEGATIVE 05/22/2017 1130   UROBILINOGEN 0.2 03/30/2014 1400   NITRITE NEGATIVE 05/22/2017 1130   LEUKOCYTESUR NEGATIVE 05/22/2017 1130   Sepsis Labs Invalid input(s): PROCALCITONIN,  WBC,  LACTICIDVEN Microbiology No results found for this or any previous visit (from the past 240 hour(s)).  Time coordinating discharge: 35 minutes  SIGNED:  Chipper Oman, MD  [facility] 08/15/2017, 3:26 PM  Pager please text page via  [email or web address] Password [email or web address]

## 2017-08-19 ENCOUNTER — Telehealth: Payer: Self-pay | Admitting: Cardiovascular Disease

## 2017-08-19 DIAGNOSIS — I480 Paroxysmal atrial fibrillation: Secondary | ICD-10-CM | POA: Diagnosis not present

## 2017-08-19 DIAGNOSIS — I5033 Acute on chronic diastolic (congestive) heart failure: Secondary | ICD-10-CM | POA: Diagnosis not present

## 2017-08-19 DIAGNOSIS — N183 Chronic kidney disease, stage 3 (moderate): Secondary | ICD-10-CM | POA: Diagnosis not present

## 2017-08-19 DIAGNOSIS — E1122 Type 2 diabetes mellitus with diabetic chronic kidney disease: Secondary | ICD-10-CM | POA: Diagnosis not present

## 2017-08-19 DIAGNOSIS — F0390 Unspecified dementia without behavioral disturbance: Secondary | ICD-10-CM | POA: Diagnosis not present

## 2017-08-19 DIAGNOSIS — I130 Hypertensive heart and chronic kidney disease with heart failure and stage 1 through stage 4 chronic kidney disease, or unspecified chronic kidney disease: Secondary | ICD-10-CM | POA: Diagnosis not present

## 2017-08-19 NOTE — Telephone Encounter (Signed)
New Message      [facility] 12/28 [place] Nahser

## 2017-08-20 NOTE — Telephone Encounter (Signed)
Patient contacted on 08/20/2017 @ 11:51am Patient understands to follow up with  Provider on  08/21/2017 @ 10:30am with Richard Merle, NP Patient understands discharge instructions? yes Patient understands medications and regimen? yes Patient understands to bring all medications to this visit? Yes.  Spoke with wife, she states Richard [name] is still weak but has improved since prior to hospitalization. She states he is some short of breath. She knows about appointment tomorrow but not sure if she will be able to keep appointment due to rain, they drive from [place], [place]. I instructed her how to call and change appointment if needed. She also wanted to know if someone could help her with wheelchair, I gave her number and instructed her to call registration for assistance in the morning.   Wife appreciates call.

## 2017-08-21 ENCOUNTER — Encounter: Payer: Self-pay | Admitting: Nurse Practitioner

## 2017-08-21 ENCOUNTER — Ambulatory Visit (INDEPENDENT_AMBULATORY_CARE_PROVIDER_SITE_OTHER): Payer: Medicare Other | Admitting: Nurse Practitioner

## 2017-08-21 VITALS — BP 148/70 | HR 91 | Ht 75.0 in | Wt 208.4 lb

## 2017-08-21 DIAGNOSIS — I5022 Chronic systolic (congestive) heart failure: Secondary | ICD-10-CM

## 2017-08-21 DIAGNOSIS — Z79899 Other long term (current) drug therapy: Secondary | ICD-10-CM

## 2017-08-21 NOTE — Patient Instructions (Addendum)
We will be checking the following labs today - BMET, CBC, TSH and BNP   Medication Instructions:    Continue with your current medicines.     Testing/Procedures To Be Arranged:  N/A  Follow-Up:   See Dr. Rayann Heman and Dr. Acie Fredrickson back as planned.     Other Special Instructions:   N/A    If you need a refill on your cardiac medications before your next appointment, please call your pharmacy.   Call the [place] office at [phone] if you have any questions, problems or concerns.

## 2017-08-21 NOTE — Progress Notes (Signed)
CARDIOLOGY OFFICE NOTE  Date:  08/21/2017    [name] Date of Birth: Feb 02, 1931 Medical Record #[id number]  PCP:  Prince Solian, MD  Cardiologist:  Nahser    Chief Complaint  Patient presents with  . Congestive Heart Failure  . Atrial Fibrillation    Post hospital visit - seen for Dr. Acie Fredrickson    History of Present Illness: TALLIS [name] is a 83 y.o. male who presents today for a post hospital visit. Seen for Dr. Acie Fredrickson.   He has a history of diabetes mellitus, hypertension, stroke, heart failure, CKD, A. fib and pacemaker.   Seen earlier this month by Dr. Acie Fredrickson - noted general decline. Lasix was cut back.   Presented to the emergency department on 12/19 after being seen by his PCP and while getting on that weight scale got dizzy and passed out.  Syncopal episode had been occurring prior to admission after Lasix dose was increased for fluid overload. Patient was admitted with working diagnosis of syncope, felt to be secondary to dehydration  from Lasix.  Noted to be anemic. Was transfused. Noted worsening kidney function which improved with hydration and med changes.   Comes in today. Here with his wife. She looks pretty tired. He says he is doing ok. No more spells. Has not seen PCP yet. No active bleeding that she is aware of - he is on iron. Noted some coughing today. Weight looks to be up. He says he is not short of breath - she feels like he might be.  No chest pain. He is asking why he did not get to sign up for unemployment after he retired. He is quite sedentary. [place] twice prior to last admission - none since.   Past Medical History:  Diagnosis Date  . Anxiety   . Arthritis   . Atrial fibrillation ([place])    a. Dx 03/2013, notes report atrial fibrillation/atrial flutter, placed on amiodarone. NSR in subsequent OV's.  . B12 deficiency anemia   . Cancer ([place])   . Chest pain    a. H/o CTA negative for PE 2012, normal cath 2005, normal nucs previously  including 05/2012.  Marland Kitchen Complete heart block (HCC)    a. s/p Medtronic Adapta L model ADDRL 1 (serial number [id number]) pacemaker.  . Dementia   . GERD (gastroesophageal reflux disease)   . Hiatal hernia   . Hyperlipidemia   . Hypertension   . Iron deficiency anemia   . LBBB (left bundle branch block)   . Microcytic anemia   . Nephrolithiasis   . On home oxygen therapy    a. 2L w/CPAP at night  . Orthostatic hypotension   . OSA on CPAP    setting = 4  . Pacemaker   . Rocky Mountain spotted fever ~ 1945  . Sinus drainage   . Stroke ([place]) 05/2016  . Type II diabetes mellitus ([place])     Past Surgical History:  Procedure Laterality Date  . CARDIAC CATHETERIZATION     by Dr. Acie Fredrickson, January 24, 2004, that shows minimal coronary artery irregularities and normal left ventricular function  . CATARACT EXTRACTION W/ INTRAOCULAR LENS  IMPLANT, BILATERAL Bilateral   . COLONOSCOPY WITH PROPOFOL N/A 05/23/2016   Procedure: COLONOSCOPY WITH PROPOFOL;  Surgeon: Jerene Bears, MD;  Location: [facility];  Service: Gastroenterology;  Laterality: N/A;  . ENTEROSCOPY N/A 06/02/2016   Procedure: ENTEROSCOPY;  Surgeon: Gatha Mayer, MD;  Location: [facility] ENDOSCOPY;  Service: Endoscopy;  Laterality: N/A;  . ESOPHAGOGASTRODUODENOSCOPY N/A 05/22/2016   Procedure: ESOPHAGOGASTRODUODENOSCOPY (EGD);  Surgeon: Jerene Bears, MD;  [facility] ENDOSCOPY;  Service: Endoscopy;  Laterality: N/A;  . ESOPHAGOGASTRODUODENOSCOPY (EGD) WITH PROPOFOL N/A 06/21/2015   Procedure: ESOPHAGOGASTRODUODENOSCOPY (EGD) WITH PROPOFOL;  Surgeon: Gatha Mayer, MD;  Location: [facility] ENDOSCOPY;  Service: Endoscopy;  Laterality: N/A;  . GIVENS CAPSULE STUDY N/A 05/23/2016   Procedure: GIVENS CAPSULE STUDY;  Surgeon: Jerene Bears, MD;  Location: [facility];  Service: Gastroenterology;  Laterality: N/A;  . HOT HEMOSTASIS N/A 06/02/2016   Procedure: HOT HEMOSTASIS (ARGON PLASMA COAGULATION/BICAP);  Surgeon: Gatha Mayer, MD;  Location: [facility]  ENDOS[facility];  Service: Endoscopy;  Laterality: N/A;  . INGUINAL HERNIA REPAIR Right   . IR RADIOLOGIST EVAL & MGMT  11/25/2016  . IR RADIOLOGIST EVAL & MGMT  01/22/2017  . IR VERTEBROPLASTY CERV/THOR BX INC UNI/BIL INC/INJECT/IMAGING  01/30/2017  . IR VERTEBROPLASTY EA ADDL (T&LS) BX INC UNI/BIL INC INJECT/IMAGING  11/28/2016  . IR VERTEBROPLASTY EA ADDL (T&LS) BX INC UNI/BIL INC INJECT/IMAGING  01/30/2017  . IR VERTEBROPLASTY LUMBAR BX INC UNI/BIL INC/INJECT/IMAGING  11/28/2016  . LUMBAR DISC SURGERY  ~ 1993  . PERMANENT PACEMAKER INSERTION N/A 06/03/2014   MDT Adapta L implanted by Dr Rayann Heman for syncope and transient AV block  . SKIN CANCER EXCISION     "lower lip" (04/06/2013)  . TEMPORARY PACEMAKER INSERTION N/A 05/31/2014   Procedure: TEMPORARY WIRE;  Surgeon: Sinclair Grooms, MD;  Location: [facility] CATH LAB;  Service: Cardiovascular;  Laterality: N/A;  . VASECTOMY     Hx of      Medications: Current Meds  Medication Sig  . acetaminophen (TYLENOL) 500 MG tablet Take 500 mg by mouth 2 (two) times daily.   Marland Kitchen amiodarone (PACERONE) 200 MG tablet Take 200 mg by mouth every Monday, Wednesday, and Friday.   Marland Kitchen aspirin EC 81 MG tablet Take 1 tablet (81 mg total) by mouth daily.  Marland Kitchen atorvastatin (LIPITOR) 40 MG tablet Take 1 tablet (40 mg total) by mouth daily at 6 PM.  . cyanocobalamin (,VITAMIN B-12,) 1000 MCG/ML injection Inject 1,000 mcg into the muscle every 30 (thirty) days.  . diazepam (VALIUM) 5 MG tablet Take 0.5 tablets (2.5 mg total) by mouth at bedtime.  . divalproex (DEPAKOTE) 250 MG DR tablet Take 1 tablet by mouth 2 (two) times daily.  . DULoxetine (CYMBALTA) 60 MG capsule Take 60 mg by mouth every evening.  . ferrous sulfate 325 (65 FE) MG tablet Take 325 mg by mouth daily with breakfast.  . fluticasone (FLONASE) 50 MCG/ACT nasal spray Place 1 spray into the nose daily as needed for allergies or rhinitis.   . furosemide (LASIX) 40 MG tablet Take 1 tablet (40 mg total) by mouth daily.  Marland Kitchen  levothyroxine (SYNTHROID, LEVOTHROID) 25 MCG tablet Take 25 mcg by mouth daily before breakfast.  . metoprolol tartrate (LOPRESSOR) 25 MG tablet Take 1 tablet (25 mg total) by mouth 2 (two) times daily.  . pantoprazole (PROTONIX) 40 MG tablet Take 1 tablet (40 mg total) by mouth daily at 6 (six) AM.  . potassium chloride (K-DUR) 10 MEQ tablet Take 2 tablets (20 mEq total) by mouth daily.     Allergies: Allergies  Allergen Reactions  . Codeine Other (See Comments)    Hallucinations   . Citalopram Swelling and Rash    Social History: The patient  reports that he has quit smoking. His smoking use included cigarettes. He smoked 0.00 packs  per day for 20.00 years. he has never used smokeless tobacco. He reports that he does not drink alcohol or use drugs.   Family History: The patient's family history includes Breast cancer in his sister; Other in his unknown relative; Pneumonia in his father; Stroke in his brother; Thyroid disease in his mother.   Review of Systems: Please see the history of present illness.   Otherwise, the review of systems is positive for none.   All other systems are reviewed and negative.   Physical Exam: VS:  BP (!) 148/70 (BP Location: Left Arm, Patient Position: Sitting, Cuff Size: Normal)   Pulse 91   Ht 6\' 3"  (1.905 m)   Wt 208 lb 6.4 oz (94.5 kg)   SpO2 95% Comment: at rest  BMI 26.05 kg/m  .  BMI Body mass index is 26.05 kg/m.  Wt Readings from Last 3 Encounters:  08/21/17 208 lb 6.4 oz (94.5 kg)  08/15/17 203 lb 14.8 oz (92.5 kg)  07/30/17 192 lb 6.4 oz (87.3 kg)    General: Elderly male. Chronically ill appearing. Color quite sallow. Alert and in no acute distress.   HEENT: Normal.  Neck: Supple, no JVD, carotid bruits, or masses noted.  Cardiac: Regular rate and rhythm. Systolic murmur. Trace significant edema.  Respiratory:  Lungs are clear to auscultation - few faint rales bilaterally with normal work of breathing.  GI: Soft and nontender.    MS: No deformity or atrophy. Gait not tested.  Skin: Warm and dry. Color is normal.  Neuro:  Strength and sensation are intact and no gross focal deficits noted.  Psych: Alert, appropriate and with normal affect.   LABORATORY DATA:  EKG:  EKG is not ordered today.  Lab Results  Component Value Date   WBC 5.0 08/15/2017   HGB 8.8 (L) 08/15/2017   HCT 28.9 (L) 08/15/2017   PLT 121 (L) 08/15/2017   GLUCOSE 187 (H) 08/15/2017   CHOL 136 05/06/2016   TRIG 70 05/06/2016   HDL 38 (L) 05/06/2016   LDLCALC 84 05/06/2016   ALT 22 07/30/2017   AST 34 07/30/2017   NA 136 08/15/2017   K 4.2 08/15/2017   CL 106 08/15/2017   CREATININE 1.51 (H) 08/15/2017   BUN 20 08/15/2017   CO2 23 08/15/2017   TSH 3.021 05/22/2017   INR 1.44 03/02/2017   HGBA1C 5.7 (H) 05/06/2016     BNP (last 3 results) Recent Labs    05/22/17 1130 08/12/17 1741  BNP 1,188.0* 392.0*    ProBNP (last 3 results) Recent Labs    07/21/17 1235  PROBNP 13,733*     Other Studies Reviewed Today:  Echo Study Conclusions 07/2017  - Left ventricle: The cavity size was normal. There was severe   hypertrophy of the septum with otherwise moderate concentric   hypertrophy. Systolic function was mildly reduced. The estimated   ejection fraction was in the range of 45% to 50%. Hypokinesis of   the anteroseptal, inferior, and inferoseptal myocardium. Doppler   parameters are consistent with a reversible restrictive pattern,   indicative of decreased left ventricular diastolic compliance   and/or increased left atrial pressure (grade 3 diastolic   dysfunction). Doppler parameters are consistent with high   ventricular filling pressure. - Ventricular septum: Septal motion showed abnormal function and   dyssynergy consistent with ventricular pacing. - Aortic valve: Transvalvular velocity was within the normal range.   There was no stenosis. There was no regurgitation. - Mitral valve: Transvalvular velocity  was  within the normal range.   There was no evidence for stenosis. There was no regurgitation. - Left atrium: The atrium was severely dilated. - Right ventricle: The cavity size was normal. Wall thickness was   normal. - Tricuspid valve: There was no regurgitation.   Assessment/Plan:  1. Recent syncope - felt to be due to over diuresis/anemia - his medicines have been adjusted. Needs Bp to run higher in the lying position to avoid worsening orthostatic symptoms with standing - no changes made today.   2. Worsening CKD - recheck lab today.   3. Chronic diastolic HF - weight is up - this is questionable - check lab today.   4. Anemia - required transfusion - rechecking lab today. Unclear source.   5. General decline - probably needs palliative/hospice consult - this was my first meeting with them - I will defer to his primary cardiology team.   6. Frequent falls  7. PAF - not a candidate for anticoagulation - remains on amiodarone - Needs TSh today.   8. Underlying PPM - followed by Dr. Rayann Heman   Current medicines are reviewed with the patient today.  The patient does not have concerns regarding medicines other than what has been noted above.  The following changes have been made:  See above.  Labs/ tests ordered today include:    Orders Placed This Encounter  Procedures  . Basic metabolic panel  . CBC  . Pro b natriuretic peptide (BNP)  . TSH     Disposition:   FU with Dr. Acie Fredrickson and Dr. Rayann Heman as planned.  Overall prognosis quite poor in my opinion.   Patient is agreeable to this plan and will call if any problems develop in the interim.   SignedTruitt Merle, NP  08/21/2017 10:16 AM  [facility] [address] [place], [place]  [address] Phone: [phone] Fax: [phone]

## 2017-08-22 LAB — BASIC METABOLIC PANEL
BUN/Creatinine Ratio: 20 (ref 10–24)
BUN: 23 mg/dL (ref 8–27)
CO2: 23 mmol/L (ref 20–29)
Calcium: 8.6 mg/dL (ref 8.6–10.2)
Chloride: 103 mmol/L (ref 96–106)
Creatinine, Ser: 1.15 mg/dL (ref 0.76–1.27)
GFR calc Af Amer: 67 mL/min/{1.73_m2} (ref >59)
GFR calc non Af Amer: 58 mL/min/{1.73_m2} — ABNORMAL LOW (ref >59)
Glucose: 116 mg/dL — ABNORMAL HIGH (ref 65–99)
Potassium: 4.6 mmol/L (ref 3.5–5.2)
Sodium: 140 mmol/L (ref 134–144)

## 2017-08-22 LAB — CBC
Hematocrit: 25.1 % — ABNORMAL LOW (ref 37.5–51.0)
Hemoglobin: 7.8 g/dL — ABNORMAL LOW (ref 13.0–17.7)
MCH: 29 pg (ref 26.6–33.0)
MCHC: 31.1 g/dL — ABNORMAL LOW (ref 31.5–35.7)
MCV: 93 fL (ref 79–97)
Platelets: 169 10*3/uL (ref 150–379)
RBC: 2.69 x10E6/uL — CL (ref 4.14–5.80)
RDW: 22 % — ABNORMAL HIGH (ref 12.3–15.4)
WBC: 5 10*3/uL (ref 3.4–10.8)

## 2017-08-22 LAB — TSH: TSH: 7.88 u[IU]/mL — ABNORMAL HIGH (ref 0.450–4.500)

## 2017-08-22 LAB — PRO B NATRIURETIC PEPTIDE: NT-Pro BNP: 8038 pg/mL — ABNORMAL HIGH (ref 0–486)

## 2017-08-24 ENCOUNTER — Encounter (HOSPITAL_COMMUNITY): Payer: Self-pay | Admitting: *Deleted

## 2017-08-24 ENCOUNTER — Emergency Department (HOSPITAL_COMMUNITY)
Admission: EM | Admit: 2017-08-24 | Discharge: 2017-08-25 | Disposition: A | Discharge status: Home / Self Care | Payer: Medicare Other | Attending: Emergency Medicine | Admitting: Emergency Medicine

## 2017-08-24 ENCOUNTER — Other Ambulatory Visit: Payer: Self-pay

## 2017-08-24 DIAGNOSIS — N183 Chronic kidney disease, stage 3 (moderate): Secondary | ICD-10-CM | POA: Diagnosis not present

## 2017-08-24 DIAGNOSIS — Z950 Presence of cardiac pacemaker: Secondary | ICD-10-CM | POA: Insufficient documentation

## 2017-08-24 DIAGNOSIS — Z7982 Long term (current) use of aspirin: Secondary | ICD-10-CM | POA: Diagnosis not present

## 2017-08-24 DIAGNOSIS — I110 Hypertensive heart disease with heart failure: Secondary | ICD-10-CM | POA: Insufficient documentation

## 2017-08-24 DIAGNOSIS — D649 Anemia, unspecified: Secondary | ICD-10-CM | POA: Insufficient documentation

## 2017-08-24 DIAGNOSIS — Z85828 Personal history of other malignant neoplasm of skin: Secondary | ICD-10-CM | POA: Diagnosis not present

## 2017-08-24 DIAGNOSIS — E1122 Type 2 diabetes mellitus with diabetic chronic kidney disease: Secondary | ICD-10-CM | POA: Diagnosis not present

## 2017-08-24 DIAGNOSIS — Z8673 Personal history of transient ischemic attack (TIA), and cerebral infarction without residual deficits: Secondary | ICD-10-CM | POA: Diagnosis not present

## 2017-08-24 DIAGNOSIS — I130 Hypertensive heart and chronic kidney disease with heart failure and stage 1 through stage 4 chronic kidney disease, or unspecified chronic kidney disease: Secondary | ICD-10-CM | POA: Diagnosis not present

## 2017-08-24 DIAGNOSIS — I5032 Chronic diastolic (congestive) heart failure: Secondary | ICD-10-CM | POA: Insufficient documentation

## 2017-08-24 DIAGNOSIS — Z79899 Other long term (current) drug therapy: Secondary | ICD-10-CM | POA: Diagnosis not present

## 2017-08-24 DIAGNOSIS — I129 Hypertensive chronic kidney disease with stage 1 through stage 4 chronic kidney disease, or unspecified chronic kidney disease: Secondary | ICD-10-CM | POA: Diagnosis not present

## 2017-08-24 DIAGNOSIS — I5033 Acute on chronic diastolic (congestive) heart failure: Secondary | ICD-10-CM | POA: Diagnosis not present

## 2017-08-24 DIAGNOSIS — I480 Paroxysmal atrial fibrillation: Secondary | ICD-10-CM | POA: Diagnosis not present

## 2017-08-24 DIAGNOSIS — Z87891 Personal history of nicotine dependence: Secondary | ICD-10-CM | POA: Diagnosis not present

## 2017-08-24 DIAGNOSIS — F0390 Unspecified dementia without behavioral disturbance: Secondary | ICD-10-CM | POA: Diagnosis not present

## 2017-08-24 LAB — COMPREHENSIVE METABOLIC PANEL
ALK PHOS: 104 U/L (ref 38–126)
ALT: 18 U/L (ref 17–63)
ANION GAP: 7 (ref 5–15)
AST: 33 U/L (ref 15–41)
Albumin: 2.5 g/dL — ABNORMAL LOW (ref 3.5–5.0)
BILIRUBIN TOTAL: 0.7 mg/dL (ref 0.3–1.2)
BUN: 19 mg/dL (ref 6–20)
CALCIUM: 8.4 mg/dL — AB (ref 8.9–10.3)
CO2: 26 mmol/L (ref 22–32)
Chloride: 107 mmol/L (ref 101–111)
Creatinine, Ser: 1.24 mg/dL (ref 0.61–1.24)
GFR, EST AFRICAN AMERICAN: 60 mL/min — AB (ref >60)
GFR, EST NON AFRICAN AMERICAN: 52 mL/min — AB (ref >60)
GLUCOSE: 127 mg/dL — AB (ref 65–99)
POTASSIUM: 4.4 mmol/L (ref 3.5–5.1)
Sodium: 140 mmol/L (ref 135–145)
TOTAL PROTEIN: 6.9 g/dL (ref 6.5–8.1)

## 2017-08-24 LAB — TYPE AND SCREEN
ABO/RH(D): O POS
ANTIBODY SCREEN: NEGATIVE

## 2017-08-24 LAB — CBC
HEMATOCRIT: 28.3 % — AB (ref 39.0–52.0)
HEMOGLOBIN: 8.4 g/dL — AB (ref 13.0–17.0)
MCH: 28.5 pg (ref 26.0–34.0)
MCHC: 29.7 g/dL — AB (ref 30.0–36.0)
MCV: 95.9 fL (ref 78.0–100.0)
Platelets: 199 10*3/uL (ref 150–400)
RBC: 2.95 MIL/uL — ABNORMAL LOW (ref 4.22–5.81)
RDW: 24.4 % — ABNORMAL HIGH (ref 11.5–15.5)
WBC: 4.5 10*3/uL (ref 4.0–10.5)

## 2017-08-24 LAB — CBG MONITORING, ED: Glucose-Capillary: 104 mg/dL — ABNORMAL HIGH (ref 65–99)

## 2017-08-24 NOTE — ED Triage Notes (Signed)
Pt and family member reports recent hospitalization requiring blood transfusion. Pt went to followup appt yesterday and had bloodwork done. Called today to come here due to hgb 7.8. Pt has no complaints.

## 2017-08-25 LAB — POC OCCULT BLOOD, ED: FECAL OCCULT BLD: NEGATIVE

## 2017-08-25 NOTE — Discharge Instructions (Signed)
Return here as needed. Follow up with your doctor. °

## 2017-08-25 NOTE — ED Provider Notes (Signed)
[facility] EMERGENCY DEPARTMENT Provider Note   CSN: [id number] Arrival date & time: 08/24/17  1345     History   Chief Complaint Chief Complaint  Patient presents with  . Abnormal Lab    HPI Richard [name] is a 84 y.o. male.  HPI Patient presents to the emergency department because his doctor stated that he needed to come get checked out due to the fact that he is low hemoglobin.  Patient is not having any symptoms at this time.  Patient states that he recently had a blood transfusion while being admitted for weakness.  The patient denies chest pain, shortness of breath, headache,blurred vision, neck pain, fever, cough, weakness, numbness, dizziness, anorexia, edema, abdominal pain, nausea, vomiting, diarrhea, rash, back pain, dysuria, hematemesis, bloody stool, near syncope, or syncope. Past Medical History:  Diagnosis Date  . Anxiety   . Arthritis   . Atrial fibrillation ([place])    a. Dx 03/2013, notes report atrial fibrillation/atrial flutter, placed on amiodarone. NSR in subsequent OV's.  . B12 deficiency anemia   . Cancer ([place])   . Chest pain    a. H/o CTA negative for PE 2012, normal cath 2005, normal nucs previously including 05/2012.  Marland Kitchen Complete heart block (HCC)    a. s/p Medtronic Adapta L model ADDRL 1 (serial number [id number]) pacemaker.  . Dementia   . GERD (gastroesophageal reflux disease)   . Hiatal hernia   . Hyperlipidemia   . Hypertension   . Iron deficiency anemia   . LBBB (left bundle branch block)   . Microcytic anemia   . Nephrolithiasis   . On home oxygen therapy    a. 2L w/CPAP at night  . Orthostatic hypotension   . OSA on CPAP    setting = 4  . Pacemaker   . Rocky Mountain spotted fever ~ 1945  . Sinus drainage   . Stroke ([place]) 05/2016  . Type II diabetes mellitus [facility])     Patient Active Problem List   Diagnosis Date Noted  . Acute on chronic diastolic CHF (congestive heart failure) ([place]) 05/22/2017  . CKD  (chronic kidney disease) stage 3, GFR 30-59 ml/min (HCC) 05/22/2017  . Acute respiratory failure with hypoxia ([place]) 05/22/2017  . Intracranial hemorrhage ([place]) 03/03/2017  . Head trauma 03/02/2017  . Chronic diastolic CHF (congestive heart failure) ([place]) 11/16/2016  . AVM (arteriovenous malformation) of small bowel, acquired ([place])   . Abnormality of gait as late effect of stroke 05/27/2016  . Benign neoplasm of cecum   . Benign neoplasm of sigmoid colon   . GI bleeding 05/22/2016  . Melena   . GIB (gastrointestinal bleeding) 05/21/2016  . Diabetes mellitus with complication ([place])   . Upper GI bleed   . CKD (chronic kidney disease)   . Type 2 diabetes mellitus with circulatory disorder, without long-term current use of insulin ([place])   . Acute right PCA stroke ([place]) 05/08/2016  . Ataxia due to recent stroke   . Gait disturbance, post-stroke   . Cerebellar stroke, acute ([place]) 05/07/2016  . Hyperlipidemia   . Chronic anticoagulation   . Cardiac pacemaker in situ   . Acute blood loss anemia   . Cognitive deficits   . Cognitive deficit due to recent stroke   . Stroke-related cognitive dysfunction   . Generalized weakness 05/05/2016  . Dizziness   . Absolute anemia 02/08/2016  . Weakness 02/01/2016  . Gastric polyp 06/21/2015  . Iron deficiency anemia due to chronic  blood loss   . GERD (gastroesophageal reflux disease)   . B12 deficiency anemia   . Bradycardia with less than 30 beats per minute 05/31/2014  . Complete heart block ([place]) 05/31/2014  . Syncope 05/31/2014  . Anemia 05/31/2014  . Atrial fibrillation ([place]) 04/08/2013  . Diabetes mellitus ([place]) 04/06/2013  . Hiatal hernia 04/06/2013  . Essential hypertension 06/13/2011  . OSA (obstructive sleep apnea) 06/13/2011    Past Surgical History:  Procedure Laterality Date  . CARDIAC CATHETERIZATION     by Dr. Acie Fredrickson, January 24, 2004, that shows minimal coronary artery irregularities and normal left ventricular function  .  CATARACT EXTRACTION W/ INTRAOCULAR LENS  IMPLANT, BILATERAL Bilateral   . COLONOSCOPY WITH PROPOFOL N/A 05/23/2016   Procedure: COLONOSCOPY WITH PROPOFOL;  Surgeon: Jerene Bears, MD;  Location: [facility];  Service: Gastroenterology;  Laterality: N/A;  . ENTEROSCOPY N/A 06/02/2016   Procedure: ENTEROSCOPY;  Surgeon: Gatha Mayer, MD;  [facility] ENDOSCOPY;  Service: Endoscopy;  Laterality: N/A;  . ESOPHAGOGASTRODUODENOSCOPY N/A 05/22/2016   Procedure: ESOPHAGOGASTRODUODENOSCOPY (EGD);  Surgeon: Jerene Bears, MD;  Location: [facility] ENDOSCOPY;  Service: Endoscopy;  Laterality: N/A;  . ESOPHAGOGASTRODUODENOSCOPY (EGD) WITH PROPOFOL N/A 06/21/2015   Procedure: ESOPHAGOGASTRODUODENOSCOPY (EGD) WITH PROPOFOL;  Surgeon: Gatha Mayer, MD;  [facility] ENDOSCOPY;  Service: Endoscopy;  Laterality: N/A;  . GIVENS CAPSULE STUDY N/A 05/23/2016   Procedure: GIVENS CAPSULE STUDY;  Surgeon: Jerene Bears, MD;  Location: [facility];  Service: Gastroenterology;  Laterality: N/A;  . HOT HEMOSTASIS N/A 06/02/2016   Procedure: HOT HEMOSTASIS (ARGON PLASMA COAGULATION/BICAP);  Surgeon: Gatha Mayer, MD;  Location[facility];  Service: Endoscopy;  Laterality: N/A;  . INGUINAL HERNIA REPAIR Right   . IR RADIOLOGIST EVAL & MGMT  11/25/2016  . IR RADIOLOGIST EVAL & MGMT  01/22/2017  . IR VERTEBROPLASTY CERV/THOR BX INC UNI/BIL INC/INJECT/IMAGING  01/30/2017  . IR VERTEBROPLASTY EA ADDL (T&LS) BX INC UNI/BIL INC INJECT/IMAGING  11/28/2016  . IR VERTEBROPLASTY EA ADDL (T&LS) BX INC UNI/BIL INC INJECT/IMAGING  01/30/2017  . IR VERTEBROPLASTY LUMBAR BX INC UNI/BIL INC/INJECT/IMAGING  11/28/2016  . LUMBAR DISC SURGERY  ~ 1993  . PERMANENT PACEMAKER INSERTION N/A 06/03/2014   MDT Adapta L implanted by Dr Rayann Heman for syncope and transient AV block  . SKIN CANCER EXCISION     "lower lip" (04/06/2013)  . TEMPORARY PACEMAKER INSERTION N/A 05/31/2014   Procedure: TEMPORARY WIRE;  Surgeon: Sinclair Grooms, MD;  Location: [facility] CATH LAB;   Service: Cardiovascular;  Laterality: N/A;  . VASECTOMY     Hx of     OB History    No data available       Home Medications    Prior to Admission medications   Medication Sig Start Date End Date Taking? Authorizing Provider  acetaminophen (TYLENOL) 500 MG tablet Take 500 mg by mouth 2 (two) times daily.    Yes [provider]  amiodarone (PACERONE) 200 MG tablet Take 200 mg by mouth every Monday, Wednesday, and Friday.    Yes [provider]  aspirin EC 81 MG tablet Take 1 tablet (81 mg total) by mouth daily. 05/26/17  Yes Tat, Shanon Brow, MD  atorvastatin (LIPITOR) 40 MG tablet Take 1 tablet (40 mg total) by mouth daily at 6 PM. 05/16/16  Yes Love, Ivan Anchors, PA-C  cyanocobalamin (,VITAMIN B-12,) 1000 MCG/ML injection Inject 1,000 mcg into the muscle every 30 (thirty) days.   Yes [provider]  diazepam (  VALIUM) 5 MG tablet Take 0.5 tablets (2.5 mg total) by mouth at bedtime. 05/25/17  Yes Tat, Shanon Brow, MD  divalproex (DEPAKOTE) 250 MG DR tablet Take 1 tablet by mouth 2 (two) times daily. 05/13/17  Yes [provider]  DULoxetine (CYMBALTA) 60 MG capsule Take 60 mg by mouth every evening.   Yes [provider]  ferrous sulfate 325 (65 FE) MG tablet Take 325 mg by mouth 2 (two) times daily with a meal.    Yes [provider]  fluticasone (FLONASE) 50 MCG/ACT nasal spray Place 1 spray into the nose daily as needed for allergies or rhinitis.    Yes [provider]  furosemide (LASIX) 40 MG tablet Take 1 tablet (40 mg total) by mouth daily. 08/15/17 11/13/17 Yes Doreatha Lew, MD  levothyroxine (SYNTHROID, LEVOTHROID) 25 MCG tablet Take 25 mcg by mouth daily before breakfast.   Yes [provider]  metoprolol tartrate (LOPRESSOR) 25 MG tablet Take 1 tablet (25 mg total) by mouth 2 (two) times daily. 08/15/17  Yes Patrecia Pour, Christean Grief, MD  pantoprazole (PROTONIX) 40 MG tablet Take 1 tablet (40 mg total) by mouth daily at 6  (six) AM. 05/25/16  Yes Kelvin Cellar, MD  potassium chloride (K-DUR) 10 MEQ tablet Take 2 tablets (20 mEq total) by mouth daily. 07/30/17 10/28/17 Yes Nahser, Wonda Cheng, MD    Family History Family History  Problem Relation Age of Onset  . Thyroid disease Mother        goiter  . Pneumonia Father   . Other Unknown        Parents both died of old age; other conditions not known  . Stroke Brother   . Breast cancer Sister     Social History Social History   Tobacco Use  . Smoking status: Former Smoker    Packs/day: 0.00    Years: 20.00    Pack years: 0.00    Types: Cigarettes  . Smokeless tobacco: Never Used  . Tobacco comment: 04/06/2013 "quit smoking years ago; didn't smoke that much; probably 20 years; 1ppd"  Substance Use Topics  . Alcohol use: No  . Drug use: No     Allergies   Codeine and Citalopram   Review of Systems Review of Systems  All other systems negative except as documented in the HPI. All pertinent positives and negatives as reviewed in the HPI. Physical Exam Updated Vital Signs BP (!) 155/75   Pulse 75   Temp 97.7 F (36.5 C) (Oral)   Resp 16   SpO2 96%   Physical Exam  Constitutional: He is oriented to person, place, and time. He appears well-developed and well-nourished. No distress.  HENT:  Head: Normocephalic and atraumatic.  Mouth/Throat: Oropharynx is clear and moist.  Eyes: Pupils are equal, round, and reactive to light.  Neck: Normal range of motion. Neck supple.  Cardiovascular: Normal rate, regular rhythm and normal heart sounds. Exam reveals no gallop and no friction rub.  No murmur heard. Pulmonary/Chest: Effort normal and breath sounds normal. No respiratory distress. He has no wheezes.  Genitourinary: Rectal exam shows guaiac negative stool.  Neurological: He is alert and oriented to person, place, and time. He exhibits normal muscle tone. Coordination normal.  Skin: Skin is warm and dry. Capillary refill takes less than 2  seconds. No rash noted. No erythema.  Psychiatric: He has a normal mood and affect. His behavior is normal.  Nursing note and vitals reviewed.    ED Treatments / Results  Labs (all labs ordered are listed, but only abnormal results are displayed) Labs Reviewed  COMPREHENSIVE METABOLIC PANEL - Abnormal; Notable for the following components:      Result Value   Glucose, Bld 127 (*)    Calcium 8.4 (*)    Albumin 2.5 (*)    GFR calc non Af Amer 52 (*)    GFR calc Af Amer 60 (*)    All other components within normal limits  CBC - Abnormal; Notable for the following components:   RBC 2.95 (*)    Hemoglobin 8.4 (*)    HCT 28.3 (*)    MCHC 29.7 (*)    RDW 24.4 (*)    All other components within normal limits  CBG MONITORING, ED - Abnormal; Notable for the following components:   Glucose-Capillary 104 (*)    All other components within normal limits  POC OCCULT BLOOD, ED  TYPE AND SCREEN    EKG  EKG Interpretation None       Radiology No results found.  Procedures Procedures (including critical care time)  Medications Ordered in ED Medications - No data to display   Initial Impression / Assessment and Plan / ED Course  I have reviewed the triage vital signs and the nursing notes.  Pertinent labs & imaging results that were available during my care of the patient were reviewed by me and considered in my medical decision making (see chart for details).     At this time the patient's hemoglobin is stable and will have him follow-up with his primary doctor.  The patient's vital signs have been stable as well.  Patient is having no symptoms at this time.  Patient is advised to return here for any worsening in his condition. Final Clinical Impressions(s) / ED Diagnoses   Final diagnoses:  None    ED Discharge Orders    None       Dalia Heading, PA-C 08/25/17 0056    Ward, Delice Bison, DO 08/25/17 [id number]

## 2017-08-27 DIAGNOSIS — I10 Essential (primary) hypertension: Secondary | ICD-10-CM | POA: Diagnosis not present

## 2017-08-27 DIAGNOSIS — I480 Paroxysmal atrial fibrillation: Secondary | ICD-10-CM | POA: Diagnosis not present

## 2017-08-27 DIAGNOSIS — D6489 Other specified anemias: Secondary | ICD-10-CM | POA: Diagnosis not present

## 2017-08-27 DIAGNOSIS — Z6826 Body mass index (BMI) 26.0-26.9, adult: Secondary | ICD-10-CM | POA: Diagnosis not present

## 2017-08-27 DIAGNOSIS — M069 Rheumatoid arthritis, unspecified: Secondary | ICD-10-CM | POA: Diagnosis not present

## 2017-08-27 DIAGNOSIS — E038 Other specified hypothyroidism: Secondary | ICD-10-CM | POA: Diagnosis not present

## 2017-08-27 DIAGNOSIS — I509 Heart failure, unspecified: Secondary | ICD-10-CM | POA: Diagnosis not present

## 2017-08-27 DIAGNOSIS — R0609 Other forms of dyspnea: Secondary | ICD-10-CM | POA: Diagnosis not present

## 2017-08-27 DIAGNOSIS — J9601 Acute respiratory failure with hypoxia: Secondary | ICD-10-CM | POA: Diagnosis not present

## 2017-08-27 DIAGNOSIS — I872 Venous insufficiency (chronic) (peripheral): Secondary | ICD-10-CM | POA: Diagnosis not present

## 2017-08-27 DIAGNOSIS — E1122 Type 2 diabetes mellitus with diabetic chronic kidney disease: Secondary | ICD-10-CM | POA: Diagnosis not present

## 2017-08-27 DIAGNOSIS — N183 Chronic kidney disease, stage 3 (moderate): Secondary | ICD-10-CM | POA: Diagnosis not present

## 2017-08-28 ENCOUNTER — Emergency Department (HOSPITAL_COMMUNITY): Payer: Medicare Other

## 2017-08-28 ENCOUNTER — Encounter (HOSPITAL_COMMUNITY): Payer: Self-pay

## 2017-08-28 ENCOUNTER — Inpatient Hospital Stay (HOSPITAL_COMMUNITY)
Admission: EM | Admit: 2017-08-28 | Discharge: 2017-09-03 | DRG: 291 | Disposition: A | Discharge status: Hospice | Payer: Medicare Other | Attending: Internal Medicine | Admitting: Internal Medicine

## 2017-08-28 ENCOUNTER — Other Ambulatory Visit: Payer: Self-pay

## 2017-08-28 DIAGNOSIS — Z87891 Personal history of nicotine dependence: Secondary | ICD-10-CM | POA: Diagnosis not present

## 2017-08-28 DIAGNOSIS — R32 Unspecified urinary incontinence: Secondary | ICD-10-CM | POA: Diagnosis present

## 2017-08-28 DIAGNOSIS — K558 Other vascular disorders of intestine: Secondary | ICD-10-CM

## 2017-08-28 DIAGNOSIS — K219 Gastro-esophageal reflux disease without esophagitis: Secondary | ICD-10-CM | POA: Diagnosis present

## 2017-08-28 DIAGNOSIS — Z7401 Bed confinement status: Secondary | ICD-10-CM | POA: Diagnosis not present

## 2017-08-28 DIAGNOSIS — I5021 Acute systolic (congestive) heart failure: Secondary | ICD-10-CM

## 2017-08-28 DIAGNOSIS — I130 Hypertensive heart and chronic kidney disease with heart failure and stage 1 through stage 4 chronic kidney disease, or unspecified chronic kidney disease: Secondary | ICD-10-CM | POA: Diagnosis not present

## 2017-08-28 DIAGNOSIS — I4891 Unspecified atrial fibrillation: Secondary | ICD-10-CM

## 2017-08-28 DIAGNOSIS — I10 Essential (primary) hypertension: Secondary | ICD-10-CM | POA: Diagnosis not present

## 2017-08-28 DIAGNOSIS — Z66 Do not resuscitate: Secondary | ICD-10-CM | POA: Diagnosis present

## 2017-08-28 DIAGNOSIS — J9601 Acute respiratory failure with hypoxia: Secondary | ICD-10-CM | POA: Diagnosis not present

## 2017-08-28 DIAGNOSIS — R0682 Tachypnea, not elsewhere classified: Secondary | ICD-10-CM | POA: Diagnosis not present

## 2017-08-28 DIAGNOSIS — Z7951 Long term (current) use of inhaled steroids: Secondary | ICD-10-CM | POA: Diagnosis not present

## 2017-08-28 DIAGNOSIS — G4733 Obstructive sleep apnea (adult) (pediatric): Secondary | ICD-10-CM | POA: Diagnosis present

## 2017-08-28 DIAGNOSIS — E039 Hypothyroidism, unspecified: Secondary | ICD-10-CM | POA: Diagnosis present

## 2017-08-28 DIAGNOSIS — J181 Lobar pneumonia, unspecified organism: Secondary | ICD-10-CM | POA: Diagnosis not present

## 2017-08-28 DIAGNOSIS — E785 Hyperlipidemia, unspecified: Secondary | ICD-10-CM | POA: Diagnosis present

## 2017-08-28 DIAGNOSIS — F329 Major depressive disorder, single episode, unspecified: Secondary | ICD-10-CM | POA: Diagnosis present

## 2017-08-28 DIAGNOSIS — F419 Anxiety disorder, unspecified: Secondary | ICD-10-CM | POA: Diagnosis present

## 2017-08-28 DIAGNOSIS — Z7982 Long term (current) use of aspirin: Secondary | ICD-10-CM

## 2017-08-28 DIAGNOSIS — Z9181 History of falling: Secondary | ICD-10-CM | POA: Diagnosis not present

## 2017-08-28 DIAGNOSIS — I5031 Acute diastolic (congestive) heart failure: Secondary | ICD-10-CM

## 2017-08-28 DIAGNOSIS — N183 Chronic kidney disease, stage 3 unspecified: Secondary | ICD-10-CM | POA: Diagnosis present

## 2017-08-28 DIAGNOSIS — Z79890 Hormone replacement therapy (postmenopausal): Secondary | ICD-10-CM

## 2017-08-28 DIAGNOSIS — Z8673 Personal history of transient ischemic attack (TIA), and cerebral infarction without residual deficits: Secondary | ICD-10-CM

## 2017-08-28 DIAGNOSIS — Z7189 Other specified counseling: Secondary | ICD-10-CM | POA: Diagnosis not present

## 2017-08-28 DIAGNOSIS — Z950 Presence of cardiac pacemaker: Secondary | ICD-10-CM

## 2017-08-28 DIAGNOSIS — I442 Atrioventricular block, complete: Secondary | ICD-10-CM | POA: Diagnosis present

## 2017-08-28 DIAGNOSIS — R296 Repeated falls: Secondary | ICD-10-CM | POA: Diagnosis present

## 2017-08-28 DIAGNOSIS — Z515 Encounter for palliative care: Secondary | ICD-10-CM

## 2017-08-28 DIAGNOSIS — I480 Paroxysmal atrial fibrillation: Secondary | ICD-10-CM | POA: Diagnosis not present

## 2017-08-28 DIAGNOSIS — R279 Unspecified lack of coordination: Secondary | ICD-10-CM | POA: Diagnosis not present

## 2017-08-28 DIAGNOSIS — I5043 Acute on chronic combined systolic (congestive) and diastolic (congestive) heart failure: Secondary | ICD-10-CM | POA: Diagnosis not present

## 2017-08-28 DIAGNOSIS — Z823 Family history of stroke: Secondary | ICD-10-CM | POA: Diagnosis not present

## 2017-08-28 DIAGNOSIS — Z9981 Dependence on supplemental oxygen: Secondary | ICD-10-CM

## 2017-08-28 DIAGNOSIS — E1122 Type 2 diabetes mellitus with diabetic chronic kidney disease: Secondary | ICD-10-CM | POA: Diagnosis present

## 2017-08-28 DIAGNOSIS — R0602 Shortness of breath: Secondary | ICD-10-CM | POA: Diagnosis not present

## 2017-08-28 DIAGNOSIS — Z79899 Other long term (current) drug therapy: Secondary | ICD-10-CM

## 2017-08-28 DIAGNOSIS — E0781 Sick-euthyroid syndrome: Secondary | ICD-10-CM | POA: Diagnosis present

## 2017-08-28 DIAGNOSIS — K5520 Angiodysplasia of colon without hemorrhage: Secondary | ICD-10-CM | POA: Diagnosis present

## 2017-08-28 DIAGNOSIS — I110 Hypertensive heart disease with heart failure: Secondary | ICD-10-CM | POA: Diagnosis not present

## 2017-08-28 DIAGNOSIS — I5030 Unspecified diastolic (congestive) heart failure: Secondary | ICD-10-CM | POA: Diagnosis not present

## 2017-08-28 DIAGNOSIS — J811 Chronic pulmonary edema: Secondary | ICD-10-CM | POA: Diagnosis present

## 2017-08-28 LAB — CBC WITH DIFFERENTIAL/PLATELET
BASOS ABS: 0 10*3/uL (ref 0.0–0.1)
Basophils Relative: 0 %
EOS ABS: 0.1 10*3/uL (ref 0.0–0.7)
Eosinophils Relative: 1 %
HCT: 32.9 % — ABNORMAL LOW (ref 39.0–52.0)
HEMOGLOBIN: 9.8 g/dL — AB (ref 13.0–17.0)
LYMPHS ABS: 1.5 10*3/uL (ref 0.7–4.0)
LYMPHS PCT: 23 %
MCH: 28.9 pg (ref 26.0–34.0)
MCHC: 29.8 g/dL — ABNORMAL LOW (ref 30.0–36.0)
MCV: 97.1 fL (ref 78.0–100.0)
Monocytes Absolute: 0.8 10*3/uL (ref 0.1–1.0)
Monocytes Relative: 13 %
NEUTROS PCT: 63 %
Neutro Abs: 4 10*3/uL (ref 1.7–7.7)
Platelets: 159 10*3/uL (ref 150–400)
RBC: 3.39 MIL/uL — AB (ref 4.22–5.81)
RDW: 23.7 % — ABNORMAL HIGH (ref 11.5–15.5)
WBC: 6.5 10*3/uL (ref 4.0–10.5)

## 2017-08-28 LAB — I-STAT CHEM 8, ED
BUN: 17 mg/dL (ref 6–20)
CALCIUM ION: 1.1 mmol/L — AB (ref 1.15–1.40)
Chloride: 103 mmol/L (ref 101–111)
Creatinine, Ser: 1 mg/dL (ref 0.61–1.24)
Glucose, Bld: 171 mg/dL — ABNORMAL HIGH (ref 65–99)
HEMATOCRIT: 26 % — AB (ref 39.0–52.0)
HEMOGLOBIN: 8.8 g/dL — AB (ref 13.0–17.0)
Potassium: 4.1 mmol/L (ref 3.5–5.1)
SODIUM: 139 mmol/L (ref 135–145)
TCO2: 24 mmol/L (ref 22–32)

## 2017-08-28 LAB — BASIC METABOLIC PANEL
Anion gap: 10 (ref 5–15)
BUN: 19 mg/dL (ref 6–20)
CHLORIDE: 103 mmol/L (ref 101–111)
CO2: 24 mmol/L (ref 22–32)
CREATININE: 1.18 mg/dL (ref 0.61–1.24)
Calcium: 8.1 mg/dL — ABNORMAL LOW (ref 8.9–10.3)
GFR, EST NON AFRICAN AMERICAN: 55 mL/min — AB (ref >60)
Glucose, Bld: 178 mg/dL — ABNORMAL HIGH (ref 65–99)
Potassium: 4.7 mmol/L (ref 3.5–5.1)
SODIUM: 137 mmol/L (ref 135–145)

## 2017-08-28 LAB — GLUCOSE, CAPILLARY: GLUCOSE-CAPILLARY: 192 mg/dL — AB (ref 65–99)

## 2017-08-28 LAB — BRAIN NATRIURETIC PEPTIDE: B NATRIURETIC PEPTIDE 5: 1351 pg/mL — AB (ref 0.0–100.0)

## 2017-08-28 LAB — HEMOGLOBIN A1C
HEMOGLOBIN A1C: 5.5 % (ref 4.8–5.6)
MEAN PLASMA GLUCOSE: 111.15 mg/dL

## 2017-08-28 LAB — I-STAT TROPONIN, ED: Troponin i, poc: 0.01 ng/mL (ref 0.00–0.08)

## 2017-08-28 LAB — CBG MONITORING, ED: GLUCOSE-CAPILLARY: 115 mg/dL — AB (ref 65–99)

## 2017-08-28 MED ORDER — INSULIN ASPART 100 UNIT/ML ~~LOC~~ SOLN
0.0000 [IU] | Freq: Three times a day (TID) | SUBCUTANEOUS | Status: DC
  Administered 2017-08-29: 1 [IU] via SUBCUTANEOUS

## 2017-08-28 MED ORDER — AMIODARONE HCL 200 MG PO TABS
200.0000 mg | ORAL_TABLET | ORAL | Status: DC
  Administered 2017-08-28 – 2017-09-02 (×3): 200 mg via ORAL
  Filled 2017-08-28 (×4): qty 1

## 2017-08-28 MED ORDER — FUROSEMIDE 10 MG/ML IJ SOLN
40.0000 mg | Freq: Once | INTRAMUSCULAR | Status: AC
  Administered 2017-08-28: 40 mg via INTRAVENOUS
  Filled 2017-08-28: qty 4

## 2017-08-28 MED ORDER — ATORVASTATIN CALCIUM 40 MG PO TABS
40.0000 mg | ORAL_TABLET | Freq: Every day | ORAL | Status: DC
  Administered 2017-08-29 – 2017-09-02 (×4): 40 mg via ORAL
  Filled 2017-08-28 (×9): qty 1

## 2017-08-28 MED ORDER — SODIUM CHLORIDE 0.9% FLUSH
3.0000 mL | Freq: Two times a day (BID) | INTRAVENOUS | Status: DC
  Administered 2017-08-28 – 2017-09-02 (×11): 3 mL via INTRAVENOUS

## 2017-08-28 MED ORDER — LEVOTHYROXINE SODIUM 25 MCG PO TABS
25.0000 ug | ORAL_TABLET | Freq: Every day | ORAL | Status: DC
  Administered 2017-08-29 – 2017-09-03 (×4): 25 ug via ORAL
  Filled 2017-08-28 (×5): qty 1

## 2017-08-28 MED ORDER — METOPROLOL TARTRATE 25 MG PO TABS
25.0000 mg | ORAL_TABLET | Freq: Two times a day (BID) | ORAL | Status: AC
  Administered 2017-08-28 – 2017-09-02 (×11): 25 mg via ORAL
  Filled 2017-08-28 (×11): qty 1

## 2017-08-28 MED ORDER — ASPIRIN EC 81 MG PO TBEC
81.0000 mg | DELAYED_RELEASE_TABLET | Freq: Every day | ORAL | Status: DC
  Administered 2017-08-28 – 2017-09-03 (×7): 81 mg via ORAL
  Filled 2017-08-28 (×7): qty 1

## 2017-08-28 MED ORDER — DIAZEPAM 5 MG PO TABS
2.5000 mg | ORAL_TABLET | Freq: Every evening | ORAL | Status: DC | PRN
  Administered 2017-08-28 – 2017-09-02 (×6): 2.5 mg via ORAL
  Filled 2017-08-28 (×6): qty 1

## 2017-08-28 MED ORDER — SODIUM CHLORIDE 0.9 % IV SOLN
250.0000 mL | INTRAVENOUS | Status: DC | PRN
  Administered 2017-09-02: 250 mL via INTRAVENOUS

## 2017-08-28 MED ORDER — ACETAMINOPHEN 325 MG PO TABS
650.0000 mg | ORAL_TABLET | ORAL | Status: DC | PRN
  Administered 2017-08-28 – 2017-09-02 (×4): 650 mg via ORAL
  Filled 2017-08-28 (×4): qty 2

## 2017-08-28 MED ORDER — DULOXETINE HCL 60 MG PO CPEP
60.0000 mg | ORAL_CAPSULE | Freq: Every evening | ORAL | Status: DC
  Administered 2017-08-28 – 2017-09-02 (×6): 60 mg via ORAL
  Filled 2017-08-28 (×4): qty 1
  Filled 2017-08-28: qty 2
  Filled 2017-08-28 (×2): qty 1

## 2017-08-28 MED ORDER — ENSURE ENLIVE PO LIQD
237.0000 mL | Freq: Two times a day (BID) | ORAL | Status: DC
  Administered 2017-08-29: 237 mL via ORAL

## 2017-08-28 MED ORDER — FERROUS SULFATE 325 (65 FE) MG PO TABS
325.0000 mg | ORAL_TABLET | Freq: Two times a day (BID) | ORAL | Status: DC
  Administered 2017-08-29 – 2017-09-03 (×11): 325 mg via ORAL
  Filled 2017-08-28 (×18): qty 1

## 2017-08-28 MED ORDER — DIVALPROEX SODIUM 250 MG PO DR TAB
250.0000 mg | DELAYED_RELEASE_TABLET | Freq: Two times a day (BID) | ORAL | Status: DC
  Administered 2017-08-28 – 2017-09-03 (×12): 250 mg via ORAL
  Filled 2017-08-28 (×12): qty 1

## 2017-08-28 MED ORDER — ENOXAPARIN SODIUM 40 MG/0.4ML ~~LOC~~ SOLN
40.0000 mg | SUBCUTANEOUS | Status: DC
  Administered 2017-08-28 – 2017-09-02 (×6): 40 mg via SUBCUTANEOUS
  Filled 2017-08-28 (×6): qty 0.4

## 2017-08-28 MED ORDER — ORAL CARE MOUTH RINSE
15.0000 mL | Freq: Two times a day (BID) | OROMUCOSAL | Status: DC
Start: 2017-08-29 — End: 2017-09-03
  Administered 2017-08-30 – 2017-09-02 (×7): 15 mL via OROMUCOSAL

## 2017-08-28 MED ORDER — ONDANSETRON HCL 4 MG/2ML IJ SOLN: 4.0000 mg | Freq: Four times a day (QID) | INTRAMUSCULAR | Status: DC | PRN

## 2017-08-28 MED ORDER — SODIUM CHLORIDE 0.9% FLUSH: 3.0000 mL | INTRAVENOUS | Status: DC | PRN

## 2017-08-28 MED ORDER — FUROSEMIDE 10 MG/ML IJ SOLN
40.0000 mg | Freq: Two times a day (BID) | INTRAMUSCULAR | Status: DC
  Administered 2017-08-28 – 2017-08-29 (×2): 40 mg via INTRAVENOUS
  Filled 2017-08-28 (×2): qty 4

## 2017-08-28 MED ORDER — PANTOPRAZOLE SODIUM 40 MG PO TBEC
40.0000 mg | DELAYED_RELEASE_TABLET | Freq: Every day | ORAL | Status: DC
  Administered 2017-08-29 – 2017-09-03 (×6): 40 mg via ORAL
  Filled 2017-08-28 (×6): qty 1

## 2017-08-28 NOTE — ED Provider Notes (Signed)
[facility] EMERGENCY DEPARTMENT Provider Note   CSN: [id number] Arrival date & time: 08/28/17  0439     History   Chief Complaint Chief Complaint  Patient presents with  . Shortness of Breath  Level 5 caveat due to acuity of condition  HPI Richard [name] is a 84 y.o. male.  The history is provided by the patient and the EMS personnel. The history is limited by the condition of the patient.  Shortness of Breath  This is a new problem. The average episode lasts 2 days. The problem occurs frequently.The problem has been rapidly worsening. Associated symptoms include cough. Treatments tried: albuterol. The treatment provided mild relief.  Patient with history of atrial fibrillation, previous stroke, presents with shortness of breath over the past 2 days. EMS was called due to shortness of breath and due to severity patient was placed on CPAP as well as given nebulizer treatments He has had minimal improvement with treatment No other details noted at this time  Past Medical History:  Diagnosis Date  . Anxiety   . Arthritis   . Atrial fibrillation ([place])    a. Dx 03/2013, notes report atrial fibrillation/atrial flutter, placed on amiodarone. NSR in subsequent OV's.  . B12 deficiency anemia   . Cancer ([place])   . Chest pain    a. H/o CTA negative for PE 2012, normal cath 2005, normal nucs previously including 05/2012.  Marland Kitchen Complete heart block (HCC)    a. s/p Medtronic Adapta L model ADDRL 1 (serial number [id number]) pacemaker.  . Dementia   . GERD (gastroesophageal reflux disease)   . Hiatal hernia   . Hyperlipidemia   . Hypertension   . Iron deficiency anemia   . LBBB (left bundle branch block)   . Microcytic anemia   . Nephrolithiasis   . On home oxygen therapy    a. 2L w/CPAP at night  . Orthostatic hypotension   . OSA on CPAP    setting = 4  . Pacemaker   . [place] spotted fever ~ 1945  . Sinus drainage   . Stroke ([place]) 05/2016  . Type II diabetes mellitus  [facility])     Patient Active Problem List   Diagnosis Date Noted  . Acute on chronic diastolic CHF (congestive heart failure) ([place]) 05/22/2017  . CKD (chronic kidney disease) stage 3, GFR 30-59 ml/min (HCC) 05/22/2017  . Acute respiratory failure with hypoxia ([place]) 05/22/2017  . Intracranial hemorrhage ([place]) 03/03/2017  . Head trauma 03/02/2017  . Chronic diastolic CHF (congestive heart failure) ([place]) 11/16/2016  . AVM (arteriovenous malformation) of small bowel, acquired ([place])   . Abnormality of gait as late effect of stroke 05/27/2016  . Benign neoplasm of cecum   . Benign neoplasm of sigmoid colon   . GI bleeding 05/22/2016  . Melena   . GIB (gastrointestinal bleeding) 05/21/2016  . Diabetes mellitus with complication ([place])   . Upper GI bleed   . CKD (chronic kidney disease)   . Type 2 diabetes mellitus with circulatory disorder, without long-term current use of insulin ([place])   . Acute right PCA stroke ([place]) 05/08/2016  . Ataxia due to recent stroke   . Gait disturbance, post-stroke   . Cerebellar stroke, acute ([place]) 05/07/2016  . Hyperlipidemia   . Chronic anticoagulation   . Cardiac pacemaker in situ   . Acute blood loss anemia   . Cognitive deficits   . Cognitive deficit due to recent stroke   . Stroke-related cognitive dysfunction   .  Generalized weakness 05/05/2016  . Dizziness   . Absolute anemia 02/08/2016  . Weakness 02/01/2016  . Gastric polyp 06/21/2015  . Iron deficiency anemia due to chronic blood loss   . GERD (gastroesophageal reflux disease)   . B12 deficiency anemia   . Bradycardia with less than 30 beats per minute 05/31/2014  . Complete heart block ([place]) 05/31/2014  . Syncope 05/31/2014  . Anemia 05/31/2014  . Atrial fibrillation ([place]) 04/08/2013  . Diabetes mellitus ([place]) 04/06/2013  . Hiatal hernia 04/06/2013  . Essential hypertension 06/13/2011  . OSA (obstructive sleep apnea) 06/13/2011    Past Surgical History:  Procedure Laterality Date  .  CARDIAC CATHETERIZATION     by Dr. Acie Fredrickson, January 24, 2004, that shows minimal coronary artery irregularities and normal left ventricular function  . CATARACT EXTRACTION W/ INTRAOCULAR LENS  IMPLANT, BILATERAL Bilateral   . COLONOSCOPY WITH PROPOFOL N/A 05/23/2016   Procedure: COLONOSCOPY WITH PROPOFOL;  Surgeon: Jerene Bears, MD;  Location: [place];  Service: Gastroenterology;  Laterality: N/A;  . ENTEROSCOPY N/A 06/02/2016   Procedure: ENTEROSCOPY;  Surgeon: Gatha Mayer, MD;  Location: [facility] ENDOSCOPY;  Service: Endoscopy;  Laterality: N/A;  . ESOPHAGOGASTRODUODENOSCOPY N/A 05/22/2016   Procedure: ESOPHAGOGASTRODUODENOSCOPY (EGD);  Surgeon: Jerene Bears, MD;  Location: [facility] ENDOSCOPY;  Service: Endoscopy;  Laterality: N/A;  . ESOPHAGOGASTRODUODENOSCOPY (EGD) WITH PROPOFOL N/A 06/21/2015   Procedure: ESOPHAGOGASTRODUODENOSCOPY (EGD) WITH PROPOFOL;  Surgeon: Gatha Mayer, MD;  Location: [facility] ENDOSCOPY;  Service: Endoscopy;  Laterality: N/A;  . GIVENS CAPSULE STUDY N/A 05/23/2016   Procedure: GIVENS CAPSULE STUDY;  Surgeon: Jerene Bears, MD;  Location: [place];  Service: Gastroenterology;  Laterality: N/A;  . HOT HEMOSTASIS N/A 06/02/2016   Procedure: HOT HEMOSTASIS (ARGON PLASMA COAGULATION/BICAP);  Surgeon: Gatha Mayer, MD;  Location: [facility];  Service: Endoscopy;  Laterality: N/A;  . INGUINAL HERNIA REPAIR Right   . IR RADIOLOGIST EVAL & MGMT  11/25/2016  . IR RADIOLOGIST EVAL & MGMT  01/22/2017  . IR VERTEBROPLASTY CERV/THOR BX INC UNI/BIL INC/INJECT/IMAGING  01/30/2017  . IR VERTEBROPLASTY EA ADDL (T&LS) BX INC UNI/BIL INC INJECT/IMAGING  11/28/2016  . IR VERTEBROPLASTY EA ADDL (T&LS) BX INC UNI/BIL INC INJECT/IMAGING  01/30/2017  . IR VERTEBROPLASTY LUMBAR BX INC UNI/BIL INC/INJECT/IMAGING  11/28/2016  . LUMBAR DISC SURGERY  ~ 1993  . PERMANENT PACEMAKER INSERTION N/A 06/03/2014   MDT Adapta L implanted by Dr Rayann Heman for syncope and transient AV block  . SKIN CANCER EXCISION     "lower  lip" (04/06/2013)  . TEMPORARY PACEMAKER INSERTION N/A 05/31/2014   Procedure: TEMPORARY WIRE;  Surgeon: Sinclair Grooms, MD;  Location[facility] CATH LAB;  Service: Cardiovascular;  Laterality: N/A;  . VASECTOMY     Hx of     OB History    No data available       Home Medications    Prior to Admission medications   Medication Sig Start Date End Date Taking? Authorizing Provider  acetaminophen (TYLENOL) 500 MG tablet Take 500 mg by mouth 2 (two) times daily.     [provider]  amiodarone (PACERONE) 200 MG tablet Take 200 mg by mouth every Monday, Wednesday, and Friday.     [provider]  aspirin EC 81 MG tablet Take 1 tablet (81 mg total) by mouth daily. 05/26/17   Orson Eva, MD  atorvastatin (LIPITOR) 40 MG tablet Take 1 tablet (40 mg total) by mouth daily at 6 PM. 05/16/16  Love, Pamela S, PA-C  cyanocobalamin (,VITAMIN B-12,) 1000 MCG/ML injection Inject 1,000 mcg into the muscle every 30 (thirty) days.    [provider]  diazepam (VALIUM) 5 MG tablet Take 0.5 tablets (2.5 mg total) by mouth at bedtime. 05/25/17   Orson Eva, MD  divalproex (DEPAKOTE) 250 MG DR tablet Take 1 tablet by mouth 2 (two) times daily. 05/13/17   [provider]  DULoxetine (CYMBALTA) 60 MG capsule Take 60 mg by mouth every evening.    [provider]  ferrous sulfate 325 (65 FE) MG tablet Take 325 mg by mouth 2 (two) times daily with a meal.     [provider]  fluticasone (FLONASE) 50 MCG/ACT nasal spray Place 1 spray into the nose daily as needed for allergies or rhinitis.     [provider]  furosemide (LASIX) 40 MG tablet Take 1 tablet (40 mg total) by mouth daily. 08/15/17 11/13/17  Doreatha Lew, MD  levothyroxine (SYNTHROID, LEVOTHROID) 25 MCG tablet Take 25 mcg by mouth daily before breakfast.    [provider]  metoprolol tartrate (LOPRESSOR) 25 MG tablet Take 1 tablet (25 mg total) by mouth 2 (two) times daily. 08/15/17    Patrecia Pour, Christean Grief, MD  pantoprazole (PROTONIX) 40 MG tablet Take 1 tablet (40 mg total) by mouth daily at 6 (six) AM. 05/25/16   Kelvin Cellar, MD  potassium chloride (K-DUR) 10 MEQ tablet Take 2 tablets (20 mEq total) by mouth daily. 07/30/17 10/28/17  Nahser, Wonda Cheng, MD    Family History Family History  Problem Relation Age of Onset  . Thyroid disease Mother        goiter  . Pneumonia Father   . Other Unknown        Parents both died of old age; other conditions not known  . Stroke Brother   . Breast cancer Sister     Social History Social History   Tobacco Use  . Smoking status: Former Smoker    Packs/day: 0.00    Years: 20.00    Pack years: 0.00    Types: Cigarettes  . Smokeless tobacco: Never Used  . Tobacco comment: 04/06/2013 "quit smoking years ago; didn't smoke that much; probably 20 years; 1ppd"  Substance Use Topics  . Alcohol use: No  . Drug use: No     Allergies   Codeine and Citalopram   Review of Systems Review of Systems  Unable to perform ROS: Acuity of condition  Respiratory: Positive for cough and shortness of breath.      Physical Exam Updated Vital Signs BP 133/76   Pulse 88   Resp (!) 28   Ht 1.905 m (6\' 3" )   Wt 88.5 kg (195 lb)   SpO2 98%   BMI 24.37 kg/m   Physical Exam  CONSTITUTIONAL: Elderly, respiratory distress noted HEAD: Normocephalic/atraumatic EYES: EOMI ENMT: Mucous membranes moist, cpap mask in place NECK: supple no meningeal signs SPINE/BACK:entire spine nontender CV: Tachycardic LUNGS: Distress noted, wheezing bilaterally ABDOMEN: soft, nontender GU:no cva tenderness NEURO: Pt is awake/alert/appropriate, moves all extremitiesx4.  No facial droop.   EXTREMITIES: pulses normal/equal, full ROM, minimal edema noted to bilateral lower extremities SKIN: warm, color normal PSYCH: Unable to assess  ED Treatments / Results  Labs (all labs ordered are listed, but only abnormal results are displayed) Labs  Reviewed  CBC WITH DIFFERENTIAL/PLATELET - Abnormal; Notable for the following components:      Result Value   RBC 3.39 (*)  Hemoglobin 9.8 (*)    HCT 32.9 (*)    MCHC 29.8 (*)    RDW 23.7 (*)    All other components within normal limits  BRAIN NATRIURETIC PEPTIDE - Abnormal; Notable for the following components:   B Natriuretic Peptide 1,351.0 (*)    All other components within normal limits  BASIC METABOLIC PANEL - Abnormal; Notable for the following components:   Glucose, Bld 178 (*)    Calcium 8.1 (*)    GFR calc non Af Amer 55 (*)    All other components within normal limits  I-STAT CHEM 8, ED - Abnormal; Notable for the following components:   Glucose, Bld 171 (*)    Calcium, Ion 1.10 (*)    Hemoglobin 8.8 (*)    HCT 26.0 (*)    All other components within normal limits  I-STAT TROPONIN, ED    EKG ED ECG REPORT   Date: 08/28/2017 0445  Rate: 115  Rhythm: paced  QRS Axis: left  Intervals: normal  ST/T Wave abnormalities: ST elevations anteriorly  Conduction Disutrbances:paced  Narrative Interpretation:   Old EKG Reviewed: unchanged  I have personally reviewed the EKG tracing and agree with the computerized printout as noted.  Radiology Dg Chest Port 1 View  Result Date: 08/28/2017 CLINICAL DATA:  Shortness of breath today. EXAM: PORTABLE CHEST 1 VIEW COMPARISON:  08/12/2017 FINDINGS: Left-sided pacemaker in place. Cardiomegaly with tortuous atherosclerotic aorta. Development of diffuse bilateral airspace opacities, right greater than left. Probable small pleural effusions. No pneumothorax. Stable osseous structures. IMPRESSION: 1. Development of diffuse bilateral airspace opacities, more prominent on the right. Pulmonary edema and CHF is favored over multifocal pneumonia. 2. Again seen cardiomegaly and small pleural effusions. Electronically Signed   By: Jeb Levering M.D.   On: 08/28/2017 05:12    Procedures Procedures  CRITICAL CARE Performed by: Sharyon Cable Total critical care time: 35 minutes Critical care time was exclusive of separately billable procedures and treating other patients. Critical care was necessary to treat or prevent imminent or life-threatening deterioration. Critical care was time spent personally by me on the following activities: development of treatment plan with patient and/or surrogate as well as nursing, discussions with consultants, evaluation of patient's response to treatment, examination of patient, obtaining history from patient or surrogate, ordering and performing treatments and interventions, ordering and review of laboratory studies, ordering and review of radiographic studies, pulse oximetry and re-evaluation of patient's condition. Patient requiring BiPAP, and acute CHF, required admission to stepdown  Medications Ordered in ED Medications  furosemide (LASIX) injection 40 mg (40 mg Intravenous Given 08/28/17 0618)     Initial Impression / Assessment and Plan / ED Course  I have reviewed the triage vital signs and the nursing notes.  Pertinent labs & imaging results that were available during my care of the patient were reviewed by me and considered in my medical decision making (see chart for details).     7:21 AM Patient seen on arrival for acute shortness of breath. He appear to be improving on BiPAP X-ray is most likely consistent with pulmonary edema, and his BNP is over thousand No fevers reported, except wife said he felt chilly recently I suspect this is more likely CHF than pneumonia His vitals stabilized Discussed with Dr. Brigitte Pulse for admission to stepdown He is already received nitroglycerin per EMS Final Clinical Impressions(s) / ED Diagnoses   Final diagnoses:  Acute systolic congestive heart failure [facility])    ED Discharge Orders  None       Ripley Fraise, MD 08/28/17 [phone]

## 2017-08-28 NOTE — ED Notes (Signed)
EKG done and seen by Dr Wickline 

## 2017-08-28 NOTE — H&P (Signed)
History and Physical  Richard [name] UXN:[id number] DOB: 05/07/1931 DOA: 08/28/2017   PCP: Richard Solian, MD   Patient coming from: Home  Chief Complaint: dyspnea  HPI:  Richard [name] is a 84 y.o. male with medical history of diabetes mellitus, stroke, complete heart block status post permanent pacemaker, hypertension, hyperlipidemia, defibrillation, obstructive sleep apnea presented with a one-day history of forcing shortness of breath. The patient is a poor historian secondary to his dementia. All of this history is obtained from the medical record and speaking with the patient's spouse at the bedside. Apparently, the patient has had increasing generalized weakness and shortness of breath with exertion for the better part of the last 1 week.  The patient has had 3 hospitalizations in the past 3 months.  He was most recently discharged from the hospital after a stay from 08/12/17 through 08/15/17 when the patient had symptomatic anemia resulting and near syncope.  The patient also had a hospitalization from 05/22/17 through 05/25/17 for acute on chronic systolic and diastolic CHF.  Since his last hospitalization, the patient continues to have mechanical falls at home the most recent approximately 1 week prior to this admission.  Because of his progressive shortness of breath, EMS was activated.  The patient was noted to be in distress, he was placed on BiPAP.  Notably, the patient was admitted to the hospital from 01/31/2017 through 02/01/2017 for a fall resulting in a head trauma. The patient had a left temporal and right occipital hematoma. The patient received KCentra at that time to reverse his NOAC.Since that time, the patient has continued to have falling episodes with his most recent fall 3 weeks prior to this admission. The patient had a follow-up CT of the head 2 weeks after his discharged in July. On 03/17/2017, repeat CT of the head revealed decreasing size of his right occipital  and left temporal hemorrhage with no new focal hemorrhage. The patient has followed up with his primary care provider and notified his cardiologist of stopping his NOAC. Decision was made to keep the patient off of his apixaban.   In the emergency department, the patient was afebrile hemodynamically stable saturating 100% on BiPAP.  BMP and CBC were unremarkable.  Hemoglobin was at baseline.  Chest x-ray showed pulmonary edema.  EKG showed paced rhythm.    Assessment/Plan: Acute on chronic systolic and diastolic CHF -The patient is up at least 5 pounds from his last admission -daily weights -accurate I/O's -fluid restrict -continue IV Lasix 40 mg IV bid -08/13/2017 echo--EF 45-50%, grade 3 DD, HK inferior septal, anteroseptal, and inferior myocardium  Acute respiratory failure with hypoxia -Secondary to CHF -Wean oxygen as tolerated  Paroxysmal Atrial fibrillation -Presently in sinus rhythm -The patient is not a good candidate for anticoagulation at this point secondary to his frequent falls and recent head trauma resulting in bleeding -In addition, the patient has had a history of small bowel AVMs -Continue amiodarone and metoprolol tartrate -his cardiologist was trying to arrange for Watchman procedure, but pt has been too weak to go through eval -Continue amiodarone -03/17/2017, repeat CT of the head revealed decreasing size of his right occipital and left temporal hemorrhage with no new focal hemorrhage. T  Acute respiratory failure with hypoxia -Secondary to CHF -Stable on 2 L>>>weaned to RA--95-96% -And oxygen for saturation greater than 92% -continue CPAP at night time  CKD stage 3 -baseline creatinine 0.9-1.2 -monitor with diuresis -AM BMP  Essential hypertension -Continue metoprolol  tartrate  Generalized weakness -B12--1104 -TSH--2.021 -PT eval  Hyperlipidemia -Continue statin  Diabetes mellitus type 2 -diet controlled -check A1C -novolog sliding  scale  Depression/anxiety -Continue Zoloft at home dose of Valium  OSA -he is compliant with CPAP at thome  [place] -long discussion with spouse-->DNR -consult palliative      Past Medical History:  Diagnosis Date  . Anxiety   . Arthritis   . Atrial fibrillation ([place])    a. Dx 03/2013, notes report atrial fibrillation/atrial flutter, placed on amiodarone. NSR in subsequent OV's.  . B12 deficiency anemia   . Cancer ([place])   . Chest pain    a. H/o CTA negative for PE 2012, normal cath 2005, normal nucs previously including 05/2012.  Marland Kitchen Complete heart block ([facility])    a. s/p Medtronic Adapta L model ADDRL 1 (serial number [id number]) pacemaker.  . Dementia   . GERD (gastroesophageal reflux disease)   . Hiatal hernia   . Hyperlipidemia   . Hypertension   . Iron deficiency anemia   . LBBB (left bundle branch block)   . Microcytic anemia   . Nephrolithiasis   . On home oxygen therapy    a. 2L w/CPAP at night  . Orthostatic hypotension   . OSA on CPAP    setting = 4  . Pacemaker   . [place] spotted fever ~ 1945  . Sinus drainage   . Stroke ([place]) 05/2016  . Type II diabetes mellitus ([place])    Past Surgical History:  Procedure Laterality Date  . CARDIAC CATHETERIZATION     by Dr. Acie Fredrickson, January 24, 2004, that shows minimal coronary artery irregularities and normal left ventricular function  . CATARACT EXTRACTION W/ INTRAOCULAR LENS  IMPLANT, BILATERAL Bilateral   . COLONOSCOPY WITH PROPOFOL N/A 05/23/2016   Procedure: COLONOSCOPY WITH PROPOFOL;  Surgeon: Jerene Bears, MD;  Location: [facility];  Service: Gastroenterology;  Laterality: N/A;  . ENTEROSCOPY N/A 06/02/2016   Procedure: ENTEROSCOPY;  Surgeon: Gatha Mayer, MD;  Location: [facility] ENDOSCOPY;  Service: Endoscopy;  Laterality: N/A;  . ESOPHAGOGASTRODUODENOSCOPY N/A 05/22/2016   Procedure: ESOPHAGOGASTRODUODENOSCOPY (EGD);  Surgeon: Jerene Bears, MD;  Location[facility] END[facility]COPY;  Service: Endoscopy;  Laterality: N/A;  .  ESOPHAGOGASTRODUODENOSCOPY (EGD) WITH PROPOFOL N/A 06/21/2015   Procedure: ESOPHAGOGASTRODUODENOSCOPY (EGD) WITH PROPOFOL;  Surgeon: Gatha Mayer, MD;  Location: [facility] ENDOSCOPY;  Service: Endoscopy;  Laterality: N/A;  . GIVENS CAPSULE STUDY N/A 05/23/2016   Procedure: GIVENS CAPSULE STUDY;  Surgeon: Jerene Bears, MD;  Location: [facility];  Service: Gastroenterology;  Laterality: N/A;  . HOT HEMOSTASIS N/A 06/02/2016   Procedure: HOT HEMOSTASIS (ARGON PLASMA COAGULATION/BICAP);  Surgeon: Gatha Mayer, MD;  Location: [facility];  Service: Endoscopy;  Laterality: N/A;  . INGUINAL HERNIA REPAIR Right   . IR RADIOLOGIST EVAL & MGMT  11/25/2016  . IR RADIOLOGIST EVAL & MGMT  01/22/2017  . IR VERTEBROPLASTY CERV/THOR BX INC UNI/BIL INC/INJECT/IMAGING  01/30/2017  . IR VERTEBROPLASTY EA ADDL (T&LS) BX INC UNI/BIL INC INJECT/IMAGING  11/28/2016  . IR VERTEBROPLASTY EA ADDL (T&LS) BX INC UNI/BIL INC INJECT/IMAGING  01/30/2017  . IR VERTEBROPLASTY LUMBAR BX INC UNI/BIL INC/INJECT/IMAGING  11/28/2016  . LUMBAR DISC SURGERY  ~ 1993  . PERMANENT PACEMAKER INSERTION N/A 06/03/2014   MDT Adapta L implanted by Dr Rayann Heman for syncope and transient AV block  . SKIN CANCER EXCISION     "lower lip" (04/06/2013)  . TEMPORARY PACEMAKER INSERTION N/A 05/31/2014   Procedure: TEMPORARY  WIRE;  Surgeon: Sinclair Grooms, MD;  Location: [facility] C[facility] LAB;  Service: Cardiovascular;  Laterality: N/A;  . VASECTOMY     Hx of    Social History:  reports that he has quit smoking. His smoking use included cigarettes. He smoked 0.00 packs per day for 20.00 years. he has never used smokeless tobacco. He reports that he does not drink alcohol or use drugs.   Family History  Problem Relation Age of Onset  . Thyroid disease Mother        goiter  . Pneumonia Father   . Other Unknown        Parents both died of old age; other conditions not known  . Stroke Brother   . Breast cancer Sister      Allergies  Allergen Reactions  .  Codeine Other (See Comments)    Hallucinations   . Citalopram Swelling and Rash     Prior to Admission medications   Medication Sig Start Date End Date Taking? Authorizing Provider  acetaminophen (TYLENOL) 500 MG tablet Take 500 mg by mouth 2 (two) times daily.     [provider]  amiodarone (PACERONE) 200 MG tablet Take 200 mg by mouth every Monday, Wednesday, and Friday.     [provider]  aspirin EC 81 MG tablet Take 1 tablet (81 mg total) by mouth daily. 05/26/17   Orson Eva, MD  atorvastatin (LIPITOR) 40 MG tablet Take 1 tablet (40 mg total) by mouth daily at 6 PM. 05/16/16   Love, Ivan Anchors, PA-C  cyanocobalamin (,VITAMIN B-12,) 1000 MCG/ML injection Inject 1,000 mcg into the muscle every 30 (thirty) days.    [provider]  diazepam (VALIUM) 5 MG tablet Take 0.5 tablets (2.5 mg total) by mouth at bedtime. 05/25/17   Orson Eva, MD  divalproex (DEPAKOTE) 250 MG DR tablet Take 1 tablet by mouth 2 (two) times daily. 05/13/17   [provider]  DULoxetine (CYMBALTA) 60 MG capsule Take 60 mg by mouth every evening.    [provider]  ferrous sulfate 325 (65 FE) MG tablet Take 325 mg by mouth 2 (two) times daily with a meal.     [provider]  fluticasone (FLONASE) 50 MCG/ACT nasal spray Place 1 spray into the nose daily as needed for allergies or rhinitis.     [provider]  furosemide (LASIX) 40 MG tablet Take 1 tablet (40 mg total) by mouth daily. 08/15/17 11/13/17  Doreatha Lew, MD  levothyroxine (SYNTHROID, LEVOTHROID) 25 MCG tablet Take 25 mcg by mouth daily before breakfast.    [provider]  metoprolol tartrate (LOPRESSOR) 25 MG tablet Take 1 tablet (25 mg total) by mouth 2 (two) times daily. 08/15/17   Patrecia Pour, Christean Grief, MD  pantoprazole (PROTONIX) 40 MG tablet Take 1 tablet (40 mg total) by mouth daily at 6 (six) AM. 05/25/16   Kelvin Cellar, MD  potassium chloride (K-DUR) 10 MEQ tablet Take 2  tablets (20 mEq total) by mouth daily. 07/30/17 10/28/17  Nahser, Wonda Cheng, MD    Review of Systems:  Constitutional:  No weight loss, night sweats, Fevers, chills, fatigue.  Head&Eyes: No headache.  No vision loss.  No eye pain or scotoma ENT:  No Difficulty swallowing,Tooth/dental problems,Sore throat,  No ear ache, post nasal drip,  Cardio-vascular:  No chest pain, Orthopnea, PND, swelling in lower extremities,  dizziness, palpitations  GI:  No  abdominal pain, nausea, vomiting, diarrhea, loss of appetite, hematochezia, melena,  heartburn, indigestion, Resp:  No shortness of breath with exertion or at rest. No cough. No coughing up of blood .No wheezing.No chest wall deformity  Skin:  no rash or lesions.  GU:  no dysuria, change in color of urine, no urgency or frequency. No flank pain.  Musculoskeletal:  No joint pain or swelling. No decreased range of motion. No back pain.  Psych:  No change in mood or affect. No depression or anxiety. Neurologic: No headache, no dysesthesia, no focal weakness, no vision loss. No syncope  Physical Exam: Vitals:   08/28/17 0530 08/28/17 0600 08/28/17 0630 08/28/17 0700  BP: 128/73 135/69 122/71 133/76  Pulse: (!) 101 96 93 88  Resp: (!) 29 (!) 28 (!) 26 (!) 28  SpO2: 96% 98% 99% 98%  Weight:      Height:       General:  A&O x 3, NAD, nontoxic, pleasant/cooperative Head/Eye: No conjunctival hemorrhage, no icterus, [place]/AT, No nystagmus ENT:  No icterus,  No thrush, good dentition, no pharyngeal exudate Neck:  No masses, no lymphadenpathy, no bruits CV:  RRR, no rub, no gallop, no S3 Lung:  CTAB, good air movement, no wheeze, no rhonchi Abdomen: soft/NT, +BS, nondistended, no peritoneal signs Ext: No cyanosis, No rashes, No petechiae, No lymphangitis, No edema Neuro: CNII-XII intact, strength 4/5 in bilateral upper and lower extremities, no dysmetria  Labs on Admission:  Basic Metabolic Panel: Recent Labs  Lab 08/21/17 1026  08/24/17 1545 08/28/17 0545 08/28/17 0612  NA 140 140 137 139  K 4.6 4.4 4.7 4.1  CL 103 107 103 103  CO2 23 26 24   --   GLUCOSE 116* 127* 178* 171*  BUN 23 19 19 17   CREATININE 1.15 1.24 1.18 1.00  CALCIUM 8.6 8.4* 8.1*  --    Liver Function Tests: Recent Labs  Lab 08/24/17 1545  AST 33  ALT 18  ALKPHOS 104  BILITOT 0.7  PROT 6.9  ALBUMIN 2.5*   No results for input(s): LIPASE, AMYLASE in the last 168 hours. No results for input(s): AMMONIA in the last 168 hours. CBC: Recent Labs  Lab 08/21/17 1026 08/24/17 1545 08/28/17 0446 08/28/17 0612  WBC 5.0 4.5 6.5  --   NEUTROABS  --   --  4.0  --   HGB 7.8* 8.4* 9.8* 8.8*  HCT 25.1* 28.3* 32.9* 26.0*  MCV 93 95.9 97.1  --   PLT 169 199 159  --    Coagulation Profile: No results for input(s): INR, PROTIME in the last 168 hours. Cardiac Enzymes: No results for input(s): CKTOTAL, CKMB, CKMBINDEX, TROPONINI in the last 168 hours. BNP: Invalid input(s): POCBNP CBG: Recent Labs  Lab 08/24/17 1831  GLUCAP 104*   Urine analysis:    Component Value Date/Time   COLORURINE COLORLESS (A) 05/22/2017 1130   APPEARANCEUR CLEAR 05/22/2017 1130   LABSPEC 1.005 05/22/2017 1130   PHURINE 6.0 05/22/2017 1130   GLUCOSEU NEGATIVE 05/22/2017 1130   HGBUR SMALL (A) 05/22/2017 1130   BILIRUBINUR NEGATIVE 05/22/2017 1130   KETONESUR NEGATIVE 05/22/2017 1130   PROTEINUR NEGATIVE 05/22/2017 1130   UROBILINOGEN 0.2 03/30/2014 1400   NITRITE NEGATIVE 05/22/2017 1130   LEUKOCYTESUR NEGATIVE 05/22/2017 1130   Sepsis Labs: @LABRCNTIP (HQPRFFMBWGYKZ:9,DJTTSVXBLTJ:0) )No results found for this or any previous visit (from the past 240 hour(s)).   Radiological Exams on Admission: Dg Chest Port 1 View  Result Date: 08/28/2017 CLINICAL DATA:  Shortness of breath today. EXAM: PORTABLE CHEST 1 VIEW COMPARISON:  08/12/2017 FINDINGS: Left-sided  pacemaker in place. Cardiomegaly with tortuous atherosclerotic aorta. Development of diffuse  bilateral airspace opacities, right greater than left. Probable small pleural effusions. No pneumothorax. Stable osseous structures. IMPRESSION: 1. Development of diffuse bilateral airspace opacities, more prominent on the right. Pulmonary edema and CHF is favored over multifocal pneumonia. 2. Again seen cardiomegaly and small pleural effusions. Electronically Signed   By: Jeb Levering M.D.   On: 08/28/2017 05:12    EKG: Independently reviewed. AV paced    Time spent:70 minutes Code Status:   DNR Family Communication:  Spouse updated at bedside Disposition Plan: expect 2-3 day hospitalization Consults called: none  DVT Prophylaxis: [place] Lovenox  Orson Eva, DO  [facility] Pager [phone]  If 7PM-7AM, please contact night-coverage [email or web address] Password [facility] 08/28/2017, 7:42 AM

## 2017-08-28 NOTE — ED Triage Notes (Signed)
Shortness of breath onset this am, given 5 mg albuterol, cpap, 1 sl ntg en route with minimal relief, no chest pain.  Pt is on home oxygen and uses cpap at night.

## 2017-08-28 NOTE — Progress Notes (Signed)
Trial off bipap at this time. Pt Richard [name] tolerating well at this time. Pt states his breathing feeling a lot better.

## 2017-08-28 NOTE — Progress Notes (Signed)
84 year old male with history of CKD and chronic diastolic congestive heart failure presents with shortness of breath over 2 days.  He is noted to have acute pulmonary edema on chest x-ray for which she is just now receiving IV Lasix.  BNP is noted to be over thousand.  He is currently on BiPAP for respiratory distress, but otherwise has stable vital signs.  Recommend admission to stepdown unit for close monitoring initially, but may not require this after diuresis.

## 2017-08-29 DIAGNOSIS — Z950 Presence of cardiac pacemaker: Secondary | ICD-10-CM

## 2017-08-29 LAB — BASIC METABOLIC PANEL
Anion gap: 8 (ref 5–15)
BUN: 22 mg/dL — AB (ref 6–20)
CALCIUM: 8 mg/dL — AB (ref 8.9–10.3)
CO2: 25 mmol/L (ref 22–32)
CREATININE: 1.06 mg/dL (ref 0.61–1.24)
Chloride: 106 mmol/L (ref 101–111)
GFR calc non Af Amer: >60 mL/min (ref >60)
Glucose, Bld: 120 mg/dL — ABNORMAL HIGH (ref 65–99)
Potassium: 4.2 mmol/L (ref 3.5–5.1)
SODIUM: 139 mmol/L (ref 135–145)

## 2017-08-29 LAB — GLUCOSE, CAPILLARY
GLUCOSE-CAPILLARY: 148 mg/dL — AB (ref 65–99)
Glucose-Capillary: 127 mg/dL — ABNORMAL HIGH (ref 65–99)
Glucose-Capillary: 112 mg/dL — ABNORMAL HIGH (ref 65–99)
Glucose-Capillary: 183 mg/dL — ABNORMAL HIGH (ref 65–99)

## 2017-08-29 MED ORDER — GLUCERNA SHAKE PO LIQD: 237.0000 mL | Freq: Three times a day (TID) | ORAL | Status: DC

## 2017-08-29 MED ORDER — FUROSEMIDE 10 MG/ML IJ SOLN
40.0000 mg | Freq: Three times a day (TID) | INTRAMUSCULAR | Status: AC
  Administered 2017-08-29 – 2017-09-01 (×11): 40 mg via INTRAVENOUS
  Filled 2017-08-29 (×11): qty 4

## 2017-08-29 MED ORDER — ENSURE ENLIVE PO LIQD
237.0000 mL | Freq: Two times a day (BID) | ORAL | Status: DC
  Administered 2017-08-29 – 2017-09-02 (×7): 237 mL via ORAL

## 2017-08-29 NOTE — Progress Notes (Signed)
I had another long discussion with the patient's spouse and family members including daughter, son-in-law, and brother.  They were updated regarding the patient's medical condition and his overall prognosis.  I discussed the patient's continued medical decline despite optimal medical therapy.  Given the patient's comorbidities, he is at high risk for continued readmission and prolonged hospitalizations.  After discussion, it was mutually agreed upon to discharge the patient home once hospice services can be confirmed and arranged.  At this time, the patient's wife's desire is to discharge home to focus on comfort.  Total time 35 min.  1700 to 1730

## 2017-08-29 NOTE — Progress Notes (Signed)
PROGRESS NOTE  [name] LFY:[id number] DOB: 27-Apr-1931 DOA: 08/28/2017 PCP: Richard Solian, MD  Brief History:  84 y.o. male with medical history of diabetes mellitus, stroke, complete heart block status post permanent pacemaker, hypertension, hyperlipidemia, defibrillation, obstructive sleep apnea presented with a one-day history of forcing shortness of breath. The patient is a poor historian secondary to his dementia. All of this history is obtained from the medical record and speaking with the patient's spouse at the bedside. Apparently, the patient has had increasing generalized weakness and shortness of breath with exertion for the better part of the last 1 week.  The patient has had 3 hospitalizations in the past 3 months.  He was most recently discharged from the hospital after a stay from 08/12/17 through 08/15/17 when the patient had symptomatic anemia resulting and near syncope.  The patient also had a hospitalization from 05/22/17 through 05/25/17 for acute on chronic systolic and diastolic CHF.  Since his last hospitalization, the patient continues to have mechanical falls at home the most recent approximately 1 week prior to this admission.  Because of his progressive shortness of breath, EMS was activated.  The patient was noted to be in distress, he was placed on BiPAP.  Notably, the patient was admitted to the hospital from 01/31/2017 through 02/01/2017 for a fall resulting in a head trauma. The patient had a left temporal and right occipital hematoma. The patient received KCentra at that time to reverse his NOAC.Since that time, the patient has continued to have falling episodes with his most recent fall 3 weeks prior to this admission. The patient had a follow-up CT of the head 2 weeks after his discharged in July. On 03/17/2017, repeat CT of the head revealed decreasing size of his right occipital and left temporal hemorrhage with no new focal hemorrhage. The patient has  followed up with his primary care provider and notified his cardiologist of stopping his NOAC. Decision was made to keep the patient off of his apixaban.     Assessment/Plan: Acute on chronic systolic and diastolic CHF -The patient is up at least 5 pounds from his last admission -daily weights -accurate I/O's -fluid restrict -increase IV Lasix 40 mg IV q 8 hours -08/13/2017 echo--EF 45-50%, grade 3 DD, HK inferior septal, anteroseptal, and inferior myocardium  Acute respiratory failure with hypoxia -Secondary to CHF -initially on BiPAP>>weaned to 4 L -remains dyspneic at rest -Wean oxygen as tolerated  ParoxysmalAtrial fibrillation -Presently in sinus rhythm -The patient is not a good candidate for anticoagulation at this point secondary to his frequent falls and recent head trauma resulting in bleeding -In addition, the patient has had a history of small bowel AVMs -Continue amiodarone and metoprolol tartrate -his cardiologist was trying to arrange for Watchman procedure, but pt has been too weak to go through eval -Continue amiodarone -03/17/2017, repeat CT of the head revealed decreasing size of his right occipital and left temporal hemorrhage with no new focal hemorrhage.   CKD stage 3 -baseline creatinine 0.9-1.2 -monitor with diuresis -AM BMP  Essential hypertension -Continue metoprolol tartrate  Generalized weakness -B12--1104 -TSH--2.021 -PT eval when stable  Complete Heart Block -s/p PPM  Hyperlipidemia -Continue statin  Diabetes mellitus type 2 -diet controlled -check A1C--5.7 -d/c CBGs and ISS  Depression/anxiety -Continue Cymbalta at home dose of Valium  OSA -he is compliant with CPAP at thome  [place] -long discussion with spouse-->DNR -08/29/17--another discussion with spouse--"I don't want him to  suffer, but I'm not ready to let him go" -consult palliative medicine    Disposition Plan:   Not stable for D/C  Family  Communication:   Spouse updated at bedside 1/5--Total time spent 35 minutes.  Greater than 50% spent face to face counseling and coordinating care.   Consultants:  none  Code Status:   DNR  DVT Prophylaxis:  [place] Lovenox   Procedures: As Listed in Progress Note Above  Antibiotics: None    Subjective: Patient is breathing a little bit better than yesterday.  He was weaned off of BiPAP.  He remains short of breath even at rest.  Denies any chest pain, nausea, vomiting, diarrhea, abdominal pain.  No dysuria hematuria.  Objective: Vitals:   08/28/17 1824 08/28/17 2118 08/28/17 2351 08/29/17 0454  BP: (!) 186/91 (!) 157/85  138/70  Pulse: (!) 114 (!) 120 93 91  Resp: (!) 30 20 16 15   Temp: 98.4 F (36.9 C) 98.1 F (36.7 C)  98 F (36.7 C)  TempSrc: Oral Oral  Oral  SpO2: 90% 100% 93% 95%  Weight: 91 kg (200 lb 9.6 oz)     Height: 6\' 3"  (1.905 m)       Intake/Output Summary (Last 24 hours) at 08/29/2017 1322 Last data filed at 08/29/2017 0759 Gross per 24 hour  Intake -  Output 700 ml  Net -700 ml   Weight change: 2.54 kg (5 lb 9.6 oz) Exam:   General:  Pt is alert, follows commands appropriately, not in acute distress  HEENT: No icterus, No thrush, No neck mass, [place]/AT  Cardiovascular: RRR, S1/S2, no rubs, no gallops  Respiratory: Bilateral crackles.  No wheezing.  Good air movement.  Abdomen: Soft/+BS, non tender, non distended, no guarding  Extremities: 2 +LE edema, No lymphangitis, No petechiae, No rashes, no synovitis   Data Reviewed: I have personally reviewed following labs and imaging studies Basic Metabolic Panel: Recent Labs  Lab 08/24/17 1545 08/28/17 0545 08/28/17 0612 08/29/17 0553  NA 140 137 139 139  K 4.4 4.7 4.1 4.2  CL 107 103 103 106  CO2 26 24  --  25  GLUCOSE 127* 178* 171* 120*  BUN 19 19 17  22*  CREATININE 1.24 1.18 1.00 1.06  CALCIUM 8.4* 8.1*  --  8.0*   Liver Function Tests: Recent Labs  Lab 08/24/17 1545  AST 33  ALT 18   ALKPHOS 104  BILITOT 0.7  PROT 6.9  ALBUMIN 2.5*   No results for input(s): LIPASE, AMYLASE in the last 168 hours. No results for input(s): AMMONIA in the last 168 hours. Coagulation Profile: No results for input(s): INR, PROTIME in the last 168 hours. CBC: Recent Labs  Lab 08/24/17 1545 08/28/17 0446 08/28/17 0612  WBC 4.5 6.5  --   NEUTROABS  --  4.0  --   HGB 8.4* 9.8* 8.8*  HCT 28.3* 32.9* 26.0*  MCV 95.9 97.1  --   PLT 199 159  --    Cardiac Enzymes: No results for input(s): CKTOTAL, CKMB, CKMBINDEX, TROPONINI in the last 168 hours. BNP: Invalid input(s): POCBNP CBG: Recent Labs  Lab 08/24/17 1831 08/28/17 1639 08/28/17 2116 08/29/17 0807 08/29/17 1157  GLUCAP 104* 115* 192* 127* 112*   HbA1C: Recent Labs    08/28/17 0446  HGBA1C 5.5   Urine analysis:    Component Value Date/Time   COLORURINE COLORLESS (A) 05/22/2017 1130   APPEARANCEUR CLEAR 05/22/2017 1130   LABSPEC 1.005 05/22/2017 1130   PHURINE 6.0 05/22/2017  [place] 05/22/2017 1130   HGBUR SMALL (A) 05/22/2017 1130   BILIRUBINUR NEGATIVE 05/22/2017 1130   KETONESUR NEGATIVE 05/22/2017 1130   PROTEINUR NEGATIVE 05/22/2017 1130   UROBILINOGEN 0.2 03/30/2014 1400   NITRITE NEGATIVE 05/22/2017 1130   LEUKOCYTESUR NEGATIVE 05/22/2017 1130   Sepsis Labs: @LABRCNTIP (procalcitonin:4,lacticidven:4) )No results found for this or any previous visit (from the past 240 hour(s)).   Scheduled Meds: . amiodarone  200 mg Oral Q M,W,F  . aspirin EC  81 mg Oral Daily  . atorvastatin  40 mg Oral q1800  . divalproex  250 mg Oral BID  . DULoxetine  60 mg Oral QPM  . enoxaparin (LOVENOX) injection  40 mg Subcutaneous Q24H  . feeding supplement (ENSURE ENLIVE)  237 mL Oral BID BM  . ferrous sulfate  325 mg Oral BID WC  . furosemide  40 mg Intravenous Q8H  . insulin aspart  0-9 Units Subcutaneous TID WC  . levothyroxine  25 mcg Oral QAC breakfast  . mouth rinse  15 mL Mouth Rinse BID  .  metoprolol tartrate  25 mg Oral BID  . pantoprazole  40 mg Oral Q0600  . sodium chloride flush  3 mL Intravenous Q12H   Continuous Infusions: . sodium chloride      Procedures/Studies: Dg Chest Port 1 View  Result Date: 08/28/2017 CLINICAL DATA:  Shortness of breath today. EXAM: PORTABLE CHEST 1 VIEW COMPARISON:  08/12/2017 FINDINGS: Left-sided pacemaker in place. Cardiomegaly with tortuous atherosclerotic aorta. Development of diffuse bilateral airspace opacities, right greater than left. Probable small pleural effusions. No pneumothorax. Stable osseous structures. IMPRESSION: 1. Development of diffuse bilateral airspace opacities, more prominent on the right. Pulmonary edema and CHF is favored over multifocal pneumonia. 2. Again seen cardiomegaly and small pleural effusions. Electronically Signed   By: Jeb Levering M.D.   On: 08/28/2017 05:12   Dg Chest Port 1 View  Result Date: 08/12/2017 CLINICAL DATA:  Shortness of breath with exertion. Ongoing for 2-3 weeks. No chest pain. EXAM: PORTABLE CHEST 1 VIEW COMPARISON:  07/21/2017 FINDINGS: There is mild bilateral chronic interstitial thickening. There is no focal parenchymal opacity. There is no pleural effusion or pneumothorax. There is stable cardiomegaly. There is a dual lead cardiac pacemaker. The osseous structures are unremarkable. IMPRESSION: No active disease. Electronically Signed   By: Kathreen Devoid   On: 08/12/2017 17:48    Orson Eva, DO  [facility] Pager [phone]  If 7PM-7AM, please contact night-coverage [email or web address] Password [email or web address] 08/29/2017, 1:22 PM   LOS: 1 day

## 2017-08-29 NOTE — Progress Notes (Addendum)
Nutrition Brief Note  Patient identified on the Malnutrition Screening Tool (MST) Report due to unintentional weight loss and poor intake due to loss of appetite.   Wt Readings from Last 15 Encounters:  08/28/17 200 lb 9.6 oz (91 kg)  08/21/17 208 lb 6.4 oz (94.5 kg)  08/15/17 203 lb 14.8 oz (92.5 kg)  07/30/17 192 lb 6.4 oz (87.3 kg)  07/21/17 199 lb 8 oz (90.5 kg)  05/25/17 212 lb 15.4 oz (96.6 kg)  04/12/17 217 lb (98.4 kg)  03/02/17 230 lb (104.3 kg)  01/30/17 232 lb (105.2 kg)  01/29/17 232 lb 6 oz (105.4 kg)  11/28/16 257 lb (116.6 kg)  11/27/16 257 lb (116.6 kg)  11/14/16 243 lb 12.8 oz (110.6 kg)  10/01/16 263 lb 6.4 oz (119.5 kg)  09/26/16 256 lb (116.1 kg)   Pt has 3x hospitalizations in last 3 months. The prior two were related to symptomatic anemia and the other for CHF. He was seen by RD ~2 weeks ago.   Pt was noted to have significant weight loss with 14.2% wt loss over 4 months. Patient was not diagnosed with malnutrition and glucerna was implemented as intervention. Will reorder this while admitted.   Per notes, pt  apparently has had progressive decline over the past year.  SNF was mentioned to family last admission, but this was declined as patient does not want to be in a nursing facility. Palliative care has been consulted to help with GOC.   Will monitor intake and order Ensure (BGs are well controlled and would benefit from extra kcal/pro)  while disposition and GOC are determined.   Current diet order is heart healthy. There is no documented intake of meals at this time.   Burtis Junes RD, LDN, CNSC Clinical Nutrition Pager: [phone] 08/29/2017 1:54 PM

## 2017-08-30 ENCOUNTER — Inpatient Hospital Stay (HOSPITAL_COMMUNITY): Payer: Medicare Other

## 2017-08-30 DIAGNOSIS — I480 Paroxysmal atrial fibrillation: Secondary | ICD-10-CM

## 2017-08-30 DIAGNOSIS — J181 Lobar pneumonia, unspecified organism: Secondary | ICD-10-CM

## 2017-08-30 LAB — BLOOD GAS, ARTERIAL
Acid-Base Excess: 4.9 mmol/L — ABNORMAL HIGH (ref 0.0–2.0)
Bicarbonate: 27.9 mmol/L (ref 20.0–28.0)
DRAWN BY: 221791
O2 CONTENT: 3 L/min
O2 Saturation: 44.7 %
pCO2 arterial: 48.4 mmHg — ABNORMAL HIGH (ref 32.0–48.0)
pH, Arterial: 7.402 (ref 7.350–7.450)

## 2017-08-30 LAB — CBC
HEMATOCRIT: 24.7 % — AB (ref 39.0–52.0)
HEMOGLOBIN: 7.5 g/dL — AB (ref 13.0–17.0)
MCH: 29.2 pg (ref 26.0–34.0)
MCHC: 30.4 g/dL (ref 30.0–36.0)
MCV: 96.1 fL (ref 78.0–100.0)
Platelets: 155 10*3/uL (ref 150–400)
RBC: 2.57 MIL/uL — AB (ref 4.22–5.81)
RDW: 22.3 % — AB (ref 11.5–15.5)
WBC: 5.6 10*3/uL (ref 4.0–10.5)

## 2017-08-30 LAB — BASIC METABOLIC PANEL
Anion gap: 10 (ref 5–15)
BUN: 28 mg/dL — ABNORMAL HIGH (ref 6–20)
CALCIUM: 8.3 mg/dL — AB (ref 8.9–10.3)
CHLORIDE: 104 mmol/L (ref 101–111)
CO2: 27 mmol/L (ref 22–32)
CREATININE: 1.19 mg/dL (ref 0.61–1.24)
GFR calc non Af Amer: 54 mL/min — ABNORMAL LOW (ref >60)
Glucose, Bld: 110 mg/dL — ABNORMAL HIGH (ref 65–99)
Potassium: 4 mmol/L (ref 3.5–5.1)
Sodium: 141 mmol/L (ref 135–145)

## 2017-08-30 LAB — URINALYSIS, COMPLETE (UACMP) WITH MICROSCOPIC
Bilirubin Urine: NEGATIVE
GLUCOSE, UA: NEGATIVE mg/dL
Ketones, ur: NEGATIVE mg/dL
LEUKOCYTES UA: NEGATIVE
NITRITE: NEGATIVE
Protein, ur: 30 mg/dL — AB
SPECIFIC GRAVITY, URINE: 1.015 (ref 1.005–1.030)
Squamous Epithelial / LPF: NONE SEEN
pH: 5 (ref 5.0–8.0)

## 2017-08-30 LAB — MAGNESIUM: MAGNESIUM: 1.6 mg/dL — AB (ref 1.7–2.4)

## 2017-08-30 LAB — GLUCOSE, CAPILLARY
GLUCOSE-CAPILLARY: 101 mg/dL — AB (ref 65–99)
Glucose-Capillary: 120 mg/dL — ABNORMAL HIGH (ref 65–99)

## 2017-08-30 MED ORDER — VANCOMYCIN HCL 10 G IV SOLR
1250.0000 mg | INTRAVENOUS | Status: DC
  Filled 2017-08-30: qty 1250

## 2017-08-30 MED ORDER — PIPERACILLIN-TAZOBACTAM 3.375 G IVPB
3.3750 g | Freq: Three times a day (TID) | INTRAVENOUS | Status: AC
  Administered 2017-08-30 – 2017-09-02 (×10): 3.375 g via INTRAVENOUS
  Filled 2017-08-30 (×11): qty 50

## 2017-08-30 MED ORDER — MAGNESIUM SULFATE 2 GM/50ML IV SOLN
2.0000 g | Freq: Once | INTRAVENOUS | Status: AC
  Administered 2017-08-30: 2 g via INTRAVENOUS
  Filled 2017-08-30: qty 50

## 2017-08-30 MED ORDER — FUROSEMIDE 10 MG/ML IJ SOLN
80.0000 mg | Freq: Once | INTRAMUSCULAR | Status: AC
  Administered 2017-08-30: 80 mg via INTRAVENOUS
  Filled 2017-08-30: qty 8

## 2017-08-30 MED ORDER — VANCOMYCIN HCL IN DEXTROSE 1-5 GM/200ML-% IV SOLN
1000.0000 mg | INTRAVENOUS | Status: AC
  Administered 2017-08-30: 1000 mg via INTRAVENOUS
  Filled 2017-08-30: qty 200

## 2017-08-30 NOTE — Progress Notes (Addendum)
I revisited patient after CXR was reviewed--increasing interstitial and airspace opacities. Pt is more SOB than this am.  Denies any chest pain Pt continue to have RR 25-30/min  Long discussion with family and spouse at bedside.  Spouse not ready to change focus of care to full comfort. Verified DNR status with spouse and she confirms  CV-RRR Lung--bilateral crackles and rhonchi Abd-soft+BS Ext--2 +LE edema  -transfer to stepdown -treat for HCAP (in addition to CHF) in setting of fever in past 24 hours -start Bipap -stat CBC, ABG, PCT -start empiric vancomycin and zosyn -MRSA screening. -Stat Lasix 80 mg x 1  DTat  The patient is critically ill with multiple organ systems failure and requires high complexity decision making for assessment and support, frequent evaluation and titration of therapies, application of advanced monitoring technologies and extensive interpretation of multiple databases.  Critical care time - 45 mins.

## 2017-08-30 NOTE — Progress Notes (Signed)
Transported patient to ICU-06. And gave report to Adonis Huguenin prior to transport.

## 2017-08-30 NOTE — Progress Notes (Signed)
Pharmacy Antibiotic Note  Richard [name] is a 84 y.o. male admitted on 08/28/2017 with pneumonia.  Pharmacy has been consulted for vancomycin dosing.  Plan: Vancomycin 2gm load then 1250 mg IV q24 hours Zosyn per MD, 3.375 gm IV q8 hours F/u renal function, cultures and clinical course  Height: 6\' 3"  (190.5 cm) Weight: 246 lb 0.5 oz (111.6 kg) IBW/kg (Calculated) : 84.5  Temp (24hrs), Avg:99.1 F (37.3 C), Min:97.6 F (36.4 C), Max:101 F (38.3 C)  Recent Labs  Lab 08/24/17 1545 08/28/17 0446 08/28/17 0545 08/28/17 0612 08/29/17 0553 08/30/17 0556  WBC 4.5 6.5  --   --   --   --   CREATININE 1.24  --  1.18 1.00 1.06 1.19    Estimated Creatinine Clearance: 62.3 mL/min (by C-G formula based on SCr of 1.19 mg/dL).    Allergies  Allergen Reactions  . Codeine Other (See Comments)    Hallucinations   . Citalopram Swelling and Rash    Antimicrobials this admission: vanc 1/6 >>  zosyn 1/6 >>   Thank you for allowing pharmacy to be a part of this patient's care.  Beverlee Nims 08/30/2017 5:57 PM

## 2017-08-30 NOTE — Progress Notes (Signed)
PROGRESS NOTE  Richard [name] UMP:[id number] DOB: 10/05/1930 DOA: 08/28/2017 PCP: Prince Solian, MD Brief History:  84 y.o.malewith medical history ofdiabetes mellitus, stroke, complete heart block status post permanent pacemaker, hypertension, hyperlipidemia, defibrillation, obstructive sleep apnea presented with a one-day history of forcing shortness of breath. The patient is a poor historian secondary to his dementia. All of this history is obtained from the medical record and speaking with the patient's spouse at the bedside. Apparently, the patient has had increasing generalized weakness and shortness of breath with exertion for the better part of the last1week. The patient has had 3 hospitalizations in the past 3 months. He was most recently discharged from the hospital after a stay from 08/12/17 through 08/15/17 when the patient had symptomatic anemia resulting in near syncope. The patient also had a hospitalization from 05/22/17 through 05/25/17 for acute on chronic systolic and diastolic CHF. Since his last hospitalization, the patient continues to have mechanical falls at home the most recent approximately 1 week prior to this admission. Because of his progressive shortness of breath, EMS was activated. The patient was noted to be in distress, he was placed on BiPAP.  Notably, the patient was admitted to the hospital from 01/31/2017 through 02/01/2017 for a fall resulting in a head trauma. The patient had a left temporal and right occipital hematoma. The patient received KCentra at that time to reverse his NOAC.Since that time, the patient has continued to have falling episodes. The patient had a follow-up CT of the head 2 weeks after his discharged in July. On 03/17/2017, repeat CT of the head revealed decreasing size of his right occipital and left temporal hemorrhage with no new focal hemorrhage. The patient has followed up with his primary care provider and notified his  cardiologist of stopping his NOAC. Decision was made to keep the patient off of his apixaban.     Assessment/Plan: Acute on chronic systolic and diastolic CHF -The patient is up at least 5 pounds from his last admission -daily weights--not accurate -I/Os no accurate due to urine incontinence -fluid restrict -continue IV Lasix40 mg IV q 8 hours -08/13/2017 echo--EF 45-50%, grade 3 DD, HK inferior septal, anteroseptal, and inferior myocardium  Acute respiratory failure with hypoxia -Secondary to CHF -initially on BiPAP>>weaned to 3 L -remains dyspneic with minimal exertion even with moving in med -Wean oxygen as tolerated -08/30/17 repeat CXR  Fever -had temp 101.0 F evening of 08/29/2017 -UA and urine culture -Repeat chest x-ray  ParoxysmalAtrial fibrillation -Presently in sinus rhythm -The patient is not a good candidate for anticoagulation at this point secondary to his frequent falls and recent head trauma resulting in bleeding -In addition, the patient has had a history of small bowel AVMs -Continue amiodarone and metoprolol tartrate -his cardiologist was trying to arrange for Watchman procedure, but pt has been too weak to go through eval -Continue amiodarone -03/17/2017, repeat CT of the head revealed decreasing size of his right occipital and left temporal hemorrhage with no new focal hemorrhage.   CKD stage 3 -baseline creatinine 0.9-1.2 -monitor with diuresis -AM BMP  Essential hypertension -Continue metoprolol tartrate  Generalized weakness -B12--1104 -TSH--2.021 -PT eval when stable  Complete Heart Block -s/p PPM  Hyperlipidemia -Continue statin  Diabetes mellitus type 2 -diet controlled -check A1C--5.7 -d/c CBGs and ISS  Depression/anxiety -Continue Cymbalta at home dose of Valium  OSA -he is compliant with CPAP at thome  [place] -long discussion with spouse-->DNR -08/29/17--another  discussion with spouse--"I don't want him to  suffer, but I'm not ready to let him go" -08/29/17--family meeting with son, grand daughter, daughter-in-law-->home with hospice 1/7 if stable -consult palliative medicine    Disposition Plan:   Home with hospice 08/31/17 if stable Family Communication:   Spouse and family updated at bedside 1/6--Total time spent 35 minutes.  Greater than 50% spent face to face counseling and coordinating care.   Consultants:  none  Code Status:   DNR  DVT Prophylaxis:  [place] Lovenox   Procedures: As Listed in Progress Note Above  Antibiotics: None        Subjective: Patient is pleasantly confused, but he is alert and able to answer questions appropriately.  He denies any fevers, chills, headache, chest pain, nausea, vomiting, diarrhea, abdominal pain.  There is no dysuria.  He denies any headache or neck pain.  Objective: Vitals:   08/29/17 2215 08/29/17 2300 08/30/17 0500 08/30/17 0504  BP:    (!) 172/89  Pulse:  88  75  Resp:  18  18  Temp: 99.2 F (37.3 C)   98.4 F (36.9 C)  TempSrc: Oral   Oral  SpO2:  93%  94%  Weight:   111.6 kg (246 lb 0.5 oz)   Height:        Intake/Output Summary (Last 24 hours) at 08/30/2017 1037 Last data filed at 08/29/2017 1431 Gross per 24 hour  Intake -  Output 325 ml  Net -325 ml   Weight change: 20.6 kg (45 lb 6.9 oz) Exam:   General:  Pt is alert, follows commands appropriately, not in acute distress  HEENT: No icterus, No thrush, No neck mass, [place]/AT  Cardiovascular: RRR, S1/S2, no rubs, no gallops  Respiratory: Bibasilar crackles.  No wheezing.  Good air movement.  Abdomen: Soft/+BS, non tender, non distended, no guarding  Extremities: 1 +LE edema, No lymphangitis, No petechiae, No rashes, no synovitis   Data Reviewed: I have personally reviewed following labs and imaging studies Basic Metabolic Panel: Recent Labs  Lab 08/24/17 1545 08/28/17 0545 08/28/17 0612 08/29/17 0553 08/30/17 0556  NA 140 137 139 139 141  K  4.4 4.7 4.1 4.2 4.0  CL 107 103 103 106 104  CO2 26 24  --  25 27  GLUCOSE 127* 178* 171* 120* 110*  BUN 19 19 17  22* 28*  CREATININE 1.24 1.18 1.00 1.06 1.19  CALCIUM 8.4* 8.1*  --  8.0* 8.3*  MG  --   --   --   --  1.6*   Liver Function Tests: Recent Labs  Lab 08/24/17 1545  AST 33  ALT 18  ALKPHOS 104  BILITOT 0.7  PROT 6.9  ALBUMIN 2.5*   No results for input(s): LIPASE, AMYLASE in the last 168 hours. No results for input(s): AMMONIA in the last 168 hours. Coagulation Profile: No results for input(s): INR, PROTIME in the last 168 hours. CBC: Recent Labs  Lab 08/24/17 1545 08/28/17 0446 08/28/17 0612  WBC 4.5 6.5  --   NEUTROABS  --  4.0  --   HGB 8.4* 9.8* 8.8*  HCT 28.3* 32.9* 26.0*  MCV 95.9 97.1  --   PLT 199 159  --    Cardiac Enzymes: No results for input(s): CKTOTAL, CKMB, CKMBINDEX, TROPONINI in the last 168 hours. BNP: Invalid input(s): POCBNP CBG: Recent Labs  Lab 08/29/17 0807 08/29/17 1157 08/29/17 1601 08/29/17 2050 08/30/17 0741  GLUCAP 127* 112* 183* 148* 101*   HbA1C: Recent  Labs    08/28/17 0446  HGBA1C 5.5   Urine analysis:    Component Value Date/Time   COLORURINE COLORLESS (A) 05/22/2017 1130   APPEARANCEUR CLEAR 05/22/2017 1130   LABSPEC 1.005 05/22/2017 1130   PHURINE 6.0 05/22/2017 1130   GLUCOSEU NEGATIVE 05/22/2017 1130   HGBUR SMALL (A) 05/22/2017 1130   BILIRUBINUR NEGATIVE 05/22/2017 1130   KETONESUR NEGATIVE 05/22/2017 1130   PROTEINUR NEGATIVE 05/22/2017 1130   UROBILINOGEN 0.2 03/30/2014 1400   NITRITE NEGATIVE 05/22/2017 1130   LEUKOCYTESUR NEGATIVE 05/22/2017 1130   Sepsis Labs: @LABRCNTIP (procalcitonin:4,lacticidven:4) )No results found for this or any previous visit (from the past 240 hour(s)).   Scheduled Meds: . amiodarone  200 mg Oral Q M,W,F  . aspirin EC  81 mg Oral Daily  . atorvastatin  40 mg Oral q1800  . divalproex  250 mg Oral BID  . DULoxetine  60 mg Oral QPM  . enoxaparin (LOVENOX)  injection  40 mg Subcutaneous Q24H  . feeding supplement (ENSURE ENLIVE)  237 mL Oral BID BM  . ferrous sulfate  325 mg Oral BID WC  . furosemide  40 mg Intravenous Q8H  . levothyroxine  25 mcg Oral QAC breakfast  . mouth rinse  15 mL Mouth Rinse BID  . metoprolol tartrate  25 mg Oral BID  . pantoprazole  40 mg Oral Q0600  . sodium chloride flush  3 mL Intravenous Q12H   Continuous Infusions: . sodium chloride      Procedures/Studies: Dg Chest Port 1 View  Result Date: 08/28/2017 CLINICAL DATA:  Shortness of breath today. EXAM: PORTABLE CHEST 1 VIEW COMPARISON:  08/12/2017 FINDINGS: Left-sided pacemaker in place. Cardiomegaly with tortuous atherosclerotic aorta. Development of diffuse bilateral airspace opacities, right greater than left. Probable small pleural effusions. No pneumothorax. Stable osseous structures. IMPRESSION: 1. Development of diffuse bilateral airspace opacities, more prominent on the right. Pulmonary edema and CHF is favored over multifocal pneumonia. 2. Again seen cardiomegaly and small pleural effusions. Electronically Signed   By: Jeb Levering M.D.   On: 08/28/2017 05:12   Dg Chest Port 1 View  Result Date: 08/12/2017 CLINICAL DATA:  Shortness of breath with exertion. Ongoing for 2-3 weeks. No chest pain. EXAM: PORTABLE CHEST 1 VIEW COMPARISON:  07/21/2017 FINDINGS: There is mild bilateral chronic interstitial thickening. There is no focal parenchymal opacity. There is no pleural effusion or pneumothorax. There is stable cardiomegaly. There is a dual lead cardiac pacemaker. The osseous structures are unremarkable. IMPRESSION: No active disease. Electronically Signed   By: Kathreen Devoid   On: 08/12/2017 17:48    Orson Eva, DO  [facility] Pager [phone]  If 7PM-7AM, please contact night-coverage [email or web address] Password [email or web address] 08/30/2017, 10:37 AM   LOS: 2 days

## 2017-08-31 ENCOUNTER — Encounter (HOSPITAL_COMMUNITY): Payer: Self-pay | Admitting: Primary Care

## 2017-08-31 ENCOUNTER — Encounter (HOSPITAL_COMMUNITY): Payer: Medicare Other

## 2017-08-31 DIAGNOSIS — Z7189 Other specified counseling: Secondary | ICD-10-CM

## 2017-08-31 DIAGNOSIS — Z515 Encounter for palliative care: Secondary | ICD-10-CM

## 2017-08-31 LAB — PROCALCITONIN
PROCALCITONIN: 0.24 ng/mL
Procalcitonin: 0.24 ng/mL

## 2017-08-31 LAB — BASIC METABOLIC PANEL
Anion gap: 12 (ref 5–15)
BUN: 29 mg/dL — AB (ref 6–20)
CALCIUM: 8 mg/dL — AB (ref 8.9–10.3)
CO2: 28 mmol/L (ref 22–32)
CREATININE: 1.28 mg/dL — AB (ref 0.61–1.24)
Chloride: 98 mmol/L — ABNORMAL LOW (ref 101–111)
GFR calc Af Amer: 58 mL/min — ABNORMAL LOW (ref >60)
GFR, EST NON AFRICAN AMERICAN: 50 mL/min — AB (ref >60)
GLUCOSE: 100 mg/dL — AB (ref 65–99)
Potassium: 3.5 mmol/L (ref 3.5–5.1)
SODIUM: 138 mmol/L (ref 135–145)

## 2017-08-31 LAB — CBC
HCT: 24.6 % — ABNORMAL LOW (ref 39.0–52.0)
Hemoglobin: 7.4 g/dL — ABNORMAL LOW (ref 13.0–17.0)
MCH: 29 pg (ref 26.0–34.0)
MCHC: 30.1 g/dL (ref 30.0–36.0)
MCV: 96.5 fL (ref 78.0–100.0)
PLATELETS: 148 10*3/uL — AB (ref 150–400)
RBC: 2.55 MIL/uL — ABNORMAL LOW (ref 4.22–5.81)
RDW: 22.3 % — AB (ref 11.5–15.5)
WBC: 4.5 10*3/uL (ref 4.0–10.5)

## 2017-08-31 LAB — MAGNESIUM: Magnesium: 1.8 mg/dL (ref 1.7–2.4)

## 2017-08-31 LAB — MRSA PCR SCREENING: MRSA BY PCR: NEGATIVE

## 2017-08-31 MED ORDER — OXYCODONE HCL 5 MG PO TABS: 5.0000 mg | ORAL_TABLET | Freq: Four times a day (QID) | ORAL | Status: DC | PRN

## 2017-08-31 MED ORDER — MAGNESIUM SULFATE 2 GM/50ML IV SOLN
2.0000 g | Freq: Once | INTRAVENOUS | Status: AC
  Administered 2017-08-31: 2 g via INTRAVENOUS
  Filled 2017-08-31: qty 50

## 2017-08-31 MED ORDER — POTASSIUM CHLORIDE 10 MEQ/100ML IV SOLN
10.0000 meq | INTRAVENOUS | Status: AC
  Administered 2017-08-31 (×2): 10 meq via INTRAVENOUS
  Filled 2017-08-31 (×2): qty 100

## 2017-08-31 NOTE — Progress Notes (Signed)
Patient off [place]. Tolerating [place] well at this time. Wife at bedside.

## 2017-08-31 NOTE — Consult Note (Signed)
Consultation Note Date: 08/31/2017   Patient Name: Richard [name]  DOB: [birth year]  MRN: [id number]  Age / Sex: 84 y.o., male  PCP: Richard Solian, MD Referring Physician: Orson Eva, MD  Reason for Consultation: Establishing goals of care and Psychosocial/spiritual support  HPI/Patient Profile: 84 y.o. male  with past medical history of atrial fibrillation, complete heart block with pacemaker, heart failure, dementia, high blood pressure and cholesterol, anxiety, anemia, history of stroke, type 2 diabetes admitted on 08/28/2017 with acute on chronic systolic and diastolic heart failure.   Clinical Assessment and Goals of Care: Richard [name] is resting quietly in bed.  He will briefly make but not keep eye contact.  He is able to tell me his name, that we are in a nursing home.  Present today at bedside his wife of 51 years, Richard [name], daughter Richard [name] and her daughter Richard [name], and son Richard [name].  We talked about Richard [name] chronic health concerns briefly, including heart failure, atrial fibrillation, and complete heart block.  We talked in detail about the 3 ways the heart works including 1) electrical, A. fib 2) vascular 3) mechanical, heart failure.  We talked about what gives Richard [name] pleasure.  Family states that he enjoys food, including popcorn.  We talked about low-salt diet, fluid restrictions, stress setting of heart failure exacerbations.  Family also states that Richard [name] enjoys visiting with family and friends.  They share that when he thought he was going to die yesterday, he told him who he wanted to see/visit with.  They share that he was able to meet with everyone.  Son Richard [name] states that they have been "talking about things".  He states that his father has shared disposition of his possessions.  I shared that this is sometimes known as "life review".  Family states that Richard [name] has  been, over the last few weeks, conducting a life review.   Family states that they feel Richard [name] is at peace, now that he has been able to visit with friends, have a life review.  I asked Richard [name] if she has found peace also.  She states that after last night, and his improvement today, she does have peace.  Healthcare power of attorney NEXT OF KIN -wife of 72 years, Richard [name].   SUMMARY OF RECOMMENDATIONS   At this point, continue to treat the treatable.  Code Status/Advance Care Planning:  DNR  Symptom Management:   Per hospitalist, no additional needs at this time.  Palliative Prophylaxis:   Frequent Pain Assessment and Turn Reposition  Additional Recommendations (Limitations, Scope, Preferences):  Continue to treat the treatable but no CPR, no intubation  Psycho-social/Spiritual:   Desire for further Chaplaincy support:no  Additional Recommendations: Caregiving  Support/Resources and Education on Hospice  Prognosis:   < 3 months, or less would not be surprising based on functional status, severity of heart problems, low albumin at 2.5  Discharge Planning: To be determined, likely home.  Home health versus hospice.      Primary  Diagnoses: Present on Admission: . Pulmonary edema . Acute respiratory failure with hypoxia ([place]) . Essential hypertension . Complete heart block ([place]) . CKD (chronic kidney disease) stage 3, GFR 30-59 ml/min (HCC) . Cardiac pacemaker in situ . AVM (arteriovenous malformation) of small bowel, acquired ([place]) . Atrial fibrillation ([place])   I have reviewed the medical record, interviewed the patient and family, and examined the patient. The following aspects are pertinent.  Past Medical History:  Diagnosis Date  . Anxiety   . Arthritis   . Atrial fibrillation ([place])    a. Dx 03/2013, notes report atrial fibrillation/atrial flutter, placed on amiodarone. NSR in subsequent OV's.  . B12 deficiency anemia   . Cancer ([place])   .  Chest pain    a. H/o CTA negative for PE 2012, normal cath 2005, normal nucs previously including 05/2012.  Marland Kitchen Complete heart block (HCC)    a. s/p Medtronic Adapta L model ADDRL 1 (serial number [id number]) pacemaker.  . Dementia   . GERD (gastroesophageal reflux disease)   . Hiatal hernia   . Hyperlipidemia   . Hypertension   . Iron deficiency anemia   . LBBB (left bundle branch block)   . Microcytic anemia   . Nephrolithiasis   . On home oxygen therapy    a. 2L w/CPAP at night  . Orthostatic hypotension   . OSA on CPAP    setting = 4  . Pacemaker   . [place] spotted fever ~ 1945  . Sinus drainage   . Stroke ([place]) 05/2016  . Type II diabetes mellitus ([place])    Social History   Socioeconomic History  . Marital status: Married    Spouse name: None  . Number of children: 2  . Years of education: None  . Highest education level: None  Social Needs  . Financial resource strain: None  . Food insecurity - worry: None  . Food insecurity - inability: None  . Transportation needs - medical: None  . Transportation needs - non-medical: None  Occupational History  . Occupation: retired  Tobacco Use  . Smoking status: Former Smoker    Packs/day: 0.00    Years: 20.00    Pack years: 0.00    Types: Cigarettes  . Smokeless tobacco: Never Used  . Tobacco comment: 04/06/2013 "quit smoking years ago; didn't smoke that much; probably 20 years; 1ppd"  Substance and Sexual Activity  . Alcohol use: No  . Drug use: No  . Sexual activity: No  Other Topics Concern  . None  Social History Narrative  . None   Family History  Problem Relation Age of Onset  . Thyroid disease Mother        goiter  . Pneumonia Father   . Other Unknown        Parents both died of old age; other conditions not known  . Stroke Brother   . Breast cancer Sister    Scheduled Meds: . amiodarone  200 mg Oral Q M,W,F  . aspirin EC  81 mg Oral Daily  . atorvastatin  40 mg Oral q1800  . divalproex   250 mg Oral BID  . DULoxetine  60 mg Oral QPM  . enoxaparin (LOVENOX) injection  40 mg Subcutaneous Q24H  . feeding supplement (ENSURE ENLIVE)  237 mL Oral BID BM  . ferrous sulfate  325 mg Oral BID WC  . furosemide  40 mg Intravenous Q8H  . levothyroxine  25 mcg Oral QAC breakfast  . mouth  rinse  15 mL Mouth Rinse BID  . metoprolol tartrate  25 mg Oral BID  . pantoprazole  40 mg Oral Q0600  . sodium chloride flush  3 mL Intravenous Q12H   Continuous Infusions: . sodium chloride    . piperacillin-tazobactam (ZOSYN)  IV Stopped (08/31/17 1237)  . vancomycin     PRN Meds:.sodium chloride, acetaminophen, diazepam, ondansetron (ZOFRAN) IV, ondansetron (ZOFRAN) IV, oxyCODONE, sodium chloride flush Medications Prior to Admission:  Prior to Admission medications   Medication Sig Start Date End Date Taking? Authorizing Provider  acetaminophen (TYLENOL) 500 MG tablet Take 500 mg by mouth 2 (two) times daily.    Yes [provider]  amiodarone (PACERONE) 200 MG tablet Take 200 mg by mouth every Monday, Wednesday, and Friday.    Yes [provider]  aspirin EC 81 MG tablet Take 1 tablet (81 mg total) by mouth daily. 05/26/17  Yes Tat, Shanon Brow, MD  atorvastatin (LIPITOR) 40 MG tablet Take 1 tablet (40 mg total) by mouth daily at 6 PM. 05/16/16  Yes Love, Ivan Anchors, PA-C  cyanocobalamin (,VITAMIN B-12,) 1000 MCG/ML injection Inject 1,000 mcg into the muscle every 30 (thirty) days.   Yes [provider]  diazepam (VALIUM) 5 MG tablet Take 0.5 tablets (2.5 mg total) by mouth at bedtime. 05/25/17  Yes Tat, Shanon Brow, MD  divalproex (DEPAKOTE) 250 MG DR tablet Take 1 tablet by mouth 2 (two) times daily. 05/13/17  Yes [provider]  DULoxetine (CYMBALTA) 60 MG capsule Take 60 mg by mouth every evening.   Yes [provider]  ferrous sulfate 325 (65 FE) MG tablet Take 325 mg by mouth 2 (two) times daily with a meal.    Yes [provider]  fluticasone (FLONASE)  50 MCG/ACT nasal spray Place 1 spray into the nose daily as needed for allergies or rhinitis.    Yes [provider]  furosemide (LASIX) 40 MG tablet Take 1 tablet (40 mg total) by mouth daily. 08/15/17 11/13/17 Yes Doreatha Lew, MD  levothyroxine (SYNTHROID, LEVOTHROID) 50 MCG tablet Take 50 mcg by mouth daily before breakfast.   Yes [provider]  metoprolol tartrate (LOPRESSOR) 25 MG tablet Take 1 tablet (25 mg total) by mouth 2 (two) times daily. 08/15/17  Yes Patrecia Pour, Christean Grief, MD  pantoprazole (PROTONIX) 40 MG tablet Take 1 tablet (40 mg total) by mouth daily at 6 (six) AM. 05/25/16  Yes Kelvin Cellar, MD  potassium chloride (K-DUR) 10 MEQ tablet Take 2 tablets (20 mEq total) by mouth daily. 07/30/17 10/28/17 Yes Nahser, Wonda Cheng, MD   Allergies  Allergen Reactions  . Codeine Other (See Comments)    Hallucinations   . Citalopram Swelling and Rash   Review of Systems  Unable to perform ROS: Dementia    Physical Exam  Constitutional: He appears ill.  Appears frail, chronically ill  HENT:  Head: Normocephalic and atraumatic.  Cardiovascular: Normal rate.  Pulmonary/Chest: Effort normal. No respiratory distress.  Abdominal: Soft. He exhibits no distension.  Neurological: He is alert.  Known dementia  Skin: Skin is warm and dry.  Psychiatric: He has a normal mood and affect. His mood appears not anxious.  Nursing note and vitals reviewed.   Vital Signs: BP (!) 154/71   Pulse 82   Temp (!) 97.5 F (36.4 C) (Oral)   Resp 16   Ht 6\' 3"  (1.905 m)   Wt 90.4 kg (199 lb 4.7 oz)   SpO2 100%   BMI 24.91  kg/m  Pain Assessment: No/denies pain   Pain Score: Asleep   SpO2: SpO2: 100 % O2 Device:SpO2: 100 % O2 Flow Rate: .O2 Flow Rate (L/min): 3 L/min  IO: Intake/output summary:   Intake/Output Summary (Last 24 hours) at 08/31/2017 1531 Last data filed at 08/31/2017 1425 Gross per 24 hour  Intake 200 ml  Output 2850 ml  Net -2650 ml    LBM:     Baseline Weight: Weight: 88.5 kg (195 lb) Most recent weight: Weight: 90.4 kg (199 lb 4.7 oz)     Palliative Assessment/Data:   Flowsheet Rows     Most Recent Value  Intake Tab  Referral Department  Hospitalist  Unit at Time of Referral  ICU  Palliative Care Primary Diagnosis  Cardiac  Date Notified  08/31/17  Palliative Care Type  New Palliative care  Reason for referral  Clarify Goals of Care  Date of Admission  08/28/17  Date first seen by Palliative Care  08/31/17  # of days Palliative referral response time  0 Day(s)  # of days IP prior to Palliative referral  3  Clinical Assessment  Palliative Performance Scale Score  20%  Pain Max last 24 hours  Not able to report  Pain Min Last 24 hours  Not able to report  Dyspnea Max Last 24 Hours  Not able to report  Dyspnea Min Last 24 hours  Not able to report  Psychosocial & Spiritual Assessment  Palliative Care Outcomes  Patient/Family meeting held?  Yes  Who was at the meeting?  Patient and wife at bedside  Patient/Family wishes: Interventions discontinued/not started   Mechanical Ventilation      Time In: 1515 Time Out: 1610 Time Total: 55 minutes Greater than 50%  of this time was spent counseling and coordinating care related to the above assessment and plan.  Signed by: Drue Novel, NP   Please contact Palliative Medicine Team phone at [phone] for questions and concerns.  For individual provider: See Shea Evans

## 2017-08-31 NOTE — Progress Notes (Signed)
Resting quietly with spouse @ bedside. bipap in place and tolerating well, respirations unlabored, O2 sat 100%. Responds to verbal stimuli and following commands.

## 2017-08-31 NOTE — Progress Notes (Addendum)
PROGRESS NOTE  SEHAJ MCENROE UXN:[id number] DOB: Aug 31, 1930 DOA: 08/28/2017 PCP: Prince Solian, MD  Brief History: 84 y.o.malewith medical history ofdiabetes mellitus, stroke, complete heart block status post permanent pacemaker, hypertension, hyperlipidemia, defibrillation, obstructive sleep apnea presented with a one-day history of forcing shortness of breath. The patient is a poor historian secondary to his dementia. All of this history is obtained from the medical record and speaking with the patient's spouse at the bedside. Apparently, the patient has had increasing generalized weakness and shortness of breath with exertion for the better part of the last1week. The patient has had 3 hospitalizations in the past 3 months. He was most recently discharged from the hospital after a stay from 08/12/17 through 08/15/17 when the patient had symptomatic anemia resulting in near syncope. The patient also had a hospitalization from 05/22/17 through 05/25/17 for acute on chronic systolic and diastolic CHF. Since his last hospitalization, the patient continues to have mechanical falls at home the most recent approximately 1 week prior to this admission. Because of his progressive shortness of breath, EMS was activated. The patient was noted to be in distress, he was placed on BiPAP in ED.  Notably, the patient was admitted to the hospital from 01/31/2017 through 02/01/2017 for a fall resulting in a head trauma. The patient had a left temporal and right occipital hematoma. The patient received KCentra at that time to reverse his NOAC.Since that time, the patient has continued to have falling episodes. The patient had a follow-up CT of the head 2 weeks after his discharged in July. On 03/17/2017, repeat CT of the head revealed decreasing size of his right occipital and left temporal hemorrhage with no new focal hemorrhage. The patient has followed up with his primary care provider and  notified his cardiologist of stopping his NOAC. Decision was made to keep the patient off of his apixaban.  Although the patient initially improved, he decompensated in the afternoon of 08/30/2017.  Repeat chest x-ray showed bilateral confluent airspace opacities as well as pulmonary edema.  The patient was started on intravenous antibiotics and moved to the stepdown unit and placed back on BiPAP.   Assessment/Plan: Acute on chronic systolic and diastolic CHF -The patient is up at least 5 pounds from his last admission -daily weights--not accurate -I/Os no accurate due to urine incontinence -fluid restrict -continueIV Lasix40 mg IVq 8 hours -08/13/2017 echo--EF 45-50%, grade 3 DD, HK inferior septal, anteroseptal, and inferior myocardium  Acute respiratory failure with hypoxia -Secondary to CHF -BiPAP>>weaned to 3 L -remains dyspneic with minimal exertion -Wean oxygen as tolerated -08/30/17 repeat CXR--personally reviewed with bilateral airspace opacities and increased interstitial markings.  Fever/HCAP/Lobar Pneumonia -had temp 101.0 F evening of 08/29/2017 -UA-neg pyuria -MRSA neg -continue zosyn  ParoxysmalAtrial fibrillation -Presently in sinus rhythm -The patient is not a good candidate for anticoagulation at this point secondary to his frequent falls and recent head trauma resulting in bleeding -In addition, the patient has had a history of small bowel AVMs -Continue amiodarone and metoprolol tartrate -his cardiologist was trying to arrange for Watchman procedure, but pt has been too weak to go through eval -Continue amiodarone -03/17/2017, repeat CT of the head revealed decreasing size of his right occipital and left temporal hemorrhage with no new focal hemorrhage.   CKD stage 3 -baseline creatinine 0.9-1.2 -monitor with diuresis -AM BMP  Essential hypertension -Continue metoprolol tartrate  Generalized weakness -B12--1104 -TSH--2.021 -PT evalwhen  stable  Complete  Heart Block -s/p PPM  Hyperlipidemia -Continue statin  Diabetes mellitus type 2 -diet controlled -check A1C--5.7 -d/c CBGs and ISS  Hypomagnesemia -replete  Hypothyroidism -08/21/17 TSH 1.880 -check free T4 -not currently on synthroid--likely need restart pending labs  Depression/anxiety -ContinueCymbaltaat home dose of Valium  OSA -he is compliant with CPAP at thome  [place] -long discussion with spouse-->DNR -08/29/17--another discussion with spouse--"I don't want him to suffer, but I'm not ready to let him go" -08/29/17--family meeting with son, grand daughter,    Disposition Plan:Home with hospice when stable Family Communication:Spouse and family updatedat bedside 1/7--Total time spent 36 minutes.  Greater than 50% spent face to face counseling and coordinating care.   Consultants:none  Code Status: DNR  DVT Prophylaxis: [place] Lovenox   Procedures: As Listed in Progress Note Above  Antibiotics: Vancomycin 08/30/17 Zosyn 08/30/17>>>      Subjective: Patient is breathing better today.  He denies any chest pain, shortness breath, nausea, vomiting, diarrhea, abdominal pain.  No dysuria hematuria.  Denies any fevers or chills.  Denies any headache or neck pain.  Objective: Vitals:   08/31/17 1500 08/31/17 1600 08/31/17 1700 08/31/17 1800  BP: (!) 130/59 136/88 128/66 (!) 149/71  Pulse: 69 74 75 (!) 48  Resp: (!) 26 (!) 27 (!) 26 (!) 29  Temp:      TempSrc:      SpO2:      Weight:      Height:        Intake/Output Summary (Last 24 hours) at 08/31/2017 1858 Last data filed at 08/31/2017 1805 Gross per 24 hour  Intake 250 ml  Output 2650 ml  Net -2400 ml   Weight change: -21.2 kg (-11.8 oz) Exam:   General:  Pt is alert, follows commands appropriately, not in acute distress  HEENT: No icterus, No thrush, No neck mass, [place]/AT  Cardiovascular: RRR, S1/S2, no rubs, no gallops  Respiratory: Bilateral crackles.   No wheezing.  Good air movement.  Abdomen: Soft/+BS, non tender, non distended, no guarding  Extremities: 1 + LE edema, No lymphangitis, No petechiae, No rashes, no synovitis   Data Reviewed: I have personally reviewed following labs and imaging studies Basic Metabolic Panel: Recent Labs  Lab 08/28/17 0545 08/28/17 0612 08/29/17 0553 08/30/17 0556 08/30/17 2242 08/31/17 0415  NA 137 139 139 141  --  138  K 4.7 4.1 4.2 4.0  --  3.5  CL 103 103 106 104  --  98*  CO2 24  --  25 27  --  28  GLUCOSE 178* 171* 120* 110*  --  100*  BUN 19 17 22* 28*  --  29*  CREATININE 1.18 1.00 1.06 1.19  --  1.28*  CALCIUM 8.1*  --  8.0* 8.3*  --  8.0*  MG  --   --   --  1.6* 1.8  --    Liver Function Tests: No results for input(s): AST, ALT, ALKPHOS, BILITOT, PROT, ALBUMIN in the last 168 hours. No results for input(s): LIPASE, AMYLASE in the last 168 hours. No results for input(s): AMMONIA in the last 168 hours. Coagulation Profile: No results for input(s): INR, PROTIME in the last 168 hours. CBC: Recent Labs  Lab 08/28/17 0446 08/28/17 0612 08/30/17 2242 08/31/17 0415  WBC 6.5  --  5.6 4.5  NEUTROABS 4.0  --   --   --   HGB 9.8* 8.8* 7.5* 7.4*  HCT 32.9* 26.0* 24.7* 24.6*  MCV 97.1  --  96.1  96.5  PLT 159  --  155 148*   Cardiac Enzymes: No results for input(s): CKTOTAL, CKMB, CKMBINDEX, TROPONINI in the last 168 hours. BNP: Invalid input(s): POCBNP CBG: Recent Labs  Lab 08/29/17 1157 08/29/17 1601 08/29/17 2050 08/30/17 0741 08/30/17 1649  GLUCAP 112* 183* 148* 101* 120*   HbA1C: No results for input(s): HGBA1C in the last 72 hours. Urine analysis:    Component Value Date/Time   COLORURINE YELLOW 08/30/2017 1102   APPEARANCEUR CLEAR 08/30/2017 1102   LABSPEC 1.015 08/30/2017 1102   PHURINE 5.0 08/30/2017 1102   GLUCOSEU NEGATIVE 08/30/2017 1102   HGBUR LARGE (A) 08/30/2017 1102   BILIRUBINUR NEGATIVE 08/30/2017 1102   KETONESUR NEGATIVE 08/30/2017 1102    PROTEINUR 30 (A) 08/30/2017 1102   UROBILINOGEN 0.2 03/30/2014 1400   NITRITE NEGATIVE 08/30/2017 1102   LEUKOCYTESUR NEGATIVE 08/30/2017 1102   Sepsis Labs: @LABRCNTIP (procalcitonin:4,lacticidven:4) ) Recent Results (from the past 240 hour(s))  MRSA PCR Screening     Status: None   Collection Time: 08/30/17  6:55 PM  Result Value Ref Range Status   MRSA by PCR NEGATIVE NEGATIVE Final    Comment:        The GeneXpert MRSA Assay (FDA approved for NASAL specimens only), is one component of a comprehensive MRSA colonization surveillance program. It is not intended to diagnose MRSA infection nor to guide or monitor treatment for MRSA infections.      Scheduled Meds: . amiodarone  200 mg Oral Q M,W,F  . aspirin EC  81 mg Oral Daily  . atorvastatin  40 mg Oral q1800  . divalproex  250 mg Oral BID  . DULoxetine  60 mg Oral QPM  . enoxaparin (LOVENOX) injection  40 mg Subcutaneous Q24H  . feeding supplement (ENSURE ENLIVE)  237 mL Oral BID BM  . ferrous sulfate  325 mg Oral BID WC  . furosemide  40 mg Intravenous Q8H  . levothyroxine  25 mcg Oral QAC breakfast  . mouth rinse  15 mL Mouth Rinse BID  . metoprolol tartrate  25 mg Oral BID  . pantoprazole  40 mg Oral Q0600  . sodium chloride flush  3 mL Intravenous Q12H   Continuous Infusions: . sodium chloride    . magnesium sulfate 1 - 4 g bolus IVPB 2 g (08/31/17 1805)  . piperacillin-tazobactam (ZOSYN)  IV 3.375 g (08/31/17 1805)    Procedures/Studies: Dg Chest Port 1 View  Result Date: 08/31/2017 CLINICAL DATA:  Acute diastolic CHF.  Atrial fibrillation. EXAM: PORTABLE CHEST 1 VIEW COMPARISON:  08/28/2017 FINDINGS: Stable cardiomegaly with aortic atherosclerosis. Confluent airspace opacities with air bronchograms in the right upper lobe compatible with developing pneumonia superimposed on chronic CHF. Small bilateral pleural effusions. No acute osseous abnormality. Left-sided pacemaker with right atrial and right  ventricular leads. IMPRESSION: 1. Stable cardiomegaly.  Stable aortic atherosclerosis. 2. Superimposed on pre-existing CHF are confluent airspace opacities in the right upper lobe with air bronchograms consistent with an evolving pneumonia. Electronically Signed   By: Ashley Royalty M.D.   On: 08/31/2017 02:12   Dg Chest Port 1 View  Result Date: 08/28/2017 CLINICAL DATA:  Shortness of breath today. EXAM: PORTABLE CHEST 1 VIEW COMPARISON:  08/12/2017 FINDINGS: Left-sided pacemaker in place. Cardiomegaly with tortuous atherosclerotic aorta. Development of diffuse bilateral airspace opacities, right greater than left. Probable small pleural effusions. No pneumothorax. Stable osseous structures. IMPRESSION: 1. Development of diffuse bilateral airspace opacities, more prominent on the right. Pulmonary edema and CHF is favored over  multifocal pneumonia. 2. Again seen cardiomegaly and small pleural effusions. Electronically Signed   By: Jeb Levering M.D.   On: 08/28/2017 05:12   Dg Chest Port 1 View  Result Date: 08/12/2017 CLINICAL DATA:  Shortness of breath with exertion. Ongoing for 2-3 weeks. No chest pain. EXAM: PORTABLE CHEST 1 VIEW COMPARISON:  07/21/2017 FINDINGS: There is mild bilateral chronic interstitial thickening. There is no focal parenchymal opacity. There is no pleural effusion or pneumothorax. There is stable cardiomegaly. There is a dual lead cardiac pacemaker. The osseous structures are unremarkable. IMPRESSION: No active disease. Electronically Signed   By: Kathreen Devoid   On: 08/12/2017 17:48    Orson Eva, DO  [facility] Pager [phone]  If 7PM-7AM, please contact night-coverage [email or web address] Password [email or web address] 08/31/2017, 6:58 PM   LOS: 3 days

## 2017-08-31 NOTE — Progress Notes (Signed)
Intermittent runs of vtach, last run 13 beats back into paced rhythm. Dr Tat paged with information.Will continue to monitor

## 2017-09-01 DIAGNOSIS — Z7189 Other specified counseling: Secondary | ICD-10-CM

## 2017-09-01 DIAGNOSIS — I5031 Acute diastolic (congestive) heart failure: Secondary | ICD-10-CM

## 2017-09-01 LAB — CBC
HCT: 25.3 % — ABNORMAL LOW (ref 39.0–52.0)
Hemoglobin: 7.7 g/dL — ABNORMAL LOW (ref 13.0–17.0)
MCH: 29.1 pg (ref 26.0–34.0)
MCHC: 30.4 g/dL (ref 30.0–36.0)
MCV: 95.5 fL (ref 78.0–100.0)
Platelets: 147 10*3/uL — ABNORMAL LOW (ref 150–400)
RBC: 2.65 MIL/uL — AB (ref 4.22–5.81)
RDW: 21.7 % — AB (ref 11.5–15.5)
WBC: 4.8 10*3/uL (ref 4.0–10.5)

## 2017-09-01 LAB — BASIC METABOLIC PANEL
ANION GAP: 9 (ref 5–15)
BUN: 27 mg/dL — AB (ref 6–20)
CO2: 31 mmol/L (ref 22–32)
Calcium: 7.7 mg/dL — ABNORMAL LOW (ref 8.9–10.3)
Chloride: 96 mmol/L — ABNORMAL LOW (ref 101–111)
Creatinine, Ser: 1.3 mg/dL — ABNORMAL HIGH (ref 0.61–1.24)
GFR calc Af Amer: 56 mL/min — ABNORMAL LOW (ref >60)
GFR, EST NON AFRICAN AMERICAN: 49 mL/min — AB (ref >60)
GLUCOSE: 120 mg/dL — AB (ref 65–99)
Potassium: 3.6 mmol/L (ref 3.5–5.1)
Sodium: 136 mmol/L (ref 135–145)

## 2017-09-01 LAB — URINE CULTURE: Culture: NO GROWTH

## 2017-09-01 LAB — MAGNESIUM: MAGNESIUM: 1.9 mg/dL (ref 1.7–2.4)

## 2017-09-01 LAB — PROCALCITONIN: Procalcitonin: 0.22 ng/mL

## 2017-09-01 LAB — T4, FREE: Free T4: 1.04 ng/dL (ref 0.61–1.12)

## 2017-09-01 LAB — BRAIN NATRIURETIC PEPTIDE: B Natriuretic Peptide: 553 pg/mL — ABNORMAL HIGH (ref 0.0–100.0)

## 2017-09-01 LAB — TSH: TSH: 5.042 u[IU]/mL — ABNORMAL HIGH (ref 0.350–4.500)

## 2017-09-01 MED ORDER — FUROSEMIDE 40 MG PO TABS
40.0000 mg | ORAL_TABLET | Freq: Two times a day (BID) | ORAL | Status: DC
  Administered 2017-09-02 – 2017-09-03 (×3): 40 mg via ORAL
  Filled 2017-09-01 (×3): qty 1

## 2017-09-01 MED ORDER — DEXTROSE 50 % IV SOLN: 1.0000 | Freq: Once | INTRAVENOUS | Status: DC

## 2017-09-01 NOTE — Progress Notes (Signed)
Daily Progress Note   Patient Name: Richard [name]       Date: 09/01/2017 DOB: 15-Jun-1931  Age: 84 y.o. MRN#: [id number] Attending Physician: Orson Eva, MD Primary Care Physician: Prince Solian, MD Admit Date: 08/28/2017  Reason for Consultation/Follow-up: Establishing goals of care, Hospice Evaluation and Psychosocial/spiritual support  Subjective: Richard [name] is lying quietly in bed.  He wakes easily, making an briefly keeping eye contact.  He tells me that he slept well last night, and is eating well.  He tells me that he feels pretty good, and his breathing is okay, too.  Present at bedside his wife, Bladimir Auman, daughter Olegario Messier and her daughter Loma Sousa, son Donnie.  We go to my office for a family meeting.  We talked in detail about Richard [name]' chronic health history including heart failure, kidney disease, and memory loss.  We reviewed labs and images in detail.  I share most current chest x-ray image, and labs.  We discussed kidney function labs in detail, we also talked about albumin in detail.  We talked about hemoglobin and transfusions at length.  We talked about treatments options, and changing what is happening.  We talked in detail about Richard [name] functional status prior to hospitalization.  He for the past few weeks, has been able to dress himself with his wife laying out his close.  She states that she must help him with bathing.  Richard [name] describes somebody with good mobility, but her daughter states that Richard [name], if he walks from the sun room to the bathroom becomes very short of breath, needing to sit down halfway.  We talked about the reality of his mobility.  Also asked about falls.  Richard [name] has been falling several times per week over the last few  months.  I share a diagram of the chronic illness pathway, and we talked about what is normal and expected.  We talked about geriatric nursing journal from summer 2016 and the changes of geriatric syndromes in older adults arrive from intensive care units including 1) functional decline, 2) urinary incontinence, 3) fecal incontinence.  [name] states that her husband has not had that problem yet, but again daughter [name] states that her father does occasionally have incontinence of bowel or bladder.  We talked about rehab at [facility].  The  Eber[place]in family shares a story of Richard [name] desire/ability to participate in physical therapy.  I share that most of the time, people with memory loss do not do well in rehab.  We talked in detail about home health and home hospice services.  We talked about each being a Medicare benefit, and only one at a time can be used.  I share a diagram outlining services but share each only, and do their task and leave, they do not stay for hours at a time.  The Manninen family currently has a caregiver who comes 2 days/week for 4 hours.  We talked about increasing this service if possible.  The Dilks family states that Richard [name] has been conducting a life review, even so far is to talk about the funeral home, a good suit for him to wear, and something about the grave site that they could not understand.  We talked about communicating with him in a way that he can understand with his memory loss.  I share the booklets "coach Broyels play book for Alzheimer's", and "gone from my sight".  We talked about the concept of treat the treatable but no extraordinary measures (DNR).  Family states they made this choice together.  We talked about the concept of do not rehospitalize.  Richard [name] states that her husband has never declined to come to the hospital.  She states that he was begging to come to the hospital this time because he could not breathe.  I share that we will do anything  when we cannot breathe.  We talked about symptom management for breathing including the use of morphine.  We talked about preferred place of death, home, and my worry that Richard [name] may be hospitalized in the future and be to ill to return home.  We also talked about the concept of let nature take its course.  I give an example of when this would be appropriate.  I share that it is 1) not illegal, 2) not unethical, 3) but we understand that some people have a moral problem with not doing everything.  Richard [name] states that if they cannot make improvements for Richard [name], she would not want him to suffer, and believes that she would possibly choose to let nature take its course rather than see him suffer.  We talked about what is next.  I share that the next 24-48 hours will show if Richard [name] is able to improve.  We again talked about looking for "meaningful improvements".  I share that his body in God's will will decide if he is able to improve.  We talked about what is normal and expected as people come near an end of life including sleeping more, eating and interacting less.  We talked about how to make choices for loved ones including 1) keep them at the center of decision making (example given), 2) are we doing something to them or for them, can we change what is happening, 3) with the person he was 10 years ago say about where he is now.  We talked about prognosis with permission.  I share that I would not be surprised if Mr.  Matthis had 3 months or less based on functional status, multiple hospitalizations in 6 months, low albumin, severity of illness.  Family, including wife, states that this would not surprise them either.  The Sheeler family states that their choice would be home with the benefits of hospice of [name] over home health services.  We talked about 24-48 hours for meaningful improvements.    Length of Stay: 4  Current Medications: Scheduled Meds:  . amiodarone   200 mg Oral Q M,W,F  . aspirin EC  81 mg Oral Daily  . atorvastatin  40 mg Oral q1800  . divalproex  250 mg Oral BID  . DULoxetine  60 mg Oral QPM  . enoxaparin (LOVENOX) injection  40 mg Subcutaneous Q24H  . feeding supplement (ENSURE ENLIVE)  237 mL Oral BID BM  . ferrous sulfate  325 mg Oral BID WC  . furosemide  40 mg Intravenous Q8H  . levothyroxine  25 mcg Oral QAC breakfast  . mouth rinse  15 mL Mouth Rinse BID  . metoprolol tartrate  25 mg Oral BID  . pantoprazole  40 mg Oral Q0600  . sodium chloride flush  3 mL Intravenous Q12H    Continuous Infusions: . sodium chloride    . piperacillin-tazobactam (ZOSYN)  IV 3.375 g (09/01/17 1112)    PRN Meds: sodium chloride, acetaminophen, diazepam, ondansetron (ZOFRAN) IV, ondansetron (ZOFRAN) IV, oxyCODONE, sodium chloride flush  Physical Exam  Constitutional: He appears ill.  Appears chronically ill, calm and cooperative, makes and briefly keeps eye contact  HENT:  Head: Normocephalic and atraumatic.  Cardiovascular: Normal rate and regular rhythm.  Pulmonary/Chest: Effort normal. No respiratory distress.  Abdominal: Soft. He exhibits no distension.  Neurological: He is alert.  Known dementia  Skin: Skin is warm.  Psychiatric: His mood appears not anxious. He is not agitated.  Nursing note and vitals reviewed.           Vital Signs: BP 110/60   Pulse 82   Temp 98.3 F (36.8 C) (Axillary)   Resp (!) 25   Ht 6\' 3"  (1.905 m)   Wt 92.2 kg (203 lb 4.2 oz)   SpO2 97%   BMI 25.41 kg/m  SpO2: SpO2: 97 % O2 Device: O2 Device: High Flow Nasal Cannula O2 Flow Rate: O2 Flow Rate (L/min): 8 L/min  Intake/output summary:   Intake/Output Summary (Last 24 hours) at 09/01/2017 1224 Last data filed at 09/01/2017 0500 Gross per 24 hour  Intake 250 ml  Output 2600 ml  Net -2350 ml   LBM:   Baseline Weight: Weight: 88.5 kg (195 lb) Most recent weight: Weight: 92.2 kg (203 lb 4.2 oz)       Palliative  Assessment/Data:    Flowsheet Rows     Most Recent Value  Intake Tab  Referral Department  Hospitalist  Unit at Time of Referral  ICU  Palliative Care Primary Diagnosis  Cardiac  Date Notified  08/31/17  Palliative Care Type  New Palliative care  Reason for referral  Clarify Goals of Care  Date of Admission  08/28/17  Date first seen by Palliative Care  08/31/17  # of days Palliative referral response time  0 Day(s)  # of days IP prior to Palliative referral  3  Clinical Assessment  Palliative Performance Scale Score  20%  Pain Max last 24 hours  Not able to report  Pain Min Last 24 hours  Not able to report  Dyspnea Max Last 24 Hours  Not able to report  Dyspnea Min Last 24 hours  Not able to report  Psychosocial & Spiritual Assessment  Palliative Care Outcomes  Patient/Family meeting held?  Yes  Who was at the meeting?  Patient and wife at bedside  Patient/Family wishes: Interventions discontinued/not started   Mechanical Ventilation  Patient Active Problem List   Diagnosis Date Noted  . Palliative care encounter   . Goals of care, counseling/discussion   . Lobar pneumonia ([place]) 08/30/2017  . Pulmonary edema 08/28/2017  . Acute on chronic combined systolic and diastolic CHF (congestive heart failure) ([place]) 08/28/2017  . Acute on chronic diastolic CHF (congestive heart failure) ([place]) 05/22/2017  . CKD (chronic kidney disease) stage 3, GFR 30-59 ml/min (HCC) 05/22/2017  . Acute respiratory failure with hypoxia ([place]) 05/22/2017  . Intracranial hemorrhage ([place]) 03/03/2017  . Head trauma 03/02/2017  . Chronic diastolic CHF (congestive heart failure) ([place]) 11/16/2016  . AVM (arteriovenous malformation) of small bowel, acquired ([place])   . Abnormality of gait as late effect of stroke 05/27/2016  . Benign neoplasm of cecum   . Benign neoplasm of sigmoid colon   . GI bleeding 05/22/2016  . Melena   . GIB (gastrointestinal bleeding) 05/21/2016  . Diabetes mellitus with  complication ([place])   . Upper GI bleed   . CKD (chronic kidney disease)   . Type 2 diabetes mellitus with circulatory disorder, without long-term current use of insulin ([place])   . Acute right PCA stroke ([place]) 05/08/2016  . Ataxia due to recent stroke   . Gait disturbance, post-stroke   . Cerebellar stroke, acute ([place]) 05/07/2016  . Hyperlipidemia   . Chronic anticoagulation   . Cardiac pacemaker in situ   . Acute blood loss anemia   . Cognitive deficits   . Cognitive deficit due to recent stroke   . Stroke-related cognitive dysfunction   . Generalized weakness 05/05/2016  . Dizziness   . Absolute anemia 02/08/2016  . Weakness 02/01/2016  . Gastric polyp 06/21/2015  . Iron deficiency anemia due to chronic blood loss   . GERD (gastroesophageal reflux disease)   . B12 deficiency anemia   . Bradycardia with less than 30 beats per minute 05/31/2014  . Complete heart block ([place]) 05/31/2014  . Syncope 05/31/2014  . Anemia 05/31/2014  . Atrial fibrillation ([place]) 04/08/2013  . Diabetes mellitus ([place]) 04/06/2013  . Hiatal hernia 04/06/2013  . Essential hypertension 06/13/2011  . OSA (obstructive sleep apnea) 06/13/2011    Palliative Care Assessment & Plan   Patient Profile: 84 y.o. male  with past medical history of atrial fibrillation, complete heart block with pacemaker, heart failure, dementia, high blood pressure and cholesterol, anxiety, anemia, history of stroke, type 2 diabetes admitted on 08/28/2017 with acute on chronic systolic and diastolic heart failure.  Assessment: acute on chronic systolic and diastolic heart failure: IV Lasix 3 times per day, diuresing, respiratory status is improving, no meaningful improvements at this point.  Family is requesting 48 hours for continued improvements.  Recommendations/Plan:  At this point, continue to treat the treatable with the goal of return of function as possible.  Home with the benefits of hospice of [facility] upon  discharge.  Goals of Care and Additional Recommendations:  Limitations on Scope of Treatment: Treat the treatable but no CPR, no intubation.  Code Status:    Code Status Orders  (From admission, onward)        Start     Ordered   08/28/17 1505  Do not attempt resuscitation (DNR)  Continuous    Question Answer Comment  In the event of cardiac or respiratory ARREST Do not call a "code blue"   In the event of cardiac or respiratory ARREST Do not perform Intubation, CPR, defibrillation or ACLS   In the event of cardiac or respiratory  ARREST Use medication by any route, position, wound care, and other measures to relive pain and suffering. May use oxygen, suction and manual treatment of airway obstruction as needed for comfort.      08/28/17 1504    Code Status History    Date Active Date Inactive Code Status Order ID Comments User Context   08/28/2017 07:32 08/28/2017 15:04 DNR [id number]  Orson Eva, MD ED   08/12/2017 22:57 08/15/2017 19:30 Full Code [id number]  Phillips Grout, MD Inpatient   05/22/2017 18:14 05/25/2017 19:01 Full Code [id number]  Orson Eva, MD Inpatient   03/03/2017 01:38 03/03/2017 21:32 Full Code [id number]  Karmen Bongo, MD Inpatient   05/21/2016 14:59 05/24/2016 16:48 Full Code [id number]  Rondel Jumbo, PA-C ED   05/08/2016 19:27 05/17/2016 13:38 Full Code [id number]  Bary Leriche, PA-C Inpatient   05/08/2016 19:27 05/08/2016 19:27 Full Code [id number]  Bary Leriche, PA-C Inpatient   05/05/2016 16:40 05/08/2016 18:24 Full Code [id number]  Rondel Jumbo, PA-C ED   05/05/2016 16:19 05/05/2016 16:40 Full Code [id number]  Rondel Jumbo, PA-C ED   06/03/2014 13:43 06/04/2014 14:04 Full Code [id number]  Thompson Grayer, MD Inpatient   05/31/2014 16:29 06/03/2014 13:43 Full Code [id number]  Charlie Pitter, PA-C Inpatient    Advance Directive Documentation     Most Recent Value  Type of Advance Directive  Living will  Pre-existing out of facility DNR order (yellow form or pink MOST  form)  No data  "MOST" Form in Place?  No data       Prognosis:   < 3 months or less would not be surprising based on functional status, severity of heart problems, low albumin at 2.5  Discharge Planning:  Family is requesting home with the benefits of hospice of [facility] upon discharge  Care plan was discussed with nursing staff, case management, social worker, and Dr. Carles Collet.   Thank you for allowing the Palliative Medicine Team to assist in the care of this patient.   Time In: 1300 Time Out: 1510 Total Time 130 minutes Prolonged Time Billed  yes       Greater than 50%  of this time was spent counseling and coordinating care related to the above assessment and plan.  Drue Novel, NP  Please contact Palliative Medicine Team phone at [phone] for questions and concerns.

## 2017-09-01 NOTE — Progress Notes (Signed)
Patient able to take medications with orange sherbet however she did have some difficulty with this and exhibited some coughing even when swallowing twice and sitting at an almost 90 degree angle. Requested a Speech Therapy eval to make sure that patient is not aspirating. Will keep NPO for now.

## 2017-09-01 NOTE — Progress Notes (Signed)
PROGRESS NOTE  Richard [name] TFT:[id number] DOB: 11-Dec-1930 DOA: 08/28/2017 PCP: Prince Solian, MD  Brief History: 84 y.o.malewith medical history ofdiabetes mellitus, stroke, complete heart block status post permanent pacemaker, hypertension, hyperlipidemia, defibrillation, obstructive sleep apnea presented with a one-day history of forcing shortness of breath. The patient is a poor historian secondary to his dementia. All of this history is obtained from the medical record and speaking with the patient's spouse at the bedside. Apparently, the patient has had increasing generalized weakness and shortness of breath with exertion for the better part of the last1week. The patient has had 3 hospitalizations in the past 3 months. He was most recently discharged from the hospital after a stay from 08/12/17 through 08/15/17 when the patient had symptomatic anemia resulting innear syncope. The patient also had a hospitalization from 05/22/17 through 05/25/17 for acute on chronic systolic and diastolic CHF. Since his last hospitalization, the patient continues to have mechanical falls at home the most recent approximately 1 week prior to this admission. Because of his progressive shortness of breath, EMS was activated. The patient was noted to be in distress, he was placed on BiPAP in ED.  Notably, the patient was admitted to the hospital from 01/31/2017 through 02/01/2017 for a fall resulting in a head trauma. The patient had a left temporal and right occipital hematoma. The patient received KCentra at that time to reverse his NOAC.Since that time, the patient has continued to have falling episodes. The patient had a follow-up CT of the head 2 weeks after his discharged in July. On 03/17/2017, repeat CT of the head revealed decreasing size of his right occipital and left temporal hemorrhage with no new focal hemorrhage. The patient has followed up with his primary care provider and  notified his cardiologist of stopping his NOAC. Decision was made to keep the patient off of his apixaban.  Although the patient initially improved, he decompensated in the afternoon of 08/30/2017.  Repeat chest x-ray showed bilateral confluent airspace opacities as well as pulmonary edema.  The patient was started on intravenous antibiotics and moved to the stepdown unit and placed back on BiPAP.   Assessment/Plan: Acute on chronic systolic and diastolic CHF -The patient was up at least 5 pounds from his last admission -daily weights--not accurate--wide variations -I/Os no accurate due to urine incontinence -fluid restrict -continueIV Lasix40 mg IVq 8 hours>>>po lasix on 09/02/17 -08/13/2017 echo--EF 45-50%, grade 3 DD, HK inferior septal, anteroseptal, and inferior myocardium -continue metoprolol  Acute respiratory failure with hypoxia -Secondary to CHF and pneumonia -BiPAP>>weaned to3L -remains dyspneicwith minimal exertion -Wean oxygen as tolerated -08/30/17 repeat CXR--personally reviewed with bilateral airspace opacities and increased interstitial markings.  Fever/HCAP/Lobar Pneumonia -had temp101.0 F evening of 08/29/2017 -UA-neg pyuria -MRSA neg -continue zosyn  ParoxysmalAtrial fibrillation -Presently in sinus/paced rhythm -The patient is not a good candidate for anticoagulation at this point secondary to his frequent falls and recent head trauma resulting in bleeding -In addition, the patient has had a history of small bowel AVMs -Continue amiodarone and metoprolol tartrate -his cardiologist was trying to arrange for Watchman procedure, but pt has been too weak to go through eval -Continue amiodarone -03/17/2017, repeat CT of the head revealed decreasing size of his right occipital and left temporal hemorrhage with no new focal hemorrhage.   CKD stage 3 -baseline creatinine 0.9-1.3 -monitor with diuresis -AM BMP  Essential hypertension -Continue  metoprolol tartrate  Generalized weakness -B12--1104 -TSH--2.021 -PT evalwhen  stable  Complete Heart Block -s/p PPM  Hyperlipidemia -Continue statin  Diabetes mellitus type 2 -diet controlled -check A1C--5.7 -d/c CBGs and ISS  Hypomagnesemia -repleted  Hypothyroidism -08/21/17 TSH 1.880 -check free T4 -not currently on synthroid--likely need restart pending labs  Depression/anxiety -ContinueCymbaltaat home dose of Valium  OSA -he is compliant with CPAP at thome  [place] -long discussion with spouse-->DNR -08/29/17--another discussion with spouse--"I don't want him to suffer, but I'm not ready to let him go" -08/29/17--family meeting with son, grand daughter,    Disposition Plan:Home with hospice likely 09/03/17 Family Communication:Spouseand familyupdatedat bedside1/8   Consultants:palliative medicine  Code Status: DNR  DVT Prophylaxis: [place] Lovenox   Procedures: As Listed in Progress Note Above  Antibiotics: Vancomycin 08/30/17 Zosyn 08/30/17>>>      Subjective: Patient is in good spirits today.  He had a good night.  He was compliant with CPAP.  He denies any fevers, chills, headache, chest pain, shortness breath, nausea, vomiting, diarrhea, abdominal pain.  No dysuria or hematuria.  Objective: Vitals:   09/01/17 0900 09/01/17 1000 09/01/17 1100 09/01/17 1200  BP: 138/82 (!) 122/59 (!) 142/68 110/60  Pulse: 78 78 83 82  Resp: 19 (!) 27 (!) 33 (!) 25  Temp:   98.3 F (36.8 C)   TempSrc:   Axillary   SpO2:   98% 97%  Weight:      Height:        Intake/Output Summary (Last 24 hours) at 09/01/2017 1653 Last data filed at 09/01/2017 1251 Gross per 24 hour  Intake 200 ml  Output 1850 ml  Net -1650 ml   Weight change: 1.8 kg (3 lb 15.5 oz) Exam:   General:  Pt is alert, follows commands appropriately, not in acute distress  HEENT: No icterus, No thrush, No neck mass, [place]/AT  Cardiovascular: RRR, S1/S2, no rubs, no  gallops  Respiratory: Bibasilar crackles but no wheezing.  Good air movement.  Abdomen: Soft/+BS, non tender, non distended, no guarding  Extremities: trace LE edema, No lymphangitis, No petechiae, No rashes, no synovitis   Data Reviewed: I have personally reviewed following labs and imaging studies Basic Metabolic Panel: Recent Labs  Lab 08/28/17 0545 08/28/17 0612 08/29/17 0553 08/30/17 0556 08/30/17 2242 08/31/17 0415 09/01/17 0431  NA 137 139 139 141  --  138 136  K 4.7 4.1 4.2 4.0  --  3.5 3.6  CL 103 103 106 104  --  98* 96*  CO2 24  --  25 27  --  28 31  GLUCOSE 178* 171* 120* 110*  --  100* 120*  BUN 19 17 22* 28*  --  29* 27*  CREATININE 1.18 1.00 1.06 1.19  --  1.28* 1.30*  CALCIUM 8.1*  --  8.0* 8.3*  --  8.0* 7.7*  MG  --   --   --  1.6* 1.8  --  1.9   Liver Function Tests: No results for input(s): AST, ALT, ALKPHOS, BILITOT, PROT, ALBUMIN in the last 168 hours. No results for input(s): LIPASE, AMYLASE in the last 168 hours. No results for input(s): AMMONIA in the last 168 hours. Coagulation Profile: No results for input(s): INR, PROTIME in the last 168 hours. CBC: Recent Labs  Lab 08/28/17 0446 08/28/17 0612 08/30/17 2242 08/31/17 0415 09/01/17 0431  WBC 6.5  --  5.6 4.5 4.8  NEUTROABS 4.0  --   --   --   --   HGB 9.8* 8.8* 7.5* 7.4* 7.7*  HCT 32.9* 26.0*  24.7* 24.6* 25.3*  MCV 97.1  --  96.1 96.5 95.5  PLT 159  --  155 148* 147*   Cardiac Enzymes: No results for input(s): CKTOTAL, CKMB, CKMBINDEX, TROPONINI in the last 168 hours. BNP: Invalid input(s): POCBNP CBG: Recent Labs  Lab 08/29/17 1157 08/29/17 1601 08/29/17 2050 08/30/17 0741 08/30/17 1649  GLUCAP 112* 183* 148* 101* 120*   HbA1C: No results for input(s): HGBA1C in the last 72 hours. Urine analysis:    Component Value Date/Time   COLORURINE YELLOW 08/30/2017 1102   APPEARANCEUR CLEAR 08/30/2017 1102   LABSPEC 1.015 08/30/2017 1102   PHURINE 5.0 08/30/2017 1102    GLUCOSEU NEGATIVE 08/30/2017 1102   HGBUR LARGE (A) 08/30/2017 1102   BILIRUBINUR NEGATIVE 08/30/2017 1102   KETONESUR NEGATIVE 08/30/2017 1102   PROTEINUR 30 (A) 08/30/2017 1102   UROBILINOGEN 0.2 03/30/2014 1400   NITRITE NEGATIVE 08/30/2017 1102   LEUKOCYTESUR NEGATIVE 08/30/2017 1102   Sepsis Labs: @LABRCNTIP (procalcitonin:4,lacticidven:4) ) Recent Results (from the past 240 hour(s))  Culture, Urine     Status: None   Collection Time: 08/30/17 11:02 AM  Result Value Ref Range Status   Specimen Description URINE, CLEAN CATCH  Final   Special Requests NONE  Final   Culture   Final    NO GROWTH Performed at [facility], [place] [address], [place], [place] [address]    Report Status 09/01/2017 FINAL  Final  MRSA PCR Screening     Status: None   Collection Time: 08/30/17  6:55 PM  Result Value Ref Range Status   MRSA by PCR NEGATIVE NEGATIVE Final    Comment:        The GeneXpert MRSA Assay (FDA approved for NASAL specimens only), is one component of a comprehensive MRSA colonization surveillance program. It is not intended to diagnose MRSA infection nor to guide or monitor treatment for MRSA infections.      Scheduled Meds: . amiodarone  200 mg Oral Q M,W,F  . aspirin EC  81 mg Oral Daily  . atorvastatin  40 mg Oral q1800  . divalproex  250 mg Oral BID  . DULoxetine  60 mg Oral QPM  . enoxaparin (LOVENOX) injection  40 mg Subcutaneous Q24H  . feeding supplement (ENSURE ENLIVE)  237 mL Oral BID BM  . ferrous sulfate  325 mg Oral BID WC  . furosemide  40 mg Intravenous Q8H  . levothyroxine  25 mcg Oral QAC breakfast  . mouth rinse  15 mL Mouth Rinse BID  . metoprolol tartrate  25 mg Oral BID  . pantoprazole  40 mg Oral Q0600  . sodium chloride flush  3 mL Intravenous Q12H   Continuous Infusions: . sodium chloride    . piperacillin-tazobactam (ZOSYN)  IV Stopped (09/01/17 1509)    Procedures/Studies: Dg Chest Port 1 View  Result Date:  08/31/2017 CLINICAL DATA:  Acute diastolic CHF.  Atrial fibrillation. EXAM: PORTABLE CHEST 1 VIEW COMPARISON:  08/28/2017 FINDINGS: Stable cardiomegaly with aortic atherosclerosis. Confluent airspace opacities with air bronchograms in the right upper lobe compatible with developing pneumonia superimposed on chronic CHF. Small bilateral pleural effusions. No acute osseous abnormality. Left-sided pacemaker with right atrial and right ventricular leads. IMPRESSION: 1. Stable cardiomegaly.  Stable aortic atherosclerosis. 2. Superimposed on pre-existing CHF are confluent airspace opacities in the right upper lobe with air bronchograms consistent with an evolving pneumonia. Electronically Signed   By: Ashley Royalty M.D.   On: 08/31/2017 02:12   Dg Chest [facility] [address]  Result Date: 08/28/2017 CLINICAL DATA:  Shortness of breath today. EXAM: PORTABLE CHEST 1 VIEW COMPARISON:  08/12/2017 FINDINGS: Left-sided pacemaker in place. Cardiomegaly with tortuous atherosclerotic aorta. Development of diffuse bilateral airspace opacities, right greater than left. Probable small pleural effusions. No pneumothorax. Stable osseous structures. IMPRESSION: 1. Development of diffuse bilateral airspace opacities, more prominent on the right. Pulmonary edema and CHF is favored over multifocal pneumonia. 2. Again seen cardiomegaly and small pleural effusions. Electronically Signed   By: Jeb Levering M.D.   On: 08/28/2017 05:12   Dg Chest Port 1 View  Result Date: 08/12/2017 CLINICAL DATA:  Shortness of breath with exertion. Ongoing for 2-3 weeks. No chest pain. EXAM: PORTABLE CHEST 1 VIEW COMPARISON:  07/21/2017 FINDINGS: There is mild bilateral chronic interstitial thickening. There is no focal parenchymal opacity. There is no pleural effusion or pneumothorax. There is stable cardiomegaly. There is a dual lead cardiac pacemaker. The osseous structures are unremarkable. IMPRESSION: No active disease. Electronically Signed   By: Kathreen Devoid   On: 08/12/2017 17:48    Orson Eva, DO  [facility] Pager [phone]  If 7PM-7AM, please contact night-coverage [email or web address] Password [email or web address] 09/01/2017, 4:53 PM   LOS: 4 days

## 2017-09-02 LAB — MAGNESIUM: Magnesium: 1.7 mg/dL (ref 1.7–2.4)

## 2017-09-02 LAB — CBC
HEMATOCRIT: 24.5 % — AB (ref 39.0–52.0)
Hemoglobin: 7.2 g/dL — ABNORMAL LOW (ref 13.0–17.0)
MCH: 28.9 pg (ref 26.0–34.0)
MCHC: 29.4 g/dL — AB (ref 30.0–36.0)
MCV: 98.4 fL (ref 78.0–100.0)
Platelets: 149 10*3/uL — ABNORMAL LOW (ref 150–400)
RBC: 2.49 MIL/uL — ABNORMAL LOW (ref 4.22–5.81)
RDW: 21.1 % — AB (ref 11.5–15.5)
WBC: 5.3 10*3/uL (ref 4.0–10.5)

## 2017-09-02 LAB — BASIC METABOLIC PANEL
Anion gap: 10 (ref 5–15)
BUN: 28 mg/dL — AB (ref 6–20)
CHLORIDE: 97 mmol/L — AB (ref 101–111)
CO2: 31 mmol/L (ref 22–32)
CREATININE: 1.43 mg/dL — AB (ref 0.61–1.24)
Calcium: 7.8 mg/dL — ABNORMAL LOW (ref 8.9–10.3)
GFR calc Af Amer: 50 mL/min — ABNORMAL LOW (ref >60)
GFR calc non Af Amer: 43 mL/min — ABNORMAL LOW (ref >60)
GLUCOSE: 135 mg/dL — AB (ref 65–99)
Potassium: 3.6 mmol/L (ref 3.5–5.1)
Sodium: 138 mmol/L (ref 135–145)

## 2017-09-02 MED ORDER — METOPROLOL SUCCINATE ER 50 MG PO TB24
50.0000 mg | ORAL_TABLET | Freq: Every day | ORAL | Status: DC
  Administered 2017-09-03: 50 mg via ORAL
  Filled 2017-09-02: qty 1

## 2017-09-02 MED ORDER — MAGNESIUM SULFATE 2 GM/50ML IV SOLN
2.0000 g | Freq: Once | INTRAVENOUS | Status: AC
Start: 2017-09-02 — End: 2017-09-02
  Administered 2017-09-02: 2 g via INTRAVENOUS
  Filled 2017-09-02: qty 50

## 2017-09-02 MED ORDER — FUROSEMIDE 40 MG PO TABS: 40.0000 mg | ORAL_TABLET | Freq: Two times a day (BID) | ORAL | 0 refills | Status: AC

## 2017-09-02 MED ORDER — METOPROLOL SUCCINATE ER 50 MG PO TB24: 50.0000 mg | ORAL_TABLET | Freq: Every day | ORAL | 1 refills | Status: AC

## 2017-09-02 MED ORDER — AMOXICILLIN-POT CLAVULANATE 875-125 MG PO TABS: 1.0000 | ORAL_TABLET | Freq: Two times a day (BID) | ORAL | 0 refills | Status: DC

## 2017-09-02 MED ORDER — AMOXICILLIN-POT CLAVULANATE 875-125 MG PO TABS
1.0000 | ORAL_TABLET | Freq: Two times a day (BID) | ORAL | Status: DC
  Administered 2017-09-02 – 2017-09-03 (×2): 1 via ORAL
  Filled 2017-09-02 (×2): qty 1

## 2017-09-02 NOTE — Plan of Care (Signed)
  Acute Rehab PT Goals(only PT should resolve) Pt Will Go Supine/Side To Sit 09/02/2017 1211 - Progressing by Lonell Grandchild, PT Flowsheets Taken 09/02/2017 1211  Pt will go Supine/Side to Sit with supervision Patient Will Transfer Sit To/From Stand 09/02/2017 1211 - Progressing by Lonell Grandchild, PT Flowsheets Taken 09/02/2017 1211  Patient will transfer sit to/from stand with supervision Pt Will Transfer Bed To Chair/Chair To Bed 09/02/2017 1211 - Progressing by Lonell Grandchild, PT Flowsheets Taken 09/02/2017 1211  Pt will Transfer Bed to Chair/Chair to Bed with supervision Pt Will Ambulate 09/02/2017 1211 - Progressing by Lonell Grandchild, PT Flowsheets Taken 09/02/2017 1211  Pt will Ambulate 50 feet;with minimal assist;with rolling walker  12:12 PM, 09/02/17 Lonell Grandchild, MPT Physical Therapist with [facility] [phone] office [phone] mobile phone

## 2017-09-02 NOTE — Evaluation (Signed)
Clinical/Bedside Swallow Evaluation Patient Details  Name: Richard [name] MRN: [id number] Date of Birth: Apr 25, 1931  Today's Date: 09/02/2017 Time: SLP Start Time (ACUTE ONLY): 1000 SLP Stop Time (ACUTE ONLY): 1030 SLP Time Calculation (min) (ACUTE ONLY): 30 min  Past Medical History:  Past Medical History:  Diagnosis Date  . Anxiety   . Arthritis   . Atrial fibrillation ([place])    a. Dx 03/2013, notes report atrial fibrillation/atrial flutter, placed on amiodarone. NSR in subsequent OV's.  . B12 deficiency anemia   . Cancer ([place])   . Chest pain    a. H/o CTA negative for PE 2012, normal cath 2005, normal nucs previously including 05/2012.  Marland Kitchen Complete heart block (H[facility])    a. s/p Medtronic Adapta L model ADDRL 1 (serial number [id number]) pacemaker.  . Dementia   . GERD (gastroesophageal reflux disease)   . Hiatal hernia   . Hyperlipidemia   . Hypertension   . Iron deficiency anemia   . LBBB (left bundle branch block)   . Microcytic anemia   . Nephrolithiasis   . On home oxygen therapy    a. 2L w/CPAP at night  . Orthostatic hypotension   . OSA on CPAP    setting = 4  . Pacemaker   . [place] spotted fever ~ 1945  . Sinus drainage   . Stroke ([place]) 05/2016  . Type II diabetes mellitus ([place])    Past Surgical History:  Past Surgical History:  Procedure Laterality Date  . CARDIAC CATHETERIZATION     by Dr. Acie Fredrickson, January 24, 2004, that shows minimal coronary artery irregularities and normal left ventricular function  . CATARACT EXTRACTION W/ INTRAOCULAR LENS  IMPLANT, BILATERAL Bilateral   . COLONOSCOPY WITH PROPOFOL N/A 05/23/2016   Procedure: COLONOSCOPY WITH PROPOFOL;  Surgeon: Jerene Bears, MD;  Location: [facility];  Service: Gastroenterology;  Laterality: N/A;  . ENTEROSCOPY N/A 06/02/2016   Procedure: ENTEROSCOPY;  Surgeon: Gatha Mayer, MD;  Location: [facility];  Service: Endoscopy;  Laterality: N/A;  . ESOPHAGOGASTRODUODENOSCOPY N/A 05/22/2016   Procedure:  ESOPHAGOGASTRODUODENOSCOPY (EGD);  Surgeon: Jerene Bears, MD;  Location[facility];  Service: Endoscopy;  Laterality: N/A;  . ESOPHAGOGASTRODUODENOSCOPY (EGD) WITH PROPOFOL N/A 06/21/2015   Procedure: ESOPHAGOGASTRODUODENOSCOPY (EGD) WITH PROPOFOL;  Surgeon: Gatha Mayer, MD;  Location: [facility];  Service: Endoscopy;  Laterality: N/A;  . GIVENS CAPSULE STUDY N/A 05/23/2016   Procedure: GIVENS CAPSULE STUDY;  Surgeon: Jerene Bears, MD;  Location: [facility];  Service: Gastroenterology;  Laterality: N/A;  . HOT HEMOSTASIS N/A 06/02/2016   Procedure: HOT HEMOSTASIS (ARGON PLASMA COAGULATION/BICAP);  Surgeon: Gatha Mayer, MD;  Location: [facility];  Service: Endoscopy;  Laterality: N/A;  . INGUINAL HERNIA REPAIR Right   . IR RADIOLOGIST EVAL & MGMT  11/25/2016  . IR RADIOLOGIST EVAL & MGMT  01/22/2017  . IR VERTEBROPLASTY CERV/THOR BX INC UNI/BIL INC/INJECT/IMAGING  01/30/2017  . IR VERTEBROPLASTY EA ADDL (T&LS) BX INC UNI/BIL INC INJECT/IMAGING  11/28/2016  . IR VERTEBROPLASTY EA ADDL (T&LS) BX INC UNI/BIL INC INJECT/IMAGING  01/30/2017  . IR VERTEBROPLASTY LUMBAR BX INC UNI/BIL INC/INJECT/IMAGING  11/28/2016  . LUMBAR DISC SURGERY  ~ 1993  . PERMANENT PACEMAKER INSERTION N/A 06/03/2014   MDT Adapta L implanted by Dr Rayann Heman for syncope and transient AV block  . SKIN CANCER EXCISION     "lower lip" (04/06/2013)  . TEMPORARY PACEMAKER INSERTION N/A 05/31/2014   Procedure: TEMPORARY WIRE;  Surgeon: Belva Crome  III, MD;  Location: [facility];  Service: Cardiovascular;  Laterality: N/A;  . VASECTOMY     Hx of    HPI:  84 y.o.malewith medical history ofdiabetes mellitus, stroke, complete heart block status post permanent pacemaker, hypertension, hyperlipidemia, defibrillation, obstructive sleep apnea presented with a one-day history of forcing shortness of breath. The patient is a poor historian secondary to his dementia. All of this history is obtained from the medical record and speaking  with the patient's spouse at the bedside. Apparently, the patient has had increasing generalized weakness and shortness of breath with exertion for the better part of the last1week. The patient has had 3 hospitalizations in the past 3 months. He was most recently discharged from the hospital after a stay from 08/12/17 through 08/15/17 when the patient had symptomatic anemia resulting innear syncope. The patient also had a hospitalization from 05/22/17 through 05/25/17 for acute on chronic systolic and diastolic CHF. Since his last hospitalization, the patient continues to have mechanical falls at home the most recent approximately 1 week prior to this admission. Because of his progressive shortness of breath, EMS was activated. The patient was noted to be in distress, he was placed on BiPAP in ED. Although the patient initially improved, he decompensated in the afternoon of 08/30/2017.Repeat chest x-ray showed bilateral confluent airspace opacities as well as pulmonary edema. The patient was started on intravenous antibiotics and moved to the stepdown unit and placed back on BiPAP. BSE requested.   Assessment / Plan / Recommendation Clinical Impression  Clinical swallow evaluation completed at bedside with wife present. Pt and wife deny that Pt has difficulty swallowing, however after further probing his wife reports that Pt does occasionally cough after drinking water at times. Oral motor examination reveals xerostomia and Pt reports that he is on fluid restrictions at this time. Pt wears U/L dentures, but states that he has been unable to remove the upper dentures "for a couple days". No gross asymmetry or oral weakness observed. Pt exhibited delayed coughing after self presenting dry graham crackers and straw sips thin water. SLP provided education regarding aspiration precautions and correlation between respiration and swallow. Pt endorses difficulty masticating meats and SLP offered to change diet  to mechanical soft or chopped meats. SLP assisted in providing oral care, but was unable to remove upper denture. Pt has been using mouthwash and SLP provided toothbrush. Pt is at risk for aspiration given compromised respiratory status and bed bound status. Pt and wife were encouraged to implement aspiration and reflux precautions (alert and upright for all eating and drinking and remain upright for 30+ minutes after, consume foods slowly, and take small bite/sips). Pt and spouse acknowledge understanding and all questions answered. SLP will sign off at this time.    SLP Visit Diagnosis: Dysphagia, unspecified (R13.10)    Aspiration Risk  Mild aspiration risk    Diet Recommendation Dysphagia 3 (Mech soft);Thin liquid   Liquid Administration via: Cup;Straw Medication Administration: Whole meds with puree Supervision: Staff to assist with self feeding;Full supervision/cueing for compensatory strategies Compensations: Small sips/bites Postural Changes: Seated upright at 90 degrees;Remain upright for at least 30 minutes after po intake    Other  Recommendations Oral Care Recommendations: Oral care BID;Staff/trained caregiver to provide oral care Other Recommendations: Clarify dietary restrictions   Follow up Recommendations None      Frequency and Duration            Prognosis Prognosis for Safe Diet Advancement: Fair Barriers to Reach Goals:  Cognitive deficits      Swallow Study   General Date of Onset: 08/28/17 HPI: 84 y.o.malewith medical history ofdiabetes mellitus, stroke, complete heart block status post permanent pacemaker, hypertension, hyperlipidemia, defibrillation, obstructive sleep apnea presented with a one-day history of forcing shortness of breath. The patient is a poor historian secondary to his dementia. All of this history is obtained from the medical record and speaking with the patient's spouse at the bedside. Apparently, the patient has had increasing generalized  weakness and shortness of breath with exertion for the better part of the last1week. The patient has had 3 hospitalizations in the past 3 months. He was most recently discharged from the hospital after a stay from 08/12/17 through 08/15/17 when the patient had symptomatic anemia resulting innear syncope. The patient also had a hospitalization from 05/22/17 through 05/25/17 for acute on chronic systolic and diastolic CHF. Since his last hospitalization, the patient continues to have mechanical falls at home the most recent approximately 1 week prior to this admission. Because of his progressive shortness of breath, EMS was activated. The patient was noted to be in distress, he was placed on BiPAP in ED. Although the patient initially improved, he decompensated in the afternoon of 08/30/2017.Repeat chest x-ray showed bilateral confluent airspace opacities as well as pulmonary edema. The patient was started on intravenous antibiotics and moved to the stepdown unit and placed back on BiPAP. BSE requested. Type of Study: Bedside Swallow Evaluation Diet Prior to this Study: Regular;Thin liquids Temperature Spikes Noted: No Respiratory Status: Nasal cannula History of Recent Intubation: No Behavior/Cognition: Alert;Cooperative;Pleasant mood Oral Cavity Assessment: Within Functional Limits;Dry Oral Care Completed by SLP: Yes Oral Cavity - Dentition: Dentures, top;Dentures, bottom Vision: Functional for self-feeding Self-Feeding Abilities: Able to feed self;Needs assist Patient Positioning: Upright in bed Baseline Vocal Quality: Normal Volitional Cough: Strong Volitional Swallow: Able to elicit    Oral/Motor/Sensory Function Overall Oral Motor/Sensory Function: Within functional limits   Ice Chips Ice chips: Within functional limits Presentation: Spoon   Thin Liquid Thin Liquid: Within functional limits Presentation: Cup;Self Fed;Straw    Nectar Thick Nectar Thick Liquid: Not tested   Honey  Thick Honey Thick Liquid: Not tested   Puree Puree: Within functional limits   Solid   GO   Solid: Impaired Presentation: Self Fed Oral Phase Impairments: Impaired mastication;Reduced lingual movement/coordination Oral Phase Functional Implications: Oral residue Pharyngeal Phase Impairments: Cough - Delayed       Thank you,  Genene Churn, [place]  [place] Gustafson 09/02/2017,10:32 AM

## 2017-09-02 NOTE — Discharge Summary (Signed)
Physician Discharge Summary  Richard [name] LFY:[id number] DOB: 1932/05/82 DOA: 08/28/2017  PCP: Prince Solian, MD  Admit date: 08/28/2017 Discharge date: 09/02/2017  Admitted From: Home Disposition:  Home   Recommendations for Outpatient Follow-up:  1. Follow up with PCP in 1-2 weeks 2. Please obtain BMP/CBC in one week   Home Health: YES Equipment/Devices: PT and hospice services  Discharge Condition: Stable CODE STATUS: DNR Diet recommendation: Heart Healthy    Brief/Interim Summary: 84 y.o.malewith medical history ofdiabetes mellitus, stroke, complete heart block status post permanent pacemaker, hypertension, hyperlipidemia, defibrillation, obstructive sleep apnea presented with a one-day history of forcing shortness of breath. The patient is a poor historian secondary to his dementia. All of this history is obtained from the medical record and speaking with the patient's spouse at the bedside. Apparently, the patient has had increasing generalized weakness and shortness of breath with exertion for the better part of the last1week. The patient has had 3 hospitalizations in the past 3 months. He was most recently discharged from the hospital after a stay from 08/12/17 through 08/15/17 when the patient had symptomatic anemia resulting innear syncope. The patient also had a hospitalization from 05/22/17 through 05/25/17 for acute on chronic systolic and diastolic CHF. Since his last hospitalization, the patient continues to have mechanical falls at home the most recent approximately 1 week prior to this admission. Because of his progressive shortness of breath, EMS was activated. The patient was noted to be in distress, he was placed on BiPAP in ED.  Notably, the patient was admitted to the hospital from 01/31/2017 through 02/01/2017 for a fall resulting in a head trauma. The patient had a left temporal and right occipital hematoma. The patient received KCentra at that time to  reverse his NOAC.Since that time, the patient has continued to have falling episodes. The patient had a follow-up CT of the head 2 weeks after his discharged in July. On 03/17/2017, repeat CT of the head revealed decreasing size of his right occipital and left temporal hemorrhage with no new focal hemorrhage. The patient has followed up with his primary care provider and notified his cardiologist of stopping his NOAC. Decision was made to keep the patient off of his apixaban.  Although the patient initially improved, he decompensated in the afternoon of 08/30/2017.Repeat chest x-ray showed bilateral confluent airspace opacities as well as pulmonary edema. The patient was started on intravenous antibiotics and moved to the stepdown unit and placed back on BiPAP. He as started on abx for HCAP.  He was weaned off BiPAP and transferred back to medical floor on 09/02/17.   Discharge Diagnoses:  Acute on chronic systolic and diastolic CHF -The patientwasup at least 5 pounds from his last admission -daily weights--not accurate--wide variations -I/Os no accurate due to urine incontinence -fluid restrict -continueIV Lasix40 mg IVq 8 hours>>>po lasix 40 mg bid on 09/02/17 -08/13/2017 echo--EF 45-50%, grade 3 DD, HK inferior septal, anteroseptal, and inferior myocardium -continue metoprolol-->change to metoprolol succinate -09/02/17 weight 202 lbs  Acute respiratory failure with hypoxia -Secondary to CHFand pneumonia -BiPAP>>weaned to3L -Wean oxygen as tolerated -08/30/17 repeat CXR--personally reviewed with bilateral airspace opacities and increased interstitial markings.  Fever/HCAP/Lobar Pneumonia -had temp101.0 F evening of 08/29/2017 -UA-neg pyuria -MRSA neg -continue zosyn>>>amox/clav on 1/9 -home with amox/clav x 3 more days to finish 7 days of tx  ParoxysmalAtrial fibrillation -Presently in sinus/pacedrhythm -The patient is not a good candidate for anticoagulation at this point  secondary to his frequent falls and recent head trauma  resulting in bleeding -In addition, the patient has had a history of small bowel AVMs -Continue amiodarone and metoprolol tartrate -his cardiologist was trying to arrange for Watchman procedure, but pt has been too weak to go through eval -Continue amiodarone -03/17/2017, repeat CT of the head revealed decreasing size of his right occipital and left temporal hemorrhage with no new focal hemorrhage.   CKD stage 3 -baseline creatinine 0.9-1.3 -monitor with diuresis -AM BMP  Essential hypertension -Continue metoprolol tartrate>>>metoprolol succinate  Generalized weakness -B12--1104 -TSH--2.021 -PT eval-->SNF  Complete Heart Block -s/p PPM  Hyperlipidemia -Continue statin  Diabetes mellitus type 2 -diet controlled -check A1C--5.7 -d/c CBGs and ISS  Hypomagnesemia -repleted  Hypothyroidism--Euthyroid sick syndrome -08/21/17 TSH 5.041 -09/01/17 THS 5.042 -check free T4--1.04 -not currently on synthroid  Depression/anxiety -ContinueCymbaltaat home dose of Valium  OSA -he is compliant with CPAP at thome  [place] -long discussion with spouse-->DNR -08/29/17--another discussion with spouse--"I don't want him to suffer, but I'm not ready to let him go" -08/29/17--family meeting with son, grand daughter -palliative medicine following--->likely home with hospice care      Discharge Instructions   Allergies as of 09/02/2017      Reactions   Codeine Other (See Comments)   Hallucinations   Citalopram Swelling, Rash      Medication List    STOP taking these medications   cyanocobalamin 1000 MCG/ML injection Commonly known as:  (VITAMIN B-12)   metoprolol tartrate 25 MG tablet Commonly known as:  LOPRESSOR     TAKE these medications   acetaminophen 500 MG tablet Commonly known as:  TYLENOL Take 500 mg by mouth 2 (two) times daily.   amiodarone 200 MG tablet Commonly known as:  PACERONE Take 200  mg by mouth every Monday, Wednesday, and Friday.   amoxicillin-clavulanate 875-125 MG tablet Commonly known as:  AUGMENTIN Take 1 tablet by mouth every 12 (twelve) hours.   aspirin EC 81 MG tablet Take 1 tablet (81 mg total) by mouth daily.   atorvastatin 40 MG tablet Commonly known as:  LIPITOR Take 1 tablet (40 mg total) by mouth daily at 6 PM.   diazepam 5 MG tablet Commonly known as:  VALIUM Take 0.5 tablets (2.5 mg total) by mouth at bedtime.   divalproex 250 MG DR tablet Commonly known as:  DEPAKOTE Take 1 tablet by mouth 2 (two) times daily.   DULoxetine 60 MG capsule Commonly known as:  CYMBALTA Take 60 mg by mouth every evening.   ferrous sulfate 325 (65 FE) MG tablet Take 325 mg by mouth 2 (two) times daily with a meal.   fluticasone 50 MCG/ACT nasal spray Commonly known as:  FLONASE Place 1 spray into the nose daily as needed for allergies or rhinitis.   furosemide 40 MG tablet Commonly known as:  LASIX Take 1 tablet (40 mg total) by mouth 2 (two) times daily. What changed:  when to take this   levothyroxine 50 MCG tablet Commonly known as:  SYNTHROID, LEVOTHROID Take 50 mcg by mouth daily before breakfast.   metoprolol succinate 50 MG 24 hr tablet Commonly known as:  TOPROL-XL Take 1 tablet (50 mg total) by mouth daily. Take with or immediately following a meal. Start taking on:  09/03/2017   pantoprazole 40 MG tablet Commonly known as:  PROTONIX Take 1 tablet (40 mg total) by mouth daily at 6 (six) AM.   potassium chloride 10 MEQ tablet Commonly known as:  K-DUR Take 2 tablets (20 mEq total) by mouth daily.  Allergies  Allergen Reactions  . Codeine Other (See Comments)    Hallucinations   . Citalopram Swelling and Rash    Consultations:  Palliative medicine   Procedures/Studies: Dg Chest Port 1 View  Result Date: 08/31/2017 CLINICAL DATA:  Acute diastolic CHF.  Atrial fibrillation. EXAM: PORTABLE CHEST 1 VIEW COMPARISON:   08/28/2017 FINDINGS: Stable cardiomegaly with aortic atherosclerosis. Confluent airspace opacities with air bronchograms in the right upper lobe compatible with developing pneumonia superimposed on chronic CHF. Small bilateral pleural effusions. No acute osseous abnormality. Left-sided pacemaker with right atrial and right ventricular leads. IMPRESSION: 1. Stable cardiomegaly.  Stable aortic atherosclerosis. 2. Superimposed on pre-existing CHF are confluent airspace opacities in the right upper lobe with air bronchograms consistent with an evolving pneumonia. Electronically Signed   By: Ashley Royalty M.D.   On: 08/31/2017 02:12   Dg Chest Port 1 View  Result Date: 08/28/2017 CLINICAL DATA:  Shortness of breath today. EXAM: PORTABLE CHEST 1 VIEW COMPARISON:  08/12/2017 FINDINGS: Left-sided pacemaker in place. Cardiomegaly with tortuous atherosclerotic aorta. Development of diffuse bilateral airspace opacities, right greater than left. Probable small pleural effusions. No pneumothorax. Stable osseous structures. IMPRESSION: 1. Development of diffuse bilateral airspace opacities, more prominent on the right. Pulmonary edema and CHF is favored over multifocal pneumonia. 2. Again seen cardiomegaly and small pleural effusions. Electronically Signed   By: Jeb Levering M.D.   On: 08/28/2017 05:12   Dg Chest Port 1 View  Result Date: 08/12/2017 CLINICAL DATA:  Shortness of breath with exertion. Ongoing for 2-3 weeks. No chest pain. EXAM: PORTABLE CHEST 1 VIEW COMPARISON:  07/21/2017 FINDINGS: There is mild bilateral chronic interstitial thickening. There is no focal parenchymal opacity. There is no pleural effusion or pneumothorax. There is stable cardiomegaly. There is a dual lead cardiac pacemaker. The osseous structures are unremarkable. IMPRESSION: No active disease. Electronically Signed   By: Kathreen Devoid   On: 08/12/2017 17:48        Discharge Exam: Vitals:   09/02/17 1110 09/02/17 1300  BP:  (!)  145/69  Pulse: 78 76  Resp: (!) 21 19  Temp: 97.7 F (36.5 C)   SpO2: 95% 99%   Vitals:   09/02/17 1000 09/02/17 1100 09/02/17 1110 09/02/17 1300  BP: 137/63 (!) 141/63  (!) 145/69  Pulse: 74 76 78 76  Resp: (!) 21 19 (!) 21 19  Temp:   97.7 F (36.5 C)   TempSrc:   Oral   SpO2: 100% 99% 95% 99%  Weight:      Height:        General: Pt is alert, awake, not in acute distress Cardiovascular: RRR, S1/S2 +, no rubs, no gallops Respiratory: CTA bilaterally, no wheezing, no rhonchi Abdominal: Soft, NT, ND, bowel sounds + Extremities: no edema, no cyanosis   The results of significant diagnostics from this hospitalization (including imaging, microbiology, ancillary and laboratory) are listed below for reference.    Significant Diagnostic Studies: Dg Chest Port 1 View  Result Date: 08/31/2017 CLINICAL DATA:  Acute diastolic CHF.  Atrial fibrillation. EXAM: PORTABLE CHEST 1 VIEW COMPARISON:  08/28/2017 FINDINGS: Stable cardiomegaly with aortic atherosclerosis. Confluent airspace opacities with air bronchograms in the right upper lobe compatible with developing pneumonia superimposed on chronic CHF. Small bilateral pleural effusions. No acute osseous abnormality. Left-sided pacemaker with right atrial and right ventricular leads. IMPRESSION: 1. Stable cardiomegaly.  Stable aortic atherosclerosis. 2. Superimposed on pre-existing CHF are confluent airspace opacities in the right upper lobe with air bronchograms  consistent with an evolving pneumonia. Electronically Signed   By: Ashley Royalty M.D.   On: 08/31/2017 02:12   Dg Chest Port 1 View  Result Date: 08/28/2017 CLINICAL DATA:  Shortness of breath today. EXAM: PORTABLE CHEST 1 VIEW COMPARISON:  08/12/2017 FINDINGS: Left-sided pacemaker in place. Cardiomegaly with tortuous atherosclerotic aorta. Development of diffuse bilateral airspace opacities, right greater than left. Probable small pleural effusions. No pneumothorax. Stable osseous  structures. IMPRESSION: 1. Development of diffuse bilateral airspace opacities, more prominent on the right. Pulmonary edema and CHF is favored over multifocal pneumonia. 2. Again seen cardiomegaly and small pleural effusions. Electronically Signed   By: Jeb Levering M.D.   On: 08/28/2017 05:12   Dg Chest Port 1 View  Result Date: 08/12/2017 CLINICAL DATA:  Shortness of breath with exertion. Ongoing for 2-3 weeks. No chest pain. EXAM: PORTABLE CHEST 1 VIEW COMPARISON:  07/21/2017 FINDINGS: There is mild bilateral chronic interstitial thickening. There is no focal parenchymal opacity. There is no pleural effusion or pneumothorax. There is stable cardiomegaly. There is a dual lead cardiac pacemaker. The osseous structures are unremarkable. IMPRESSION: No active disease. Electronically Signed   By: Kathreen Devoid   On: 08/12/2017 17:48     Microbiology: Recent Results (from the past 240 hour(s))  Culture, Urine     Status: None   Collection Time: 08/30/17 11:02 AM  Result Value Ref Range Status   Specimen Description URINE, CLEAN CATCH  Final   Special Requests NONE  Final   Culture   Final    NO GROWTH Performed at [facility], [address] [place], [place] [address]    Report Status 09/01/2017 FINAL  Final  MRSA PCR Screening     Status: None   Collection Time: 08/30/17  6:55 PM  Result Value Ref Range Status   MRSA by PCR NEGATIVE NEGATIVE Final    Comment:        The GeneXpert MRSA Assay (FDA approved for NASAL specimens only), is one component of a comprehensive MRSA colonization surveillance program. It is not intended to diagnose MRSA infection nor to guide or monitor treatment for MRSA infections.      Labs: Basic Metabolic Panel: Recent Labs  Lab 08/29/17 0553 08/30/17 0556 08/30/17 2242 08/31/17 0415 09/01/17 0431 09/02/17 0507  NA 139 141  --  138 136 138  K 4.2 4.0  --  3.5 3.6 3.6  CL 106 104  --  98* 96* 97*  CO2 25 27  --  28 31 31   GLUCOSE  120* 110*  --  100* 120* 135*  BUN 22* 28*  --  29* 27* 28*  CREATININE 1.06 1.19  --  1.28* 1.30* 1.43*  CALCIUM 8.0* 8.3*  --  8.0* 7.7* 7.8*  MG  --  1.6* 1.8  --  1.9 1.7   Liver Function Tests: No results for input(s): AST, ALT, ALKPHOS, BILITOT, PROT, ALBUMIN in the last 168 hours. No results for input(s): LIPASE, AMYLASE in the last 168 hours. No results for input(s): AMMONIA in the last 168 hours. CBC: Recent Labs  Lab 08/28/17 0446 08/28/17 0612 08/30/17 2242 08/31/17 0415 09/01/17 0431 09/02/17 0507  WBC 6.5  --  5.6 4.5 4.8 5.3  NEUTROABS 4.0  --   --   --   --   --   HGB 9.8* 8.8* 7.5* 7.4* 7.7* 7.2*  HCT 32.9* 26.0* 24.7* 24.6* 25.3* 24.5*  MCV 97.1  --  96.1 96.5 95.5 98.4  PLT 159  --  155 148* 147* 149*   Cardiac Enzymes: No results for input(s): CKTOTAL, CKMB, CKMBINDEX, TROPONINI in the last 168 hours. BNP: Invalid input(s): POCBNP CBG: Recent Labs  Lab 08/29/17 1157 08/29/17 1601 08/29/17 2050 08/30/17 0741 08/30/17 1649  GLUCAP 112* 183* 148* 101* 120*    Time coordinating discharge:  Greater than 30 minutes  Signed:  Orson Eva, DO [facility] Pager: [phone] 09/02/2017, 5:57 PM

## 2017-09-02 NOTE — Evaluation (Signed)
Physical Therapy Evaluation Patient Details Name: Richard [name] MRN: [id number] DOB: 1931/01/04 Today's Date: 09/02/2017   History of Present Illness  Richard [name] is a 84 y.o. male with medical history of diabetes mellitus, stroke, complete heart block status post permanent pacemaker, hypertension, hyperlipidemia, defibrillation, obstructive sleep apnea presented with a one-day history of forcing shortness of breath. The patient is a poor historian secondary to his dementia. All of this history is obtained from the medical record and speaking with the patient's spouse at the bedside. Apparently, the patient has had increasing generalized weakness and shortness of breath with exertion for the better part of the last 1 week.  The patient has had 3 hospitalizations in the past 3 months.  He was most recently discharged from the hospital after a stay from 08/12/17 through 08/15/17 when the patient had symptomatic anemia resulting and near syncope.  The patient also had a hospitalization from 05/22/17 through 05/25/17 for acute on chronic systolic and diastolic CHF.  Since his last hospitalization, the patient continues to have mechanical falls at home the most recent approximately 1 week prior to this admission.  Because of his progressive shortness of breath, EMS was activated.  The patient was noted to be in distress, he was placed on BiPAP.    Clinical Impression  Patient required encouragement by family to participate,  Demonstrates labored movement and limited to a few steps at bedside due to fatigue, fair/poor standing balance, patient constantly states he is OK and wants to go home.  Patient will benefit from continued physical therapy in hospital and recommended venue below to increase strength, balance, endurance for safe ADLs and gait.    Follow Up Recommendations SNF;Supervision/Assistance - 24 hour    Equipment Recommendations  None recommended by PT    Recommendations for Other Services        Precautions / Restrictions Precautions Precautions: Fall Restrictions Weight Bearing Restrictions: No      Mobility  Bed Mobility Overal bed mobility: Needs Assistance Bed Mobility: Supine to Sit;Sit to Supine     Supine to sit: Min guard Sit to supine: Min assist      Transfers Overall transfer level: Needs assistance Equipment used: Rolling walker (2 wheeled) Transfers: Sit to/from Stand Sit to Stand: Min assist            Ambulation/Gait Ambulation/Gait assistance: Museum/gallery curator (Feet): 3 Feet Assistive device: Rolling walker (2 wheeled) Gait Pattern/deviations: Decreased step length - right;Decreased step length - left;Decreased stride length   Gait velocity interpretation: Below normal speed for age/gender General Gait Details: limited to 4-5 steps forward/backwards at bedside due to fatigue, although patient states he is "Gaffer    Modified Rankin (Stroke Patients Only)       Balance Overall balance assessment: Needs assistance Sitting-balance support: Feet supported;No upper extremity supported Sitting balance-Leahy Scale: Fair     Standing balance support: Bilateral upper extremity supported;During functional activity Standing balance-Leahy Scale: Fair Standing balance comment: fair/poor with RW                             Pertinent Vitals/Pain Pain Assessment: No/denies pain    Home Living Family/patient expects to be discharged to:: Private residence Living Arrangements: Spouse/significant other Available Help at Discharge: Family;Available 24 hours/day Type of Home: House Home Access: Ramped entrance;Stairs to enter Entrance Stairs-Rails: Right;Left(cannot  reach both) Entrance Stairs-Number of Steps: 2-3 Home Layout: One level;Multi-level Home Equipment: Walker - 2 wheels;Cane - single point;Bedside commode;Hospital bed;Shower seat;Wheelchair - power       Prior Function Level of Independence: Needs assistance   Gait / Transfers Assistance Needed: Ambulates with RW and spouse assisting  ADL's / Homemaking Assistance Needed: assisted by spouse for all ADLs. Has home aide come 2 days/wk to assist as well        Hand Dominance        Extremity/Trunk Assessment   Upper Extremity Assessment Upper Extremity Assessment: Generalized weakness    Lower Extremity Assessment Lower Extremity Assessment: Generalized weakness    Cervical / Trunk Assessment Cervical / Trunk Assessment: Kyphotic  Communication   Communication: No difficulties  Cognition Arousal/Alertness: Awake/alert Behavior During Therapy: WFL for tasks assessed/performed;Anxious(requires encouragement to participate) Overall Cognitive Status: Within Functional Limits for tasks assessed                                        General Comments      Exercises     Assessment/Plan    PT Assessment Patient needs continued PT services  PT Problem List Decreased strength;Decreased activity tolerance;Decreased balance;Decreased mobility       PT Treatment Interventions Gait training;Functional mobility training;Therapeutic activities;Therapeutic exercise;Patient/family education    PT Goals (Current goals can be found in the Care Plan section)  Acute Rehab PT Goals Patient Stated Goal: Return home PT Goal Formulation: With patient/family Time For Goal Achievement: 09/16/17 Potential to Achieve Goals: Good    Frequency Min 3X/week   Barriers to discharge        Co-evaluation               AM-PAC PT "6 Clicks" Daily Activity  Outcome Measure Difficulty turning over in bed (including adjusting bedclothes, sheets and blankets)?: A Little Difficulty moving from lying on back to sitting on the side of the bed? : A Little Difficulty sitting down on and standing up from a chair with arms (e.g., wheelchair, bedside commode, etc,.)?: A  Little Help needed moving to and from a bed to chair (including a wheelchair)?: A Little Help needed walking in hospital room?: A Lot Help needed climbing 3-5 steps with a railing? : A Lot 6 Click Score: 16    End of Session Equipment Utilized During Treatment: Oxygen Activity Tolerance: Patient tolerated treatment well;Patient limited by fatigue Patient left: in bed;with call bell/phone within reach;with family/visitor present Nurse Communication: Mobility status PT Visit Diagnosis: Unsteadiness on feet (R26.81);Other abnormalities of gait and mobility (R26.89);Muscle weakness (generalized) (M62.81)    Time: 1115-1140 PT Time Calculation (min) (ACUTE ONLY): 25 min   Charges:   PT Evaluation $PT Eval Moderate Complexity: 1 Mod PT Treatments $Therapeutic Activity: 23-37 mins   PT G Codes:        12:09 PM, 2017/09/09 Lonell Grandchild, MPT Physical Therapist with [facility] [phone] office [phone] mobile phone

## 2017-09-02 NOTE — Care Management (Addendum)
Discussed with palliative NP. Family would like a referral made to [facility]. Ellen-patient's wife provided her cell number as [phone].    Anticipate DC date 1/10 per attending notes. Wife states they have a personal care assistant once or twice a week to help with errands.   Home DME equipment includes: Angela Burke, bedside commode, hospital bed, shower seat, scale and wheelchair, and home O2.  Discussed with Cassandra of [facility] via phone. CM faxing all information.

## 2017-09-02 NOTE — Care Management (Signed)
Patient Information   Patient Name Richard [name], Richard [name] ([id number]) Sex Male DOB 1931/04/18  Room Bed  IC06 IC06-01  Patient Demographics   Address [address] [place] [address] Phone [phone] [facility]) [phone] (Mobile)  Patient Ethnicity & Race   Ethnic Group Patient Race  Not Hispanic or Latino White or Caucasian  Emergency Contact(s)   Name Relation Home Work Mobile  Richard [name] Spouse [phone]    Richard [name] Daughter [phone]    Documents on File    Status Date Received Description  Documents for the Patient  EMR Patient Summary Not Received    Driver's License Received 58/52/77   [place] HIPAA NOTICE OF PRIVACY - Scanned Received 06/06/11 315  [place] E-Signature HIPAA Notice of Privacy Received 06/13/11   [place] E-Signature HIPAA Notice of Privacy Spanish Not Received    Insurance Card Not Received  315  Advance Directives/Living Will/HCPOA/POA Not Received    Editor, commissioning Not Received    Insurance Card Received 06/06/11   [place] HIPAA NOTICE OF PRIVACY - Scanned Not Received    HIM ROI Authorization Not Received    Release of Information Not Received    HIM ROI Authorization Not Received    Release of Information Not Received    [place] Received 06/20/11 [id number]  [place] HIPAA NOTICE OF PRIVACY - Scanned Not Received    AMB Correspondence Not Received  11/12 office note [place] Card Not Received    HIM ROI Authorization Not Received  PGBA-2 [place] Rule Audit  [place] HIPAA NOTICE OF PRIVACY - Scanned Not Received    Insurance Card Not Received    AMB Correspondence Not Received  08/14 Office Note [facility]  AMB Correspondence  10/07/13 02/15 office note [facility]  AMB Correspondence  10/21/13 01/15 office note [facility]  AMB Correspondence  11/17/13 02/15 [facility]  AMB Provider Completed Forms  02/16/14 TREATMENT ORDER [place] MD, S  AMB  Correspondence  04/03/14 OFFICE NOTE [facility]  AMB Correspondence  07/14/14 RECALL ASSESSMENT [place] Card   09/21/14  Other Photo ID Not Received    AMB New Patient Records/Historical  01/25/15 06/16 REFERRAL [facility]  AMB Correspondence  02/27/15 OFFICE NOTE [facility] MD,Z  AMB Correspondence  04/27/15 OFFICE NOTE [place] MD,Z  HIM ROI Authorization  05/18/15 Verbal Request Form  AMB Correspondence  05/25/15 NOTES  Insurance Card Received 05/29/16 MEDICARE/[place] OF [place]  AMB Correspondence  06/08/15 OFFICE NOTE [facility]  AMB Correspondence  05/24/15 PROGRESS NOTE [facility]  Release of Information Received 02/01/16 [facility] DPR  AMB Correspondence  03/31/16 03/28/16 OFFICE NOTE [facility]  AMB Correspondence  03/28/16 OFFICE NOTES [place]  Advanced Beneficiary Notice (ABN) Not Received    E-Signature AOB Spanish Not Received    Release of Information Received 05/28/16 DPR 10.03.2017  Release of Information Received 05/28/16 DPR 10.03.2017  [place] E-Signature HIPAA Notice of Privacy Signed 05/29/16   Release of Information Received 05/30/16 [facility] DPR   AMB HH/NH/Hospice  05/20/16 PROFESSIONAL COMMUNICATION [facility]  AMB Correspondence  05/21/16 OFFICE NOTES [facility]  AMB HH/NH/Hospice  82/42/35 CERTIFICATION/POC [facility]  AMB Correspondence  09/05/16 OFFICE NOTE [facility]  AMB Correspondence  09/05/16 OFFICE NOTES [facility]  AMB Correspondence  10/24/16 [facility]  AMB Correspondence  10/23/16 REFERRAL FORM [facility]  AMB Correspondence  12/15/16 ANNUAL PHYSICAL EXAM [place] Card Received 07/21/17 New Card  [place] HIPAA NOTICE OF PRIVACY - Scanned Received 07/21/17 301 rec 07/21/2017  Insurance Card Received 07/21/17 301 rec 07/21/2017  AMB HH/NH/Hospice Received 07/22/17 ORDER [facility]  AMB Correspondence Received 08/05/17 OFFICE VISIT [facility]  AMB Correspondence Not Received (Deleted)  08/14 [place] [facility]  AMB Correspondence (Deleted) 06/08/15 NOTE  AMB Correspondence (Deleted) 09/04/15 OFFICE NOTE  Documents for the Encounter  AOB (Assignment of Insurance Benefits) Not Received    E-signature AOB Signed 08/28/17   MEDICARE RIGHTS Not Received    E-signature Medicare Rights Signed 08/28/17   ED Patient Billing Extract   ED PB Summary  Cardiac Monitoring Strip Shift Summary Received 08/28/17   Cardiac Monitoring Strip Received 08/28/17   EKG Received 08/28/17   Admission Information   Attending Provider Admitting Provider Admission Type Admission Date/Time  [name], Richard Brow, MD Richard Eva, MD Emergency 08/28/17 [phone]  Discharge Date Hospital Service Auth/Cert Status Service Area   Internal Medicine Incomplete [place]  Unit Room/Bed Admission Status   AP-ICCUP NURSING IC06/IC06-01 Admission (Confirmed)   Admission   Complaint  Shortness of [place] [facility] Account   Name Acct ID Class Status Primary Coverage  Richard [name] [id number] Inpatient Open MEDICARE - MEDICARE PART A AND B      Guarantor Account (for Hospital Account [id number])   Name Relation to Pt Service Area Active? Acct Type  Richard [name] Self CHSA Yes Personal/Family  Address Phone    Richard [name], [place] [address] [phone])        Coverage Information (for Hospital Account [id number])   1. Richard [name] PART A AND B   F/O Payor/Plan Precert #  MEDICARE/MEDICARE PART A AND B   Subscriber Subscriber #  Richard [name] [id number]  Address Phone  [address] [place], [place] [address]   2. [place]UAL OF [place]/MUTUAL OF [facility] MCR SUP   F/O Payor/Plan Precert #  MUTUAL OF [facility] OF [facility] SUP   Subscriber Subscriber #  Richard [name] [id number]  Address Phone  [address] [place] [address]

## 2017-09-02 NOTE — Progress Notes (Signed)
Daily Progress Note   Patient Name: Richard [name]       Date: 09/02/2017 DOB: Apr 17, 1931  Age: 84 y.o. MRN#: [id number] Attending Physician: Orson Eva, MD Primary Care Physician: Prince Solian, MD Admit Date: 08/28/2017  Reason for Consultation/Follow-up: Establishing goals of care, Hospice Evaluation and Psychosocial/spiritual support  Subjective: Richard [name] is lying quietly in bed.  He is sleeping soundly, but wakes easily.  He has known dementia, but is able to make his needs well known.  Calm, cooperative.  I share with him that I am glad that he is out of the intensive care, this is one day closer to home.  He smiles.  Present today at bedside his wife of 36 years, Richard [name], daughter Richard [name] and her daughter Richard [name], and son Richard [name].  We talked about the good son that Richard [name] is stable enough to leave the intensive care.  We talked about PT evaluation, recommending SNF rehab.  Family asks if they are required to take rehab, and I share that as always, they decide what is right for Richard [name].  I share that rehab is being offered, but is not mandated.   wife and family states they feel Richard [name] would do better at home, I agree due to his dementia.  Daughter Richard [name] states that hospice has called Richard [name].  I encouraged family to be present when the hospice provider comes to discuss services.  I shared that it is normal to not hear or understand everything, and therefore family support is very important during the visit.  This is [place] talks about returning the bed they have now through advanced home care, and getting the bed from hospice delivered.  Son Letitia Libra states that he can be at the home, available to help with pickup and delivery. Case discussed with case manager related  to disposition home, equipment needs.  Length of Stay: 5  Current Medications: Scheduled Meds:  . amiodarone  200 mg Oral Q M,W,F  . amoxicillin-clavulanate  1 tablet Oral Q12H  . aspirin EC  81 mg Oral Daily  . atorvastatin  40 mg Oral q1800  . divalproex  250 mg Oral BID  . DULoxetine  60 mg Oral QPM  . enoxaparin (LOVENOX) injection  40 mg Subcutaneous Q24H  . feeding supplement (ENSURE ENLIVE)  237 mL  Oral BID BM  . ferrous sulfate  325 mg Oral BID WC  . furosemide  40 mg Oral BID  . levothyroxine  25 mcg Oral QAC breakfast  . mouth rinse  15 mL Mouth Rinse BID  . [START ON 09/03/2017] metoprolol succinate  50 mg Oral Daily  . metoprolol tartrate  25 mg Oral BID  . pantoprazole  40 mg Oral Q0600  . sodium chloride flush  3 mL Intravenous Q12H    Continuous Infusions: . sodium chloride    . piperacillin-tazobactam (ZOSYN)  IV Stopped (09/02/17 1450)    PRN Meds: sodium chloride, acetaminophen, diazepam, ondansetron (ZOFRAN) IV, ondansetron (ZOFRAN) IV, oxyCODONE, sodium chloride flush  Physical Exam  Constitutional: He appears ill.  Appears frail, acutely/chronically ill  HENT:  Head: Atraumatic.  Cardiovascular: Normal rate and regular rhythm.  Pulmonary/Chest: Effort normal. No respiratory distress.  Abdominal: Soft.  Obese abdomen  Musculoskeletal:       Right lower leg: He exhibits edema.       Left lower leg: He exhibits edema.  Neurological: He is alert.  Sleepy, awakes easily, known dementia  Skin: Skin is warm and dry.  Psychiatric: His mood appears not anxious. He is not agitated.  Nursing note and vitals reviewed.           Vital Signs: BP (!) 145/69   Pulse 76   Temp 97.7 F (36.5 C) (Oral)   Resp 19   Ht 6\' 3"  (1.905 m)   Wt 91.8 kg (202 lb 6.1 oz)   SpO2 99%   BMI 25.30 kg/m  SpO2: SpO2: 99 % O2 Device: O2 Device: Nasal Cannula O2 Flow Rate: O2 Flow Rate (L/min): 8 L/min  Intake/output summary:   Intake/Output Summary (Last 24 hours)  at 09/02/2017 1604 Last data filed at 09/02/2017 1201 Gross per 24 hour  Intake 203 ml  Output 750 ml  Net -547 ml   LBM: Last BM Date: 09/02/17 Baseline Weight: Weight: 88.5 kg (195 lb) Most recent weight: Weight: 91.8 kg (202 lb 6.1 oz)       Palliative Assessment/Data:    Flowsheet Rows     Most Recent Value  Intake Tab  Referral Department  Hospitalist  Unit at Time of Referral  ICU  Palliative Care Primary Diagnosis  Cardiac  Date Notified  08/31/17  Palliative Care Type  New Palliative care  Reason for referral  Clarify Goals of Care  Date of Admission  08/28/17  Date first seen by Palliative Care  08/31/17  # of days Palliative referral response time  0 Day(s)  # of days IP prior to Palliative referral  3  Clinical Assessment  Palliative Performance Scale Score  20%  Pain Max last 24 hours  Not able to report  Pain Min Last 24 hours  Not able to report  Dyspnea Max Last 24 Hours  Not able to report  Dyspnea Min Last 24 hours  Not able to report  Psychosocial & Spiritual Assessment  Palliative Care Outcomes  Patient/Family meeting held?  Yes  Who was at the meeting?  Patient and wife at bedside  Patient/Family wishes: Interventions discontinued/not started   Mechanical Ventilation      Patient Active Problem List   Diagnosis Date Noted  . Encounter for hospice care discussion   . Palliative care encounter   . Goals of care, counseling/discussion   . Lobar pneumonia ([place]) 08/30/2017  . Pulmonary edema 08/28/2017  . Acute on chronic combined systolic and diastolic  CHF (congestive heart failure) ([place]) 08/28/2017  . Acute diastolic CHF (congestive heart failure) ([place]) 05/22/2017  . CKD (chronic kidney disease) stage 3, GFR 30-59 ml/min (HCC) 05/22/2017  . Acute respiratory failure with hypoxia ([place]) 05/22/2017  . Intracranial hemorrhage ([place]) 03/03/2017  . Head trauma 03/02/2017  . Chronic diastolic CHF (congestive heart failure) ([place]) 11/16/2016  . AVM  (arteriovenous malformation) of small bowel, acquired ([place])   . Abnormality of gait as late effect of stroke 05/27/2016  . Benign neoplasm of cecum   . Benign neoplasm of sigmoid colon   . GI bleeding 05/22/2016  . Melena   . GIB (gastrointestinal bleeding) 05/21/2016  . Diabetes mellitus with complication ([place])   . Upper GI bleed   . CKD (chronic kidney disease)   . Type 2 diabetes mellitus with circulatory disorder, without long-term current use of insulin ([place])   . Acute right PCA stroke ([place]) 05/08/2016  . Ataxia due to recent stroke   . Gait disturbance, post-stroke   . Cerebellar stroke, acute ([place]) 05/07/2016  . Hyperlipidemia   . Chronic anticoagulation   . Cardiac pacemaker in situ   . Acute blood loss anemia   . Cognitive deficits   . Cognitive deficit due to recent stroke   . Stroke-related cognitive dysfunction   . Generalized weakness 05/05/2016  . Dizziness   . Absolute anemia 02/08/2016  . Weakness 02/01/2016  . Gastric polyp 06/21/2015  . Iron deficiency anemia due to chronic blood loss   . GERD (gastroesophageal reflux disease)   . B12 deficiency anemia   . Bradycardia with less than 30 beats per minute 05/31/2014  . Complete heart block ([place]) 05/31/2014  . Syncope 05/31/2014  . Anemia 05/31/2014  . Atrial fibrillation ([place]) 04/08/2013  . Diabetes mellitus ([place]) 04/06/2013  . Hiatal hernia 04/06/2013  . Essential hypertension 06/13/2011  . OSA (obstructive sleep apnea) 06/13/2011    Palliative Care Assessment & Plan   Patient Profile: 84 y.o.malewith past medical history of atrial fibrillation, complete heart block with pacemaker, heart failure, dementia, high blood pressure and cholesterol, anxiety, anemia, history of stroke, type 2 diabetesadmitted on 1/4/2019with acute on chronic systolic and diastolic heart failure.  Assessment: acute on chronic systolic and diastolic heart failure: IV Lasix 3 times per day, diuresing, respiratory status is  improving, no meaningful improvements at this point.  Family is requesting 48 hours for continued improvements.  Recommendations/Plan:  At this point, continue to treat the treatable with the goal of return of function as possible.  Home with the benefits of hospice of [facility] upon discharge.  Goals of Care and Additional Recommendations:  Limitations on Scope of Treatment: Continue to treat the treatable, no CPR, no intubation  Code Status:    Code Status Orders  (From admission, onward)        Start     Ordered   08/28/17 1505  Do not attempt resuscitation (DNR)  Continuous    Question Answer Comment  In the event of cardiac or respiratory ARREST Do not call a "code blue"   In the event of cardiac or respiratory ARREST Do not perform Intubation, CPR, defibrillation or ACLS   In the event of cardiac or respiratory ARREST Use medication by any route, position, wound care, and other measures to relive pain and suffering. May use oxygen, suction and manual treatment of airway obstruction as needed for comfort.      08/28/17 1504    Code Status History    Date  Active Date Inactive Code Status Order ID Comments User Context   08/28/2017 07:32 08/28/2017 15:04 DNR [id number]  Orson Eva, MD ED   08/12/2017 22:57 08/15/2017 19:30 Full Code [id number]  Phillips Grout, MD Inpatient   05/22/2017 18:14 05/25/2017 19:01 Full Code [id number]  Orson Eva, MD Inpatient   03/03/2017 01:38 03/03/2017 21:32 Full Code [id number]  Karmen Bongo, MD Inpatient   05/21/2016 14:59 05/24/2016 16:48 Full Code [id number]  Rondel Jumbo, PA-C ED   05/08/2016 19:27 05/17/2016 13:38 Full Code [id number]  Bary Leriche, PA-C Inpatient   05/08/2016 19:27 05/08/2016 19:27 Full Code [id number]  Bary Leriche, PA-C Inpatient   05/05/2016 16:40 05/08/2016 18:24 Full Code [id number]  Rondel Jumbo, PA-C ED   05/05/2016 16:19 05/05/2016 16:40 Full Code [id number]  Rondel Jumbo, PA-C ED   06/03/2014 13:43 06/04/2014  14:04 Full Code [id number]  Thompson Grayer, MD Inpatient   05/31/2014 16:29 06/03/2014 13:43 Full Code [id number]  Charlie Pitter, PA-C Inpatient    Advance Directive Documentation     Most Recent Value  Type of Advance Directive  Living will  Pre-existing out of facility DNR order (yellow form or pink MOST form)  No data  "MOST" Form in Place?  No data       Prognosis:   < 3 months or less would not be surprising based on functional status, severity of heart problems,low albumin at 2.5.   Discharge Planning:  Home with the benefits of [facility] in home services  Care plan was discussed with nursing staff, case management, social worker, and Dr. Carles Collet.  Thank you for allowing the Palliative Medicine Team to assist in the care of this patient.   Time In:  1530 Time Out:  1555 Total Time  25 minutes Prolonged Time Billed  no       Greater than 50%  of this time was spent counseling and coordinating care related to the above assessment and plan.  Drue Novel, NP  Please contact Palliative Medicine Team phone at [phone] for questions and concerns.

## 2017-09-02 NOTE — Progress Notes (Signed)
SLP Cancellation Note  Patient Details Name: Richard [name] MRN: [id number] DOB: 07/03/1931   Cancelled treatment:       Reason Eval/Treat Not Completed: Other (comment)(Pt currently completing self care with nursing staff); SLP will check back later.  Thank you,  Genene Churn, [place]    [place] 09/02/2017, 8:47 AM

## 2017-09-02 NOTE — Progress Notes (Signed)
PROGRESS NOTE  Richard [name] PJK:[id number] DOB: Feb 20, 1931 DOA: 08/28/2017 PCP: Prince Solian, MD  Brief History: 84 y.o.malewith medical history ofdiabetes mellitus, stroke, complete heart block status post permanent pacemaker, hypertension, hyperlipidemia, defibrillation, obstructive sleep apnea presented with a one-day history of forcing shortness of breath. The patient is a poor historian secondary to his dementia. All of this history is obtained from the medical record and speaking with the patient's spouse at the bedside. Apparently, the patient has had increasing generalized weakness and shortness of breath with exertion for the better part of the last1week. The patient has had 3 hospitalizations in the past 3 months. He was most recently discharged from the hospital after a stay from 08/12/17 through 08/15/17 when the patient had symptomatic anemia resulting innear syncope. The patient also had a hospitalization from 05/22/17 through 05/25/17 for acute on chronic systolic and diastolic CHF. Since his last hospitalization, the patient continues to have mechanical falls at home the most recent approximately 1 week prior to this admission. Because of his progressive shortness of breath, EMS was activated. The patient was noted to be in distress, he was placed on BiPAP in ED.  Notably, the patient was admitted to the hospital from 01/31/2017 through 02/01/2017 for a fall resulting in a head trauma. The patient had a left temporal and right occipital hematoma. The patient received KCentra at that time to reverse his NOAC.Since that time, the patient has continued to have falling episodes. The patient had a follow-up CT of the head 2 weeks after his discharged in July. On 03/17/2017, repeat CT of the head revealed decreasing size of his right occipital and left temporal hemorrhage with no new focal hemorrhage. The patient has followed up with his primary care provider and  notified his cardiologist of stopping his NOAC. Decision was made to keep the patient off of his apixaban.  Although the patient initially improved, he decompensated in the afternoon of 08/30/2017.Repeat chest x-ray showed bilateral confluent airspace opacities as well as pulmonary edema. The patient was started on intravenous antibiotics and moved to the stepdown unit and placed back on BiPAP.   Assessment/Plan: Acute on chronic systolic and diastolic CHF -The patient was up at least 5 pounds from his last admission -daily weights--not accurate--wide variations -I/Os no accurate due to urine incontinence -fluid restrict -continueIV Lasix40 mg IVq 8 hours>>>po lasix on 09/02/17 -08/13/2017 echo--EF 45-50%, grade 3 DD, HK inferior septal, anteroseptal, and inferior myocardium -continue metoprolol-->change to metoprolol succinate  Acute respiratory failure with hypoxia -Secondary to CHF and pneumonia -BiPAP>>weaned to3L -Wean oxygen as tolerated -08/30/17 repeat CXR--personally reviewed with bilateral airspace opacities and increased interstitial markings.  Fever/HCAP/Lobar Pneumonia -had temp101.0 F evening of 08/29/2017 -UA-neg pyuria -MRSA neg -continue zosyn>>>amox/clav on 1/9  ParoxysmalAtrial fibrillation -Presently in sinus/paced rhythm -The patient is not a good candidate for anticoagulation at this point secondary to his frequent falls and recent head trauma resulting in bleeding -In addition, the patient has had a history of small bowel AVMs -Continue amiodarone and metoprolol tartrate -his cardiologist was trying to arrange for Watchman procedure, but pt has been too weak to go through eval -Continue amiodarone -03/17/2017, repeat CT of the head revealed decreasing size of his right occipital and left temporal hemorrhage with no new focal hemorrhage.   CKD stage 3 -baseline creatinine 0.9-1.3 -monitor with diuresis -AM BMP  Essential  hypertension -Continue metoprolol tartrate>>>metoprolol succinate  Generalized weakness -B12--1104 -TSH--2.021 -PT eval  Complete Heart Block -s/p PPM  Hyperlipidemia -Continue statin  Diabetes mellitus type 2 -diet controlled -check A1C--5.7 -d/c CBGs and ISS  Hypomagnesemia -repleted  Hypothyroidism--Euthyroid sick syndrome -08/21/17 TSH 5.041 -check free T4--1.04 -not currently on synthroid--likely need restart pending labs  Depression/anxiety -ContinueCymbaltaat home dose of Valium  OSA -he is compliant with CPAP at thome  [place] -long discussion with spouse-->DNR -08/29/17--another discussion with spouse--"I don't want him to suffer, but I'm not ready to let him go" -08/29/17--family meeting with son, grand daughter,    Disposition Plan:Home with hospice likely 09/03/17 Family Communication:Spouseand familyupdatedat bedside1/9   Consultants:palliative medicine  Code Status: DNR  DVT Prophylaxis: [place] Lovenox   Procedures: As Listed in Progress Note Above  Antibiotics: Vancomycin 08/30/17 Zosyn 08/30/17>>>1/9 Amox/clav 1/9>>>     Subjective: Patient denies fevers, chills, headache, chest pain, dyspnea, nausea, vomiting, diarrhea, abdominal pain, dysuria, hematuria, hematochezia, and melena.   Objective: Vitals:   09/02/17 0800 09/02/17 0900 09/02/17 1000 09/02/17 1110  BP: (!) 153/84 120/71 137/63   Pulse: 71 73 74 78  Resp: 18 (!) 24 (!) 21 (!) 21  Temp:    97.7 F (36.5 C)  TempSrc:    Oral  SpO2: 98% 95% 100% 95%  Weight:      Height:        Intake/Output Summary (Last 24 hours) at 09/02/2017 1117 Last data filed at 09/02/2017 0000 Gross per 24 hour  Intake 153 ml  Output 1500 ml  Net -1347 ml   Weight change: -0.4 kg (-14.1 oz) Exam:   General:  Pt is alert, follows commands appropriately, not in acute distress  HEENT: No icterus, No thrush, No neck mass, [place]/AT  Cardiovascular: RRR, S1/S2, no  rubs, no gallops  Respiratory: Bibasilar crackles.  No wheezing.  Good air movement  Abdomen: Soft/+BS, non tender, non distended, no guarding  Extremities: No edema, No lymphangitis, No petechiae, No rashes, no synovitis   Data Reviewed: I have personally reviewed following labs and imaging studies Basic Metabolic Panel: Recent Labs  Lab 08/29/17 0553 08/30/17 0556 08/30/17 2242 08/31/17 0415 09/01/17 0431 09/02/17 0507  NA 139 141  --  138 136 138  K 4.2 4.0  --  3.5 3.6 3.6  CL 106 104  --  98* 96* 97*  CO2 25 27  --  28 31 31   GLUCOSE 120* 110*  --  100* 120* 135*  BUN 22* 28*  --  29* 27* 28*  CREATININE 1.06 1.19  --  1.28* 1.30* 1.43*  CALCIUM 8.0* 8.3*  --  8.0* 7.7* 7.8*  MG  --  1.6* 1.8  --  1.9 1.7   Liver Function Tests: No results for input(s): AST, ALT, ALKPHOS, BILITOT, PROT, ALBUMIN in the last 168 hours. No results for input(s): LIPASE, AMYLASE in the last 168 hours. No results for input(s): AMMONIA in the last 168 hours. Coagulation Profile: No results for input(s): INR, PROTIME in the last 168 hours. CBC: Recent Labs  Lab 08/28/17 0446 08/28/17 0612 08/30/17 2242 08/31/17 0415 09/01/17 0431  WBC 6.5  --  5.6 4.5 4.8  NEUTROABS 4.0  --   --   --   --   HGB 9.8* 8.8* 7.5* 7.4* 7.7*  HCT 32.9* 26.0* 24.7* 24.6* 25.3*  MCV 97.1  --  96.1 96.5 95.5  PLT 159  --  155 148* 147*   Cardiac Enzymes: No results for input(s): CKTOTAL, CKMB, CKMBINDEX, TROPONINI in the last 168 hours. BNP: Invalid input(s): POCBNP  CBG: Recent Labs  Lab 08/29/17 1157 08/29/17 1601 08/29/17 2050 08/30/17 0741 08/30/17 1649  GLUCAP 112* 183* 148* 101* 120*   HbA1C: No results for input(s): HGBA1C in the last 72 hours. Urine analysis:    Component Value Date/Time   COLORURINE YELLOW 08/30/2017 1102   APPEARANCEUR CLEAR 08/30/2017 1102   LABSPEC 1.015 08/30/2017 1102   PHURINE 5.0 08/30/2017 1102   GLUCOSEU NEGATIVE 08/30/2017 1102   HGBUR LARGE (A)  08/30/2017 1102   BILIRUBINUR NEGATIVE 08/30/2017 1102   KETONESUR NEGATIVE 08/30/2017 1102   PROTEINUR 30 (A) 08/30/2017 1102   UROBILINOGEN 0.2 03/30/2014 1400   NITRITE NEGATIVE 08/30/2017 1102   LEUKOCYTESUR NEGATIVE 08/30/2017 1102   Sepsis Labs: @LABRCNTIP (procalcitonin:4,lacticidven:4) ) Recent Results (from the past 240 hour(s))  Culture, Urine     Status: None   Collection Time: 08/30/17 11:02 AM  Result Value Ref Range Status   Specimen Description URINE, CLEAN CATCH  Final   Special Requests NONE  Final   Culture   Final    NO GROWTH Performed at [facility], [place] [address] [place], [place] [address]    Report Status 09/01/2017 FINAL  Final  MRSA PCR Screening     Status: None   Collection Time: 08/30/17  6:55 PM  Result Value Ref Range Status   MRSA by PCR NEGATIVE NEGATIVE Final    Comment:        The GeneXpert MRSA Assay (FDA approved for NASAL specimens only), is one component of a comprehensive MRSA colonization surveillance program. It is not intended to diagnose MRSA infection nor to guide or monitor treatment for MRSA infections.      Scheduled Meds: . amiodarone  200 mg Oral Q M,W,F  . aspirin EC  81 mg Oral Daily  . atorvastatin  40 mg Oral q1800  . divalproex  250 mg Oral BID  . DULoxetine  60 mg Oral QPM  . enoxaparin (LOVENOX) injection  40 mg Subcutaneous Q24H  . feeding supplement (ENSURE ENLIVE)  237 mL Oral BID BM  . ferrous sulfate  325 mg Oral BID WC  . furosemide  40 mg Oral BID  . levothyroxine  25 mcg Oral QAC breakfast  . mouth rinse  15 mL Mouth Rinse BID  . metoprolol tartrate  25 mg Oral BID  . pantoprazole  40 mg Oral Q0600  . sodium chloride flush  3 mL Intravenous Q12H   Continuous Infusions: . sodium chloride    . magnesium sulfate 1 - 4 g bolus IVPB    . piperacillin-tazobactam (ZOSYN)  IV 3.375 g (09/02/17 1050)    Procedures/Studies: Dg Chest Port 1 View  Result Date: 08/31/2017 CLINICAL DATA:  Acute  diastolic CHF.  Atrial fibrillation. EXAM: PORTABLE CHEST 1 VIEW COMPARISON:  08/28/2017 FINDINGS: Stable cardiomegaly with aortic atherosclerosis. Confluent airspace opacities with air bronchograms in the right upper lobe compatible with developing pneumonia superimposed on chronic CHF. Small bilateral pleural effusions. No acute osseous abnormality. Left-sided pacemaker with right atrial and right ventricular leads. IMPRESSION: 1. Stable cardiomegaly.  Stable aortic atherosclerosis. 2. Superimposed on pre-existing CHF are confluent airspace opacities in the right upper lobe with air bronchograms consistent with an evolving pneumonia. Electronically Signed   By: Ashley Royalty M.D.   On: 08/31/2017 02:12   Dg Chest Port 1 View  Result Date: 08/28/2017 CLINICAL DATA:  Shortness of breath today. EXAM: PORTABLE CHEST 1 VIEW COMPARISON:  08/12/2017 FINDINGS: Left-sided pacemaker in place. Cardiomegaly with tortuous atherosclerotic aorta.  Development of diffuse bilateral airspace opacities, right greater than left. Probable small pleural effusions. No pneumothorax. Stable osseous structures. IMPRESSION: 1. Development of diffuse bilateral airspace opacities, more prominent on the right. Pulmonary edema and CHF is favored over multifocal pneumonia. 2. Again seen cardiomegaly and small pleural effusions. Electronically Signed   By: Jeb Levering M.D.   On: 08/28/2017 05:12   Dg Chest Port 1 View  Result Date: 08/12/2017 CLINICAL DATA:  Shortness of breath with exertion. Ongoing for 2-3 weeks. No chest pain. EXAM: PORTABLE CHEST 1 VIEW COMPARISON:  07/21/2017 FINDINGS: There is mild bilateral chronic interstitial thickening. There is no focal parenchymal opacity. There is no pleural effusion or pneumothorax. There is stable cardiomegaly. There is a dual lead cardiac pacemaker. The osseous structures are unremarkable. IMPRESSION: No active disease. Electronically Signed   By: Kathreen Devoid   On: 08/12/2017 17:48     Orson Eva, DO  [facility] Pager [phone]  If 7PM-7AM, please contact night-coverage [email or web address] Password [email or web address] 09/02/2017, 11:17 AM   LOS: 5 days

## 2017-09-02 NOTE — Care Management (Signed)
Richard [name], Richard [name]    Male  04/13/1931 (86 yrs)  [id number]  MRN: [id number]    Demographics [address] [place], [place] [address] Home: [phone]       Work:        Mobile: [phone]       Email: No Email Available     PCP: Prince Solian, MD (Gener* Employment:RETIRED - Retired           Patient Contacts Amaris Garrette (Spouse) Mitzi Hyatt (Daughter) Showing 2 of 2     [phone]        [place]  Add Guarantor                                   P/F Dajion, [place] [[id number]] Encounter guarantor    Add Coverage        Guarantor Demographics Address linked to patient Home: [phone] Rel to patient: Self      Work:     [employer]  Employment:RETIRED - Ret*    Prof acct balance: 0.00 Hosp acct balance: 0.00 Add Account Note                                     1. E-MEDICARE/MEDICARE PART *   Encounter coverage  E-Verified Response History         Subscriber Demographics Bureau,Brennan R Home: [phone]     Address linked to patient Work:                  Coverage Info Member ID: [id number] Group:  [id number] Rel to subscriber: Self     Subscriber ID: [id number] Effective from: 08/25/1997 Auth phone:                                    2. Ernestina Penna OF*/[employer] OF [facility]*   Encounter coverage  E-Verified Response History         Subscriber Demographics Dutkiewicz,Prospero R Home: [phone]     Address linked to patient Work:                  Coverage Info Member ID: [id number] Group:  BLANK - PLAN F Rel to subscriber: Self     Subscriber ID: [id number] Effective from: 08/25/2009 Auth phone: [phone]                                 Encounter Info

## 2017-09-03 DIAGNOSIS — I5021 Acute systolic (congestive) heart failure: Secondary | ICD-10-CM

## 2017-09-03 LAB — BASIC METABOLIC PANEL
Anion gap: 11 (ref 5–15)
BUN: 27 mg/dL — AB (ref 6–20)
CALCIUM: 8.1 mg/dL — AB (ref 8.9–10.3)
CHLORIDE: 97 mmol/L — AB (ref 101–111)
CO2: 30 mmol/L (ref 22–32)
CREATININE: 1.27 mg/dL — AB (ref 0.61–1.24)
GFR calc non Af Amer: 50 mL/min — ABNORMAL LOW (ref >60)
GFR, EST AFRICAN AMERICAN: 58 mL/min — AB (ref >60)
Glucose, Bld: 129 mg/dL — ABNORMAL HIGH (ref 65–99)
Potassium: 3.4 mmol/L — ABNORMAL LOW (ref 3.5–5.1)
Sodium: 138 mmol/L (ref 135–145)

## 2017-09-03 LAB — CBC
HCT: 26.1 % — ABNORMAL LOW (ref 39.0–52.0)
Hemoglobin: 7.9 g/dL — ABNORMAL LOW (ref 13.0–17.0)
MCH: 29 pg (ref 26.0–34.0)
MCHC: 30.3 g/dL (ref 30.0–36.0)
MCV: 96 fL (ref 78.0–100.0)
PLATELETS: 161 10*3/uL (ref 150–400)
RBC: 2.72 MIL/uL — AB (ref 4.22–5.81)
RDW: 21.2 % — AB (ref 11.5–15.5)
WBC: 4.3 10*3/uL (ref 4.0–10.5)

## 2017-09-03 NOTE — Progress Notes (Signed)
Patient is to discharge home today with hospice services.  Please see discharge summary by Dr. Carles Collet dated 1/9 for details.  Patient kept an extra day in order to arrange for hospice services.  Domingo Mend, MD [facility] Pager: [phone]

## 2017-09-03 NOTE — Care Management Note (Signed)
Case Management Note  Patient Details  Name: LOVELACE CERVENY MRN: [id number] Date of Birth: 04-18-1931   Expected Discharge Date:  09/03/17               Expected Discharge Plan:  Home w Hospice Care  In-House Referral:  Hospice / Palliative Care  Discharge planning Services  CM Consult  Post Acute Care Choice:  Hospice Choice offered to:   wife  HH Arranged:  RN [facility] Agency:  [facility]  Status of Service:  Completed, signed off  If discussed at [employer] of Stay Meetings, dates discussed:  09/03/2017  Additional Comments: Pt discharging home today with [facility]. Referral completed on 09/02/17 by CM Sharley Hunnicutt per her note. This CM has faxed DC summary to hospice rep. No DME needs prior to DC home. EMS transport has been arranged for 1pm today. DC plan discussed with pt's wife at the bedside. It was also discussed that all DME pt has pta may be switched out by hospice after pt returns home and is admitted to hospice services, that does not have to be switched out prior to DC.  Sherald Barge, RN 09/03/2017, 12:01 PM

## 2017-09-03 NOTE — Care Management Important Message (Signed)
Important Message  Patient Details  Name: Richard [name] MRN: [id number] Date of Birth: [birth year]   Medicare Important Message Given:  Yes    Sherald Barge, RN 09/03/2017, 12:08 PM

## 2017-09-04 DIAGNOSIS — I509 Heart failure, unspecified: Secondary | ICD-10-CM | POA: Diagnosis not present

## 2017-09-04 DIAGNOSIS — F0150 Vascular dementia without behavioral disturbance: Secondary | ICD-10-CM | POA: Diagnosis not present

## 2017-09-04 DIAGNOSIS — I519 Heart disease, unspecified: Secondary | ICD-10-CM | POA: Diagnosis not present

## 2017-09-04 DIAGNOSIS — I4891 Unspecified atrial fibrillation: Secondary | ICD-10-CM | POA: Diagnosis not present

## 2017-09-04 DIAGNOSIS — R531 Weakness: Secondary | ICD-10-CM | POA: Diagnosis not present

## 2017-09-04 DIAGNOSIS — R0602 Shortness of breath: Secondary | ICD-10-CM | POA: Diagnosis not present

## 2017-09-04 DIAGNOSIS — E039 Hypothyroidism, unspecified: Secondary | ICD-10-CM | POA: Diagnosis not present

## 2017-09-04 DIAGNOSIS — I10 Essential (primary) hypertension: Secondary | ICD-10-CM | POA: Diagnosis not present

## 2017-09-04 DIAGNOSIS — I455 Other specified heart block: Secondary | ICD-10-CM | POA: Diagnosis not present

## 2017-09-04 DIAGNOSIS — N183 Chronic kidney disease, stage 3 (moderate): Secondary | ICD-10-CM | POA: Diagnosis not present

## 2017-09-04 DIAGNOSIS — E119 Type 2 diabetes mellitus without complications: Secondary | ICD-10-CM | POA: Diagnosis not present

## 2017-09-04 DIAGNOSIS — E785 Hyperlipidemia, unspecified: Secondary | ICD-10-CM | POA: Diagnosis not present

## 2017-09-07 DIAGNOSIS — I519 Heart disease, unspecified: Secondary | ICD-10-CM | POA: Diagnosis not present

## 2017-09-07 DIAGNOSIS — E119 Type 2 diabetes mellitus without complications: Secondary | ICD-10-CM | POA: Diagnosis not present

## 2017-09-07 DIAGNOSIS — E039 Hypothyroidism, unspecified: Secondary | ICD-10-CM | POA: Diagnosis not present

## 2017-09-07 DIAGNOSIS — I10 Essential (primary) hypertension: Secondary | ICD-10-CM | POA: Diagnosis not present

## 2017-09-07 DIAGNOSIS — E785 Hyperlipidemia, unspecified: Secondary | ICD-10-CM | POA: Diagnosis not present

## 2017-09-07 DIAGNOSIS — I455 Other specified heart block: Secondary | ICD-10-CM | POA: Diagnosis not present

## 2017-09-09 DIAGNOSIS — I10 Essential (primary) hypertension: Secondary | ICD-10-CM | POA: Diagnosis not present

## 2017-09-09 DIAGNOSIS — E119 Type 2 diabetes mellitus without complications: Secondary | ICD-10-CM | POA: Diagnosis not present

## 2017-09-09 DIAGNOSIS — E039 Hypothyroidism, unspecified: Secondary | ICD-10-CM | POA: Diagnosis not present

## 2017-09-09 DIAGNOSIS — I455 Other specified heart block: Secondary | ICD-10-CM | POA: Diagnosis not present

## 2017-09-09 DIAGNOSIS — I519 Heart disease, unspecified: Secondary | ICD-10-CM | POA: Diagnosis not present

## 2017-09-09 DIAGNOSIS — E785 Hyperlipidemia, unspecified: Secondary | ICD-10-CM | POA: Diagnosis not present

## 2017-09-10 DIAGNOSIS — I519 Heart disease, unspecified: Secondary | ICD-10-CM | POA: Diagnosis not present

## 2017-09-10 DIAGNOSIS — I10 Essential (primary) hypertension: Secondary | ICD-10-CM | POA: Diagnosis not present

## 2017-09-10 DIAGNOSIS — I455 Other specified heart block: Secondary | ICD-10-CM | POA: Diagnosis not present

## 2017-09-10 DIAGNOSIS — E119 Type 2 diabetes mellitus without complications: Secondary | ICD-10-CM | POA: Diagnosis not present

## 2017-09-10 DIAGNOSIS — E785 Hyperlipidemia, unspecified: Secondary | ICD-10-CM | POA: Diagnosis not present

## 2017-09-10 DIAGNOSIS — E039 Hypothyroidism, unspecified: Secondary | ICD-10-CM | POA: Diagnosis not present

## 2017-09-11 DIAGNOSIS — E119 Type 2 diabetes mellitus without complications: Secondary | ICD-10-CM | POA: Diagnosis not present

## 2017-09-11 DIAGNOSIS — E785 Hyperlipidemia, unspecified: Secondary | ICD-10-CM | POA: Diagnosis not present

## 2017-09-11 DIAGNOSIS — E039 Hypothyroidism, unspecified: Secondary | ICD-10-CM | POA: Diagnosis not present

## 2017-09-11 DIAGNOSIS — I519 Heart disease, unspecified: Secondary | ICD-10-CM | POA: Diagnosis not present

## 2017-09-11 DIAGNOSIS — I10 Essential (primary) hypertension: Secondary | ICD-10-CM | POA: Diagnosis not present

## 2017-09-11 DIAGNOSIS — I455 Other specified heart block: Secondary | ICD-10-CM | POA: Diagnosis not present

## 2017-09-14 DIAGNOSIS — I10 Essential (primary) hypertension: Secondary | ICD-10-CM | POA: Diagnosis not present

## 2017-09-14 DIAGNOSIS — E039 Hypothyroidism, unspecified: Secondary | ICD-10-CM | POA: Diagnosis not present

## 2017-09-14 DIAGNOSIS — I455 Other specified heart block: Secondary | ICD-10-CM | POA: Diagnosis not present

## 2017-09-14 DIAGNOSIS — I519 Heart disease, unspecified: Secondary | ICD-10-CM | POA: Diagnosis not present

## 2017-09-14 DIAGNOSIS — E119 Type 2 diabetes mellitus without complications: Secondary | ICD-10-CM | POA: Diagnosis not present

## 2017-09-14 DIAGNOSIS — E785 Hyperlipidemia, unspecified: Secondary | ICD-10-CM | POA: Diagnosis not present

## 2017-09-15 DIAGNOSIS — E039 Hypothyroidism, unspecified: Secondary | ICD-10-CM | POA: Diagnosis not present

## 2017-09-15 DIAGNOSIS — E119 Type 2 diabetes mellitus without complications: Secondary | ICD-10-CM | POA: Diagnosis not present

## 2017-09-15 DIAGNOSIS — I10 Essential (primary) hypertension: Secondary | ICD-10-CM | POA: Diagnosis not present

## 2017-09-15 DIAGNOSIS — I519 Heart disease, unspecified: Secondary | ICD-10-CM | POA: Diagnosis not present

## 2017-09-15 DIAGNOSIS — I455 Other specified heart block: Secondary | ICD-10-CM | POA: Diagnosis not present

## 2017-09-15 DIAGNOSIS — E785 Hyperlipidemia, unspecified: Secondary | ICD-10-CM | POA: Diagnosis not present

## 2017-09-16 DIAGNOSIS — E039 Hypothyroidism, unspecified: Secondary | ICD-10-CM | POA: Diagnosis not present

## 2017-09-16 DIAGNOSIS — E785 Hyperlipidemia, unspecified: Secondary | ICD-10-CM | POA: Diagnosis not present

## 2017-09-16 DIAGNOSIS — I519 Heart disease, unspecified: Secondary | ICD-10-CM | POA: Diagnosis not present

## 2017-09-16 DIAGNOSIS — E119 Type 2 diabetes mellitus without complications: Secondary | ICD-10-CM | POA: Diagnosis not present

## 2017-09-16 DIAGNOSIS — I10 Essential (primary) hypertension: Secondary | ICD-10-CM | POA: Diagnosis not present

## 2017-09-16 DIAGNOSIS — I455 Other specified heart block: Secondary | ICD-10-CM | POA: Diagnosis not present

## 2017-09-17 DIAGNOSIS — E039 Hypothyroidism, unspecified: Secondary | ICD-10-CM | POA: Diagnosis not present

## 2017-09-17 DIAGNOSIS — E119 Type 2 diabetes mellitus without complications: Secondary | ICD-10-CM | POA: Diagnosis not present

## 2017-09-17 DIAGNOSIS — E785 Hyperlipidemia, unspecified: Secondary | ICD-10-CM | POA: Diagnosis not present

## 2017-09-17 DIAGNOSIS — I455 Other specified heart block: Secondary | ICD-10-CM | POA: Diagnosis not present

## 2017-09-17 DIAGNOSIS — I10 Essential (primary) hypertension: Secondary | ICD-10-CM | POA: Diagnosis not present

## 2017-09-17 DIAGNOSIS — I519 Heart disease, unspecified: Secondary | ICD-10-CM | POA: Diagnosis not present

## 2017-09-18 DIAGNOSIS — E039 Hypothyroidism, unspecified: Secondary | ICD-10-CM | POA: Diagnosis not present

## 2017-09-18 DIAGNOSIS — I10 Essential (primary) hypertension: Secondary | ICD-10-CM | POA: Diagnosis not present

## 2017-09-18 DIAGNOSIS — E785 Hyperlipidemia, unspecified: Secondary | ICD-10-CM | POA: Diagnosis not present

## 2017-09-18 DIAGNOSIS — I455 Other specified heart block: Secondary | ICD-10-CM | POA: Diagnosis not present

## 2017-09-18 DIAGNOSIS — I519 Heart disease, unspecified: Secondary | ICD-10-CM | POA: Diagnosis not present

## 2017-09-18 DIAGNOSIS — E119 Type 2 diabetes mellitus without complications: Secondary | ICD-10-CM | POA: Diagnosis not present

## 2017-09-21 DIAGNOSIS — I455 Other specified heart block: Secondary | ICD-10-CM | POA: Diagnosis not present

## 2017-09-21 DIAGNOSIS — E119 Type 2 diabetes mellitus without complications: Secondary | ICD-10-CM | POA: Diagnosis not present

## 2017-09-21 DIAGNOSIS — E039 Hypothyroidism, unspecified: Secondary | ICD-10-CM | POA: Diagnosis not present

## 2017-09-21 DIAGNOSIS — I519 Heart disease, unspecified: Secondary | ICD-10-CM | POA: Diagnosis not present

## 2017-09-21 DIAGNOSIS — I10 Essential (primary) hypertension: Secondary | ICD-10-CM | POA: Diagnosis not present

## 2017-09-21 DIAGNOSIS — E785 Hyperlipidemia, unspecified: Secondary | ICD-10-CM | POA: Diagnosis not present

## 2017-09-22 DIAGNOSIS — I519 Heart disease, unspecified: Secondary | ICD-10-CM | POA: Diagnosis not present

## 2017-09-22 DIAGNOSIS — E785 Hyperlipidemia, unspecified: Secondary | ICD-10-CM | POA: Diagnosis not present

## 2017-09-22 DIAGNOSIS — I455 Other specified heart block: Secondary | ICD-10-CM | POA: Diagnosis not present

## 2017-09-22 DIAGNOSIS — E119 Type 2 diabetes mellitus without complications: Secondary | ICD-10-CM | POA: Diagnosis not present

## 2017-09-22 DIAGNOSIS — E039 Hypothyroidism, unspecified: Secondary | ICD-10-CM | POA: Diagnosis not present

## 2017-09-22 DIAGNOSIS — I10 Essential (primary) hypertension: Secondary | ICD-10-CM | POA: Diagnosis not present

## 2017-09-23 DIAGNOSIS — I10 Essential (primary) hypertension: Secondary | ICD-10-CM | POA: Diagnosis not present

## 2017-09-23 DIAGNOSIS — E039 Hypothyroidism, unspecified: Secondary | ICD-10-CM | POA: Diagnosis not present

## 2017-09-23 DIAGNOSIS — I455 Other specified heart block: Secondary | ICD-10-CM | POA: Diagnosis not present

## 2017-09-23 DIAGNOSIS — I519 Heart disease, unspecified: Secondary | ICD-10-CM | POA: Diagnosis not present

## 2017-09-23 DIAGNOSIS — E785 Hyperlipidemia, unspecified: Secondary | ICD-10-CM | POA: Diagnosis not present

## 2017-09-23 DIAGNOSIS — E119 Type 2 diabetes mellitus without complications: Secondary | ICD-10-CM | POA: Diagnosis not present

## 2017-09-25 DIAGNOSIS — F0150 Vascular dementia without behavioral disturbance: Secondary | ICD-10-CM | POA: Diagnosis not present

## 2017-09-25 DIAGNOSIS — I10 Essential (primary) hypertension: Secondary | ICD-10-CM | POA: Diagnosis not present

## 2017-09-25 DIAGNOSIS — I455 Other specified heart block: Secondary | ICD-10-CM | POA: Diagnosis not present

## 2017-09-25 DIAGNOSIS — E039 Hypothyroidism, unspecified: Secondary | ICD-10-CM | POA: Diagnosis not present

## 2017-09-25 DIAGNOSIS — E119 Type 2 diabetes mellitus without complications: Secondary | ICD-10-CM | POA: Diagnosis not present

## 2017-09-25 DIAGNOSIS — I519 Heart disease, unspecified: Secondary | ICD-10-CM | POA: Diagnosis not present

## 2017-09-25 DIAGNOSIS — I509 Heart failure, unspecified: Secondary | ICD-10-CM | POA: Diagnosis not present

## 2017-09-25 DIAGNOSIS — N183 Chronic kidney disease, stage 3 (moderate): Secondary | ICD-10-CM | POA: Diagnosis not present

## 2017-09-25 DIAGNOSIS — R531 Weakness: Secondary | ICD-10-CM | POA: Diagnosis not present

## 2017-09-25 DIAGNOSIS — I4891 Unspecified atrial fibrillation: Secondary | ICD-10-CM | POA: Diagnosis not present

## 2017-09-25 DIAGNOSIS — E785 Hyperlipidemia, unspecified: Secondary | ICD-10-CM | POA: Diagnosis not present

## 2017-09-25 DIAGNOSIS — R0602 Shortness of breath: Secondary | ICD-10-CM | POA: Diagnosis not present

## 2017-09-28 DIAGNOSIS — I10 Essential (primary) hypertension: Secondary | ICD-10-CM | POA: Diagnosis not present

## 2017-09-28 DIAGNOSIS — I519 Heart disease, unspecified: Secondary | ICD-10-CM | POA: Diagnosis not present

## 2017-09-28 DIAGNOSIS — E119 Type 2 diabetes mellitus without complications: Secondary | ICD-10-CM | POA: Diagnosis not present

## 2017-09-28 DIAGNOSIS — E785 Hyperlipidemia, unspecified: Secondary | ICD-10-CM | POA: Diagnosis not present

## 2017-09-28 DIAGNOSIS — I455 Other specified heart block: Secondary | ICD-10-CM | POA: Diagnosis not present

## 2017-09-28 DIAGNOSIS — E039 Hypothyroidism, unspecified: Secondary | ICD-10-CM | POA: Diagnosis not present

## 2017-09-29 DIAGNOSIS — E119 Type 2 diabetes mellitus without complications: Secondary | ICD-10-CM | POA: Diagnosis not present

## 2017-09-29 DIAGNOSIS — I10 Essential (primary) hypertension: Secondary | ICD-10-CM | POA: Diagnosis not present

## 2017-09-29 DIAGNOSIS — I519 Heart disease, unspecified: Secondary | ICD-10-CM | POA: Diagnosis not present

## 2017-09-29 DIAGNOSIS — E039 Hypothyroidism, unspecified: Secondary | ICD-10-CM | POA: Diagnosis not present

## 2017-09-29 DIAGNOSIS — E785 Hyperlipidemia, unspecified: Secondary | ICD-10-CM | POA: Diagnosis not present

## 2017-09-29 DIAGNOSIS — I455 Other specified heart block: Secondary | ICD-10-CM | POA: Diagnosis not present

## 2017-09-30 DIAGNOSIS — I455 Other specified heart block: Secondary | ICD-10-CM | POA: Diagnosis not present

## 2017-09-30 DIAGNOSIS — I10 Essential (primary) hypertension: Secondary | ICD-10-CM | POA: Diagnosis not present

## 2017-09-30 DIAGNOSIS — E119 Type 2 diabetes mellitus without complications: Secondary | ICD-10-CM | POA: Diagnosis not present

## 2017-09-30 DIAGNOSIS — I519 Heart disease, unspecified: Secondary | ICD-10-CM | POA: Diagnosis not present

## 2017-09-30 DIAGNOSIS — E039 Hypothyroidism, unspecified: Secondary | ICD-10-CM | POA: Diagnosis not present

## 2017-09-30 DIAGNOSIS — E785 Hyperlipidemia, unspecified: Secondary | ICD-10-CM | POA: Diagnosis not present

## 2017-10-02 ENCOUNTER — Emergency Department (HOSPITAL_COMMUNITY): Payer: Medicare Other

## 2017-10-02 ENCOUNTER — Encounter (HOSPITAL_COMMUNITY): Payer: Self-pay

## 2017-10-02 ENCOUNTER — Inpatient Hospital Stay (HOSPITAL_COMMUNITY)
Admission: EM | Admit: 2017-10-02 | Discharge: 2017-10-06 | DRG: 536 | Disposition: A | Discharge status: Skilled Nursing Facility | Payer: Medicare Other | Attending: Internal Medicine | Admitting: Internal Medicine

## 2017-10-02 ENCOUNTER — Other Ambulatory Visit: Payer: Self-pay

## 2017-10-02 DIAGNOSIS — S329XXA Fracture of unspecified parts of lumbosacral spine and pelvis, initial encounter for closed fracture: Secondary | ICD-10-CM | POA: Diagnosis present

## 2017-10-02 DIAGNOSIS — E1122 Type 2 diabetes mellitus with diabetic chronic kidney disease: Secondary | ICD-10-CM | POA: Diagnosis not present

## 2017-10-02 DIAGNOSIS — Z823 Family history of stroke: Secondary | ICD-10-CM

## 2017-10-02 DIAGNOSIS — T148XXA Other injury of unspecified body region, initial encounter: Secondary | ICD-10-CM | POA: Diagnosis not present

## 2017-10-02 DIAGNOSIS — I69393 Ataxia following cerebral infarction: Secondary | ICD-10-CM | POA: Diagnosis not present

## 2017-10-02 DIAGNOSIS — S32511A Fracture of superior rim of right pubis, initial encounter for closed fracture: Secondary | ICD-10-CM | POA: Diagnosis not present

## 2017-10-02 DIAGNOSIS — K5520 Angiodysplasia of colon without hemorrhage: Secondary | ICD-10-CM | POA: Diagnosis present

## 2017-10-02 DIAGNOSIS — F39 Unspecified mood [affective] disorder: Secondary | ICD-10-CM | POA: Diagnosis not present

## 2017-10-02 DIAGNOSIS — Z885 Allergy status to narcotic agent status: Secondary | ICD-10-CM

## 2017-10-02 DIAGNOSIS — I480 Paroxysmal atrial fibrillation: Secondary | ICD-10-CM | POA: Diagnosis not present

## 2017-10-02 DIAGNOSIS — Z85828 Personal history of other malignant neoplasm of skin: Secondary | ICD-10-CM

## 2017-10-02 DIAGNOSIS — S3289XD Fracture of other parts of pelvis, subsequent encounter for fracture with routine healing: Secondary | ICD-10-CM | POA: Diagnosis not present

## 2017-10-02 DIAGNOSIS — Z803 Family history of malignant neoplasm of breast: Secondary | ICD-10-CM | POA: Diagnosis not present

## 2017-10-02 DIAGNOSIS — F0281 Dementia in other diseases classified elsewhere with behavioral disturbance: Secondary | ICD-10-CM | POA: Diagnosis not present

## 2017-10-02 DIAGNOSIS — R269 Unspecified abnormalities of gait and mobility: Secondary | ICD-10-CM

## 2017-10-02 DIAGNOSIS — S329XXD Fracture of unspecified parts of lumbosacral spine and pelvis, subsequent encounter for fracture with routine healing: Secondary | ICD-10-CM | POA: Diagnosis not present

## 2017-10-02 DIAGNOSIS — W1830XA Fall on same level, unspecified, initial encounter: Secondary | ICD-10-CM | POA: Diagnosis present

## 2017-10-02 DIAGNOSIS — I10 Essential (primary) hypertension: Secondary | ICD-10-CM | POA: Diagnosis not present

## 2017-10-02 DIAGNOSIS — Z79890 Hormone replacement therapy (postmenopausal): Secondary | ICD-10-CM

## 2017-10-02 DIAGNOSIS — Z9181 History of falling: Secondary | ICD-10-CM | POA: Diagnosis not present

## 2017-10-02 DIAGNOSIS — S32592A Other specified fracture of left pubis, initial encounter for closed fracture: Secondary | ICD-10-CM | POA: Diagnosis not present

## 2017-10-02 DIAGNOSIS — Z9841 Cataract extraction status, right eye: Secondary | ICD-10-CM

## 2017-10-02 DIAGNOSIS — Z961 Presence of intraocular lens: Secondary | ICD-10-CM | POA: Diagnosis present

## 2017-10-02 DIAGNOSIS — E785 Hyperlipidemia, unspecified: Secondary | ICD-10-CM | POA: Diagnosis not present

## 2017-10-02 DIAGNOSIS — Z9842 Cataract extraction status, left eye: Secondary | ICD-10-CM | POA: Diagnosis not present

## 2017-10-02 DIAGNOSIS — R293 Abnormal posture: Secondary | ICD-10-CM | POA: Diagnosis not present

## 2017-10-02 DIAGNOSIS — F0390 Unspecified dementia without behavioral disturbance: Secondary | ICD-10-CM | POA: Diagnosis present

## 2017-10-02 DIAGNOSIS — I4891 Unspecified atrial fibrillation: Secondary | ICD-10-CM | POA: Diagnosis present

## 2017-10-02 DIAGNOSIS — Z66 Do not resuscitate: Secondary | ICD-10-CM | POA: Diagnosis present

## 2017-10-02 DIAGNOSIS — S299XXA Unspecified injury of thorax, initial encounter: Secondary | ICD-10-CM | POA: Diagnosis not present

## 2017-10-02 DIAGNOSIS — R52 Pain, unspecified: Secondary | ICD-10-CM

## 2017-10-02 DIAGNOSIS — R296 Repeated falls: Secondary | ICD-10-CM | POA: Diagnosis present

## 2017-10-02 DIAGNOSIS — G4733 Obstructive sleep apnea (adult) (pediatric): Secondary | ICD-10-CM | POA: Diagnosis present

## 2017-10-02 DIAGNOSIS — M25552 Pain in left hip: Secondary | ICD-10-CM | POA: Diagnosis not present

## 2017-10-02 DIAGNOSIS — Z87891 Personal history of nicotine dependence: Secondary | ICD-10-CM | POA: Diagnosis not present

## 2017-10-02 DIAGNOSIS — S40811A Abrasion of right upper arm, initial encounter: Secondary | ICD-10-CM | POA: Diagnosis not present

## 2017-10-02 DIAGNOSIS — S32502A Unspecified fracture of left pubis, initial encounter for closed fracture: Secondary | ICD-10-CM | POA: Diagnosis not present

## 2017-10-02 DIAGNOSIS — N183 Chronic kidney disease, stage 3 unspecified: Secondary | ICD-10-CM | POA: Diagnosis present

## 2017-10-02 DIAGNOSIS — S79922A Unspecified injury of left thigh, initial encounter: Secondary | ICD-10-CM | POA: Diagnosis not present

## 2017-10-02 DIAGNOSIS — Z9981 Dependence on supplemental oxygen: Secondary | ICD-10-CM | POA: Diagnosis not present

## 2017-10-02 DIAGNOSIS — R0902 Hypoxemia: Secondary | ICD-10-CM | POA: Diagnosis present

## 2017-10-02 DIAGNOSIS — S59901A Unspecified injury of right elbow, initial encounter: Secondary | ICD-10-CM | POA: Diagnosis not present

## 2017-10-02 DIAGNOSIS — I629 Nontraumatic intracranial hemorrhage, unspecified: Secondary | ICD-10-CM

## 2017-10-02 DIAGNOSIS — R0602 Shortness of breath: Secondary | ICD-10-CM | POA: Diagnosis not present

## 2017-10-02 DIAGNOSIS — Z950 Presence of cardiac pacemaker: Secondary | ICD-10-CM | POA: Diagnosis not present

## 2017-10-02 DIAGNOSIS — E118 Type 2 diabetes mellitus with unspecified complications: Secondary | ICD-10-CM | POA: Diagnosis present

## 2017-10-02 DIAGNOSIS — R51 Headache: Secondary | ICD-10-CM | POA: Diagnosis not present

## 2017-10-02 DIAGNOSIS — R278 Other lack of coordination: Secondary | ICD-10-CM | POA: Diagnosis not present

## 2017-10-02 DIAGNOSIS — M6281 Muscle weakness (generalized): Secondary | ICD-10-CM | POA: Diagnosis not present

## 2017-10-02 DIAGNOSIS — S199XXA Unspecified injury of neck, initial encounter: Secondary | ICD-10-CM | POA: Diagnosis not present

## 2017-10-02 DIAGNOSIS — E119 Type 2 diabetes mellitus without complications: Secondary | ICD-10-CM | POA: Diagnosis not present

## 2017-10-02 DIAGNOSIS — Z87442 Personal history of urinary calculi: Secondary | ICD-10-CM | POA: Diagnosis not present

## 2017-10-02 DIAGNOSIS — I5032 Chronic diastolic (congestive) heart failure: Secondary | ICD-10-CM | POA: Diagnosis present

## 2017-10-02 DIAGNOSIS — E039 Hypothyroidism, unspecified: Secondary | ICD-10-CM | POA: Diagnosis not present

## 2017-10-02 DIAGNOSIS — I519 Heart disease, unspecified: Secondary | ICD-10-CM | POA: Diagnosis not present

## 2017-10-02 DIAGNOSIS — I69398 Other sequelae of cerebral infarction: Secondary | ICD-10-CM

## 2017-10-02 DIAGNOSIS — Z7401 Bed confinement status: Secondary | ICD-10-CM | POA: Diagnosis not present

## 2017-10-02 DIAGNOSIS — K219 Gastro-esophageal reflux disease without esophagitis: Secondary | ICD-10-CM | POA: Diagnosis present

## 2017-10-02 DIAGNOSIS — R279 Unspecified lack of coordination: Secondary | ICD-10-CM | POA: Diagnosis not present

## 2017-10-02 DIAGNOSIS — I455 Other specified heart block: Secondary | ICD-10-CM | POA: Diagnosis not present

## 2017-10-02 DIAGNOSIS — Z888 Allergy status to other drugs, medicaments and biological substances status: Secondary | ICD-10-CM

## 2017-10-02 DIAGNOSIS — I130 Hypertensive heart and chronic kidney disease with heart failure and stage 1 through stage 4 chronic kidney disease, or unspecified chronic kidney disease: Secondary | ICD-10-CM | POA: Diagnosis present

## 2017-10-02 DIAGNOSIS — S32301A Unspecified fracture of right ilium, initial encounter for closed fracture: Secondary | ICD-10-CM | POA: Diagnosis not present

## 2017-10-02 DIAGNOSIS — S32512A Fracture of superior rim of left pubis, initial encounter for closed fracture: Secondary | ICD-10-CM | POA: Diagnosis not present

## 2017-10-02 DIAGNOSIS — Z7982 Long term (current) use of aspirin: Secondary | ICD-10-CM

## 2017-10-02 DIAGNOSIS — W19XXXA Unspecified fall, initial encounter: Secondary | ICD-10-CM

## 2017-10-02 DIAGNOSIS — M542 Cervicalgia: Secondary | ICD-10-CM | POA: Diagnosis not present

## 2017-10-02 DIAGNOSIS — S0990XA Unspecified injury of head, initial encounter: Secondary | ICD-10-CM | POA: Diagnosis not present

## 2017-10-02 LAB — COMPREHENSIVE METABOLIC PANEL
ALBUMIN: 2.2 g/dL — AB (ref 3.5–5.0)
ALT: 14 U/L — ABNORMAL LOW (ref 17–63)
AST: 24 U/L (ref 15–41)
Alkaline Phosphatase: 80 U/L (ref 38–126)
Anion gap: 9 (ref 5–15)
BUN: 29 mg/dL — ABNORMAL HIGH (ref 6–20)
CALCIUM: 8.3 mg/dL — AB (ref 8.9–10.3)
CHLORIDE: 97 mmol/L — AB (ref 101–111)
CO2: 29 mmol/L (ref 22–32)
Creatinine, Ser: 1.49 mg/dL — ABNORMAL HIGH (ref 0.61–1.24)
GFR calc non Af Amer: 41 mL/min — ABNORMAL LOW (ref >60)
GFR, EST AFRICAN AMERICAN: 48 mL/min — AB (ref >60)
GLUCOSE: 149 mg/dL — AB (ref 65–99)
POTASSIUM: 4.8 mmol/L (ref 3.5–5.1)
SODIUM: 135 mmol/L (ref 135–145)
Total Bilirubin: 0.5 mg/dL (ref 0.3–1.2)
Total Protein: 6.4 g/dL — ABNORMAL LOW (ref 6.5–8.1)

## 2017-10-02 LAB — URINALYSIS, ROUTINE W REFLEX MICROSCOPIC
Bilirubin Urine: NEGATIVE
Glucose, UA: NEGATIVE mg/dL
Ketones, ur: NEGATIVE mg/dL
Nitrite: NEGATIVE
Protein, ur: 30 mg/dL — AB
SPECIFIC GRAVITY, URINE: 1.014 (ref 1.005–1.030)
pH: 5 (ref 5.0–8.0)

## 2017-10-02 LAB — GLUCOSE, CAPILLARY: Glucose-Capillary: 143 mg/dL — ABNORMAL HIGH (ref 65–99)

## 2017-10-02 LAB — TROPONIN I
TROPONIN I: 0.05 ng/mL — AB (ref <0.03)
Troponin I: 0.04 ng/mL (ref <0.03)

## 2017-10-02 LAB — CBC WITH DIFFERENTIAL/PLATELET
Basophils Absolute: 0 10*3/uL (ref 0.0–0.1)
Basophils Relative: 0 %
EOS ABS: 0.1 10*3/uL (ref 0.0–0.7)
Eosinophils Relative: 1 %
HEMATOCRIT: 26.8 % — AB (ref 39.0–52.0)
HEMOGLOBIN: 8 g/dL — AB (ref 13.0–17.0)
LYMPHS ABS: 1 10*3/uL (ref 0.7–4.0)
Lymphocytes Relative: 13 %
MCH: 29 pg (ref 26.0–34.0)
MCHC: 29.9 g/dL — ABNORMAL LOW (ref 30.0–36.0)
MCV: 97.1 fL (ref 78.0–100.0)
Monocytes Absolute: 0.7 10*3/uL (ref 0.1–1.0)
Monocytes Relative: 10 %
NEUTROS ABS: 5.8 10*3/uL (ref 1.7–7.7)
NEUTROS PCT: 76 %
Platelets: 144 10*3/uL — ABNORMAL LOW (ref 150–400)
RBC: 2.76 MIL/uL — AB (ref 4.22–5.81)
RDW: 19.8 % — ABNORMAL HIGH (ref 11.5–15.5)
WBC: 7.6 10*3/uL (ref 4.0–10.5)

## 2017-10-02 LAB — BRAIN NATRIURETIC PEPTIDE: B NATRIURETIC PEPTIDE 5: 789 pg/mL — AB (ref 0.0–100.0)

## 2017-10-02 LAB — LIPASE, BLOOD: Lipase: 36 U/L (ref 11–51)

## 2017-10-02 MED ORDER — SODIUM CHLORIDE 0.9 % IV SOLN: 250.0000 mL | INTRAVENOUS | Status: DC | PRN

## 2017-10-02 MED ORDER — METOPROLOL SUCCINATE ER 50 MG PO TB24
50.0000 mg | ORAL_TABLET | Freq: Every day | ORAL | Status: DC
  Administered 2017-10-02 – 2017-10-06 (×5): 50 mg via ORAL
  Filled 2017-10-02 (×5): qty 1

## 2017-10-02 MED ORDER — SODIUM CHLORIDE 0.9% FLUSH: 3.0000 mL | INTRAVENOUS | Status: DC | PRN

## 2017-10-02 MED ORDER — LEVOTHYROXINE SODIUM 50 MCG PO TABS
50.0000 ug | ORAL_TABLET | Freq: Every day | ORAL | Status: DC
  Administered 2017-10-03 – 2017-10-06 (×4): 50 ug via ORAL
  Filled 2017-10-02 (×4): qty 1

## 2017-10-02 MED ORDER — ACETAMINOPHEN 325 MG PO TABS: 650.0000 mg | ORAL_TABLET | Freq: Four times a day (QID) | ORAL | Status: DC | PRN

## 2017-10-02 MED ORDER — ACETAMINOPHEN 500 MG PO TABS
500.0000 mg | ORAL_TABLET | Freq: Two times a day (BID) | ORAL | Status: DC
  Administered 2017-10-02 – 2017-10-06 (×8): 500 mg via ORAL
  Filled 2017-10-02 (×8): qty 1

## 2017-10-02 MED ORDER — INSULIN ASPART 100 UNIT/ML ~~LOC~~ SOLN
0.0000 [IU] | Freq: Three times a day (TID) | SUBCUTANEOUS | Status: DC
  Administered 2017-10-03: 1 [IU] via SUBCUTANEOUS
  Administered 2017-10-03: 2 [IU] via SUBCUTANEOUS
  Administered 2017-10-04 – 2017-10-05 (×4): 1 [IU] via SUBCUTANEOUS

## 2017-10-02 MED ORDER — POTASSIUM CHLORIDE CRYS ER 20 MEQ PO TBCR
20.0000 meq | EXTENDED_RELEASE_TABLET | Freq: Every day | ORAL | Status: DC
  Administered 2017-10-02 – 2017-10-03 (×2): 20 meq via ORAL
  Filled 2017-10-02 (×6): qty 2

## 2017-10-02 MED ORDER — ACETAMINOPHEN 650 MG RE SUPP: 650.0000 mg | Freq: Four times a day (QID) | RECTAL | Status: DC | PRN

## 2017-10-02 MED ORDER — DULOXETINE HCL 60 MG PO CPEP
60.0000 mg | ORAL_CAPSULE | Freq: Every evening | ORAL | Status: DC
  Administered 2017-10-02 – 2017-10-05 (×4): 60 mg via ORAL
  Filled 2017-10-02 (×4): qty 1

## 2017-10-02 MED ORDER — FERROUS SULFATE 325 (65 FE) MG PO TABS
325.0000 mg | ORAL_TABLET | Freq: Two times a day (BID) | ORAL | Status: DC
  Administered 2017-10-03 – 2017-10-06 (×7): 325 mg via ORAL
  Filled 2017-10-02 (×7): qty 1

## 2017-10-02 MED ORDER — ORAL CARE MOUTH RINSE
15.0000 mL | Freq: Two times a day (BID) | OROMUCOSAL | Status: DC
  Administered 2017-10-02 – 2017-10-05 (×7): 15 mL via OROMUCOSAL

## 2017-10-02 MED ORDER — DIAZEPAM 5 MG PO TABS
2.5000 mg | ORAL_TABLET | Freq: Every day | ORAL | Status: DC
  Administered 2017-10-02 – 2017-10-06 (×5): 2.5 mg via ORAL
  Filled 2017-10-02 (×6): qty 1

## 2017-10-02 MED ORDER — FUROSEMIDE 40 MG PO TABS
40.0000 mg | ORAL_TABLET | Freq: Two times a day (BID) | ORAL | Status: DC
  Administered 2017-10-03 – 2017-10-06 (×7): 40 mg via ORAL
  Filled 2017-10-02 (×7): qty 1

## 2017-10-02 MED ORDER — OXYCODONE HCL 5 MG PO TABS
5.0000 mg | ORAL_TABLET | ORAL | Status: DC | PRN
  Administered 2017-10-05: 5 mg via ORAL
  Filled 2017-10-02 (×2): qty 1

## 2017-10-02 MED ORDER — PANTOPRAZOLE SODIUM 40 MG PO TBEC
40.0000 mg | DELAYED_RELEASE_TABLET | Freq: Every day | ORAL | Status: DC
  Administered 2017-10-03 – 2017-10-06 (×4): 40 mg via ORAL
  Filled 2017-10-02 (×4): qty 1

## 2017-10-02 MED ORDER — SODIUM CHLORIDE 0.9% FLUSH
3.0000 mL | Freq: Two times a day (BID) | INTRAVENOUS | Status: DC
  Administered 2017-10-02 – 2017-10-05 (×7): 3 mL via INTRAVENOUS

## 2017-10-02 MED ORDER — DIVALPROEX SODIUM 250 MG PO DR TAB
250.0000 mg | DELAYED_RELEASE_TABLET | Freq: Two times a day (BID) | ORAL | Status: DC
  Administered 2017-10-02 – 2017-10-06 (×8): 250 mg via ORAL
  Filled 2017-10-02 (×8): qty 1

## 2017-10-02 MED ORDER — ONDANSETRON HCL 4 MG/2ML IJ SOLN: 4.0000 mg | Freq: Four times a day (QID) | INTRAMUSCULAR | Status: DC | PRN

## 2017-10-02 MED ORDER — ASPIRIN EC 81 MG PO TBEC
81.0000 mg | DELAYED_RELEASE_TABLET | Freq: Every day | ORAL | Status: DC
  Administered 2017-10-02 – 2017-10-06 (×5): 81 mg via ORAL
  Filled 2017-10-02 (×5): qty 1

## 2017-10-02 MED ORDER — AMIODARONE HCL 200 MG PO TABS
200.0000 mg | ORAL_TABLET | ORAL | Status: DC
  Administered 2017-10-02 – 2017-10-05 (×2): 200 mg via ORAL
  Filled 2017-10-02 (×4): qty 1

## 2017-10-02 MED ORDER — ONDANSETRON HCL 4 MG PO TABS: 4.0000 mg | ORAL_TABLET | Freq: Four times a day (QID) | ORAL | Status: DC | PRN

## 2017-10-02 NOTE — ED Notes (Signed)
Wife reports skin tear to right upper arm from fall tuesday

## 2017-10-02 NOTE — ED Provider Notes (Signed)
Emergency Department Provider Note   I have reviewed the triage vital signs and the nursing notes.   HISTORY  Chief Complaint Hip Pain   HPI Richard [name] is a 84 y.o. male with PMH of dementia, complete heart block with pacemaker, CVA, CHF, HLD, HTN, and DM presents to the emergency department for evaluation of left hip pain after fall early this morning.  Patient's wife is at bedside who provides most of the history.  He states he is fallen several times over the last week.  Last night he was found on the ground after family heard a loud noise.  Patient was moved back to his recliner by his son where he stayed for several hours.  This morning he was complaining of pain in the left hip and was not ambulatory.  Wife states that he was recently admitted multiple times for anemia, congestive heart failure, and once in June 2018 with fall/head trauma resulting in hematoma formation. No longer anticoagulated. Patient does not recall the events surrounding the fall.   Level 5 caveat: Dementia    Past Medical History:  Diagnosis Date  . Anxiety   . Arthritis   . Atrial fibrillation ([place])    a. Dx 03/2013, notes report atrial fibrillation/atrial flutter, placed on amiodarone. NSR in subsequent OV's.  . B12 deficiency anemia   . Cancer ([place])   . Chest pain    a. H/o CTA negative for PE 2012, normal cath 2005, normal nucs previously including 05/2012.  Marland Kitchen Complete heart block (HCC)    a. s/p Medtronic Adapta L model ADDRL 1 (serial number [id number]) pacemaker.  . Dementia   . GERD (gastroesophageal reflux disease)   . Hiatal hernia   . Hyperlipidemia   . Hypertension   . Iron deficiency anemia   . LBBB (left bundle branch block)   . Microcytic anemia   . Nephrolithiasis   . On home oxygen therapy    a. 2L w/CPAP at night  . Orthostatic hypotension   . OSA on CPAP    setting = 4  . Pacemaker   . [place] spotted fever ~ 1945  . Sinus drainage   . Stroke ([place]) 05/2016   . Type II diabetes mellitus [facility])     Patient Active Problem List   Diagnosis Date Noted  . Pelvic fracture ([place]) 10/02/2017  . Acute systolic congestive heart failure ([place])   . Encounter for hospice care discussion   . Palliative care encounter   . Goals of care, counseling/discussion   . Lobar pneumonia ([place]) 08/30/2017  . Pulmonary edema 08/28/2017  . Acute on chronic combined systolic and diastolic CHF (congestive heart failure) ([place]) 08/28/2017  . Acute diastolic CHF (congestive heart failure) ([place]) 05/22/2017  . CKD (chronic kidney disease) stage 3, GFR 30-59 ml/min (HCC) 05/22/2017  . Acute respiratory failure with hypoxia ([place]) 05/22/2017  . Intracranial hemorrhage ([place]) 03/03/2017  . Head trauma 03/02/2017  . Chronic diastolic CHF (congestive heart failure) ([place]) 11/16/2016  . AVM (arteriovenous malformation) of small bowel, acquired ([place])   . Abnormality of gait as late effect of stroke 05/27/2016  . Benign neoplasm of cecum   . Benign neoplasm of sigmoid colon   . GI bleeding 05/22/2016  . Melena   . GIB (gastrointestinal bleeding) 05/21/2016  . Diabetes mellitus with complication ([place])   . Upper GI bleed   . CKD (chronic kidney disease)   . Type 2 diabetes mellitus with circulatory disorder, without long-term current  use of insulin ([place])   . Acute right PCA stroke ([place]) 05/08/2016  . Ataxia due to recent stroke   . Gait disturbance, post-stroke   . Cerebellar stroke, acute ([place]) 05/07/2016  . Hyperlipidemia   . Chronic anticoagulation   . Cardiac pacemaker in situ   . Acute blood loss anemia   . Cognitive deficits   . Cognitive deficit due to recent stroke   . Stroke-related cognitive dysfunction   . Generalized weakness 05/05/2016  . Dizziness   . Absolute anemia 02/08/2016  . Weakness 02/01/2016  . Gastric polyp 06/21/2015  . Iron deficiency anemia due to chronic blood loss   . GERD (gastroesophageal reflux disease)   . B12 deficiency anemia   .  Bradycardia with less than 30 beats per minute 05/31/2014  . Complete heart block ([place]) 05/31/2014  . Syncope 05/31/2014  . Anemia 05/31/2014  . Atrial fibrillation ([place]) 04/08/2013  . Diabetes mellitus ([place]) 04/06/2013  . Hiatal hernia 04/06/2013  . Essential hypertension 06/13/2011  . OSA (obstructive sleep apnea) 06/13/2011    Past Surgical History:  Procedure Laterality Date  . CARDIAC CATHETERIZATION     by Dr. Acie Fredrickson, January 24, 2004, that shows minimal coronary artery irregularities and normal left ventricular function  . CATARACT EXTRACTION W/ INTRAOCULAR LENS  IMPLANT, BILATERAL Bilateral   . COLONOSCOPY WITH PROPOFOL N/A 05/23/2016   Procedure: COLONOSCOPY WITH PROPOFOL;  Surgeon: Jerene Bears, MD;  Location: [place];  Service: Gastroenterology;  Laterality: N/A;  . ENTEROSCOPY N/A 06/02/2016   Procedure: ENTEROSCOPY;  Surgeon: Gatha Mayer, MD;  Location: [facility] ENDOSCOPY;  Service: Endoscopy;  Laterality: N/A;  . ESOPHAGOGASTRODUODENOSCOPY N/A 05/22/2016   Procedure: ESOPHAGOGASTRODUODENOSCOPY (EGD);  Surgeon: Jerene Bears, MD;  Location: [facility] ENDOSCOPY;  Service: Endoscopy;  Laterality: N/A;  . ESOPHAGOGASTRODUODENOSCOPY (EGD) WITH PROPOFOL N/A 06/21/2015   Procedure: ESOPHAGOGASTRODUODENOSCOPY (EGD) WITH PROPOFOL;  Surgeon: Gatha Mayer, MD;  Location: [facility] ENDOSCOPY;  Service: Endoscopy;  Laterality: N/A;  . GIVENS CAPSULE STUDY N/A 05/23/2016   Procedure: GIVENS CAPSULE STUDY;  Surgeon: Jerene Bears, MD;  Location: [place];  Service: Gastroenterology;  Laterality: N/A;  . HOT HEMOSTASIS N/A 06/02/2016   Procedure: HOT HEMOSTASIS (ARGON PLASMA COAGULATION/BICAP);  Surgeon: Gatha Mayer, MD;  Location: [facility];  Service: Endoscopy;  Laterality: N/A;  . INGUINAL HERNIA REPAIR Right   . IR RADIOLOGIST EVAL & MGMT  11/25/2016  . IR RADIOLOGIST EVAL & MGMT  01/22/2017  . IR VERTEBROPLASTY CERV/THOR BX INC UNI/BIL INC/INJECT/IMAGING  01/30/2017  . IR VERTEBROPLASTY EA ADDL  (T&LS) BX INC UNI/BIL INC INJECT/IMAGING  11/28/2016  . IR VERTEBROPLASTY EA ADDL (T&LS) BX INC UNI/BIL INC INJECT/IMAGING  01/30/2017  . IR VERTEBROPLASTY LUMBAR BX INC UNI/BIL INC/INJECT/IMAGING  11/28/2016  . LUMBAR DISC SURGERY  ~ 1993  . PERMANENT PACEMAKER INSERTION N/A 06/03/2014   MDT Adapta L implanted by Dr Rayann Heman for syncope and transient AV block  . SKIN CANCER EXCISION     "lower lip" (04/06/2013)  . TEMPORARY PACEMAKER INSERTION N/A 05/31/2014   Procedure: TEMPORARY WIRE;  Surgeon: Sinclair Grooms, MD;  Location: [facility] CATH LAB;  Service: Cardiovascular;  Laterality: N/A;  . VASECTOMY     Hx of     Current Outpatient Rx  . Order #: [id number] Class: Normal  . Order #: [id number] Class: Print  . Order #: [id number] Class: Historical Med  . Order #: [id number] Class: Historical Med  . Order #: [id number] Class: Normal  . Order #: [id number] Class: Historical Med  .  Order #: [id number] Class: Normal  . Order #: [id number] Class: Print  . Order #: [id number] Class: No Print  . Order #: [id number] Class: Historical Med  . Order #: [id number] Class: Historical Med  . Order #: [id number] Class: Normal  . Order #: [id number] Class: Print  . Order #: [id number] Class: Historical Med  . Order #: [id number] Class: Historical Med    Allergies Codeine and Citalopram  Family History  Problem Relation Age of Onset  . Thyroid disease Mother        goiter  . Pneumonia Father   . Other Unknown        Parents both died of old age; other conditions not known  . Stroke Brother   . Breast cancer Sister     Social History Social History   Tobacco Use  . Smoking status: Former Smoker    Packs/day: 0.00    Years: 20.00    Pack years: 0.00    Types: Cigarettes  . Smokeless tobacco: Never Used  . Tobacco comment: 04/06/2013 "quit smoking years ago; didn't smoke that much; probably 20 years; 1ppd"  Substance Use Topics  . Alcohol use: No  . Drug use: No    Review of Systems  Level 5 caveat: Dementia.    ____________________________________________   PHYSICAL EXAM:  VITAL SIGNS: ED Triage Vitals [10/02/17 0949]  Enc Vitals Group     BP (!) 151/84     Pulse Rate 95     Resp (!) 23     Temp 97.8 F (36.6 C)     Temp Source Oral     SpO2 90 %    Constitutional: Alert but confused.  Eyes: Conjunctivae are normal. PERRL Head: Atraumatic. Nose: No congestion/rhinnorhea. Mouth/Throat: Mucous membranes are moist.  Neck: No stridor. Cardiovascular: Normal rate, regular rhythm. Good peripheral circulation. Grossly normal heart sounds.   Respiratory: Increased respiratory effort.  No retractions. Lungs with some bilateral crackles on exam.  Gastrointestinal: Soft and nontender. No distention.  Musculoskeletal: Pain with passive ROM of the left hip and knee. Normal ROM without pain int he right leg. Neurologic:  Normal speech and language. No gross focal neurologic deficits are appreciated.  Skin:  Skin is warm and dry. Right upper extremity skin tare with mild surrounding erythema.   ____________________________________________   LABS (all labs ordered are listed, but only abnormal results are displayed)  Labs Reviewed  COMPREHENSIVE METABOLIC PANEL - Abnormal; Notable for the following components:      Result Value   Chloride 97 (*)    Glucose, Bld 149 (*)    BUN 29 (*)    Creatinine, Ser 1.49 (*)    Calcium 8.3 (*)    Total Protein 6.4 (*)    Albumin 2.2 (*)    ALT 14 (*)    GFR calc non Af Amer 41 (*)    GFR calc Af Amer 48 (*)    All other components within normal limits  CBC WITH DIFFERENTIAL/PLATELET - Abnormal; Notable for the following components:   RBC 2.76 (*)    Hemoglobin 8.0 (*)    HCT 26.8 (*)    MCHC 29.9 (*)    RDW 19.8 (*)    Platelets 144 (*)    All other components within normal limits  BRAIN NATRIURETIC PEPTIDE - Abnormal; Notable for the following components:   B Natriuretic Peptide 789.0 (*)    All other components within normal limits   TROPONIN I - Abnormal; Notable for the following components:   Troponin I 0.05 (*)  All other components within normal limits  LIPASE, BLOOD  URINALYSIS, ROUTINE W REFLEX MICROSCOPIC   ____________________________________________  EKG   EKG Interpretation  Date/Time:  Friday October 02 2017 09:48:58 EST Ventricular Rate:  96 PR Interval:    QRS Duration: 164 QT Interval:  406 QTC Calculation: 514 R Axis:   -27 Text Interpretation:  Atrial-sensed ventricular-paced complexes No further analysis attempted due to paced rhythm Similar to prior.  Confirmed by Nanda Quinton [phone]) on 10/02/2017 10:23:17 AM       ____________________________________________  RADIOLOGY  Dg Chest 1 View  Result Date: 10/02/2017 CLINICAL DATA:  fell. Landed on bottom. Son assisted him to recliner and wife reports he has complained of left hip pain/pain right elbow/a fib/htn/diabetic EXAM: CHEST 1 VIEW COMPARISON:  08/30/2017 FINDINGS: LEFT-sided pacemaker overlies stable enlarged cardiac silhouette. There is mild persistent airspace disease in the RIGHT upper lobe improved from comparison exam. Mild airspace disease in the RIGHT lower lobe. No pneumothorax. No fracture. IMPRESSION: 1. Extend this no evidence of thoracic trauma. 2. Much improved RIGHT upper lobe pneumonia. 3. Persistent but improved bilateral pulmonary edema and interstitial edema. Electronically Signed   By: Suzy Bouchard M.D.   On: 10/02/2017 10:57   Dg Pelvis 1-2 Views  Result Date: 10/02/2017 CLINICAL DATA:  Fall, left hip pain. EXAM: PELVIS - 1-2 VIEW COMPARISON:  None. FINDINGS: Fractures are noted through the lateral left inferior pubic ramus and medial superior pubic ramus. The superior pubic ramus fracture extends into the pubic body. No proximal femoral abnormality. Degenerative changes in the hips bilaterally. IMPRESSION: Lateral inferior and medial superior pubic rami fractures on the left. No proximal femoral abnormality.  Electronically Signed   By: Rolm Baptise M.D.   On: 10/02/2017 10:56   Dg Elbow 2 Views Right  Result Date: 10/02/2017 CLINICAL DATA:  fell. Landed on bottom. Son assisted him to recliner and wife reports he has complained of left hip pain/pain right elbow/a fib/htn/diabetic EXAM: RIGHT ELBOW - 2 VIEW COMPARISON:  None. FINDINGS: No fracture.  No bone lesion. The elbow joint is normally spaced and aligned.  No joint effusion. Soft tissues are unremarkable. IMPRESSION: Negative. Electronically Signed   By: Lajean Manes M.D.   On: 10/02/2017 10:56   Ct Head Wo Contrast  Result Date: 10/02/2017 CLINICAL DATA:  Recent fall with headaches and neck pain, initial encounter EXAM: CT HEAD WITHOUT CONTRAST CT CERVICAL SPINE WITHOUT CONTRAST TECHNIQUE: Multidetector CT imaging of the head and cervical spine was performed following the standard protocol without intravenous contrast. Multiplanar CT image reconstructions of the cervical spine were also generated. COMPARISON:  03/17/2017 FINDINGS: CT HEAD FINDINGS Brain: Atrophic and chronic white matter ischemic changes are noted and stable. Previously seen right temporal hemorrhage has resolved in the interval. No significant sequelae are identified. No findings to suggest acute hemorrhage, acute infarction or space-occupying mass lesion is seen. Vascular: No hyperdense vessel or unexpected calcification. Skull: Normal. Negative for fracture or focal lesion. Sinuses/Orbits: No acute finding. Other: None. CT CERVICAL SPINE FINDINGS Alignment: Within normal limits Skull base and vertebrae: 7 cervical segments are well visualized. Vertebral body height is well maintained. Osteophytic and facet hypertrophic changes are noted. No acute fracture or acute facet abnormality is seen. Soft tissues and spinal canal: No soft tissue abnormality is noted. Upper chest: Small left pleural effusion is noted in the apex. Other: None IMPRESSION: CT of the head: Chronic changes without acute  abnormality. Previously seen right temporal hemorrhage has resolved in the  interval. CT of the cervical spine: Degenerative change without acute abnormality. Electronically Signed   By: Inez Catalina M.D.   On: 10/02/2017 11:20   Ct Cervical Spine Wo Contrast  Result Date: 10/02/2017 CLINICAL DATA:  Recent fall with headaches and neck pain, initial encounter EXAM: CT HEAD WITHOUT CONTRAST CT CERVICAL SPINE WITHOUT CONTRAST TECHNIQUE: Multidetector CT imaging of the head and cervical spine was performed following the standard protocol without intravenous contrast. Multiplanar CT image reconstructions of the cervical spine were also generated. COMPARISON:  03/17/2017 FINDINGS: CT HEAD FINDINGS Brain: Atrophic and chronic white matter ischemic changes are noted and stable. Previously seen right temporal hemorrhage has resolved in the interval. No significant sequelae are identified. No findings to suggest acute hemorrhage, acute infarction or space-occupying mass lesion is seen. Vascular: No hyperdense vessel or unexpected calcification. Skull: Normal. Negative for fracture or focal lesion. Sinuses/Orbits: No acute finding. Other: None. CT CERVICAL SPINE FINDINGS Alignment: Within normal limits Skull base and vertebrae: 7 cervical segments are well visualized. Vertebral body height is well maintained. Osteophytic and facet hypertrophic changes are noted. No acute fracture or acute facet abnormality is seen. Soft tissues and spinal canal: No soft tissue abnormality is noted. Upper chest: Small left pleural effusion is noted in the apex. Other: None IMPRESSION: CT of the head: Chronic changes without acute abnormality. Previously seen right temporal hemorrhage has resolved in the interval. CT of the cervical spine: Degenerative change without acute abnormality. Electronically Signed   By: Inez Catalina M.D.   On: 10/02/2017 11:20   Dg Femur Min 2 Views Left  Result Date: 10/02/2017 CLINICAL DATA:  fell. Landed on  bottom. Son assisted him to recliner and wife reports he has complained of left hip pain/pain right elbow/a fib/htn/diabetic EXAM: LEFT FEMUR 2 VIEWS COMPARISON:  None. FINDINGS: There are fractures of the superior inferior pubic rami on the left. No left femur fracture.  No bone lesion. The hip and knee joints are normally aligned. There are scattered femoral or artery vascular calcifications. Soft tissues are otherwise unremarkable. IMPRESSION: 1. Fractures of the left superior inferior pubic rami. 2. No left femur fracture.  No dislocation. Electronically Signed   By: Lajean Manes M.D.   On: 10/02/2017 10:56    ____________________________________________   PROCEDURES  Procedure(s) performed:   Procedures  None ____________________________________________   INITIAL IMPRESSION / ASSESSMENT AND PLAN / ED COURSE  Pertinent labs & imaging results that were available during my care of the patient were reviewed by me and considered in my medical decision making (see chart for details).  Patient presents to the emergency department for evaluation after fall this morning with left hip pain and unable to bear weight on the leg.  He has pain with passive range of motion of the left hip.  Pulses and sensation are intact.  History is limited by the patient's underlying dementia.  He has had several recent falls.  Also with recent history of intracerebral hemorrhage after falls in June 2018.  Plan for CT imaging of the head and cervical spine.  Patient on slightly increased oxygen requirement here with some notable increased work of breathing and crackles.  Plan to obtain chest x-ray in addition to plain film of the left hip and knee.  Skin tear noted above is overall well-appearing and happen several days ago.  Plan to clean and dress this area.   11:45 AM Called to evaluate the patient for hypoxemia.  He has no increased work of breathing.  His mental status is unchanged.  He has nasal cannula in  place of 5 L but is breathing primarily through his mouth.  When I asked him to breathe only through his nose his oxygen returns to 98%.  12:22 PM Spoke with Dr. Aline Brochure regarding the patient.  He will consult on the patient but asked that the primary team paged him when the patient is on the floor so he can provide official consult.  No surgical intervention for the injuries described.   Discussed patient's case with Hospitalist, Dr. Jerilee Hoh to request admission. Patient and family (if present) updated with plan. Care transferred to Hospitalist service.  I reviewed all nursing notes, vitals, pertinent old records, EKGs, labs, imaging (as available).  ____________________________________________  FINAL CLINICAL IMPRESSION(S) / ED DIAGNOSES  Final diagnoses:  Closed fracture of ramus of left pubis, initial encounter ([place])  Fall, initial encounter  Hypoxemia  Abrasion of right upper extremity, initial encounter    Note:  This document was prepared using Dragon voice recognition software and may include unintentional dictation errors.  Nanda Quinton, MD Emergency Medicine   Long, [facility], MD 10/02/17 [phone]

## 2017-10-02 NOTE — ED Notes (Signed)
CRITICAL VALUE ALERT  Critical Value:  Trop 0.05  Date & Time Notied:  10/02/17, 1103  Provider Notified: Dr. Laverta Baltimore  Orders Received/Actions taken: No new orders

## 2017-10-02 NOTE — ED Notes (Signed)
Patient o2 saturation 87% at 5L 02 nasal cannula.  MD made aware.

## 2017-10-02 NOTE — H&P (Signed)
History and Physical    PHINNEAS SHAKOOR YSA:[id number] DOB: February 04, 1931 DOA: 10/02/2017  PCP: Prince Solian, MD  Patient coming from: Home  Chief Complaint: Fall  HPI: DEMETRIS CAPELL is a 84 y.o. male with medical history significant of frequent falls, A. fib not on anticoagulation due to frequent falls, subdural hematoma in the summer 2018 from a fall, congestive heart failure, dementia, comes in after falling last night injuring himself.  Patient brought to the emergency department today as family was concerned because he was unable to ambulate like he normally does.  Patient appears to have very mild dementia.  He can answer questions reasonably.  His daughter is in wife are with him right now.  He is not had any recent illnesses.  He wakes up frequently in the night to go to urinate and this is when he usually does fall.  He normally uses a walker but he does not use the walker at night.  He got up last night to go to the bathroom and fell.  Patient found to have a pelvic fracture and referred for admission for evaluation for further treatment.  Orthopedic surgery has been called who has deemed this nonoperative.  Patient reports no pain right now as he is lying he does not really know if he can get up and walk and whether or not that will cause an extreme amount of pain.  Review of Systems: As per HPI otherwise 10 point review of systems negative.   Past Medical History:  Diagnosis Date  . Anxiety   . Arthritis   . Atrial fibrillation ([place])    a. Dx 03/2013, notes report atrial fibrillation/atrial flutter, placed on amiodarone. NSR in subsequent OV's.  . B12 deficiency anemia   . Cancer ([place])   . Chest pain    a. H/o CTA negative for PE 2012, normal cath 2005, normal nucs previously including 05/2012.  Marland Kitchen Complete heart block (HCC)    a. s/p Medtronic Adapta L model ADDRL 1 (serial number [id number]) pacemaker.  . Dementia   . GERD (gastroesophageal reflux disease)   . Hiatal hernia    . Hyperlipidemia   . Hypertension   . Iron deficiency anemia   . LBBB (left bundle branch block)   . Microcytic anemia   . Nephrolithiasis   . On home oxygen therapy    a. 2L w/CPAP at night  . Orthostatic hypotension   . OSA on CPAP    setting = 4  . Pacemaker   . [place] spotted fever ~ 1945  . Sinus drainage   . Stroke ([place]) 05/2016  . Type II diabetes mellitus ([place])     Past Surgical History:  Procedure Laterality Date  . CARDIAC CATHETERIZATION     by Dr. Acie Fredrickson, January 24, 2004, that shows minimal coronary artery irregularities and normal left ventricular function  . CATARACT EXTRACTION W/ INTRAOCULAR LENS  IMPLANT, BILATERAL Bilateral   . COLONOSCOPY WITH PROPOFOL N/A 05/23/2016   Procedure: COLONOSCOPY WITH PROPOFOL;  Surgeon: Jerene Bears, MD;  Location: [place];  Service: Gastroenterology;  Laterality: N/A;  . ENTEROSCOPY N/A 06/02/2016   Procedure: ENTEROSCOPY;  Surgeon: Gatha Mayer, MD;  Location: [facility];  Service: Endoscopy;  Laterality: N/A;  . ESOPHAGOGASTRODUODENOSCOPY N/A 05/22/2016   Procedure: ESOPHAGOGASTRODUODENOSCOPY (EGD);  Surgeon: Jerene Bears, MD;  Location: [facility] ENDOSCOPY;  Service: Endoscopy;  Laterality: N/A;  . ESOPHAGOGASTRODUODENOSCOPY (EGD) WITH PROPOFOL N/A 06/21/2015   Procedure: ESOPHAGOGASTRODUODENOSCOPY (EGD) WITH PROPOFOL;  Surgeon: Gatha Mayer, MD;  Location: [facility];  Service: Endoscopy;  Laterality: N/A;  . GIVENS CAPSULE STUDY N/A 05/23/2016   Procedure: GIVENS CAPSULE STUDY;  Surgeon: Jerene Bears, MD;  Location: [place];  Service: Gastroenterology;  Laterality: N/A;  . HOT HEMOSTASIS N/A 06/02/2016   Procedure: HOT HEMOSTASIS (ARGON PLASMA COAGULATION/BICAP);  Surgeon: Gatha Mayer, MD;  Location: [facility];  Service: Endoscopy;  Laterality: N/A;  . INGUINAL HERNIA REPAIR Right   . IR RADIOLOGIST EVAL & MGMT  11/25/2016  . IR RADIOLOGIST EVAL & MGMT  01/22/2017  . IR VERTEBROPLASTY CERV/THOR BX INC UNI/BIL  INC/INJECT/IMAGING  01/30/2017  . IR VERTEBROPLASTY EA ADDL (T&LS) BX INC UNI/BIL INC INJECT/IMAGING  11/28/2016  . IR VERTEBROPLASTY EA ADDL (T&LS) BX INC UNI/BIL INC INJECT/IMAGING  01/30/2017  . IR VERTEBROPLASTY LUMBAR BX INC UNI/BIL INC/INJECT/IMAGING  11/28/2016  . LUMBAR DISC SURGERY  ~ 1993  . PERMANENT PACEMAKER INSERTION N/A 06/03/2014   MDT Adapta L implanted by Dr Rayann Heman for syncope and transient AV block  . SKIN CANCER EXCISION     "lower lip" (04/06/2013)  . TEMPORARY PACEMAKER INSERTION N/A 05/31/2014   Procedure: TEMPORARY WIRE;  Surgeon: Sinclair Grooms, MD;  Location: [facility] CATH LAB;  Service: Cardiovascular;  Laterality: N/A;  . VASECTOMY     Hx of      reports that he has quit smoking. His smoking use included cigarettes. He smoked 0.00 packs per day for 20.00 years. he has never used smokeless tobacco. He reports that he does not drink alcohol or use drugs.  Allergies  Allergen Reactions  . Codeine Other (See Comments)    Hallucinations   . Citalopram Swelling and Rash    Family History  Problem Relation Age of Onset  . Thyroid disease Mother        goiter  . Pneumonia Father   . Other Unknown        Parents both died of old age; other conditions not known  . Stroke Brother   . Breast cancer Sister     Prior to Admission medications   Medication Sig Start Date End Date Taking? Authorizing Provider  acetaminophen (TYLENOL) 500 MG tablet Take 500 mg by mouth 2 (two) times daily.    Yes [provider]  amiodarone (PACERONE) 200 MG tablet Take 200 mg by mouth every Monday, Wednesday, and Friday.    Yes [provider]  aspirin EC 81 MG tablet Take 1 tablet (81 mg total) by mouth daily. 05/26/17  Yes Tat, Shanon Brow, MD  diazepam (VALIUM) 5 MG tablet Take 0.5 tablets (2.5 mg total) by mouth at bedtime. 05/25/17  Yes Tat, Shanon Brow, MD  divalproex (DEPAKOTE) 250 MG DR tablet Take 1 tablet by mouth 2 (two) times daily. 05/13/17  Yes [provider]    DULoxetine (CYMBALTA) 60 MG capsule Take 60 mg by mouth every evening.   Yes [provider]  ferrous sulfate 325 (65 FE) MG tablet Take 325 mg by mouth 2 (two) times daily with a meal.    Yes [provider]  fluticasone (FLONASE) 50 MCG/ACT nasal spray Place 1 spray into the nose daily as needed for allergies or rhinitis.    Yes [provider]  furosemide (LASIX) 40 MG tablet Take 1 tablet (40 mg total) by mouth 2 (two) times daily. 09/02/17  Yes Tat, Shanon Brow, MD  levothyroxine (SYNTHROID, LEVOTHROID) 50 MCG tablet Take 50 mcg by mouth daily before breakfast.  Yes [provider]  metoprolol succinate (TOPROL-XL) 50 MG 24 hr tablet Take 1 tablet (50 mg total) by mouth daily. Take with or immediately following a meal. 09/03/17  Yes Tat, Shanon Brow, MD  pantoprazole (PROTONIX) 40 MG tablet Take 1 tablet (40 mg total) by mouth daily at 6 (six) AM. 05/25/16  Yes Kelvin Cellar, MD  potassium chloride (K-DUR) 10 MEQ tablet Take 2 tablets (20 mEq total) by mouth daily. 07/30/17 10/28/17 Yes Nahser, Wonda Cheng, MD  amoxicillin-clavulanate (AUGMENTIN) 875-125 MG tablet Take 1 tablet by mouth every 12 (twelve) hours. Patient not taking: Reported on 10/02/2017 09/02/17   Orson Eva, MD    Physical Exam: Vitals:   10/02/17 1430 10/02/17 1530 10/02/17 1542 10/02/17 1600  BP: (!) 179/91 (!) 168/93 (!) 168/93 (!) 155/94  Pulse: 97 (!) 102 99 99  Resp: (!) 23 (!) 22 20 (!) 28  Temp:      TempSrc:      SpO2: 95% (!) 87% 96% 97%      Constitutional: NAD, calm, comfortable Vitals:   10/02/17 1430 10/02/17 1530 10/02/17 1542 10/02/17 1600  BP: (!) 179/91 (!) 168/93 (!) 168/93 (!) 155/94  Pulse: 97 (!) 102 99 99  Resp: (!) 23 (!) 22 20 (!) 28  Temp:      TempSrc:      SpO2: 95% (!) 87% 96% 97%   Eyes: PERRL, lids and conjunctivae normal ENMT: Mucous membranes are moist. Posterior pharynx clear of any exudate or lesions.Normal dentition.  Neck: normal, supple, no masses, no  thyromegaly Respiratory: clear to auscultation bilaterally, no wheezing, no crackles. Normal respiratory effort. No accessory muscle use.  Cardiovascular: Regular rate and rhythm, no murmurs / rubs / gallops. No extremity edema. 2+ pedal pulses. No carotid bruits.  Abdomen: no tenderness, no masses palpated. No hepatosplenomegaly. Bowel sounds positive.  Musculoskeletal: no clubbing / cyanosis. No joint deformity upper and lower extremities. Good ROM, no contractures. Normal muscle tone.  Skin: no rashes, lesions, ulcers. No induration Neurologic: CN 2-12 grossly intact. Sensation intact, DTR normal. Strength 5/5 in all 4.  Psychiatric: Normal judgment and insight. Alert and oriented x 2 not to time. Normal mood.    Labs on Admission: I have personally reviewed following labs and imaging studies  CBC: Recent Labs  Lab 10/02/17 1017  WBC 7.6  NEUTROABS 5.8  HGB 8.0*  HCT 26.8*  MCV 97.1  PLT 324*   Basic Metabolic Panel: Recent Labs  Lab 10/02/17 1017  NA 135  K 4.8  CL 97*  CO2 29  GLUCOSE 149*  BUN 29*  CREATININE 1.49*  CALCIUM 8.3*   GFR: CrCl cannot be calculated (Unknown ideal weight.). Liver Function Tests: Recent Labs  Lab 10/02/17 1017  AST 24  ALT 14*  ALKPHOS 80  BILITOT 0.5  PROT 6.4*  ALBUMIN 2.2*   Recent Labs  Lab 10/02/17 1017  LIPASE 36   No results for input(s): AMMONIA in the last 168 hours. Coagulation Profile: No results for input(s): INR, PROTIME in the last 168 hours. Cardiac Enzymes: Recent Labs  Lab 10/02/17 1017  TROPONINI 0.05*   BNP (last 3 results) Recent Labs    07/21/17 1235 08/21/17 1026  PROBNP 13,733* 8,038*   HbA1C: No results for input(s): HGBA1C in the last 72 hours. CBG: No results for input(s): GLUCAP in the last 168 hours. Lipid Profile: No results for input(s): CHOL, HDL, LDLCALC, TRIG, CHOLHDL, LDLDIRECT in the last 72 hours. Thyroid Function Tests: No results for  input(s): TSH, T4TOTAL, FREET4,  T3FREE, THYROIDAB in the last 72 hours. Anemia Panel: No results for input(s): VITAMINB12, FOLATE, FERRITIN, TIBC, IRON, RETICCTPCT in the last 72 hours. Urine analysis:    Component Value Date/Time   COLORURINE YELLOW 10/02/2017 1005   APPEARANCEUR CLOUDY (A) 10/02/2017 1005   LABSPEC 1.014 10/02/2017 1005   PHURINE 5.0 10/02/2017 1005   GLUCOSEU NEGATIVE 10/02/2017 1005   HGBUR LARGE (A) 10/02/2017 1005   BILIRUBINUR NEGATIVE 10/02/2017 1005   KETONESUR NEGATIVE 10/02/2017 1005   PROTEINUR 30 (A) 10/02/2017 1005   UROBILINOGEN 0.2 03/30/2014 1400   NITRITE NEGATIVE 10/02/2017 1005   LEUKOCYTESUR LARGE (A) 10/02/2017 1005   Sepsis Labs: !!!!!!!!!!!!!!!!!!!!!!!!!!!!!!!!!!!!!!!!!!!! @LABRCNTIP (procalcitonin:4,lacticidven:4) )No results found for this or any previous visit (from the past 240 hour(s)).   Radiological Exams on Admission: Dg Chest 1 View  Result Date: 10/02/2017 CLINICAL DATA:  fell. Landed on bottom. Son assisted him to recliner and wife reports he has complained of left hip pain/pain right elbow/a fib/htn/diabetic EXAM: CHEST 1 VIEW COMPARISON:  08/30/2017 FINDINGS: LEFT-sided pacemaker overlies stable enlarged cardiac silhouette. There is mild persistent airspace disease in the RIGHT upper lobe improved from comparison exam. Mild airspace disease in the RIGHT lower lobe. No pneumothorax. No fracture. IMPRESSION: 1. Extend this no evidence of thoracic trauma. 2. Much improved RIGHT upper lobe pneumonia. 3. Persistent but improved bilateral pulmonary edema and interstitial edema. Electronically Signed   By: Suzy Bouchard M.D.   On: 10/02/2017 10:57   Dg Pelvis 1-2 Views  Result Date: 10/02/2017 CLINICAL DATA:  Fall, left hip pain. EXAM: PELVIS - 1-2 VIEW COMPARISON:  None. FINDINGS: Fractures are noted through the lateral left inferior pubic ramus and medial superior pubic ramus. The superior pubic ramus fracture extends into the pubic body. No proximal femoral  abnormality. Degenerative changes in the hips bilaterally. IMPRESSION: Lateral inferior and medial superior pubic rami fractures on the left. No proximal femoral abnormality. Electronically Signed   By: Rolm Baptise M.D.   On: 10/02/2017 10:56   Dg Elbow 2 Views Right  Result Date: 10/02/2017 CLINICAL DATA:  fell. Landed on bottom. Son assisted him to recliner and wife reports he has complained of left hip pain/pain right elbow/a fib/htn/diabetic EXAM: RIGHT ELBOW - 2 VIEW COMPARISON:  None. FINDINGS: No fracture.  No bone lesion. The elbow joint is normally spaced and aligned.  No joint effusion. Soft tissues are unremarkable. IMPRESSION: Negative. Electronically Signed   By: Lajean Manes M.D.   On: 10/02/2017 10:56   Ct Head Wo Contrast  Result Date: 10/02/2017 CLINICAL DATA:  Recent fall with headaches and neck pain, initial encounter EXAM: CT HEAD WITHOUT CONTRAST CT CERVICAL SPINE WITHOUT CONTRAST TECHNIQUE: Multidetector CT imaging of the head and cervical spine was performed following the standard protocol without intravenous contrast. Multiplanar CT image reconstructions of the cervical spine were also generated. COMPARISON:  03/17/2017 FINDINGS: CT HEAD FINDINGS Brain: Atrophic and chronic white matter ischemic changes are noted and stable. Previously seen right temporal hemorrhage has resolved in the interval. No significant sequelae are identified. No findings to suggest acute hemorrhage, acute infarction or space-occupying mass lesion is seen. Vascular: No hyperdense vessel or unexpected calcification. Skull: Normal. Negative for fracture or focal lesion. Sinuses/Orbits: No acute finding. Other: None. CT CERVICAL SPINE FINDINGS Alignment: Within normal limits Skull base and vertebrae: 7 cervical segments are well visualized. Vertebral body height is well maintained. Osteophytic and facet hypertrophic changes are noted. No acute fracture or acute facet abnormality is  seen. Soft tissues and spinal  canal: No soft tissue abnormality is noted. Upper chest: Small left pleural effusion is noted in the apex. Other: None IMPRESSION: CT of the head: Chronic changes without acute abnormality. Previously seen right temporal hemorrhage has resolved in the interval. CT of the cervical spine: Degenerative change without acute abnormality. Electronically Signed   By: Inez Catalina M.D.   On: 10/02/2017 11:20   Ct Cervical Spine Wo Contrast  Result Date: 10/02/2017 CLINICAL DATA:  Recent fall with headaches and neck pain, initial encounter EXAM: CT HEAD WITHOUT CONTRAST CT CERVICAL SPINE WITHOUT CONTRAST TECHNIQUE: Multidetector CT imaging of the head and cervical spine was performed following the standard protocol without intravenous contrast. Multiplanar CT image reconstructions of the cervical spine were also generated. COMPARISON:  03/17/2017 FINDINGS: CT HEAD FINDINGS Brain: Atrophic and chronic white matter ischemic changes are noted and stable. Previously seen right temporal hemorrhage has resolved in the interval. No significant sequelae are identified. No findings to suggest acute hemorrhage, acute infarction or space-occupying mass lesion is seen. Vascular: No hyperdense vessel or unexpected calcification. Skull: Normal. Negative for fracture or focal lesion. Sinuses/Orbits: No acute finding. Other: None. CT CERVICAL SPINE FINDINGS Alignment: Within normal limits Skull base and vertebrae: 7 cervical segments are well visualized. Vertebral body height is well maintained. Osteophytic and facet hypertrophic changes are noted. No acute fracture or acute facet abnormality is seen. Soft tissues and spinal canal: No soft tissue abnormality is noted. Upper chest: Small left pleural effusion is noted in the apex. Other: None IMPRESSION: CT of the head: Chronic changes without acute abnormality. Previously seen right temporal hemorrhage has resolved in the interval. CT of the cervical spine: Degenerative change without  acute abnormality. Electronically Signed   By: Inez Catalina M.D.   On: 10/02/2017 11:20   Dg Femur Min 2 Views Left  Result Date: 10/02/2017 CLINICAL DATA:  fell. Landed on bottom. Son assisted him to recliner and wife reports he has complained of left hip pain/pain right elbow/a fib/htn/diabetic EXAM: LEFT FEMUR 2 VIEWS COMPARISON:  None. FINDINGS: There are fractures of the superior inferior pubic rami on the left. No left femur fracture.  No bone lesion. The hip and knee joints are normally aligned. There are scattered femoral or artery vascular calcifications. Soft tissues are otherwise unremarkable. IMPRESSION: 1. Fractures of the left superior inferior pubic rami. 2. No left femur fracture.  No dislocation. Electronically Signed   By: Lajean Manes M.D.   On: 10/02/2017 10:56    Old chart reviewed   Assessment/Plan 84 year old male with a mechanical fall found to have a inferior pubic ramus fracture  Principal Problem:   Pelvic fracture (HCC)-orthopedic surgery Dr. Aline Brochure consulted.  Will obtain physical therapy evaluation tomorrow.  We will start him on Tylenol and oxycodone for moderate pain.  See how he does with therapy.  He may or may not need inpatient rehab.  Admit to telemetry.  Active Problems:   Atrial fibrillation (HCC)-currently rate controlled not anticoagulated secondary to frequent falls and recent subdural bleed last summer    Cardiac pacemaker in situ-noted    Ataxia due to recent stroke-physical therapy evaluation    Diabetes mellitus with complication (HCC)-patient does not appear to be on anything for his diabetes per pharmacy list    AVM (arteriovenous malformation) of small bowel, acquired (HCC)-stable at this time without any evidence of bleeding    Chronic diastolic CHF (congestive heart failure) (HCC)-stable and compensated at this time continue home  Lasix dosing    Intracranial hemorrhage (HCC) summer 2018-noted    CKD (chronic kidney disease) stage 3,  GFR 30-59 ml/min (HCC)-currently at his baseline status    Frequent falls-obtain PT eval      DVT prophylaxis: SCDs Code Status: DNR from last hospitalization in January, I did not confirm this with family Family Communication: Daughter and wife Disposition Plan: Per day team Consults called: Orthopedic surgery and physical therapy Admission status: Admission   Geraldyne Barraclough A MD [facility]  If 7PM-7AM, please contact night-coverage [email or web address] Password [facility]  10/02/2017, 6:16 PM

## 2017-10-02 NOTE — ED Notes (Signed)
EDP at bedside  

## 2017-10-02 NOTE — ED Notes (Signed)
Pt given dinner meal tray

## 2017-10-02 NOTE — ED Notes (Signed)
Pt transported to xray 

## 2017-10-02 NOTE — ED Triage Notes (Signed)
Pt brought in by EMS. Pt got up at 3 am and fell. Landed on bottom. Son assisted him to recliner and wife reports he has complained of left hip pain

## 2017-10-02 NOTE — ED Notes (Signed)
Pt noted to have bruises covering bilateral arms and right hip

## 2017-10-03 DIAGNOSIS — S32592A Other specified fracture of left pubis, initial encounter for closed fracture: Principal | ICD-10-CM

## 2017-10-03 DIAGNOSIS — S32512A Fracture of superior rim of left pubis, initial encounter for closed fracture: Secondary | ICD-10-CM

## 2017-10-03 DIAGNOSIS — S32301A Unspecified fracture of right ilium, initial encounter for closed fracture: Secondary | ICD-10-CM

## 2017-10-03 LAB — GLUCOSE, CAPILLARY
GLUCOSE-CAPILLARY: 130 mg/dL — AB (ref 65–99)
GLUCOSE-CAPILLARY: 101 mg/dL — AB (ref 65–99)
Glucose-Capillary: 113 mg/dL — ABNORMAL HIGH (ref 65–99)
Glucose-Capillary: 196 mg/dL — ABNORMAL HIGH (ref 65–99)

## 2017-10-03 LAB — BASIC METABOLIC PANEL
Anion gap: 12 (ref 5–15)
BUN: 32 mg/dL — AB (ref 6–20)
CO2: 27 mmol/L (ref 22–32)
CREATININE: 1.5 mg/dL — AB (ref 0.61–1.24)
Calcium: 8.4 mg/dL — ABNORMAL LOW (ref 8.9–10.3)
Chloride: 99 mmol/L — ABNORMAL LOW (ref 101–111)
GFR, EST AFRICAN AMERICAN: 47 mL/min — AB (ref >60)
GFR, EST NON AFRICAN AMERICAN: 41 mL/min — AB (ref >60)
Glucose, Bld: 137 mg/dL — ABNORMAL HIGH (ref 65–99)
POTASSIUM: 5.2 mmol/L — AB (ref 3.5–5.1)
SODIUM: 138 mmol/L (ref 135–145)

## 2017-10-03 LAB — CBC
HCT: 24.8 % — ABNORMAL LOW (ref 39.0–52.0)
Hemoglobin: 7.3 g/dL — ABNORMAL LOW (ref 13.0–17.0)
MCH: 28.5 pg (ref 26.0–34.0)
MCHC: 29.4 g/dL — ABNORMAL LOW (ref 30.0–36.0)
MCV: 96.9 fL (ref 78.0–100.0)
PLATELETS: 156 10*3/uL (ref 150–400)
RBC: 2.56 MIL/uL — AB (ref 4.22–5.81)
RDW: 19.9 % — AB (ref 11.5–15.5)
WBC: 8.3 10*3/uL (ref 4.0–10.5)

## 2017-10-03 NOTE — Evaluation (Signed)
Physical Therapy Evaluation Patient Details Name: Richard [name] MRN: [id number] DOB: 1931-02-11 Today's Date: 10/03/2017   History of Present Illness   Richard [name] is a 84 y.o. male with medical history significant of frequent falls, A. fib not on anticoagulation due to frequent falls, subdural hematoma in the summer 2018 from a fall, congestive heart failure, dementia, comes in after falling last night injuring himself.  Patient brought to the emergency department today as family was concerned because he was unable to ambulate like he normally does.  Patient appears to have very mild dementia.  He can answer questions reasonably.  His daughter is in wife are with him right now.  He is not had any recent illnesses.  He wakes up frequently in the night to go to urinate and this is when he usually does fall.  He normally uses a walker but he does not use the walker at night.  He got up last night to go to the bathroom and fell.  Patient found to have a pelvic fracture and referred for admission for evaluation for further treatment.  Orthopedic surgery has been called who has deemed this nonoperative.  Patient reports no pain right now as he is lying he does not really know if he can get up and walk and whether or not that will cause an extreme amount of pain.  Clinical Impression  [name] wife states that he is normally able to ambulate with a rolling walker for short distances.  He normally gets around in a wheelchair but is able to transfer into the wheelchair on his own.  Currently [name] needs max assist for bed mobility and is unable to come sit to stand or transfer.  He will benefit from skilled physical therapy to improve his functioning ability and safety.  He will need SNF at discharge for both his and his wife's safety.      Follow Up Recommendations SNF    Equipment Recommendations  None recommended by PT    Recommendations for Other Services   OT     Precautions /  Restrictions Precautions Precautions: Fall Restrictions Weight Bearing Restrictions: Yes LLE Weight Bearing: Weight bearing as tolerated Other Position/Activity Restrictions: Lt LE      Mobility  Bed Mobility Overal bed mobility: Needs Assistance Bed Mobility: Rolling;Supine to Sit;Sit to Supine Rolling: Max assist   Supine to sit: Max assist Sit to supine: Max assist   General bed mobility comments: Pt kept leaning back and would not sit on the side of the bed stating, "I can't do this"  Transfers                    Ambulation/Gait                        Pertinent Vitals/Pain Pain Assessment: Faces Faces Pain Scale: Hurts worst Pain Location: Lt leg with motion  Pain Intervention(s): Limited activity within patient's tolerance    Home Living Family/patient expects to be discharged to:: Private residence Living Arrangements: Spouse/significant other Available Help at Discharge: Family Type of Home: House Home Access: Stairs to enter Entrance Stairs-Rails: Right Entrance Stairs-Number of Steps: 4 Home Layout: One level Home Equipment: Wheelchair - [employer] - 2 wheels      Prior Function Level of Independence: Needs assistance   Gait / Transfers Assistance Needed: Wife states pt able to usw a RW for short distances but mainly uses the wheelchair since his  stroke   ADL's / Homemaking Assistance Needed: wife completes           Extremity/Trunk Assessment        Lower Extremity Assessment Lower Extremity Assessment: Difficult to assess due to impaired cognition       Communication   Communication: No difficulties  Cognition Arousal/Alertness: Lethargic Behavior During Therapy: WFL for tasks assessed/performed Overall Cognitive Status: Within Functional Limits for tasks assessed                                           Exercises General Exercises - Lower Extremity Ankle Circles/Pumps: AROM;Both;10 reps Heel  Slides: Both;5 reps Straight Leg Raises: Right;5 reps   Assessment/Plan    PT Assessment Patient needs continued PT services  PT Problem List Decreased strength;Decreased activity tolerance;Decreased balance;Decreased mobility;Decreased safety awareness       PT Treatment Interventions Gait training;Therapeutic activities;Therapeutic exercise;Balance training    PT Goals (Current goals can be found in the Care Plan section)  Acute Rehab PT Goals PT Goal Formulation: With patient/family Time For Goal Achievement: 10/06/17 Potential to Achieve Goals: Good    Frequency Min 6X/week   Barriers to discharge        Co-evaluation                  End of Session   Activity Tolerance: Patient limited by pain Patient left: in bed;with bed alarm set;with family/visitor present   PT Visit Diagnosis: Muscle weakness (generalized) (M62.81);History of falling (Z91.81);Difficulty in walking, not elsewhere classified (R26.2)    Time: 6967-8938 PT Time Calculation (min) (ACUTE ONLY): 31 min   Charges:   PT Evaluation $PT Eval Low Complexity: [place], PT CLT [phone] 10/03/2017, 3:03 PM

## 2017-10-03 NOTE — Consult Note (Addendum)
Reason for Consult:Left pelvic fracture Referring Physician: Dr. Danella Penton is an 84 y.o. male.  HPI: 84 year old male frequent falls multiple medical problems as listed below got up confused one evening fell and injured his left hip.  Date of injury is questionable but we will say it was February 8.  Complains of pain along the left groin and pelvis which is severe nonradiating sharp with movement associated with inability to weight-bear  Past Medical History:  Diagnosis Date  . Anxiety   . Arthritis   . Atrial fibrillation ([place])    a. Dx 03/2013, notes report atrial fibrillation/atrial flutter, placed on amiodarone. NSR in subsequent OV's.  . B12 deficiency anemia   . Cancer ([place])   . Chest pain    a. H/o CTA negative for PE 2012, normal cath 2005, normal nucs previously including 05/2012.  Marland Kitchen Complete heart block (H[facility])    a. s/p Medtronic Adapta L model ADDRL 1 (serial number [id number]) pacemaker.  . Dementia   . GERD (gastroesophageal reflux disease)   . Hiatal hernia   . Hyperlipidemia   . Hypertension   . Iron deficiency anemia   . LBBB (left bundle branch block)   . Microcytic anemia   . Nephrolithiasis   . On home oxygen therapy    a. 2L w/CPAP at night  . Orthostatic hypotension   . OSA on CPAP    setting = 4  . Pacemaker   . [place] spotted fever ~ 1945  . Sinus drainage   . Stroke ([place]) 05/2016  . Type II diabetes mellitus ([place])     Past Surgical History:  Procedure Laterality Date  . CARDIAC CATHETERIZATION     by Dr. Acie Fredrickson, January 24, 2004, that shows minimal coronary artery irregularities and normal left ventricular function  . CATARACT EXTRACTION W/ INTRAOCULAR LENS  IMPLANT, BILATERAL Bilateral   . COLONOSCOPY WITH PROPOFOL N/A 05/23/2016   Procedure: COLONOSCOPY WITH PROPOFOL;  Surgeon: Jerene Bears, MD;  Location: [place];  Service: Gastroenterology;  Laterality: N/A;  . ENTEROSCOPY N/A 06/02/2016   Procedure:  ENTEROSCOPY;  Surgeon: Gatha Mayer, MD;  Location: [facility];  Service: Endoscopy;  Laterality: N/A;  . ESOPHAGOGASTRODUODENOSCOPY N/A 05/22/2016   Procedure: ESOPHAGOGASTRODUODENOSCOPY (EGD);  Surgeon: Jerene Bears, MD;  Location: [facility] ENDOSCOPY;  Service: Endoscopy;  Laterality: N/A;  . ESOPHAGOGASTRODUODENOSCOPY (EGD) WITH PROPOFOL N/A 06/21/2015   Procedure: ESOPHAGOGASTRODUODENOSCOPY (EGD) WITH PROPOFOL;  Surgeon: Gatha Mayer, MD;  Location: [facility];  Service: Endoscopy;  Laterality: N/A;  . GIVENS CAPSULE STUDY N/A 05/23/2016   Procedure: GIVENS CAPSULE STUDY;  Surgeon: Jerene Bears, MD;  Location: [place];  Service: Gastroenterology;  Laterality: N/A;  . HOT HEMOSTASIS N/A 06/02/2016   Procedure: HOT HEMOSTASIS (ARGON PLASMA COAGULATION/BICAP);  Surgeon: Gatha Mayer, MD;  Location: [facility];  Service: Endoscopy;  Laterality: N/A;  . INGUINAL HERNIA REPAIR Right   . IR RADIOLOGIST EVAL & MGMT  11/25/2016  . IR RADIOLOGIST EVAL & MGMT  01/22/2017  . IR VERTEBROPLASTY CERV/THOR BX INC UNI/BIL INC/INJECT/IMAGING  01/30/2017  . IR VERTEBROPLASTY EA ADDL (T&LS) BX INC UNI/BIL INC INJECT/IMAGING  11/28/2016  . IR VERTEBROPLASTY EA ADDL (T&LS) BX INC UNI/BIL INC INJECT/IMAGING  01/30/2017  . IR VERTEBROPLASTY LUMBAR BX INC UNI/BIL INC/INJECT/IMAGING  11/28/2016  . LUMBAR DISC SURGERY  ~ 1993  . PERMANENT PACEMAKER INSERTION N/A 06/03/2014   MDT Adapta L implanted by Dr Rayann Heman for syncope and transient  AV block  . SKIN CANCER EXCISION     "lower lip" (04/06/2013)  . TEMPORARY PACEMAKER INSERTION N/A 05/31/2014   Procedure: TEMPORARY WIRE;  Surgeon: Sinclair Grooms, MD;  Location: [facility] CATH LAB;  Service: Cardiovascular;  Laterality: N/A;  . VASECTOMY     Hx of     Family History  Problem Relation Age of Onset  . Thyroid disease Mother        goiter  . Pneumonia Father   . Other Unknown        Parents both died of old age; other conditions not known  . Stroke Brother   .  Breast cancer Sister     Social History:  reports that he has quit smoking. His smoking use included cigarettes. He smoked 0.00 packs per day for 20.00 years. he has never used smokeless tobacco. He reports that he does not drink alcohol or use drugs.  Allergies:  Allergies  Allergen Reactions  . Codeine Other (See Comments)    Hallucinations   . Citalopram Swelling and Rash    Medications: I have reviewed the patient's current medications.  Results for orders placed or performed during the hospital encounter of 10/02/17 (from the past 48 hour(s))  Urinalysis, Routine w reflex microscopic     Status: Abnormal   Collection Time: 10/02/17 10:05 AM  Result Value Ref Range   Color, Urine YELLOW YELLOW   APPearance CLOUDY (A) CLEAR   Specific Gravity, Urine 1.014 1.005 - 1.030   pH 5.0 5.0 - 8.0   Glucose, UA NEGATIVE NEGATIVE mg/dL   Hgb urine dipstick LARGE (A) NEGATIVE   Bilirubin Urine NEGATIVE NEGATIVE   Ketones, ur NEGATIVE NEGATIVE mg/dL   Protein, ur 30 (A) NEGATIVE mg/dL   Nitrite NEGATIVE NEGATIVE   Leukocytes, UA LARGE (A) NEGATIVE   RBC / HPF 6-30 0 - 5 RBC/hpf   WBC, UA TOO NUMEROUS TO COUNT 0 - 5 WBC/hpf   Bacteria, UA RARE (A) NONE SEEN   Squamous Epithelial / LPF 0-5 (A) NONE SEEN   WBC Clumps PRESENT    Mucus PRESENT     Comment: Performed at [facility], [address], [place], [place] [address]  Comprehensive metabolic panel     Status: Abnormal   Collection Time: 10/02/17 10:17 AM  Result Value Ref Range   Sodium 135 135 - 145 mmol/L   Potassium 4.8 3.5 - 5.1 mmol/L   Chloride 97 (L) 101 - 111 mmol/L   CO2 29 22 - 32 mmol/L   Glucose, Bld 149 (H) 65 - 99 mg/dL   BUN 29 (H) 6 - 20 mg/dL   Creatinine, Ser 1.49 (H) 0.61 - 1.24 mg/dL   Calcium 8.3 (L) 8.9 - 10.3 mg/dL   Total Protein 6.4 (L) 6.5 - 8.1 g/dL   Albumin 2.2 (L) 3.5 - 5.0 g/dL   AST 24 15 - 41 U/L   ALT 14 (L) 17 - 63 U/L   Alkaline Phosphatase 80 38 - 126 U/L   Total Bilirubin 0.5 0.3 -  1.2 mg/dL   GFR calc non Af Amer 41 (L) >60 mL/min   GFR calc Af Amer 48 (L) >60 mL/min    Comment: (NOTE) The eGFR has been calculated using the CKD EPI equation. This calculation has not been validated in all clinical situations. eGFR's persistently <60 mL/min signify possible Chronic Kidney Disease.    Anion gap 9 5 - 15    Comment: Performed at [facility], [address] [place], [place] [address]  Lipase, blood     Status: None   Collection Time: 10/02/17 10:17 AM  Result Value Ref Range   Lipase 36 11 - 51 U/L    Comment: Performed at [facility], [address] [place], [place] [address]  CBC with Differential     Status: Abnormal   Collection Time: 10/02/17 10:17 AM  Result Value Ref Range   WBC 7.6 4.0 - 10.5 K/uL   RBC 2.76 (L) 4.22 - 5.81 MIL/uL   Hemoglobin 8.0 (L) 13.0 - 17.0 g/dL   HCT 26.8 (L) 39.0 - 52.0 %   MCV 97.1 78.0 - 100.0 fL   MCH 29.0 26.0 - 34.0 pg   MCHC 29.9 (L) 30.0 - 36.0 g/dL   RDW 19.8 (H) 11.5 - 15.5 %   Platelets 144 (L) 150 - 400 K/uL   Neutrophils Relative % 76 %   Neutro Abs 5.8 1.7 - 7.7 K/uL   Lymphocytes Relative 13 %   Lymphs Abs 1.0 0.7 - 4.0 K/uL   Monocytes Relative 10 %   Monocytes Absolute 0.7 0.1 - 1.0 K/uL   Eosinophils Relative 1 %   Eosinophils Absolute 0.1 0.0 - 0.7 K/uL   Basophils Relative 0 %   Basophils Absolute 0.0 0.0 - 0.1 K/uL    Comment: Performed at [facility], [address] [place], [place] [address]  Brain natriuretic peptide     Status: Abnormal   Collection Time: 10/02/17 10:17 AM  Result Value Ref Range   B Natriuretic Peptide 789.0 (H) 0.0 - 100.0 pg/mL    Comment: Performed at [facility], [address] [place], [place] [address]  Troponin I     Status: Abnormal   Collection Time: 10/02/17 10:17 AM  Result Value Ref Range   Troponin I 0.05 (HH) <0.03 ng/mL    Comment: CRITICAL RESULT CALLED TO, READ BACK BY AND VERIFIED WITH: MINTER,R AT 11:00AM ON 10/02/17 BY [facility] Performed at [facility], [address], [place], [place] [address]   Troponin I     Status: Abnormal   Collection Time: 10/02/17  6:04 PM  Result Value Ref Range   Troponin I 0.04 (HH) <0.03 ng/mL    Comment: CRITICAL VALUE NOTED.  VALUE IS CONSISTENT WITH PREVIOUSLY REPORTED AND CALLED VALUE. Performed at [facility], [address] [place], [place] [address]   Glucose, capillary     Status: Abnormal   Collection Time: 10/02/17 10:04 PM  Result Value Ref Range   Glucose-Capillary 143 (H) 65 - 99 mg/dL   Comment 1 Notify RN    Comment 2 Document in Chart     Dg Chest 1 View  Result Date: 10/02/2017 CLINICAL DATA:  fell. Landed on bottom. Son assisted him to recliner and wife reports he has complained of left hip pain/pain right elbow/a fib/htn/diabetic EXAM: CHEST 1 VIEW COMPARISON:  08/30/2017 FINDINGS: LEFT-sided pacemaker overlies stable enlarged cardiac silhouette. There is mild persistent airspace disease in the RIGHT upper lobe improved from comparison exam. Mild airspace disease in the RIGHT lower lobe. No pneumothorax. No fracture. IMPRESSION: 1. Extend this no evidence of thoracic trauma. 2. Much improved RIGHT upper lobe pneumonia. 3. Persistent but improved bilateral pulmonary edema and interstitial edema. Electronically Signed   By: Suzy Bouchard M.D.   On: 10/02/2017 10:57   Dg Pelvis 1-2 Views  Result Date: 10/02/2017 CLINICAL DATA:  Fall, left hip pain. EXAM: PELVIS - 1-2 VIEW COMPARISON:  None. FINDINGS: Fractures  are noted through the lateral left inferior pubic ramus and medial superior pubic ramus. The superior pubic ramus fracture extends into the pubic body. No proximal femoral abnormality. Degenerative changes in the hips bilaterally. IMPRESSION: Lateral inferior and medial superior pubic rami fractures on the left. No proximal femoral abnormality. Electronically Signed   By: Rolm Baptise M.D.   On: 10/02/2017 10:56   Dg Elbow 2 Views Right  Result Date: 10/02/2017 CLINICAL DATA:   fell. Landed on bottom. Son assisted him to recliner and wife reports he has complained of left hip pain/pain right elbow/a fib/htn/diabetic EXAM: RIGHT ELBOW - 2 VIEW COMPARISON:  None. FINDINGS: No fracture.  No bone lesion. The elbow joint is normally spaced and aligned.  No joint effusion. Soft tissues are unremarkable. IMPRESSION: Negative. Electronically Signed   By: Lajean Manes M.D.   On: 10/02/2017 10:56   Ct Head Wo Contrast  Result Date: 10/02/2017 CLINICAL DATA:  Recent fall with headaches and neck pain, initial encounter EXAM: CT HEAD WITHOUT CONTRAST CT CERVICAL SPINE WITHOUT CONTRAST TECHNIQUE: Multidetector CT imaging of the head and cervical spine was performed following the standard protocol without intravenous contrast. Multiplanar CT image reconstructions of the cervical spine were also generated. COMPARISON:  03/17/2017 FINDINGS: CT HEAD FINDINGS Brain: Atrophic and chronic white matter ischemic changes are noted and stable. Previously seen right temporal hemorrhage has resolved in the interval. No significant sequelae are identified. No findings to suggest acute hemorrhage, acute infarction or space-occupying mass lesion is seen. Vascular: No hyperdense vessel or unexpected calcification. Skull: Normal. Negative for fracture or focal lesion. Sinuses/Orbits: No acute finding. Other: None. CT CERVICAL SPINE FINDINGS Alignment: Within normal limits Skull base and vertebrae: 7 cervical segments are well visualized. Vertebral body height is well maintained. Osteophytic and facet hypertrophic changes are noted. No acute fracture or acute facet abnormality is seen. Soft tissues and spinal canal: No soft tissue abnormality is noted. Upper chest: Small left pleural effusion is noted in the apex. Other: None IMPRESSION: CT of the head: Chronic changes without acute abnormality. Previously seen right temporal hemorrhage has resolved in the interval. CT of the cervical spine: Degenerative change  without acute abnormality. Electronically Signed   By: Inez Catalina M.D.   On: 10/02/2017 11:20   Ct Cervical Spine Wo Contrast  Result Date: 10/02/2017 CLINICAL DATA:  Recent fall with headaches and neck pain, initial encounter EXAM: CT HEAD WITHOUT CONTRAST CT CERVICAL SPINE WITHOUT CONTRAST TECHNIQUE: Multidetector CT imaging of the head and cervical spine was performed following the standard protocol without intravenous contrast. Multiplanar CT image reconstructions of the cervical spine were also generated. COMPARISON:  03/17/2017 FINDINGS: CT HEAD FINDINGS Brain: Atrophic and chronic white matter ischemic changes are noted and stable. Previously seen right temporal hemorrhage has resolved in the interval. No significant sequelae are identified. No findings to suggest acute hemorrhage, acute infarction or space-occupying mass lesion is seen. Vascular: No hyperdense vessel or unexpected calcification. Skull: Normal. Negative for fracture or focal lesion. Sinuses/Orbits: No acute finding. Other: None. CT CERVICAL SPINE FINDINGS Alignment: Within normal limits Skull base and vertebrae: 7 cervical segments are well visualized. Vertebral body height is well maintained. Osteophytic and facet hypertrophic changes are noted. No acute fracture or acute facet abnormality is seen. Soft tissues and spinal canal: No soft tissue abnormality is noted. Upper chest: Small left pleural effusion is noted in the apex. Other: None IMPRESSION: CT of the head: Chronic changes without acute abnormality. Previously seen right temporal hemorrhage  has resolved in the interval. CT of the cervical spine: Degenerative change without acute abnormality. Electronically Signed   By: Inez Catalina M.D.   On: 10/02/2017 11:20   Dg Femur Min 2 Views Left  Result Date: 10/02/2017 CLINICAL DATA:  fell. Landed on bottom. Son assisted him to recliner and wife reports he has complained of left hip pain/pain right elbow/a fib/htn/diabetic EXAM:  LEFT FEMUR 2 VIEWS COMPARISON:  None. FINDINGS: There are fractures of the superior inferior pubic rami on the left. No left femur fracture.  No bone lesion. The hip and knee joints are normally aligned. There are scattered femoral or artery vascular calcifications. Soft tissues are otherwise unremarkable. IMPRESSION: 1. Fractures of the left superior inferior pubic rami. 2. No left femur fracture.  No dislocation. Electronically Signed   By: Lajean Manes M.D.   On: 10/02/2017 10:56    Review of Systems  HENT: Positive for nosebleeds.   Respiratory:       History of shortness of breath  Cardiovascular:       History of chest pain palpitations  Musculoskeletal: Positive for back pain, falls, joint pain and myalgias.  Skin:       Easy bleeding bruising on Eliquis  Neurological: Negative for focal weakness.   Blood pressure (!) 166/83, pulse 97, temperature 99.3 F (37.4 C), temperature source Oral, resp. rate 20, height '6\' 2"'$  (1.88 m), weight 199 lb 1.2 oz (90.3 kg), SpO2 97 %. Physical Exam  Constitutional:  Normal development no gross abnormalities no diaphoresis or distress  Musculoskeletal:  Upper extremity skin shows significant bruising and ecchymosis although there are no significant contractures of the major joints there is no tremor or atrophy all joints are stable without subluxation dislocation pulse and perfusion are normal, there is no lymphadenopathy.  Both lower extremities do not show the same amount of skin issue.  Motor function normal without atrophy  Joint no evidence of subluxation dislocation of the major joints hip knee and ankle  No tremor  No tenderness on the right tenderness in the groin pelvis on the left but the pelvis is stable  Pulse and perfusion normal  Neurologically intact bilaterally    Neurological: He is alert.  He is awake he is oriented to person and place not time  Grooming is normal hygiene is normal  Psychiatric: He has a normal mood  and affect. His behavior is normal.   Gait he cannot ambulate without falling and has significant pain although this was not observed it was reported by his wife and this is consistent with his fracture   Assessment/Plan: Left superior and inferior pubic ramus fracture  I discussed this with the patient and his wife it will not require surgery he is weightbearing as tolerated he will need an x-ray in 6 weeks he will need skilled nursing care  Arther Abbott 10/03/2017, 7:21 AM

## 2017-10-03 NOTE — Progress Notes (Signed)
PROGRESS NOTE    Richard [name]  ZDG:[id number] DOB: Jan 14, 1931 DOA: 10/02/2017 PCP: Prince Solian, MD     Brief Narrative:  Patient is an 84 year old man admitted from home on 2/8 with a history of frequent falls.  He has a history of A. fib but is not on anticoagulation due to his falls as well as history of subdural hematoma.  Also history of dementia.  After his fall he was unable to ambulate like he normally does.  He was found to have a pelvic fracture and admission requested.   Assessment & Plan:   Principal Problem:   Pelvic fracture (HCC) Active Problems:   Atrial fibrillation (HCC)   Cardiac pacemaker in situ   Ataxia due to recent stroke   Diabetes mellitus with complication (HCC)   Abnormality of gait as late effect of stroke   AVM (arteriovenous malformation) of small bowel, acquired (HCC)   Chronic diastolic CHF (congestive heart failure) ([place])   Intracranial hemorrhage ([place]) summer 2018   CKD (chronic kidney disease) stage 3, GFR 30-59 ml/min (HCC)   Frequent falls   Pelvic fracture -Seen by orthopedics, fracture has been deemed nonoperative. -Patient with difficulty ambulating and PT has recommended SNF placement. -We will ask social work to follow for placement. -Pain control as needed.  Atrial fibrillation -Rate controlled, not on anticoagulation due to history of frequent falls and recent subdural bleed last year.  Chronic diastolic heart failure -Compensated, continue home dose of Lasix.  Stage III chronic kidney disease -At baseline   DVT prophylaxis: SCDs Code Status: DNR Family Communication: Patient only Disposition Plan: Needs SNF  Consultants:   Orthopedics  Procedures:   None  Antimicrobials:  Anti-infectives (From admission, onward)   None       Subjective: In bed, appears confused, eating lunch  Objective: Vitals:   10/02/17 2045 10/02/17 2320 10/03/17 0700 10/03/17 0831  BP: (!) 166/83  (!) 161/80   Pulse: (!)  102 97 100   Resp: 20 20    Temp: 99.3 F (37.4 C)  99 F (37.2 C)   TempSrc: Oral     SpO2: 93% 97% 98% 91%  Weight: 90.3 kg (199 lb 1.2 oz)  90.3 kg (199 lb 1.2 oz)   Height: 6\' 2"  (1.88 m)       Intake/Output Summary (Last 24 hours) at 10/03/2017 1725 Last data filed at 10/03/2017 0700 Gross per 24 hour  Intake -  Output 200 ml  Net -200 ml   Filed Weights   10/02/17 2045 10/03/17 0700  Weight: 90.3 kg (199 lb 1.2 oz) 90.3 kg (199 lb 1.2 oz)    Examination:  General exam: Alert, awake, oriented x 2, not to time Respiratory system: Clear to auscultation. Respiratory effort normal. Cardiovascular system:RRR. No murmurs, rubs, gallops. Gastrointestinal system: Abdomen is nondistended, soft and nontender. No organomegaly or masses felt. Normal bowel sounds heard. Central nervous system: Alert and oriented. No focal neurological deficits. Extremities: No C/C/E, +pedal pulses Skin: No rashes, lesions or ulcers Psychiatry: Judgement and insight appear normal. Mood & affect appropriate.     Data Reviewed: I have personally reviewed following labs and imaging studies  CBC: Recent Labs  Lab 10/02/17 1017 10/03/17 0545  WBC 7.6 8.3  NEUTROABS 5.8  --   HGB 8.0* 7.3*  HCT 26.8* 24.8*  MCV 97.1 96.9  PLT 144* 951   Basic Metabolic Panel: Recent Labs  Lab 10/02/17 1017 10/03/17 0545  NA 135 138  K 4.8 5.2*  CL 97* 99*  CO2 29 27  GLUCOSE 149* 137*  BUN 29* 32*  CREATININE 1.49* 1.50*  CALCIUM 8.3* 8.4*   GFR: Estimated Creatinine Clearance: 41.9 mL/min (A) (by C-G formula based on SCr of 1.5 mg/dL (H)). Liver Function Tests: Recent Labs  Lab 10/02/17 1017  AST 24  ALT 14*  ALKPHOS 80  BILITOT 0.5  PROT 6.4*  ALBUMIN 2.2*   Recent Labs  Lab 10/02/17 1017  LIPASE 36   No results for input(s): AMMONIA in the last 168 hours. Coagulation Profile: No results for input(s): INR, PROTIME in the last 168 hours. Cardiac Enzymes: Recent Labs  Lab  10/02/17 1017 10/02/17 1804  TROPONINI 0.05* 0.04*   BNP (last 3 results) Recent Labs    07/21/17 1235 08/21/17 1026  PROBNP 13,733* 8,038*   HbA1C: No results for input(s): HGBA1C in the last 72 hours. CBG: Recent Labs  Lab 10/02/17 2204 10/03/17 0735 10/03/17 1111 10/03/17 1645  GLUCAP 143* 130* 113* 196*   Lipid Profile: No results for input(s): CHOL, HDL, LDLCALC, TRIG, CHOLHDL, LDLDIRECT in the last 72 hours. Thyroid Function Tests: No results for input(s): TSH, T4TOTAL, FREET4, T3FREE, THYROIDAB in the last 72 hours. Anemia Panel: No results for input(s): VITAMINB12, FOLATE, FERRITIN, TIBC, IRON, RETICCTPCT in the last 72 hours. Urine analysis:    Component Value Date/Time   COLORURINE YELLOW 10/02/2017 1005   APPEARANCEUR CLOUDY (A) 10/02/2017 1005   LABSPEC 1.014 10/02/2017 1005   PHURINE 5.0 10/02/2017 1005   GLUCOSEU NEGATIVE 10/02/2017 1005   HGBUR LARGE (A) 10/02/2017 1005   BILIRUBINUR NEGATIVE 10/02/2017 1005   KETONESUR NEGATIVE 10/02/2017 1005   PROTEINUR 30 (A) 10/02/2017 1005   UROBILINOGEN 0.2 03/30/2014 1400   NITRITE NEGATIVE 10/02/2017 1005   LEUKOCYTESUR LARGE (A) 10/02/2017 1005   Sepsis Labs: @LABRCNTIP (procalcitonin:4,lacticidven:4)  )No results found for this or any previous visit (from the past 240 hour(s)).       Radiology Studies: Dg Chest 1 View  Result Date: 10/02/2017 CLINICAL DATA:  fell. Landed on bottom. Son assisted him to recliner and wife reports he has complained of left hip pain/pain right elbow/a fib/htn/diabetic EXAM: CHEST 1 VIEW COMPARISON:  08/30/2017 FINDINGS: LEFT-sided pacemaker overlies stable enlarged cardiac silhouette. There is mild persistent airspace disease in the RIGHT upper lobe improved from comparison exam. Mild airspace disease in the RIGHT lower lobe. No pneumothorax. No fracture. IMPRESSION: 1. Extend this no evidence of thoracic trauma. 2. Much improved RIGHT upper lobe pneumonia. 3. Persistent but  improved bilateral pulmonary edema and interstitial edema. Electronically Signed   By: Suzy Bouchard M.D.   On: 10/02/2017 10:57   Dg Pelvis 1-2 Views  Result Date: 10/02/2017 CLINICAL DATA:  Fall, left hip pain. EXAM: PELVIS - 1-2 VIEW COMPARISON:  None. FINDINGS: Fractures are noted through the lateral left inferior pubic ramus and medial superior pubic ramus. The superior pubic ramus fracture extends into the pubic body. No proximal femoral abnormality. Degenerative changes in the hips bilaterally. IMPRESSION: Lateral inferior and medial superior pubic rami fractures on the left. No proximal femoral abnormality. Electronically Signed   By: Rolm Baptise M.D.   On: 10/02/2017 10:56   Dg Elbow 2 Views Right  Result Date: 10/02/2017 CLINICAL DATA:  fell. Landed on bottom. Son assisted him to recliner and wife reports he has complained of left hip pain/pain right elbow/a fib/htn/diabetic EXAM: RIGHT ELBOW - 2 VIEW COMPARISON:  None. FINDINGS: No fracture.  No bone lesion. The elbow joint is normally  spaced and aligned.  No joint effusion. Soft tissues are unremarkable. IMPRESSION: Negative. Electronically Signed   By: Lajean Manes M.D.   On: 10/02/2017 10:56   Ct Head Wo Contrast  Result Date: 10/02/2017 CLINICAL DATA:  Recent fall with headaches and neck pain, initial encounter EXAM: CT HEAD WITHOUT CONTRAST CT CERVICAL SPINE WITHOUT CONTRAST TECHNIQUE: Multidetector CT imaging of the head and cervical spine was performed following the standard protocol without intravenous contrast. Multiplanar CT image reconstructions of the cervical spine were also generated. COMPARISON:  03/17/2017 FINDINGS: CT HEAD FINDINGS Brain: Atrophic and chronic white matter ischemic changes are noted and stable. Previously seen right temporal hemorrhage has resolved in the interval. No significant sequelae are identified. No findings to suggest acute hemorrhage, acute infarction or space-occupying mass lesion is seen.  Vascular: No hyperdense vessel or unexpected calcification. Skull: Normal. Negative for fracture or focal lesion. Sinuses/Orbits: No acute finding. Other: None. CT CERVICAL SPINE FINDINGS Alignment: Within normal limits Skull base and vertebrae: 7 cervical segments are well visualized. Vertebral body height is well maintained. Osteophytic and facet hypertrophic changes are noted. No acute fracture or acute facet abnormality is seen. Soft tissues and spinal canal: No soft tissue abnormality is noted. Upper chest: Small left pleural effusion is noted in the apex. Other: None IMPRESSION: CT of the head: Chronic changes without acute abnormality. Previously seen right temporal hemorrhage has resolved in the interval. CT of the cervical spine: Degenerative change without acute abnormality. Electronically Signed   By: Inez Catalina M.D.   On: 10/02/2017 11:20   Ct Cervical Spine Wo Contrast  Result Date: 10/02/2017 CLINICAL DATA:  Recent fall with headaches and neck pain, initial encounter EXAM: CT HEAD WITHOUT CONTRAST CT CERVICAL SPINE WITHOUT CONTRAST TECHNIQUE: Multidetector CT imaging of the head and cervical spine was performed following the standard protocol without intravenous contrast. Multiplanar CT image reconstructions of the cervical spine were also generated. COMPARISON:  03/17/2017 FINDINGS: CT HEAD FINDINGS Brain: Atrophic and chronic white matter ischemic changes are noted and stable. Previously seen right temporal hemorrhage has resolved in the interval. No significant sequelae are identified. No findings to suggest acute hemorrhage, acute infarction or space-occupying mass lesion is seen. Vascular: No hyperdense vessel or unexpected calcification. Skull: Normal. Negative for fracture or focal lesion. Sinuses/Orbits: No acute finding. Other: None. CT CERVICAL SPINE FINDINGS Alignment: Within normal limits Skull base and vertebrae: 7 cervical segments are well visualized. Vertebral body height is well  maintained. Osteophytic and facet hypertrophic changes are noted. No acute fracture or acute facet abnormality is seen. Soft tissues and spinal canal: No soft tissue abnormality is noted. Upper chest: Small left pleural effusion is noted in the apex. Other: None IMPRESSION: CT of the head: Chronic changes without acute abnormality. Previously seen right temporal hemorrhage has resolved in the interval. CT of the cervical spine: Degenerative change without acute abnormality. Electronically Signed   By: Inez Catalina M.D.   On: 10/02/2017 11:20   Dg Femur Min 2 Views Left  Result Date: 10/02/2017 CLINICAL DATA:  fell. Landed on bottom. Son assisted him to recliner and wife reports he has complained of left hip pain/pain right elbow/a fib/htn/diabetic EXAM: LEFT FEMUR 2 VIEWS COMPARISON:  None. FINDINGS: There are fractures of the superior inferior pubic rami on the left. No left femur fracture.  No bone lesion. The hip and knee joints are normally aligned. There are scattered femoral or artery vascular calcifications. Soft tissues are otherwise unremarkable. IMPRESSION: 1. Fractures of the  left superior inferior pubic rami. 2. No left femur fracture.  No dislocation. Electronically Signed   By: Lajean Manes M.D.   On: 10/02/2017 10:56        Scheduled Meds: . acetaminophen  500 mg Oral BID  . amiodarone  200 mg Oral Q M,W,F  . aspirin EC  81 mg Oral Daily  . diazepam  2.5 mg Oral QHS  . divalproex  250 mg Oral BID  . DULoxetine  60 mg Oral QPM  . ferrous sulfate  325 mg Oral BID WC  . furosemide  40 mg Oral BID  . insulin aspart  0-9 Units Subcutaneous TID WC  . levothyroxine  50 mcg Oral QAC breakfast  . mouth rinse  15 mL Mouth Rinse BID  . metoprolol succinate  50 mg Oral Daily  . pantoprazole  40 mg Oral Q0600  . sodium chloride flush  3 mL Intravenous Q12H   Continuous Infusions: . sodium chloride       LOS: 1 day    Time spent: 25 minutes. Greater than 50% of this time was spent  in direct contact with the patient coordinating care.     Lelon Frohlich, MD [facility] Pager [phone]  If 7PM-7AM, please contact night-coverage [email or web address] Password [email or web address] 10/03/2017, 5:25 PM

## 2017-10-03 NOTE — Plan of Care (Signed)
Pt had great discomfort with logrolls; will remove oxygen at times. Will continue to monitor.

## 2017-10-04 LAB — GLUCOSE, CAPILLARY
GLUCOSE-CAPILLARY: 145 mg/dL — AB (ref 65–99)
GLUCOSE-CAPILLARY: 147 mg/dL — AB (ref 65–99)
Glucose-Capillary: 133 mg/dL — ABNORMAL HIGH (ref 65–99)
Glucose-Capillary: 137 mg/dL — ABNORMAL HIGH (ref 65–99)

## 2017-10-04 NOTE — NC FL2 (Signed)
[place] LEVEL OF CARE SCREENING TOOL     IDENTIFICATION  Patient Name: Richard [name] Birthdate: Feb 27, 1931 Sex: male Admission Date (Current Location): 10/02/2017  [facility] and [place] Number:  [employer] and Address:  [facility] [address] [place]      Provider Number: [id number]  Attending Physician Name and Address:  Isaac Bliss, [place]  Relative Name and Phone Number:  Dorin Stooksbury, spouse [phone]; Olene Craven, daughter [phone]    Current Level of Care: Hospital Recommended Level of Care: [place] Prior Approval Number:    Date Approved/Denied:   PASRR Number:  [id number]  Discharge Plan: SNF    Current Diagnoses: Patient Active Problem List   Diagnosis Date Noted  . Pelvic fracture ([place]) 10/02/2017  . Frequent falls 10/02/2017  . SOB (shortness of breath)   . Acute systolic congestive heart failure ([place])   . Encounter for hospice care discussion   . Palliative care encounter   . Goals of care, counseling/discussion   . Lobar pneumonia ([place]) 08/30/2017  . Pulmonary edema 08/28/2017  . Acute on chronic combined systolic and diastolic CHF (congestive heart failure) ([place]) 08/28/2017  . Acute diastolic CHF (congestive heart failure) ([place]) 05/22/2017  . CKD (chronic kidney disease) stage 3, GFR 30-59 ml/min (HCC) 05/22/2017  . Acute respiratory failure with hypoxia ([place]) 05/22/2017  . Intracranial hemorrhage [facility]) summer 2018 03/03/2017  . Head trauma 03/02/2017  . Chronic diastolic CHF (congestive heart failure) ([place]) 11/16/2016  . AVM (arteriovenous malformation) of small bowel, acquired ([place])   . Abnormality of gait as late effect of stroke 05/27/2016  . Benign neoplasm of cecum   . Benign neoplasm of sigmoid colon   . GI bleeding 05/22/2016  . Melena   . GIB (gastrointestinal bleeding) 05/21/2016  . Diabetes mellitus with complication ([place])   . Upper GI bleed   .  CKD (chronic kidney disease)   . Type 2 diabetes mellitus with circulatory disorder, without long-term current use of insulin ([place])   . Acute right PCA stroke ([place]) 05/08/2016  . Ataxia due to recent stroke   . Gait disturbance, post-stroke   . Cerebellar stroke, acute ([place]) 05/07/2016  . Hyperlipidemia   . Chronic anticoagulation   . Cardiac pacemaker in situ   . Acute blood loss anemia   . Cognitive deficits   . Cognitive deficit due to recent stroke   . Stroke-related cognitive dysfunction   . Generalized weakness 05/05/2016  . Dizziness   . Absolute anemia 02/08/2016  . Weakness 02/01/2016  . Gastric polyp 06/21/2015  . Iron deficiency anemia due to chronic blood loss   . GERD (gastroesophageal reflux disease)   . B12 deficiency anemia   . Bradycardia with less than 30 beats per minute 05/31/2014  . Complete heart block ([place]) 05/31/2014  . Syncope 05/31/2014  . Anemia 05/31/2014  . Atrial fibrillation ([place]) 04/08/2013  . Diabetes mellitus ([place]) 04/06/2013  . Hiatal hernia 04/06/2013  . Essential hypertension 06/13/2011  . OSA (obstructive sleep apnea) 06/13/2011    Orientation RESPIRATION BLADDER Height & Weight     Self, Situation, Place  Normal Continent Weight: 199 lb 1.2 oz (90.3 kg) Height:  6\' 2"  (188 cm)  BEHAVIORAL SYMPTOMS/MOOD NEUROLOGICAL BOWEL NUTRITION STATUS  (NA) (NA) Continent (NA)  AMBULATORY STATUS COMMUNICATION OF NEEDS Skin   Limited Assist Verbally Normal  Personal Care Assistance Level of Assistance  Bathing Bathing Assistance: Maximum assistance         Functional Limitations Info  Hearing   Hearing Info: Impaired      SPECIAL CARE FACTORS FREQUENCY  PT (By licensed PT)     PT Frequency: X6  week              Contractures Contractures Info: Not present    Additional Factors Info  Code Status, Allergies Code Status Info: DNR Allergies Info: Codeine, Citalopram           Current  Medications (10/04/2017):  This is the current hospital active medication list Current Facility-Administered Medications  Medication Dose Route Frequency Provider Last Rate Last Dose  . 0.9 %  sodium chloride infusion  250 mL Intravenous PRN Phillips Grout, MD      . acetaminophen (TYLENOL) tablet 650 mg  650 mg Oral Q6H PRN Phillips Grout, MD       Or  . acetaminophen (TYLENOL) suppository 650 mg  650 mg Rectal Q6H PRN Phillips Grout, MD      . acetaminophen (TYLENOL) tablet 500 mg  500 mg Oral BID Phillips Grout, MD   500 mg at 10/04/17 0849  . amiodarone (PACERONE) tablet 200 mg  200 mg Oral Q M,W,F Derrill Kay A, MD   200 mg at 10/02/17 2117  . aspirin EC tablet 81 mg  81 mg Oral Daily Derrill Kay A, MD   81 mg at 10/04/17 0849  . diazepam (VALIUM) tablet 2.5 mg  2.5 mg Oral QHS Derrill Kay A, MD   2.5 mg at 10/03/17 2319  . divalproex (DEPAKOTE) DR tablet 250 mg  250 mg Oral BID Phillips Grout, MD   250 mg at 10/04/17 0850  . DULoxetine (CYMBALTA) DR capsule 60 mg  60 mg Oral QPM Phillips Grout, MD   60 mg at 10/03/17 1814  . ferrous sulfate tablet 325 mg  325 mg Oral BID WC Derrill Kay A, MD   325 mg at 10/04/17 0850  . furosemide (LASIX) tablet 40 mg  40 mg Oral BID Phillips Grout, MD   40 mg at 10/04/17 0850  . insulin aspart (novoLOG) injection 0-9 Units  0-9 Units Subcutaneous TID WC Phillips Grout, MD   1 Units at 10/04/17 1212  . levothyroxine (SYNTHROID, LEVOTHROID) tablet 50 mcg  50 mcg Oral QAC breakfast Phillips Grout, MD   50 mcg at 10/04/17 0851  . MEDLINE mouth rinse  15 mL Mouth Rinse BID Isaac Bliss, Rayford Halsted, MD   15 mL at 10/04/17 0851  . metoprolol succinate (TOPROL-XL) 24 hr tablet 50 mg  50 mg Oral Daily Derrill Kay A, MD   50 mg at 10/04/17 0851  . ondansetron (ZOFRAN) tablet 4 mg  4 mg Oral Q6H PRN Phillips Grout, MD       Or  . ondansetron (ZOFRAN) injection 4 mg  4 mg Intravenous Q6H PRN Derrill Kay A, MD      . oxyCODONE (Oxy  IR/ROXICODONE) immediate release tablet 5 mg  5 mg Oral Q4H PRN Phillips Grout, MD      . pantoprazole (PROTONIX) EC tablet 40 mg  40 mg Oral Q0600 Phillips Grout, MD   40 mg at 10/04/17 0612  . sodium chloride flush (NS) 0.9 % injection 3 mL  3 mL Intravenous Q12H Derrill Kay A, MD   3 mL at 10/04/17 0852  .  sodium chloride flush (NS) 0.9 % injection 3 mL  3 mL Intravenous PRN Phillips Grout, MD       Facility-Administered Medications Ordered in Other Encounters  Medication Dose Route Frequency Provider Last Rate Last Dose  . 0.9 %  sodium chloride infusion   Intravenous Continuous Monia Sabal, PA-C         Discharge Medications: Please see discharge summary for a list of discharge medications.  Relevant Imaging Results:  Relevant Lab Results:   Additional Information SS# [id number]  Carolin Sicks, [place]

## 2017-10-04 NOTE — Progress Notes (Signed)
CSW was consulted by RN Lonn Georgia concerning disposition planning for pt.  CSW spoke with pt's spouse for SNF placements and assessment.    Reed Breech LCSWA [phone]

## 2017-10-04 NOTE — Clinical Social Work Note (Signed)
Clinical Social Work Assessment  Patient Details  Name: Richard [name] MRN: [id number] Date of Birth: 01/14/1931  Date of referral:  10/04/17               Reason for consult:  Discharge Planning                Permission sought to share information with:  Facility Sport and exercise psychologist, Family Supports Permission granted to share information::  Yes, Verbal Permission Granted  Name::     Ever Gustafson  Agency::  yes  Relationship::  yes  Contact Information:  yes  Housing/Transportation Living arrangements for the past 2 months:  [place] of Information:  Spouse Patient Interpreter Needed:  None Criminal Activity/Legal Involvement Pertinent to Current Situation/Hospitalization:  No - Comment as needed Significant Relationships:  Spouse, Adult Children Lives with:  Spouse Do you feel safe going back to the place where you live?  Yes Need for family participation in patient care:  Yes (Comment)  Care giving concerns:  CSW received consult regarding SNF Placement.  CSW spoke with pt's spouse.  Patient resides at home with spouse before admission to hospital.    Social Worker assessment / plan:  CSW spoke with pt's spouse regarding SNF placement.  Pt's spouse is agreeable for placement.  Employment status:  Retired Nurse, adult PT Recommendations:  [place] / Referral to community resources:  (NA)  Patient/Family's Response to care:  Pt's spouse reorts agreement with discharge plan.  Patient/Family's Understanding of and Emotional Response to Diagnosis, Current Treatment, and Prognosis:  Pt's spouse is realistic regarding therapy needs and expressed being hopeful for SNF Placement. Pt's spouse expressed understanding of CSW role and discharge process as well as medical condition.  No questions/concerns about plan or treatment.  Emotional Assessment Appearance:  Appears stated age Attitude/Demeanor/Rapport:   Unable to Assess Affect (typically observed):  Unable to Assess Orientation:  Oriented to Place, Oriented to Situation Alcohol / Substance use:  Not Applicable Psych involvement (Current and /or in the community):  No (Comment)  Discharge Needs  Concerns to be addressed:  No discharge needs identified Readmission within the last 30 days:  No Current discharge risk:  None Barriers to Discharge:  No Barriers Identified   Carolin Sicks, [place] 10/04/2017, 12:15 PM

## 2017-10-04 NOTE — Progress Notes (Signed)
Physical Therapy Treatment Patient Details Name: [name] MRN: [id number] DOB: 10-Aug-1931 Today's Date: 10/04/2017    History of Present Illness  Richard [name] is a 84 y.o. male with medical history significant of frequent falls, A. fib not on anticoagulation due to frequent falls, subdural hematoma in the summer 2018 from a fall, congestive heart failure, dementia, comes in after falling last night injuring himself.  Patient brought to the emergency department today as family was concerned because he was unable to ambulate like he normally does.  Patient appears to have very mild dementia.  He can answer questions reasonably.  His daughter is in wife are with him right now.  He is not had any recent illnesses.  He wakes up frequently in the night to go to urinate and this is when he usually does fall.  He normally uses a walker but he does not use the walker at night.  He got up last night to go to the bathroom and fell.  Patient found to have a pelvic fracture and referred for admission for evaluation for further treatment.  Orthopedic surgery has been called who has deemed this nonoperative.  Patient reports no pain right now as he is lying he does not really know if he can get up and walk and whether or not that will cause an extreme amount of pain.    PT Comments    PT participated in exercises more today but continues to be limited in his bed mobility.  Pt disposition to SNF remains the same.   Follow Up Recommendations  SNFOT      Equipment Recommendations  None recommended by PT none   Recommendations for Other Services       Precautions / Restrictions Precautions Precautions: Fall Restrictions Weight Bearing Restrictions: Yes LLE Weight Bearing: Non weight bearing    Mobility  Bed Mobility Overal bed mobility: Needs Assistance Bed Mobility: Rolling;Supine to Sit;Sit to Supine Rolling: Max assist   Supine to sit: Max assist Sit to supine: Max assist   General  bed mobility comments: Continues to refuse to sit up                           Cognition Arousal/Alertness: Lethargic Behavior During Therapy: WFL for tasks assessed/performed Overall Cognitive Status: Within Functional Limits for tasks assessed                                        Exercises General Exercises - Lower Extremity Ankle Circles/Pumps: Both;10 reps Quad Sets: Both;10 reps Gluteal Sets: Both;10 reps Short Arc Quad: Right;10 reps Heel Slides: Both;5 reps;Limitations Heel Slides Limitations: Lt passive to only 40 degrees         Pertinent Vitals/Pain  9/10 with motion     Home Living    with spouse                   Prior Function   Minimal ambulation with walker          PT Goals (current goals can now be found in the care plan section) Acute Rehab PT Goals PT Goal Formulation: With patient/family Time For Goal Achievement: 10/06/17 Potential to Achieve Goals: Good Progress towards PT goals: Not progressing toward goals - comment(Pt lethargic state and pain level )    Frequency    Min 6X/week  PT Plan Current plan remains appropriate          End of Session   Activity Tolerance: Patient limited by pain Patient left: in bed;with bed alarm set;with family/visitor present   PT Visit Diagnosis: Muscle weakness (generalized) (M62.81);History of falling (Z91.81);Difficulty in walking, not elsewhere classified (R26.2)     Time: 9532-0233 PT Time Calculation (min) (ACUTE ONLY): 18 min  Charges:  $Therapeutic Exercise: 8-22 mins                        Rayetta Humphrey, PT CLT [phone] 10/04/2017, 3:53 PM

## 2017-10-04 NOTE — Progress Notes (Signed)
PROGRESS NOTE    Richard [name]  NOB:[id number] DOB: September 12, 1930 DOA: 10/02/2017 PCP: Prince Solian, MD     Brief Narrative:  Patient is an 84 year old man admitted from home on 2/8 with a history of frequent falls.  He has a history of A. fib but is not on anticoagulation due to his falls as well as history of subdural hematoma.  Also history of dementia.  After his fall he was unable to ambulate like he normally does.  He was found to have a pelvic fracture and admission requested.   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 -Patient with difficulty ambulating and PT has recommended SNF placement. -We will ask social work to follow for placement. -Pain control as needed.  Atrial fibrillation -Rate controlled, not on anticoagulation due to history of frequent falls and recent subdural bleed last year.  Chronic diastolic heart failure -Compensated, continue home dose of Lasix.  Stage III chronic kidney disease -At baseline   DVT prophylaxis: SCDs Code Status: DNR Family Communication: Patient only Disposition Plan: Needs SNF  Consultants:   Orthopedics  Procedures:   None  Antimicrobials:  Anti-infectives (From admission, onward)   None       Subjective: Lying in bed, sleeping but easily arousable, states his pain in his hip is much improved.  Objective: Vitals:   10/03/17 2103 10/04/17 0018 10/04/17 0817 10/04/17  1338  BP: (!) 165/81   (!) 141/66  Pulse: 88 87  80  Resp: 20 20  17   Temp: 97.8 F (36.6 C)   (!) 97.5 F (36.4 C)  TempSrc: Oral   Oral  SpO2: 97% 92% 94% 98%  Weight:      Height:        Intake/Output Summary (Last 24 hours) at 10/04/2017 1635 Last data filed at 10/04/2017 0900 Gross per 24 hour  Intake 0 ml  Output 700 ml  Net -700 ml   Filed Weights   10/02/17 2045 10/03/17 0700  Weight: 90.3 kg (199 lb 1.2 oz) 90.3 kg (199 lb 1.2 oz)    Examination:  General exam: Alert, awake, oriented x 3 Respiratory system: Clear to auscultation. Respiratory effort normal. Cardiovascular system:RRR. No murmurs, rubs, gallops. Gastrointestinal system: Abdomen is nondistended, soft and nontender. No organomegaly or masses felt. Normal bowel sounds heard. Central nervous system: Alert and oriented. No focal neurological deficits. Extremities: No C/C/E, +pedal pulses Skin: No rashes, lesions or ulcers Psychiatry: Judgement and insight appear normal. Mood & affect appropriate.      Data Reviewed: I have personally reviewed following labs and imaging studies  CBC: Recent Labs  Lab 10/02/17 1017 10/03/17 0545  WBC 7.6 8.3  NEUTROABS 5.8  --   HGB 8.0* 7.3*  HCT 26.8* 24.8*  MCV 97.1 96.9  PLT 144* 947   Basic Metabolic Panel: Recent Labs  Lab 10/02/17 1017 10/03/17 0545  NA 135 138  K 4.8 5.2*  CL 97* 99*  CO2 29 27  GLUCOSE 149* 137*  BUN 29* 32*  CREATININE 1.49* 1.50*  CALCIUM 8.3* 8.4*   GFR: Estimated Creatinine Clearance: 41.9 mL/min (A) (by C-G formula based on SCr of 1.5 mg/dL (H)). Liver Function Tests: Recent Labs  Lab 10/02/17 1017  AST 24  ALT 14*  ALKPHOS 80  BILITOT 0.5  PROT 6.4*  ALBUMIN 2.2*   Recent Labs  Lab 10/02/17 1017  LIPASE 36   No results for input(s): AMMONIA in the last 168 hours. Coagulation Profile: No results for input(s): INR, PROTIME in the last 168 hours. Cardiac Enzymes: Recent Labs  Lab 10/02/17 1017  10/02/17 1804  TROPONINI 0.05* 0.04*   BNP (last 3 results) Recent Labs    07/21/17 1235 08/21/17 1026  PROBNP 13,733* 8,038*   HbA1C: No results for input(s): HGBA1C in the last 72 hours. CBG: Recent Labs  Lab 10/03/17 1111 10/03/17 1645 10/03/17 2052 10/04/17 0730 10/04/17 1207  GLUCAP 113* 196* 101* 145* 147*   Lipid Profile: No results for input(s): CHOL, HDL, LDLCALC, TRIG, CHOLHDL, LDLDIRECT in the last 72 hours. Thyroid Function Tests: No results for input(s): TSH, T4TOTAL, FREET4, T3FREE, THYROIDAB in the last 72 hours. Anemia Panel: No results for input(s): VITAMINB12, FOLATE, FERRITIN, TIBC, IRON, RETICCTPCT in the last 72 hours. Urine analysis:    Component Value Date/Time   COLORURINE YELLOW 10/02/2017 1005   APPEARANCEUR CLOUDY (A) 10/02/2017 1005   LABSPEC 1.014 10/02/2017 1005   PHURINE 5.0 10/02/2017 1005   GLUCOSEU NEGATIVE 10/02/2017 1005   HGBUR LARGE (A) 10/02/2017 1005   BILIRUBINUR NEGATIVE 10/02/2017 1005   KETONESUR NEGATIVE 10/02/2017 1005   PROTEINUR 30 (A) 10/02/2017 1005   UROBILINOGEN 0.2 03/30/2014 1400   NITRITE NEGATIVE 10/02/2017 1005   LEUKOCYTESUR LARGE (A) 10/02/2017 1005   Sepsis Labs: @LABRCNTIP (procalcitonin:4,lacticidven:4)  )No results found for this or any previous visit (from the past 240 hour(s)).       Radiology Studies: No results found.      Scheduled Meds: . acetaminophen  500 mg Oral BID  . amiodarone  200 mg Oral Q M,W,F  . aspirin EC  81 mg Oral Daily  . diazepam  2.5 mg Oral QHS  . divalproex  250 mg Oral BID  . DULoxetine  60 mg Oral QPM  . ferrous sulfate  325 mg Oral BID WC  . furosemide  40 mg Oral BID  . insulin aspart  0-9 Units Subcutaneous TID WC  . levothyroxine  50 mcg Oral QAC breakfast  . mouth rinse  15 mL Mouth Rinse BID  . metoprolol succinate  50 mg Oral Daily  . pantoprazole  40 mg Oral Q0600  . sodium chloride flush  3 mL Intravenous Q12H   Continuous Infusions: . sodium  chloride       LOS: 2 days    Time spent: 25 minutes. Greater than 50% of this time was spent in direct contact with the patient coordinating care.     Lelon Frohlich, MD [facility] Pager [phone]  If 7PM-7AM, please contact night-coverage [email or web address] Password [facility] 10/04/2017, 4:35 PM

## 2017-10-05 ENCOUNTER — Encounter: Payer: Medicare Other | Admitting: Internal Medicine

## 2017-10-05 LAB — GLUCOSE, CAPILLARY
GLUCOSE-CAPILLARY: 93 mg/dL (ref 65–99)
Glucose-Capillary: 111 mg/dL — ABNORMAL HIGH (ref 65–99)
Glucose-Capillary: 132 mg/dL — ABNORMAL HIGH (ref 65–99)
Glucose-Capillary: 238 mg/dL — ABNORMAL HIGH (ref 65–99)

## 2017-10-05 MED ORDER — OXYCODONE HCL 5 MG PO TABS: 5.0000 mg | ORAL_TABLET | ORAL | 0 refills | Status: AC | PRN

## 2017-10-05 NOTE — Progress Notes (Signed)
Physical Therapy Treatment Patient Details Name: Richard [name] MRN: [id number] DOB: February 17, 1931 Today's Date: 10/05/2017    History of Present Illness  Richard [name] is a 84 y.o. male with medical history significant of frequent falls, A. fib not on anticoagulation due to frequent falls, subdural hematoma in the summer 2018 from a fall, congestive heart failure, dementia, comes in after falling last night injuring himself.  Patient brought to the emergency department today as family was concerned because he was unable to ambulate like he normally does.  Patient appears to have very mild dementia.  He can answer questions reasonably.  His daughter is in wife are with him right now.  He is not had any recent illnesses.  He wakes up frequently in the night to go to urinate and this is when he usually does fall.  He normally uses a walker but he does not use the walker at night.  He got up last night to go to the bathroom and fell.  Patient found to have a pelvic fracture and referred for admission for evaluation for further treatment.  Orthopedic surgery has been called who has deemed this nonoperative.  Patient reports no pain right now as he is lying he does not really know if he can get up and walk and whether or not that will cause an extreme amount of pain.    PT Comments    Patient demonstrates increased tolerance for bedside activity, able to take a few side steps with RW, unsafe to step away from bedside due to BLE weakness and c/o increased  LLE/groin pain with weigtbearing/taking steps.  Patient required assistance to move BLE when put back to bed.  Patient will benefit from continued physical therapy in hospital and recommended venue below to increase strength, balance, endurance for safe ADLs and gait.    Follow Up Recommendations  SNF     Equipment Recommendations  None recommended by PT    Recommendations for Other Services       Precautions / Restrictions  Precautions Precautions: Fall Restrictions Weight Bearing Restrictions: Yes LLE Weight Bearing: Weight bearing as tolerated    Mobility  Bed Mobility Overal bed mobility: Needs Assistance Bed Mobility: Supine to Sit;Sit to Supine     Supine to sit: Mod assist Sit to supine: Mod assist      Transfers Overall transfer level: Needs assistance Equipment used: Rolling walker (2 wheeled) Transfers: Sit to/from Stand Sit to Stand: Mod assist         General transfer comment: with RW  Ambulation/Gait Ambulation/Gait assistance: Max assist Ambulation Distance (Feet): 3 Feet Assistive device: Rolling walker (2 wheeled) Gait Pattern/deviations: Decreased step length - left;Decreased step length - right;Decreased stride length   Gait velocity interpretation: Below normal speed for age/gender General Gait Details: patient limited to 3-4 side steps at bedside x 2 trials due to increasing LLE/groin pain when weight bearing, tolerated standing for up to 3-4 minutes before requesting to sit   Stairs            Wheelchair Mobility    Modified Rankin (Stroke Patients Only)       Balance Overall balance assessment: Needs assistance Sitting-balance support: Feet supported;Bilateral upper extremity supported Sitting balance-Leahy Scale: Fair     Standing balance support: Bilateral upper extremity supported;During functional activity Standing balance-Leahy Scale: Poor Standing balance comment: fair/poor with RW  Cognition Arousal/Alertness: Awake/alert Behavior During Therapy: WFL for tasks assessed/performed Overall Cognitive Status: Within Functional Limits for tasks assessed                                        Exercises General Exercises - Lower Extremity Ankle Circles/Pumps: Seated;AROM;Strengthening;Both;10 reps Long Arc Quad: Seated;AROM;Strengthening;Both;5 reps    General Comments        Pertinent  Vitals/Pain Pain Assessment: Faces Faces Pain Scale: Hurts even more Pain Location: left groin area with LLE movement Pain Descriptors / Indicators: Sharp;Aching;Grimacing Pain Intervention(s): Limited activity within patient's tolerance;Monitored during session;Premedicated before session    Home Living                      Prior Function            PT Goals (current goals can now be found in the care plan section) Acute Rehab PT Goals PT Goal Formulation: With patient Time For Goal Achievement: 10/19/17 Potential to Achieve Goals: Good Progress towards PT goals: Progressing toward goals    Frequency    Min 6X/week      PT Plan Current plan remains appropriate    Co-evaluation              AM-PAC PT "6 Clicks" Daily Activity  Outcome Measure  Difficulty turning over in bed (including adjusting bedclothes, sheets and blankets)?: A Lot Difficulty moving from lying on back to sitting on the side of the bed? : A Lot Difficulty sitting down on and standing up from a chair with arms (e.g., wheelchair, bedside commode, etc,.)?: A Lot Help needed moving to and from a bed to chair (including a wheelchair)?: A Lot Help needed walking in hospital room?: Total Help needed climbing 3-5 steps with a railing? : Total 6 Click Score: 10    End of Session   Activity Tolerance: Patient limited by pain;Patient tolerated treatment well Patient left: in bed;with call bell/phone within reach;with bed alarm set;with family/visitor present Nurse Communication: Mobility status PT Visit Diagnosis: Muscle weakness (generalized) (M62.81);History of falling (Z91.81);Difficulty in walking, not elsewhere classified (R26.2)     Time: 3212-2482 PT Time Calculation (min) (ACUTE ONLY): 28 min  Charges:  $Therapeutic Activity: 23-37 mins                    G Codes:       11:23 AM, 10/20/17 Lonell Grandchild, MPT Physical Therapist with [facility] [phone]  office [phone] mobile phone

## 2017-10-05 NOTE — Discharge Summary (Signed)
Physician Discharge Summary  Richard [name] XTG:[id number] DOB: 10-Aug-1931 DOA: 10/02/2017  PCP: Prince Solian, MD  Admit date: 10/02/2017 Discharge date: 10/05/2017  Time spent: 45 minutes  Recommendations for Outpatient Follow-up:  -Will be discharged to SNF today for ST-rehab following acute pelvic fractures.   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 Shanon Brow on 2/8:  Richard [name] is a 84 y.o. male with medical history significant of frequent falls, A. fib not on anticoagulation due to frequent falls, subdural hematoma in the summer 2018 from a fall, congestive heart failure, dementia, comes in after falling last night injuring himself.  Patient brought to the emergency department today as family was concerned because he was unable to ambulate like he normally does.  Patient appears to have very mild dementia.  He can answer questions reasonably.  His daughter is in wife are with him right now.  He is not had any recent illnesses.  He wakes up frequently in the night to go to urinate and this is when he usually does fall.  He normally uses a walker but he does not use the walker at night.  He got up last night to go to the bathroom and fell.  Patient found to have a pelvic fracture and  referred for admission for evaluation for further treatment.  Orthopedic surgery has been called who has deemed this nonoperative.  Patient reports no pain right now as he is lying he does not really know if he can get up and walk and whether or not that will cause an extreme amount of pain.    Hospital Course:   Pelvic fracture -Seen by orthopedics, fracture has been deemed nonoperative. -Patient with difficulty ambulating and PT has recommended SNF placement. -Pain is well controlled on tylenol and occasional oxy IR.  Atrial fibrillation -Rate controlled, not on anticoagulation due to history of frequent falls and recent subdural bleed last year.  Chronic diastolic heart failure -Compensated, continue home dose of Lasix.  Stage III chronic kidney disease -At baseline    Procedures:  None   Consultations:  None  Discharge Instructions  Discharge Instructions    Diet - low sodium heart healthy   Complete by:  As directed    Increase activity slowly   Complete by:  As directed      Allergies as of 10/05/2017      Reactions   Codeine Other (See Comments)   Hallucinations   Citalopram Swelling, Rash      Medication List    STOP taking these medications   amoxicillin-clavulanate 875-125 MG tablet Commonly known as:  AUGMENTIN     TAKE these medications   acetaminophen 500 MG tablet Commonly known as:  TYLENOL Take  500 mg by mouth 2 (two) times daily.   amiodarone 200 MG tablet Commonly known as:  PACERONE Take 200 mg by mouth every Monday, Wednesday, and Friday.   aspirin EC 81 MG tablet Take 1 tablet (81 mg total) by mouth daily.   diazepam 5 MG tablet Commonly known as:  VALIUM Take 0.5 tablets (2.5 mg total) by mouth at bedtime.   divalproex 250 MG DR tablet Commonly known as:  DEPAKOTE Take 1 tablet by mouth 2 (two) times daily.   DULoxetine 60 MG capsule Commonly known as:  CYMBALTA Take 60 mg by mouth every evening.   ferrous sulfate  325 (65 FE) MG tablet Take 325 mg by mouth 2 (two) times daily with a meal.   fluticasone 50 MCG/ACT nasal spray Commonly known as:  FLONASE Place 1 spray into the nose daily as needed for allergies or rhinitis.   furosemide 40 MG tablet Commonly known as:  LASIX Take 1 tablet (40 mg total) by mouth 2 (two) times daily.   levothyroxine 50 MCG tablet Commonly known as:  SYNTHROID, LEVOTHROID Take 50 mcg by mouth daily before breakfast.   metoprolol succinate 50 MG 24 hr tablet Commonly known as:  TOPROL-XL Take 1 tablet (50 mg total) by mouth daily. Take with or immediately following a meal.   oxyCODONE 5 MG immediate release tablet Commonly known as:  Oxy IR/ROXICODONE Take 1 tablet (5 mg total) by mouth every 4 (four) hours as needed for moderate pain.   pantoprazole 40 MG tablet Commonly known as:  PROTONIX Take 1 tablet (40 mg total) by mouth daily at 6 (six) AM.   potassium chloride 10 MEQ tablet Commonly known as:  K-DUR Take 2 tablets (20 mEq total) by mouth daily.      Allergies  Allergen Reactions  . Codeine Other (See Comments)    Hallucinations   . Citalopram Swelling and Rash   Contact information for after-discharge care    Destination    [facility] .   Service:  Skilled Nursing Contact information: [place] [phone]               The results of significant diagnostics from this hospitalization (including imaging, microbiology, ancillary and laboratory) are listed below for reference.    Significant Diagnostic Studies: Dg Chest 1 View  Result Date: 10/02/2017 CLINICAL DATA:  fell. Landed on bottom. Son assisted him to recliner and wife reports he has complained of left hip pain/pain right elbow/a fib/htn/diabetic EXAM: CHEST 1 VIEW COMPARISON:  08/30/2017 FINDINGS: LEFT-sided pacemaker overlies stable enlarged cardiac silhouette. There is mild persistent airspace disease in the RIGHT upper  lobe improved from comparison exam. Mild airspace disease in the RIGHT lower lobe. No pneumothorax. No fracture. IMPRESSION: 1. Extend this no evidence of thoracic trauma. 2. Much improved RIGHT upper lobe pneumonia. 3. Persistent but improved bilateral pulmonary edema and interstitial edema. Electronically Signed   By: Suzy Bouchard M.D.   On: 10/02/2017 10:57   Dg Pelvis 1-2 Views  Result Date: 10/02/2017 CLINICAL DATA:  Fall, left hip pain. EXAM: PELVIS - 1-2 VIEW COMPARISON:  None. FINDINGS: Fractures are noted through the lateral left inferior pubic ramus and medial superior pubic ramus. The superior pubic ramus fracture extends into the pubic body. No proximal femoral abnormality. Degenerative changes in the hips bilaterally. IMPRESSION: Lateral inferior and medial superior pubic rami fractures on the left. No proximal femoral abnormality. Electronically Signed   By: Lennette Bihari  [place] M.D.   On: 10/02/2017 10:56   Dg Elbow 2 Views Right  Result Date: 10/02/2017 CLINICAL DATA:  fell. Landed on bottom. Son assisted him to recliner and wife reports he has complained of left hip pain/pain right elbow/a fib/htn/diabetic EXAM: RIGHT ELBOW - 2 VIEW COMPARISON:  None. FINDINGS: No fracture.  No bone lesion. The elbow joint is normally spaced and aligned.  No joint effusion. Soft tissues are unremarkable. IMPRESSION: Negative. Electronically Signed   By: Lajean Manes M.D.   On: 10/02/2017 10:56   Ct Head Wo Contrast  Result Date: 10/02/2017 CLINICAL DATA:  Recent fall with headaches and neck pain, initial encounter EXAM: CT HEAD WITHOUT CONTRAST CT CERVICAL SPINE WITHOUT CONTRAST TECHNIQUE: Multidetector CT imaging of the head and cervical spine was performed following the standard protocol without intravenous contrast. Multiplanar CT image reconstructions of the cervical spine were also generated. COMPARISON:  03/17/2017 FINDINGS: CT HEAD FINDINGS Brain: Atrophic and chronic white matter ischemic changes are  noted and stable. Previously seen right temporal hemorrhage has resolved in the interval. No significant sequelae are identified. No findings to suggest acute hemorrhage, acute infarction or space-occupying mass lesion is seen. Vascular: No hyperdense vessel or unexpected calcification. Skull: Normal. Negative for fracture or focal lesion. Sinuses/Orbits: No acute finding. Other: None. CT CERVICAL SPINE FINDINGS Alignment: Within normal limits Skull base and vertebrae: 7 cervical segments are well visualized. Vertebral body height is well maintained. Osteophytic and facet hypertrophic changes are noted. No acute fracture or acute facet abnormality is seen. Soft tissues and spinal canal: No soft tissue abnormality is noted. Upper chest: Small left pleural effusion is noted in the apex. Other: None IMPRESSION: CT of the head: Chronic changes without acute abnormality. Previously seen right temporal hemorrhage has resolved in the interval. CT of the cervical spine: Degenerative change without acute abnormality. Electronically Signed   By: Inez Catalina M.D.   On: 10/02/2017 11:20   Ct Cervical Spine Wo Contrast  Result Date: 10/02/2017 CLINICAL DATA:  Recent fall with headaches and neck pain, initial encounter EXAM: CT HEAD WITHOUT CONTRAST CT CERVICAL SPINE WITHOUT CONTRAST TECHNIQUE: Multidetector CT imaging of the head and cervical spine was performed following the standard protocol without intravenous contrast. Multiplanar CT image reconstructions of the cervical spine were also generated. COMPARISON:  03/17/2017 FINDINGS: CT HEAD FINDINGS Brain: Atrophic and chronic white matter ischemic changes are noted and stable. Previously seen right temporal hemorrhage has resolved in the interval. No significant sequelae are identified. No findings to suggest acute hemorrhage, acute infarction or space-occupying mass lesion is seen. Vascular: No hyperdense vessel or unexpected calcification. Skull: Normal. Negative for  fracture or focal lesion. Sinuses/Orbits: No acute finding. Other: None. CT CERVICAL SPINE FINDINGS Alignment: Within normal limits Skull base and vertebrae: 7 cervical segments are well visualized. Vertebral body height is well maintained. Osteophytic and facet hypertrophic changes are noted. No acute fracture or acute facet abnormality is seen. Soft tissues and spinal canal: No soft tissue abnormality is noted. Upper chest: Small left pleural effusion is noted in the apex. Other: None IMPRESSION: CT of the head: Chronic changes without acute abnormality. Previously seen right temporal hemorrhage has resolved in the interval. CT of the cervical spine: Degenerative change without acute abnormality. Electronically Signed   By: Inez Catalina M.D.   On: 10/02/2017 11:20   Dg Femur Min 2 Views Left  Result Date: 10/02/2017 CLINICAL DATA:  fell. Landed on bottom. Son assisted him to recliner and wife reports he  has complained of left hip pain/pain right elbow/a fib/htn/diabetic EXAM: LEFT FEMUR 2 VIEWS COMPARISON:  None. FINDINGS: There are fractures of the superior inferior pubic rami on the left. No left femur fracture.  No bone lesion. The hip and knee joints are normally aligned. There are scattered femoral or artery vascular calcifications. Soft tissues are otherwise unremarkable. IMPRESSION: 1. Fractures of the left superior inferior pubic rami. 2. No left femur fracture.  No dislocation. Electronically Signed   By: Lajean Manes M.D.   On: 10/02/2017 10:56    Microbiology: No results found for this or any previous visit (from the past 240 hour(s)).   Labs: Basic Metabolic Panel: Recent Labs  Lab 10/02/17 1017 10/03/17 0545  NA 135 138  K 4.8 5.2*  CL 97* 99*  CO2 29 27  GLUCOSE 149* 137*  BUN 29* 32*  CREATININE 1.49* 1.50*  CALCIUM 8.3* 8.4*   Liver Function Tests: Recent Labs  Lab 10/02/17 1017  AST 24  ALT 14*  ALKPHOS 80  BILITOT 0.5  PROT 6.4*  ALBUMIN 2.2*   Recent Labs    Lab 10/02/17 1017  LIPASE 36   No results for input(s): AMMONIA in the last 168 hours. CBC: Recent Labs  Lab 10/02/17 1017 10/03/17 0545  WBC 7.6 8.3  NEUTROABS 5.8  --   HGB 8.0* 7.3*  HCT 26.8* 24.8*  MCV 97.1 96.9  PLT 144* 156   Cardiac Enzymes: Recent Labs  Lab 10/02/17 1017 10/02/17 1804  TROPONINI 0.05* 0.04*   BNP: BNP (last 3 results) Recent Labs    08/28/17 0446 09/01/17 0431 10/02/17 1017  BNP 1,351.0* 553.0* 789.0*    ProBNP (last 3 results) Recent Labs    07/21/17 1235 08/21/17 1026  PROBNP 13,733* 8,038*    CBG: Recent Labs  Lab 10/04/17 1207 10/04/17 1650 10/04/17 2151 10/05/17 0749 10/05/17 1119  GLUCAP 147* 133* 137* 111* 132*       Signed:  Lelon Frohlich  [facility] Pager: [phone] 10/05/2017, 4:18 PM

## 2017-10-05 NOTE — Progress Notes (Signed)
Patient not able to be discharged, called [facility] and was informed that Richard [name] was not on their admission list for today and the Admissions Dept. Had left for the day.A message was left for our Education officer, museum. MD made aware.

## 2017-10-05 NOTE — Care Management Important Message (Signed)
Important Message  Patient Details  Name: Richard [name] MRN: [id number] Date of Birth: 1931/04/19   Medicare Important Message Given:  Yes    Sherald Barge, RN 10/05/2017, 1:52 PM

## 2017-10-06 DIAGNOSIS — R293 Abnormal posture: Secondary | ICD-10-CM | POA: Diagnosis not present

## 2017-10-06 DIAGNOSIS — F39 Unspecified mood [affective] disorder: Secondary | ICD-10-CM | POA: Diagnosis not present

## 2017-10-06 DIAGNOSIS — I130 Hypertensive heart and chronic kidney disease with heart failure and stage 1 through stage 4 chronic kidney disease, or unspecified chronic kidney disease: Secondary | ICD-10-CM | POA: Diagnosis not present

## 2017-10-06 DIAGNOSIS — Z7401 Bed confinement status: Secondary | ICD-10-CM | POA: Diagnosis not present

## 2017-10-06 DIAGNOSIS — S3289XD Fracture of other parts of pelvis, subsequent encounter for fracture with routine healing: Secondary | ICD-10-CM | POA: Diagnosis not present

## 2017-10-06 DIAGNOSIS — I5032 Chronic diastolic (congestive) heart failure: Secondary | ICD-10-CM | POA: Diagnosis not present

## 2017-10-06 DIAGNOSIS — E119 Type 2 diabetes mellitus without complications: Secondary | ICD-10-CM | POA: Diagnosis not present

## 2017-10-06 DIAGNOSIS — S329XXA Fracture of unspecified parts of lumbosacral spine and pelvis, initial encounter for closed fracture: Secondary | ICD-10-CM | POA: Diagnosis not present

## 2017-10-06 DIAGNOSIS — I480 Paroxysmal atrial fibrillation: Secondary | ICD-10-CM | POA: Diagnosis not present

## 2017-10-06 DIAGNOSIS — E1122 Type 2 diabetes mellitus with diabetic chronic kidney disease: Secondary | ICD-10-CM | POA: Diagnosis not present

## 2017-10-06 DIAGNOSIS — R279 Unspecified lack of coordination: Secondary | ICD-10-CM | POA: Diagnosis not present

## 2017-10-06 DIAGNOSIS — M6281 Muscle weakness (generalized): Secondary | ICD-10-CM | POA: Diagnosis not present

## 2017-10-06 DIAGNOSIS — F0281 Dementia in other diseases classified elsewhere with behavioral disturbance: Secondary | ICD-10-CM | POA: Diagnosis not present

## 2017-10-06 DIAGNOSIS — I442 Atrioventricular block, complete: Secondary | ICD-10-CM | POA: Diagnosis not present

## 2017-10-06 DIAGNOSIS — R278 Other lack of coordination: Secondary | ICD-10-CM | POA: Diagnosis not present

## 2017-10-06 DIAGNOSIS — N183 Chronic kidney disease, stage 3 (moderate): Secondary | ICD-10-CM | POA: Diagnosis not present

## 2017-10-06 LAB — GLUCOSE, CAPILLARY: GLUCOSE-CAPILLARY: 118 mg/dL — AB (ref 65–99)

## 2017-10-06 NOTE — Progress Notes (Signed)
Received verbal order from Dr. Jerilee Hoh for patient to receive 2.5 mg valium now instead of bedtime.

## 2017-10-06 NOTE — Progress Notes (Signed)
Patient discharged to [facility], called report to nurse, and patient's daughter is to pick up patient's pain script at nurses' station and take to [facility]. IVs removed and sites intact. Patient discharged with personal belongings.

## 2017-10-06 NOTE — Progress Notes (Signed)
Pt's wife some what upset regarding  bi-pap and the  time it's taking to have Resp put it on. Informed wife that Resp is aware and would be here as soon as possible.

## 2017-10-06 NOTE — Clinical Social Work Placement (Signed)
   CLINICAL SOCIAL WORK PLACEMENT  NOTE  Date:  10/06/2017  Patient Details  Name: Richard [name] MRN: [id number] Date of Birth: 01/23/1931  Clinical Social Work is seeking post-discharge placement for this patient at the [place] level of care (*CSW will initial, date and re-position this form in  chart as items are completed):  Yes   Patient/family provided with [facility] Work Department's list of facilities offering this level of care within the geographic area requested by the patient (or if unable, by the patient's family).  Yes   Patient/family informed of their freedom to choose among providers that offer the needed level of care, that participate in Medicare, Medicaid or managed care program needed by the patient, have an available bed and are willing to accept the patient.  Yes   Patient/family informed of [facility]'s ownership interest in [facility] and [facility], as well as of the fact that they are under no obligation to receive care at these facilities.  PASRR submitted to EDS on 09/27/17     PASRR number received on 09/27/17     Existing PASRR number confirmed on       FL2 transmitted to all facilities in geographic area requested by pt/family on 09/27/17     FL2 transmitted to all facilities within larger geographic area on       Patient informed that his/her managed care company has contracts with or will negotiate with certain facilities, including the following:        Yes   Patient/family informed of bed offers received.  Patient chooses bed at [facility]     Physician recommends and patient chooses bed at      Patient to be transferred to [facility] on 10/06/17.  Patient to be transferred to facility by [facility]     Patient family notified on 10/06/17 of transfer.  Name of family member notified:  spouse's line was busy. Message left for daughter     PHYSICIAN       Additional Comment:  Seth Bake at [facility] notified. Discharge clinicals sent. LCSW signing off.   _______________________________________________ Ihor Gully, LCSW 10/06/2017, 9:21 AM

## 2017-10-08 ENCOUNTER — Encounter: Payer: Medicare Other | Admitting: Internal Medicine

## 2017-10-14 ENCOUNTER — Ambulatory Visit (INDEPENDENT_AMBULATORY_CARE_PROVIDER_SITE_OTHER): Payer: Medicare Other | Admitting: *Deleted

## 2017-10-14 DIAGNOSIS — I442 Atrioventricular block, complete: Secondary | ICD-10-CM | POA: Diagnosis not present

## 2017-10-14 NOTE — Progress Notes (Signed)
Remote pacemaker transmission.   

## 2017-10-15 ENCOUNTER — Encounter: Payer: Self-pay | Admitting: Cardiology

## 2017-10-21 DIAGNOSIS — S3289XD Fracture of other parts of pelvis, subsequent encounter for fracture with routine healing: Secondary | ICD-10-CM | POA: Diagnosis not present

## 2017-10-21 DIAGNOSIS — I442 Atrioventricular block, complete: Secondary | ICD-10-CM | POA: Diagnosis not present

## 2017-10-21 DIAGNOSIS — N183 Chronic kidney disease, stage 3 (moderate): Secondary | ICD-10-CM | POA: Diagnosis not present

## 2017-10-21 DIAGNOSIS — I69393 Ataxia following cerebral infarction: Secondary | ICD-10-CM | POA: Diagnosis not present

## 2017-10-21 DIAGNOSIS — E039 Hypothyroidism, unspecified: Secondary | ICD-10-CM | POA: Diagnosis not present

## 2017-10-21 DIAGNOSIS — F419 Anxiety disorder, unspecified: Secondary | ICD-10-CM | POA: Diagnosis not present

## 2017-10-21 DIAGNOSIS — F329 Major depressive disorder, single episode, unspecified: Secondary | ICD-10-CM | POA: Diagnosis not present

## 2017-10-21 DIAGNOSIS — I69318 Other symptoms and signs involving cognitive functions following cerebral infarction: Secondary | ICD-10-CM | POA: Diagnosis not present

## 2017-10-21 DIAGNOSIS — Q2733 Arteriovenous malformation of digestive system vessel: Secondary | ICD-10-CM | POA: Diagnosis not present

## 2017-10-21 DIAGNOSIS — I130 Hypertensive heart and chronic kidney disease with heart failure and stage 1 through stage 4 chronic kidney disease, or unspecified chronic kidney disease: Secondary | ICD-10-CM | POA: Diagnosis not present

## 2017-10-21 DIAGNOSIS — I480 Paroxysmal atrial fibrillation: Secondary | ICD-10-CM | POA: Diagnosis not present

## 2017-10-21 DIAGNOSIS — G4733 Obstructive sleep apnea (adult) (pediatric): Secondary | ICD-10-CM | POA: Diagnosis not present

## 2017-10-21 DIAGNOSIS — R634 Abnormal weight loss: Secondary | ICD-10-CM | POA: Diagnosis not present

## 2017-10-21 DIAGNOSIS — Z9181 History of falling: Secondary | ICD-10-CM | POA: Diagnosis not present

## 2017-10-21 DIAGNOSIS — I5032 Chronic diastolic (congestive) heart failure: Secondary | ICD-10-CM | POA: Diagnosis not present

## 2017-10-21 DIAGNOSIS — E785 Hyperlipidemia, unspecified: Secondary | ICD-10-CM | POA: Diagnosis not present

## 2017-10-21 DIAGNOSIS — S41112D Laceration without foreign body of left upper arm, subsequent encounter: Secondary | ICD-10-CM | POA: Diagnosis not present

## 2017-10-21 DIAGNOSIS — E119 Type 2 diabetes mellitus without complications: Secondary | ICD-10-CM | POA: Diagnosis not present

## 2017-10-21 DIAGNOSIS — Z950 Presence of cardiac pacemaker: Secondary | ICD-10-CM | POA: Diagnosis not present

## 2017-10-21 DIAGNOSIS — S41111D Laceration without foreign body of right upper arm, subsequent encounter: Secondary | ICD-10-CM | POA: Diagnosis not present

## 2017-10-21 DIAGNOSIS — M1991 Primary osteoarthritis, unspecified site: Secondary | ICD-10-CM | POA: Diagnosis not present

## 2017-10-21 DIAGNOSIS — Z9981 Dependence on supplemental oxygen: Secondary | ICD-10-CM | POA: Diagnosis not present

## 2017-10-21 DIAGNOSIS — K219 Gastro-esophageal reflux disease without esophagitis: Secondary | ICD-10-CM | POA: Diagnosis not present

## 2017-10-21 DIAGNOSIS — F0150 Vascular dementia without behavioral disturbance: Secondary | ICD-10-CM | POA: Diagnosis not present

## 2017-10-21 LAB — CUP PACEART REMOTE DEVICE CHECK
Battery Remaining Longevity: 126 mo
Battery Voltage: 2.79 V
Brady Statistic AP VS Percent: 0 %
Brady Statistic AS VP Percent: 47 %
Date Time Interrogation Session: 20190220172357
Implantable Lead Implant Date: 20151010
Implantable Lead Location: 753860
Implantable Pulse Generator Implant Date: 20151010
Lead Channel Pacing Threshold Amplitude: 0.625 V
Lead Channel Pacing Threshold Pulse Width: 0.4 ms
Lead Channel Setting Pacing Amplitude: 2 V
Lead Channel Setting Pacing Pulse Width: 0.4 ms
MDC IDC LEAD IMPLANT DT: 20151010
MDC IDC LEAD LOCATION: 753859
MDC IDC MSMT BATTERY IMPEDANCE: 188 Ohm
MDC IDC MSMT LEADCHNL RA IMPEDANCE VALUE: 436 Ohm
MDC IDC MSMT LEADCHNL RA PACING THRESHOLD AMPLITUDE: 0.625 V
MDC IDC MSMT LEADCHNL RA PACING THRESHOLD PULSEWIDTH: 0.4 ms
MDC IDC MSMT LEADCHNL RV IMPEDANCE VALUE: 622 Ohm
MDC IDC SET LEADCHNL RV PACING AMPLITUDE: 2.5 V
MDC IDC SET LEADCHNL RV SENSING SENSITIVITY: 5.6 mV
MDC IDC STAT BRADY AP VP PERCENT: 0 %
MDC IDC STAT BRADY AS VS PERCENT: 53 %

## 2017-10-22 DIAGNOSIS — I5032 Chronic diastolic (congestive) heart failure: Secondary | ICD-10-CM | POA: Diagnosis not present

## 2017-10-22 DIAGNOSIS — N183 Chronic kidney disease, stage 3 (moderate): Secondary | ICD-10-CM | POA: Diagnosis not present

## 2017-10-22 DIAGNOSIS — I480 Paroxysmal atrial fibrillation: Secondary | ICD-10-CM | POA: Diagnosis not present

## 2017-10-22 DIAGNOSIS — E785 Hyperlipidemia, unspecified: Secondary | ICD-10-CM | POA: Diagnosis not present

## 2017-10-22 DIAGNOSIS — I69318 Other symptoms and signs involving cognitive functions following cerebral infarction: Secondary | ICD-10-CM | POA: Diagnosis not present

## 2017-10-22 DIAGNOSIS — I130 Hypertensive heart and chronic kidney disease with heart failure and stage 1 through stage 4 chronic kidney disease, or unspecified chronic kidney disease: Secondary | ICD-10-CM | POA: Diagnosis not present

## 2017-10-23 DIAGNOSIS — S41111D Laceration without foreign body of right upper arm, subsequent encounter: Secondary | ICD-10-CM | POA: Diagnosis not present

## 2017-10-23 DIAGNOSIS — S3289XD Fracture of other parts of pelvis, subsequent encounter for fracture with routine healing: Secondary | ICD-10-CM | POA: Diagnosis not present

## 2017-10-23 DIAGNOSIS — Z9981 Dependence on supplemental oxygen: Secondary | ICD-10-CM | POA: Diagnosis not present

## 2017-10-23 DIAGNOSIS — E039 Hypothyroidism, unspecified: Secondary | ICD-10-CM | POA: Diagnosis not present

## 2017-10-23 DIAGNOSIS — N183 Chronic kidney disease, stage 3 (moderate): Secondary | ICD-10-CM | POA: Diagnosis not present

## 2017-10-23 DIAGNOSIS — I69318 Other symptoms and signs involving cognitive functions following cerebral infarction: Secondary | ICD-10-CM | POA: Diagnosis not present

## 2017-10-23 DIAGNOSIS — Z950 Presence of cardiac pacemaker: Secondary | ICD-10-CM | POA: Diagnosis not present

## 2017-10-23 DIAGNOSIS — R634 Abnormal weight loss: Secondary | ICD-10-CM | POA: Diagnosis not present

## 2017-10-23 DIAGNOSIS — F419 Anxiety disorder, unspecified: Secondary | ICD-10-CM | POA: Diagnosis not present

## 2017-10-23 DIAGNOSIS — Z9181 History of falling: Secondary | ICD-10-CM | POA: Diagnosis not present

## 2017-10-23 DIAGNOSIS — I130 Hypertensive heart and chronic kidney disease with heart failure and stage 1 through stage 4 chronic kidney disease, or unspecified chronic kidney disease: Secondary | ICD-10-CM | POA: Diagnosis not present

## 2017-10-23 DIAGNOSIS — I69393 Ataxia following cerebral infarction: Secondary | ICD-10-CM | POA: Diagnosis not present

## 2017-10-23 DIAGNOSIS — M1991 Primary osteoarthritis, unspecified site: Secondary | ICD-10-CM | POA: Diagnosis not present

## 2017-10-23 DIAGNOSIS — I442 Atrioventricular block, complete: Secondary | ICD-10-CM | POA: Diagnosis not present

## 2017-10-23 DIAGNOSIS — K219 Gastro-esophageal reflux disease without esophagitis: Secondary | ICD-10-CM | POA: Diagnosis not present

## 2017-10-23 DIAGNOSIS — E785 Hyperlipidemia, unspecified: Secondary | ICD-10-CM | POA: Diagnosis not present

## 2017-10-23 DIAGNOSIS — F329 Major depressive disorder, single episode, unspecified: Secondary | ICD-10-CM | POA: Diagnosis not present

## 2017-10-23 DIAGNOSIS — I480 Paroxysmal atrial fibrillation: Secondary | ICD-10-CM | POA: Diagnosis not present

## 2017-10-23 DIAGNOSIS — I5032 Chronic diastolic (congestive) heart failure: Secondary | ICD-10-CM | POA: Diagnosis not present

## 2017-10-23 DIAGNOSIS — S41112D Laceration without foreign body of left upper arm, subsequent encounter: Secondary | ICD-10-CM | POA: Diagnosis not present

## 2017-10-23 DIAGNOSIS — E119 Type 2 diabetes mellitus without complications: Secondary | ICD-10-CM | POA: Diagnosis not present

## 2017-10-23 DIAGNOSIS — G4733 Obstructive sleep apnea (adult) (pediatric): Secondary | ICD-10-CM | POA: Diagnosis not present

## 2017-10-23 DIAGNOSIS — Q2733 Arteriovenous malformation of digestive system vessel: Secondary | ICD-10-CM | POA: Diagnosis not present

## 2017-10-23 DIAGNOSIS — F0150 Vascular dementia without behavioral disturbance: Secondary | ICD-10-CM | POA: Diagnosis not present

## 2017-10-26 ENCOUNTER — Telehealth: Payer: Self-pay | Admitting: Cardiovascular Disease

## 2017-10-26 DIAGNOSIS — I480 Paroxysmal atrial fibrillation: Secondary | ICD-10-CM | POA: Diagnosis not present

## 2017-10-26 DIAGNOSIS — I5032 Chronic diastolic (congestive) heart failure: Secondary | ICD-10-CM | POA: Diagnosis not present

## 2017-10-26 DIAGNOSIS — N183 Chronic kidney disease, stage 3 (moderate): Secondary | ICD-10-CM | POA: Diagnosis not present

## 2017-10-26 DIAGNOSIS — I130 Hypertensive heart and chronic kidney disease with heart failure and stage 1 through stage 4 chronic kidney disease, or unspecified chronic kidney disease: Secondary | ICD-10-CM | POA: Diagnosis not present

## 2017-10-26 DIAGNOSIS — E785 Hyperlipidemia, unspecified: Secondary | ICD-10-CM | POA: Diagnosis not present

## 2017-10-26 DIAGNOSIS — I69318 Other symptoms and signs involving cognitive functions following cerebral infarction: Secondary | ICD-10-CM | POA: Diagnosis not present

## 2017-10-26 NOTE — Telephone Encounter (Signed)
I appreciate her letting us know

## 2017-10-26 NOTE — Telephone Encounter (Signed)
Spoke with patient's wife, Dorian Pod, who called to advise [place] that patient is now in a SNF and will likely not be able to go back home. She states she cancelled patient's appointment with Dr. Acie Fredrickson for next week but wanted [place] to know the situation. I advised her to call back if patient needs advice from Dr. Acie Fredrickson regarding his care. She verbalized understanding and gratitude and appreciated the call back.

## 2017-10-26 NOTE — Telephone Encounter (Signed)
New Message   Patients wife is calling to advise that the patient is now in assisted living facility [facility]. Please call.

## 2017-10-27 DIAGNOSIS — I69318 Other symptoms and signs involving cognitive functions following cerebral infarction: Secondary | ICD-10-CM | POA: Diagnosis not present

## 2017-10-27 DIAGNOSIS — I130 Hypertensive heart and chronic kidney disease with heart failure and stage 1 through stage 4 chronic kidney disease, or unspecified chronic kidney disease: Secondary | ICD-10-CM | POA: Diagnosis not present

## 2017-10-27 DIAGNOSIS — E785 Hyperlipidemia, unspecified: Secondary | ICD-10-CM | POA: Diagnosis not present

## 2017-10-27 DIAGNOSIS — N183 Chronic kidney disease, stage 3 (moderate): Secondary | ICD-10-CM | POA: Diagnosis not present

## 2017-10-27 DIAGNOSIS — I480 Paroxysmal atrial fibrillation: Secondary | ICD-10-CM | POA: Diagnosis not present

## 2017-10-27 DIAGNOSIS — I5032 Chronic diastolic (congestive) heart failure: Secondary | ICD-10-CM | POA: Diagnosis not present

## 2017-10-28 DIAGNOSIS — I480 Paroxysmal atrial fibrillation: Secondary | ICD-10-CM | POA: Diagnosis not present

## 2017-10-28 DIAGNOSIS — E785 Hyperlipidemia, unspecified: Secondary | ICD-10-CM | POA: Diagnosis not present

## 2017-10-28 DIAGNOSIS — I130 Hypertensive heart and chronic kidney disease with heart failure and stage 1 through stage 4 chronic kidney disease, or unspecified chronic kidney disease: Secondary | ICD-10-CM | POA: Diagnosis not present

## 2017-10-28 DIAGNOSIS — N183 Chronic kidney disease, stage 3 (moderate): Secondary | ICD-10-CM | POA: Diagnosis not present

## 2017-10-28 DIAGNOSIS — I69318 Other symptoms and signs involving cognitive functions following cerebral infarction: Secondary | ICD-10-CM | POA: Diagnosis not present

## 2017-10-28 DIAGNOSIS — I5032 Chronic diastolic (congestive) heart failure: Secondary | ICD-10-CM | POA: Diagnosis not present

## 2017-10-29 DIAGNOSIS — I480 Paroxysmal atrial fibrillation: Secondary | ICD-10-CM | POA: Diagnosis not present

## 2017-10-29 DIAGNOSIS — F39 Unspecified mood [affective] disorder: Secondary | ICD-10-CM | POA: Diagnosis not present

## 2017-10-29 DIAGNOSIS — E785 Hyperlipidemia, unspecified: Secondary | ICD-10-CM | POA: Diagnosis not present

## 2017-10-29 DIAGNOSIS — I5032 Chronic diastolic (congestive) heart failure: Secondary | ICD-10-CM | POA: Diagnosis not present

## 2017-10-29 DIAGNOSIS — I69318 Other symptoms and signs involving cognitive functions following cerebral infarction: Secondary | ICD-10-CM | POA: Diagnosis not present

## 2017-10-29 DIAGNOSIS — I130 Hypertensive heart and chronic kidney disease with heart failure and stage 1 through stage 4 chronic kidney disease, or unspecified chronic kidney disease: Secondary | ICD-10-CM | POA: Diagnosis not present

## 2017-10-29 DIAGNOSIS — N183 Chronic kidney disease, stage 3 (moderate): Secondary | ICD-10-CM | POA: Diagnosis not present

## 2017-10-30 DIAGNOSIS — I69318 Other symptoms and signs involving cognitive functions following cerebral infarction: Secondary | ICD-10-CM | POA: Diagnosis not present

## 2017-10-30 DIAGNOSIS — N183 Chronic kidney disease, stage 3 (moderate): Secondary | ICD-10-CM | POA: Diagnosis not present

## 2017-10-30 DIAGNOSIS — E785 Hyperlipidemia, unspecified: Secondary | ICD-10-CM | POA: Diagnosis not present

## 2017-10-30 DIAGNOSIS — I130 Hypertensive heart and chronic kidney disease with heart failure and stage 1 through stage 4 chronic kidney disease, or unspecified chronic kidney disease: Secondary | ICD-10-CM | POA: Diagnosis not present

## 2017-10-30 DIAGNOSIS — I5032 Chronic diastolic (congestive) heart failure: Secondary | ICD-10-CM | POA: Diagnosis not present

## 2017-10-30 DIAGNOSIS — I480 Paroxysmal atrial fibrillation: Secondary | ICD-10-CM | POA: Diagnosis not present

## 2017-11-02 DIAGNOSIS — I69318 Other symptoms and signs involving cognitive functions following cerebral infarction: Secondary | ICD-10-CM | POA: Diagnosis not present

## 2017-11-02 DIAGNOSIS — E785 Hyperlipidemia, unspecified: Secondary | ICD-10-CM | POA: Diagnosis not present

## 2017-11-02 DIAGNOSIS — I130 Hypertensive heart and chronic kidney disease with heart failure and stage 1 through stage 4 chronic kidney disease, or unspecified chronic kidney disease: Secondary | ICD-10-CM | POA: Diagnosis not present

## 2017-11-02 DIAGNOSIS — I480 Paroxysmal atrial fibrillation: Secondary | ICD-10-CM | POA: Diagnosis not present

## 2017-11-02 DIAGNOSIS — N183 Chronic kidney disease, stage 3 (moderate): Secondary | ICD-10-CM | POA: Diagnosis not present

## 2017-11-02 DIAGNOSIS — I5032 Chronic diastolic (congestive) heart failure: Secondary | ICD-10-CM | POA: Diagnosis not present

## 2017-11-03 DIAGNOSIS — F0151 Vascular dementia with behavioral disturbance: Secondary | ICD-10-CM | POA: Diagnosis not present

## 2017-11-03 DIAGNOSIS — E785 Hyperlipidemia, unspecified: Secondary | ICD-10-CM | POA: Diagnosis not present

## 2017-11-03 DIAGNOSIS — I69318 Other symptoms and signs involving cognitive functions following cerebral infarction: Secondary | ICD-10-CM | POA: Diagnosis not present

## 2017-11-03 DIAGNOSIS — E119 Type 2 diabetes mellitus without complications: Secondary | ICD-10-CM | POA: Diagnosis not present

## 2017-11-03 DIAGNOSIS — J9611 Chronic respiratory failure with hypoxia: Secondary | ICD-10-CM | POA: Diagnosis not present

## 2017-11-03 DIAGNOSIS — F39 Unspecified mood [affective] disorder: Secondary | ICD-10-CM | POA: Diagnosis not present

## 2017-11-03 DIAGNOSIS — S329XXA Fracture of unspecified parts of lumbosacral spine and pelvis, initial encounter for closed fracture: Secondary | ICD-10-CM | POA: Diagnosis not present

## 2017-11-03 DIAGNOSIS — I480 Paroxysmal atrial fibrillation: Secondary | ICD-10-CM | POA: Diagnosis not present

## 2017-11-03 DIAGNOSIS — I5032 Chronic diastolic (congestive) heart failure: Secondary | ICD-10-CM | POA: Diagnosis not present

## 2017-11-03 DIAGNOSIS — I130 Hypertensive heart and chronic kidney disease with heart failure and stage 1 through stage 4 chronic kidney disease, or unspecified chronic kidney disease: Secondary | ICD-10-CM | POA: Diagnosis not present

## 2017-11-03 DIAGNOSIS — N183 Chronic kidney disease, stage 3 (moderate): Secondary | ICD-10-CM | POA: Diagnosis not present

## 2017-11-04 ENCOUNTER — Telehealth: Payer: Self-pay

## 2017-11-04 DIAGNOSIS — I480 Paroxysmal atrial fibrillation: Secondary | ICD-10-CM | POA: Diagnosis not present

## 2017-11-04 DIAGNOSIS — I69318 Other symptoms and signs involving cognitive functions following cerebral infarction: Secondary | ICD-10-CM | POA: Diagnosis not present

## 2017-11-04 DIAGNOSIS — N183 Chronic kidney disease, stage 3 (moderate): Secondary | ICD-10-CM | POA: Diagnosis not present

## 2017-11-04 DIAGNOSIS — I5032 Chronic diastolic (congestive) heart failure: Secondary | ICD-10-CM | POA: Diagnosis not present

## 2017-11-04 DIAGNOSIS — E785 Hyperlipidemia, unspecified: Secondary | ICD-10-CM | POA: Diagnosis not present

## 2017-11-04 DIAGNOSIS — I130 Hypertensive heart and chronic kidney disease with heart failure and stage 1 through stage 4 chronic kidney disease, or unspecified chronic kidney disease: Secondary | ICD-10-CM | POA: Diagnosis not present

## 2017-11-04 NOTE — Telephone Encounter (Signed)
PLEASE NOTE: All timestamps contained within this report are represented as [place] Standard Time. CONFIDENTIALTY NOTICE: This fax transmission is intended only for the addressee. It contains information that is legally privileged, confidential or otherwise protected from use or disclosure. If you are not the intended recipient, you are strictly prohibited from reviewing, disclosing, copying using or disseminating any of this information or taking any action in reliance on or regarding this information. If you have received this fax in error, please notify us immediately by telephone so that we can arrange for its return to [place]. Phone: [phone], Toll-Free: [phone], Fax: [phone] Page: 1 of 1 Call Id: [id number] [place] Patient Name: Richard [name] Gender: Male DOB: 12-13-1930 Age: 84 Y 69 M 12 D Return Phone Number: [phone] (Primary) Address: City/State/Zip: [place] Client [place] Night - Client Client Site [place] Physician Viviana Simpler - MD Contact Type Call Who Is [place] / [place] Call Type Triage / Clinical Caller Name Cherrie Distance Relationship To Patient Care Giver Return Phone Number [phone] (Primary) Facility Name [facility] Number [phone] Chief Complaint Paging or Request for Consult Reason for Call Request to speak to Physician Initial Comment Caller states she is calling from [facility], needing orders. Additional Comment Translation No Nurse Assessment Nurse: Neil Crouch, RN, [place] Janus Date/Time Eilene Ghazi Time): 11/04/2017 4:09:31 AM Confirm and document reason for call. If symptomatic, describe symptoms. ----Caller states she is calling from [facility], needing orders. was wanting to get a one time dose of ativan but he has calmed down now. has  0.5mg  ordered every 8. next dose is due at 0600. Does the patient have any new or worsening symptoms? ---No Guidelines Guideline Title Affirmed Question Affirmed Notes Nurse Date/Time (Eastern Time) Disp. Time Eilene Ghazi Time) Disposition Final User 11/04/2017 4:12:37 AM Clinical Call Yes Neil Crouch, RN, Baker Janus

## 2017-11-04 NOTE — Telephone Encounter (Signed)
We are working on behavioral measures but I will add prn order as well

## 2017-11-05 ENCOUNTER — Ambulatory Visit: Payer: Medicare Other | Admitting: Cardiovascular Disease

## 2017-11-05 DIAGNOSIS — N183 Chronic kidney disease, stage 3 (moderate): Secondary | ICD-10-CM | POA: Diagnosis not present

## 2017-11-05 DIAGNOSIS — E785 Hyperlipidemia, unspecified: Secondary | ICD-10-CM | POA: Diagnosis not present

## 2017-11-05 DIAGNOSIS — I130 Hypertensive heart and chronic kidney disease with heart failure and stage 1 through stage 4 chronic kidney disease, or unspecified chronic kidney disease: Secondary | ICD-10-CM | POA: Diagnosis not present

## 2017-11-05 DIAGNOSIS — I69318 Other symptoms and signs involving cognitive functions following cerebral infarction: Secondary | ICD-10-CM | POA: Diagnosis not present

## 2017-11-05 DIAGNOSIS — I5032 Chronic diastolic (congestive) heart failure: Secondary | ICD-10-CM | POA: Diagnosis not present

## 2017-11-05 DIAGNOSIS — I480 Paroxysmal atrial fibrillation: Secondary | ICD-10-CM | POA: Diagnosis not present

## 2017-11-06 DIAGNOSIS — I480 Paroxysmal atrial fibrillation: Secondary | ICD-10-CM | POA: Diagnosis not present

## 2017-11-06 DIAGNOSIS — I5032 Chronic diastolic (congestive) heart failure: Secondary | ICD-10-CM | POA: Diagnosis not present

## 2017-11-06 DIAGNOSIS — E785 Hyperlipidemia, unspecified: Secondary | ICD-10-CM | POA: Diagnosis not present

## 2017-11-06 DIAGNOSIS — N183 Chronic kidney disease, stage 3 (moderate): Secondary | ICD-10-CM | POA: Diagnosis not present

## 2017-11-06 DIAGNOSIS — I130 Hypertensive heart and chronic kidney disease with heart failure and stage 1 through stage 4 chronic kidney disease, or unspecified chronic kidney disease: Secondary | ICD-10-CM | POA: Diagnosis not present

## 2017-11-06 DIAGNOSIS — I69318 Other symptoms and signs involving cognitive functions following cerebral infarction: Secondary | ICD-10-CM | POA: Diagnosis not present

## 2017-11-09 DIAGNOSIS — F39 Unspecified mood [affective] disorder: Secondary | ICD-10-CM | POA: Diagnosis not present

## 2017-11-09 DIAGNOSIS — I130 Hypertensive heart and chronic kidney disease with heart failure and stage 1 through stage 4 chronic kidney disease, or unspecified chronic kidney disease: Secondary | ICD-10-CM | POA: Diagnosis not present

## 2017-11-09 DIAGNOSIS — I69318 Other symptoms and signs involving cognitive functions following cerebral infarction: Secondary | ICD-10-CM | POA: Diagnosis not present

## 2017-11-09 DIAGNOSIS — E785 Hyperlipidemia, unspecified: Secondary | ICD-10-CM | POA: Diagnosis not present

## 2017-11-09 DIAGNOSIS — N183 Chronic kidney disease, stage 3 (moderate): Secondary | ICD-10-CM | POA: Diagnosis not present

## 2017-11-09 DIAGNOSIS — I5032 Chronic diastolic (congestive) heart failure: Secondary | ICD-10-CM | POA: Diagnosis not present

## 2017-11-09 DIAGNOSIS — I480 Paroxysmal atrial fibrillation: Secondary | ICD-10-CM | POA: Diagnosis not present

## 2017-11-10 DIAGNOSIS — I130 Hypertensive heart and chronic kidney disease with heart failure and stage 1 through stage 4 chronic kidney disease, or unspecified chronic kidney disease: Secondary | ICD-10-CM | POA: Diagnosis not present

## 2017-11-10 DIAGNOSIS — I5032 Chronic diastolic (congestive) heart failure: Secondary | ICD-10-CM | POA: Diagnosis not present

## 2017-11-10 DIAGNOSIS — N183 Chronic kidney disease, stage 3 (moderate): Secondary | ICD-10-CM | POA: Diagnosis not present

## 2017-11-10 DIAGNOSIS — I69318 Other symptoms and signs involving cognitive functions following cerebral infarction: Secondary | ICD-10-CM | POA: Diagnosis not present

## 2017-11-10 DIAGNOSIS — I480 Paroxysmal atrial fibrillation: Secondary | ICD-10-CM | POA: Diagnosis not present

## 2017-11-10 DIAGNOSIS — E785 Hyperlipidemia, unspecified: Secondary | ICD-10-CM | POA: Diagnosis not present

## 2017-11-11 ENCOUNTER — Telehealth: Payer: Self-pay | Admitting: Internal Medicine

## 2017-11-11 DIAGNOSIS — N183 Chronic kidney disease, stage 3 (moderate): Secondary | ICD-10-CM | POA: Diagnosis not present

## 2017-11-11 DIAGNOSIS — I5032 Chronic diastolic (congestive) heart failure: Secondary | ICD-10-CM | POA: Diagnosis not present

## 2017-11-11 DIAGNOSIS — I130 Hypertensive heart and chronic kidney disease with heart failure and stage 1 through stage 4 chronic kidney disease, or unspecified chronic kidney disease: Secondary | ICD-10-CM | POA: Diagnosis not present

## 2017-11-11 DIAGNOSIS — I69318 Other symptoms and signs involving cognitive functions following cerebral infarction: Secondary | ICD-10-CM | POA: Diagnosis not present

## 2017-11-11 DIAGNOSIS — I480 Paroxysmal atrial fibrillation: Secondary | ICD-10-CM | POA: Diagnosis not present

## 2017-11-11 DIAGNOSIS — E785 Hyperlipidemia, unspecified: Secondary | ICD-10-CM | POA: Diagnosis not present

## 2017-11-11 NOTE — Telephone Encounter (Signed)
Vira Agar (with [facility]) called [phone]) regarding increased "behavioral issues" with pt.  On questioning her, she reports that pt has haad behavioral issues since he has been admitted to facility.  Has prn ativan ordered tid prn and has ativan gel q 12 hours prn (just given within the last hour).  Called requesting haldol for agitation.  In reviewing chart and in talking with Vira Agar, this is not new behavior.  Was just treated for UTI.  Nurse reports just discussed behavior with Dr Silvio Pate within the last 24 hours.  Also has discussed with family and Hospice.  Wife desires not to add additional medication at this time.  I explained I would forward information to you.  Has prn written and just received ativan gel, so will hold on any additional medication at this time.  Of note, FYI.  Hospice discussed with family regarding moving pt to [facility].

## 2017-11-11 NOTE — Telephone Encounter (Signed)
Inetta Fermo., RN from [facility] called about patient, she says "the patient is having behavioral issues and I would like to speak to the on call doctor to get an order for Haldol IM." I called Dr. Nicki Reaper, the on call physician, and gave the patient's information, nurse's name and phone number, she said she will call her back. I informed Vira Agar that Dr. Nicki Reaper will be calling her, she verbalized understanding.

## 2017-11-12 DIAGNOSIS — N183 Chronic kidney disease, stage 3 (moderate): Secondary | ICD-10-CM | POA: Diagnosis not present

## 2017-11-12 DIAGNOSIS — I69318 Other symptoms and signs involving cognitive functions following cerebral infarction: Secondary | ICD-10-CM | POA: Diagnosis not present

## 2017-11-12 DIAGNOSIS — R451 Restlessness and agitation: Secondary | ICD-10-CM | POA: Diagnosis not present

## 2017-11-12 DIAGNOSIS — E785 Hyperlipidemia, unspecified: Secondary | ICD-10-CM | POA: Diagnosis not present

## 2017-11-12 DIAGNOSIS — I480 Paroxysmal atrial fibrillation: Secondary | ICD-10-CM | POA: Diagnosis not present

## 2017-11-12 DIAGNOSIS — I130 Hypertensive heart and chronic kidney disease with heart failure and stage 1 through stage 4 chronic kidney disease, or unspecified chronic kidney disease: Secondary | ICD-10-CM | POA: Diagnosis not present

## 2017-11-12 DIAGNOSIS — I5032 Chronic diastolic (congestive) heart failure: Secondary | ICD-10-CM | POA: Diagnosis not present

## 2017-11-12 NOTE — Telephone Encounter (Signed)
He may be going into terminal restlessness. I did order haldol IM Spoke to wife and Tammi RN from hospice---we may work towards transfer to Hospice home

## 2017-11-13 DIAGNOSIS — I69318 Other symptoms and signs involving cognitive functions following cerebral infarction: Secondary | ICD-10-CM | POA: Diagnosis not present

## 2017-11-13 DIAGNOSIS — I5032 Chronic diastolic (congestive) heart failure: Secondary | ICD-10-CM | POA: Diagnosis not present

## 2017-11-13 DIAGNOSIS — N183 Chronic kidney disease, stage 3 (moderate): Secondary | ICD-10-CM | POA: Diagnosis not present

## 2017-11-13 DIAGNOSIS — I480 Paroxysmal atrial fibrillation: Secondary | ICD-10-CM | POA: Diagnosis not present

## 2017-11-13 DIAGNOSIS — I130 Hypertensive heart and chronic kidney disease with heart failure and stage 1 through stage 4 chronic kidney disease, or unspecified chronic kidney disease: Secondary | ICD-10-CM | POA: Diagnosis not present

## 2017-11-13 DIAGNOSIS — E785 Hyperlipidemia, unspecified: Secondary | ICD-10-CM | POA: Diagnosis not present

## 2017-11-16 DIAGNOSIS — I130 Hypertensive heart and chronic kidney disease with heart failure and stage 1 through stage 4 chronic kidney disease, or unspecified chronic kidney disease: Secondary | ICD-10-CM | POA: Diagnosis not present

## 2017-11-16 DIAGNOSIS — I5032 Chronic diastolic (congestive) heart failure: Secondary | ICD-10-CM | POA: Diagnosis not present

## 2017-11-16 DIAGNOSIS — N183 Chronic kidney disease, stage 3 (moderate): Secondary | ICD-10-CM | POA: Diagnosis not present

## 2017-11-16 DIAGNOSIS — I480 Paroxysmal atrial fibrillation: Secondary | ICD-10-CM | POA: Diagnosis not present

## 2017-11-16 DIAGNOSIS — I69318 Other symptoms and signs involving cognitive functions following cerebral infarction: Secondary | ICD-10-CM | POA: Diagnosis not present

## 2017-11-16 DIAGNOSIS — E785 Hyperlipidemia, unspecified: Secondary | ICD-10-CM | POA: Diagnosis not present

## 2017-11-17 DIAGNOSIS — E785 Hyperlipidemia, unspecified: Secondary | ICD-10-CM | POA: Diagnosis not present

## 2017-11-17 DIAGNOSIS — I69318 Other symptoms and signs involving cognitive functions following cerebral infarction: Secondary | ICD-10-CM | POA: Diagnosis not present

## 2017-11-17 DIAGNOSIS — I480 Paroxysmal atrial fibrillation: Secondary | ICD-10-CM | POA: Diagnosis not present

## 2017-11-17 DIAGNOSIS — N183 Chronic kidney disease, stage 3 (moderate): Secondary | ICD-10-CM | POA: Diagnosis not present

## 2017-11-17 DIAGNOSIS — I130 Hypertensive heart and chronic kidney disease with heart failure and stage 1 through stage 4 chronic kidney disease, or unspecified chronic kidney disease: Secondary | ICD-10-CM | POA: Diagnosis not present

## 2017-11-17 DIAGNOSIS — I5032 Chronic diastolic (congestive) heart failure: Secondary | ICD-10-CM | POA: Diagnosis not present

## 2017-11-18 DIAGNOSIS — I130 Hypertensive heart and chronic kidney disease with heart failure and stage 1 through stage 4 chronic kidney disease, or unspecified chronic kidney disease: Secondary | ICD-10-CM | POA: Diagnosis not present

## 2017-11-18 DIAGNOSIS — I69318 Other symptoms and signs involving cognitive functions following cerebral infarction: Secondary | ICD-10-CM | POA: Diagnosis not present

## 2017-11-18 DIAGNOSIS — I5032 Chronic diastolic (congestive) heart failure: Secondary | ICD-10-CM | POA: Diagnosis not present

## 2017-11-18 DIAGNOSIS — E785 Hyperlipidemia, unspecified: Secondary | ICD-10-CM | POA: Diagnosis not present

## 2017-11-18 DIAGNOSIS — N183 Chronic kidney disease, stage 3 (moderate): Secondary | ICD-10-CM | POA: Diagnosis not present

## 2017-11-18 DIAGNOSIS — I480 Paroxysmal atrial fibrillation: Secondary | ICD-10-CM | POA: Diagnosis not present

## 2017-11-19 DIAGNOSIS — E785 Hyperlipidemia, unspecified: Secondary | ICD-10-CM | POA: Diagnosis not present

## 2017-11-19 DIAGNOSIS — I130 Hypertensive heart and chronic kidney disease with heart failure and stage 1 through stage 4 chronic kidney disease, or unspecified chronic kidney disease: Secondary | ICD-10-CM | POA: Diagnosis not present

## 2017-11-19 DIAGNOSIS — I5032 Chronic diastolic (congestive) heart failure: Secondary | ICD-10-CM | POA: Diagnosis not present

## 2017-11-19 DIAGNOSIS — N183 Chronic kidney disease, stage 3 (moderate): Secondary | ICD-10-CM | POA: Diagnosis not present

## 2017-11-19 DIAGNOSIS — I69318 Other symptoms and signs involving cognitive functions following cerebral infarction: Secondary | ICD-10-CM | POA: Diagnosis not present

## 2017-11-19 DIAGNOSIS — I480 Paroxysmal atrial fibrillation: Secondary | ICD-10-CM | POA: Diagnosis not present

## 2017-11-20 DIAGNOSIS — I480 Paroxysmal atrial fibrillation: Secondary | ICD-10-CM | POA: Diagnosis not present

## 2017-11-20 DIAGNOSIS — E785 Hyperlipidemia, unspecified: Secondary | ICD-10-CM | POA: Diagnosis not present

## 2017-11-20 DIAGNOSIS — I130 Hypertensive heart and chronic kidney disease with heart failure and stage 1 through stage 4 chronic kidney disease, or unspecified chronic kidney disease: Secondary | ICD-10-CM | POA: Diagnosis not present

## 2017-11-20 DIAGNOSIS — N183 Chronic kidney disease, stage 3 (moderate): Secondary | ICD-10-CM | POA: Diagnosis not present

## 2017-11-20 DIAGNOSIS — I5032 Chronic diastolic (congestive) heart failure: Secondary | ICD-10-CM | POA: Diagnosis not present

## 2017-11-20 DIAGNOSIS — I69318 Other symptoms and signs involving cognitive functions following cerebral infarction: Secondary | ICD-10-CM | POA: Diagnosis not present

## 2017-11-23 DIAGNOSIS — S41112D Laceration without foreign body of left upper arm, subsequent encounter: Secondary | ICD-10-CM | POA: Diagnosis not present

## 2017-11-23 DIAGNOSIS — G4733 Obstructive sleep apnea (adult) (pediatric): Secondary | ICD-10-CM | POA: Diagnosis not present

## 2017-11-23 DIAGNOSIS — F329 Major depressive disorder, single episode, unspecified: Secondary | ICD-10-CM | POA: Diagnosis not present

## 2017-11-23 DIAGNOSIS — I130 Hypertensive heart and chronic kidney disease with heart failure and stage 1 through stage 4 chronic kidney disease, or unspecified chronic kidney disease: Secondary | ICD-10-CM | POA: Diagnosis not present

## 2017-11-23 DIAGNOSIS — I69318 Other symptoms and signs involving cognitive functions following cerebral infarction: Secondary | ICD-10-CM | POA: Diagnosis not present

## 2017-11-23 DIAGNOSIS — M1991 Primary osteoarthritis, unspecified site: Secondary | ICD-10-CM | POA: Diagnosis not present

## 2017-11-23 DIAGNOSIS — Z9181 History of falling: Secondary | ICD-10-CM | POA: Diagnosis not present

## 2017-11-23 DIAGNOSIS — K219 Gastro-esophageal reflux disease without esophagitis: Secondary | ICD-10-CM | POA: Diagnosis not present

## 2017-11-23 DIAGNOSIS — F0150 Vascular dementia without behavioral disturbance: Secondary | ICD-10-CM | POA: Diagnosis not present

## 2017-11-23 DIAGNOSIS — E785 Hyperlipidemia, unspecified: Secondary | ICD-10-CM | POA: Diagnosis not present

## 2017-11-23 DIAGNOSIS — Z950 Presence of cardiac pacemaker: Secondary | ICD-10-CM | POA: Diagnosis not present

## 2017-11-23 DIAGNOSIS — Q2733 Arteriovenous malformation of digestive system vessel: Secondary | ICD-10-CM | POA: Diagnosis not present

## 2017-11-23 DIAGNOSIS — R451 Restlessness and agitation: Secondary | ICD-10-CM | POA: Diagnosis not present

## 2017-11-23 DIAGNOSIS — I5032 Chronic diastolic (congestive) heart failure: Secondary | ICD-10-CM | POA: Diagnosis not present

## 2017-11-23 DIAGNOSIS — Z9981 Dependence on supplemental oxygen: Secondary | ICD-10-CM | POA: Diagnosis not present

## 2017-11-23 DIAGNOSIS — I442 Atrioventricular block, complete: Secondary | ICD-10-CM | POA: Diagnosis not present

## 2017-11-23 DIAGNOSIS — S3289XD Fracture of other parts of pelvis, subsequent encounter for fracture with routine healing: Secondary | ICD-10-CM | POA: Diagnosis not present

## 2017-11-23 DIAGNOSIS — E119 Type 2 diabetes mellitus without complications: Secondary | ICD-10-CM | POA: Diagnosis not present

## 2017-11-23 DIAGNOSIS — I480 Paroxysmal atrial fibrillation: Secondary | ICD-10-CM | POA: Diagnosis not present

## 2017-11-23 DIAGNOSIS — N183 Chronic kidney disease, stage 3 (moderate): Secondary | ICD-10-CM | POA: Diagnosis not present

## 2017-11-23 DIAGNOSIS — R634 Abnormal weight loss: Secondary | ICD-10-CM | POA: Diagnosis not present

## 2017-11-23 DIAGNOSIS — I69393 Ataxia following cerebral infarction: Secondary | ICD-10-CM | POA: Diagnosis not present

## 2017-11-23 DIAGNOSIS — F419 Anxiety disorder, unspecified: Secondary | ICD-10-CM | POA: Diagnosis not present

## 2017-11-23 DIAGNOSIS — S41111D Laceration without foreign body of right upper arm, subsequent encounter: Secondary | ICD-10-CM | POA: Diagnosis not present

## 2017-11-23 DIAGNOSIS — E039 Hypothyroidism, unspecified: Secondary | ICD-10-CM | POA: Diagnosis not present

## 2017-11-27 DIAGNOSIS — E1140 Type 2 diabetes mellitus with diabetic neuropathy, unspecified: Secondary | ICD-10-CM | POA: Diagnosis not present

## 2017-11-27 DIAGNOSIS — S329XXA Fracture of unspecified parts of lumbosacral spine and pelvis, initial encounter for closed fracture: Secondary | ICD-10-CM

## 2017-11-27 DIAGNOSIS — J9611 Chronic respiratory failure with hypoxia: Secondary | ICD-10-CM

## 2017-11-27 DIAGNOSIS — I5030 Unspecified diastolic (congestive) heart failure: Secondary | ICD-10-CM | POA: Diagnosis not present

## 2017-11-27 DIAGNOSIS — I4891 Unspecified atrial fibrillation: Secondary | ICD-10-CM | POA: Diagnosis not present

## 2017-11-27 DIAGNOSIS — F0150 Vascular dementia without behavioral disturbance: Secondary | ICD-10-CM | POA: Diagnosis not present

## 2017-11-27 DIAGNOSIS — K219 Gastro-esophageal reflux disease without esophagitis: Secondary | ICD-10-CM | POA: Diagnosis not present

## 2017-11-27 DIAGNOSIS — F39 Unspecified mood [affective] disorder: Secondary | ICD-10-CM | POA: Diagnosis not present

## 2017-12-10 DIAGNOSIS — E039 Hypothyroidism, unspecified: Secondary | ICD-10-CM | POA: Diagnosis not present

## 2017-12-14 ENCOUNTER — Emergency Department
Admission: EM | Admit: 2017-12-14 | Discharge: 2017-12-14 | Disposition: A | Discharge status: Skilled Nursing Facility | Attending: Emergency Medicine | Admitting: Emergency Medicine

## 2017-12-14 ENCOUNTER — Emergency Department

## 2017-12-14 ENCOUNTER — Other Ambulatory Visit: Payer: Self-pay

## 2017-12-14 DIAGNOSIS — S12501A Unspecified nondisplaced fracture of sixth cervical vertebra, initial encounter for closed fracture: Secondary | ICD-10-CM | POA: Insufficient documentation

## 2017-12-14 DIAGNOSIS — Y999 Unspecified external cause status: Secondary | ICD-10-CM | POA: Insufficient documentation

## 2017-12-14 DIAGNOSIS — E119 Type 2 diabetes mellitus without complications: Secondary | ICD-10-CM | POA: Diagnosis not present

## 2017-12-14 DIAGNOSIS — Y92128 Other place in nursing home as the place of occurrence of the external cause: Secondary | ICD-10-CM | POA: Insufficient documentation

## 2017-12-14 DIAGNOSIS — F0390 Unspecified dementia without behavioral disturbance: Secondary | ICD-10-CM | POA: Insufficient documentation

## 2017-12-14 DIAGNOSIS — S098XXA Other specified injuries of head, initial encounter: Secondary | ICD-10-CM | POA: Diagnosis not present

## 2017-12-14 DIAGNOSIS — S12591A Other nondisplaced fracture of sixth cervical vertebra, initial encounter for closed fracture: Secondary | ICD-10-CM | POA: Diagnosis not present

## 2017-12-14 DIAGNOSIS — S199XXA Unspecified injury of neck, initial encounter: Secondary | ICD-10-CM | POA: Diagnosis not present

## 2017-12-14 DIAGNOSIS — S0101XA Laceration without foreign body of scalp, initial encounter: Secondary | ICD-10-CM | POA: Insufficient documentation

## 2017-12-14 DIAGNOSIS — N183 Chronic kidney disease, stage 3 (moderate): Secondary | ICD-10-CM | POA: Insufficient documentation

## 2017-12-14 DIAGNOSIS — S12600A Unspecified displaced fracture of seventh cervical vertebra, initial encounter for closed fracture: Secondary | ICD-10-CM | POA: Diagnosis not present

## 2017-12-14 DIAGNOSIS — Y9389 Activity, other specified: Secondary | ICD-10-CM | POA: Insufficient documentation

## 2017-12-14 DIAGNOSIS — W01198A Fall on same level from slipping, tripping and stumbling with subsequent striking against other object, initial encounter: Secondary | ICD-10-CM | POA: Diagnosis not present

## 2017-12-14 DIAGNOSIS — Z7982 Long term (current) use of aspirin: Secondary | ICD-10-CM | POA: Insufficient documentation

## 2017-12-14 DIAGNOSIS — Z87891 Personal history of nicotine dependence: Secondary | ICD-10-CM | POA: Diagnosis not present

## 2017-12-14 DIAGNOSIS — I5042 Chronic combined systolic (congestive) and diastolic (congestive) heart failure: Secondary | ICD-10-CM | POA: Diagnosis not present

## 2017-12-14 DIAGNOSIS — S0990XA Unspecified injury of head, initial encounter: Secondary | ICD-10-CM | POA: Diagnosis not present

## 2017-12-14 DIAGNOSIS — Z79899 Other long term (current) drug therapy: Secondary | ICD-10-CM | POA: Diagnosis not present

## 2017-12-14 DIAGNOSIS — I130 Hypertensive heart and chronic kidney disease with heart failure and stage 1 through stage 4 chronic kidney disease, or unspecified chronic kidney disease: Secondary | ICD-10-CM | POA: Insufficient documentation

## 2017-12-14 DIAGNOSIS — I10 Essential (primary) hypertension: Secondary | ICD-10-CM | POA: Diagnosis not present

## 2017-12-14 MED ORDER — LIDOCAINE HCL (PF) 1 % IJ SOLN
INTRAMUSCULAR | Status: AC
  Filled 2017-12-14: qty 5

## 2017-12-14 NOTE — Discharge Instructions (Addendum)
The staples should be removed from the scalp laceration in 1 week.  The x-ray and CT showed a small fracture to a bone spur on the cervical spine.  This likely does not require any treatment.  Return to the ER for new, worsening, persistent pain, weakness or numbness, or any other new or worsening symptoms that concern you.  We have provided you a referral to follow-up with Dr. Lacinda Axon, from neurosurgery.

## 2017-12-14 NOTE — ED Notes (Signed)
Patient's discharge paperwork reviewed with patient and family and signature obtained from patient's wife.

## 2017-12-14 NOTE — ED Notes (Signed)
Pt presents from [facility] with laceration to back of head. Pt does not walk and awakened out of a deep sleep and tried to walk across the room before he realized he is unable to walk. Pt also has skin tear to his left elbow. Pt has hx of multiple falls in last 2 years. Denies LOC.  Pt alert & oriented x 4 during assessment.

## 2017-12-14 NOTE — ED Provider Notes (Addendum)
[facility] Emergency Department Provider Note ____________________________________________   First MD Initiated Contact with Patient 12/14/17 1609     (approximate)  I have reviewed the triage vital signs and the nursing notes.   HISTORY  Chief Complaint Fall    HPI Richard [name] is a 84 y.o. male with PMH as noted below who presents with head injury, acute onset within the last hour prior to arrival, associated with laceration of the back of his head as well as an injury to the left elbow, and occurring when he fell out of the chair.  Per family members, the patient was asleep in a chair when he suddenly awoke and stood up to walk, and then had a mechanical fall from standing height.  He denies feeling dizzy or lightheaded prior to the fall.  He states that he feels comfortable now.  Family member state that he is at his baseline mental status.  Past Medical History:  Diagnosis Date  . Anxiety   . Arthritis   . Atrial fibrillation ([place])    a. Dx 03/2013, notes report atrial fibrillation/atrial flutter, placed on amiodarone. NSR in subsequent OV's.  . B12 deficiency anemia   . Cancer ([place])   . Chest pain    a. H/o CTA negative for PE 2012, normal cath 2005, normal nucs previously including 05/2012.  Marland Kitchen Complete heart block (HCC)    a. s/p Medtronic Adapta L model ADDRL 1 (serial number [id number]) pacemaker.  . Dementia   . GERD (gastroesophageal reflux disease)   . Hiatal hernia   . Hyperlipidemia   . Hypertension   . Iron deficiency anemia   . LBBB (left bundle branch block)   . Microcytic anemia   . Nephrolithiasis   . On home oxygen therapy    a. 2L w/CPAP at night  . Orthostatic hypotension   . OSA on CPAP    setting = 4  . Pacemaker   . [place] spotted fever ~ 1945  . Sinus drainage   . Stroke ([place]) 05/2016  . Type II diabetes mellitus [facility])     Patient Active Problem List   Diagnosis Date Noted  . Pelvic fracture ([place])  10/02/2017  . Frequent falls 10/02/2017  . SOB (shortness of breath)   . Acute systolic congestive heart failure ([place])   . Encounter for hospice care discussion   . Palliative care encounter   . Goals of care, counseling/discussion   . Lobar pneumonia ([place]) 08/30/2017  . Pulmonary edema 08/28/2017  . Acute on chronic combined systolic and diastolic CHF (congestive heart failure) ([place]) 08/28/2017  . Acute diastolic CHF (congestive heart failure) ([place]) 05/22/2017  . CKD (chronic kidney disease) stage 3, GFR 30-59 ml/min (HCC) 05/22/2017  . Acute respiratory failure with hypoxia ([place]) 05/22/2017  . Intracranial hemorrhage [facility]) summer 2018 03/03/2017  . Head trauma 03/02/2017  . Chronic diastolic CHF (congestive heart failure) ([place]) 11/16/2016  . AVM (arteriovenous malformation) of small bowel, acquired   . Abnormality of gait as late effect of stroke 05/27/2016  . Benign neoplasm of cecum   . Benign neoplasm of sigmoid colon   . GI bleeding 05/22/2016  . Melena   . GIB (gastrointestinal bleeding) 05/21/2016  . Diabetes mellitus with complication ([place])   . Upper GI bleed   . CKD (chronic kidney disease)   . Type 2 diabetes mellitus with circulatory disorder, without long-term current use of insulin ([place])   . Acute right PCA stroke ([place])  05/08/2016  . Ataxia due to recent stroke   . Gait disturbance, post-stroke   . Cerebellar stroke, acute ([place]) 05/07/2016  . Hyperlipidemia   . Chronic anticoagulation   . Cardiac pacemaker in situ   . Acute blood loss anemia   . Cognitive deficits   . Cognitive deficit due to recent stroke   . Stroke-related cognitive dysfunction   . Generalized weakness 05/05/2016  . Dizziness   . Absolute anemia 02/08/2016  . Weakness 02/01/2016  . Gastric polyp 06/21/2015  . Iron deficiency anemia due to chronic blood loss   . GERD (gastroesophageal reflux disease)   . B12 deficiency anemia   . Bradycardia with less than 30 beats per minute 05/31/2014    . Complete heart block ([place]) 05/31/2014  . Syncope 05/31/2014  . Anemia 05/31/2014  . Atrial fibrillation ([place]) 04/08/2013  . Diabetes mellitus ([place]) 04/06/2013  . Hiatal hernia 04/06/2013  . Essential hypertension 06/13/2011  . OSA (obstructive sleep apnea) 06/13/2011    Past Surgical History:  Procedure Laterality Date  . CARDIAC CATHETERIZATION     by Dr. Acie Fredrickson, January 24, 2004, that shows minimal coronary artery irregularities and normal left ventricular function  . CATARACT EXTRACTION W/ INTRAOCULAR LENS  IMPLANT, BILATERAL Bilateral   . COLONOSCOPY WITH PROPOFOL N/A 05/23/2016   Procedure: COLONOSCOPY WITH PROPOFOL;  Surgeon: Jerene Bears, MD;  Location: [place];  Service: Gastroenterology;  Laterality: N/A;  . ENTEROSCOPY N/A 06/02/2016   Procedure: ENTEROSCOPY;  Surgeon: Gatha Mayer, MD;  Location: [facility] ENDOSCOPY;  Service: Endoscopy;  Laterality: N/A;  . ESOPHAGOGASTRODUODENOSCOPY N/A 05/22/2016   Procedure: ESOPHAGOGASTRODUODENOSCOPY (EGD);  Surgeon: Jerene Bears, MD;  Location: [facility] ENDOSCOPY;  Service: Endoscopy;  Laterality: N/A;  . ESOPHAGOGASTRODUODENOSCOPY (EGD) WITH PROPOFOL N/A 06/21/2015   Procedure: ESOPHAGOGASTRODUODENOSCOPY (EGD) WITH PROPOFOL;  Surgeon: Gatha Mayer, MD;  Location: [facility] ENDOSCOPY;  Service: Endoscopy;  Laterality: N/A;  . GIVENS CAPSULE STUDY N/A 05/23/2016   Procedure: GIVENS CAPSULE STUDY;  Surgeon: Jerene Bears, MD;  Location: [place];  Service: Gastroenterology;  Laterality: N/A;  . HOT HEMOSTASIS N/A 06/02/2016   Procedure: HOT HEMOSTASIS (ARGON PLASMA COAGULATION/BICAP);  Surgeon: Gatha Mayer, MD;  Location[facility];  Service: Endoscopy;  Laterality: N/A;  . INGUINAL HERNIA REPAIR Right   . IR RADIOLOGIST EVAL & MGMT  11/25/2016  . IR RADIOLOGIST EVAL & MGMT  01/22/2017  . IR VERTEBROPLASTY CERV/THOR BX INC UNI/BIL INC/INJECT/IMAGING  01/30/2017  . IR VERTEBROPLASTY EA ADDL (T&LS) BX INC UNI/BIL INC INJECT/IMAGING  11/28/2016  . IR  VERTEBROPLASTY EA ADDL (T&LS) BX INC UNI/BIL INC INJECT/IMAGING  01/30/2017  . IR VERTEBROPLASTY LUMBAR BX INC UNI/BIL INC/INJECT/IMAGING  11/28/2016  . LUMBAR DISC SURGERY  ~ 1993  . PERMANENT PACEMAKER INSERTION N/A 06/03/2014   MDT Adapta L implanted by Dr Rayann Heman for syncope and transient AV block  . SKIN CANCER EXCISION     "lower lip" (04/06/2013)  . TEMPORARY PACEMAKER INSERTION N/A 05/31/2014   Procedure: TEMPORARY WIRE;  Surgeon: Sinclair Grooms, MD;  Location: [facility] CATH LAB;  Service: Cardiovascular;  Laterality: N/A;  . VASECTOMY     Hx of     Prior to Admission medications   Medication Sig Start Date End Date Taking? Authorizing Provider  acetaminophen (TYLENOL) 650 MG CR tablet Take 650 mg by mouth 3 (three) times daily.   Yes [provider]  amiodarone (PACERONE) 200 MG tablet Take 200 mg by mouth every Monday, Wednesday, and Friday.  Yes [provider]  aspirin EC 81 MG tablet Take 1 tablet (81 mg total) by mouth daily. 05/26/17  Yes Tat, Shanon Brow, MD  DULoxetine (CYMBALTA) 60 MG capsule Take 60 mg by mouth every evening.   Yes [provider]  ferrous sulfate 325 (65 FE) MG tablet Take 325 mg by mouth 2 (two) times daily with a meal.    Yes [provider]  furosemide (LASIX) 40 MG tablet Take 1 tablet (40 mg total) by mouth 2 (two) times daily. 09/02/17  Yes Tat, Shanon Brow, MD  levothyroxine (SYNTHROID, LEVOTHROID) 50 MCG tablet Take 50 mcg by mouth daily before breakfast.   Yes [provider]  LORazepam (ATIVAN) 0.5 MG tablet Take 1 tablet by mouth at bedtime as needed for anxiety.  11/08/17  Yes [provider]  memantine (NAMENDA) 10 MG tablet Take 1 tablet by mouth 2 (two) times daily. 11/16/17  Yes [provider]  metoprolol succinate (TOPROL-XL) 50 MG 24 hr tablet Take 1 tablet (50 mg total) by mouth daily. Take with or immediately following a meal. 09/03/17  Yes Tat, David, MD  mirtazapine (REMERON) 15 MG tablet Take  7.5 mg by mouth daily. 12/08/17  Yes [provider]  pantoprazole (PROTONIX) 40 MG tablet Take 1 tablet (40 mg total) by mouth daily at 6 (six) AM. 05/25/16  Yes Kelvin Cellar, MD  potassium chloride (K-DUR,KLOR-CON) 10 MEQ tablet Take 2 tablets by mouth daily. 11/02/17  Yes [provider]  QUEtiapine (SEROQUEL) 25 MG tablet Take 1 tablet by mouth 2 (two) times daily. 11/23/17  Yes [provider]  sennosides-docusate sodium (SENOKOT-S) 8.6-50 MG tablet Take 2 tablets by mouth 2 (two) times daily.   Yes [provider]  sertraline (ZOLOFT) 50 MG tablet Take 1 tablet by mouth every morning. 11/06/17  Yes [provider]  sodium chloride (OCEAN) 0.65 % SOLN nasal spray Place 2 sprays into both nostrils 3 (three) times daily as needed for congestion.   Yes [provider]  diazepam (VALIUM) 5 MG tablet Take 0.5 tablets (2.5 mg total) by mouth at bedtime. Patient not taking: Reported on 12/14/2017 05/25/17   Orson Eva, MD  oxyCODONE (OXY IR/ROXICODONE) 5 MG immediate release tablet Take 1 tablet (5 mg total) by mouth every 4 (four) hours as needed for moderate pain. Patient not taking: Reported on 12/14/2017 10/05/17   Isaac Bliss, Rayford Halsted, MD    Allergies Codeine and Citalopram  Family History  Problem Relation Age of Onset  . Thyroid disease Mother        goiter  . Pneumonia Father   . Other Unknown        Parents both died of old age; other conditions not known  . Stroke Brother   . Breast cancer Sister     Social History Social History   Tobacco Use  . Smoking status: Former Smoker    Packs/day: 0.00    Years: 20.00    Pack years: 0.00    Types: Cigarettes  . Smokeless tobacco: Never Used  . Tobacco comment: 04/06/2013 "quit smoking years ago; didn't smoke that much; probably 20 years; 1ppd"  Substance Use Topics  . Alcohol use: No  . Drug use: No    Review of Systems  Constitutional: No fever. Eyes: No visual  changes. ENT: No neck pain. Cardiovascular: Denies chest pain. Respiratory: Denies shortness of breath. Gastrointestinal: No vomiting.  Genitourinary: Negative for flank pain.  Musculoskeletal: Negative for back pain. Skin: Positive  for laceration. Neurological: Positive for mild headache.   ____________________________________________   PHYSICAL EXAM:  VITAL SIGNS: ED Triage Vitals [12/14/17 1551]  Enc Vitals Group     BP 124/74     Pulse Rate 80     Resp 18     Temp 97.6 F (36.4 C)     Temp Source Oral     SpO2 95 %     Weight 186 lb (84.4 kg)     Height 6\' 3"  (1.905 m)     Head Circumference      Peak Flow      Pain Score 4     Pain Loc      Pain Edu?      Excl. in [place]?     Constitutional: Alert, talkative, comfortable appearing. Eyes: Conjunctivae are normal.  EOMI.  PERRLA. Head: 2 cm superficial laceration to occiput. Nose: No congestion/rhinnorhea. Mouth/Throat: Mucous membranes are moist.   Neck: Normal range of motion.  Cervical spine nontender. Cardiovascular:  Good peripheral circulation. Respiratory: Normal respiratory effort.   Gastrointestinal: Soft and nontender.  Genitourinary: No flank tenderness. Musculoskeletal: No lower extremity edema.  Extremities warm and well perfused.  Left elbow with 1 cm superficial skin tear. Neurologic:  Normal speech and language.  Motor intact in all extremities.  Normal coordination.  No gross focal neurologic deficits are appreciated.  Skin:  Skin is warm and dry. No rash noted. Psychiatric: Mood and affect are normal. Speech and behavior are normal.  ____________________________________________   LABS (all labs ordered are listed, but only abnormal results are displayed)  Labs Reviewed - No data to display ____________________________________________  EKG  ED ECG REPORT I, Arta Silence, the attending physician, personally viewed and interpreted this ECG.  Date: 12/14/2017 EKG Time: 1601 Rate:  68 Rhythm: AV dual paced rhythm QRS Axis: normal Intervals: normal ST/T Wave abnormalities: normal Narrative Interpretation: no evidence of acute ischemia  ____________________________________________  RADIOLOGY  CT head: No ICH or other acute findings CT cervical spine: C6-C7 osteophyte nondisplaced fracture  ____________________________________________   PROCEDURES  Procedure(s) performed: Yes  .Marland KitchenLaceration Repair Date/Time: 12/14/2017 8:14 PM Performed by: Arta Silence, MD Authorized by: Arta Silence, MD   Consent:    Consent obtained:  Verbal Anesthesia (see MAR for exact dosages):    Anesthesia method:  Local infiltration   Local anesthetic:  Lidocaine 1% w/o epi Laceration details:    Location:  Scalp   Scalp location:  Occipital   Length (cm):  2 Repair type:    Repair type:  Simple Treatment:    Amount of cleaning:  Standard Skin repair:    Repair method:  Staples   Number of staples:  4 Approximation:    Approximation:  Close Post-procedure details:    Patient tolerance of procedure:  Tolerated well, no immediate complications    Critical Care performed: No ____________________________________________   INITIAL IMPRESSION / ASSESSMENT AND PLAN / ED COURSE  Pertinent labs & imaging results that were available during my care of the patient were reviewed by me and considered in my medical decision making (see chart for details).  84 year old male with PMH as noted above presents with occipital scalp laceration after a fall when he quickly got up out of the chair, which appears to be mechanical from his description of the description of family members.  Patient had no LOC, and is at his baseline mental status currently.  Vital signs are normal, the patient is comfortable appearing, and the exam is as  described above.  There is a superficial skin tear to the left elbow which does not require repair.  Patient's family member state that his  tetanus is up-to-date.  Plan: CT head, CT C-spine, lack repair, and discharge if imaging negative.  There is no indication for medical work-up.    ----------------------------------------- 8:15 PM on 12/14/2017 -----------------------------------------  CT head is negative.  CT C-spine showed fracture of an osteophyte on C6-C7.  I consulted Dr. Lacinda Axon from neurosurgery over the phone who reviewed the images.  He advised that a flexion-extension film could be obtained and if this was negative for instability, the patient will be safe for discharge home.  Otherwise he would recommend cervical collar and follow-up with neurosurgery.  Flexion-extension x-rays were obtained, but were limited in the lower C-spine and did not visualize the area of the osteophyte.  I discussed this with the patient and his family members including one who is an Therapist, sports.  I offered the option of cervical collar although the patient states that he did not want to wear one and his family stated that he would not comply.  I advised him that the risk of injury due to this type of osteoporotic fracture was low, however could not be completely ruled out.  At this time the patient has no cervical pain, and no neurological symptoms.  I instructed them to follow-up with Dr. Lacinda Axon from neurosurgery especially if he does not want to wear the cervical collar.  I advised him about the risk of possible spinal injury, and symptoms watch out for.  Return precautions were given, the patient and family members expressed understanding.  ____________________________________________   FINAL CLINICAL IMPRESSION(S) / ED DIAGNOSES  Final diagnoses:  Laceration of scalp, initial encounter  Injury of head, initial encounter  Other closed nondisplaced fracture of sixth cervical vertebra, initial encounter ([place])      NEW MEDICATIONS STARTED DURING THIS VISIT:  New Prescriptions   No medications on file     Note:  This document was prepared  using Dragon voice recognition software and may include unintentional dictation errors.    Arta Silence, MD 12/14/17 2016    Arta Silence, MD 12/21/17 1451

## 2017-12-14 NOTE — ED Notes (Addendum)
Horris Latino from hospice called; state she is their patient and wanted to let us know for billing purposes. PT has pulmonary htn and chf.

## 2017-12-14 NOTE — ED Triage Notes (Addendum)
Pt arrived from [facility] via EMS with complaints of fall. Pt was asleep when he stood up to walk and stumbled and then fell. Pt denies LOC. Pt has a 2 inch laceration to back of head, the bleeding is controlled. Pt also has a laceration to left elbow which is bandaged. Pt is a high fall risk. VS per EMS BP-158/86 HR-83 Medication list is at the bedside. DNR at the bedside. Family at the bedside. Pt has a Hx of Dementia. Oriented to self but not always place. Pt is normally on 4L oxygen at facility.

## 2017-12-22 DIAGNOSIS — I5033 Acute on chronic diastolic (congestive) heart failure: Secondary | ICD-10-CM | POA: Diagnosis not present

## 2017-12-23 DIAGNOSIS — N183 Chronic kidney disease, stage 3 (moderate): Secondary | ICD-10-CM | POA: Diagnosis not present

## 2017-12-23 DIAGNOSIS — E039 Hypothyroidism, unspecified: Secondary | ICD-10-CM | POA: Diagnosis not present

## 2017-12-23 DIAGNOSIS — Z8781 Personal history of (healed) traumatic fracture: Secondary | ICD-10-CM | POA: Diagnosis not present

## 2017-12-23 DIAGNOSIS — I480 Paroxysmal atrial fibrillation: Secondary | ICD-10-CM | POA: Diagnosis not present

## 2017-12-23 DIAGNOSIS — I69318 Other symptoms and signs involving cognitive functions following cerebral infarction: Secondary | ICD-10-CM | POA: Diagnosis not present

## 2017-12-23 DIAGNOSIS — I442 Atrioventricular block, complete: Secondary | ICD-10-CM | POA: Diagnosis not present

## 2017-12-23 DIAGNOSIS — Z9981 Dependence on supplemental oxygen: Secondary | ICD-10-CM | POA: Diagnosis not present

## 2017-12-23 DIAGNOSIS — E1122 Type 2 diabetes mellitus with diabetic chronic kidney disease: Secondary | ICD-10-CM | POA: Diagnosis not present

## 2017-12-23 DIAGNOSIS — Z9181 History of falling: Secondary | ICD-10-CM | POA: Diagnosis not present

## 2017-12-23 DIAGNOSIS — G4733 Obstructive sleep apnea (adult) (pediatric): Secondary | ICD-10-CM | POA: Diagnosis not present

## 2017-12-23 DIAGNOSIS — S41112D Laceration without foreign body of left upper arm, subsequent encounter: Secondary | ICD-10-CM | POA: Diagnosis not present

## 2017-12-23 DIAGNOSIS — I5032 Chronic diastolic (congestive) heart failure: Secondary | ICD-10-CM | POA: Diagnosis not present

## 2017-12-23 DIAGNOSIS — I130 Hypertensive heart and chronic kidney disease with heart failure and stage 1 through stage 4 chronic kidney disease, or unspecified chronic kidney disease: Secondary | ICD-10-CM | POA: Diagnosis not present

## 2017-12-23 DIAGNOSIS — K219 Gastro-esophageal reflux disease without esophagitis: Secondary | ICD-10-CM | POA: Diagnosis not present

## 2017-12-23 DIAGNOSIS — F419 Anxiety disorder, unspecified: Secondary | ICD-10-CM | POA: Diagnosis not present

## 2017-12-23 DIAGNOSIS — R0602 Shortness of breath: Secondary | ICD-10-CM | POA: Diagnosis not present

## 2017-12-23 DIAGNOSIS — F0150 Vascular dementia without behavioral disturbance: Secondary | ICD-10-CM | POA: Diagnosis not present

## 2017-12-23 DIAGNOSIS — F329 Major depressive disorder, single episode, unspecified: Secondary | ICD-10-CM | POA: Diagnosis not present

## 2017-12-23 DIAGNOSIS — Z950 Presence of cardiac pacemaker: Secondary | ICD-10-CM | POA: Diagnosis not present

## 2017-12-23 DIAGNOSIS — I69393 Ataxia following cerebral infarction: Secondary | ICD-10-CM | POA: Diagnosis not present

## 2017-12-23 DIAGNOSIS — Q2733 Arteriovenous malformation of digestive system vessel: Secondary | ICD-10-CM | POA: Diagnosis not present

## 2017-12-23 DIAGNOSIS — E785 Hyperlipidemia, unspecified: Secondary | ICD-10-CM | POA: Diagnosis not present

## 2017-12-23 DIAGNOSIS — R634 Abnormal weight loss: Secondary | ICD-10-CM | POA: Diagnosis not present

## 2017-12-23 DIAGNOSIS — S41111D Laceration without foreign body of right upper arm, subsequent encounter: Secondary | ICD-10-CM | POA: Diagnosis not present

## 2017-12-23 DIAGNOSIS — R0989 Other specified symptoms and signs involving the circulatory and respiratory systems: Secondary | ICD-10-CM | POA: Diagnosis not present

## 2017-12-23 DIAGNOSIS — M1991 Primary osteoarthritis, unspecified site: Secondary | ICD-10-CM | POA: Diagnosis not present

## 2017-12-24 DIAGNOSIS — J181 Lobar pneumonia, unspecified organism: Secondary | ICD-10-CM | POA: Diagnosis not present

## 2017-12-25 ENCOUNTER — Encounter: Payer: Self-pay | Admitting: *Deleted

## 2017-12-25 ENCOUNTER — Emergency Department
Admission: EM | Admit: 2017-12-25 | Discharge: 2017-12-25 | Disposition: A | Discharge status: Home / Self Care | Attending: Emergency Medicine | Admitting: Emergency Medicine

## 2017-12-25 ENCOUNTER — Emergency Department

## 2017-12-25 ENCOUNTER — Other Ambulatory Visit: Payer: Self-pay

## 2017-12-25 DIAGNOSIS — S51012A Laceration without foreign body of left elbow, initial encounter: Secondary | ICD-10-CM | POA: Diagnosis not present

## 2017-12-25 DIAGNOSIS — F0390 Unspecified dementia without behavioral disturbance: Secondary | ICD-10-CM | POA: Diagnosis not present

## 2017-12-25 DIAGNOSIS — I130 Hypertensive heart and chronic kidney disease with heart failure and stage 1 through stage 4 chronic kidney disease, or unspecified chronic kidney disease: Secondary | ICD-10-CM | POA: Insufficient documentation

## 2017-12-25 DIAGNOSIS — I5042 Chronic combined systolic (congestive) and diastolic (congestive) heart failure: Secondary | ICD-10-CM | POA: Insufficient documentation

## 2017-12-25 DIAGNOSIS — W19XXXA Unspecified fall, initial encounter: Secondary | ICD-10-CM | POA: Insufficient documentation

## 2017-12-25 DIAGNOSIS — Z9911 Dependence on respirator [ventilator] status: Secondary | ICD-10-CM | POA: Diagnosis not present

## 2017-12-25 DIAGNOSIS — Y999 Unspecified external cause status: Secondary | ICD-10-CM | POA: Insufficient documentation

## 2017-12-25 DIAGNOSIS — E1122 Type 2 diabetes mellitus with diabetic chronic kidney disease: Secondary | ICD-10-CM | POA: Diagnosis not present

## 2017-12-25 DIAGNOSIS — N183 Chronic kidney disease, stage 3 (moderate): Secondary | ICD-10-CM | POA: Insufficient documentation

## 2017-12-25 DIAGNOSIS — R0600 Dyspnea, unspecified: Secondary | ICD-10-CM | POA: Diagnosis not present

## 2017-12-25 DIAGNOSIS — S0101XA Laceration without foreign body of scalp, initial encounter: Secondary | ICD-10-CM | POA: Insufficient documentation

## 2017-12-25 DIAGNOSIS — Y939 Activity, unspecified: Secondary | ICD-10-CM | POA: Insufficient documentation

## 2017-12-25 DIAGNOSIS — Y92129 Unspecified place in nursing home as the place of occurrence of the external cause: Secondary | ICD-10-CM | POA: Insufficient documentation

## 2017-12-25 DIAGNOSIS — Z87891 Personal history of nicotine dependence: Secondary | ICD-10-CM | POA: Diagnosis not present

## 2017-12-25 DIAGNOSIS — Z7401 Bed confinement status: Secondary | ICD-10-CM | POA: Diagnosis not present

## 2017-12-25 DIAGNOSIS — Z950 Presence of cardiac pacemaker: Secondary | ICD-10-CM | POA: Insufficient documentation

## 2017-12-25 DIAGNOSIS — S0990XA Unspecified injury of head, initial encounter: Secondary | ICD-10-CM | POA: Diagnosis not present

## 2017-12-25 MED ORDER — MUPIROCIN 2 % EX OINT: TOPICAL_OINTMENT | CUTANEOUS | 0 refills | Status: AC

## 2017-12-25 MED ORDER — LIDOCAINE-EPINEPHRINE 1 %-1:100000 IJ SOLN
10.0000 mL | Freq: Once | INTRAMUSCULAR | Status: AC
  Administered 2017-12-25: 10 mL via INTRADERMAL
  Filled 2017-12-25: qty 10

## 2017-12-25 NOTE — ED Notes (Signed)
EMS called for transportation. Family updated.

## 2017-12-25 NOTE — ED Notes (Addendum)
Family reports to RN that pt was not sent home with DNR last week. RN called nurse that cared for pt last visit in regard to missing DNR. No answer at this time. Message left and RN will call again as soon as possible.

## 2017-12-25 NOTE — ED Triage Notes (Signed)
Pt to ED after a reportedly unwitnessed fall at [facility] this afternoon. Pt is a high fall risk that was left alone and when sitter returned to room pt was on the floor. Pt was seen a couple weeks ago for similar event. Pt has laceration noted to left side of head and laceration noted to left elbow. Pt had wound on left elbow from previous fall and it appear to have reopened. Bleeding under control. No neuro deficits noted at this time.

## 2017-12-25 NOTE — ED Notes (Addendum)
Transportation arrived to ED. Family updated and pt ready for transport. Elbow wrapped and home care reviewed with family. Pt dressed and changed. Pt denies needing to urinate and no BM at this time. Pt remains in NAD. Report called to [facility], Therapist, sports. No DNR found and family aware.

## 2017-12-25 NOTE — ED Provider Notes (Signed)
[facility] Emergency Department Provider Note       Time seen: ----------------------------------------- 4:20 PM on 12/25/2017 -----------------------------------------   I have reviewed the triage vital signs and the nursing notes.  HISTORY   Chief Complaint No chief complaint on file.    HPI DEIONTAE Richard [name] is a 84 y.o. male with a history of anxiety, arthritis, A. fib, cancer, chest pain, hypertension, hyperlipidemia, anemia, and diabetes who presents to the ED for a fall.  Patient is a very high fall risk and is at [facility].  According to the report his sitter left the room and he got up and fell.  He recently was seen for same, sustained a scalp and left elbow laceration during the fall today.  He denies any pain or other complaints.  Past Medical History:  Diagnosis Date  . Anxiety   . Arthritis   . Atrial fibrillation ([place])    a. Dx 03/2013, notes report atrial fibrillation/atrial flutter, placed on amiodarone. NSR in subsequent OV's.  . B12 deficiency anemia   . Cancer ([place])   . Chest pain    a. H/o CTA negative for PE 2012, normal cath 2005, normal nucs previously including 05/2012.  Marland Kitchen Complete heart block (HCC)    a. s/p Medtronic Adapta L model ADDRL 1 (serial number [id number]) pacemaker.  . Dementia   . GERD (gastroesophageal reflux disease)   . Hiatal hernia   . Hyperlipidemia   . Hypertension   . Iron deficiency anemia   . LBBB (left bundle branch block)   . Microcytic anemia   . Nephrolithiasis   . On home oxygen therapy    a. 2L w/CPAP at night  . Orthostatic hypotension   . OSA on CPAP    setting = 4  . Pacemaker   . [place] spotted fever ~ 1945  . Sinus drainage   . Stroke ([place]) 05/2016  . Type II diabetes mellitus [facility])     Patient Active Problem List   Diagnosis Date Noted  . Pelvic fracture ([place]) 10/02/2017  . Frequent falls 10/02/2017  . SOB (shortness of breath)   . Acute systolic congestive heart  failure ([place])   . Encounter for hospice care discussion   . Palliative care encounter   . Goals of care, counseling/discussion   . Lobar pneumonia ([place]) 08/30/2017  . Pulmonary edema 08/28/2017  . Acute on chronic combined systolic and diastolic CHF (congestive heart failure) ([place]) 08/28/2017  . Acute diastolic CHF (congestive heart failure) ([place]) 05/22/2017  . CKD (chronic kidney disease) stage 3, GFR 30-59 ml/min (HCC) 05/22/2017  . Acute respiratory failure with hypoxia ([place]) 05/22/2017  . Intracranial hemorrhage [facility]) summer 2018 03/03/2017  . Head trauma 03/02/2017  . Chronic diastolic CHF (congestive heart failure) ([place]) 11/16/2016  . AVM (arteriovenous malformation) of small bowel, acquired   . Abnormality of gait as late effect of stroke 05/27/2016  . Benign neoplasm of cecum   . Benign neoplasm of sigmoid colon   . GI bleeding 05/22/2016  . Melena   . GIB (gastrointestinal bleeding) 05/21/2016  . Diabetes mellitus with complication ([place])   . Upper GI bleed   . CKD (chronic kidney disease)   . Type 2 diabetes mellitus with circulatory disorder, without long-term current use of insulin ([place])   . Acute right PCA stroke ([place]) 05/08/2016  . Ataxia due to recent stroke   . Gait disturbance, post-stroke   . Cerebellar stroke, acute ([place]) 05/07/2016  . Hyperlipidemia   .  Chronic anticoagulation   . Cardiac pacemaker in situ   . Acute blood loss anemia   . Cognitive deficits   . Cognitive deficit due to recent stroke   . Stroke-related cognitive dysfunction   . Generalized weakness 05/05/2016  . Dizziness   . Absolute anemia 02/08/2016  . Weakness 02/01/2016  . Gastric polyp 06/21/2015  . Iron deficiency anemia due to chronic blood loss   . GERD (gastroesophageal reflux disease)   . B12 deficiency anemia   . Bradycardia with less than 30 beats per minute 05/31/2014  . Complete heart block ([place]) 05/31/2014  . Syncope 05/31/2014  . Anemia 05/31/2014  . Atrial fibrillation  ([place]) 04/08/2013  . Diabetes mellitus ([place]) 04/06/2013  . Hiatal hernia 04/06/2013  . Essential hypertension 06/13/2011  . OSA (obstructive sleep apnea) 06/13/2011    Past Surgical History:  Procedure Laterality Date  . CARDIAC CATHETERIZATION     by Dr. Acie Fredrickson, January 24, 2004, that shows minimal coronary artery irregularities and normal left ventricular function  . CATARACT EXTRACTION W/ INTRAOCULAR LENS  IMPLANT, BILATERAL Bilateral   . COLONOSCOPY WITH PROPOFOL N/A 05/23/2016   Procedure: COLONOSCOPY WITH PROPOFOL;  Surgeon: Jerene Bears, MD;  Location: [place];  Service: Gastroenterology;  Laterality: N/A;  . ENTEROSCOPY N/A 06/02/2016   Procedure: ENTEROSCOPY;  Surgeon: Gatha Mayer, MD;  Location: [facility] ENDOSCOPY;  Service: Endoscopy;  Laterality: N/A;  . ESOPHAGOGASTRODUODENOSCOPY N/A 05/22/2016   Procedure: ESOPHAGOGASTRODUODENOSCOPY (EGD);  Surgeon: Jerene Bears, MD;  Location: [facility] ENDOSCOPY;  Service: Endoscopy;  Laterality: N/A;  . ESOPHAGOGASTRODUODENOSCOPY (EGD) WITH PROPOFOL N/A 06/21/2015   Procedure: ESOPHAGOGASTRODUODENOSCOPY (EGD) WITH PROPOFOL;  Surgeon: Gatha Mayer, MD;  Location: [facility] ENDOSCOPY;  Service: Endoscopy;  Laterality: N/A;  . GIVENS CAPSULE STUDY N/A 05/23/2016   Procedure: GIVENS CAPSULE STUDY;  Surgeon: Jerene Bears, MD;  Location: [place];  Service: Gastroenterology;  Laterality: N/A;  . HOT HEMOSTASIS N/A 06/02/2016   Procedure: HOT HEMOSTASIS (ARGON PLASMA COAGULATION/BICAP);  Surgeon: Gatha Mayer, MD;  Location[facility];  Service: Endoscopy;  Laterality: N/A;  . INGUINAL HERNIA REPAIR Right   . IR RADIOLOGIST EVAL & MGMT  11/25/2016  . IR RADIOLOGIST EVAL & MGMT  01/22/2017  . IR VERTEBROPLASTY CERV/THOR BX INC UNI/BIL INC/INJECT/IMAGING  01/30/2017  . IR VERTEBROPLASTY EA ADDL (T&LS) BX INC UNI/BIL INC INJECT/IMAGING  11/28/2016  . IR VERTEBROPLASTY EA ADDL (T&LS) BX INC UNI/BIL INC INJECT/IMAGING  01/30/2017  . IR VERTEBROPLASTY LUMBAR BX INC  UNI/BIL INC/INJECT/IMAGING  11/28/2016  . LUMBAR DISC SURGERY  ~ 1993  . PERMANENT PACEMAKER INSERTION N/A 06/03/2014   MDT Adapta L implanted by Dr Rayann Heman for syncope and transient AV block  . SKIN CANCER EXCISION     "lower lip" (04/06/2013)  . TEMPORARY PACEMAKER INSERTION N/A 05/31/2014   Procedure: TEMPORARY WIRE;  Surgeon: Sinclair Grooms, MD;  Location: [facility] CATH LAB;  Service: Cardiovascular;  Laterality: N/A;  . VASECTOMY     Hx of     Allergies Codeine and Citalopram  Social History Social History   Tobacco Use  . Smoking status: Former Smoker    Packs/day: 0.00    Years: 20.00    Pack years: 0.00    Types: Cigarettes  . Smokeless tobacco: Never Used  . Tobacco comment: 04/06/2013 "quit smoking years ago; didn't smoke that much; probably 20 years; 1ppd"  Substance Use Topics  . Alcohol use: No  . Drug use: No   Review of Systems  Constitutional: Negative for fever. Cardiovascular: Negative for chest pain. Respiratory: Negative for shortness of breath. Gastrointestinal: Negative for abdominal pain, vomiting and diarrhea. Musculoskeletal: Negative for back pain. Skin: Positive for left elbow and left side occipital scalp lacerations Neurological: Negative for headaches, focal weakness or numbness.  All systems negative/normal/unremarkable except as stated in the HPI  ____________________________________________   PHYSICAL EXAM:  VITAL SIGNS: ED Triage Vitals  Enc Vitals Group     BP      Pulse      Resp      Temp      Temp src      SpO2      Weight      Height      Head Circumference      Peak Flow      Pain Score      Pain Loc      Pain Edu?      Excl. in [place]?    Constitutional: Alert,  Well appearing and in no distress. Eyes: Conjunctivae are normal. Normal extraocular movements. ENT   Head: Normocephalic, left-sided occipital scalp laceration   Nose: No congestion/rhinnorhea.   Mouth/Throat: Mucous membranes are moist.   Neck: No  stridor. Cardiovascular: Normal rate, regular rhythm.  Respiratory: Normal respiratory effort without tachypnea nor retractions. Breath sounds are clear and equal bilaterally. No wheezes/rales/rhonchi. Gastrointestinal: Soft and nontender. Normal bowel sounds Musculoskeletal No lower extremity tenderness nor edema. Neurologic:  Normal speech and language. No gross focal neurologic deficits are appreciated.  Skin: 6 cm left elbow laceration and 2.5 cm left-sided occipital scalp laceration Psychiatric: Mood and affect are normal. Speech and behavior are normal.  ____________________________________________  ED COURSE:  As part of my medical decision making, I reviewed the following data within the [place] History obtained from family if available, nursing notes, old chart and ekg, as well as notes from prior ED visits. Patient presented for head injury and extremity laceration, we will assess with labs and imaging as indicated at this time.   Marland Kitchen.Laceration Repair Date/Time: 12/25/2017 6:33 PM Performed by: Earleen Newport, MD Authorized by: Earleen Newport, MD   Consent:    Consent obtained:  Verbal   Consent given by:  Patient   Risks discussed:  Infection, pain, retained foreign body, poor cosmetic result and poor wound healing Anesthesia (see MAR for exact dosages):    Anesthesia method:  Local infiltration   Local anesthetic:  Lidocaine 1% WITH epi Laceration details:    Location:  Scalp   Scalp location:  Occipital   Length (cm):  2.5   Depth (mm):  5 Repair type:    Repair type:  Simple Exploration:    Hemostasis achieved with:  Direct pressure   Wound exploration: entire depth of wound probed and visualized     Contaminated: no   Treatment:    Area cleansed with:  Saline   Amount of cleaning:  Extensive   Irrigation solution:  Sterile saline   Visualized foreign bodies/material removed: no   Skin repair:    Repair method:  Staples   Number  of staples:  5 Approximation:    Approximation:  Close Post-procedure details:    Dressing:  Sterile dressing   Patient tolerance of procedure:  Tolerated well, no immediate complications .Marland KitchenLaceration Repair Date/Time: 12/25/2017 6:34 PM Performed by: Earleen Newport, MD Authorized by: Earleen Newport, MD   Consent:    Consent obtained:  Verbal   Consent given by:  Patient   Risks discussed:  Infection, pain, retained foreign body, poor cosmetic result and poor wound healing Anesthesia (see MAR for exact dosages):    Anesthesia method:  Local infiltration   Local anesthetic:  Lidocaine 1% WITH epi Laceration details:    Location:  Shoulder/arm   Shoulder/arm location:  L elbow   Length (cm):  6   Depth (mm):  10 Repair type:    Repair type:  Simple Exploration:    Hemostasis achieved with:  Direct pressure   Wound exploration: entire depth of wound probed and visualized     Contaminated: no   Treatment:    Area cleansed with:  Saline   Amount of cleaning:  Extensive   Irrigation solution:  Sterile saline   Visualized foreign bodies/material removed: no   Skin repair:    Repair method:  Staples   Number of staples:  8 Approximation:    Approximation:  Close Post-procedure details:    Dressing:  Sterile dressing   Patient tolerance of procedure:  Tolerated well, no immediate complications   ____________________________________________   RADIOLOGY  CT head Did not reveal any acute process ____________________________________________  DIFFERENTIAL DIAGNOSIS   Fall, head injury, scalp laceration, elbow laceration, subdural hematoma, intracranial hemorrhage  FINAL ASSESSMENT AND PLAN  Fall, head injury, laceration   Plan: The patient had presented for fall at the nursing home. Patient's imaging did not reveal any acute process.  Wounds were closed as dictated above.  I will prescribe a topical antibiotic ointment, otherwise staples need to be removed in  10 to 14 days.   Laurence Aly, MD   Note: This note was generated in part or whole with voice recognition software. Voice recognition is usually quite accurate but there are transcription errors that can and very often do occur. I apologize for any typographical errors that were not detected and corrected.     Earleen Newport, MD 12/25/17 [phone]

## 2017-12-25 NOTE — ED Notes (Signed)
RN called pharmacy requesting lidocaine.

## 2017-12-25 NOTE — ED Notes (Signed)
[facility] called for transport to [facility]

## 2017-12-28 DIAGNOSIS — S51021A Laceration with foreign body of right elbow, initial encounter: Secondary | ICD-10-CM | POA: Diagnosis not present

## 2017-12-28 DIAGNOSIS — S0101XA Laceration without foreign body of scalp, initial encounter: Secondary | ICD-10-CM | POA: Diagnosis not present

## 2017-12-28 DIAGNOSIS — Y92120 Kitchen in nursing home as the place of occurrence of the external cause: Secondary | ICD-10-CM | POA: Diagnosis not present

## 2018-01-01 DIAGNOSIS — E039 Hypothyroidism, unspecified: Secondary | ICD-10-CM | POA: Diagnosis not present

## 2018-01-01 DIAGNOSIS — I4891 Unspecified atrial fibrillation: Secondary | ICD-10-CM | POA: Diagnosis not present

## 2018-01-01 DIAGNOSIS — S329XXA Fracture of unspecified parts of lumbosacral spine and pelvis, initial encounter for closed fracture: Secondary | ICD-10-CM

## 2018-01-01 DIAGNOSIS — K219 Gastro-esophageal reflux disease without esophagitis: Secondary | ICD-10-CM

## 2018-01-01 DIAGNOSIS — F39 Unspecified mood [affective] disorder: Secondary | ICD-10-CM

## 2018-01-01 DIAGNOSIS — E119 Type 2 diabetes mellitus without complications: Secondary | ICD-10-CM | POA: Diagnosis not present

## 2018-01-01 DIAGNOSIS — J9611 Chronic respiratory failure with hypoxia: Secondary | ICD-10-CM

## 2018-01-01 DIAGNOSIS — F0150 Vascular dementia without behavioral disturbance: Secondary | ICD-10-CM

## 2018-01-01 DIAGNOSIS — I5030 Unspecified diastolic (congestive) heart failure: Secondary | ICD-10-CM | POA: Diagnosis not present

## 2018-01-13 ENCOUNTER — Ambulatory Visit (INDEPENDENT_AMBULATORY_CARE_PROVIDER_SITE_OTHER): Admitting: *Deleted

## 2018-01-13 DIAGNOSIS — I5022 Chronic systolic (congestive) heart failure: Secondary | ICD-10-CM

## 2018-01-13 DIAGNOSIS — I442 Atrioventricular block, complete: Secondary | ICD-10-CM

## 2018-01-13 NOTE — Progress Notes (Signed)
Remote pacemaker transmission.   

## 2018-01-19 IMAGING — CT CT HEAD W/O CM
3 series · 14 of 47 positions shown, 16 images · non-contrast
Comparison: Head CT 05/05/2016

CLINICAL DATA: Headache.  Prior stroke

EXAM:
CT HEAD WITHOUT CONTRAST
TECHNIQUE: Contiguous axial images were obtained from the base of the skull
through the vertex without intravenous contrast.

[Series 2: head 5.0 h30s · axial · 0.53mm/px · z∈[-119,+26]mm · 8 of 35 slices shown, 10 images]
[im 3/35  brain]
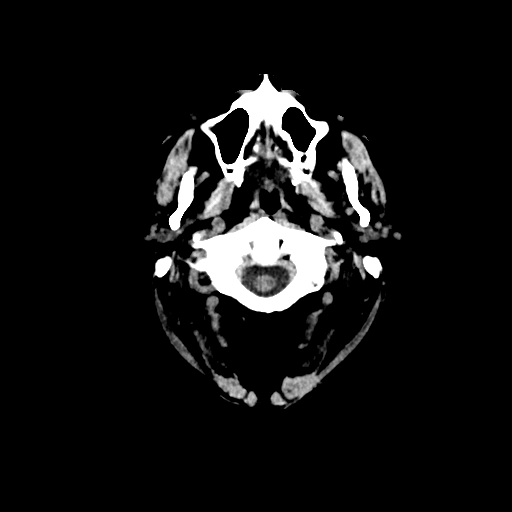
[im 3/35  bone]
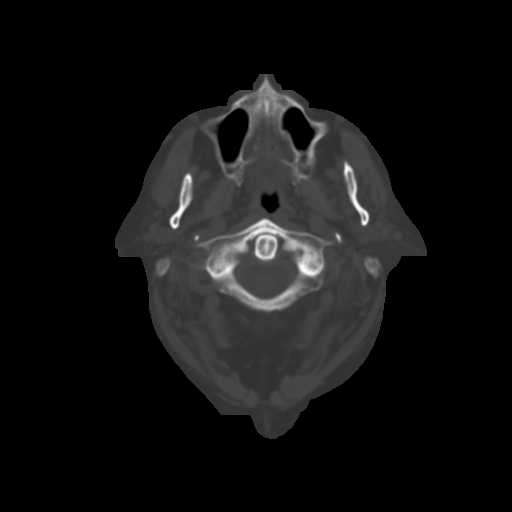
[im 8/35  brain]
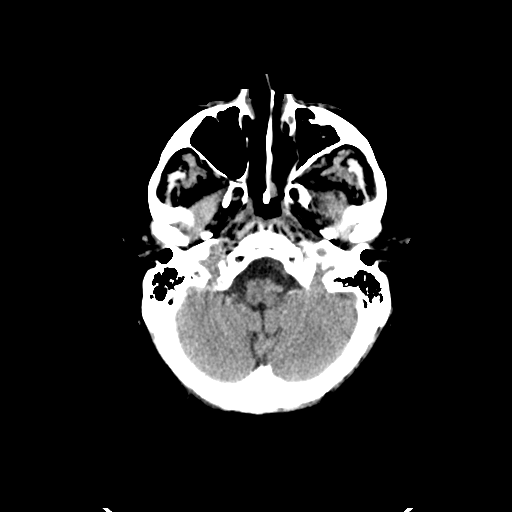
[im 11/35  brain]
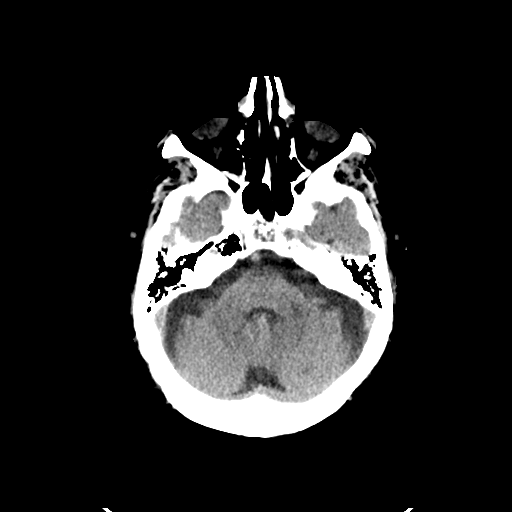
[im 16/35  brain]
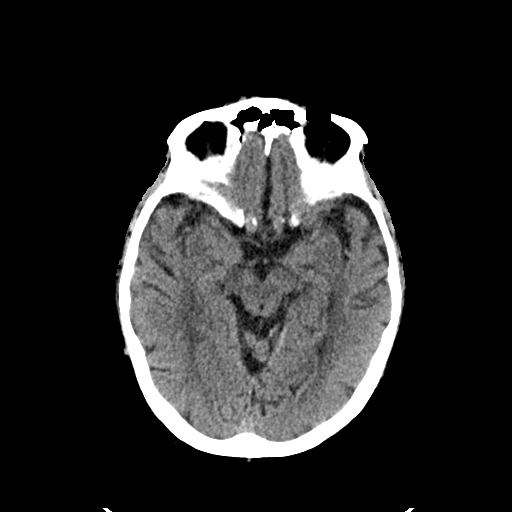
[im 19/35  brain]
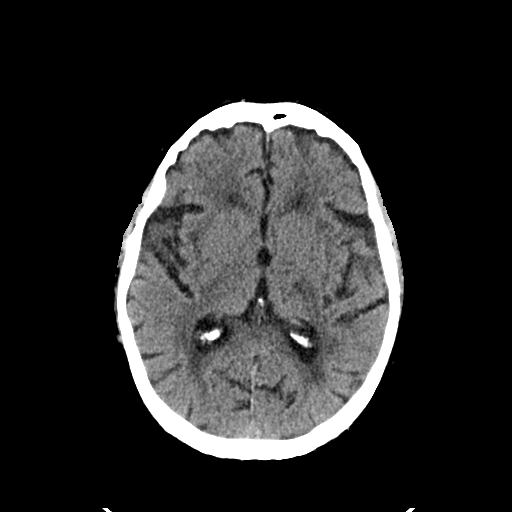
[im 19/35  bone]
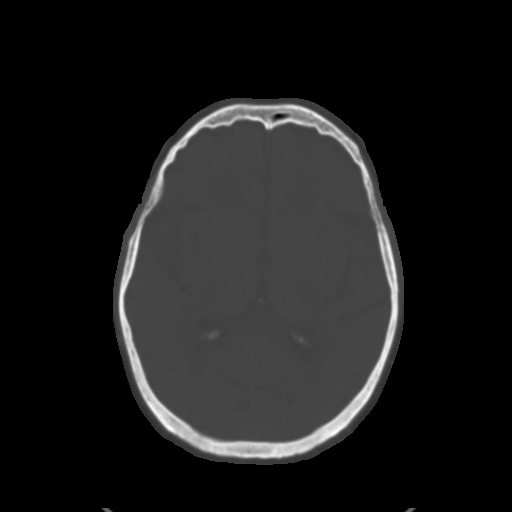
[im 24/35  brain]
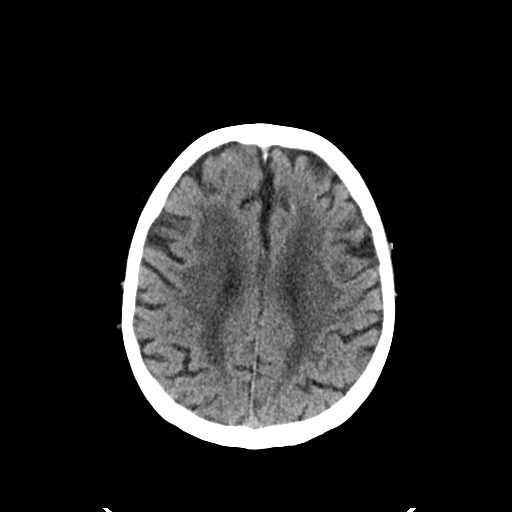
[im 27/35  brain]
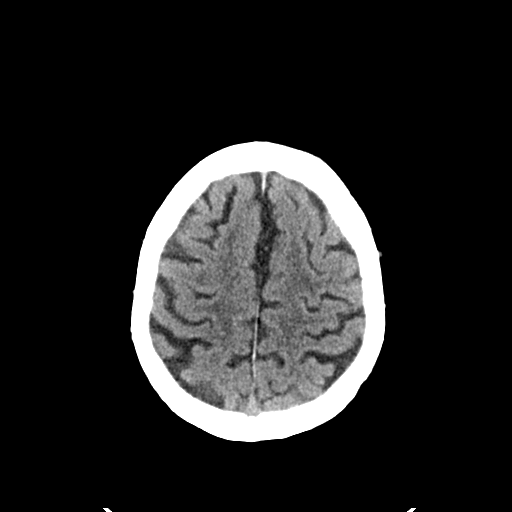
[im 32/35  brain]
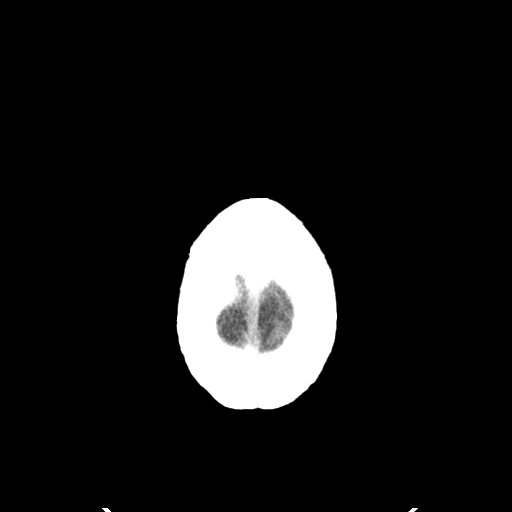

[Series 4: head 3.0 mpr · coronal · 0.34mm/px · 3 of 75 slices shown (1 of 2)]
[im 27/75  brain]
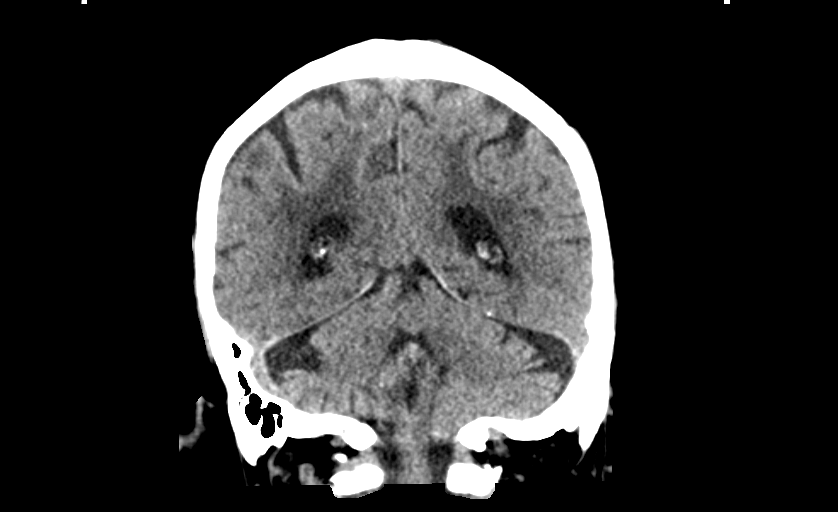
[im 34/75  brain]
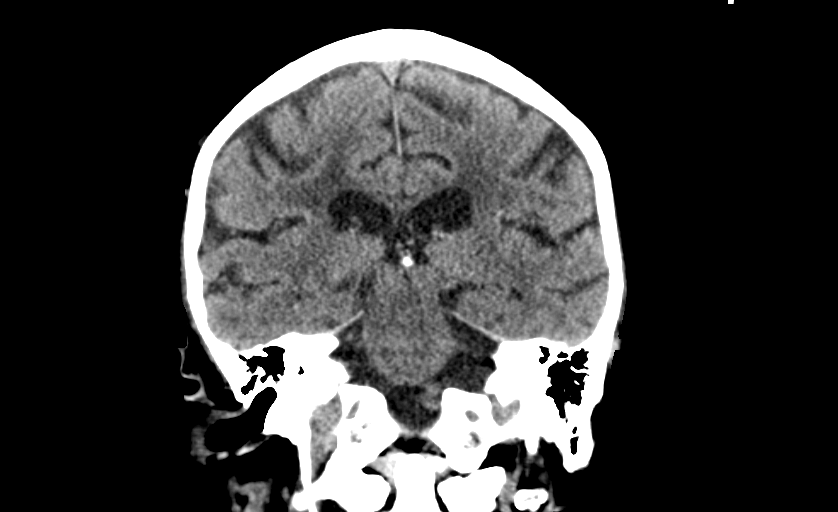
[im 41/75  brain]
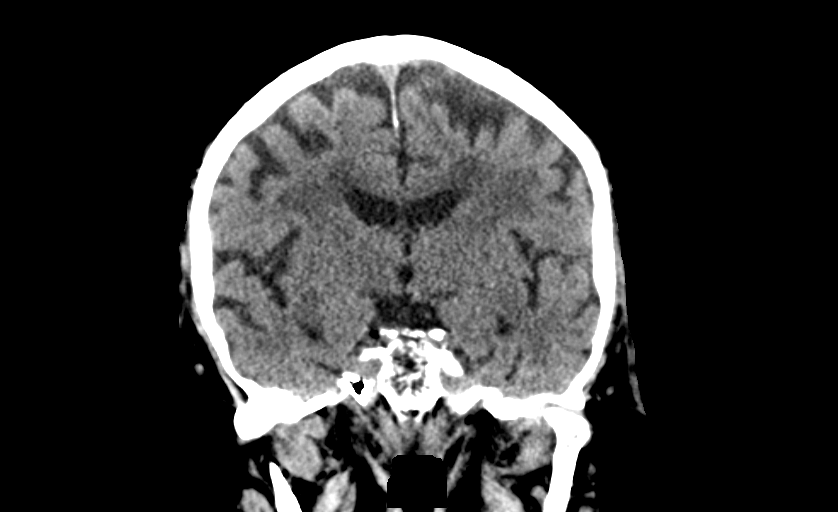

[Series 5: head 3.0 mpr · sagittal · 0.34mm/px · 3 of 61 slices shown (2 of 2)]
[im 21/61  brain]
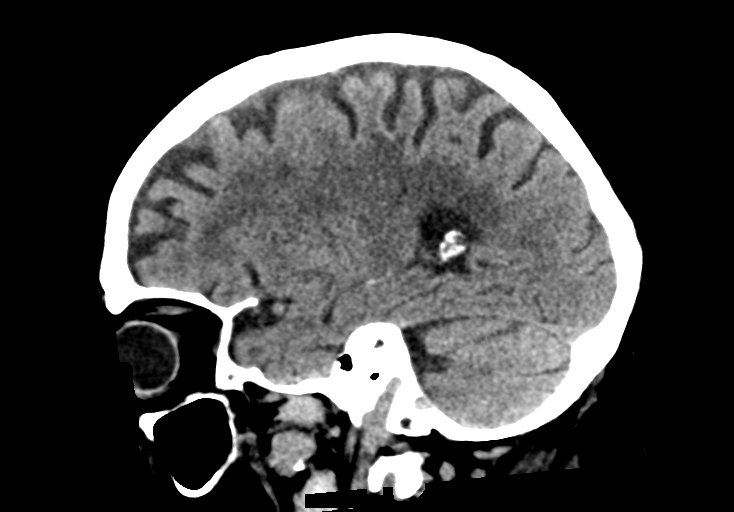
[im 31/61  brain]
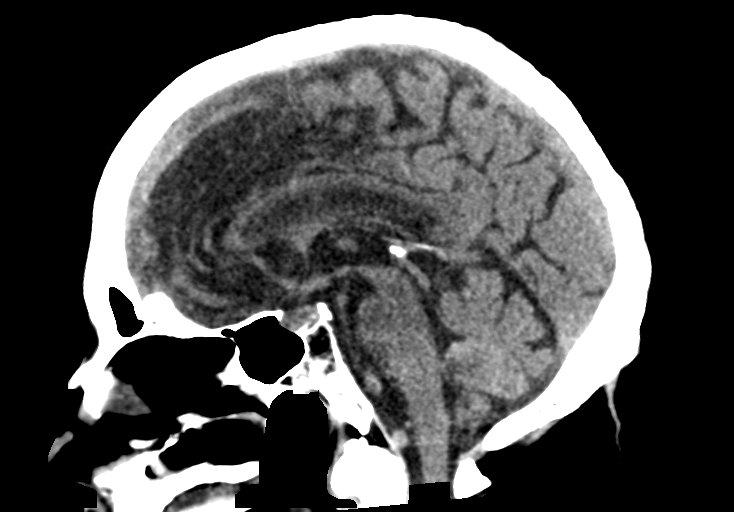
[im 41/61  brain]
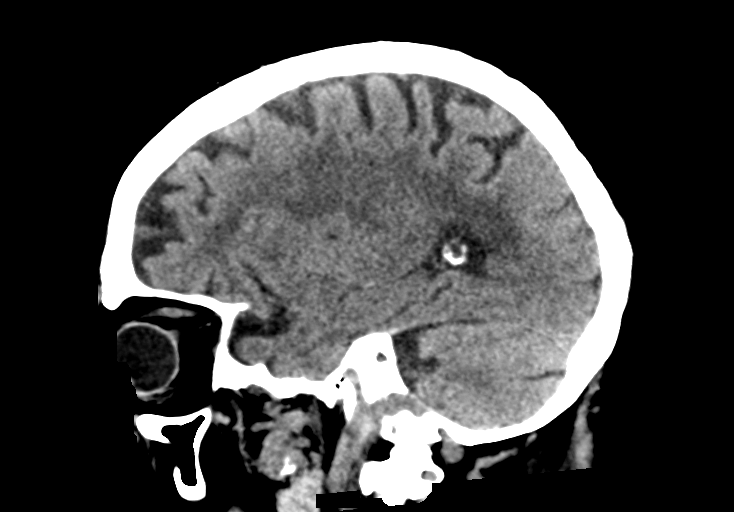

[14 of 47 positions shown; findings below may reference images not displayed]

FINDINGS: Brain: No mass lesion, intraparenchymal hemorrhage or extra-axial
collection. No evidence of acute cortical infarct. There is
confluent periventricular hypoattenuation compatible with chronic
microvascular ischemia.

Vascular: There is calcific atherosclerosis of the intracranial
internal carotid and vertebral arteries.

Skull: Normal visualized skull base, calvarium and extracranial soft
tissues.

Sinuses/Orbits: There is an atelectatic left maxillary sinus with
internal frothy secretions. The other paranasal sinuses are normal.
No mastoid or middle ear effusion. Normal orbits.
IMPRESSION: 1. No acute intracranial abnormality.
2. Chronic microvascular ischemia.

## 2018-01-23 DIAGNOSIS — S41111D Laceration without foreign body of right upper arm, subsequent encounter: Secondary | ICD-10-CM | POA: Diagnosis not present

## 2018-01-23 DIAGNOSIS — Z9181 History of falling: Secondary | ICD-10-CM | POA: Diagnosis not present

## 2018-01-23 DIAGNOSIS — I5032 Chronic diastolic (congestive) heart failure: Secondary | ICD-10-CM | POA: Diagnosis not present

## 2018-01-23 DIAGNOSIS — Z9981 Dependence on supplemental oxygen: Secondary | ICD-10-CM | POA: Diagnosis not present

## 2018-01-23 DIAGNOSIS — F329 Major depressive disorder, single episode, unspecified: Secondary | ICD-10-CM | POA: Diagnosis not present

## 2018-01-23 DIAGNOSIS — K219 Gastro-esophageal reflux disease without esophagitis: Secondary | ICD-10-CM | POA: Diagnosis not present

## 2018-01-23 DIAGNOSIS — E039 Hypothyroidism, unspecified: Secondary | ICD-10-CM | POA: Diagnosis not present

## 2018-01-23 DIAGNOSIS — G4733 Obstructive sleep apnea (adult) (pediatric): Secondary | ICD-10-CM | POA: Diagnosis not present

## 2018-01-23 DIAGNOSIS — M1991 Primary osteoarthritis, unspecified site: Secondary | ICD-10-CM | POA: Diagnosis not present

## 2018-01-23 DIAGNOSIS — I442 Atrioventricular block, complete: Secondary | ICD-10-CM | POA: Diagnosis not present

## 2018-01-23 DIAGNOSIS — E785 Hyperlipidemia, unspecified: Secondary | ICD-10-CM | POA: Diagnosis not present

## 2018-01-23 DIAGNOSIS — S41112D Laceration without foreign body of left upper arm, subsequent encounter: Secondary | ICD-10-CM | POA: Diagnosis not present

## 2018-01-23 DIAGNOSIS — I130 Hypertensive heart and chronic kidney disease with heart failure and stage 1 through stage 4 chronic kidney disease, or unspecified chronic kidney disease: Secondary | ICD-10-CM | POA: Diagnosis not present

## 2018-01-23 DIAGNOSIS — I69393 Ataxia following cerebral infarction: Secondary | ICD-10-CM | POA: Diagnosis not present

## 2018-01-23 DIAGNOSIS — N183 Chronic kidney disease, stage 3 (moderate): Secondary | ICD-10-CM | POA: Diagnosis not present

## 2018-01-23 DIAGNOSIS — I480 Paroxysmal atrial fibrillation: Secondary | ICD-10-CM | POA: Diagnosis not present

## 2018-01-23 DIAGNOSIS — E1122 Type 2 diabetes mellitus with diabetic chronic kidney disease: Secondary | ICD-10-CM | POA: Diagnosis not present

## 2018-01-23 DIAGNOSIS — Z8781 Personal history of (healed) traumatic fracture: Secondary | ICD-10-CM | POA: Diagnosis not present

## 2018-01-23 DIAGNOSIS — I69318 Other symptoms and signs involving cognitive functions following cerebral infarction: Secondary | ICD-10-CM | POA: Diagnosis not present

## 2018-01-23 DIAGNOSIS — F0150 Vascular dementia without behavioral disturbance: Secondary | ICD-10-CM | POA: Diagnosis not present

## 2018-01-23 DIAGNOSIS — Q2733 Arteriovenous malformation of digestive system vessel: Secondary | ICD-10-CM | POA: Diagnosis not present

## 2018-01-23 DIAGNOSIS — R634 Abnormal weight loss: Secondary | ICD-10-CM | POA: Diagnosis not present

## 2018-01-23 DIAGNOSIS — F419 Anxiety disorder, unspecified: Secondary | ICD-10-CM | POA: Diagnosis not present

## 2018-01-23 DIAGNOSIS — Z950 Presence of cardiac pacemaker: Secondary | ICD-10-CM | POA: Diagnosis not present

## 2018-01-25 DIAGNOSIS — I130 Hypertensive heart and chronic kidney disease with heart failure and stage 1 through stage 4 chronic kidney disease, or unspecified chronic kidney disease: Secondary | ICD-10-CM | POA: Diagnosis not present

## 2018-01-25 DIAGNOSIS — I5032 Chronic diastolic (congestive) heart failure: Secondary | ICD-10-CM | POA: Diagnosis not present

## 2018-01-25 DIAGNOSIS — N183 Chronic kidney disease, stage 3 (moderate): Secondary | ICD-10-CM | POA: Diagnosis not present

## 2018-01-25 DIAGNOSIS — E1122 Type 2 diabetes mellitus with diabetic chronic kidney disease: Secondary | ICD-10-CM | POA: Diagnosis not present

## 2018-01-25 DIAGNOSIS — E785 Hyperlipidemia, unspecified: Secondary | ICD-10-CM | POA: Diagnosis not present

## 2018-01-25 DIAGNOSIS — I480 Paroxysmal atrial fibrillation: Secondary | ICD-10-CM | POA: Diagnosis not present

## 2018-01-26 DIAGNOSIS — E785 Hyperlipidemia, unspecified: Secondary | ICD-10-CM | POA: Diagnosis not present

## 2018-01-26 DIAGNOSIS — I130 Hypertensive heart and chronic kidney disease with heart failure and stage 1 through stage 4 chronic kidney disease, or unspecified chronic kidney disease: Secondary | ICD-10-CM | POA: Diagnosis not present

## 2018-01-26 DIAGNOSIS — I480 Paroxysmal atrial fibrillation: Secondary | ICD-10-CM | POA: Diagnosis not present

## 2018-01-26 DIAGNOSIS — N183 Chronic kidney disease, stage 3 (moderate): Secondary | ICD-10-CM | POA: Diagnosis not present

## 2018-01-26 DIAGNOSIS — E1122 Type 2 diabetes mellitus with diabetic chronic kidney disease: Secondary | ICD-10-CM | POA: Diagnosis not present

## 2018-01-26 DIAGNOSIS — I5032 Chronic diastolic (congestive) heart failure: Secondary | ICD-10-CM | POA: Diagnosis not present

## 2018-01-27 DIAGNOSIS — I130 Hypertensive heart and chronic kidney disease with heart failure and stage 1 through stage 4 chronic kidney disease, or unspecified chronic kidney disease: Secondary | ICD-10-CM | POA: Diagnosis not present

## 2018-01-27 DIAGNOSIS — I5032 Chronic diastolic (congestive) heart failure: Secondary | ICD-10-CM | POA: Diagnosis not present

## 2018-01-27 DIAGNOSIS — E785 Hyperlipidemia, unspecified: Secondary | ICD-10-CM | POA: Diagnosis not present

## 2018-01-27 DIAGNOSIS — N183 Chronic kidney disease, stage 3 (moderate): Secondary | ICD-10-CM | POA: Diagnosis not present

## 2018-01-27 DIAGNOSIS — I480 Paroxysmal atrial fibrillation: Secondary | ICD-10-CM | POA: Diagnosis not present

## 2018-01-27 DIAGNOSIS — E1122 Type 2 diabetes mellitus with diabetic chronic kidney disease: Secondary | ICD-10-CM | POA: Diagnosis not present

## 2018-01-28 DIAGNOSIS — N183 Chronic kidney disease, stage 3 (moderate): Secondary | ICD-10-CM | POA: Diagnosis not present

## 2018-01-28 DIAGNOSIS — I130 Hypertensive heart and chronic kidney disease with heart failure and stage 1 through stage 4 chronic kidney disease, or unspecified chronic kidney disease: Secondary | ICD-10-CM | POA: Diagnosis not present

## 2018-01-28 DIAGNOSIS — I5032 Chronic diastolic (congestive) heart failure: Secondary | ICD-10-CM | POA: Diagnosis not present

## 2018-01-28 DIAGNOSIS — E785 Hyperlipidemia, unspecified: Secondary | ICD-10-CM | POA: Diagnosis not present

## 2018-01-28 DIAGNOSIS — E1122 Type 2 diabetes mellitus with diabetic chronic kidney disease: Secondary | ICD-10-CM | POA: Diagnosis not present

## 2018-01-28 DIAGNOSIS — I480 Paroxysmal atrial fibrillation: Secondary | ICD-10-CM | POA: Diagnosis not present

## 2018-01-29 DIAGNOSIS — I5032 Chronic diastolic (congestive) heart failure: Secondary | ICD-10-CM | POA: Diagnosis not present

## 2018-01-29 DIAGNOSIS — N183 Chronic kidney disease, stage 3 (moderate): Secondary | ICD-10-CM | POA: Diagnosis not present

## 2018-01-29 DIAGNOSIS — I480 Paroxysmal atrial fibrillation: Secondary | ICD-10-CM | POA: Diagnosis not present

## 2018-01-29 DIAGNOSIS — I130 Hypertensive heart and chronic kidney disease with heart failure and stage 1 through stage 4 chronic kidney disease, or unspecified chronic kidney disease: Secondary | ICD-10-CM | POA: Diagnosis not present

## 2018-01-29 DIAGNOSIS — E1122 Type 2 diabetes mellitus with diabetic chronic kidney disease: Secondary | ICD-10-CM | POA: Diagnosis not present

## 2018-01-29 DIAGNOSIS — E785 Hyperlipidemia, unspecified: Secondary | ICD-10-CM | POA: Diagnosis not present

## 2018-01-30 LAB — CUP PACEART REMOTE DEVICE CHECK
Brady Statistic AP VP Percent: 0 %
Brady Statistic AP VS Percent: 0 %
Brady Statistic AS VP Percent: 67 %
Brady Statistic AS VS Percent: 33 %
Implantable Lead Implant Date: 20151010
Implantable Lead Implant Date: 20151010
Implantable Lead Location: 753859
Implantable Lead Location: 753860
Implantable Lead Model: 5092
Lead Channel Impedance Value: 403 Ohm
Lead Channel Impedance Value: 518 Ohm
Lead Channel Pacing Threshold Amplitude: 0.625 V
Lead Channel Pacing Threshold Amplitude: 0.75 V
Lead Channel Pacing Threshold Pulse Width: 0.4 ms
Lead Channel Setting Pacing Amplitude: 2 V
MDC IDC MSMT BATTERY IMPEDANCE: 188 Ohm
MDC IDC MSMT BATTERY REMAINING LONGEVITY: 120 mo
MDC IDC MSMT BATTERY VOLTAGE: 2.79 V
MDC IDC MSMT LEADCHNL RA PACING THRESHOLD PULSEWIDTH: 0.4 ms
MDC IDC PG IMPLANT DT: 20151010
MDC IDC SESS DTM: 20190522144132
MDC IDC SET LEADCHNL RV PACING AMPLITUDE: 2.5 V
MDC IDC SET LEADCHNL RV PACING PULSEWIDTH: 0.4 ms
MDC IDC SET LEADCHNL RV SENSING SENSITIVITY: 4 mV

## 2018-02-01 DIAGNOSIS — E785 Hyperlipidemia, unspecified: Secondary | ICD-10-CM | POA: Diagnosis not present

## 2018-02-01 DIAGNOSIS — E1122 Type 2 diabetes mellitus with diabetic chronic kidney disease: Secondary | ICD-10-CM | POA: Diagnosis not present

## 2018-02-01 DIAGNOSIS — I5032 Chronic diastolic (congestive) heart failure: Secondary | ICD-10-CM | POA: Diagnosis not present

## 2018-02-01 DIAGNOSIS — I480 Paroxysmal atrial fibrillation: Secondary | ICD-10-CM | POA: Diagnosis not present

## 2018-02-01 DIAGNOSIS — N183 Chronic kidney disease, stage 3 (moderate): Secondary | ICD-10-CM | POA: Diagnosis not present

## 2018-02-01 DIAGNOSIS — I130 Hypertensive heart and chronic kidney disease with heart failure and stage 1 through stage 4 chronic kidney disease, or unspecified chronic kidney disease: Secondary | ICD-10-CM | POA: Diagnosis not present

## 2018-02-02 DIAGNOSIS — E785 Hyperlipidemia, unspecified: Secondary | ICD-10-CM | POA: Diagnosis not present

## 2018-02-02 DIAGNOSIS — N183 Chronic kidney disease, stage 3 (moderate): Secondary | ICD-10-CM | POA: Diagnosis not present

## 2018-02-02 DIAGNOSIS — I5032 Chronic diastolic (congestive) heart failure: Secondary | ICD-10-CM | POA: Diagnosis not present

## 2018-02-02 DIAGNOSIS — E1122 Type 2 diabetes mellitus with diabetic chronic kidney disease: Secondary | ICD-10-CM | POA: Diagnosis not present

## 2018-02-02 DIAGNOSIS — I130 Hypertensive heart and chronic kidney disease with heart failure and stage 1 through stage 4 chronic kidney disease, or unspecified chronic kidney disease: Secondary | ICD-10-CM | POA: Diagnosis not present

## 2018-02-02 DIAGNOSIS — I480 Paroxysmal atrial fibrillation: Secondary | ICD-10-CM | POA: Diagnosis not present

## 2018-02-03 DIAGNOSIS — N183 Chronic kidney disease, stage 3 (moderate): Secondary | ICD-10-CM | POA: Diagnosis not present

## 2018-02-03 DIAGNOSIS — I480 Paroxysmal atrial fibrillation: Secondary | ICD-10-CM | POA: Diagnosis not present

## 2018-02-03 DIAGNOSIS — E1122 Type 2 diabetes mellitus with diabetic chronic kidney disease: Secondary | ICD-10-CM | POA: Diagnosis not present

## 2018-02-03 DIAGNOSIS — I5032 Chronic diastolic (congestive) heart failure: Secondary | ICD-10-CM | POA: Diagnosis not present

## 2018-02-03 DIAGNOSIS — E785 Hyperlipidemia, unspecified: Secondary | ICD-10-CM | POA: Diagnosis not present

## 2018-02-03 DIAGNOSIS — I130 Hypertensive heart and chronic kidney disease with heart failure and stage 1 through stage 4 chronic kidney disease, or unspecified chronic kidney disease: Secondary | ICD-10-CM | POA: Diagnosis not present

## 2018-02-04 DIAGNOSIS — I130 Hypertensive heart and chronic kidney disease with heart failure and stage 1 through stage 4 chronic kidney disease, or unspecified chronic kidney disease: Secondary | ICD-10-CM | POA: Diagnosis not present

## 2018-02-04 DIAGNOSIS — I480 Paroxysmal atrial fibrillation: Secondary | ICD-10-CM | POA: Diagnosis not present

## 2018-02-04 DIAGNOSIS — N183 Chronic kidney disease, stage 3 (moderate): Secondary | ICD-10-CM | POA: Diagnosis not present

## 2018-02-04 DIAGNOSIS — E785 Hyperlipidemia, unspecified: Secondary | ICD-10-CM | POA: Diagnosis not present

## 2018-02-04 DIAGNOSIS — E1122 Type 2 diabetes mellitus with diabetic chronic kidney disease: Secondary | ICD-10-CM | POA: Diagnosis not present

## 2018-02-04 DIAGNOSIS — I5032 Chronic diastolic (congestive) heart failure: Secondary | ICD-10-CM | POA: Diagnosis not present

## 2018-02-05 DIAGNOSIS — I5032 Chronic diastolic (congestive) heart failure: Secondary | ICD-10-CM | POA: Diagnosis not present

## 2018-02-05 DIAGNOSIS — I130 Hypertensive heart and chronic kidney disease with heart failure and stage 1 through stage 4 chronic kidney disease, or unspecified chronic kidney disease: Secondary | ICD-10-CM | POA: Diagnosis not present

## 2018-02-05 DIAGNOSIS — E785 Hyperlipidemia, unspecified: Secondary | ICD-10-CM | POA: Diagnosis not present

## 2018-02-05 DIAGNOSIS — N183 Chronic kidney disease, stage 3 (moderate): Secondary | ICD-10-CM | POA: Diagnosis not present

## 2018-02-05 DIAGNOSIS — I480 Paroxysmal atrial fibrillation: Secondary | ICD-10-CM | POA: Diagnosis not present

## 2018-02-05 DIAGNOSIS — E1122 Type 2 diabetes mellitus with diabetic chronic kidney disease: Secondary | ICD-10-CM | POA: Diagnosis not present

## 2018-02-08 DIAGNOSIS — I480 Paroxysmal atrial fibrillation: Secondary | ICD-10-CM | POA: Diagnosis not present

## 2018-02-08 DIAGNOSIS — I5032 Chronic diastolic (congestive) heart failure: Secondary | ICD-10-CM | POA: Diagnosis not present

## 2018-02-08 DIAGNOSIS — I130 Hypertensive heart and chronic kidney disease with heart failure and stage 1 through stage 4 chronic kidney disease, or unspecified chronic kidney disease: Secondary | ICD-10-CM | POA: Diagnosis not present

## 2018-02-08 DIAGNOSIS — E785 Hyperlipidemia, unspecified: Secondary | ICD-10-CM | POA: Diagnosis not present

## 2018-02-08 DIAGNOSIS — N183 Chronic kidney disease, stage 3 (moderate): Secondary | ICD-10-CM | POA: Diagnosis not present

## 2018-02-08 DIAGNOSIS — E1122 Type 2 diabetes mellitus with diabetic chronic kidney disease: Secondary | ICD-10-CM | POA: Diagnosis not present

## 2018-02-09 DIAGNOSIS — E1122 Type 2 diabetes mellitus with diabetic chronic kidney disease: Secondary | ICD-10-CM | POA: Diagnosis not present

## 2018-02-09 DIAGNOSIS — E785 Hyperlipidemia, unspecified: Secondary | ICD-10-CM | POA: Diagnosis not present

## 2018-02-09 DIAGNOSIS — N183 Chronic kidney disease, stage 3 (moderate): Secondary | ICD-10-CM | POA: Diagnosis not present

## 2018-02-09 DIAGNOSIS — I5032 Chronic diastolic (congestive) heart failure: Secondary | ICD-10-CM | POA: Diagnosis not present

## 2018-02-09 DIAGNOSIS — I130 Hypertensive heart and chronic kidney disease with heart failure and stage 1 through stage 4 chronic kidney disease, or unspecified chronic kidney disease: Secondary | ICD-10-CM | POA: Diagnosis not present

## 2018-02-09 DIAGNOSIS — I480 Paroxysmal atrial fibrillation: Secondary | ICD-10-CM | POA: Diagnosis not present

## 2018-02-10 DIAGNOSIS — N183 Chronic kidney disease, stage 3 (moderate): Secondary | ICD-10-CM | POA: Diagnosis not present

## 2018-02-10 DIAGNOSIS — E1122 Type 2 diabetes mellitus with diabetic chronic kidney disease: Secondary | ICD-10-CM | POA: Diagnosis not present

## 2018-02-10 DIAGNOSIS — I130 Hypertensive heart and chronic kidney disease with heart failure and stage 1 through stage 4 chronic kidney disease, or unspecified chronic kidney disease: Secondary | ICD-10-CM | POA: Diagnosis not present

## 2018-02-10 DIAGNOSIS — E785 Hyperlipidemia, unspecified: Secondary | ICD-10-CM | POA: Diagnosis not present

## 2018-02-10 DIAGNOSIS — I5032 Chronic diastolic (congestive) heart failure: Secondary | ICD-10-CM | POA: Diagnosis not present

## 2018-02-10 DIAGNOSIS — I480 Paroxysmal atrial fibrillation: Secondary | ICD-10-CM | POA: Diagnosis not present

## 2018-02-11 DIAGNOSIS — E785 Hyperlipidemia, unspecified: Secondary | ICD-10-CM | POA: Diagnosis not present

## 2018-02-11 DIAGNOSIS — N183 Chronic kidney disease, stage 3 (moderate): Secondary | ICD-10-CM | POA: Diagnosis not present

## 2018-02-11 DIAGNOSIS — I5032 Chronic diastolic (congestive) heart failure: Secondary | ICD-10-CM | POA: Diagnosis not present

## 2018-02-11 DIAGNOSIS — I130 Hypertensive heart and chronic kidney disease with heart failure and stage 1 through stage 4 chronic kidney disease, or unspecified chronic kidney disease: Secondary | ICD-10-CM | POA: Diagnosis not present

## 2018-02-11 DIAGNOSIS — E1122 Type 2 diabetes mellitus with diabetic chronic kidney disease: Secondary | ICD-10-CM | POA: Diagnosis not present

## 2018-02-11 DIAGNOSIS — I480 Paroxysmal atrial fibrillation: Secondary | ICD-10-CM | POA: Diagnosis not present

## 2018-02-12 DIAGNOSIS — I480 Paroxysmal atrial fibrillation: Secondary | ICD-10-CM | POA: Diagnosis not present

## 2018-02-12 DIAGNOSIS — I130 Hypertensive heart and chronic kidney disease with heart failure and stage 1 through stage 4 chronic kidney disease, or unspecified chronic kidney disease: Secondary | ICD-10-CM | POA: Diagnosis not present

## 2018-02-12 DIAGNOSIS — I5032 Chronic diastolic (congestive) heart failure: Secondary | ICD-10-CM | POA: Diagnosis not present

## 2018-02-12 DIAGNOSIS — E1122 Type 2 diabetes mellitus with diabetic chronic kidney disease: Secondary | ICD-10-CM | POA: Diagnosis not present

## 2018-02-12 DIAGNOSIS — E785 Hyperlipidemia, unspecified: Secondary | ICD-10-CM | POA: Diagnosis not present

## 2018-02-12 DIAGNOSIS — N183 Chronic kidney disease, stage 3 (moderate): Secondary | ICD-10-CM | POA: Diagnosis not present

## 2018-02-15 DIAGNOSIS — I480 Paroxysmal atrial fibrillation: Secondary | ICD-10-CM | POA: Diagnosis not present

## 2018-02-15 DIAGNOSIS — E1122 Type 2 diabetes mellitus with diabetic chronic kidney disease: Secondary | ICD-10-CM | POA: Diagnosis not present

## 2018-02-15 DIAGNOSIS — E785 Hyperlipidemia, unspecified: Secondary | ICD-10-CM | POA: Diagnosis not present

## 2018-02-15 DIAGNOSIS — I5032 Chronic diastolic (congestive) heart failure: Secondary | ICD-10-CM | POA: Diagnosis not present

## 2018-02-15 DIAGNOSIS — N183 Chronic kidney disease, stage 3 (moderate): Secondary | ICD-10-CM | POA: Diagnosis not present

## 2018-02-15 DIAGNOSIS — I130 Hypertensive heart and chronic kidney disease with heart failure and stage 1 through stage 4 chronic kidney disease, or unspecified chronic kidney disease: Secondary | ICD-10-CM | POA: Diagnosis not present

## 2018-02-16 DIAGNOSIS — N183 Chronic kidney disease, stage 3 (moderate): Secondary | ICD-10-CM | POA: Diagnosis not present

## 2018-02-16 DIAGNOSIS — I480 Paroxysmal atrial fibrillation: Secondary | ICD-10-CM | POA: Diagnosis not present

## 2018-02-16 DIAGNOSIS — I5032 Chronic diastolic (congestive) heart failure: Secondary | ICD-10-CM | POA: Diagnosis not present

## 2018-02-16 DIAGNOSIS — I130 Hypertensive heart and chronic kidney disease with heart failure and stage 1 through stage 4 chronic kidney disease, or unspecified chronic kidney disease: Secondary | ICD-10-CM | POA: Diagnosis not present

## 2018-02-16 DIAGNOSIS — E785 Hyperlipidemia, unspecified: Secondary | ICD-10-CM | POA: Diagnosis not present

## 2018-02-16 DIAGNOSIS — E1122 Type 2 diabetes mellitus with diabetic chronic kidney disease: Secondary | ICD-10-CM | POA: Diagnosis not present

## 2018-02-17 DIAGNOSIS — E1122 Type 2 diabetes mellitus with diabetic chronic kidney disease: Secondary | ICD-10-CM | POA: Diagnosis not present

## 2018-02-17 DIAGNOSIS — I5032 Chronic diastolic (congestive) heart failure: Secondary | ICD-10-CM | POA: Diagnosis not present

## 2018-02-17 DIAGNOSIS — N183 Chronic kidney disease, stage 3 (moderate): Secondary | ICD-10-CM | POA: Diagnosis not present

## 2018-02-17 DIAGNOSIS — E785 Hyperlipidemia, unspecified: Secondary | ICD-10-CM | POA: Diagnosis not present

## 2018-02-17 DIAGNOSIS — I130 Hypertensive heart and chronic kidney disease with heart failure and stage 1 through stage 4 chronic kidney disease, or unspecified chronic kidney disease: Secondary | ICD-10-CM | POA: Diagnosis not present

## 2018-02-17 DIAGNOSIS — I480 Paroxysmal atrial fibrillation: Secondary | ICD-10-CM | POA: Diagnosis not present

## 2018-02-18 DIAGNOSIS — E1122 Type 2 diabetes mellitus with diabetic chronic kidney disease: Secondary | ICD-10-CM | POA: Diagnosis not present

## 2018-02-18 DIAGNOSIS — I480 Paroxysmal atrial fibrillation: Secondary | ICD-10-CM | POA: Diagnosis not present

## 2018-02-18 DIAGNOSIS — N183 Chronic kidney disease, stage 3 (moderate): Secondary | ICD-10-CM | POA: Diagnosis not present

## 2018-02-18 DIAGNOSIS — I130 Hypertensive heart and chronic kidney disease with heart failure and stage 1 through stage 4 chronic kidney disease, or unspecified chronic kidney disease: Secondary | ICD-10-CM | POA: Diagnosis not present

## 2018-02-18 DIAGNOSIS — I5032 Chronic diastolic (congestive) heart failure: Secondary | ICD-10-CM | POA: Diagnosis not present

## 2018-02-18 DIAGNOSIS — E785 Hyperlipidemia, unspecified: Secondary | ICD-10-CM | POA: Diagnosis not present

## 2018-02-19 DIAGNOSIS — I130 Hypertensive heart and chronic kidney disease with heart failure and stage 1 through stage 4 chronic kidney disease, or unspecified chronic kidney disease: Secondary | ICD-10-CM | POA: Diagnosis not present

## 2018-02-19 DIAGNOSIS — E785 Hyperlipidemia, unspecified: Secondary | ICD-10-CM | POA: Diagnosis not present

## 2018-02-19 DIAGNOSIS — I480 Paroxysmal atrial fibrillation: Secondary | ICD-10-CM | POA: Diagnosis not present

## 2018-02-19 DIAGNOSIS — I5032 Chronic diastolic (congestive) heart failure: Secondary | ICD-10-CM | POA: Diagnosis not present

## 2018-02-19 DIAGNOSIS — E1122 Type 2 diabetes mellitus with diabetic chronic kidney disease: Secondary | ICD-10-CM | POA: Diagnosis not present

## 2018-02-19 DIAGNOSIS — N183 Chronic kidney disease, stage 3 (moderate): Secondary | ICD-10-CM | POA: Diagnosis not present

## 2018-02-20 DIAGNOSIS — I480 Paroxysmal atrial fibrillation: Secondary | ICD-10-CM | POA: Diagnosis not present

## 2018-02-20 DIAGNOSIS — E785 Hyperlipidemia, unspecified: Secondary | ICD-10-CM | POA: Diagnosis not present

## 2018-02-20 DIAGNOSIS — I130 Hypertensive heart and chronic kidney disease with heart failure and stage 1 through stage 4 chronic kidney disease, or unspecified chronic kidney disease: Secondary | ICD-10-CM | POA: Diagnosis not present

## 2018-02-20 DIAGNOSIS — N183 Chronic kidney disease, stage 3 (moderate): Secondary | ICD-10-CM | POA: Diagnosis not present

## 2018-02-20 DIAGNOSIS — I5032 Chronic diastolic (congestive) heart failure: Secondary | ICD-10-CM | POA: Diagnosis not present

## 2018-02-20 DIAGNOSIS — E1122 Type 2 diabetes mellitus with diabetic chronic kidney disease: Secondary | ICD-10-CM | POA: Diagnosis not present

## 2018-02-22 DIAGNOSIS — S41112D Laceration without foreign body of left upper arm, subsequent encounter: Secondary | ICD-10-CM | POA: Diagnosis not present

## 2018-02-22 DIAGNOSIS — F329 Major depressive disorder, single episode, unspecified: Secondary | ICD-10-CM | POA: Diagnosis not present

## 2018-02-22 DIAGNOSIS — S41111D Laceration without foreign body of right upper arm, subsequent encounter: Secondary | ICD-10-CM | POA: Diagnosis not present

## 2018-02-22 DIAGNOSIS — K219 Gastro-esophageal reflux disease without esophagitis: Secondary | ICD-10-CM | POA: Diagnosis not present

## 2018-02-22 DIAGNOSIS — Z950 Presence of cardiac pacemaker: Secondary | ICD-10-CM | POA: Diagnosis not present

## 2018-02-22 DIAGNOSIS — G4733 Obstructive sleep apnea (adult) (pediatric): Secondary | ICD-10-CM | POA: Diagnosis not present

## 2018-02-22 DIAGNOSIS — M1991 Primary osteoarthritis, unspecified site: Secondary | ICD-10-CM | POA: Diagnosis not present

## 2018-02-22 DIAGNOSIS — F0150 Vascular dementia without behavioral disturbance: Secondary | ICD-10-CM | POA: Diagnosis not present

## 2018-02-22 DIAGNOSIS — Z9981 Dependence on supplemental oxygen: Secondary | ICD-10-CM | POA: Diagnosis not present

## 2018-02-22 DIAGNOSIS — E785 Hyperlipidemia, unspecified: Secondary | ICD-10-CM | POA: Diagnosis not present

## 2018-02-22 DIAGNOSIS — I5032 Chronic diastolic (congestive) heart failure: Secondary | ICD-10-CM | POA: Diagnosis not present

## 2018-02-22 DIAGNOSIS — F419 Anxiety disorder, unspecified: Secondary | ICD-10-CM | POA: Diagnosis not present

## 2018-02-22 DIAGNOSIS — Z8781 Personal history of (healed) traumatic fracture: Secondary | ICD-10-CM | POA: Diagnosis not present

## 2018-02-22 DIAGNOSIS — Z9181 History of falling: Secondary | ICD-10-CM | POA: Diagnosis not present

## 2018-02-22 DIAGNOSIS — E039 Hypothyroidism, unspecified: Secondary | ICD-10-CM | POA: Diagnosis not present

## 2018-02-22 DIAGNOSIS — E1122 Type 2 diabetes mellitus with diabetic chronic kidney disease: Secondary | ICD-10-CM | POA: Diagnosis not present

## 2018-02-22 DIAGNOSIS — Q2733 Arteriovenous malformation of digestive system vessel: Secondary | ICD-10-CM | POA: Diagnosis not present

## 2018-02-22 DIAGNOSIS — I130 Hypertensive heart and chronic kidney disease with heart failure and stage 1 through stage 4 chronic kidney disease, or unspecified chronic kidney disease: Secondary | ICD-10-CM | POA: Diagnosis not present

## 2018-02-22 DIAGNOSIS — I442 Atrioventricular block, complete: Secondary | ICD-10-CM | POA: Diagnosis not present

## 2018-02-22 DIAGNOSIS — I69393 Ataxia following cerebral infarction: Secondary | ICD-10-CM | POA: Diagnosis not present

## 2018-02-22 DIAGNOSIS — N183 Chronic kidney disease, stage 3 (moderate): Secondary | ICD-10-CM | POA: Diagnosis not present

## 2018-02-22 DIAGNOSIS — I69318 Other symptoms and signs involving cognitive functions following cerebral infarction: Secondary | ICD-10-CM | POA: Diagnosis not present

## 2018-02-22 DIAGNOSIS — R634 Abnormal weight loss: Secondary | ICD-10-CM | POA: Diagnosis not present

## 2018-02-22 DIAGNOSIS — I480 Paroxysmal atrial fibrillation: Secondary | ICD-10-CM | POA: Diagnosis not present

## 2018-02-23 DIAGNOSIS — N183 Chronic kidney disease, stage 3 (moderate): Secondary | ICD-10-CM | POA: Diagnosis not present

## 2018-02-23 DIAGNOSIS — J9611 Chronic respiratory failure with hypoxia: Secondary | ICD-10-CM | POA: Diagnosis not present

## 2018-02-23 DIAGNOSIS — I5032 Chronic diastolic (congestive) heart failure: Secondary | ICD-10-CM | POA: Diagnosis not present

## 2018-02-23 DIAGNOSIS — E785 Hyperlipidemia, unspecified: Secondary | ICD-10-CM | POA: Diagnosis not present

## 2018-02-23 DIAGNOSIS — F39 Unspecified mood [affective] disorder: Secondary | ICD-10-CM | POA: Diagnosis not present

## 2018-02-23 DIAGNOSIS — E1159 Type 2 diabetes mellitus with other circulatory complications: Secondary | ICD-10-CM | POA: Diagnosis not present

## 2018-02-23 DIAGNOSIS — F0150 Vascular dementia without behavioral disturbance: Secondary | ICD-10-CM | POA: Diagnosis not present

## 2018-02-23 DIAGNOSIS — E1122 Type 2 diabetes mellitus with diabetic chronic kidney disease: Secondary | ICD-10-CM | POA: Diagnosis not present

## 2018-02-23 DIAGNOSIS — I130 Hypertensive heart and chronic kidney disease with heart failure and stage 1 through stage 4 chronic kidney disease, or unspecified chronic kidney disease: Secondary | ICD-10-CM | POA: Diagnosis not present

## 2018-02-23 DIAGNOSIS — I480 Paroxysmal atrial fibrillation: Secondary | ICD-10-CM | POA: Diagnosis not present

## 2018-02-24 DIAGNOSIS — I5032 Chronic diastolic (congestive) heart failure: Secondary | ICD-10-CM | POA: Diagnosis not present

## 2018-02-24 DIAGNOSIS — E785 Hyperlipidemia, unspecified: Secondary | ICD-10-CM | POA: Diagnosis not present

## 2018-02-24 DIAGNOSIS — I130 Hypertensive heart and chronic kidney disease with heart failure and stage 1 through stage 4 chronic kidney disease, or unspecified chronic kidney disease: Secondary | ICD-10-CM | POA: Diagnosis not present

## 2018-02-24 DIAGNOSIS — N183 Chronic kidney disease, stage 3 (moderate): Secondary | ICD-10-CM | POA: Diagnosis not present

## 2018-02-24 DIAGNOSIS — I480 Paroxysmal atrial fibrillation: Secondary | ICD-10-CM | POA: Diagnosis not present

## 2018-02-24 DIAGNOSIS — E1122 Type 2 diabetes mellitus with diabetic chronic kidney disease: Secondary | ICD-10-CM | POA: Diagnosis not present

## 2018-02-25 DIAGNOSIS — E785 Hyperlipidemia, unspecified: Secondary | ICD-10-CM | POA: Diagnosis not present

## 2018-02-25 DIAGNOSIS — N183 Chronic kidney disease, stage 3 (moderate): Secondary | ICD-10-CM | POA: Diagnosis not present

## 2018-02-25 DIAGNOSIS — I130 Hypertensive heart and chronic kidney disease with heart failure and stage 1 through stage 4 chronic kidney disease, or unspecified chronic kidney disease: Secondary | ICD-10-CM | POA: Diagnosis not present

## 2018-02-25 DIAGNOSIS — I5032 Chronic diastolic (congestive) heart failure: Secondary | ICD-10-CM | POA: Diagnosis not present

## 2018-02-25 DIAGNOSIS — E1122 Type 2 diabetes mellitus with diabetic chronic kidney disease: Secondary | ICD-10-CM | POA: Diagnosis not present

## 2018-02-25 DIAGNOSIS — I480 Paroxysmal atrial fibrillation: Secondary | ICD-10-CM | POA: Diagnosis not present

## 2018-02-26 DIAGNOSIS — E785 Hyperlipidemia, unspecified: Secondary | ICD-10-CM | POA: Diagnosis not present

## 2018-02-26 DIAGNOSIS — I130 Hypertensive heart and chronic kidney disease with heart failure and stage 1 through stage 4 chronic kidney disease, or unspecified chronic kidney disease: Secondary | ICD-10-CM | POA: Diagnosis not present

## 2018-02-26 DIAGNOSIS — N183 Chronic kidney disease, stage 3 (moderate): Secondary | ICD-10-CM | POA: Diagnosis not present

## 2018-02-26 DIAGNOSIS — I480 Paroxysmal atrial fibrillation: Secondary | ICD-10-CM | POA: Diagnosis not present

## 2018-02-26 DIAGNOSIS — I5032 Chronic diastolic (congestive) heart failure: Secondary | ICD-10-CM | POA: Diagnosis not present

## 2018-02-26 DIAGNOSIS — E1122 Type 2 diabetes mellitus with diabetic chronic kidney disease: Secondary | ICD-10-CM | POA: Diagnosis not present

## 2018-03-01 ENCOUNTER — Telehealth: Payer: Self-pay | Admitting: *Deleted

## 2018-03-01 DIAGNOSIS — E785 Hyperlipidemia, unspecified: Secondary | ICD-10-CM | POA: Diagnosis not present

## 2018-03-01 DIAGNOSIS — R451 Restlessness and agitation: Secondary | ICD-10-CM | POA: Diagnosis not present

## 2018-03-01 DIAGNOSIS — I480 Paroxysmal atrial fibrillation: Secondary | ICD-10-CM | POA: Diagnosis not present

## 2018-03-01 DIAGNOSIS — I5032 Chronic diastolic (congestive) heart failure: Secondary | ICD-10-CM | POA: Diagnosis not present

## 2018-03-01 DIAGNOSIS — I130 Hypertensive heart and chronic kidney disease with heart failure and stage 1 through stage 4 chronic kidney disease, or unspecified chronic kidney disease: Secondary | ICD-10-CM | POA: Diagnosis not present

## 2018-03-01 DIAGNOSIS — E1122 Type 2 diabetes mellitus with diabetic chronic kidney disease: Secondary | ICD-10-CM | POA: Diagnosis not present

## 2018-03-01 DIAGNOSIS — N183 Chronic kidney disease, stage 3 (moderate): Secondary | ICD-10-CM | POA: Diagnosis not present

## 2018-03-01 NOTE — Telephone Encounter (Signed)
[facility] [place] [facility] Patient Name: Richard [name] Gender: Male DOB: 07-21-1931 Age: 84 Y 33 M 5 D Return Phone Number: [phone] (Primary) Address: City/State/Zip: [place] Client [place] Night - Client Client Site [place] Physician Viviana Simpler - MD Contact Type Call Who Is [place] / [place] Call Type Triage / Clinical Caller Name Dellis Filbert Relationship To Patient Care Giver Return Phone Number [phone] (Primary) Facility Name [facility] Number [phone] or [phone] Chief Complaint Paging or Request for Consult Reason for Call Request to speak to Physician Initial Comment Caller states she is calling from [facility]. One of her patients has had severe agitation tonight. She has to have an order to give a one time dose of haldol.Please call her back at [phone] or [phone] Additional Comment Translation No No Triage Reason Other Nurse Assessment Nurse: Alinda Money, RN, Gwenette Greet Date/Time (Eastern Time): 02/27/2018 2:14:38 AM Confirm and document reason for call. If symptomatic, describe symptoms. ---Care giver in the nursing home calls for a one time dose of haldol for patient who is agitated and taking off oxygen and trying to get out of gerry chair. Does the patient have any new or worsening symptoms? ---Yes Will a triage be completed? ---No Select reason for no triage. ---Other Please document clinical information provided and list any resource used. ---nursing home patient Nurse: Alinda Money, RN, Gwenette Greet Date/Time Eilene Ghazi Time): 02/27/2018 2:16:23 AM Please select the assessment type ---Verbal order / New medication order Does the client directives allow for assistance with medications after hours? ---Yes Other current medications? ---Unknown Medication allergies? ---Unknown Does the client  directive allow for RN to call in the medication order to the pharmacy? ---Yes PLEASE NOTE: All timestamps contained within this report are represented as [place] Standard Time. CONFIDENTIALTY NOTICE: This fax transmission is intended only for the addressee. It contains information that is legally privileged, confidential or otherwise protected from use or disclosure. If you are not the intended recipient, you are strictly prohibited from reviewing, disclosing, copying using or disseminating any of this information or taking any action in reliance on or regarding this information. If you have received this fax in error, please notify us immediately by telephone so that we can arrange for its return to [place]. Phone: [phone], Toll-Free: [phone], Fax: [phone] Page: 2 of 2 Call Id: [id number] Guidelines Guideline Title Affirmed Question Affirmed Notes Nurse Date/Time Eilene Ghazi Time) Disp. Time Eilene Ghazi Time) Disposition Final User 02/27/2018 2:20:31 AM Paged On Call back to [facility], [place], Gwenette Greet 02/27/2018 2:52:00 AM Paged On Call back to [facility], [place], Gwenette Greet 02/27/2018 3:06:47 AM Clinical Call Yes Goins, RN, [facility] DoctorName Phone DateTime Result/Outcome Message Type Notes Penni Homans - MD [phone] 02/27/2018 2:20:31 AM Paged On Call Back to Call Center Doctor Paged please call back for pt in skilled nursing care [facility] RN Penni Homans - MD [phone] 02/27/2018 2:52:00 AM Paged On Call Back to Call Center Doctor Paged Please call [facility] [place] for pt in skilled nursing care Penni Homans - MD 02/27/2018 3:06:21 AM Spoke with On Call - [place] physician information and telephone numbe for [facility], Dellis Filbert [phone]. She will call the nurse.

## 2018-03-01 NOTE — Telephone Encounter (Signed)
I have addressed this issue at [facility] today. He did much better last night but has had periodic problems over the past week. I generally will not use prn antipsychotics---- I did increase his regular quetiapine at bedtime though

## 2018-03-02 DIAGNOSIS — I5032 Chronic diastolic (congestive) heart failure: Secondary | ICD-10-CM | POA: Diagnosis not present

## 2018-03-02 DIAGNOSIS — E1122 Type 2 diabetes mellitus with diabetic chronic kidney disease: Secondary | ICD-10-CM | POA: Diagnosis not present

## 2018-03-02 DIAGNOSIS — I480 Paroxysmal atrial fibrillation: Secondary | ICD-10-CM | POA: Diagnosis not present

## 2018-03-02 DIAGNOSIS — E039 Hypothyroidism, unspecified: Secondary | ICD-10-CM | POA: Diagnosis not present

## 2018-03-02 DIAGNOSIS — I130 Hypertensive heart and chronic kidney disease with heart failure and stage 1 through stage 4 chronic kidney disease, or unspecified chronic kidney disease: Secondary | ICD-10-CM | POA: Diagnosis not present

## 2018-03-02 DIAGNOSIS — E785 Hyperlipidemia, unspecified: Secondary | ICD-10-CM | POA: Diagnosis not present

## 2018-03-02 DIAGNOSIS — N183 Chronic kidney disease, stage 3 (moderate): Secondary | ICD-10-CM | POA: Diagnosis not present

## 2018-03-02 DIAGNOSIS — D631 Anemia in chronic kidney disease: Secondary | ICD-10-CM | POA: Diagnosis not present

## 2018-03-03 DIAGNOSIS — E1122 Type 2 diabetes mellitus with diabetic chronic kidney disease: Secondary | ICD-10-CM | POA: Diagnosis not present

## 2018-03-03 DIAGNOSIS — I5032 Chronic diastolic (congestive) heart failure: Secondary | ICD-10-CM | POA: Diagnosis not present

## 2018-03-03 DIAGNOSIS — E785 Hyperlipidemia, unspecified: Secondary | ICD-10-CM | POA: Diagnosis not present

## 2018-03-03 DIAGNOSIS — N183 Chronic kidney disease, stage 3 (moderate): Secondary | ICD-10-CM | POA: Diagnosis not present

## 2018-03-03 DIAGNOSIS — I130 Hypertensive heart and chronic kidney disease with heart failure and stage 1 through stage 4 chronic kidney disease, or unspecified chronic kidney disease: Secondary | ICD-10-CM | POA: Diagnosis not present

## 2018-03-03 DIAGNOSIS — I480 Paroxysmal atrial fibrillation: Secondary | ICD-10-CM | POA: Diagnosis not present

## 2018-03-04 DIAGNOSIS — N183 Chronic kidney disease, stage 3 (moderate): Secondary | ICD-10-CM | POA: Diagnosis not present

## 2018-03-04 DIAGNOSIS — E1122 Type 2 diabetes mellitus with diabetic chronic kidney disease: Secondary | ICD-10-CM | POA: Diagnosis not present

## 2018-03-04 DIAGNOSIS — I130 Hypertensive heart and chronic kidney disease with heart failure and stage 1 through stage 4 chronic kidney disease, or unspecified chronic kidney disease: Secondary | ICD-10-CM | POA: Diagnosis not present

## 2018-03-04 DIAGNOSIS — I5032 Chronic diastolic (congestive) heart failure: Secondary | ICD-10-CM | POA: Diagnosis not present

## 2018-03-04 DIAGNOSIS — I480 Paroxysmal atrial fibrillation: Secondary | ICD-10-CM | POA: Diagnosis not present

## 2018-03-04 DIAGNOSIS — E785 Hyperlipidemia, unspecified: Secondary | ICD-10-CM | POA: Diagnosis not present

## 2018-03-05 DIAGNOSIS — E1122 Type 2 diabetes mellitus with diabetic chronic kidney disease: Secondary | ICD-10-CM | POA: Diagnosis not present

## 2018-03-05 DIAGNOSIS — I480 Paroxysmal atrial fibrillation: Secondary | ICD-10-CM | POA: Diagnosis not present

## 2018-03-05 DIAGNOSIS — E785 Hyperlipidemia, unspecified: Secondary | ICD-10-CM | POA: Diagnosis not present

## 2018-03-05 DIAGNOSIS — N183 Chronic kidney disease, stage 3 (moderate): Secondary | ICD-10-CM | POA: Diagnosis not present

## 2018-03-05 DIAGNOSIS — I5032 Chronic diastolic (congestive) heart failure: Secondary | ICD-10-CM | POA: Diagnosis not present

## 2018-03-05 DIAGNOSIS — I130 Hypertensive heart and chronic kidney disease with heart failure and stage 1 through stage 4 chronic kidney disease, or unspecified chronic kidney disease: Secondary | ICD-10-CM | POA: Diagnosis not present

## 2018-03-06 DIAGNOSIS — I5032 Chronic diastolic (congestive) heart failure: Secondary | ICD-10-CM | POA: Diagnosis not present

## 2018-03-06 DIAGNOSIS — I480 Paroxysmal atrial fibrillation: Secondary | ICD-10-CM | POA: Diagnosis not present

## 2018-03-06 DIAGNOSIS — E1122 Type 2 diabetes mellitus with diabetic chronic kidney disease: Secondary | ICD-10-CM | POA: Diagnosis not present

## 2018-03-06 DIAGNOSIS — N183 Chronic kidney disease, stage 3 (moderate): Secondary | ICD-10-CM | POA: Diagnosis not present

## 2018-03-06 DIAGNOSIS — E785 Hyperlipidemia, unspecified: Secondary | ICD-10-CM | POA: Diagnosis not present

## 2018-03-06 DIAGNOSIS — I130 Hypertensive heart and chronic kidney disease with heart failure and stage 1 through stage 4 chronic kidney disease, or unspecified chronic kidney disease: Secondary | ICD-10-CM | POA: Diagnosis not present

## 2018-03-07 DIAGNOSIS — I130 Hypertensive heart and chronic kidney disease with heart failure and stage 1 through stage 4 chronic kidney disease, or unspecified chronic kidney disease: Secondary | ICD-10-CM | POA: Diagnosis not present

## 2018-03-07 DIAGNOSIS — N183 Chronic kidney disease, stage 3 (moderate): Secondary | ICD-10-CM | POA: Diagnosis not present

## 2018-03-07 DIAGNOSIS — E785 Hyperlipidemia, unspecified: Secondary | ICD-10-CM | POA: Diagnosis not present

## 2018-03-07 DIAGNOSIS — E1122 Type 2 diabetes mellitus with diabetic chronic kidney disease: Secondary | ICD-10-CM | POA: Diagnosis not present

## 2018-03-07 DIAGNOSIS — I5032 Chronic diastolic (congestive) heart failure: Secondary | ICD-10-CM | POA: Diagnosis not present

## 2018-03-07 DIAGNOSIS — I480 Paroxysmal atrial fibrillation: Secondary | ICD-10-CM | POA: Diagnosis not present

## 2018-03-08 ENCOUNTER — Telehealth: Payer: Self-pay

## 2018-03-08 DIAGNOSIS — I480 Paroxysmal atrial fibrillation: Secondary | ICD-10-CM | POA: Diagnosis not present

## 2018-03-08 DIAGNOSIS — I5032 Chronic diastolic (congestive) heart failure: Secondary | ICD-10-CM | POA: Diagnosis not present

## 2018-03-08 DIAGNOSIS — E785 Hyperlipidemia, unspecified: Secondary | ICD-10-CM | POA: Diagnosis not present

## 2018-03-08 DIAGNOSIS — N183 Chronic kidney disease, stage 3 (moderate): Secondary | ICD-10-CM | POA: Diagnosis not present

## 2018-03-08 DIAGNOSIS — E1122 Type 2 diabetes mellitus with diabetic chronic kidney disease: Secondary | ICD-10-CM | POA: Diagnosis not present

## 2018-03-08 DIAGNOSIS — I130 Hypertensive heart and chronic kidney disease with heart failure and stage 1 through stage 4 chronic kidney disease, or unspecified chronic kidney disease: Secondary | ICD-10-CM | POA: Diagnosis not present

## 2018-03-10 ENCOUNTER — Telehealth: Payer: Self-pay

## 2018-03-10 NOTE — Telephone Encounter (Signed)
Copied from [place] [id number]. Topic: General - Deceased Patient >> 2018/03/15 12:15 PM Vernona Rieger wrote: Reason for CRM: [employer] called to let Dr Silvio Pate know that he passed away today at [facility] at 5:45 am. He said that he is mailing the death certificate to be signed by Dr Silvio Pate, just to be on the lookout.   Route to department's PEC Pool.

## 2018-03-10 NOTE — Telephone Encounter (Signed)
In case it doesn't arrive before my vacation--  Vascular dementia-- ~3 years Atrial fibrillation ---     Many years  Other diagnoses---- diabetes mellitus and CHF -diastolic

## 2018-03-10 NOTE — Telephone Encounter (Signed)
I was aware of that and will await the death certificate

## 2018-03-25 NOTE — Telephone Encounter (Signed)
Copied from [place] [id number]. 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 He said that he is mailing the death certificate to be signed by Dr Silvio Pate, just to be on the lookout.   Route to department's PEC Pool.

## 2018-03-25 NOTE — Telephone Encounter (Signed)
I am aware of his death (saw the family this morning) and will be able to fill out the death certificate.

## 2018-03-25 DEATH — deceased
